# Patient Record
Sex: Female | Born: 1945 | Race: White | Hispanic: No | Marital: Married
Health system: Southern US, Community
[De-identification: ages and names within clinical notes are randomized; demographics above are authoritative.]

## PROBLEM LIST (undated history)

## (undated) DIAGNOSIS — M2540 Effusion, unspecified joint: Secondary | ICD-10-CM

## (undated) DIAGNOSIS — I2510 Atherosclerotic heart disease of native coronary artery without angina pectoris: Secondary | ICD-10-CM

## (undated) DIAGNOSIS — R351 Nocturia: Secondary | ICD-10-CM

## (undated) DIAGNOSIS — C189 Malignant neoplasm of colon, unspecified: Secondary | ICD-10-CM

## (undated) DIAGNOSIS — T4145XA Adverse effect of unspecified anesthetic, initial encounter: Secondary | ICD-10-CM

## (undated) DIAGNOSIS — G4730 Sleep apnea, unspecified: Secondary | ICD-10-CM

## (undated) DIAGNOSIS — J449 Chronic obstructive pulmonary disease, unspecified: Secondary | ICD-10-CM

## (undated) DIAGNOSIS — I2699 Other pulmonary embolism without acute cor pulmonale: Secondary | ICD-10-CM

## (undated) DIAGNOSIS — I10 Essential (primary) hypertension: Secondary | ICD-10-CM

## (undated) DIAGNOSIS — L259 Unspecified contact dermatitis, unspecified cause: Secondary | ICD-10-CM

## (undated) DIAGNOSIS — N3289 Other specified disorders of bladder: Secondary | ICD-10-CM

## (undated) DIAGNOSIS — G4700 Insomnia, unspecified: Secondary | ICD-10-CM

## (undated) DIAGNOSIS — F419 Anxiety disorder, unspecified: Secondary | ICD-10-CM

## (undated) DIAGNOSIS — Z8709 Personal history of other diseases of the respiratory system: Secondary | ICD-10-CM

## (undated) DIAGNOSIS — R51 Headache: Secondary | ICD-10-CM

## (undated) DIAGNOSIS — G8929 Other chronic pain: Secondary | ICD-10-CM

## (undated) DIAGNOSIS — Z9289 Personal history of other medical treatment: Secondary | ICD-10-CM

## (undated) DIAGNOSIS — F410 Panic disorder [episodic paroxysmal anxiety] without agoraphobia: Secondary | ICD-10-CM

## (undated) DIAGNOSIS — M1990 Unspecified osteoarthritis, unspecified site: Secondary | ICD-10-CM

## (undated) DIAGNOSIS — R609 Edema, unspecified: Secondary | ICD-10-CM

## (undated) DIAGNOSIS — R011 Cardiac murmur, unspecified: Secondary | ICD-10-CM

## (undated) DIAGNOSIS — R600 Localized edema: Secondary | ICD-10-CM

## (undated) DIAGNOSIS — T8859XA Other complications of anesthesia, initial encounter: Secondary | ICD-10-CM

## (undated) DIAGNOSIS — Z8614 Personal history of Methicillin resistant Staphylococcus aureus infection: Secondary | ICD-10-CM

## (undated) DIAGNOSIS — G2581 Restless legs syndrome: Secondary | ICD-10-CM

## (undated) DIAGNOSIS — R519 Headache, unspecified: Secondary | ICD-10-CM

## (undated) DIAGNOSIS — M549 Dorsalgia, unspecified: Secondary | ICD-10-CM

## (undated) DIAGNOSIS — M2550 Pain in unspecified joint: Secondary | ICD-10-CM

## (undated) DIAGNOSIS — M21372 Foot drop, left foot: Secondary | ICD-10-CM

## (undated) DIAGNOSIS — D649 Anemia, unspecified: Secondary | ICD-10-CM

## (undated) HISTORY — DX: Essential (primary) hypertension: I10

## (undated) HISTORY — DX: Chronic obstructive pulmonary disease, unspecified: J44.9

## (undated) HISTORY — PX: JOINT REPLACEMENT: SHX530

## (undated) HISTORY — PX: COLONOSCOPY: SHX174

## (undated) HISTORY — DX: Sleep apnea, unspecified: G47.30

## (undated) HISTORY — PX: CHOLECYSTECTOMY: SHX55

## (undated) HISTORY — PX: ABDOMINAL HYSTERECTOMY: SHX81

## (undated) HISTORY — PX: OTHER SURGICAL HISTORY: SHX169

## (undated) HISTORY — DX: Anemia, unspecified: D64.9

## (undated) HISTORY — PX: BREAST EXCISIONAL BIOPSY: SUR124

## (undated) HISTORY — PX: BACK SURGERY: SHX140

## (undated) HISTORY — DX: Malignant neoplasm of colon, unspecified: C18.9

## (undated) HISTORY — PX: COLON SURGERY: SHX602

## (undated) HISTORY — PX: TOTAL HIP ARTHROPLASTY: SHX124

---

## 2003-12-16 ENCOUNTER — Other Ambulatory Visit: Payer: Self-pay

## 2004-02-12 ENCOUNTER — Other Ambulatory Visit: Payer: Self-pay

## 2004-10-03 ENCOUNTER — Emergency Department: Payer: Self-pay | Admitting: Emergency Medicine

## 2004-11-05 ENCOUNTER — Ambulatory Visit: Payer: Self-pay | Admitting: Unknown Physician Specialty

## 2004-11-08 ENCOUNTER — Ambulatory Visit: Payer: Self-pay | Admitting: Unknown Physician Specialty

## 2005-09-22 ENCOUNTER — Other Ambulatory Visit: Payer: Self-pay

## 2005-09-22 ENCOUNTER — Emergency Department: Payer: Self-pay | Admitting: Internal Medicine

## 2005-09-24 ENCOUNTER — Ambulatory Visit: Payer: Self-pay | Admitting: Internal Medicine

## 2005-11-19 ENCOUNTER — Inpatient Hospital Stay (HOSPITAL_COMMUNITY): Admission: RE | Admit: 2005-11-19 | Discharge: 2005-11-23 | Payer: Self-pay | Admitting: Orthopaedic Surgery

## 2005-12-17 ENCOUNTER — Ambulatory Visit (HOSPITAL_COMMUNITY): Admission: RE | Admit: 2005-12-17 | Discharge: 2005-12-17 | Payer: Self-pay | Admitting: Orthopaedic Surgery

## 2005-12-17 ENCOUNTER — Encounter: Payer: Self-pay | Admitting: Vascular Surgery

## 2008-01-01 ENCOUNTER — Ambulatory Visit: Payer: Self-pay | Admitting: Unknown Physician Specialty

## 2008-03-13 ENCOUNTER — Emergency Department: Payer: Self-pay | Admitting: Emergency Medicine

## 2008-03-13 ENCOUNTER — Other Ambulatory Visit: Payer: Self-pay

## 2008-07-19 ENCOUNTER — Ambulatory Visit: Payer: Self-pay | Admitting: Family Medicine

## 2008-08-30 ENCOUNTER — Ambulatory Visit: Payer: Self-pay | Admitting: Family Medicine

## 2008-12-06 ENCOUNTER — Emergency Department: Payer: Self-pay | Admitting: Emergency Medicine

## 2009-01-19 ENCOUNTER — Emergency Department: Payer: Self-pay | Admitting: Emergency Medicine

## 2009-01-22 ENCOUNTER — Emergency Department: Payer: Self-pay | Admitting: Emergency Medicine

## 2009-04-04 ENCOUNTER — Ambulatory Visit: Payer: Self-pay | Admitting: Orthopaedic Surgery

## 2009-04-06 ENCOUNTER — Ambulatory Visit: Payer: Self-pay | Admitting: Orthopaedic Surgery

## 2009-08-25 ENCOUNTER — Encounter (INDEPENDENT_AMBULATORY_CARE_PROVIDER_SITE_OTHER): Payer: Self-pay | Admitting: Cardiology

## 2009-08-25 ENCOUNTER — Ambulatory Visit (HOSPITAL_COMMUNITY): Admission: RE | Admit: 2009-08-25 | Discharge: 2009-08-25 | Payer: Self-pay | Admitting: Cardiology

## 2009-08-25 ENCOUNTER — Ambulatory Visit: Payer: Self-pay | Admitting: Vascular Surgery

## 2009-12-19 ENCOUNTER — Emergency Department: Payer: Self-pay | Admitting: Emergency Medicine

## 2009-12-25 ENCOUNTER — Emergency Department: Payer: Self-pay | Admitting: Emergency Medicine

## 2010-01-01 ENCOUNTER — Institutional Professional Consult (permissible substitution): Admission: AD | Admit: 2010-01-01 | Discharge: 2010-01-22 | Payer: Self-pay | Admitting: Internal Medicine

## 2010-01-04 ENCOUNTER — Ambulatory Visit: Payer: Self-pay | Admitting: Internal Medicine

## 2010-01-22 ENCOUNTER — Encounter: Payer: Self-pay | Admitting: Internal Medicine

## 2010-02-05 ENCOUNTER — Encounter: Payer: Self-pay | Admitting: Internal Medicine

## 2010-04-09 ENCOUNTER — Inpatient Hospital Stay: Payer: Self-pay | Admitting: Orthopedic Surgery

## 2010-05-01 ENCOUNTER — Observation Stay: Payer: Self-pay | Admitting: General Practice

## 2010-09-23 LAB — DIFFERENTIAL
Basophils Relative: 0 % (ref 0–1)
Eosinophils Absolute: 0.5 10*3/uL (ref 0.0–0.7)
Lymphocytes Relative: 25 % (ref 12–46)
Lymphs Abs: 1.9 10*3/uL (ref 0.7–4.0)
Monocytes Absolute: 0.6 10*3/uL (ref 0.1–1.0)
Monocytes Relative: 8 % (ref 3–12)
Neutrophils Relative %: 60 % (ref 43–77)

## 2010-09-23 LAB — COMPREHENSIVE METABOLIC PANEL
AST: 18 U/L (ref 0–37)
Albumin: 2.7 g/dL — ABNORMAL LOW (ref 3.5–5.2)
Albumin: 2.6 g/dL — ABNORMAL LOW (ref 3.5–5.2)
Alkaline Phosphatase: 79 U/L (ref 39–117)
BUN: 12 mg/dL (ref 6–23)
BUN: 7 mg/dL (ref 6–23)
CO2: 30 mEq/L (ref 19–32)
Chloride: 101 mEq/L (ref 96–112)
GFR calc Af Amer: >60 mL/min (ref >60)
GFR calc non Af Amer: >60 mL/min (ref >60)
Glucose, Bld: 107 mg/dL — ABNORMAL HIGH (ref 70–99)
Glucose, Bld: 97 mg/dL (ref 70–99)
Potassium: 4.3 mEq/L (ref 3.5–5.1)
Potassium: 3.9 mEq/L (ref 3.5–5.1)
Sodium: 137 mEq/L (ref 135–145)
Total Bilirubin: 0.5 mg/dL (ref 0.3–1.2)
Total Protein: 6.5 g/dL (ref 6.0–8.3)

## 2010-09-23 LAB — PROTIME-INR
INR: 1.12 (ref 0.00–1.49)
INR: 1.16 (ref 0.00–1.49)
Prothrombin Time: 14.3 seconds (ref 11.6–15.2)
Prothrombin Time: 14.7 seconds (ref 11.6–15.2)
Prothrombin Time: 16 seconds — ABNORMAL HIGH (ref 11.6–15.2)

## 2010-09-23 LAB — CBC
HCT: 27.6 % — ABNORMAL LOW (ref 36.0–46.0)
MCHC: 33.4 g/dL (ref 30.0–36.0)
MCV: 89.7 fL (ref 78.0–100.0)
RDW: 14.5 % (ref 11.5–15.5)

## 2010-09-23 LAB — BASIC METABOLIC PANEL
Chloride: 101 mEq/L (ref 96–112)
GFR calc Af Amer: >60 mL/min (ref >60)
Potassium: 5 mEq/L (ref 3.5–5.1)

## 2010-09-23 LAB — IRON AND TIBC: Iron: 26 ug/dL — ABNORMAL LOW (ref 42–135)

## 2010-09-23 LAB — HEMOGLOBIN A1C
Hgb A1c MFr Bld: 5.5 % (ref <5.7)
Mean Plasma Glucose: 111 mg/dL (ref <117)

## 2010-09-23 LAB — FOLATE: Folate: 5.9 ng/mL

## 2010-09-23 LAB — MAGNESIUM: Magnesium: 1.8 mg/dL (ref 1.5–2.5)

## 2010-09-23 LAB — RETICULOCYTES
RBC.: 3.34 MIL/uL — ABNORMAL LOW (ref 3.87–5.11)
Retic Ct Pct: 2.7 % (ref 0.4–3.1)

## 2010-09-23 LAB — PREALBUMIN: Prealbumin: 14.1 mg/dL — ABNORMAL LOW (ref 18.0–45.0)

## 2010-11-23 NOTE — Op Note (Signed)
NAMEJOZEY, JANCO NO.:  [id number]   MEDICAL RECORD NO.:  [id number]          PATIENT TYPE:  INP   LOCATION:  2899                         FACILITY:  [facility]   PHYSICIAN:  Lubertha Basque. Dalldorf, M.D.DATE OF BIRTH:  31-Mar-1946   DATE OF PROCEDURE:  11/19/2005  DATE OF DISCHARGE:                                 OPERATIVE REPORT   PREOPERATIVE DIAGNOSIS:  Left hip degenerative arthritis.   POSTOPERATIVE DIAGNOSIS:  Left hip degenerative arthritis.   PROCEDURE:  Left total hip replacement.   ANESTHESIA:  General.   SURGEON:  Lubertha Basque. Jerl Santos, M.D.   ASSISTANT:  Lindwood Qua, P.A.-C.   INDICATIONS FOR PROCEDURE:  The patient is a 65 year old woman with a long  history of progressively worsening left hip pain.  She is status post a  successful back procedure which has alleviated some significant back  discomfort, but she has been left with left hip and groin pain.  By x-ray,  she has end stage degenerative change.  She has pain which limits her  ability to rest and walk and she has failed oral anti-inflammatories and  rest.  She is offered a hip replacement operation.  Informed operative  consent was obtained after a discussion of the possible complications of  reaction to anesthesia, infection, DVT, PE, dislocation, and death.   SUMMARY:  Through a posterior approach, a left total hip replacement was  performed.  She had excellent bone quality and we performed a porous  ingrowth replacement using the DePuy ASR System.  I placed a 4 high offset  Summit stem in appropriate anteversion with a size 46 ASR DePuy cup in  appropriate anteversion and tilt.  We then used a +2 taper with a 41 mm ASR  cobalt chrome head.  Bryna Colander assisted throughout with retraction and  closure and helped to significantly minimize surgical time.   DESCRIPTION OF PROCEDURE:  The patient went to the operating suite where  general anesthetic was applied without difficulty.  She  was positioned in  the lateral decubitus position with left the hip up.  Hip positioners were  used and an axillary roll was placed.  All bony prominences were  appropriately padded.  She was then prepped and draped in a normal sterile  fashion.  After the administration of preop IV Kefzol, a posterior approach  was taken to the left hip.  All appropriate anti-infective measures were  used including the preoperative IV antibiotic, Betadine impregnated drape,  and closed hooded exhaust systems for each member of the surgical team.  Dissection was carried through an abundance of adipose tissue to the IT band  which was incised longitudinally along with the fascia of the gluteus  maximus.  A deep Charnley was placed followed by reflection of the short  external rotators which were tagged.  A posterior capsulectomy was performed  and the hip was dislocated.  She had a fairly short femoral neck.  A neck  cut was made with an oscillating saw just above the lesser trochanter.  The  acetabulum was then fully exposed.  She had a small  hip joint and we took  medial reaming with a 43 reamer to the inside wall of the pelvis.  I then  sequentially reamed up to 45 at which point she had good bleeding bone and  good coverage of the reamer.  We elected to use the ASR System.  I placed a  size 46 DePuy ASR cup in appropriate anteversion and tilt.  We then turned  our attention towards the femur.  This was exposed completely.  I used a  lateralizing reamer and reamed up to a size 4 followed by broaching to a  size 4.  We then trialed up this stem with various components and the +2  high offset assembly seemed to be provide [place] the best stability and equality  of leg length.  The trial component was removed followed by placement of a  size 4 high offset Summit DePuy stem in appropriate anteversion.  We then  placed a +2 taper and a 41 mm ASR cobalt chrome head by DePuy.  The hip was  again reduced and was  stable in extension with external rotation and flexion  with internal rotation.  Her leg lengths were judged to be roughly equal.  The wound was irrigated followed by reapproximation of the short external  rotators to the greater trochanteric region with nonabsorbable suture.  The  IT band and gluteus maximus fascia were then reapproximated with #1 Vicryl  in an interrupted fashion.  Adipose was reapproximated in several layers to  eliminate dead space and subcutaneous tissues were brought together with 2-0  undyed Vicryl.  The skin was closed with staples followed by Adaptic with a  dry gauze dressing and tape.  Estimated blood loss was 150cc and  interoperative fluids can be obtained from anesthesia records.   DISPOSITION:  The patient was extubated in the operating room and taken to  the recovery room in stable addition.  The plans were for her to be admitted  to the orthopedic surgery service for appropriate postop care to include  perioperative antibiotics and Coumadin plus Lovenox for DVT prophylaxis.      Lubertha Basque Jerl Santos, M.D.  Electronically Signed     PGD/MEDQ  D:  11/19/2005  T:  11/19/2005  Job:  [id number]

## 2010-11-23 NOTE — Discharge Summary (Signed)
Ann Silva, Ann Silva NO.:  [id number]   MEDICAL RECORD NO.:  [id number]          PATIENT TYPE:  INP   LOCATION:  5020                         FACILITY:  [facility]   PHYSICIAN:  Ann Basque. Dalldorf, M.D.DATE OF BIRTH:  1946/06/29   DATE OF ADMISSION:  11/19/2005  DATE OF DISCHARGE:  11/23/2005                                 DISCHARGE SUMMARY   ADMISSION DIAGNOSES:  1.  Left hip end-stage degenerative joint disease.  2.  Hypertension.  3.  History of depression.   DISCHARGE DIAGNOSES:  1.  Left hip end-stage degenerative joint disease.  2.  Hypertension.  3.  History of depression.   OPERATION:  Left total hip replacement.   HISTORY:  Briefly, this patient is a 65 year old white female patient, well-  known to our practice, who is having worsening left hip pain.  She is having  almost no rotation, painful ambulation and trouble sleeping at nighttime.  X-  rays revealed end-stage DJD of her left hip.  We have discussed treatment  option, that being a left total hip replacement, the risks of anesthesia,  infection, DVT and possible death.   LABORATORY DATA:  Pertinent laboratory and x-ray findings:  EKG showed  normal sinus rhythm.  White blood cell count is 8.5, hemoglobin 13.8 with a  drop to 9.3 postoperatively, hematocrit 27.1, platelet count at 220,000.  INRs done serially showed on low-dose Coumadin protocol last testing 2.1.  Sodium 135, potassium fluctuated, both 3.5 down to 3.2, glucose 107, BUN 5  and creatinine 0.6, calcium 8.0.   HOSPITAL COURSE:  She was admitted postoperatively, was on Ancef 1 gram  times three doses, lactated Ringer's IV, PCA morphine pump, and on  appropriate laxatives, and then Coumadin protocol and Lovenox protocol for  DVT prophylaxis.  Her medicine reconciliation sheet was also signed and she  was kept on her home medications, which will be outlined later, Percocet by  mouth for pain, Reglan for nausea.  She was able to be up  and out of bed,  touchdown weightbearing with the help of physical therapy.  Knee immobilizer  on at nighttime, knee-high stockings and incentive spirometry was also  ordered.  The first day post-op, her blood pressure was 97/64, temperature  was 102, hemoglobin 11.1, potassium 3.3, INR 1.0.  She had positive breath  sounds.  Abdomen was soft.  Hip wound was benign.  We were going to continue  her IV fluids, get her up and out of bed with physical therapy to be  touchdown weightbearing.  On the second day post-op, her blood pressure was  better at 129/76.  Abdomen continued soft.  Lungs continued clear.  Her  dressing was changed.  Her wound was benign.  No signs of infection or  irritation and she was progressing well, getting up and out of bed.  Having  a little hip pain.  She was discharged home on Saturday, Nov 23, 2005.   CONDITION ON DISCHARGE:  Improved.   FOLLOW UP:  She will remain on her home medications which are:  1.  Premarin 0.9 one a  day.  2.  Potassium chloride 30 mEq daily.  3.  Zoloft 100 mg daily.  4.  Tri-HCT 525 one daily.   Additional medications would be:  1.  Percocet one or two every four to six hours p.r.n. pain.  2.  Coumadin 2 mg by mouth daily until [facility] draws a pro-time      and possibly adjusts that.  This Coumadin protocol will last for 30      days.   Dressing can be changed every two to three days at home.  If any signs of  infection, she is to call our office at [phone].  Diet is unrestricted  other than coumadin considerations.  She is to be touchdown weightbearing  with crutches or walker.  Once again, [facility] will provide home  physical therapy and blood draws.      Ann Silva, P.A.      Ann Silva, M.D.  Electronically Signed    MC/MEDQ  D:  01/07/2006  T:  01/07/2006  Job:  [id number]

## 2011-03-26 ENCOUNTER — Emergency Department: Payer: Self-pay | Admitting: Emergency Medicine

## 2011-04-02 ENCOUNTER — Inpatient Hospital Stay: Payer: Self-pay | Admitting: Internal Medicine

## 2011-04-16 ENCOUNTER — Encounter: Payer: Self-pay | Admitting: Internal Medicine

## 2011-04-26 ENCOUNTER — Emergency Department: Payer: Self-pay | Admitting: Emergency Medicine

## 2011-05-09 ENCOUNTER — Encounter: Payer: Self-pay | Admitting: Internal Medicine

## 2011-06-08 ENCOUNTER — Encounter: Payer: Self-pay | Admitting: Internal Medicine

## 2011-07-09 ENCOUNTER — Encounter: Payer: Self-pay | Admitting: Internal Medicine

## 2011-07-24 DIAGNOSIS — Z9889 Other specified postprocedural states: Secondary | ICD-10-CM | POA: Diagnosis not present

## 2011-07-24 DIAGNOSIS — Z90710 Acquired absence of both cervix and uterus: Secondary | ICD-10-CM | POA: Diagnosis not present

## 2011-07-24 DIAGNOSIS — Z882 Allergy status to sulfonamides status: Secondary | ICD-10-CM | POA: Diagnosis not present

## 2011-07-24 DIAGNOSIS — M25559 Pain in unspecified hip: Secondary | ICD-10-CM | POA: Diagnosis not present

## 2011-07-24 DIAGNOSIS — D649 Anemia, unspecified: Secondary | ICD-10-CM | POA: Diagnosis not present

## 2011-07-24 DIAGNOSIS — R52 Pain, unspecified: Secondary | ICD-10-CM | POA: Diagnosis not present

## 2011-07-24 DIAGNOSIS — M7989 Other specified soft tissue disorders: Secondary | ICD-10-CM | POA: Diagnosis not present

## 2011-07-24 DIAGNOSIS — S73006A Unspecified dislocation of unspecified hip, initial encounter: Secondary | ICD-10-CM | POA: Diagnosis not present

## 2011-07-24 DIAGNOSIS — I10 Essential (primary) hypertension: Secondary | ICD-10-CM | POA: Diagnosis not present

## 2011-07-24 DIAGNOSIS — G2581 Restless legs syndrome: Secondary | ICD-10-CM | POA: Diagnosis not present

## 2011-07-24 DIAGNOSIS — Z8619 Personal history of other infectious and parasitic diseases: Secondary | ICD-10-CM | POA: Diagnosis not present

## 2011-07-24 DIAGNOSIS — M79609 Pain in unspecified limb: Secondary | ICD-10-CM | POA: Diagnosis not present

## 2011-07-24 DIAGNOSIS — Z981 Arthrodesis status: Secondary | ICD-10-CM | POA: Diagnosis not present

## 2011-07-24 DIAGNOSIS — G8929 Other chronic pain: Secondary | ICD-10-CM | POA: Diagnosis not present

## 2011-07-24 DIAGNOSIS — Z79899 Other long term (current) drug therapy: Secondary | ICD-10-CM | POA: Diagnosis not present

## 2011-07-24 DIAGNOSIS — Z8249 Family history of ischemic heart disease and other diseases of the circulatory system: Secondary | ICD-10-CM | POA: Diagnosis not present

## 2011-07-24 DIAGNOSIS — N390 Urinary tract infection, site not specified: Secondary | ICD-10-CM | POA: Diagnosis not present

## 2011-07-24 DIAGNOSIS — Z885 Allergy status to narcotic agent status: Secondary | ICD-10-CM | POA: Diagnosis not present

## 2011-07-24 DIAGNOSIS — Z888 Allergy status to other drugs, medicaments and biological substances status: Secondary | ICD-10-CM | POA: Diagnosis not present

## 2011-07-24 DIAGNOSIS — R079 Chest pain, unspecified: Secondary | ICD-10-CM | POA: Diagnosis not present

## 2011-07-25 ENCOUNTER — Observation Stay: Payer: Self-pay | Admitting: Internal Medicine

## 2011-07-25 DIAGNOSIS — M25559 Pain in unspecified hip: Secondary | ICD-10-CM | POA: Diagnosis not present

## 2011-07-25 DIAGNOSIS — M79609 Pain in unspecified limb: Secondary | ICD-10-CM | POA: Diagnosis not present

## 2011-07-25 DIAGNOSIS — I10 Essential (primary) hypertension: Secondary | ICD-10-CM | POA: Diagnosis not present

## 2011-07-25 DIAGNOSIS — N390 Urinary tract infection, site not specified: Secondary | ICD-10-CM | POA: Diagnosis not present

## 2011-07-25 DIAGNOSIS — R079 Chest pain, unspecified: Secondary | ICD-10-CM | POA: Diagnosis not present

## 2011-07-25 LAB — URINALYSIS, COMPLETE
Bilirubin,UR: NEGATIVE
Ketone: NEGATIVE
Nitrite: POSITIVE
Protein: NEGATIVE
RBC,UR: 15 /HPF (ref 0–5)
Specific Gravity: 1.02 (ref 1.003–1.030)
Squamous Epithelial: 2
WBC UR: 22 /HPF (ref 0–5)

## 2011-07-25 LAB — CBC WITH DIFFERENTIAL/PLATELET
Basophil #: 0 10*3/uL (ref 0.0–0.1)
Eosinophil #: 0.4 10*3/uL (ref 0.0–0.7)
Eosinophil %: 6.5 %
Lymphocyte #: 1.8 10*3/uL (ref 1.0–3.6)
MCH: 28.6 pg (ref 26.0–34.0)
MCV: 87 fL (ref 80–100)
Monocyte #: 0.4 10*3/uL (ref 0.0–0.7)
Monocyte %: 6.4 %
Neutrophil #: 4 10*3/uL (ref 1.4–6.5)
Neutrophil %: 59.4 %
Platelet: 195 10*3/uL (ref 150–440)
RDW: 17.1 % — ABNORMAL HIGH (ref 11.5–14.5)
WBC: 6.8 10*3/uL (ref 3.6–11.0)

## 2011-07-25 LAB — COMPREHENSIVE METABOLIC PANEL
Albumin: 3.5 g/dL (ref 3.4–5.0)
Anion Gap: 18 — ABNORMAL HIGH (ref 7–16)
BUN: 23 mg/dL — ABNORMAL HIGH (ref 7–18)
Bilirubin,Total: 0.6 mg/dL (ref 0.2–1.0)
Chloride: 100 mmol/L (ref 98–107)
Glucose: 135 mg/dL — ABNORMAL HIGH (ref 65–99)
Potassium: 3.9 mmol/L (ref 3.5–5.1)
SGOT(AST): 24 U/L (ref 15–37)
SGPT (ALT): 21 U/L
Sodium: 143 mmol/L (ref 136–145)
Total Protein: 7.2 g/dL (ref 6.4–8.2)

## 2011-07-25 LAB — TROPONIN I: Troponin-I: <0.02 ng/mL

## 2011-07-26 DIAGNOSIS — D649 Anemia, unspecified: Secondary | ICD-10-CM | POA: Diagnosis not present

## 2011-07-26 DIAGNOSIS — M79609 Pain in unspecified limb: Secondary | ICD-10-CM | POA: Diagnosis not present

## 2011-07-26 DIAGNOSIS — N390 Urinary tract infection, site not specified: Secondary | ICD-10-CM | POA: Diagnosis not present

## 2011-07-26 DIAGNOSIS — M25559 Pain in unspecified hip: Secondary | ICD-10-CM | POA: Diagnosis not present

## 2011-07-26 LAB — BASIC METABOLIC PANEL
Anion Gap: 8 (ref 7–16)
Calcium, Total: 9.6 mg/dL (ref 8.5–10.1)
Chloride: 105 mmol/L (ref 98–107)
Co2: 28 mmol/L (ref 21–32)
Glucose: 106 mg/dL — ABNORMAL HIGH (ref 65–99)
Osmolality: 285 (ref 275–301)
Potassium: 3.7 mmol/L (ref 3.5–5.1)
Sodium: 141 mmol/L (ref 136–145)

## 2011-07-26 LAB — CBC WITH DIFFERENTIAL/PLATELET
Basophil #: 0 10*3/uL (ref 0.0–0.1)
Basophil %: 0.5 %
Eosinophil #: 0.5 10*3/uL (ref 0.0–0.7)
HCT: 35.2 % (ref 35.0–47.0)
MCH: 28.4 pg (ref 26.0–34.0)
MCV: 86 fL (ref 80–100)
Monocyte #: 0.2 10*3/uL (ref 0.0–0.7)
Neutrophil #: 3.1 10*3/uL (ref 1.4–6.5)
Platelet: 171 10*3/uL (ref 150–440)
RDW: 17 % — ABNORMAL HIGH (ref 11.5–14.5)
WBC: 5 10*3/uL (ref 3.6–11.0)

## 2011-07-29 DIAGNOSIS — M25459 Effusion, unspecified hip: Secondary | ICD-10-CM | POA: Diagnosis not present

## 2011-07-29 DIAGNOSIS — T8489XA Other specified complication of internal orthopedic prosthetic devices, implants and grafts, initial encounter: Secondary | ICD-10-CM | POA: Diagnosis not present

## 2011-07-29 DIAGNOSIS — M25559 Pain in unspecified hip: Secondary | ICD-10-CM | POA: Diagnosis not present

## 2011-07-30 DIAGNOSIS — M25459 Effusion, unspecified hip: Secondary | ICD-10-CM | POA: Diagnosis not present

## 2011-07-30 DIAGNOSIS — T8489XA Other specified complication of internal orthopedic prosthetic devices, implants and grafts, initial encounter: Secondary | ICD-10-CM | POA: Diagnosis not present

## 2011-07-31 DIAGNOSIS — M7989 Other specified soft tissue disorders: Secondary | ICD-10-CM | POA: Diagnosis not present

## 2011-08-01 DIAGNOSIS — T8450XA Infection and inflammatory reaction due to unspecified internal joint prosthesis, initial encounter: Secondary | ICD-10-CM | POA: Diagnosis not present

## 2011-08-09 ENCOUNTER — Encounter: Payer: Self-pay | Admitting: Internal Medicine

## 2011-08-14 DIAGNOSIS — Z01818 Encounter for other preprocedural examination: Secondary | ICD-10-CM | POA: Diagnosis not present

## 2011-08-14 DIAGNOSIS — T84498A Other mechanical complication of other internal orthopedic devices, implants and grafts, initial encounter: Secondary | ICD-10-CM | POA: Diagnosis not present

## 2011-08-22 DIAGNOSIS — Z885 Allergy status to narcotic agent status: Secondary | ICD-10-CM | POA: Diagnosis not present

## 2011-08-22 DIAGNOSIS — Z882 Allergy status to sulfonamides status: Secondary | ICD-10-CM | POA: Diagnosis not present

## 2011-08-22 DIAGNOSIS — I2789 Other specified pulmonary heart diseases: Secondary | ICD-10-CM | POA: Diagnosis present

## 2011-08-22 DIAGNOSIS — R209 Unspecified disturbances of skin sensation: Secondary | ICD-10-CM | POA: Diagnosis present

## 2011-08-22 DIAGNOSIS — D62 Acute posthemorrhagic anemia: Secondary | ICD-10-CM | POA: Diagnosis not present

## 2011-08-22 DIAGNOSIS — G4733 Obstructive sleep apnea (adult) (pediatric): Secondary | ICD-10-CM | POA: Diagnosis present

## 2011-08-22 DIAGNOSIS — G2581 Restless legs syndrome: Secondary | ICD-10-CM | POA: Diagnosis present

## 2011-08-22 DIAGNOSIS — E669 Obesity, unspecified: Secondary | ICD-10-CM | POA: Diagnosis not present

## 2011-08-22 DIAGNOSIS — D649 Anemia, unspecified: Secondary | ICD-10-CM | POA: Diagnosis not present

## 2011-08-22 DIAGNOSIS — Z5189 Encounter for other specified aftercare: Secondary | ICD-10-CM | POA: Diagnosis not present

## 2011-08-22 DIAGNOSIS — M25559 Pain in unspecified hip: Secondary | ICD-10-CM | POA: Diagnosis not present

## 2011-08-22 DIAGNOSIS — Z96649 Presence of unspecified artificial hip joint: Secondary | ICD-10-CM | POA: Diagnosis not present

## 2011-08-22 DIAGNOSIS — Z471 Aftercare following joint replacement surgery: Secondary | ICD-10-CM | POA: Diagnosis not present

## 2011-08-22 DIAGNOSIS — G8918 Other acute postprocedural pain: Secondary | ICD-10-CM | POA: Diagnosis not present

## 2011-08-22 DIAGNOSIS — K219 Gastro-esophageal reflux disease without esophagitis: Secondary | ICD-10-CM | POA: Diagnosis present

## 2011-08-22 DIAGNOSIS — M11859 Other specified crystal arthropathies, unspecified hip: Secondary | ICD-10-CM | POA: Diagnosis not present

## 2011-08-22 DIAGNOSIS — R269 Unspecified abnormalities of gait and mobility: Secondary | ICD-10-CM | POA: Diagnosis not present

## 2011-08-22 DIAGNOSIS — M21859 Other specified acquired deformities of unspecified thigh: Secondary | ICD-10-CM | POA: Diagnosis present

## 2011-08-22 DIAGNOSIS — Z87891 Personal history of nicotine dependence: Secondary | ICD-10-CM | POA: Diagnosis not present

## 2011-08-22 DIAGNOSIS — R5381 Other malaise: Secondary | ICD-10-CM | POA: Diagnosis not present

## 2011-08-22 DIAGNOSIS — Z6834 Body mass index (BMI) 34.0-34.9, adult: Secondary | ICD-10-CM | POA: Diagnosis not present

## 2011-08-22 DIAGNOSIS — Z9889 Other specified postprocedural states: Secondary | ICD-10-CM | POA: Diagnosis not present

## 2011-08-22 DIAGNOSIS — S72143A Displaced intertrochanteric fracture of unspecified femur, initial encounter for closed fracture: Secondary | ICD-10-CM | POA: Diagnosis not present

## 2011-08-22 DIAGNOSIS — Z981 Arthrodesis status: Secondary | ICD-10-CM | POA: Diagnosis not present

## 2011-08-22 DIAGNOSIS — I10 Essential (primary) hypertension: Secondary | ICD-10-CM | POA: Diagnosis not present

## 2011-08-22 DIAGNOSIS — T8450XA Infection and inflammatory reaction due to unspecified internal joint prosthesis, initial encounter: Secondary | ICD-10-CM | POA: Diagnosis not present

## 2011-08-22 DIAGNOSIS — N318 Other neuromuscular dysfunction of bladder: Secondary | ICD-10-CM | POA: Diagnosis not present

## 2011-08-22 DIAGNOSIS — I9589 Other hypotension: Secondary | ICD-10-CM | POA: Diagnosis not present

## 2011-08-22 DIAGNOSIS — M6281 Muscle weakness (generalized): Secondary | ICD-10-CM | POA: Diagnosis not present

## 2011-08-22 DIAGNOSIS — A4901 Methicillin susceptible Staphylococcus aureus infection, unspecified site: Secondary | ICD-10-CM | POA: Diagnosis not present

## 2011-08-22 DIAGNOSIS — Z85038 Personal history of other malignant neoplasm of large intestine: Secondary | ICD-10-CM | POA: Diagnosis not present

## 2011-08-22 DIAGNOSIS — F410 Panic disorder [episodic paroxysmal anxiety] without agoraphobia: Secondary | ICD-10-CM | POA: Diagnosis present

## 2011-08-27 DIAGNOSIS — N318 Other neuromuscular dysfunction of bladder: Secondary | ICD-10-CM | POA: Diagnosis not present

## 2011-08-27 DIAGNOSIS — G2581 Restless legs syndrome: Secondary | ICD-10-CM | POA: Diagnosis not present

## 2011-08-27 DIAGNOSIS — I10 Essential (primary) hypertension: Secondary | ICD-10-CM | POA: Diagnosis not present

## 2011-08-27 DIAGNOSIS — Z96649 Presence of unspecified artificial hip joint: Secondary | ICD-10-CM | POA: Diagnosis not present

## 2011-08-27 DIAGNOSIS — D649 Anemia, unspecified: Secondary | ICD-10-CM | POA: Diagnosis not present

## 2011-08-27 DIAGNOSIS — Z471 Aftercare following joint replacement surgery: Secondary | ICD-10-CM | POA: Diagnosis not present

## 2011-08-27 DIAGNOSIS — Z5189 Encounter for other specified aftercare: Secondary | ICD-10-CM | POA: Diagnosis not present

## 2011-08-27 DIAGNOSIS — R5381 Other malaise: Secondary | ICD-10-CM | POA: Diagnosis not present

## 2011-08-27 DIAGNOSIS — M1909 Primary osteoarthritis, other specified site: Secondary | ICD-10-CM | POA: Diagnosis not present

## 2011-08-27 DIAGNOSIS — R269 Unspecified abnormalities of gait and mobility: Secondary | ICD-10-CM | POA: Diagnosis not present

## 2011-08-27 DIAGNOSIS — M6281 Muscle weakness (generalized): Secondary | ICD-10-CM | POA: Diagnosis not present

## 2011-08-27 DIAGNOSIS — M25559 Pain in unspecified hip: Secondary | ICD-10-CM | POA: Diagnosis not present

## 2011-08-29 DIAGNOSIS — M1909 Primary osteoarthritis, other specified site: Secondary | ICD-10-CM | POA: Diagnosis not present

## 2011-08-29 DIAGNOSIS — I10 Essential (primary) hypertension: Secondary | ICD-10-CM | POA: Diagnosis not present

## 2011-08-29 LAB — PROTIME-INR
INR: 1.7
Prothrombin Time: 20.2 secs — ABNORMAL HIGH (ref 11.5–14.7)

## 2011-09-06 ENCOUNTER — Encounter: Payer: Self-pay | Admitting: Internal Medicine

## 2011-09-08 DIAGNOSIS — D649 Anemia, unspecified: Secondary | ICD-10-CM | POA: Diagnosis not present

## 2011-09-08 DIAGNOSIS — F3289 Other specified depressive episodes: Secondary | ICD-10-CM | POA: Diagnosis not present

## 2011-09-08 DIAGNOSIS — Z4682 Encounter for fitting and adjustment of non-vascular catheter: Secondary | ICD-10-CM | POA: Diagnosis not present

## 2011-09-08 DIAGNOSIS — Z96649 Presence of unspecified artificial hip joint: Secondary | ICD-10-CM | POA: Diagnosis not present

## 2011-09-08 DIAGNOSIS — F329 Major depressive disorder, single episode, unspecified: Secondary | ICD-10-CM | POA: Diagnosis not present

## 2011-09-08 DIAGNOSIS — Z7901 Long term (current) use of anticoagulants: Secondary | ICD-10-CM | POA: Diagnosis not present

## 2011-09-11 DIAGNOSIS — Z7901 Long term (current) use of anticoagulants: Secondary | ICD-10-CM | POA: Diagnosis not present

## 2011-09-11 DIAGNOSIS — Z4682 Encounter for fitting and adjustment of non-vascular catheter: Secondary | ICD-10-CM | POA: Diagnosis not present

## 2011-09-11 DIAGNOSIS — D649 Anemia, unspecified: Secondary | ICD-10-CM | POA: Diagnosis not present

## 2011-09-11 DIAGNOSIS — F329 Major depressive disorder, single episode, unspecified: Secondary | ICD-10-CM | POA: Diagnosis not present

## 2011-09-11 DIAGNOSIS — Z96649 Presence of unspecified artificial hip joint: Secondary | ICD-10-CM | POA: Diagnosis not present

## 2011-09-11 DIAGNOSIS — F3289 Other specified depressive episodes: Secondary | ICD-10-CM | POA: Diagnosis not present

## 2011-09-16 DIAGNOSIS — T8450XA Infection and inflammatory reaction due to unspecified internal joint prosthesis, initial encounter: Secondary | ICD-10-CM | POA: Diagnosis not present

## 2011-09-17 DIAGNOSIS — Z7901 Long term (current) use of anticoagulants: Secondary | ICD-10-CM | POA: Diagnosis not present

## 2011-09-17 DIAGNOSIS — Z96649 Presence of unspecified artificial hip joint: Secondary | ICD-10-CM | POA: Diagnosis not present

## 2011-09-17 DIAGNOSIS — Z4682 Encounter for fitting and adjustment of non-vascular catheter: Secondary | ICD-10-CM | POA: Diagnosis not present

## 2011-09-17 DIAGNOSIS — F3289 Other specified depressive episodes: Secondary | ICD-10-CM | POA: Diagnosis not present

## 2011-09-17 DIAGNOSIS — F329 Major depressive disorder, single episode, unspecified: Secondary | ICD-10-CM | POA: Diagnosis not present

## 2011-09-17 DIAGNOSIS — D649 Anemia, unspecified: Secondary | ICD-10-CM | POA: Diagnosis not present

## 2011-09-18 DIAGNOSIS — Z7901 Long term (current) use of anticoagulants: Secondary | ICD-10-CM | POA: Diagnosis not present

## 2011-09-18 DIAGNOSIS — F329 Major depressive disorder, single episode, unspecified: Secondary | ICD-10-CM | POA: Diagnosis not present

## 2011-09-18 DIAGNOSIS — D649 Anemia, unspecified: Secondary | ICD-10-CM | POA: Diagnosis not present

## 2011-09-18 DIAGNOSIS — Z4682 Encounter for fitting and adjustment of non-vascular catheter: Secondary | ICD-10-CM | POA: Diagnosis not present

## 2011-09-18 DIAGNOSIS — F3289 Other specified depressive episodes: Secondary | ICD-10-CM | POA: Diagnosis not present

## 2011-09-18 DIAGNOSIS — Z96649 Presence of unspecified artificial hip joint: Secondary | ICD-10-CM | POA: Diagnosis not present

## 2011-09-23 ENCOUNTER — Emergency Department: Payer: Self-pay | Admitting: Emergency Medicine

## 2011-09-23 DIAGNOSIS — R109 Unspecified abdominal pain: Secondary | ICD-10-CM | POA: Diagnosis not present

## 2011-09-23 DIAGNOSIS — R609 Edema, unspecified: Secondary | ICD-10-CM | POA: Diagnosis not present

## 2011-09-23 DIAGNOSIS — M79609 Pain in unspecified limb: Secondary | ICD-10-CM | POA: Diagnosis not present

## 2011-09-23 DIAGNOSIS — M7989 Other specified soft tissue disorders: Secondary | ICD-10-CM | POA: Diagnosis not present

## 2011-09-23 LAB — URINALYSIS, COMPLETE
Bilirubin,UR: NEGATIVE
Glucose,UR: NEGATIVE mg/dL (ref 0–75)
Ketone: NEGATIVE
Nitrite: NEGATIVE
Ph: 5 (ref 4.5–8.0)
Protein: NEGATIVE
Specific Gravity: 1.021 (ref 1.003–1.030)
WBC UR: 1 /HPF (ref 0–5)

## 2011-09-23 LAB — COMPREHENSIVE METABOLIC PANEL
Albumin: 4.2 g/dL (ref 3.4–5.0)
Alkaline Phosphatase: 102 U/L (ref 50–136)
BUN: 23 mg/dL — ABNORMAL HIGH (ref 7–18)
Bilirubin,Total: 0.5 mg/dL (ref 0.2–1.0)
Calcium, Total: 10.3 mg/dL — ABNORMAL HIGH (ref 8.5–10.1)
Chloride: 104 mmol/L (ref 98–107)
Co2: 26 mmol/L (ref 21–32)
Creatinine: 0.55 mg/dL — ABNORMAL LOW (ref 0.60–1.30)
EGFR (African American): >60
EGFR (Non-African Amer.): >60
Potassium: 4 mmol/L (ref 3.5–5.1)
SGOT(AST): 32 U/L (ref 15–37)
SGPT (ALT): 18 U/L
Total Protein: 7.9 g/dL (ref 6.4–8.2)

## 2011-09-23 LAB — CBC
HCT: 32.3 % — ABNORMAL LOW (ref 35.0–47.0)
MCHC: 33.3 g/dL (ref 32.0–36.0)
Platelet: 201 10*3/uL (ref 150–440)
RDW: 16.1 % — ABNORMAL HIGH (ref 11.5–14.5)

## 2011-09-24 DIAGNOSIS — Z96649 Presence of unspecified artificial hip joint: Secondary | ICD-10-CM | POA: Diagnosis not present

## 2011-09-24 DIAGNOSIS — Z7901 Long term (current) use of anticoagulants: Secondary | ICD-10-CM | POA: Diagnosis not present

## 2011-09-24 DIAGNOSIS — Z4682 Encounter for fitting and adjustment of non-vascular catheter: Secondary | ICD-10-CM | POA: Diagnosis not present

## 2011-09-24 DIAGNOSIS — F329 Major depressive disorder, single episode, unspecified: Secondary | ICD-10-CM | POA: Diagnosis not present

## 2011-09-24 DIAGNOSIS — D649 Anemia, unspecified: Secondary | ICD-10-CM | POA: Diagnosis not present

## 2011-09-24 DIAGNOSIS — F3289 Other specified depressive episodes: Secondary | ICD-10-CM | POA: Diagnosis not present

## 2011-09-29 LAB — CULTURE, BLOOD (SINGLE)

## 2011-10-03 DIAGNOSIS — Z4682 Encounter for fitting and adjustment of non-vascular catheter: Secondary | ICD-10-CM | POA: Diagnosis not present

## 2011-10-03 DIAGNOSIS — Z7901 Long term (current) use of anticoagulants: Secondary | ICD-10-CM | POA: Diagnosis not present

## 2011-10-03 DIAGNOSIS — F3289 Other specified depressive episodes: Secondary | ICD-10-CM | POA: Diagnosis not present

## 2011-10-03 DIAGNOSIS — F329 Major depressive disorder, single episode, unspecified: Secondary | ICD-10-CM | POA: Diagnosis not present

## 2011-10-03 DIAGNOSIS — D649 Anemia, unspecified: Secondary | ICD-10-CM | POA: Diagnosis not present

## 2011-10-03 DIAGNOSIS — Z96649 Presence of unspecified artificial hip joint: Secondary | ICD-10-CM | POA: Diagnosis not present

## 2011-10-07 ENCOUNTER — Encounter: Payer: Self-pay | Admitting: Internal Medicine

## 2011-10-16 ENCOUNTER — Emergency Department: Payer: Self-pay | Admitting: Emergency Medicine

## 2011-10-16 DIAGNOSIS — Z888 Allergy status to other drugs, medicaments and biological substances status: Secondary | ICD-10-CM | POA: Diagnosis not present

## 2011-10-16 DIAGNOSIS — S79919A Unspecified injury of unspecified hip, initial encounter: Secondary | ICD-10-CM | POA: Diagnosis not present

## 2011-10-16 DIAGNOSIS — M7989 Other specified soft tissue disorders: Secondary | ICD-10-CM | POA: Diagnosis not present

## 2011-10-16 DIAGNOSIS — I10 Essential (primary) hypertension: Secondary | ICD-10-CM | POA: Diagnosis not present

## 2011-10-16 DIAGNOSIS — L03119 Cellulitis of unspecified part of limb: Secondary | ICD-10-CM | POA: Diagnosis not present

## 2011-10-16 DIAGNOSIS — Z882 Allergy status to sulfonamides status: Secondary | ICD-10-CM | POA: Diagnosis not present

## 2011-10-16 DIAGNOSIS — L02419 Cutaneous abscess of limb, unspecified: Secondary | ICD-10-CM | POA: Diagnosis not present

## 2011-10-16 DIAGNOSIS — M25559 Pain in unspecified hip: Secondary | ICD-10-CM | POA: Diagnosis not present

## 2011-10-16 DIAGNOSIS — R6889 Other general symptoms and signs: Secondary | ICD-10-CM | POA: Diagnosis not present

## 2011-10-16 DIAGNOSIS — Z96649 Presence of unspecified artificial hip joint: Secondary | ICD-10-CM | POA: Diagnosis not present

## 2011-10-16 DIAGNOSIS — Z79899 Other long term (current) drug therapy: Secondary | ICD-10-CM | POA: Diagnosis not present

## 2011-10-16 DIAGNOSIS — S72009A Fracture of unspecified part of neck of unspecified femur, initial encounter for closed fracture: Secondary | ICD-10-CM | POA: Diagnosis not present

## 2011-10-16 LAB — COMPREHENSIVE METABOLIC PANEL
Albumin: 3.8 g/dL (ref 3.4–5.0)
Alkaline Phosphatase: 113 U/L (ref 50–136)
Anion Gap: 10 (ref 7–16)
BUN: 28 mg/dL — ABNORMAL HIGH (ref 7–18)
Chloride: 103 mmol/L (ref 98–107)
Osmolality: 287 (ref 275–301)
Potassium: 3.6 mmol/L (ref 3.5–5.1)
SGOT(AST): 31 U/L (ref 15–37)
SGPT (ALT): 17 U/L

## 2011-10-16 LAB — CBC WITH DIFFERENTIAL/PLATELET
Eosinophil #: 0.6 10*3/uL (ref 0.0–0.7)
Eosinophil %: 7.2 %
HGB: 10.3 g/dL — ABNORMAL LOW (ref 12.0–16.0)
Lymphocyte #: 1.4 10*3/uL (ref 1.0–3.6)
Lymphocyte %: 18 %
MCHC: 33 g/dL (ref 32.0–36.0)
Monocyte #: 0.5 x10 3/mm (ref 0.2–0.9)
Monocyte %: 7.1 %
Platelet: 233 10*3/uL (ref 150–440)
RDW: 16.1 % — ABNORMAL HIGH (ref 11.5–14.5)

## 2011-10-16 LAB — PROTIME-INR: INR: 0.8

## 2011-10-19 DIAGNOSIS — R58 Hemorrhage, not elsewhere classified: Secondary | ICD-10-CM | POA: Diagnosis not present

## 2011-10-19 DIAGNOSIS — Z79899 Other long term (current) drug therapy: Secondary | ICD-10-CM | POA: Diagnosis not present

## 2011-10-19 DIAGNOSIS — Z96649 Presence of unspecified artificial hip joint: Secondary | ICD-10-CM | POA: Diagnosis not present

## 2011-10-19 DIAGNOSIS — T8489XA Other specified complication of internal orthopedic prosthetic devices, implants and grafts, initial encounter: Secondary | ICD-10-CM | POA: Diagnosis not present

## 2011-10-19 DIAGNOSIS — I10 Essential (primary) hypertension: Secondary | ICD-10-CM | POA: Diagnosis not present

## 2011-10-20 DIAGNOSIS — Z96649 Presence of unspecified artificial hip joint: Secondary | ICD-10-CM | POA: Diagnosis not present

## 2011-10-20 DIAGNOSIS — R059 Cough, unspecified: Secondary | ICD-10-CM | POA: Diagnosis not present

## 2011-10-20 DIAGNOSIS — Z01818 Encounter for other preprocedural examination: Secondary | ICD-10-CM | POA: Diagnosis not present

## 2011-10-20 DIAGNOSIS — R58 Hemorrhage, not elsewhere classified: Secondary | ICD-10-CM | POA: Diagnosis not present

## 2011-10-20 DIAGNOSIS — T8489XA Other specified complication of internal orthopedic prosthetic devices, implants and grafts, initial encounter: Secondary | ICD-10-CM | POA: Diagnosis not present

## 2011-10-20 DIAGNOSIS — Z85038 Personal history of other malignant neoplasm of large intestine: Secondary | ICD-10-CM | POA: Diagnosis not present

## 2011-10-20 DIAGNOSIS — M009 Pyogenic arthritis, unspecified: Secondary | ICD-10-CM | POA: Diagnosis not present

## 2011-10-20 DIAGNOSIS — F410 Panic disorder [episodic paroxysmal anxiety] without agoraphobia: Secondary | ICD-10-CM | POA: Diagnosis not present

## 2011-10-20 DIAGNOSIS — R05 Cough: Secondary | ICD-10-CM | POA: Diagnosis not present

## 2011-10-20 DIAGNOSIS — Z79899 Other long term (current) drug therapy: Secondary | ICD-10-CM | POA: Diagnosis not present

## 2011-10-20 DIAGNOSIS — I10 Essential (primary) hypertension: Secondary | ICD-10-CM | POA: Diagnosis not present

## 2011-10-21 DIAGNOSIS — D649 Anemia, unspecified: Secondary | ICD-10-CM | POA: Diagnosis not present

## 2011-10-21 DIAGNOSIS — Z79899 Other long term (current) drug therapy: Secondary | ICD-10-CM | POA: Diagnosis not present

## 2011-10-21 DIAGNOSIS — I10 Essential (primary) hypertension: Secondary | ICD-10-CM | POA: Diagnosis not present

## 2011-10-21 DIAGNOSIS — F410 Panic disorder [episodic paroxysmal anxiety] without agoraphobia: Secondary | ICD-10-CM | POA: Diagnosis not present

## 2011-10-21 DIAGNOSIS — Z96649 Presence of unspecified artificial hip joint: Secondary | ICD-10-CM | POA: Diagnosis not present

## 2011-10-21 DIAGNOSIS — T8489XA Other specified complication of internal orthopedic prosthetic devices, implants and grafts, initial encounter: Secondary | ICD-10-CM | POA: Diagnosis not present

## 2011-10-21 DIAGNOSIS — R58 Hemorrhage, not elsewhere classified: Secondary | ICD-10-CM | POA: Diagnosis not present

## 2011-10-21 LAB — CULTURE, BLOOD (SINGLE)

## 2011-10-22 DIAGNOSIS — R58 Hemorrhage, not elsewhere classified: Secondary | ICD-10-CM | POA: Diagnosis not present

## 2011-10-22 DIAGNOSIS — I10 Essential (primary) hypertension: Secondary | ICD-10-CM | POA: Diagnosis not present

## 2011-10-22 DIAGNOSIS — F410 Panic disorder [episodic paroxysmal anxiety] without agoraphobia: Secondary | ICD-10-CM | POA: Diagnosis not present

## 2011-10-22 DIAGNOSIS — D649 Anemia, unspecified: Secondary | ICD-10-CM | POA: Diagnosis not present

## 2011-10-22 DIAGNOSIS — Z79899 Other long term (current) drug therapy: Secondary | ICD-10-CM | POA: Diagnosis not present

## 2011-10-22 DIAGNOSIS — T8489XA Other specified complication of internal orthopedic prosthetic devices, implants and grafts, initial encounter: Secondary | ICD-10-CM | POA: Diagnosis not present

## 2011-10-22 DIAGNOSIS — Z96649 Presence of unspecified artificial hip joint: Secondary | ICD-10-CM | POA: Diagnosis not present

## 2011-10-30 DIAGNOSIS — L981 Factitial dermatitis: Secondary | ICD-10-CM | POA: Insufficient documentation

## 2011-10-30 DIAGNOSIS — T888XXA Other specified complications of surgical and medical care, not elsewhere classified, initial encounter: Secondary | ICD-10-CM | POA: Diagnosis not present

## 2011-10-30 DIAGNOSIS — T8579XA Infection and inflammatory reaction due to other internal prosthetic devices, implants and grafts, initial encounter: Secondary | ICD-10-CM | POA: Insufficient documentation

## 2011-11-06 ENCOUNTER — Encounter: Payer: Self-pay | Admitting: Internal Medicine

## 2011-11-25 DIAGNOSIS — T84498A Other mechanical complication of other internal orthopedic devices, implants and grafts, initial encounter: Secondary | ICD-10-CM | POA: Diagnosis not present

## 2011-12-20 DIAGNOSIS — R5381 Other malaise: Secondary | ICD-10-CM | POA: Diagnosis not present

## 2011-12-20 DIAGNOSIS — R5383 Other fatigue: Secondary | ICD-10-CM | POA: Diagnosis not present

## 2012-01-21 DIAGNOSIS — I10 Essential (primary) hypertension: Secondary | ICD-10-CM | POA: Diagnosis not present

## 2012-01-21 DIAGNOSIS — G2589 Other specified extrapyramidal and movement disorders: Secondary | ICD-10-CM | POA: Diagnosis not present

## 2012-01-21 DIAGNOSIS — L0390 Cellulitis, unspecified: Secondary | ICD-10-CM | POA: Diagnosis not present

## 2012-01-21 DIAGNOSIS — L0291 Cutaneous abscess, unspecified: Secondary | ICD-10-CM | POA: Diagnosis not present

## 2012-02-02 DIAGNOSIS — M25559 Pain in unspecified hip: Secondary | ICD-10-CM | POA: Diagnosis not present

## 2012-02-02 DIAGNOSIS — E669 Obesity, unspecified: Secondary | ICD-10-CM | POA: Diagnosis not present

## 2012-02-02 DIAGNOSIS — I10 Essential (primary) hypertension: Secondary | ICD-10-CM | POA: Diagnosis not present

## 2012-02-02 DIAGNOSIS — Z9089 Acquired absence of other organs: Secondary | ICD-10-CM | POA: Diagnosis not present

## 2012-02-02 DIAGNOSIS — T84029A Dislocation of unspecified internal joint prosthesis, initial encounter: Secondary | ICD-10-CM | POA: Diagnosis not present

## 2012-02-02 DIAGNOSIS — X500XXA Overexertion from strenuous movement or load, initial encounter: Secondary | ICD-10-CM | POA: Diagnosis not present

## 2012-02-02 DIAGNOSIS — Z01818 Encounter for other preprocedural examination: Secondary | ICD-10-CM | POA: Diagnosis not present

## 2012-02-02 DIAGNOSIS — F17200 Nicotine dependence, unspecified, uncomplicated: Secondary | ICD-10-CM | POA: Diagnosis not present

## 2012-02-02 DIAGNOSIS — Z79899 Other long term (current) drug therapy: Secondary | ICD-10-CM | POA: Diagnosis not present

## 2012-02-02 DIAGNOSIS — Z882 Allergy status to sulfonamides status: Secondary | ICD-10-CM | POA: Diagnosis not present

## 2012-02-02 DIAGNOSIS — R52 Pain, unspecified: Secondary | ICD-10-CM | POA: Diagnosis not present

## 2012-02-02 DIAGNOSIS — Z96649 Presence of unspecified artificial hip joint: Secondary | ICD-10-CM | POA: Diagnosis not present

## 2012-02-02 DIAGNOSIS — M24459 Recurrent dislocation, unspecified hip: Secondary | ICD-10-CM | POA: Diagnosis not present

## 2012-02-02 DIAGNOSIS — IMO0002 Reserved for concepts with insufficient information to code with codable children: Secondary | ICD-10-CM | POA: Diagnosis not present

## 2012-02-02 DIAGNOSIS — Y9389 Activity, other specified: Secondary | ICD-10-CM | POA: Diagnosis not present

## 2012-02-02 DIAGNOSIS — Z9889 Other specified postprocedural states: Secondary | ICD-10-CM | POA: Diagnosis not present

## 2012-02-02 DIAGNOSIS — S73016A Posterior dislocation of unspecified hip, initial encounter: Secondary | ICD-10-CM | POA: Diagnosis not present

## 2012-02-11 DIAGNOSIS — F4323 Adjustment disorder with mixed anxiety and depressed mood: Secondary | ICD-10-CM | POA: Diagnosis not present

## 2012-02-18 DIAGNOSIS — F4323 Adjustment disorder with mixed anxiety and depressed mood: Secondary | ICD-10-CM | POA: Diagnosis not present

## 2012-02-25 DIAGNOSIS — Z23 Encounter for immunization: Secondary | ICD-10-CM | POA: Diagnosis not present

## 2012-02-25 DIAGNOSIS — I10 Essential (primary) hypertension: Secondary | ICD-10-CM | POA: Diagnosis not present

## 2012-02-25 DIAGNOSIS — F4323 Adjustment disorder with mixed anxiety and depressed mood: Secondary | ICD-10-CM | POA: Diagnosis not present

## 2012-03-02 DIAGNOSIS — T84498A Other mechanical complication of other internal orthopedic devices, implants and grafts, initial encounter: Secondary | ICD-10-CM | POA: Diagnosis not present

## 2012-03-15 ENCOUNTER — Emergency Department: Payer: Self-pay | Admitting: Emergency Medicine

## 2012-03-15 DIAGNOSIS — R109 Unspecified abdominal pain: Secondary | ICD-10-CM | POA: Diagnosis not present

## 2012-03-15 LAB — COMPREHENSIVE METABOLIC PANEL
Albumin: 3.5 g/dL (ref 3.4–5.0)
Anion Gap: 6 — ABNORMAL LOW (ref 7–16)
Calcium, Total: 9.6 mg/dL (ref 8.5–10.1)
Chloride: 105 mmol/L (ref 98–107)
Co2: 31 mmol/L (ref 21–32)
Glucose: 117 mg/dL — ABNORMAL HIGH (ref 65–99)
Osmolality: 289 (ref 275–301)
Potassium: 3.4 mmol/L — ABNORMAL LOW (ref 3.5–5.1)
SGOT(AST): 21 U/L (ref 15–37)
SGPT (ALT): 16 U/L (ref 12–78)
Sodium: 142 mmol/L (ref 136–145)
Total Protein: 7.2 g/dL (ref 6.4–8.2)

## 2012-03-15 LAB — CBC
HCT: 35.8 % (ref 35.0–47.0)
HGB: 12.2 g/dL (ref 12.0–16.0)
MCHC: 34.2 g/dL (ref 32.0–36.0)
Platelet: 218 10*3/uL (ref 150–440)
RBC: 4.19 10*6/uL (ref 3.80–5.20)
WBC: 8.7 10*3/uL (ref 3.6–11.0)

## 2012-03-16 LAB — URINALYSIS, COMPLETE
Bilirubin,UR: NEGATIVE
Glucose,UR: NEGATIVE mg/dL (ref 0–75)
Nitrite: NEGATIVE
Protein: NEGATIVE
RBC,UR: 1 /HPF (ref 0–5)
WBC UR: 5 /HPF (ref 0–5)

## 2012-03-31 DIAGNOSIS — Z23 Encounter for immunization: Secondary | ICD-10-CM | POA: Diagnosis not present

## 2012-03-31 DIAGNOSIS — I10 Essential (primary) hypertension: Secondary | ICD-10-CM | POA: Diagnosis not present

## 2012-03-31 DIAGNOSIS — R109 Unspecified abdominal pain: Secondary | ICD-10-CM | POA: Diagnosis not present

## 2012-04-27 DIAGNOSIS — T84498A Other mechanical complication of other internal orthopedic devices, implants and grafts, initial encounter: Secondary | ICD-10-CM | POA: Diagnosis not present

## 2012-04-28 DIAGNOSIS — R234 Changes in skin texture: Secondary | ICD-10-CM | POA: Diagnosis not present

## 2012-04-28 DIAGNOSIS — G2589 Other specified extrapyramidal and movement disorders: Secondary | ICD-10-CM | POA: Diagnosis not present

## 2012-04-28 DIAGNOSIS — I10 Essential (primary) hypertension: Secondary | ICD-10-CM | POA: Diagnosis not present

## 2012-06-09 DIAGNOSIS — L02419 Cutaneous abscess of limb, unspecified: Secondary | ICD-10-CM | POA: Diagnosis not present

## 2012-06-09 DIAGNOSIS — L0291 Cutaneous abscess, unspecified: Secondary | ICD-10-CM | POA: Diagnosis not present

## 2012-06-09 DIAGNOSIS — L03119 Cellulitis of unspecified part of limb: Secondary | ICD-10-CM | POA: Diagnosis not present

## 2012-06-10 DIAGNOSIS — T84498A Other mechanical complication of other internal orthopedic devices, implants and grafts, initial encounter: Secondary | ICD-10-CM | POA: Diagnosis not present

## 2012-07-22 DIAGNOSIS — F329 Major depressive disorder, single episode, unspecified: Secondary | ICD-10-CM | POA: Diagnosis not present

## 2012-07-22 DIAGNOSIS — I10 Essential (primary) hypertension: Secondary | ICD-10-CM | POA: Diagnosis not present

## 2012-07-22 DIAGNOSIS — Z0000 Encounter for general adult medical examination without abnormal findings: Secondary | ICD-10-CM | POA: Diagnosis not present

## 2012-07-22 DIAGNOSIS — F3289 Other specified depressive episodes: Secondary | ICD-10-CM | POA: Diagnosis not present

## 2012-07-29 DIAGNOSIS — Z1231 Encounter for screening mammogram for malignant neoplasm of breast: Secondary | ICD-10-CM | POA: Diagnosis not present

## 2012-10-02 DIAGNOSIS — M25559 Pain in unspecified hip: Secondary | ICD-10-CM | POA: Diagnosis not present

## 2012-10-02 DIAGNOSIS — M47817 Spondylosis without myelopathy or radiculopathy, lumbosacral region: Secondary | ICD-10-CM | POA: Diagnosis not present

## 2012-10-02 DIAGNOSIS — M169 Osteoarthritis of hip, unspecified: Secondary | ICD-10-CM | POA: Diagnosis not present

## 2012-10-02 DIAGNOSIS — M1610 Unilateral primary osteoarthritis, unspecified hip: Secondary | ICD-10-CM | POA: Diagnosis not present

## 2012-10-02 DIAGNOSIS — IMO0002 Reserved for concepts with insufficient information to code with codable children: Secondary | ICD-10-CM | POA: Diagnosis not present

## 2012-10-05 DIAGNOSIS — T84498A Other mechanical complication of other internal orthopedic devices, implants and grafts, initial encounter: Secondary | ICD-10-CM | POA: Diagnosis not present

## 2012-10-05 DIAGNOSIS — M25559 Pain in unspecified hip: Secondary | ICD-10-CM | POA: Diagnosis not present

## 2012-10-07 DIAGNOSIS — M25559 Pain in unspecified hip: Secondary | ICD-10-CM | POA: Diagnosis not present

## 2012-10-07 DIAGNOSIS — M169 Osteoarthritis of hip, unspecified: Secondary | ICD-10-CM | POA: Diagnosis not present

## 2012-10-07 DIAGNOSIS — M1610 Unilateral primary osteoarthritis, unspecified hip: Secondary | ICD-10-CM | POA: Diagnosis not present

## 2012-10-26 DIAGNOSIS — T8489XA Other specified complication of internal orthopedic prosthetic devices, implants and grafts, initial encounter: Secondary | ICD-10-CM | POA: Diagnosis not present

## 2012-10-30 DIAGNOSIS — M25559 Pain in unspecified hip: Secondary | ICD-10-CM | POA: Diagnosis not present

## 2012-10-30 DIAGNOSIS — M169 Osteoarthritis of hip, unspecified: Secondary | ICD-10-CM | POA: Diagnosis not present

## 2012-10-30 DIAGNOSIS — M1610 Unilateral primary osteoarthritis, unspecified hip: Secondary | ICD-10-CM | POA: Diagnosis not present

## 2012-10-30 DIAGNOSIS — Z01812 Encounter for preprocedural laboratory examination: Secondary | ICD-10-CM | POA: Diagnosis not present

## 2012-11-16 ENCOUNTER — Ambulatory Visit: Payer: Self-pay

## 2012-11-16 DIAGNOSIS — M47817 Spondylosis without myelopathy or radiculopathy, lumbosacral region: Secondary | ICD-10-CM | POA: Diagnosis not present

## 2012-11-16 DIAGNOSIS — M5126 Other intervertebral disc displacement, lumbar region: Secondary | ICD-10-CM | POA: Diagnosis not present

## 2012-11-16 DIAGNOSIS — Z9889 Other specified postprocedural states: Secondary | ICD-10-CM | POA: Diagnosis not present

## 2012-11-16 DIAGNOSIS — Z981 Arthrodesis status: Secondary | ICD-10-CM | POA: Diagnosis not present

## 2012-11-16 DIAGNOSIS — M79609 Pain in unspecified limb: Secondary | ICD-10-CM | POA: Diagnosis not present

## 2012-12-15 DIAGNOSIS — L981 Factitial dermatitis: Secondary | ICD-10-CM | POA: Diagnosis not present

## 2012-12-15 DIAGNOSIS — L280 Lichen simplex chronicus: Secondary | ICD-10-CM | POA: Diagnosis not present

## 2012-12-15 DIAGNOSIS — R21 Rash and other nonspecific skin eruption: Secondary | ICD-10-CM | POA: Diagnosis not present

## 2012-12-15 DIAGNOSIS — L821 Other seborrheic keratosis: Secondary | ICD-10-CM | POA: Diagnosis not present

## 2012-12-15 DIAGNOSIS — L820 Inflamed seborrheic keratosis: Secondary | ICD-10-CM | POA: Diagnosis not present

## 2013-01-25 DIAGNOSIS — G2589 Other specified extrapyramidal and movement disorders: Secondary | ICD-10-CM | POA: Diagnosis not present

## 2013-01-25 DIAGNOSIS — F329 Major depressive disorder, single episode, unspecified: Secondary | ICD-10-CM | POA: Diagnosis not present

## 2013-01-25 DIAGNOSIS — I10 Essential (primary) hypertension: Secondary | ICD-10-CM | POA: Diagnosis not present

## 2013-01-25 DIAGNOSIS — F3289 Other specified depressive episodes: Secondary | ICD-10-CM | POA: Diagnosis not present

## 2013-02-22 DIAGNOSIS — M25569 Pain in unspecified knee: Secondary | ICD-10-CM | POA: Diagnosis not present

## 2013-02-22 DIAGNOSIS — M1610 Unilateral primary osteoarthritis, unspecified hip: Secondary | ICD-10-CM | POA: Diagnosis not present

## 2013-02-22 DIAGNOSIS — M25559 Pain in unspecified hip: Secondary | ICD-10-CM | POA: Diagnosis not present

## 2013-02-22 DIAGNOSIS — M169 Osteoarthritis of hip, unspecified: Secondary | ICD-10-CM | POA: Diagnosis not present

## 2013-03-05 DIAGNOSIS — M25559 Pain in unspecified hip: Secondary | ICD-10-CM | POA: Diagnosis not present

## 2013-03-05 DIAGNOSIS — M169 Osteoarthritis of hip, unspecified: Secondary | ICD-10-CM | POA: Diagnosis not present

## 2013-03-05 DIAGNOSIS — M1610 Unilateral primary osteoarthritis, unspecified hip: Secondary | ICD-10-CM | POA: Diagnosis not present

## 2013-03-30 DIAGNOSIS — M899 Disorder of bone, unspecified: Secondary | ICD-10-CM | POA: Diagnosis not present

## 2013-03-30 DIAGNOSIS — G4730 Sleep apnea, unspecified: Secondary | ICD-10-CM | POA: Diagnosis not present

## 2013-03-30 DIAGNOSIS — G2589 Other specified extrapyramidal and movement disorders: Secondary | ICD-10-CM | POA: Diagnosis not present

## 2013-03-30 DIAGNOSIS — R7989 Other specified abnormal findings of blood chemistry: Secondary | ICD-10-CM | POA: Diagnosis not present

## 2013-03-30 DIAGNOSIS — L259 Unspecified contact dermatitis, unspecified cause: Secondary | ICD-10-CM | POA: Diagnosis not present

## 2013-03-30 DIAGNOSIS — F329 Major depressive disorder, single episode, unspecified: Secondary | ICD-10-CM | POA: Diagnosis not present

## 2013-03-30 DIAGNOSIS — F3289 Other specified depressive episodes: Secondary | ICD-10-CM | POA: Diagnosis not present

## 2013-03-30 DIAGNOSIS — G2581 Restless legs syndrome: Secondary | ICD-10-CM | POA: Diagnosis not present

## 2013-03-30 DIAGNOSIS — I10 Essential (primary) hypertension: Secondary | ICD-10-CM | POA: Diagnosis not present

## 2013-03-30 DIAGNOSIS — J019 Acute sinusitis, unspecified: Secondary | ICD-10-CM | POA: Diagnosis not present

## 2013-04-12 IMAGING — CR DG HIP COMPLETE 2+V*L*
1 series · 2 of 2 positions shown · non-contrast
Comparison: none

REASON FOR EXAM: L HIP PAIN; H/O MRSA
COMMENTS:   May transport without cardiac monitor

PROCEDURE:     DXR - DXR HIP LEFT COMPLETE  - July 25, 2011  [DATE]
RESULT:     Comparison:  None

[Series 1: t hip ap left · 0.14mm/px · 2 of 2 slices shown]
[im 1/2]
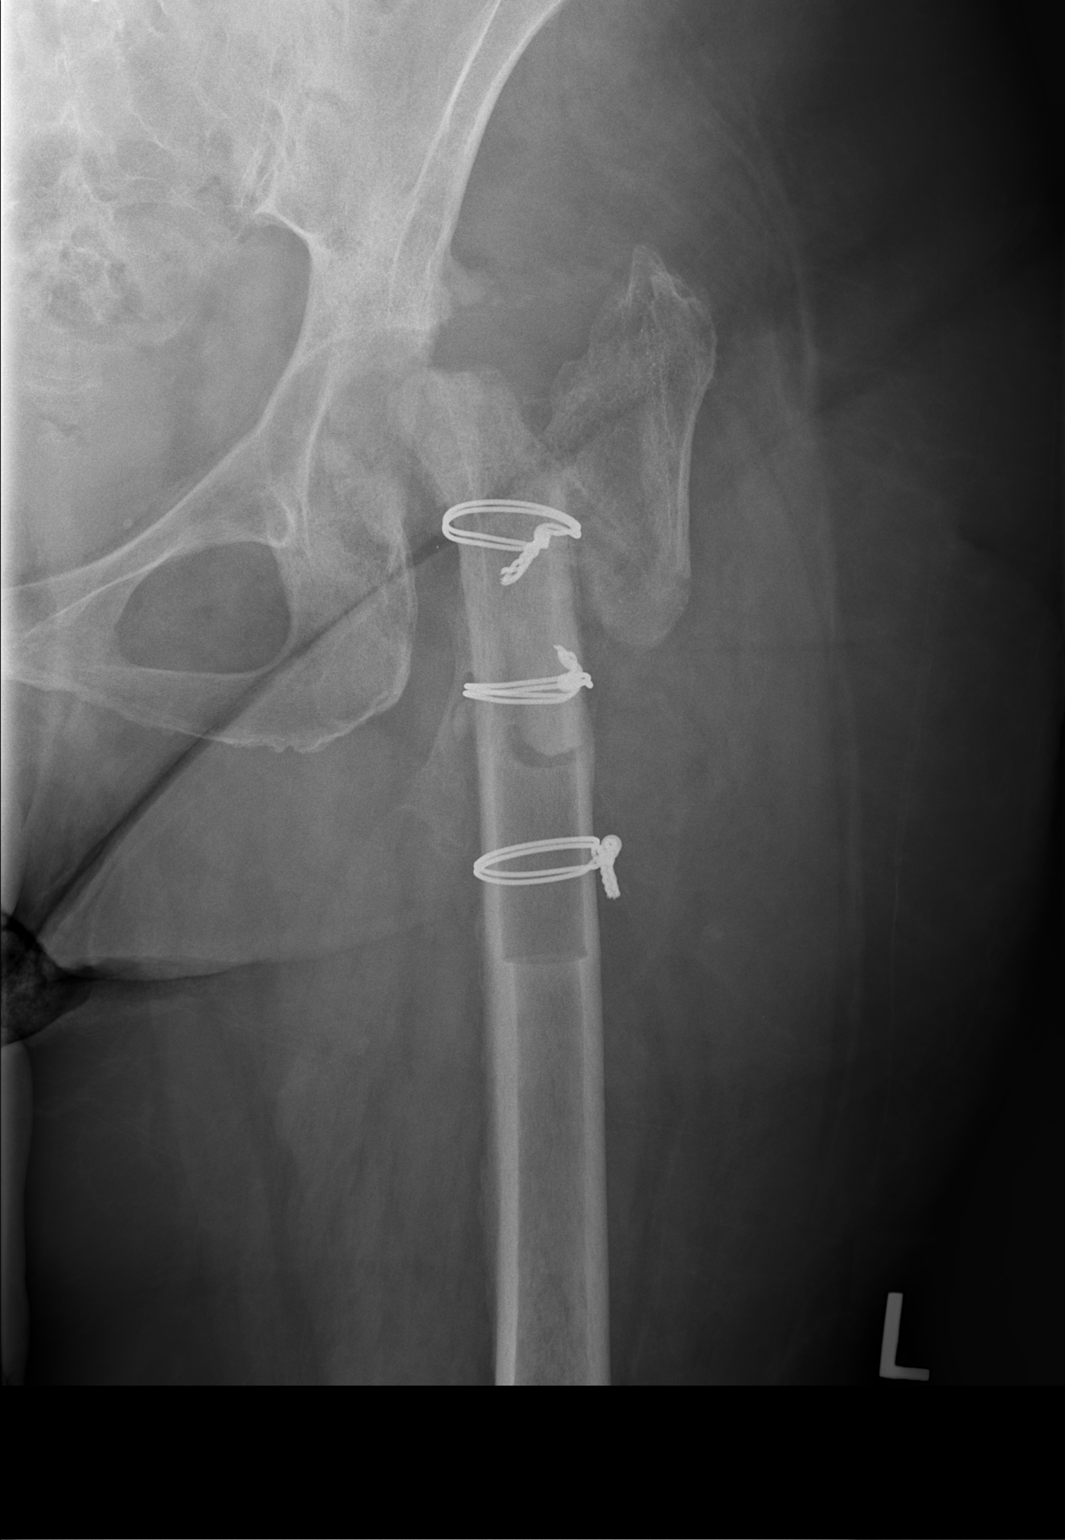
[im 2/2]
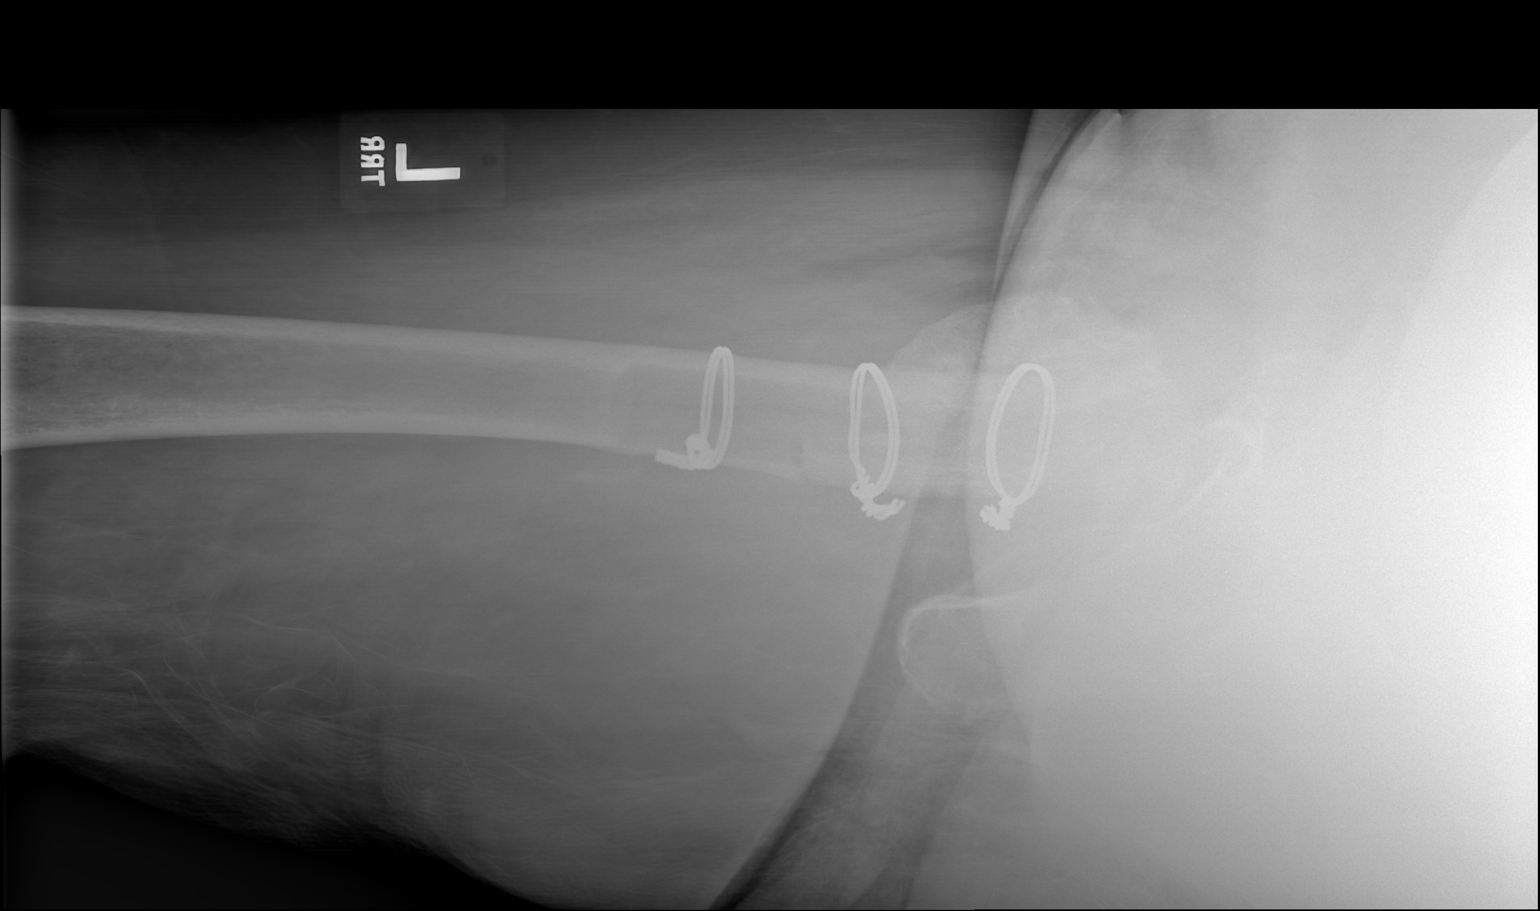

[2 of 2 positions shown; findings below may reference images not displayed]

FINDINGS: AP and lateral view of the left hip demonstrates interval removal of the
left arthroplasty. There is superior subluxation of the left femoral neck
relative to the acetabulum.
IMPRESSION: Please see above.

## 2013-04-12 IMAGING — CR DG CHEST 1V
1 series · 1 of 1 positions shown · non-contrast
Comparison: none

REASON FOR EXAM: CP
COMMENTS:   May transport without cardiac monitor

PROCEDURE:     DXR - DXR CHEST 1 VIEWAP OR PA  - July 25, 2011  [DATE]
RESULT:     Comparison: None

[t chest supine]
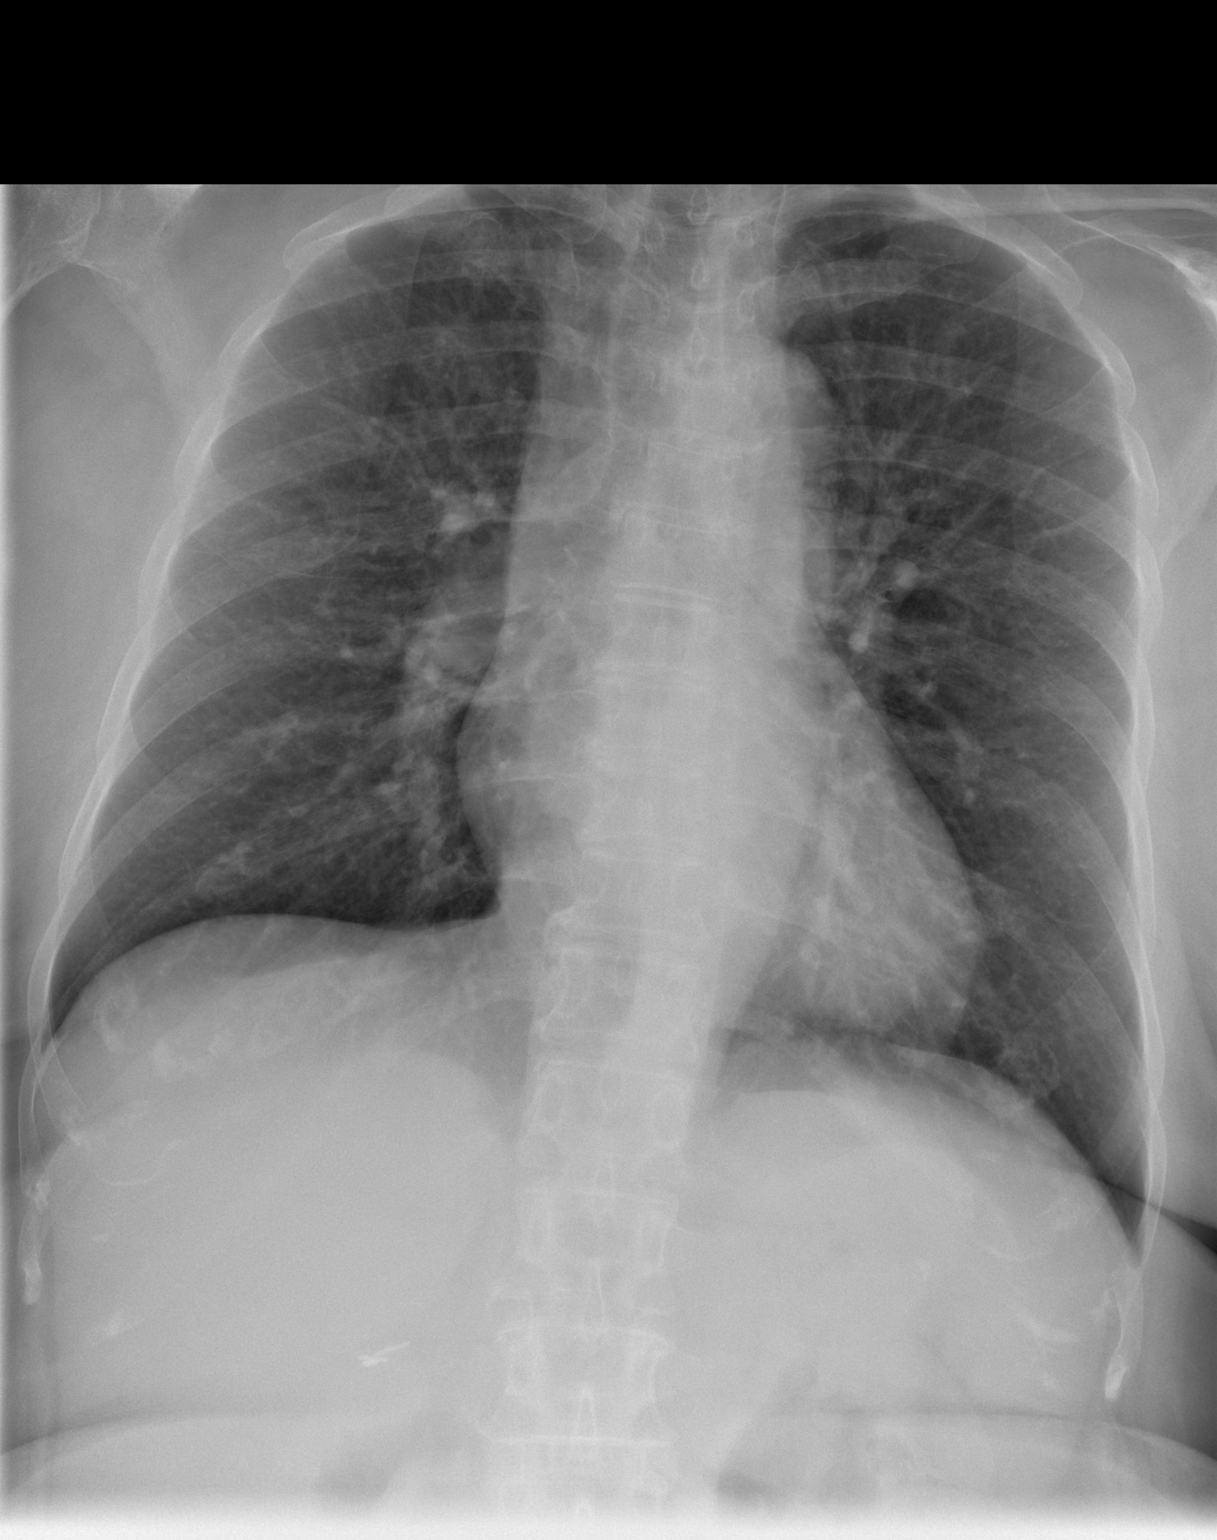

[1 of 1 positions shown; findings below may reference images not displayed]

FINDINGS: Single portable AP chest radiograph is provided.  There is no focal
parenchymal opacity, pleural effusion, or pneumothorax. Normal
cardiomediastinal silhouette. The osseous structures are unremarkable.
IMPRESSION: No acute disease of the chest.

## 2013-04-22 DIAGNOSIS — H04129 Dry eye syndrome of unspecified lacrimal gland: Secondary | ICD-10-CM | POA: Diagnosis not present

## 2013-04-22 DIAGNOSIS — H43399 Other vitreous opacities, unspecified eye: Secondary | ICD-10-CM | POA: Diagnosis not present

## 2013-04-22 DIAGNOSIS — H2510 Age-related nuclear cataract, unspecified eye: Secondary | ICD-10-CM | POA: Diagnosis not present

## 2013-04-26 DIAGNOSIS — N393 Stress incontinence (female) (male): Secondary | ICD-10-CM | POA: Diagnosis not present

## 2013-04-27 DIAGNOSIS — M1610 Unilateral primary osteoarthritis, unspecified hip: Secondary | ICD-10-CM | POA: Diagnosis not present

## 2013-04-27 DIAGNOSIS — M169 Osteoarthritis of hip, unspecified: Secondary | ICD-10-CM | POA: Diagnosis not present

## 2013-05-18 DIAGNOSIS — Z23 Encounter for immunization: Secondary | ICD-10-CM | POA: Diagnosis not present

## 2013-05-18 DIAGNOSIS — N390 Urinary tract infection, site not specified: Secondary | ICD-10-CM | POA: Diagnosis not present

## 2013-06-09 DIAGNOSIS — T84498A Other mechanical complication of other internal orthopedic devices, implants and grafts, initial encounter: Secondary | ICD-10-CM | POA: Diagnosis not present

## 2013-06-09 DIAGNOSIS — M1610 Unilateral primary osteoarthritis, unspecified hip: Secondary | ICD-10-CM | POA: Diagnosis not present

## 2013-06-09 DIAGNOSIS — M169 Osteoarthritis of hip, unspecified: Secondary | ICD-10-CM | POA: Diagnosis not present

## 2013-06-17 ENCOUNTER — Ambulatory Visit: Payer: Self-pay

## 2013-06-17 DIAGNOSIS — T8489XA Other specified complication of internal orthopedic prosthetic devices, implants and grafts, initial encounter: Secondary | ICD-10-CM | POA: Diagnosis not present

## 2013-06-17 DIAGNOSIS — M25559 Pain in unspecified hip: Secondary | ICD-10-CM | POA: Diagnosis not present

## 2013-06-17 DIAGNOSIS — M1610 Unilateral primary osteoarthritis, unspecified hip: Secondary | ICD-10-CM | POA: Diagnosis not present

## 2013-06-17 DIAGNOSIS — M169 Osteoarthritis of hip, unspecified: Secondary | ICD-10-CM | POA: Diagnosis not present

## 2013-06-29 DIAGNOSIS — M1610 Unilateral primary osteoarthritis, unspecified hip: Secondary | ICD-10-CM | POA: Diagnosis not present

## 2013-06-29 DIAGNOSIS — M169 Osteoarthritis of hip, unspecified: Secondary | ICD-10-CM | POA: Diagnosis not present

## 2013-07-04 ENCOUNTER — Emergency Department: Payer: Self-pay | Admitting: Emergency Medicine

## 2013-07-04 DIAGNOSIS — I10 Essential (primary) hypertension: Secondary | ICD-10-CM | POA: Diagnosis not present

## 2013-07-04 DIAGNOSIS — M25559 Pain in unspecified hip: Secondary | ICD-10-CM | POA: Diagnosis not present

## 2013-08-05 DIAGNOSIS — N390 Urinary tract infection, site not specified: Secondary | ICD-10-CM | POA: Diagnosis not present

## 2013-08-05 DIAGNOSIS — F410 Panic disorder [episodic paroxysmal anxiety] without agoraphobia: Secondary | ICD-10-CM | POA: Diagnosis not present

## 2013-08-05 DIAGNOSIS — C189 Malignant neoplasm of colon, unspecified: Secondary | ICD-10-CM | POA: Diagnosis not present

## 2013-08-05 DIAGNOSIS — R011 Cardiac murmur, unspecified: Secondary | ICD-10-CM | POA: Diagnosis not present

## 2013-08-09 DIAGNOSIS — R079 Chest pain, unspecified: Secondary | ICD-10-CM | POA: Diagnosis not present

## 2013-08-09 DIAGNOSIS — I2510 Atherosclerotic heart disease of native coronary artery without angina pectoris: Secondary | ICD-10-CM | POA: Diagnosis not present

## 2013-08-09 DIAGNOSIS — I10 Essential (primary) hypertension: Secondary | ICD-10-CM | POA: Diagnosis not present

## 2013-08-13 ENCOUNTER — Institutional Professional Consult (permissible substitution): Payer: No Typology Code available for payment source | Admitting: Neurology

## 2013-08-18 DIAGNOSIS — R011 Cardiac murmur, unspecified: Secondary | ICD-10-CM | POA: Diagnosis not present

## 2013-08-18 DIAGNOSIS — N390 Urinary tract infection, site not specified: Secondary | ICD-10-CM | POA: Diagnosis not present

## 2013-08-18 DIAGNOSIS — I10 Essential (primary) hypertension: Secondary | ICD-10-CM | POA: Diagnosis not present

## 2013-08-30 DIAGNOSIS — N390 Urinary tract infection, site not specified: Secondary | ICD-10-CM | POA: Diagnosis not present

## 2013-09-13 DIAGNOSIS — M1610 Unilateral primary osteoarthritis, unspecified hip: Secondary | ICD-10-CM | POA: Diagnosis not present

## 2013-09-14 DIAGNOSIS — L259 Unspecified contact dermatitis, unspecified cause: Secondary | ICD-10-CM | POA: Diagnosis not present

## 2013-09-27 DIAGNOSIS — R0602 Shortness of breath: Secondary | ICD-10-CM | POA: Diagnosis not present

## 2013-10-01 DIAGNOSIS — R079 Chest pain, unspecified: Secondary | ICD-10-CM | POA: Diagnosis not present

## 2013-10-01 DIAGNOSIS — Z01818 Encounter for other preprocedural examination: Secondary | ICD-10-CM | POA: Diagnosis not present

## 2013-10-01 DIAGNOSIS — M1710 Unilateral primary osteoarthritis, unspecified knee: Secondary | ICD-10-CM | POA: Diagnosis not present

## 2013-10-01 DIAGNOSIS — D649 Anemia, unspecified: Secondary | ICD-10-CM | POA: Diagnosis not present

## 2013-10-13 DIAGNOSIS — Z01818 Encounter for other preprocedural examination: Secondary | ICD-10-CM | POA: Diagnosis not present

## 2013-10-19 DIAGNOSIS — L259 Unspecified contact dermatitis, unspecified cause: Secondary | ICD-10-CM | POA: Diagnosis not present

## 2013-10-25 DIAGNOSIS — Z96649 Presence of unspecified artificial hip joint: Secondary | ICD-10-CM | POA: Insufficient documentation

## 2013-10-25 DIAGNOSIS — Z96642 Presence of left artificial hip joint: Secondary | ICD-10-CM | POA: Insufficient documentation

## 2013-10-26 DIAGNOSIS — M810 Age-related osteoporosis without current pathological fracture: Secondary | ICD-10-CM | POA: Diagnosis present

## 2013-10-26 DIAGNOSIS — Z981 Arthrodesis status: Secondary | ICD-10-CM | POA: Diagnosis not present

## 2013-10-26 DIAGNOSIS — F17200 Nicotine dependence, unspecified, uncomplicated: Secondary | ICD-10-CM | POA: Diagnosis present

## 2013-10-26 DIAGNOSIS — Z85038 Personal history of other malignant neoplasm of large intestine: Secondary | ICD-10-CM | POA: Diagnosis not present

## 2013-10-26 DIAGNOSIS — Z882 Allergy status to sulfonamides status: Secondary | ICD-10-CM | POA: Diagnosis not present

## 2013-10-26 DIAGNOSIS — Z885 Allergy status to narcotic agent status: Secondary | ICD-10-CM | POA: Diagnosis not present

## 2013-10-26 DIAGNOSIS — F411 Generalized anxiety disorder: Secondary | ICD-10-CM | POA: Diagnosis present

## 2013-10-26 DIAGNOSIS — M1610 Unilateral primary osteoarthritis, unspecified hip: Secondary | ICD-10-CM | POA: Diagnosis present

## 2013-10-26 DIAGNOSIS — G4733 Obstructive sleep apnea (adult) (pediatric): Secondary | ICD-10-CM | POA: Diagnosis present

## 2013-10-26 DIAGNOSIS — M169 Osteoarthritis of hip, unspecified: Secondary | ICD-10-CM | POA: Diagnosis present

## 2013-10-26 DIAGNOSIS — Z888 Allergy status to other drugs, medicaments and biological substances status: Secondary | ICD-10-CM | POA: Diagnosis not present

## 2013-10-26 DIAGNOSIS — Z79899 Other long term (current) drug therapy: Secondary | ICD-10-CM | POA: Diagnosis not present

## 2013-10-26 DIAGNOSIS — I10 Essential (primary) hypertension: Secondary | ICD-10-CM | POA: Diagnosis present

## 2013-10-26 DIAGNOSIS — G2581 Restless legs syndrome: Secondary | ICD-10-CM | POA: Diagnosis present

## 2013-10-26 DIAGNOSIS — Z538 Procedure and treatment not carried out for other reasons: Secondary | ICD-10-CM | POA: Diagnosis not present

## 2013-10-29 DIAGNOSIS — L299 Pruritus, unspecified: Secondary | ICD-10-CM | POA: Diagnosis not present

## 2013-10-29 DIAGNOSIS — L738 Other specified follicular disorders: Secondary | ICD-10-CM | POA: Diagnosis not present

## 2013-10-29 DIAGNOSIS — L678 Other hair color and hair shaft abnormalities: Secondary | ICD-10-CM | POA: Diagnosis not present

## 2013-11-17 DIAGNOSIS — R350 Frequency of micturition: Secondary | ICD-10-CM | POA: Diagnosis not present

## 2013-11-17 DIAGNOSIS — R351 Nocturia: Secondary | ICD-10-CM | POA: Diagnosis not present

## 2013-11-17 DIAGNOSIS — N3946 Mixed incontinence: Secondary | ICD-10-CM | POA: Diagnosis not present

## 2013-12-06 DIAGNOSIS — R350 Frequency of micturition: Secondary | ICD-10-CM | POA: Diagnosis not present

## 2013-12-06 DIAGNOSIS — N3946 Mixed incontinence: Secondary | ICD-10-CM | POA: Diagnosis not present

## 2014-01-11 DIAGNOSIS — L280 Lichen simplex chronicus: Secondary | ICD-10-CM | POA: Diagnosis not present

## 2014-01-11 DIAGNOSIS — L282 Other prurigo: Secondary | ICD-10-CM | POA: Diagnosis not present

## 2014-01-13 DIAGNOSIS — R350 Frequency of micturition: Secondary | ICD-10-CM | POA: Diagnosis not present

## 2014-01-13 DIAGNOSIS — N3946 Mixed incontinence: Secondary | ICD-10-CM | POA: Diagnosis not present

## 2014-01-19 DIAGNOSIS — M1610 Unilateral primary osteoarthritis, unspecified hip: Secondary | ICD-10-CM | POA: Diagnosis not present

## 2014-02-03 ENCOUNTER — Encounter: Payer: Self-pay | Admitting: Infectious Disease

## 2014-02-03 ENCOUNTER — Ambulatory Visit (INDEPENDENT_AMBULATORY_CARE_PROVIDER_SITE_OTHER): Payer: Medicare Other | Admitting: Infectious Disease

## 2014-02-03 VITALS — BP 135/78 | HR 87 | Temp 98.3°F | Ht 60.0 in | Wt 174.0 lb

## 2014-02-03 DIAGNOSIS — M25559 Pain in unspecified hip: Secondary | ICD-10-CM | POA: Diagnosis not present

## 2014-02-03 DIAGNOSIS — M25551 Pain in right hip: Secondary | ICD-10-CM

## 2014-02-03 DIAGNOSIS — F329 Major depressive disorder, single episode, unspecified: Secondary | ICD-10-CM | POA: Insufficient documentation

## 2014-02-03 DIAGNOSIS — T889XXS Complication of surgical and medical care, unspecified, sequela: Secondary | ICD-10-CM | POA: Diagnosis not present

## 2014-02-03 DIAGNOSIS — R82998 Other abnormal findings in urine: Secondary | ICD-10-CM

## 2014-02-03 DIAGNOSIS — R21 Rash and other nonspecific skin eruption: Secondary | ICD-10-CM | POA: Diagnosis not present

## 2014-02-03 DIAGNOSIS — T8459XA Infection and inflammatory reaction due to other internal joint prosthesis, initial encounter: Secondary | ICD-10-CM | POA: Insufficient documentation

## 2014-02-03 DIAGNOSIS — M25552 Pain in left hip: Secondary | ICD-10-CM

## 2014-02-03 DIAGNOSIS — M869 Osteomyelitis, unspecified: Secondary | ICD-10-CM | POA: Diagnosis not present

## 2014-02-03 DIAGNOSIS — M87051 Idiopathic aseptic necrosis of right femur: Secondary | ICD-10-CM | POA: Insufficient documentation

## 2014-02-03 DIAGNOSIS — C189 Malignant neoplasm of colon, unspecified: Secondary | ICD-10-CM | POA: Insufficient documentation

## 2014-02-03 DIAGNOSIS — M8700 Idiopathic aseptic necrosis of unspecified bone: Secondary | ICD-10-CM | POA: Diagnosis not present

## 2014-02-03 DIAGNOSIS — A4901 Methicillin susceptible Staphylococcus aureus infection, unspecified site: Secondary | ICD-10-CM | POA: Insufficient documentation

## 2014-02-03 DIAGNOSIS — R509 Fever, unspecified: Secondary | ICD-10-CM | POA: Diagnosis not present

## 2014-02-03 DIAGNOSIS — R8271 Bacteriuria: Secondary | ICD-10-CM

## 2014-02-03 DIAGNOSIS — T8459XS Infection and inflammatory reaction due to other internal joint prosthesis, sequela: Secondary | ICD-10-CM

## 2014-02-03 DIAGNOSIS — F32A Depression, unspecified: Secondary | ICD-10-CM | POA: Insufficient documentation

## 2014-02-03 DIAGNOSIS — Z96649 Presence of unspecified artificial hip joint: Principal | ICD-10-CM

## 2014-02-03 MED ORDER — CEPHALEXIN 500 MG PO CAPS: 500.0000 mg | ORAL_CAPSULE | Freq: Two times a day (BID) | ORAL | Status: DC

## 2014-02-03 NOTE — Patient Instructions (Signed)
WE NEED TO GET MRI OF BOTH OF YOUR HIPS  KEEP TAKING THE CEPHALEXIN

## 2014-02-03 NOTE — Progress Notes (Signed)
Subjective:    Patient ID: Ann Silva, female    DOB: 1945/08/02, 68 y.o.   MRN: [id number]  HPI  Ann Silva is a 68 year old Caucasian lady with history of colon cancer, total hip arthroplasty in 4481 in May complicated by infection with Staphylococcus aureus status post resection of total hip arthroplasty on 12/27/2009 with placement of antibiotic spacer status post IV antibiotics at [facility] in [place] at [facility] in [place] at a skilled nursing facility. She underwent removal of the antibiotic spacer on 04/04/2010. She's been treated for infection on 2 other occasions and now has had her final revision of her total hip arthroplasty performed 04/10/2011. Note during one of these episodes she was cared for by [name] at [facility].  He was apparently also diagnosed with avascular necrosis of the right hip and consideration was being given to a total right hip arthroplasty. However she developed a rash with circular erythematous lesions somewhat to what she had developed prior to the diagnosis of her septic total hip infection that she developed in 2011. Her arthritic surgeon [name] called ID in [place], [name] recommended therapy with cephalexin and she's been taking this without resolution of her rash she's been seen by dermatology here locally in [facility] and [name] group and rash continues to not resolve. The patient herself is concerned that she has deep infection in the hip joint on the right and in the left. She has high anxiety about recurrence of infection in her prosthetic hip and infection in the right native hip. She's been without fevers chills or nausea malaise but has significant hip pain the right more than the left.  She has been on ciprofloxacin prescribed by her urologist here locally. 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  Review of  Systems  Constitutional: Negative for fever, chills, diaphoresis, activity change, appetite change and fatigue.  HENT: Negative for congestion, sore throat and trouble swallowing.   Respiratory: Negative for stridor.   Cardiovascular: Negative for chest pain, palpitations and leg swelling.  Gastrointestinal: Negative for nausea, vomiting, abdominal pain, diarrhea, constipation, blood in stool, abdominal distention and anal bleeding.  Genitourinary: Negative for dysuria, urgency, frequency, hematuria, flank pain, difficulty urinating and pelvic pain.  Musculoskeletal: Positive for arthralgias and gait problem. Negative for back pain, joint swelling and myalgias.  Skin: Negative for color change, pallor, rash and wound.  Neurological: Negative for dizziness, tremors, weakness and light-headedness.  Hematological: Negative for adenopathy. Does not bruise/bleed easily.  Psychiatric/Behavioral: Negative for behavioral problems, confusion, sleep disturbance, dysphoric mood, decreased concentration and agitation.       Objective:   Physical Exam  Constitutional: She is oriented to person, place, and time. She appears well-developed and well-nourished. No distress.  HENT:  Head: Normocephalic and atraumatic.  Mouth/Throat: Oropharynx is clear and moist.  Eyes: Conjunctivae and EOM are normal. No scleral icterus.  Neck: Normal range of motion. Neck supple. No JVD present.  Cardiovascular: Normal rate, regular rhythm and normal heart sounds.   Pulmonary/Chest: Effort normal and breath sounds normal. No respiratory distress. She has no wheezes.  Abdominal: She exhibits no distension. There is no tenderness.  Musculoskeletal: She exhibits no edema and no tenderness.  Neurological: She is alert and oriented to person, place, and time. She exhibits normal muscle tone. Coordination normal.  Skin: Skin is warm and dry. Rash noted. She is not diaphoretic. No erythema. No pallor.  Psychiatric: She has  a  normal mood and affect. Her behavior is normal. Judgment and thought content normal.    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  --End interim she can continue on cephalexin although for the diagnosed infection based on MRI would like her to stop both cephalexin and ciprofloxacin for at least 2 weeks so that a surgical intervention can be planned off of antibiotics to get deep pus bone and other tissue for culture so that we can then target dose with IV antibiotics if in fact she has a deep infection. I spent greater than 60 minutes with the patient including greater than 50% of time in face to face counsel of the patient and in coordination of their care.    #2 rash: He does not appear typical to me for a bacterial dermatitis though she claims is similar rash developed prior to the onset of her original total hip arthroplasty infection. I have recommended that she continue the cephalexin for now but that she also try over-the-counter corticosteroids on some of the lesions to see if these help. Also would recommend her seeing a dermatologist again and consideration of a biopsy of one of these lesions.  #3 Recurrent bacteriuria: Not clear etiology has evidence of recurrent urinary tract infections but more likely she has simply recurrent and persistent colonization with bacteria in the bladder for now okay to have her on ciprofloxacin but I will take her off this if we're finding a deep infection in her hips , which reveals an area over the water off antibiotics I also am concerned about long-term risks of  fluoroquinolones in this patient including risk for C. difficile colitis, enteritis.

## 2014-02-11 ENCOUNTER — Ambulatory Visit
Admission: RE | Admit: 2014-02-11 | Discharge: 2014-02-11 | Disposition: A | Discharge status: Home / Self Care | Payer: Medicare Other | Source: Ambulatory Visit | Attending: Infectious Disease | Admitting: Infectious Disease

## 2014-02-11 DIAGNOSIS — M25552 Pain in left hip: Secondary | ICD-10-CM

## 2014-02-11 DIAGNOSIS — T8459XS Infection and inflammatory reaction due to other internal joint prosthesis, sequela: Secondary | ICD-10-CM

## 2014-02-11 DIAGNOSIS — M869 Osteomyelitis, unspecified: Secondary | ICD-10-CM

## 2014-02-11 DIAGNOSIS — Z96649 Presence of unspecified artificial hip joint: Principal | ICD-10-CM

## 2014-02-11 DIAGNOSIS — R509 Fever, unspecified: Secondary | ICD-10-CM

## 2014-02-11 DIAGNOSIS — M25551 Pain in right hip: Secondary | ICD-10-CM

## 2014-02-11 DIAGNOSIS — M25559 Pain in unspecified hip: Secondary | ICD-10-CM | POA: Diagnosis not present

## 2014-02-11 MED ORDER — GADOBENATE DIMEGLUMINE 529 MG/ML IV SOLN
16.0000 mL | Freq: Once | INTRAVENOUS | Status: AC | PRN
  Administered 2014-02-11: 16 mL via INTRAVENOUS

## 2014-02-18 ENCOUNTER — Telehealth: Payer: Self-pay | Admitting: *Deleted

## 2014-02-18 NOTE — Telephone Encounter (Signed)
Continuing severe pain in both hips ,10 out of 10, asking about MRI results.  Requesting call from MD.

## 2014-02-21 NOTE — Telephone Encounter (Signed)
Notified patient per Dr. Tommy Medal MRI results can not exclusively rule out infection and recommend joint aspiration. MRI results faxed to patient's surgeon [name] at [phone]. Dr. Julieanne Cotton pager # [phone] in case Dr. Tommy Medal needs to speak directly with him.

## 2014-02-24 ENCOUNTER — Telehealth: Payer: Self-pay | Admitting: *Deleted

## 2014-02-24 NOTE — Telephone Encounter (Signed)
Pt concerned about follow-up from recent MRI.  Orthopedic surgeon, Dr. Julieanne Cotton office, has been contact ed by the pt.  Per the pt, Dr. Julieanne Cotton office staff was not helpful in follow-up from Dr. Derek Mound last visit.  RN spoke with Dr. Tommy Medal who approved the MRI results to be sent to Dr. Julieanne Cotton office as part of his office follow-up orders.  Dr. Tommy Medal advised that his personal cell phone number should be included with the MRI results so that [name] may call him to discuss the case.    Pt informed that the MRI results were sent to [name] for review.

## 2014-03-01 ENCOUNTER — Ambulatory Visit: Payer: Self-pay | Admitting: Internal Medicine

## 2014-03-02 DIAGNOSIS — F17200 Nicotine dependence, unspecified, uncomplicated: Secondary | ICD-10-CM | POA: Diagnosis not present

## 2014-03-02 DIAGNOSIS — I10 Essential (primary) hypertension: Secondary | ICD-10-CM | POA: Diagnosis not present

## 2014-03-02 DIAGNOSIS — F410 Panic disorder [episodic paroxysmal anxiety] without agoraphobia: Secondary | ICD-10-CM | POA: Diagnosis not present

## 2014-03-03 ENCOUNTER — Telehealth: Payer: Self-pay | Admitting: Infectious Disease

## 2014-03-03 DIAGNOSIS — M009 Pyogenic arthritis, unspecified: Secondary | ICD-10-CM

## 2014-03-03 NOTE — Telephone Encounter (Signed)
Got in touch with her Surgeon Dr. Dionisio Paschal  He was OK with [place] going ahead and setting up aspirate of her hips for cell count and differential and culture from each hip   SHE WILL NEED TO BE OFF ANTIBIOTICS FOR 2-3 WEEKS PRIOR TO SUCH ASPIRATION  CAN WE CALL HER AND SEE IF SHE IS ON OR OFF ANTIBIOTICS AND THEN PLAN WITH IR FOR ASPIRATION AFTER SHE HAS BEEN OFF FOR 2-3 WEEKS?

## 2014-03-03 NOTE — Telephone Encounter (Signed)
Requesting that Dr. Baxter Flattery please call her.  Wondering if a conversation has occurred between Dr. Baxter Flattery and [name].

## 2014-03-03 NOTE — Telephone Encounter (Signed)
Patient states she has been off antibiotics since her last appointment (7/30). She wanted to know, "without being mean", if this was something that should be done by her surgeon instead of ID.  RN read the phone note to her, she stated she was ok with going ahead at [facility] IR.

## 2014-03-04 NOTE — Telephone Encounter (Signed)
Surgeon DOES NOT WANT TO go back in surgically unless he has to to treat infection, OR electively ONCE INFECTION IS CLEARED UP OR EXCLUDED

## 2014-03-07 ENCOUNTER — Other Ambulatory Visit: Payer: Self-pay | Admitting: Infectious Disease

## 2014-03-07 ENCOUNTER — Telehealth: Payer: Self-pay | Admitting: *Deleted

## 2014-03-07 ENCOUNTER — Ambulatory Visit (HOSPITAL_COMMUNITY)
Admission: RE | Admit: 2014-03-07 | Discharge: 2014-03-07 | Disposition: A | Discharge status: Home / Self Care | Payer: Medicare Other | Source: Ambulatory Visit | Attending: Infectious Disease | Admitting: Infectious Disease

## 2014-03-07 ENCOUNTER — Ambulatory Visit: Payer: Medicare Other | Admitting: Infectious Disease

## 2014-03-07 DIAGNOSIS — M25552 Pain in left hip: Secondary | ICD-10-CM

## 2014-03-07 DIAGNOSIS — M009 Pyogenic arthritis, unspecified: Secondary | ICD-10-CM | POA: Diagnosis not present

## 2014-03-07 DIAGNOSIS — M25459 Effusion, unspecified hip: Secondary | ICD-10-CM | POA: Diagnosis not present

## 2014-03-07 LAB — SYNOVIAL CELL COUNT + DIFF, W/ CRYSTALS
CRYSTALS FLUID: NONE SEEN
Crystals, Fluid: NONE SEEN
EOSINOPHILS-SYNOVIAL: 4 % — AB (ref 0–1)
Eosinophils-Synovial: 4 % — ABNORMAL HIGH (ref 0–1)
Lymphocytes-Synovial Fld: 20 % (ref 0–20)
Lymphocytes-Synovial Fld: 30 % — ABNORMAL HIGH (ref 0–20)
MONOCYTE-MACROPHAGE-SYNOVIAL FLUID: 9 % — AB (ref 50–90)
Monocyte-Macrophage-Synovial Fluid: 16 % — ABNORMAL LOW (ref 50–90)
NEUTROPHIL, SYNOVIAL: 67 % — AB (ref 0–25)
Neutrophil, Synovial: 50 % — ABNORMAL HIGH (ref 0–25)
Other Cells-SYN: 0
Other Cells-SYN: 0
WBC, Synovial: 31 /mm3 (ref 0–200)

## 2014-03-07 MED ORDER — IOHEXOL 180 MG/ML  SOLN
20.0000 mL | Freq: Once | INTRAMUSCULAR | Status: AC | PRN
  Administered 2014-03-07: 30 mL via INTRA_ARTICULAR

## 2014-03-07 NOTE — Telephone Encounter (Signed)
Romie Minus with Dr. Daymon Larsen office requested that copies of the biopsy results and if a sed rate and crp is obtained on patient there office would like results as well. Fax # [phone]. Myrtis Hopping

## 2014-03-10 ENCOUNTER — Telehealth: Payer: Self-pay | Admitting: Licensed Clinical Social Worker

## 2014-03-10 NOTE — Telephone Encounter (Signed)
Patient called today wanting results of body fluid culture from hip. Please advise

## 2014-03-10 NOTE — Telephone Encounter (Signed)
Her labs DO NOT look consistent with infection AT ALL. I am waiting for the finalized culture before sending these to her surgeon

## 2014-03-10 NOTE — Telephone Encounter (Signed)
I cant force her surgeon to do anything. I can declare her joints not infected. He thinks her rash is what led to her prior infection--I dont think so but I cant force him to operate. I will talke to him but I am not going to advise surgery or against it

## 2014-03-10 NOTE — Telephone Encounter (Signed)
I called her to let her know, she wanted to know if this is negative can you recommend to her surgeon that she can have the surgery.

## 2014-03-11 ENCOUNTER — Telehealth: Payer: Self-pay | Admitting: Licensed Clinical Social Worker

## 2014-03-11 LAB — BODY FLUID CULTURE
CULTURE: NO GROWTH
Culture: NO GROWTH
Gram Stain: NONE SEEN

## 2014-03-11 NOTE — Telephone Encounter (Signed)
I call patient to give her results of her fluid culture of her hip and to let her know that I was faxing it to her surgeon [name] at [phone]. She has a small rash on her hip that she wanted to have checked out in case her Surgeon wants to schedule her hip surgery soon. I scheduled an appointment with Dr. Linus Salmons on 03/15/14

## 2014-03-15 ENCOUNTER — Ambulatory Visit (INDEPENDENT_AMBULATORY_CARE_PROVIDER_SITE_OTHER): Payer: Medicare Other | Admitting: Internal Medicine

## 2014-03-15 ENCOUNTER — Encounter: Payer: Self-pay | Admitting: Internal Medicine

## 2014-03-15 VITALS — BP 150/80 | HR 80 | Temp 98.2°F | Wt 174.0 lb

## 2014-03-15 DIAGNOSIS — L981 Factitial dermatitis: Secondary | ICD-10-CM

## 2014-03-15 DIAGNOSIS — Z23 Encounter for immunization: Secondary | ICD-10-CM

## 2014-03-15 NOTE — Progress Notes (Signed)
   Subjective:    Patient ID: Ann Silva, female    DOB: 1946/02/12, 68 y.o.   MRN: [id number]  HPI 68 yo female with THA in 2011 followed by resection arthroplasty in 12/2009 from Staph aureus, 2 stage, and treated twice after that and then diagnosed with avascular necrosis of right hip and was being considered for replacement.  There was a rash on the hip and apparently thought to be infecitous and started on Keflex.  She was seen in consultation by Dr. Tommy Medal and labs and MRI and hip aspiration not consistent with infection.  She has been seen by dermatology and no apparent cause of rash known.  Blamed on infection but only a swab of skin done.   Waiting for hip replacement.    Review of Systems  Constitutional: Negative for fever and chills.  Gastrointestinal: Negative for nausea.  Musculoskeletal: Positive for myalgias.  Skin: Positive for rash.  Psychiatric/Behavioral: The patient is nervous/anxious.        Objective:   Physical Exam  Constitutional: She appears well-developed and well-nourished. No distress.  Eyes: No scleral icterus.  Skin: Rash noted.  unchanged          Assessment & Plan:

## 2014-03-15 NOTE — Assessment & Plan Note (Signed)
Unknown cause of rash.  Had skin swab with Staph but is of no value in determining etiology of rash.  It may be a sign of colonization so normal preop wash indicated.  Thorough evaluation does not indicate any active infection.  No indication from ID standpoint to postpone surgery or other intervention prior to surgery other than preop wash and perioperative antibiotics.   Please call with any questions. thanks

## 2014-03-22 ENCOUNTER — Telehealth: Payer: Self-pay | Admitting: Infectious Disease

## 2014-03-22 NOTE — Telephone Encounter (Addendum)
Lab reports routed to [name] 03/22/14.

## 2014-03-22 NOTE — Telephone Encounter (Signed)
Thanks Denise! 

## 2014-03-22 NOTE — Telephone Encounter (Signed)
Ann Silva can you fax Ann Silva's results to her surgeon  Ann Silva at   [phone]   Her labs cell count and differential and cultures ARE NOT consistent with infection at all.  It looks like Dr> Linus Salmons has since seen the patient as well and agrees

## 2014-04-06 DIAGNOSIS — L299 Pruritus, unspecified: Secondary | ICD-10-CM | POA: Diagnosis not present

## 2014-05-04 DIAGNOSIS — R739 Hyperglycemia, unspecified: Secondary | ICD-10-CM | POA: Diagnosis not present

## 2014-05-04 DIAGNOSIS — I10 Essential (primary) hypertension: Secondary | ICD-10-CM | POA: Diagnosis not present

## 2014-06-06 DIAGNOSIS — Z01818 Encounter for other preprocedural examination: Secondary | ICD-10-CM | POA: Diagnosis not present

## 2014-06-06 DIAGNOSIS — Z01812 Encounter for preprocedural laboratory examination: Secondary | ICD-10-CM | POA: Diagnosis not present

## 2014-06-06 DIAGNOSIS — M1610 Unilateral primary osteoarthritis, unspecified hip: Secondary | ICD-10-CM | POA: Diagnosis not present

## 2014-06-06 DIAGNOSIS — M1611 Unilateral primary osteoarthritis, right hip: Secondary | ICD-10-CM | POA: Diagnosis not present

## 2014-06-06 DIAGNOSIS — I10 Essential (primary) hypertension: Secondary | ICD-10-CM | POA: Diagnosis not present

## 2014-06-16 DIAGNOSIS — Z882 Allergy status to sulfonamides status: Secondary | ICD-10-CM | POA: Diagnosis not present

## 2014-06-16 DIAGNOSIS — T50901A Poisoning by unspecified drugs, medicaments and biological substances, accidental (unintentional), initial encounter: Secondary | ICD-10-CM | POA: Diagnosis not present

## 2014-06-16 DIAGNOSIS — G4733 Obstructive sleep apnea (adult) (pediatric): Secondary | ICD-10-CM | POA: Diagnosis not present

## 2014-06-16 DIAGNOSIS — Z471 Aftercare following joint replacement surgery: Secondary | ICD-10-CM | POA: Diagnosis not present

## 2014-06-16 DIAGNOSIS — Z7901 Long term (current) use of anticoagulants: Secondary | ICD-10-CM | POA: Diagnosis not present

## 2014-06-16 DIAGNOSIS — D62 Acute posthemorrhagic anemia: Secondary | ICD-10-CM | POA: Diagnosis not present

## 2014-06-16 DIAGNOSIS — Z888 Allergy status to other drugs, medicaments and biological substances status: Secondary | ICD-10-CM | POA: Diagnosis not present

## 2014-06-16 DIAGNOSIS — I9581 Postprocedural hypotension: Secondary | ICD-10-CM | POA: Diagnosis not present

## 2014-06-16 DIAGNOSIS — G2581 Restless legs syndrome: Secondary | ICD-10-CM | POA: Diagnosis not present

## 2014-06-16 DIAGNOSIS — F419 Anxiety disorder, unspecified: Secondary | ICD-10-CM | POA: Diagnosis not present

## 2014-06-16 DIAGNOSIS — M25559 Pain in unspecified hip: Secondary | ICD-10-CM | POA: Diagnosis not present

## 2014-06-16 DIAGNOSIS — Z96649 Presence of unspecified artificial hip joint: Secondary | ICD-10-CM | POA: Diagnosis not present

## 2014-06-16 DIAGNOSIS — Z96641 Presence of right artificial hip joint: Secondary | ICD-10-CM | POA: Diagnosis not present

## 2014-06-16 DIAGNOSIS — I10 Essential (primary) hypertension: Secondary | ICD-10-CM | POA: Diagnosis not present

## 2014-06-16 DIAGNOSIS — M1611 Unilateral primary osteoarthritis, right hip: Secondary | ICD-10-CM | POA: Diagnosis not present

## 2014-06-16 DIAGNOSIS — Z79899 Other long term (current) drug therapy: Secondary | ICD-10-CM | POA: Diagnosis not present

## 2014-06-16 DIAGNOSIS — Z885 Allergy status to narcotic agent status: Secondary | ICD-10-CM | POA: Diagnosis not present

## 2014-06-16 DIAGNOSIS — F329 Major depressive disorder, single episode, unspecified: Secondary | ICD-10-CM | POA: Diagnosis present

## 2014-06-16 DIAGNOSIS — I959 Hypotension, unspecified: Secondary | ICD-10-CM | POA: Diagnosis not present

## 2014-06-16 DIAGNOSIS — M25551 Pain in right hip: Secondary | ICD-10-CM | POA: Diagnosis not present

## 2014-06-17 DIAGNOSIS — D649 Anemia, unspecified: Secondary | ICD-10-CM | POA: Insufficient documentation

## 2014-06-17 DIAGNOSIS — T50905A Adverse effect of unspecified drugs, medicaments and biological substances, initial encounter: Secondary | ICD-10-CM

## 2014-06-17 DIAGNOSIS — R404 Transient alteration of awareness: Secondary | ICD-10-CM | POA: Insufficient documentation

## 2014-06-20 DIAGNOSIS — M169 Osteoarthritis of hip, unspecified: Secondary | ICD-10-CM | POA: Diagnosis not present

## 2014-06-21 ENCOUNTER — Emergency Department: Payer: Self-pay | Admitting: Emergency Medicine

## 2014-06-21 DIAGNOSIS — R609 Edema, unspecified: Secondary | ICD-10-CM | POA: Diagnosis not present

## 2014-06-21 DIAGNOSIS — M7989 Other specified soft tissue disorders: Secondary | ICD-10-CM | POA: Diagnosis not present

## 2014-06-21 DIAGNOSIS — M21372 Foot drop, left foot: Secondary | ICD-10-CM | POA: Diagnosis not present

## 2014-06-21 DIAGNOSIS — Z7901 Long term (current) use of anticoagulants: Secondary | ICD-10-CM | POA: Diagnosis not present

## 2014-06-21 DIAGNOSIS — Z79899 Other long term (current) drug therapy: Secondary | ICD-10-CM | POA: Diagnosis not present

## 2014-06-21 DIAGNOSIS — Z720 Tobacco use: Secondary | ICD-10-CM | POA: Diagnosis not present

## 2014-06-21 DIAGNOSIS — G2581 Restless legs syndrome: Secondary | ICD-10-CM | POA: Diagnosis not present

## 2014-06-21 DIAGNOSIS — I10 Essential (primary) hypertension: Secondary | ICD-10-CM | POA: Diagnosis not present

## 2014-06-21 DIAGNOSIS — F419 Anxiety disorder, unspecified: Secondary | ICD-10-CM | POA: Diagnosis not present

## 2014-06-21 DIAGNOSIS — D649 Anemia, unspecified: Secondary | ICD-10-CM | POA: Diagnosis not present

## 2014-06-21 DIAGNOSIS — M1990 Unspecified osteoarthritis, unspecified site: Secondary | ICD-10-CM | POA: Diagnosis not present

## 2014-06-21 DIAGNOSIS — Z7951 Long term (current) use of inhaled steroids: Secondary | ICD-10-CM | POA: Diagnosis not present

## 2014-06-21 DIAGNOSIS — Z471 Aftercare following joint replacement surgery: Secondary | ICD-10-CM | POA: Diagnosis not present

## 2014-06-21 LAB — CBC WITH DIFFERENTIAL/PLATELET
Basophil #: 0 10*3/uL (ref 0.0–0.1)
Basophil %: 0.5 %
EOS PCT: 7.2 %
Eosinophil #: 0.4 10*3/uL (ref 0.0–0.7)
HCT: 25.2 % — AB (ref 35.0–47.0)
HGB: 8.2 g/dL — ABNORMAL LOW (ref 12.0–16.0)
Lymphocyte #: 1.4 10*3/uL (ref 1.0–3.6)
Lymphocyte %: 22 %
MCH: 29.9 pg (ref 26.0–34.0)
MCHC: 32.7 g/dL (ref 32.0–36.0)
MCV: 92 fL (ref 80–100)
Monocyte #: 0.5 x10 3/mm (ref 0.2–0.9)
Monocyte %: 8.1 %
NEUTROS ABS: 3.9 10*3/uL (ref 1.4–6.5)
Neutrophil %: 62.2 %
PLATELETS: 288 10*3/uL (ref 150–440)
RBC: 2.75 10*6/uL — ABNORMAL LOW (ref 3.80–5.20)
RDW: 14.4 % (ref 11.5–14.5)
WBC: 6.2 10*3/uL (ref 3.6–11.0)

## 2014-06-21 LAB — PROTIME-INR
INR: 2.2
Prothrombin Time: 23.5 secs — ABNORMAL HIGH (ref 11.5–14.7)

## 2014-06-21 LAB — BASIC METABOLIC PANEL
Anion Gap: 7 (ref 7–16)
BUN: 15 mg/dL (ref 7–18)
Calcium, Total: 9.5 mg/dL (ref 8.5–10.1)
Chloride: 104 mmol/L (ref 98–107)
Co2: 28 mmol/L (ref 21–32)
Creatinine: 0.91 mg/dL (ref 0.60–1.30)
EGFR (African American): >60
EGFR (Non-African Amer.): >60
Glucose: 106 mg/dL — ABNORMAL HIGH (ref 65–99)
Osmolality: 279 (ref 275–301)
Potassium: 3.1 mmol/L — ABNORMAL LOW (ref 3.5–5.1)
SODIUM: 139 mmol/L (ref 136–145)

## 2014-06-21 LAB — APTT: ACTIVATED PTT: 58.2 s — AB (ref 23.6–35.9)

## 2014-06-23 DIAGNOSIS — G2581 Restless legs syndrome: Secondary | ICD-10-CM | POA: Diagnosis not present

## 2014-06-23 DIAGNOSIS — I10 Essential (primary) hypertension: Secondary | ICD-10-CM | POA: Diagnosis not present

## 2014-06-23 DIAGNOSIS — Z471 Aftercare following joint replacement surgery: Secondary | ICD-10-CM | POA: Diagnosis not present

## 2014-06-23 DIAGNOSIS — F419 Anxiety disorder, unspecified: Secondary | ICD-10-CM | POA: Diagnosis not present

## 2014-06-23 DIAGNOSIS — M1990 Unspecified osteoarthritis, unspecified site: Secondary | ICD-10-CM | POA: Diagnosis not present

## 2014-06-23 DIAGNOSIS — M21372 Foot drop, left foot: Secondary | ICD-10-CM | POA: Diagnosis not present

## 2014-06-24 DIAGNOSIS — M1990 Unspecified osteoarthritis, unspecified site: Secondary | ICD-10-CM | POA: Diagnosis not present

## 2014-06-24 DIAGNOSIS — F419 Anxiety disorder, unspecified: Secondary | ICD-10-CM | POA: Diagnosis not present

## 2014-06-24 DIAGNOSIS — Z471 Aftercare following joint replacement surgery: Secondary | ICD-10-CM | POA: Diagnosis not present

## 2014-06-24 DIAGNOSIS — I10 Essential (primary) hypertension: Secondary | ICD-10-CM | POA: Diagnosis not present

## 2014-06-24 DIAGNOSIS — M21372 Foot drop, left foot: Secondary | ICD-10-CM | POA: Diagnosis not present

## 2014-06-24 DIAGNOSIS — G2581 Restless legs syndrome: Secondary | ICD-10-CM | POA: Diagnosis not present

## 2014-06-27 DIAGNOSIS — M21372 Foot drop, left foot: Secondary | ICD-10-CM | POA: Diagnosis not present

## 2014-06-27 DIAGNOSIS — G2581 Restless legs syndrome: Secondary | ICD-10-CM | POA: Diagnosis not present

## 2014-06-27 DIAGNOSIS — Z471 Aftercare following joint replacement surgery: Secondary | ICD-10-CM | POA: Diagnosis not present

## 2014-06-27 DIAGNOSIS — M1990 Unspecified osteoarthritis, unspecified site: Secondary | ICD-10-CM | POA: Diagnosis not present

## 2014-06-27 DIAGNOSIS — I10 Essential (primary) hypertension: Secondary | ICD-10-CM | POA: Diagnosis not present

## 2014-06-27 DIAGNOSIS — F419 Anxiety disorder, unspecified: Secondary | ICD-10-CM | POA: Diagnosis not present

## 2014-06-28 DIAGNOSIS — I10 Essential (primary) hypertension: Secondary | ICD-10-CM | POA: Diagnosis not present

## 2014-06-28 DIAGNOSIS — Z471 Aftercare following joint replacement surgery: Secondary | ICD-10-CM | POA: Diagnosis not present

## 2014-06-28 DIAGNOSIS — G2581 Restless legs syndrome: Secondary | ICD-10-CM | POA: Diagnosis not present

## 2014-06-28 DIAGNOSIS — M1990 Unspecified osteoarthritis, unspecified site: Secondary | ICD-10-CM | POA: Diagnosis not present

## 2014-06-28 DIAGNOSIS — F419 Anxiety disorder, unspecified: Secondary | ICD-10-CM | POA: Diagnosis not present

## 2014-06-28 DIAGNOSIS — M21372 Foot drop, left foot: Secondary | ICD-10-CM | POA: Diagnosis not present

## 2014-06-29 DIAGNOSIS — N390 Urinary tract infection, site not specified: Secondary | ICD-10-CM | POA: Diagnosis not present

## 2014-06-30 DIAGNOSIS — Z471 Aftercare following joint replacement surgery: Secondary | ICD-10-CM | POA: Diagnosis not present

## 2014-06-30 DIAGNOSIS — F419 Anxiety disorder, unspecified: Secondary | ICD-10-CM | POA: Diagnosis not present

## 2014-06-30 DIAGNOSIS — G2581 Restless legs syndrome: Secondary | ICD-10-CM | POA: Diagnosis not present

## 2014-06-30 DIAGNOSIS — M1990 Unspecified osteoarthritis, unspecified site: Secondary | ICD-10-CM | POA: Diagnosis not present

## 2014-06-30 DIAGNOSIS — M21372 Foot drop, left foot: Secondary | ICD-10-CM | POA: Diagnosis not present

## 2014-06-30 DIAGNOSIS — I10 Essential (primary) hypertension: Secondary | ICD-10-CM | POA: Diagnosis not present

## 2014-07-04 DIAGNOSIS — F419 Anxiety disorder, unspecified: Secondary | ICD-10-CM | POA: Diagnosis not present

## 2014-07-04 DIAGNOSIS — M21372 Foot drop, left foot: Secondary | ICD-10-CM | POA: Diagnosis not present

## 2014-07-04 DIAGNOSIS — G2581 Restless legs syndrome: Secondary | ICD-10-CM | POA: Diagnosis not present

## 2014-07-04 DIAGNOSIS — M1990 Unspecified osteoarthritis, unspecified site: Secondary | ICD-10-CM | POA: Diagnosis not present

## 2014-07-04 DIAGNOSIS — I10 Essential (primary) hypertension: Secondary | ICD-10-CM | POA: Diagnosis not present

## 2014-07-04 DIAGNOSIS — Z471 Aftercare following joint replacement surgery: Secondary | ICD-10-CM | POA: Diagnosis not present

## 2014-07-06 DIAGNOSIS — M21372 Foot drop, left foot: Secondary | ICD-10-CM | POA: Diagnosis not present

## 2014-07-06 DIAGNOSIS — I10 Essential (primary) hypertension: Secondary | ICD-10-CM | POA: Diagnosis not present

## 2014-07-06 DIAGNOSIS — Z471 Aftercare following joint replacement surgery: Secondary | ICD-10-CM | POA: Diagnosis not present

## 2014-07-06 DIAGNOSIS — F419 Anxiety disorder, unspecified: Secondary | ICD-10-CM | POA: Diagnosis not present

## 2014-07-06 DIAGNOSIS — G2581 Restless legs syndrome: Secondary | ICD-10-CM | POA: Diagnosis not present

## 2014-07-06 DIAGNOSIS — M1990 Unspecified osteoarthritis, unspecified site: Secondary | ICD-10-CM | POA: Diagnosis not present

## 2014-07-07 DIAGNOSIS — M21372 Foot drop, left foot: Secondary | ICD-10-CM | POA: Diagnosis not present

## 2014-07-07 DIAGNOSIS — G2581 Restless legs syndrome: Secondary | ICD-10-CM | POA: Diagnosis not present

## 2014-07-07 DIAGNOSIS — M1990 Unspecified osteoarthritis, unspecified site: Secondary | ICD-10-CM | POA: Diagnosis not present

## 2014-07-07 DIAGNOSIS — Z471 Aftercare following joint replacement surgery: Secondary | ICD-10-CM | POA: Diagnosis not present

## 2014-07-07 DIAGNOSIS — I10 Essential (primary) hypertension: Secondary | ICD-10-CM | POA: Diagnosis not present

## 2014-07-07 DIAGNOSIS — F419 Anxiety disorder, unspecified: Secondary | ICD-10-CM | POA: Diagnosis not present

## 2014-07-09 ENCOUNTER — Emergency Department: Payer: Self-pay | Admitting: Internal Medicine

## 2014-07-09 DIAGNOSIS — M79601 Pain in right arm: Secondary | ICD-10-CM | POA: Diagnosis not present

## 2014-07-09 DIAGNOSIS — R079 Chest pain, unspecified: Secondary | ICD-10-CM | POA: Diagnosis not present

## 2014-07-09 DIAGNOSIS — M549 Dorsalgia, unspecified: Secondary | ICD-10-CM | POA: Diagnosis not present

## 2014-07-09 DIAGNOSIS — R52 Pain, unspecified: Secondary | ICD-10-CM | POA: Diagnosis not present

## 2014-07-09 DIAGNOSIS — M25519 Pain in unspecified shoulder: Secondary | ICD-10-CM | POA: Diagnosis not present

## 2014-07-09 DIAGNOSIS — M25511 Pain in right shoulder: Secondary | ICD-10-CM | POA: Diagnosis not present

## 2014-07-09 LAB — BASIC METABOLIC PANEL
Anion Gap: 8 (ref 7–16)
BUN: 23 mg/dL — ABNORMAL HIGH (ref 7–18)
CHLORIDE: 104 mmol/L (ref 98–107)
CREATININE: 0.75 mg/dL (ref 0.60–1.30)
Calcium, Total: 9.8 mg/dL (ref 8.5–10.1)
Co2: 27 mmol/L (ref 21–32)
Glucose: 97 mg/dL (ref 65–99)
OSMOLALITY: 281 (ref 275–301)
Potassium: 3.4 mmol/L — ABNORMAL LOW (ref 3.5–5.1)
Sodium: 139 mmol/L (ref 136–145)

## 2014-07-09 LAB — CBC
HCT: 31 % — ABNORMAL LOW (ref 35.0–47.0)
HGB: 10 g/dL — AB (ref 12.0–16.0)
MCH: 28.9 pg (ref 26.0–34.0)
MCHC: 32.4 g/dL (ref 32.0–36.0)
MCV: 89 fL (ref 80–100)
PLATELETS: 398 10*3/uL (ref 150–440)
RBC: 3.46 10*6/uL — AB (ref 3.80–5.20)
RDW: 15.1 % — AB (ref 11.5–14.5)
WBC: 7.9 10*3/uL (ref 3.6–11.0)

## 2014-07-09 LAB — TROPONIN I: Troponin-I: <0.02 ng/mL

## 2014-07-11 DIAGNOSIS — Z471 Aftercare following joint replacement surgery: Secondary | ICD-10-CM | POA: Diagnosis not present

## 2014-07-11 DIAGNOSIS — G2581 Restless legs syndrome: Secondary | ICD-10-CM | POA: Diagnosis not present

## 2014-07-11 DIAGNOSIS — M21372 Foot drop, left foot: Secondary | ICD-10-CM | POA: Diagnosis not present

## 2014-07-11 DIAGNOSIS — F419 Anxiety disorder, unspecified: Secondary | ICD-10-CM | POA: Diagnosis not present

## 2014-07-11 DIAGNOSIS — I10 Essential (primary) hypertension: Secondary | ICD-10-CM | POA: Diagnosis not present

## 2014-07-11 DIAGNOSIS — M1990 Unspecified osteoarthritis, unspecified site: Secondary | ICD-10-CM | POA: Diagnosis not present

## 2014-07-12 DIAGNOSIS — M1990 Unspecified osteoarthritis, unspecified site: Secondary | ICD-10-CM | POA: Diagnosis not present

## 2014-07-12 DIAGNOSIS — G2581 Restless legs syndrome: Secondary | ICD-10-CM | POA: Diagnosis not present

## 2014-07-12 DIAGNOSIS — I10 Essential (primary) hypertension: Secondary | ICD-10-CM | POA: Diagnosis not present

## 2014-07-12 DIAGNOSIS — Z471 Aftercare following joint replacement surgery: Secondary | ICD-10-CM | POA: Diagnosis not present

## 2014-07-12 DIAGNOSIS — F419 Anxiety disorder, unspecified: Secondary | ICD-10-CM | POA: Diagnosis not present

## 2014-07-12 DIAGNOSIS — M21372 Foot drop, left foot: Secondary | ICD-10-CM | POA: Diagnosis not present

## 2014-07-14 DIAGNOSIS — M21372 Foot drop, left foot: Secondary | ICD-10-CM | POA: Diagnosis not present

## 2014-07-14 DIAGNOSIS — F419 Anxiety disorder, unspecified: Secondary | ICD-10-CM | POA: Diagnosis not present

## 2014-07-14 DIAGNOSIS — Z471 Aftercare following joint replacement surgery: Secondary | ICD-10-CM | POA: Diagnosis not present

## 2014-07-14 DIAGNOSIS — M1990 Unspecified osteoarthritis, unspecified site: Secondary | ICD-10-CM | POA: Diagnosis not present

## 2014-07-14 DIAGNOSIS — I10 Essential (primary) hypertension: Secondary | ICD-10-CM | POA: Diagnosis not present

## 2014-07-14 DIAGNOSIS — G2581 Restless legs syndrome: Secondary | ICD-10-CM | POA: Diagnosis not present

## 2014-07-15 DIAGNOSIS — I10 Essential (primary) hypertension: Secondary | ICD-10-CM | POA: Diagnosis not present

## 2014-07-15 DIAGNOSIS — R3915 Urgency of urination: Secondary | ICD-10-CM | POA: Diagnosis not present

## 2014-07-15 DIAGNOSIS — M1990 Unspecified osteoarthritis, unspecified site: Secondary | ICD-10-CM | POA: Diagnosis not present

## 2014-07-15 DIAGNOSIS — J329 Chronic sinusitis, unspecified: Secondary | ICD-10-CM | POA: Diagnosis not present

## 2014-07-15 DIAGNOSIS — G2581 Restless legs syndrome: Secondary | ICD-10-CM | POA: Diagnosis not present

## 2014-07-15 DIAGNOSIS — F419 Anxiety disorder, unspecified: Secondary | ICD-10-CM | POA: Diagnosis not present

## 2014-07-15 DIAGNOSIS — F17200 Nicotine dependence, unspecified, uncomplicated: Secondary | ICD-10-CM | POA: Diagnosis not present

## 2014-07-15 DIAGNOSIS — M21372 Foot drop, left foot: Secondary | ICD-10-CM | POA: Diagnosis not present

## 2014-07-15 DIAGNOSIS — Z471 Aftercare following joint replacement surgery: Secondary | ICD-10-CM | POA: Diagnosis not present

## 2014-07-18 DIAGNOSIS — Z471 Aftercare following joint replacement surgery: Secondary | ICD-10-CM | POA: Diagnosis not present

## 2014-07-18 DIAGNOSIS — Z96641 Presence of right artificial hip joint: Secondary | ICD-10-CM | POA: Diagnosis not present

## 2014-08-04 DIAGNOSIS — R202 Paresthesia of skin: Secondary | ICD-10-CM | POA: Diagnosis not present

## 2014-08-04 DIAGNOSIS — F410 Panic disorder [episodic paroxysmal anxiety] without agoraphobia: Secondary | ICD-10-CM | POA: Diagnosis not present

## 2014-08-04 DIAGNOSIS — I10 Essential (primary) hypertension: Secondary | ICD-10-CM | POA: Diagnosis not present

## 2014-08-04 DIAGNOSIS — R739 Hyperglycemia, unspecified: Secondary | ICD-10-CM | POA: Diagnosis not present

## 2014-08-15 DIAGNOSIS — D649 Anemia, unspecified: Secondary | ICD-10-CM | POA: Diagnosis not present

## 2014-08-15 DIAGNOSIS — G2581 Restless legs syndrome: Secondary | ICD-10-CM | POA: Diagnosis not present

## 2014-08-15 DIAGNOSIS — Z23 Encounter for immunization: Secondary | ICD-10-CM | POA: Diagnosis not present

## 2014-08-15 DIAGNOSIS — J019 Acute sinusitis, unspecified: Secondary | ICD-10-CM | POA: Diagnosis not present

## 2014-09-12 DIAGNOSIS — F17200 Nicotine dependence, unspecified, uncomplicated: Secondary | ICD-10-CM | POA: Insufficient documentation

## 2014-09-12 DIAGNOSIS — C182 Malignant neoplasm of ascending colon: Secondary | ICD-10-CM | POA: Diagnosis not present

## 2014-09-12 DIAGNOSIS — K529 Noninfective gastroenteritis and colitis, unspecified: Secondary | ICD-10-CM | POA: Diagnosis not present

## 2014-09-12 DIAGNOSIS — D649 Anemia, unspecified: Secondary | ICD-10-CM | POA: Diagnosis not present

## 2014-09-13 DIAGNOSIS — N3946 Mixed incontinence: Secondary | ICD-10-CM | POA: Diagnosis not present

## 2014-09-19 ENCOUNTER — Other Ambulatory Visit: Payer: Self-pay | Admitting: Unknown Physician Specialty

## 2014-09-19 DIAGNOSIS — R197 Diarrhea, unspecified: Secondary | ICD-10-CM | POA: Diagnosis not present

## 2014-09-19 DIAGNOSIS — R899 Unspecified abnormal finding in specimens from other organs, systems and tissues: Secondary | ICD-10-CM | POA: Diagnosis not present

## 2014-09-21 DIAGNOSIS — D649 Anemia, unspecified: Secondary | ICD-10-CM | POA: Diagnosis not present

## 2014-09-21 DIAGNOSIS — K529 Noninfective gastroenteritis and colitis, unspecified: Secondary | ICD-10-CM | POA: Diagnosis not present

## 2014-09-29 ENCOUNTER — Ambulatory Visit
Admit: 2014-09-29 | Disposition: A | Discharge status: Home / Self Care | Payer: Self-pay | Attending: Internal Medicine | Admitting: Internal Medicine

## 2014-10-14 ENCOUNTER — Ambulatory Visit
Admit: 2014-10-14 | Disposition: A | Discharge status: Home / Self Care | Payer: Self-pay | Attending: Internal Medicine | Admitting: Internal Medicine

## 2014-10-14 LAB — HEPATIC FUNCTION PANEL A (ARMC)
ALBUMIN: 3.9 g/dL
ALK PHOS: 101 U/L
Bilirubin,Total: 0.5 mg/dL
SGOT(AST): 23 U/L
SGPT (ALT): 18 U/L
Total Protein: 7.3 g/dL

## 2014-10-14 LAB — CBC CANCER CENTER
BASOS ABS: 0 x10 3/mm (ref 0.0–0.1)
Basophil %: 0.5 %
EOS ABS: 0.7 x10 3/mm (ref 0.0–0.7)
Eosinophil %: 8.8 %
HCT: 40.9 % (ref 35.0–47.0)
HGB: 13.6 g/dL (ref 12.0–16.0)
LYMPHS ABS: 1.7 x10 3/mm (ref 1.0–3.6)
Lymphocyte %: 22.6 %
MCH: 27.6 pg (ref 26.0–34.0)
MCHC: 33.2 g/dL (ref 32.0–36.0)
MCV: 83 fL (ref 80–100)
Monocyte #: 0.4 x10 3/mm (ref 0.2–0.9)
Monocyte %: 5.8 %
Neutrophil #: 4.7 x10 3/mm (ref 1.4–6.5)
Neutrophil %: 62.3 %
Platelet: 200 x10 3/mm (ref 150–440)
RBC: 4.93 10*6/uL (ref 3.80–5.20)
RDW: 21.1 % — ABNORMAL HIGH (ref 11.5–14.5)
WBC: 7.6 x10 3/mm (ref 3.6–11.0)

## 2014-10-14 LAB — IRON AND TIBC
IRON: 70 ug/dL
Iron Bind.Cap.(Total): 478 — ABNORMAL HIGH (ref 250–450)
Iron Saturation: 14.6
UNBOUND IRON-BIND. CAP.: 408.1

## 2014-10-14 LAB — FERRITIN: Ferritin (ARMC): 16 ng/mL

## 2014-10-14 LAB — CALCIUM: CALCIUM: 10.5 mg/dL — AB

## 2014-10-14 LAB — CREATININE, SERUM
Creatinine: 0.52 mg/dL
EGFR (Non-African Amer.): >60

## 2014-10-17 ENCOUNTER — Ambulatory Visit
Admit: 2014-10-17 | Disposition: A | Discharge status: Home / Self Care | Payer: Self-pay | Attending: Unknown Physician Specialty | Admitting: Unknown Physician Specialty

## 2014-10-17 DIAGNOSIS — Z85038 Personal history of other malignant neoplasm of large intestine: Secondary | ICD-10-CM | POA: Diagnosis not present

## 2014-10-17 DIAGNOSIS — Z8673 Personal history of transient ischemic attack (TIA), and cerebral infarction without residual deficits: Secondary | ICD-10-CM | POA: Diagnosis not present

## 2014-10-17 DIAGNOSIS — K529 Noninfective gastroenteritis and colitis, unspecified: Secondary | ICD-10-CM | POA: Diagnosis not present

## 2014-10-17 DIAGNOSIS — K640 First degree hemorrhoids: Secondary | ICD-10-CM | POA: Diagnosis not present

## 2014-10-17 DIAGNOSIS — Z885 Allergy status to narcotic agent status: Secondary | ICD-10-CM | POA: Diagnosis not present

## 2014-10-17 DIAGNOSIS — J329 Chronic sinusitis, unspecified: Secondary | ICD-10-CM | POA: Diagnosis not present

## 2014-10-17 DIAGNOSIS — Z882 Allergy status to sulfonamides status: Secondary | ICD-10-CM | POA: Diagnosis not present

## 2014-10-17 DIAGNOSIS — F17200 Nicotine dependence, unspecified, uncomplicated: Secondary | ICD-10-CM | POA: Diagnosis not present

## 2014-10-17 DIAGNOSIS — I10 Essential (primary) hypertension: Secondary | ICD-10-CM | POA: Diagnosis not present

## 2014-10-17 DIAGNOSIS — Z9889 Other specified postprocedural states: Secondary | ICD-10-CM | POA: Diagnosis not present

## 2014-10-17 DIAGNOSIS — Z8614 Personal history of Methicillin resistant Staphylococcus aureus infection: Secondary | ICD-10-CM | POA: Diagnosis not present

## 2014-10-17 DIAGNOSIS — D649 Anemia, unspecified: Secondary | ICD-10-CM | POA: Diagnosis not present

## 2014-10-17 DIAGNOSIS — M1990 Unspecified osteoarthritis, unspecified site: Secondary | ICD-10-CM | POA: Diagnosis not present

## 2014-10-17 DIAGNOSIS — Z888 Allergy status to other drugs, medicaments and biological substances status: Secondary | ICD-10-CM | POA: Diagnosis not present

## 2014-10-17 DIAGNOSIS — R011 Cardiac murmur, unspecified: Secondary | ICD-10-CM | POA: Diagnosis not present

## 2014-10-17 DIAGNOSIS — Z9049 Acquired absence of other specified parts of digestive tract: Secondary | ICD-10-CM | POA: Diagnosis not present

## 2014-10-17 DIAGNOSIS — G2581 Restless legs syndrome: Secondary | ICD-10-CM | POA: Diagnosis not present

## 2014-10-17 DIAGNOSIS — Z79899 Other long term (current) drug therapy: Secondary | ICD-10-CM | POA: Diagnosis not present

## 2014-10-17 DIAGNOSIS — F410 Panic disorder [episodic paroxysmal anxiety] without agoraphobia: Secondary | ICD-10-CM | POA: Diagnosis not present

## 2014-10-17 DIAGNOSIS — Z980 Intestinal bypass and anastomosis status: Secondary | ICD-10-CM | POA: Diagnosis not present

## 2014-10-17 LAB — PROT IMMUNOELECTROPHORES(ARMC)

## 2014-10-17 LAB — URINE IEP, RANDOM

## 2014-10-30 NOTE — H&P (Signed)
PATIENT NAME:  Ann Silva, SPEGAL MR#:  [id number] DATE OF BIRTH:  [birth year]  DATE OF ADMISSION:  07/24/2011  PRIMARY CARE PHYSICIAN: Golden Pop, MD  ORTHOPEDIST: Located in [place] and Dr. Skip Estimable at [facility].  CHIEF COMPLAINT: Acute on chronic left hip pain for two days.   HISTORY OF PRESENT ILLNESS: Ann Silva is a 69 year old Caucasian female who has history of chronic left hip pain and history of hypertension who comes to the Emergency Room after she started noticing some acute on chronic left hip pain. She mainly complains of left thigh stretching and some swelling around the area. She denies any fever, nausea, or vomiting. She denies any color change over the skin or any skin breakdown on the left hip.   The patient has history of chronic left hip pain. She has had multiple surgeries of her left hip after she underwent final left hip prosthesis/hardware removal in September 2012 in [place], [place]. 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 She has been since using a walker and scooter for ambulating. She is minimal to partial weight-bearing on the left hip. The patient is being admitted for further evaluation and management. She received a dose of vancomycin in the Emergency Room.   PAST MEDICAL HISTORY:  1. Chronic left hip pain, status post prosthesis/hardware removal secondary to MSSA bacteremia and infection of the prosthesis. The prosthesis was removed in [place], [place] in September 2012.  2. History of hypertension.  3. Urinary urgency.  4. History of spinal fusion.  5. Colon cancer status post partial colectomy.  6. Status post left hip surgery with multiple revisions and removal of left hip prosthesis in September 2012 after she had infection.  7. Status post cholecystectomy.  8. Status post hysterectomy.   ALLERGIES: Sulfa,  prednisone, Benadryl, and codeine.   SOCIAL HISTORY: She used to smoke but not recently. No history of alcohol use. She lives with her husband at home.   FAMILY HISTORY: Positive for hypertension and coronary artery disease.  MEDICATIONS:  1. Ropinirole 2 mg four times daily. 2. Metoprolol 50 mg daily.  3. Naproxen 500 mg twice a day. 4. Hydrochlorothiazide 25 mg daily.  5. Benazepril 40 mg daily.  6. Sertraline 100 mg daily.  7. Oxybutynin 5 mg four times daily. 8. Calcium with vitamin D 600 mg twice a day.  REVIEW OF SYSTEMS: CONSTITUTIONAL: No fever. Positive for weakness and left hip pain. EYES: No blurred or double vision. No glaucoma. ENT: No tinnitus, ear pain, or hearing loss. RESPIRATORY: No cough, wheeze, or hemoptysis. CARDIOVASCULAR: No chest pain, orthopnea, or edema. GI: No nausea, vomiting, diarrhea, or abdominal pain. GU: No dysuria or hematuria. ENDOCRINE: No polyuria or nocturia. HEMATOLOGY: No anemia or easy bruising. SKIN: No acne or rash. MUSCULOSKELETAL: Positive for acute on chronic left hip pain. The patient has restricted movement of the left hip, which is chronic. NEUROLOGIC: No cerebrovascular accident or transient ischemic attack. PSYCH: No anxiety or depression. All of the other systems are reviewed and negative.   PHYSICAL EXAMINATION:   GENERAL: The patient is awake, alert, and oriented x3.   VITALS: She is afebrile. Pulse is 64, blood pressure 117/61, and saturation 99% on room air.   HEENT: Atraumatic, normocephalic. Pupils are equal, round, and reactive to light and accommodation. Extraocular movements intact. Oral mucosa is moist.   NECK: Supple. No JVD. No  carotid bruit.   LUNGS: Clear to auscultation bilaterally. No rales, rhonchi, respiratory distress, or labored breathing.   HEART: Both heart sounds are normal. Rate and rhythm is regular. PMI is not lateralized. Chest is nontender.   EXTREMITIES: Good pedal pulses. Good femoral pulses. The  patient has restricted range of motion of the left hip and left knee which is chronic. Skin over the left hip looks normal. The patient does have some mild tenderness over the left thigh. I could not feel any palpable mass around in the hip area. Trace lower extremity edema.   ABDOMEN: Soft, benign, and nontender. Positive bowel sounds.   NEUROLOGIC: Cranial nerves II through XII grossly intact.  No motor or sensory deficits.   PSYCH: The patient is awake, alert, and oriented x3.  LABS/STUDIES: Urinalysis: Positive for urinary tract infection.   Chest x-ray: No acute cardiopulmonary disease.   Left hip, AP and lateral:  Interval removal of the left arthroplasty. There is superior subluxation of the left femoral neck relative to the acetabulum.   CBC is within normal limits. Comprehensive metabolic panel is within normal limits, except glucose of 135, BUN 23, creatinine 0.44, and anion gap 18. Troponin is 0.02.   ASSESSMENT: 69 year old Ann Silva with:  1. Acute on chronic left hip pain: The patient does have history of MSSA bacteremia and left hip prosthetic joint removal in September 2012 in [place], [place]. The patient got worried thinking this could be an infection. At present there is no evidence of acute infection noted; however, the patient was started on IV vancomycin. We will continue that for now. Follow blood cultures. Check ESR and CRP. We will obtain orthopedic consultation for evaluation. If needed further imaging in the form of CT. The patient's white count is stable. Follow-up while in-house. She uses a walker and a scooter to get around. We will have physical therapy involved. 2. Hypertension: Continue home medications.  3. Urinary tract infection: We will start the patient on Cipro. Check urine culture.  4. Chronic left hip pain with multiple surgeries of the left hip in the past with hardware removal: We have physical therapy see the patient in consultation as well.    PLAN:  1. Admit the patient to the orthopedic floor.  2. 2 gram sodium diet.  3. IV vancomycin, pharmacy to help with dosing.  4. Follow-up CRP, basic metabolic panel, and ESR.  5. Continue IV fluids for now.  6. Continue all home medications.  7. P.r.n. Ultram for pain along with naproxen.  8. Dr. Mauri Pole was informed of the patient's admission. I will discuss at length later once Dr. Mauri Pole is done with surgery. 9. Deep vein thrombosis prophylaxis with Lovenox.   Further work-up according to the patient's clinical course. The hospital admission plan was discussed with the patient and her husband who are agreeable to it.   TIME SPENT: 50 minutes.  ____________________________ Hart Rochester Posey Pronto, MD sap:slb D: 07/25/2011 08:52:31 ET T: 07/25/2011 09:28:25 ET JOB#: [id number]  cc: Ozzie Remmers A. Posey Pronto, MD, <Dictator> [name], MD Ilda Basset MD ELECTRONICALLY SIGNED 08/02/2011 7:28

## 2014-10-30 NOTE — Discharge Summary (Signed)
PATIENT NAME:  Ann Silva, Ann Silva MR#:  [id number] DATE OF BIRTH:  February 27, 1946  DATE OF ADMISSION:  07/25/2011 DATE OF DISCHARGE:  07/26/2011  ADMITTING DIAGNOSIS: Hip pain.  DISCHARGE DIAGNOSES: 1. Acute on chronic left hip pain.  2. Subluxation of left femoral neck relative to acetabulum.  3. Right heel pain, swelling of Achilles tendon and heel bone, likely overuse related.  4. Urinary tract infection. 5. Anemia. 6. History of hypertension. 7. Restless leg syndrome.   DISCHARGE CONDITION: Stable.   DISCHARGE MEDICATIONS: Patient is to resume her outpatient medications which are:  1. Rocephin 1 gram once daily. 2. Benazepril 40 mg p.o. daily.  3. Hydrochlorothiazide 25 mg p.o. daily.  4. Zoloft 100 mg p.o. daily.  5. Flonase 0.05 mg inhalation nasal spray 2 sprays daily.  6. Metoprolol 50 mg p.o. daily.  7. Requip 3 mg p.o. every six hours.  8. Naproxen 500 mg p.o. twice daily.  9. Iron sulfate 325 mg p.o. twice daily.  10. Calcium 600 plus vitamin D 200 units 2 tablets once daily.  11. Oxybutynin 5 mg p.o. 4 times daily.   ADDITIONAL MEDICATION: Percocet 5/325 mg 1 tablet every six hours as needed.   HOME OXYGEN: None.   DIET: 2 gram salt, low fat. Patient was advised to lose weight if possible to help her joints.   ACTIVITY LIMITATIONS: Ambulate only when necessary. Patient was also advised to have ice to right heel as needed and modest resting to right lower extremity should be given.   FOLLOW UP: Patient is to follow up with primary care physician, Dr. Jeananne Rama, in two days after discharge as well as patient is to keep her appointment with [facility] orthopedic surgeon on Monday which is 07/29/2011.   CONSULTANTS: None.  LABORATORY, DIAGNOSTIC AND RADIOLOGICAL DATA: Left hip complete x-ray 07/25/2011 showed AP and lateral view of left hip demonstrates interval removal of left arthroplasty. Superior subluxation of left femoral neck relative to the acetabulum. Chest one view  x-ray 07/25/2011 revealed no acute disease of chest.   Patient's lab data showed elevated glucose to 135, BUN and creatinine 23 and 0.44, otherwise unremarkable BMP. Patient's liver enzymes were normal. Cardiac enzymes, troponin less than 0.02. CBC was within normal limits with white blood cell count 6.8, hemoglobin 12.5, platelets 195. Absolute neutrophil count was also normal at 4.0. Patient's erythrocyte sedimentation rate was normal at 21 and CRP was also normal. Blood cultures x2 taken 07/25/2011 showed no growth. Urinalysis was remarkable for yellow cloudy urine, negative for glucose, bilirubin or ketones, specific gravity 1.020, pH 5.0, 1+ blood, negative for protein, positive for nitrites, 2+ leukocyte esterase, 15 red blood cells, 32 white blood cells, 3+ bacteria were noted. C-reactive protein level 2.5. EKG showed normal sinus rhythm at 65 beats per minute, normal axis, no acute ST-T changes were noted. Chest x-ray was unremarkable. Left hip x-ray revealed hardware removal as well as superior subluxation of left femoral neck relative to acetabulum.   HISTORY OF PRESENT ILLNESS: Patient is a 69 year old Caucasian female with past medical history significant for history of chronic left hip pain, history of prosthesis hardware removal secondary to methicillin sensitive Staphylococcus aureus bacteremia and infection of prosthesis in 03/2011, history of hypertension status post left hip surgery in the past with multiple revisions and removal as mentioned above of prosthesis presented to the hospital with complaints of acute on chronic left hip pain for the past two days. Please refer to Dr. Serita Grit admission note on 07/25/2011. Apparently patient  has been noticing acute on chronic left hip pain. Pain was mostly whenever she would stretch her left thigh and she would notice some swelling around the area. She denied any fevers, nausea or vomiting. On arrival to Emergency Room she was afebrile, pulse 64,  blood pressure 117/61, saturation 99% on room air. Physical examination: Patient had restricted range of motion in left hip as well as left knee which according to admitting physician was chronic. Skin over left hip looked normal. There was some tenderness over left thigh. No palpable masses   HOSPITAL COURSE: Patient was admitted to the hospital for further evaluation. On 07/26/2011 she does not complain of any hip pains or thigh pains anymore. She doesn't  have any swelling in the legs. She does have some swelling and thickness of right Achilles tendon at the insertion of heel bone. No tenderness on palpation of that area, however, noted. Her vitals are stable with temperature 98, pulse 68, respiration rate 20, blood pressure 121/70, saturation 93% on room air at rest. As patient's blood cultures were unremarkable and there was no swelling or any more pain it was felt that acute on chronic left hip pain should be treated with pain medications alone. There was no sign of infection. Her CRP as well erythrocyte sedimentation rate were within normal limits. Patient was advised to continue current therapy as she was prescribed. She is to follow up with orthopedic surgery in [place] in the next few days after discharge. Meanwhile she is to allow her hip to rest as much as possible. This was discussed with Dr. Marry Guan and he was in agreement that no other studies are necessary at this time. In regards to right heel discomfort and swelling at Achilles tendon, it was felt to be overuse related and patient was advised to give to rest to her right lower extremity as much as possible. She was also advised to continue Percocet as needed and ice to right heel as needed. Patient was noted to have urinary tract infection on arrival to the hospital. She was given antibiotics while she was in the hospital. Unfortunately, I did not prescribed for her antibiotics upon discharge, however, she failed to complain of any urinary tract  problems. It is recommended to restart her on antibiotic therapy if she does have recurrent symptoms. Cultures of urine were taken, however, they were still pending at the time of dictation. In regards to her chronic medical problems such as restless leg syndrome, hypertension, anemia patient is to follow up with her primary care physician for further recommendations. No further work-up was done. Patient was noted to be somewhat hyperglycemic in the hospital with glucose levels ranging from 106 to 135, however, it was unclear if in fact those glucose levels were fasting or not. No other symptomatology was found. Patient is being discharged in stable condition with above-mentioned medications and follow up. Her vitals were dictated above.   TIME SPENT: 40 minutes.  ____________________________ Theodoro Grist, MD rv:cms D: 07/26/2011 20:10:55 ET T: 07/27/2011 12:08:26 ET JOB#: [id number]  cc: Theodoro Grist, MD, <Dictator> [name], MD Why MD ELECTRONICALLY SIGNED 08/05/2011 7:49

## 2014-11-10 DIAGNOSIS — L309 Dermatitis, unspecified: Secondary | ICD-10-CM | POA: Diagnosis not present

## 2014-11-10 DIAGNOSIS — L905 Scar conditions and fibrosis of skin: Secondary | ICD-10-CM | POA: Diagnosis not present

## 2015-01-23 ENCOUNTER — Emergency Department: Payer: Medicare Other

## 2015-01-23 ENCOUNTER — Encounter: Payer: Self-pay | Admitting: Emergency Medicine

## 2015-01-23 ENCOUNTER — Emergency Department
Admission: EM | Admit: 2015-01-23 | Discharge: 2015-01-24 | Disposition: A | Discharge status: Home / Self Care | Payer: Medicare Other | Attending: Emergency Medicine | Admitting: Emergency Medicine

## 2015-01-23 DIAGNOSIS — Z79899 Other long term (current) drug therapy: Secondary | ICD-10-CM | POA: Insufficient documentation

## 2015-01-23 DIAGNOSIS — M545 Low back pain, unspecified: Secondary | ICD-10-CM

## 2015-01-23 DIAGNOSIS — Z720 Tobacco use: Secondary | ICD-10-CM | POA: Insufficient documentation

## 2015-01-23 DIAGNOSIS — Z7951 Long term (current) use of inhaled steroids: Secondary | ICD-10-CM | POA: Insufficient documentation

## 2015-01-23 LAB — CBC WITH DIFFERENTIAL/PLATELET
BASOS ABS: 0 10*3/uL (ref 0–0.1)
BASOS PCT: 1 %
Eosinophils Absolute: 0.4 10*3/uL (ref 0–0.7)
Eosinophils Relative: 6 %
HCT: 39.5 % (ref 35.0–47.0)
Hemoglobin: 13.3 g/dL (ref 12.0–16.0)
Lymphocytes Relative: 28 %
Lymphs Abs: 1.8 10*3/uL (ref 1.0–3.6)
MCH: 30.4 pg (ref 26.0–34.0)
MCHC: 33.6 g/dL (ref 32.0–36.0)
MCV: 90.5 fL (ref 80.0–100.0)
MONOS PCT: 8 %
Monocytes Absolute: 0.5 10*3/uL (ref 0.2–0.9)
NEUTROS ABS: 3.7 10*3/uL (ref 1.4–6.5)
Neutrophils Relative %: 57 %
Platelets: 176 10*3/uL (ref 150–440)
RBC: 4.37 MIL/uL (ref 3.80–5.20)
RDW: 14.3 % (ref 11.5–14.5)
WBC: 6.4 10*3/uL (ref 3.6–11.0)

## 2015-01-23 MED ORDER — DIAZEPAM 5 MG PO TABS
10.0000 mg | ORAL_TABLET | Freq: Once | ORAL | Status: AC
  Administered 2015-01-23: 10 mg via ORAL
  Filled 2015-01-23: qty 2

## 2015-01-23 MED ORDER — KETOROLAC TROMETHAMINE 30 MG/ML IJ SOLN
30.0000 mg | Freq: Once | INTRAMUSCULAR | Status: AC
  Administered 2015-01-23: 30 mg via INTRAMUSCULAR

## 2015-01-23 MED ORDER — BACLOFEN 10 MG PO TABS: 10.0000 mg | ORAL_TABLET | Freq: Three times a day (TID) | ORAL | Status: DC

## 2015-01-23 MED ORDER — KETOROLAC TROMETHAMINE 30 MG/ML IJ SOLN
30.0000 mg | Freq: Once | INTRAMUSCULAR | Status: DC
  Filled 2015-01-23: qty 1

## 2015-01-23 NOTE — ED Provider Notes (Signed)
[facility] Emergency Department Provider Note  ____________________________________________  Time seen: Approximately 9:57 PM  I have reviewed the triage vital signs and the nursing notes.   HISTORY  Chief Complaint Back Pain    HPI ZALEIGH BERMINGHAM is a 69 y.o. female who presents for evaluation of her back pain times weeks. Patient states that the pain radiates down into her legs but denies any injury. History of back pain and surgery many years ago. Pain increases with weightbearing and ambulation. Patient also states that she's got a history of low blood on iron pills now, feeling weak.   Past Medical History  Diagnosis Date  . Infection of prosthetic total hip joint   . MSSA (methicillin susceptible Staphylococcus aureus) infection   . 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 Colon cancer   . Anemia   . Depression     Patient Active Problem List   Diagnosis Date Noted  . Infection of prosthetic total hip joint   . MSSA (methicillin susceptible Staphylococcus aureus) infection   . 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 Colon cancer   . Depression   . History of repair of hip joint 10/25/2013  . 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 Total hip arthroplasty    . Cholecystectomy    . Colon surgery    . Abdominal hysterectomy    . Revision of total hip arthroplasty    . Resection total hip      Current Outpatient Rx  Name  Route  Sig  Dispense  Refill  . baclofen (LIORESAL) 10 MG tablet   Oral   Take 1 tablet (10 mg total) by mouth 3 (three) times daily.   30 each   0   . benazepril (LOTENSIN) 40 MG tablet   Oral   Take 40 mg by mouth daily.         . fluocinonide-emollient (LIDEX-E) 0.05 % cream   Topical   Apply 1 application topically 2 (two) times daily.          . fluticasone (CUTIVATE) 0.05 % cream   Topical   Apply 1 application topically 2 (two) times daily.         . fluticasone (FLONASE) 50 MCG/ACT nasal spray   Each Nare   Place 1 spray into both nostrils daily.         . hydrochlorothiazide (HYDRODIURIL) 25 MG tablet   Oral   Take 25 mg by mouth daily.         Marland Kitchen METOPROLOL SUCCINATE ER PO   Oral   Take 50 mg by mouth daily.         . naproxen (NAPROSYN) 500 MG tablet   Oral   Take 500 mg by mouth 2 (two) times daily with a meal.         . sertraline (ZOLOFT) 100 MG tablet   Oral   Take 100 mg by mouth daily.           Allergies Benadryl; Codeine; Methadone; Prednisone; Sulfa antibiotics; and Tussionex pennkinetic er  Family History  Problem Relation Age of Onset  . Diabetes Sister     Social History History  Substance Use Topics  . Smoking status: Current Every Day Smoker -- 0.50 packs/day    Types: Cigarettes  Start date: 07/08/1964  . Smokeless tobacco: Never Used  . Alcohol Use: No    Review of Systems Constitutional: No fever/chills, feels weak. Eyes: No visual changes. ENT: No sore throat. Cardiovascular: Denies chest pain. Respiratory: Denies shortness of breath. Gastrointestinal: No abdominal pain.  No nausea, no vomiting.  No diarrhea.  No constipation. Genitourinary: Negative for dysuria. Musculoskeletal: Positive for low back pain. Skin: Negative for rash. Neurological: Negative for headaches, focal weakness or numbness.  10-point ROS otherwise negative.  ____________________________________________   PHYSICAL EXAM:  VITAL SIGNS: ED Triage Vitals  Enc Vitals Group     BP 01/23/15 1949 130/78 mmHg     Pulse Rate 01/23/15 1949 94     Resp 01/23/15 1949 18     Temp 01/23/15 1949 99 F (37.2 C)     Temp Source 01/23/15 1949 Oral     SpO2 01/23/15 1949 95 %     Weight 01/23/15 1949 174 lb (78.926 kg)     Height 01/23/15 1949 5\' 1"  (1.549 m)     Head Cir --      Peak Flow  --      Pain Score 01/23/15 1948 8     Pain Loc --      Pain Edu? --      Excl. in [place]? --     Constitutional: Alert and oriented. Well appearing and in no acute distress. Eyes: Conjunctivae are normal. PERRL. EOMI. Head: Atraumatic. Nose: No congestion/rhinnorhea. Mouth/Throat: Mucous membranes are moist.  Oropharynx non-erythematous. Neck: No stridor.   Cardiovascular: Normal rate, regular rhythm. Grossly normal heart sounds.  Good peripheral circulation. Respiratory: Normal respiratory effort.  No retractions. Lungs CTAB. Musculoskeletal: No lower extremity tenderness nor edema.  No joint effusions. Point tenderness to lumbar spine area. Neurologic:  Normal speech and language. No gross focal neurologic deficits are appreciated. No gait instability. Skin:  Skin is warm, dry and intact. No rash noted. Psychiatric: Mood and affect are normal. Speech and behavior are normal.  ____________________________________________   LABS (all labs ordered are listed, but only abnormal results are displayed)  Labs Reviewed  CBC WITH DIFFERENTIAL/PLATELET   ____________________________________________  RADIOLOGY  Hardware noticed in  lumbar spine from previous surgery. Degenerative changes noted throughout. Interpreted by myself. ____________________________________________   PROCEDURES  Procedure(s) performed: None  Critical Care performed: No  ____________________________________________   INITIAL IMPRESSION / ASSESSMENT AND PLAN / ED COURSE  Pertinent labs & imaging results that were available during my care of the patient were reviewed by me and considered in my medical decision making (see chart for details).  Anemia by history, none now. Low back pain which is chronic in nature. Patient reports improvement with Toradol 30 mg and Valium 10 mg. She will follow-up with her oncologist as scheduled and her PCP as directed. She voices no other emergency medical complaints at this  time. Rx given for baclofen 10 mg 3 times a day. ____________________________________________   FINAL CLINICAL IMPRESSION(S) / ED DIAGNOSES  Final diagnoses:  Back pain at L4-L5 level      Arlyss Repress, PA-C 01/24/15 0031  Lisa Roca, MD 01/29/15 [phone]

## 2015-01-23 NOTE — Discharge Instructions (Signed)

## 2015-01-23 NOTE — ED Notes (Addendum)
Patient ambulatory to triage with steady gait, without difficulty or distress noted; pt reports lower back pain x week radiating into legs; denies any injury; st hx back surgery many years ago with multiple hip surgeries; pt pain increases with weight bearing

## 2015-02-16 DIAGNOSIS — M5416 Radiculopathy, lumbar region: Secondary | ICD-10-CM | POA: Diagnosis not present

## 2015-02-16 DIAGNOSIS — G8929 Other chronic pain: Secondary | ICD-10-CM | POA: Diagnosis not present

## 2015-02-16 DIAGNOSIS — M25552 Pain in left hip: Secondary | ICD-10-CM | POA: Diagnosis not present

## 2015-02-22 ENCOUNTER — Telehealth: Payer: Self-pay | Admitting: Family Medicine

## 2015-02-22 DIAGNOSIS — I10 Essential (primary) hypertension: Secondary | ICD-10-CM | POA: Insufficient documentation

## 2015-02-22 NOTE — Telephone Encounter (Signed)
Patient needs appointment, but Metoprolol was filled in May for 90 days one refill, she should call the pharmacy

## 2015-02-22 NOTE — Telephone Encounter (Signed)
Pt called needs refill on Metoprolol. Pharm is [facility]. Fax # [phone]. Thanks.

## 2015-02-23 ENCOUNTER — Encounter: Payer: Self-pay | Admitting: Family Medicine

## 2015-02-23 NOTE — Telephone Encounter (Signed)
Pt scheduled for 02/27/15. Thanks.

## 2015-02-27 ENCOUNTER — Encounter: Payer: Self-pay | Admitting: Family Medicine

## 2015-02-27 ENCOUNTER — Ambulatory Visit (INDEPENDENT_AMBULATORY_CARE_PROVIDER_SITE_OTHER): Payer: Medicare Other | Admitting: Family Medicine

## 2015-02-27 VITALS — BP 122/76 | HR 73 | Temp 98.1°F | Ht 60.6 in | Wt 173.0 lb

## 2015-02-27 DIAGNOSIS — F32A Depression, unspecified: Secondary | ICD-10-CM

## 2015-02-27 DIAGNOSIS — F329 Major depressive disorder, single episode, unspecified: Secondary | ICD-10-CM

## 2015-02-27 DIAGNOSIS — I10 Essential (primary) hypertension: Secondary | ICD-10-CM | POA: Diagnosis not present

## 2015-02-27 DIAGNOSIS — G2581 Restless legs syndrome: Secondary | ICD-10-CM | POA: Diagnosis not present

## 2015-02-27 NOTE — Assessment & Plan Note (Signed)
The current medical regimen is effective;  continue present plan and medications.  

## 2015-02-27 NOTE — Progress Notes (Signed)
BP 122/76 mmHg  Pulse 73  Temp(Src) 98.1 F (36.7 C)  Ht 5' 0.6" (1.539 m)  Wt 173 lb (78.472 kg)  BMI 33.13 kg/m2  SpO2 96%   Subjective:    Patient ID: Ann Silva, female    DOB: 1946-05-01, 69 y.o.   MRN: [id number]  HPI: Ann Silva is a 69 y.o. female  Chief Complaint  Patient presents with  . Anemia   treating patient's anemia helped her restless legs a great deal. Patient still taking iron and doing well. Requip helps her legs a great deal also. Patient's hypertension well controlled no problems with medications takes faithfully without problems Depression stable on Zoloft no side effects. No further panic attacks  Relevant past medical, surgical, family and social history reviewed and updated as indicated. Interim medical history since our last visit reviewed. Allergies and medications reviewed and updated.  Review of Systems  Constitutional: Negative.   Respiratory: Negative.   Cardiovascular: Negative.     Per HPI unless specifically indicated above     Objective:    BP 122/76 mmHg  Pulse 73  Temp(Src) 98.1 F (36.7 C)  Ht 5' 0.6" (1.539 m)  Wt 173 lb (78.472 kg)  BMI 33.13 kg/m2  SpO2 96%  Wt Readings from Last 3 Encounters:  02/27/15 173 lb (78.472 kg)  08/25/14 178 lb (80.74 kg)  01/23/15 174 lb (78.926 kg)    Physical Exam  Constitutional: She is oriented to person, place, and time. She appears well-developed and well-nourished. No distress.  HENT:  Head: Normocephalic and atraumatic.  Right Ear: Hearing normal.  Left Ear: Hearing normal.  Nose: Nose normal.  Eyes: Conjunctivae and lids are normal. Right eye exhibits no discharge. Left eye exhibits no discharge. No scleral icterus.  Cardiovascular: Normal rate, regular rhythm and normal heart sounds.   Pulmonary/Chest: Effort normal and breath sounds normal. No respiratory distress.  Musculoskeletal: Normal range of motion.  Neurological: She is alert and oriented to person,  place, and time.  Skin: Skin is intact. No rash noted.  Psychiatric: She has a normal mood and affect. Her speech is normal and behavior is normal. Judgment and thought content normal. Cognition and memory are normal.    Results for orders placed or performed during the hospital encounter of 01/23/15  CBC with Differential  Result Value Ref Range   WBC 6.4 3.6 - 11.0 K/uL   RBC 4.37 3.80 - 5.20 MIL/uL   Hemoglobin 13.3 12.0 - 16.0 g/dL   HCT 39.5 35.0 - 47.0 %   MCV 90.5 80.0 - 100.0 fL   MCH 30.4 26.0 - 34.0 pg   MCHC 33.6 32.0 - 36.0 g/dL   RDW 14.3 11.5 - 14.5 %   Platelets 176 150 - 440 K/uL   Neutrophils Relative % 57 %   Neutro Abs 3.7 1.4 - 6.5 K/uL   Lymphocytes Relative 28 %   Lymphs Abs 1.8 1.0 - 3.6 K/uL   Monocytes Relative 8 %   Monocytes Absolute 0.5 0.2 - 0.9 K/uL   Eosinophils Relative 6 %   Eosinophils Absolute 0.4 0 - 0.7 K/uL   Basophils Relative 1 %   Basophils Absolute 0.0 0 - 0.1 K/uL      Assessment & Plan:   Problem List Items Addressed This Visit      Cardiovascular and Mediastinum   Hypertension - Primary    The current medical regimen is effective;  continue present plan and medications.  Other   Depression    The current medical regimen is effective;  continue present plan and medications.       Restless legs    The current medical regimen is effective;  continue present plan and medications.           Follow up plan: Return in about 3 months (around 05/30/2015), or if symptoms worsen or fail to improve, for Physical Exam.

## 2015-02-28 ENCOUNTER — Other Ambulatory Visit: Payer: Self-pay | Admitting: Orthopedic Surgery

## 2015-02-28 DIAGNOSIS — M5417 Radiculopathy, lumbosacral region: Secondary | ICD-10-CM

## 2015-03-01 ENCOUNTER — Telehealth: Payer: Self-pay | Admitting: Family Medicine

## 2015-03-01 NOTE — Telephone Encounter (Signed)
Pt called stated she needs a refill on Zoloft. Pharmacy is [facility]. Thanks.

## 2015-03-02 ENCOUNTER — Other Ambulatory Visit: Payer: Self-pay | Admitting: Family Medicine

## 2015-03-02 MED ORDER — SERTRALINE HCL 100 MG PO TABS
100.0000 mg | ORAL_TABLET | Freq: Every day | ORAL | Status: DC
Start: 2015-03-02 — End: 2015-04-20

## 2015-03-07 ENCOUNTER — Ambulatory Visit: Admission: RE | Admit: 2015-03-07 | Payer: Medicare Other | Source: Ambulatory Visit

## 2015-03-14 ENCOUNTER — Emergency Department: Payer: Medicare Other

## 2015-03-14 ENCOUNTER — Emergency Department
Admission: EM | Admit: 2015-03-14 | Discharge: 2015-03-14 | Disposition: A | Discharge status: Home / Self Care | Payer: Medicare Other | Attending: Emergency Medicine | Admitting: Emergency Medicine

## 2015-03-14 ENCOUNTER — Encounter: Payer: Self-pay | Admitting: Emergency Medicine

## 2015-03-14 DIAGNOSIS — Z7951 Long term (current) use of inhaled steroids: Secondary | ICD-10-CM | POA: Insufficient documentation

## 2015-03-14 DIAGNOSIS — Z720 Tobacco use: Secondary | ICD-10-CM | POA: Diagnosis not present

## 2015-03-14 DIAGNOSIS — S7001XA Contusion of right hip, initial encounter: Secondary | ICD-10-CM | POA: Diagnosis not present

## 2015-03-14 DIAGNOSIS — Z79899 Other long term (current) drug therapy: Secondary | ICD-10-CM | POA: Insufficient documentation

## 2015-03-14 DIAGNOSIS — F329 Major depressive disorder, single episode, unspecified: Secondary | ICD-10-CM | POA: Diagnosis not present

## 2015-03-14 DIAGNOSIS — I10 Essential (primary) hypertension: Secondary | ICD-10-CM | POA: Diagnosis not present

## 2015-03-14 DIAGNOSIS — Z791 Long term (current) use of non-steroidal anti-inflammatories (NSAID): Secondary | ICD-10-CM | POA: Diagnosis not present

## 2015-03-14 DIAGNOSIS — S79911A Unspecified injury of right hip, initial encounter: Secondary | ICD-10-CM | POA: Diagnosis present

## 2015-03-14 DIAGNOSIS — Z96651 Presence of right artificial knee joint: Secondary | ICD-10-CM | POA: Insufficient documentation

## 2015-03-14 DIAGNOSIS — E876 Hypokalemia: Secondary | ICD-10-CM | POA: Insufficient documentation

## 2015-03-14 DIAGNOSIS — W1839XA Other fall on same level, initial encounter: Secondary | ICD-10-CM | POA: Insufficient documentation

## 2015-03-14 DIAGNOSIS — Y9289 Other specified places as the place of occurrence of the external cause: Secondary | ICD-10-CM | POA: Diagnosis not present

## 2015-03-14 DIAGNOSIS — Y998 Other external cause status: Secondary | ICD-10-CM | POA: Insufficient documentation

## 2015-03-14 DIAGNOSIS — Y9389 Activity, other specified: Secondary | ICD-10-CM | POA: Diagnosis not present

## 2015-03-14 DIAGNOSIS — M25551 Pain in right hip: Secondary | ICD-10-CM | POA: Diagnosis not present

## 2015-03-14 LAB — BASIC METABOLIC PANEL
Anion gap: 10 (ref 5–15)
BUN: 20 mg/dL (ref 6–20)
CHLORIDE: 103 mmol/L (ref 101–111)
CO2: 26 mmol/L (ref 22–32)
CREATININE: 0.71 mg/dL (ref 0.44–1.00)
Calcium: 9.9 mg/dL (ref 8.9–10.3)
Glucose, Bld: 131 mg/dL — ABNORMAL HIGH (ref 65–99)
POTASSIUM: 3.2 mmol/L — AB (ref 3.5–5.1)
SODIUM: 139 mmol/L (ref 135–145)

## 2015-03-14 LAB — CBC WITH DIFFERENTIAL/PLATELET
Basophils Absolute: 0 10*3/uL (ref 0–0.1)
Basophils Relative: 0 %
Eosinophils Absolute: 0.2 10*3/uL (ref 0–0.7)
Eosinophils Relative: 4 %
HCT: 39.3 % (ref 35.0–47.0)
HEMOGLOBIN: 13.3 g/dL (ref 12.0–16.0)
LYMPHS ABS: 1 10*3/uL (ref 1.0–3.6)
Lymphocytes Relative: 17 %
MCH: 30.6 pg (ref 26.0–34.0)
MCHC: 33.7 g/dL (ref 32.0–36.0)
MCV: 90.6 fL (ref 80.0–100.0)
Monocytes Absolute: 0.3 10*3/uL (ref 0.2–0.9)
Monocytes Relative: 6 %
NEUTROS PCT: 73 %
Neutro Abs: 4.3 10*3/uL (ref 1.4–6.5)
Platelets: 148 10*3/uL — ABNORMAL LOW (ref 150–440)
RBC: 4.34 MIL/uL (ref 3.80–5.20)
RDW: 13.7 % (ref 11.5–14.5)
WBC: 5.9 10*3/uL (ref 3.6–11.0)

## 2015-03-14 MED ORDER — CLINDAMYCIN PHOSPHATE 600 MG/50ML IV SOLN
600.0000 mg | Freq: Once | INTRAVENOUS | Status: AC
  Administered 2015-03-14: 600 mg via INTRAVENOUS
  Filled 2015-03-14: qty 50

## 2015-03-14 MED ORDER — TRAMADOL HCL 50 MG PO TABS: 50.0000 mg | ORAL_TABLET | Freq: Four times a day (QID) | ORAL | Status: DC | PRN

## 2015-03-14 MED ORDER — CLINDAMYCIN HCL 150 MG PO CAPS: 150.0000 mg | ORAL_CAPSULE | Freq: Four times a day (QID) | ORAL | Status: DC

## 2015-03-14 NOTE — ED Provider Notes (Signed)
[facility] Emergency Department Provider Note  ____________________________________________  Time seen: Approximately 7:59 AM  I have reviewed the triage vital signs and the nursing notes.   HISTORY  Chief Complaint Fall    HPI Ann Silva is a 69 y.o. female patient complained of swelling and a burning pain to her right hip secondary to a fall yesterday. Patient had a hip replacement last year on the affected side. Patient states she is able to ambulate since the incident but she's noticed redness and swelling to the lateral aspect of the hip. Patient rating the pain as a 5/10.Patient had infections in the past of the prosthesis which cultured out MRSA. Patient denies any fever since the onset of injury.  Past Medical History  Diagnosis Date  . Infection of prosthetic total hip joint   . MSSA (methicillin susceptible Staphylococcus aureus) infection   . 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 Colon cancer   . Anemia   . Depression   . Hypertension   . Sleep apnea   . Atopic neurodermatitis     Patient Active Problem List   Diagnosis Date Noted  . Restless legs 02/27/2015  . Hypertension   . Infection of prosthetic total hip joint   . MSSA (methicillin susceptible Staphylococcus aureus) infection   . 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 Colon cancer   . Depression   . History of repair of hip joint 10/25/2013  . 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 Total hip arthroplasty    . Cholecystectomy    . Colon surgery    . Abdominal hysterectomy    . Revision of total hip arthroplasty    . Resection total hip    . Joint replacement      Current Outpatient Rx  Name  Route  Sig  Dispense  Refill  . baclofen (LIORESAL) 10 MG tablet   Oral   Take 1 tablet (10 mg total) by mouth  3 (three) times daily. Patient not taking: Reported on 02/27/2015   30 each   0   . benazepril (LOTENSIN) 40 MG tablet   Oral   Take 40 mg by mouth daily.         . clindamycin (CLEOCIN) 150 MG capsule   Oral   Take 1 capsule (150 mg total) by mouth 4 (four) times daily.   40 capsule   0   . fluocinonide-emollient (LIDEX-E) 0.05 % cream   Topical   Apply 1 application topically 2 (two) times daily.         . fluticasone (CUTIVATE) 0.05 % cream   Topical   Apply 1 application topically 2 (two) times daily.         . fluticasone (FLONASE) 50 MCG/ACT nasal spray   Each Nare   Place 1 spray into both nostrils daily.         . hydrochlorothiazide (HYDRODIURIL) 25 MG tablet   Oral   Take 25 mg by mouth daily.         . metoprolol succinate (TOPROL-XL) 50 MG 24 hr tablet               . naproxen (NAPROSYN) 500 MG tablet   Oral   Take 500 mg by mouth 2 (two) times daily with a meal.         .  rOPINIRole (REQUIP) 2 MG tablet   Oral   Take 3 mg by mouth 4 (four) times daily.          Marland Kitchen rOPINIRole (REQUIP) 3 MG tablet   Oral   Take 3 mg by mouth 4 (four) times daily.         . sertraline (ZOLOFT) 100 MG tablet   Oral   Take 1 tablet (100 mg total) by mouth daily.   90 tablet   4   . tolterodine (DETROL LA) 4 MG 24 hr capsule                 Allergies Chlorhexidine; Benadryl; Codeine; Methadone; Prednisone; Sulfa antibiotics; and Tussionex pennkinetic er  Family History  Problem Relation Age of Onset  . Diabetes Sister   . Mental illness Sister   . Cancer Mother   . Stroke Father     Social History Social History  Substance Use Topics  . Smoking status: Current Every Day Smoker -- 0.50 packs/day    Types: Cigarettes    Start date: 07/08/1964  . Smokeless tobacco: Never Used  . Alcohol Use: No    Review of Systems Constitutional: No fever/chills Eyes: No visual changes. ENT: No sore throat. Cardiovascular: Denies chest  pain. Respiratory: Denies shortness of breath. Gastrointestinal: No abdominal pain.  No nausea, no vomiting.  No diarrhea.  No constipation. Genitourinary: Negative for dysuria. Musculoskeletal: Negative for back pain. Skin: Negative for rash. Neurological: Negative for headaches, focal weakness or numbness. Psychiatric:Depression Endocrine:Hypertension Hematological/Lymphatic: Allergic/Immunilogical: See medication list  10-point ROS otherwise negative.  ____________________________________________   PHYSICAL EXAM:  VITAL SIGNS: ED Triage Vitals  Enc Vitals Group     BP 03/14/15 0712 122/79 mmHg     Pulse Rate 03/14/15 0712 85     Resp 03/14/15 0712 17     Temp 03/14/15 0712 98.5 F (36.9 C)     Temp Source 03/14/15 0712 Oral     SpO2 --      Weight 03/14/15 0712 173 lb (78.472 kg)     Height 03/14/15 0712 5' (1.524 m)     Head Cir --      Peak Flow --      Pain Score --      Pain Loc --      Pain Edu? --      Excl. in [place]? --    Constitutional: Alert and oriented. Well appearing and in no acute distress. Eyes: Conjunctivae are normal. PERRL. EOMI. Head: Atraumatic. Nose: No congestion/rhinnorhea. Mouth/Throat: Mucous membranes are moist.  Oropharynx non-erythematous. Neck: No stridor. No cervical spine tenderness to palpation. Hematological/Lymphatic/Immunilogical: No cervical lymphadenopathy. Cardiovascular: Normal rate, regular rhythm. Grossly normal heart sounds.  Good peripheral circulation. Respiratory: Normal respiratory effort.  No retractions. Lungs CTAB. Gastrointestinal: Soft and nontender. No distention. No abdominal bruits. No CVA tenderness. Musculoskeletal: Moderate guarding with palpation of the greater trochanter area right hip Neurologic:  Normal speech and language. No gross focal neurologic deficits are appreciated. No gait instability. Skin:  Erythematous nodule lesion lateral right hip  Psychiatric: Mood and affect are normal. Speech and  behavior are normal.  ____________________________________________   LABS (all labs ordered are listed, but only abnormal results are displayed)  Labs Reviewed  BASIC METABOLIC PANEL - Abnormal; Notable for the following:    Potassium 3.2 (*)    Glucose, Bld 131 (*)    All other components within normal limits  CBC WITH DIFFERENTIAL/PLATELET - Abnormal; Notable for the following:  Platelets 148 (*)    All other components within normal limits   ____________________________________________  EKG   ____________________________________________  RADIOLOGY  X-rays revealed no acute findings. I, Sable Feil, personally viewed and evaluated these images (plain radiographs) as part of my medical decision making.  Ultrasound showed a narrow fluid collection area adjacent to subcutaneous fat over the right hip.   ____________________________________________   PROCEDURES  Procedure(s) performed: None  Critical Care performed: No  ____________________________________________   INITIAL IMPRESSION / ASSESSMENT AND PLAN / ED COURSE  Pertinent labs & imaging results that were available during my care of the patient were reviewed by me and considered in my medical decision making (see chart for details).  Erythematous hematoma right hip. Due to the patient's history and medical problems. Clindamycin 600 mg IV in the ER followed by oral medication for 10 days. Patient labs also show hyperkalemia which is mild and advise her to follow-up family doctor for further evaluation of this finding. Patient advised return by ER for condition worsens. ____________________________________________   FINAL CLINICAL IMPRESSION(S) / ED DIAGNOSES  Final diagnoses:  Traumatic hematoma of hip, right, initial encounter  Hematoma of right hip, initial encounter  Hypokalemia due to inadequate potassium intake      Sable Feil, PA-C 03/14/15 0957  Orbie Pyo, MD 03/14/15  [phone]

## 2015-03-14 NOTE — ED Notes (Signed)
Discharge paperwork reviewed with pt. Pt verbalizes understanding at this time. E-sign not working.

## 2015-03-14 NOTE — ED Notes (Signed)
Reports falling yesterday, right hip pain.  Hx of surgery on that hip.  Ambulates to triage well.

## 2015-03-14 NOTE — ED Notes (Signed)
Describes as pain as burning type pain

## 2015-03-28 ENCOUNTER — Ambulatory Visit
Admission: RE | Admit: 2015-03-28 | Discharge: 2015-03-28 | Disposition: A | Discharge status: Home / Self Care | Payer: Medicare Other | Source: Ambulatory Visit | Attending: Orthopedic Surgery | Admitting: Orthopedic Surgery

## 2015-03-28 DIAGNOSIS — M4806 Spinal stenosis, lumbar region: Secondary | ICD-10-CM | POA: Diagnosis not present

## 2015-03-28 DIAGNOSIS — M5416 Radiculopathy, lumbar region: Secondary | ICD-10-CM | POA: Insufficient documentation

## 2015-03-28 DIAGNOSIS — M5417 Radiculopathy, lumbosacral region: Secondary | ICD-10-CM

## 2015-03-28 MED ORDER — GADOBENATE DIMEGLUMINE 529 MG/ML IV SOLN
15.0000 mL | Freq: Once | INTRAVENOUS | Status: AC | PRN
  Administered 2015-03-28: 15 mL via INTRAVENOUS

## 2015-03-30 DIAGNOSIS — M5416 Radiculopathy, lumbar region: Secondary | ICD-10-CM | POA: Diagnosis not present

## 2015-03-30 DIAGNOSIS — M4806 Spinal stenosis, lumbar region: Secondary | ICD-10-CM | POA: Diagnosis not present

## 2015-04-02 DIAGNOSIS — M48061 Spinal stenosis, lumbar region without neurogenic claudication: Secondary | ICD-10-CM | POA: Insufficient documentation

## 2015-04-07 DIAGNOSIS — M4806 Spinal stenosis, lumbar region: Secondary | ICD-10-CM | POA: Diagnosis not present

## 2015-04-07 DIAGNOSIS — M5416 Radiculopathy, lumbar region: Secondary | ICD-10-CM | POA: Diagnosis not present

## 2015-04-20 ENCOUNTER — Ambulatory Visit (INDEPENDENT_AMBULATORY_CARE_PROVIDER_SITE_OTHER): Payer: Medicare Other | Admitting: Family Medicine

## 2015-04-20 ENCOUNTER — Encounter: Payer: Self-pay | Admitting: Family Medicine

## 2015-04-20 VITALS — BP 137/82 | HR 63 | Temp 99.0°F | Ht 60.2 in | Wt 176.0 lb

## 2015-04-20 DIAGNOSIS — Z23 Encounter for immunization: Secondary | ICD-10-CM

## 2015-04-20 DIAGNOSIS — Z0000 Encounter for general adult medical examination without abnormal findings: Secondary | ICD-10-CM

## 2015-04-20 DIAGNOSIS — I10 Essential (primary) hypertension: Secondary | ICD-10-CM

## 2015-04-20 DIAGNOSIS — G2581 Restless legs syndrome: Secondary | ICD-10-CM

## 2015-04-20 DIAGNOSIS — F329 Major depressive disorder, single episode, unspecified: Secondary | ICD-10-CM | POA: Diagnosis not present

## 2015-04-20 DIAGNOSIS — F32A Depression, unspecified: Secondary | ICD-10-CM

## 2015-04-20 LAB — URINALYSIS, ROUTINE W REFLEX MICROSCOPIC
BILIRUBIN UA: NEGATIVE
GLUCOSE, UA: NEGATIVE
KETONES UA: NEGATIVE
Leukocytes, UA: NEGATIVE
Nitrite, UA: NEGATIVE
SPEC GRAV UA: 1.025 (ref 1.005–1.030)
UUROB: 0.2 mg/dL (ref 0.2–1.0)
pH, UA: 5 (ref 5.0–7.5)

## 2015-04-20 LAB — MICROSCOPIC EXAMINATION: Renal Epithel, UA: NONE SEEN /hpf

## 2015-04-20 MED ORDER — TOLTERODINE TARTRATE ER 4 MG PO CP24: 4.0000 mg | ORAL_CAPSULE | Freq: Every day | ORAL | Status: DC

## 2015-04-20 MED ORDER — METOPROLOL SUCCINATE ER 50 MG PO TB24: 50.0000 mg | ORAL_TABLET | Freq: Every day | ORAL | Status: DC

## 2015-04-20 MED ORDER — ROPINIROLE HCL 3 MG PO TABS: 3.0000 mg | ORAL_TABLET | Freq: Four times a day (QID) | ORAL | Status: DC

## 2015-04-20 MED ORDER — NAPROXEN 500 MG PO TABS: 500.0000 mg | ORAL_TABLET | Freq: Two times a day (BID) | ORAL | Status: DC

## 2015-04-20 MED ORDER — BENAZEPRIL HCL 40 MG PO TABS: 40.0000 mg | ORAL_TABLET | Freq: Every day | ORAL | Status: DC

## 2015-04-20 MED ORDER — HYDROCHLOROTHIAZIDE 25 MG PO TABS: 25.0000 mg | ORAL_TABLET | Freq: Every day | ORAL | Status: DC

## 2015-04-20 MED ORDER — SERTRALINE HCL 100 MG PO TABS: 150.0000 mg | ORAL_TABLET | Freq: Every day | ORAL | Status: DC

## 2015-04-20 NOTE — Assessment & Plan Note (Signed)
Discuss worsening symptoms will increase Zoloft to 150 mg a day

## 2015-04-20 NOTE — Assessment & Plan Note (Signed)
Stable for now

## 2015-04-20 NOTE — Progress Notes (Signed)
BP 137/82 mmHg  Pulse 63  Temp(Src) 99 F (37.2 C)  Ht 5' 0.2" (1.529 m)  Wt 176 lb (79.833 kg)  BMI 34.15 kg/m2  SpO2 96%   Subjective:    Patient ID: Ann Silva, female    DOB: 1946-02-05, 69 y.o.   MRN: [id number]  HPI: Ann Silva is a 69 y.o. female  Chief Complaint  Patient presents with  . Annual Exam   Patient with a lot going on. Depression is worse, interested in maybe increasing her medications. Takes her Zoloft every day with no side effects. Blood pressures been doing okay at home takes everyday with no side effects. Restless legs doing okay on Requip no side effects Urinary symptoms reasonably controlled with Detrol  Relevant past medical, surgical, family and social history reviewed and updated as indicated. Interim medical history since our last visit reviewed. Allergies and medications reviewed and updated.  Review of Systems  Constitutional: Negative.   HENT: Negative.   Eyes: Negative.   Respiratory: Negative.   Cardiovascular: Negative.   Gastrointestinal: Negative.   Endocrine: Negative.   Genitourinary: Negative.   Musculoskeletal: Negative.   Skin: Negative.   Allergic/Immunologic: Negative.   Neurological: Negative.   Hematological: Negative.   Psychiatric/Behavioral: Negative.     Per HPI unless specifically indicated above     Objective:    BP 137/82 mmHg  Pulse 63  Temp(Src) 99 F (37.2 C)  Ht 5' 0.2" (1.529 m)  Wt 176 lb (79.833 kg)  BMI 34.15 kg/m2  SpO2 96%  Wt Readings from Last 3 Encounters:  04/20/15 176 lb (79.833 kg)  03/14/15 173 lb (78.472 kg)  02/27/15 173 lb (78.472 kg)    Physical Exam  Constitutional: She is oriented to person, place, and time. She appears well-developed and well-nourished.  HENT:  Head: Normocephalic and atraumatic.  Right Ear: External ear normal.  Left Ear: External ear normal.  Nose: Nose normal.  Mouth/Throat: Oropharynx is clear and moist.  Eyes: Conjunctivae and EOM are  normal. Pupils are equal, round, and reactive to light.  Neck: Normal range of motion. Neck supple. Carotid bruit is not present.  Cardiovascular: Normal rate, regular rhythm and normal heart sounds.   No murmur heard. Pulmonary/Chest: Effort normal and breath sounds normal. She exhibits no mass. Right breast exhibits no mass, no skin change and no tenderness. Left breast exhibits no mass, no skin change and no tenderness. Breasts are symmetrical.  Abdominal: Soft. Bowel sounds are normal. There is no hepatosplenomegaly.  Musculoskeletal: Normal range of motion.  Neurological: She is alert and oriented to person, place, and time.  Skin: No rash noted.  Psychiatric: She has a normal mood and affect. Her behavior is normal. Judgment and thought content normal.    Results for orders placed or performed during the hospital encounter of 39/03/00  Basic metabolic panel  Result Value Ref Range   Sodium 139 135 - 145 mmol/L   Potassium 3.2 (L) 3.5 - 5.1 mmol/L   Chloride 103 101 - 111 mmol/L   CO2 26 22 - 32 mmol/L   Glucose, Bld 131 (H) 65 - 99 mg/dL   BUN 20 6 - 20 mg/dL   Creatinine, Ser 0.71 0.44 - 1.00 mg/dL   Calcium 9.9 8.9 - 10.3 mg/dL   GFR calc non Af Amer >60 >60 mL/min   GFR calc Af Amer >60 >60 mL/min   Anion gap 10 5 - 15  CBC with Differential  Result Value Ref  Range   WBC 5.9 3.6 - 11.0 K/uL   RBC 4.34 3.80 - 5.20 MIL/uL   Hemoglobin 13.3 12.0 - 16.0 g/dL   HCT 39.3 35.0 - 47.0 %   MCV 90.6 80.0 - 100.0 fL   MCH 30.6 26.0 - 34.0 pg   MCHC 33.7 32.0 - 36.0 g/dL   RDW 13.7 11.5 - 14.5 %   Platelets 148 (L) 150 - 440 K/uL   Neutrophils Relative % 73 %   Neutro Abs 4.3 1.4 - 6.5 K/uL   Lymphocytes Relative 17 %   Lymphs Abs 1.0 1.0 - 3.6 K/uL   Monocytes Relative 6 %   Monocytes Absolute 0.3 0.2 - 0.9 K/uL   Eosinophils Relative 4 %   Eosinophils Absolute 0.2 0 - 0.7 K/uL   Basophils Relative 0 %   Basophils Absolute 0.0 0 - 0.1 K/uL      Assessment & Plan:    Problem List Items Addressed This Visit      Cardiovascular and Mediastinum   Hypertension    The current medical regimen is effective;  continue present plan and medications.       Relevant Medications   benazepril (LOTENSIN) 40 MG tablet   hydrochlorothiazide (HYDRODIURIL) 25 MG tablet   metoprolol succinate (TOPROL-XL) 50 MG 24 hr tablet   Other Relevant Orders   Comprehensive metabolic panel   Lipid panel   TSH   Urinalysis, Routine w reflex microscopic (not at [facility])     Other   Depression    Discuss worsening symptoms will increase Zoloft to 150 mg a day      Relevant Medications   sertraline (ZOLOFT) 100 MG tablet   Other Relevant Orders   Comprehensive metabolic panel   Lipid panel   TSH   Urinalysis, Routine w reflex microscopic (not at [facility])   Restless legs    Stable for now      Relevant Medications   naproxen (NAPROSYN) 500 MG tablet   rOPINIRole (REQUIP) 3 MG tablet   sertraline (ZOLOFT) 100 MG tablet   Other Relevant Orders   Comprehensive metabolic panel   Lipid panel   TSH   Urinalysis, Routine w reflex microscopic (not at [facility])    Other Visit Diagnoses    Immunization due    -  Primary    Relevant Orders    Flu Vaccine QUAD 36+ mos PF IM (Fluarix & Fluzone Quad PF) (Completed)    PE (physical exam), annual            Follow up plan: Return in about 6 months (around 10/19/2015), or if symptoms worsen or fail to improve, for BMP .

## 2015-04-20 NOTE — Assessment & Plan Note (Signed)
The current medical regimen is effective;  continue present plan and medications.  

## 2015-04-25 DIAGNOSIS — M5416 Radiculopathy, lumbar region: Secondary | ICD-10-CM | POA: Diagnosis not present

## 2015-04-25 DIAGNOSIS — M4806 Spinal stenosis, lumbar region: Secondary | ICD-10-CM | POA: Diagnosis not present

## 2015-04-26 ENCOUNTER — Inpatient Hospital Stay: Payer: Medicare Other | Attending: Family Medicine

## 2015-05-04 DIAGNOSIS — Z1231 Encounter for screening mammogram for malignant neoplasm of breast: Secondary | ICD-10-CM | POA: Diagnosis not present

## 2015-05-24 ENCOUNTER — Ambulatory Visit: Payer: Medicare Other | Admitting: Family Medicine

## 2015-07-14 DIAGNOSIS — M545 Low back pain: Secondary | ICD-10-CM | POA: Diagnosis not present

## 2015-07-14 DIAGNOSIS — Z6833 Body mass index (BMI) 33.0-33.9, adult: Secondary | ICD-10-CM | POA: Diagnosis not present

## 2015-07-14 DIAGNOSIS — M4806 Spinal stenosis, lumbar region: Secondary | ICD-10-CM | POA: Diagnosis not present

## 2015-07-14 DIAGNOSIS — M5416 Radiculopathy, lumbar region: Secondary | ICD-10-CM | POA: Diagnosis not present

## 2015-07-20 ENCOUNTER — Encounter: Payer: Self-pay | Admitting: Family Medicine

## 2015-07-20 ENCOUNTER — Ambulatory Visit (INDEPENDENT_AMBULATORY_CARE_PROVIDER_SITE_OTHER): Payer: Medicare Other | Admitting: Family Medicine

## 2015-07-20 VITALS — BP 105/71 | HR 63 | Temp 98.5°F | Wt 177.0 lb

## 2015-07-20 DIAGNOSIS — N3001 Acute cystitis with hematuria: Secondary | ICD-10-CM | POA: Diagnosis not present

## 2015-07-20 DIAGNOSIS — R300 Dysuria: Secondary | ICD-10-CM | POA: Diagnosis not present

## 2015-07-20 MED ORDER — CIPROFLOXACIN HCL 500 MG PO TABS: 500.0000 mg | ORAL_TABLET | Freq: Two times a day (BID) | ORAL | Status: DC

## 2015-07-20 NOTE — Patient Instructions (Signed)
Insomnia Insomnia is a sleep disorder that makes it difficult to fall asleep or to stay asleep. Insomnia can cause tiredness (fatigue), low energy, difficulty concentrating, mood swings, and poor performance at work or school.  There are three different ways to classify insomnia:  Difficulty falling asleep.  Difficulty staying asleep.  Waking up too early in the morning. Any type of insomnia can be long-term (chronic) or short-term (acute). Both are common. Short-term insomnia usually lasts for three months or less. Chronic insomnia occurs at least three times a week for longer than three months. CAUSES  Insomnia may be caused by another condition, situation, or substance, such as:  Anxiety.  Certain medicines.  Gastroesophageal reflux disease (GERD) or other gastrointestinal conditions.  Asthma or other breathing conditions.  Restless legs syndrome, sleep apnea, or other sleep disorders.  Chronic pain.  Menopause. This may include hot flashes.  Stroke.  Abuse of alcohol, tobacco, or illegal drugs.  Depression.  Caffeine.   Neurological disorders, such as Alzheimer disease.  An overactive thyroid (hyperthyroidism). The cause of insomnia may not be known. RISK FACTORS Risk factors for insomnia include:  Gender. Women are more commonly affected than men.  Age. Insomnia is more common as you get older.  Stress. This may involve your professional or personal life.  Income. Insomnia is more common in people with lower income.  Lack of exercise.   Irregular work schedule or night shifts.  Traveling between different time zones. SIGNS AND SYMPTOMS If you have insomnia, trouble falling asleep or trouble staying asleep is the main symptom. This may lead to other symptoms, such as:  Feeling fatigued.  Feeling nervous about going to sleep.  Not feeling rested in the morning.  Having trouble concentrating.  Feeling irritable, anxious, or depressed. TREATMENT   Treatment for insomnia depends on the cause. If your insomnia is caused by an underlying condition, treatment will focus on addressing the condition. Treatment may also include:   Medicines to help you sleep.  Counseling or therapy.  Lifestyle adjustments. HOME CARE INSTRUCTIONS   Take medicines only as directed by your health care provider.  Keep regular sleeping and waking hours. Avoid naps.  Keep a sleep diary to help you and your health care provider figure out what could be causing your insomnia. Include:   When you sleep.  When you wake up during the night.  How well you sleep.   How rested you feel the next day.  Any side effects of medicines you are taking.  What you eat and drink.   Make your bedroom a comfortable place where it is easy to fall asleep:  Put up shades or special blackout curtains to block light from outside.  Use a white noise machine to block noise.  Keep the temperature cool.   Exercise regularly as directed by your health care provider. Avoid exercising right before bedtime.  Use relaxation techniques to manage stress. Ask your health care provider to suggest some techniques that may work well for you. These may include:  Breathing exercises.  Routines to release muscle tension.  Visualizing peaceful scenes.  Cut back on alcohol, caffeinated beverages, and cigarettes, especially close to bedtime. These can disrupt your sleep.  Do not overeat or eat spicy foods right before bedtime. This can lead to digestive discomfort that can make it hard for you to sleep.  Limit screen use before bedtime. This includes:  Watching TV.  Using your smartphone, tablet, and computer.  Stick to a routine. This   can help you fall asleep faster. Try to do a quiet activity, brush your teeth, and go to bed at the same time each night.  Get out of bed if you are still awake after 15 minutes of trying to sleep. Keep the lights down, but try reading or  doing a quiet activity. When you feel sleepy, go back to bed.  Make sure that you drive carefully. Avoid driving if you feel very sleepy.  Keep all follow-up appointments as directed by your health care provider. This is important. SEEK MEDICAL CARE IF:   You are tired throughout the day or have trouble in your daily routine due to sleepiness.  You continue to have sleep problems or your sleep problems get worse. SEEK IMMEDIATE MEDICAL CARE IF:   You have serious thoughts about hurting yourself or someone else.   This information is not intended to replace advice given to you by your health care provider. Make sure you discuss any questions you have with your health care provider.   Document Released: 06/21/2000 Document Revised: 03/15/2015 Document Reviewed: 03/25/2014 Elsevier Interactive Patient Education 2016 [employer]  

## 2015-07-20 NOTE — Addendum Note (Signed)
Addended by: Valerie Roys on: 07/20/2015 01:59 PM   Modules accepted: Miquel Dunn

## 2015-07-20 NOTE — Progress Notes (Signed)
BP 105/71 mmHg  Pulse 63  Temp(Src) 98.5 F (36.9 C)  Wt 177 lb (80.287 kg)  SpO2 95%   Subjective:    Patient ID: Ann Silva, female    DOB: 05/24/1946, 70 y.o.   MRN: [id number]  HPI: Ann Silva is a 70 y.o. female  Chief Complaint  Patient presents with  . Urinary Tract Infection   URINARY SYMPTOMS Duration: about a week Dysuria: yes Urinary frequency: yes Urgency: yes Small volume voids: yes Symptom severity: moderate Urinary incontinence: yes Foul odor: yes Hematuria: yes Abdominal pain: no Back pain: no Suprapubic pain/pressure: yes Flank pain: no Fever:  no Vomiting: no Relief with cranberry juice: no Relief with pyridium: no Status: worse Previous urinary tract infection: yes Recurrent urinary tract infection: no Sexual activity: No sexually active/monogomous/practicing safe sex History of sexually transmitted disease: no Vaginal discharge: no Treatments attempted: none    Relevant past medical, surgical, family and social history reviewed and updated as indicated. Interim medical history since our last visit reviewed. Allergies and medications reviewed and updated.  Review of Systems  Constitutional: Negative.   Respiratory: Negative.   Cardiovascular: Negative.   Gastrointestinal: Negative.   Genitourinary: Negative.   Psychiatric/Behavioral: Negative.     Per HPI unless specifically indicated above     Objective:    BP 105/71 mmHg  Pulse 63  Temp(Src) 98.5 F (36.9 C)  Wt 177 lb (80.287 kg)  SpO2 95%  Wt Readings from Last 3 Encounters:  07/20/15 177 lb (80.287 kg)  04/20/15 176 lb (79.833 kg)  03/14/15 173 lb (78.472 kg)    Physical Exam  Constitutional: She is oriented to person, place, and time. She appears well-developed and well-nourished. No distress.  HENT:  Head: Normocephalic and atraumatic.  Right Ear: Hearing normal.  Left Ear: Hearing normal.  Nose: Nose normal.  Eyes: Conjunctivae and lids are normal.  Right eye exhibits no discharge. Left eye exhibits no discharge. No scleral icterus.  Cardiovascular: Normal rate, regular rhythm, normal heart sounds and intact distal pulses.  Exam reveals no gallop and no friction rub.   No murmur heard. Pulmonary/Chest: Effort normal. No respiratory distress. She has no wheezes. She has no rales. She exhibits no tenderness.  Coarse breath sounds bilaterally  Abdominal: Soft. Bowel sounds are normal. She exhibits no distension and no mass. There is no tenderness. There is no rebound, no guarding and no CVA tenderness.  Musculoskeletal: Normal range of motion.  Neurological: She is alert and oriented to person, place, and time.  Skin: Skin is warm, dry and intact. No rash noted. No erythema. No pallor.  Psychiatric: She has a normal mood and affect. Her speech is normal and behavior is normal. Judgment and thought content normal. Cognition and memory are normal.  Nursing note and vitals reviewed.   Results for orders placed or performed in visit on 04/20/15  Microscopic Examination  Result Value Ref Range   WBC, UA 0-5 0 -  5 /hpf   RBC, UA 0-2 0 -  2 /hpf   Epithelial Cells (non renal) 0-10 0 - 10 /hpf   Renal Epithel, UA None seen None seen /hpf   Bacteria, UA Few None seen/Few  Urinalysis, Routine w reflex microscopic (not at [facility])  Result Value Ref Range   Specific Gravity, UA 1.025 1.005 - 1.030   pH, UA 5.0 5.0 - 7.5   Color, UA Yellow Yellow   Appearance Ur Clear Clear   Leukocytes, UA Negative Negative  Protein, UA Trace Negative/Trace   Glucose, UA Negative Negative   Ketones, UA Negative Negative   RBC, UA Trace (A) Negative   Bilirubin, UA Negative Negative   Urobilinogen, Ur 0.2 0.2 - 1.0 mg/dL   Nitrite, UA Negative Negative   Microscopic Examination See below:       Assessment & Plan:   Problem List Items Addressed This Visit    None    Visit Diagnoses    Acute cystitis with hematuria    -  Primary    +UA, will treat with  cipro x 7 days. Call if not getting better or getting worse. Await culture.     Dysuria        Will check UA to look for possible UTI.     Relevant Orders    UA/M w/rflx Culture, Routine        Follow up plan: Return if symptoms worsen or fail to improve.

## 2015-07-21 ENCOUNTER — Telehealth: Payer: Self-pay | Admitting: Family Medicine

## 2015-07-21 MED ORDER — CIPROFLOXACIN HCL 500 MG PO TABS: 500.0000 mg | ORAL_TABLET | Freq: Two times a day (BID) | ORAL | Status: DC

## 2015-07-21 NOTE — Telephone Encounter (Signed)
Rx sent to her pharmacy 

## 2015-07-21 NOTE — Telephone Encounter (Signed)
Pt would like cipro to go to [address] instead of [place]

## 2015-07-24 LAB — UA/M W/RFLX CULTURE, ROUTINE
Bilirubin, UA: NEGATIVE
GLUCOSE, UA: NEGATIVE
Ketones, UA: NEGATIVE
Nitrite, UA: NEGATIVE
PH UA: 5 (ref 5.0–7.5)
SPEC GRAV UA: 1.02 (ref 1.005–1.030)
Urobilinogen, Ur: 0.2 mg/dL (ref 0.2–1.0)

## 2015-07-24 LAB — URINE CULTURE, REFLEX

## 2015-07-24 LAB — MICROSCOPIC EXAMINATION: WBC, UA: >30 /hpf — AB (ref 0–?)

## 2015-08-14 ENCOUNTER — Ambulatory Visit (INDEPENDENT_AMBULATORY_CARE_PROVIDER_SITE_OTHER): Payer: Medicare Other | Admitting: Family Medicine

## 2015-08-14 ENCOUNTER — Encounter: Payer: Self-pay | Admitting: Family Medicine

## 2015-08-14 VITALS — BP 119/60 | HR 64 | Temp 98.3°F | Ht 60.2 in | Wt 174.0 lb

## 2015-08-14 DIAGNOSIS — J111 Influenza due to unidentified influenza virus with other respiratory manifestations: Secondary | ICD-10-CM

## 2015-08-14 DIAGNOSIS — J019 Acute sinusitis, unspecified: Secondary | ICD-10-CM | POA: Diagnosis not present

## 2015-08-14 LAB — PLEASE NOTE:

## 2015-08-14 LAB — INFLUENZA A AND B
INFLUENZA A AG, EIA: NEGATIVE
Influenza B Ag, EIA: NEGATIVE

## 2015-08-14 MED ORDER — AZITHROMYCIN 250 MG PO TABS: ORAL_TABLET | ORAL | Status: DC

## 2015-08-14 NOTE — Assessment & Plan Note (Signed)
Discussed sinusitis care and treatment use a Z-Pak and Tessalon Perles for cough Use of over-the-counter medications Tylenol Mucinex Mucinex D Nasal rinses

## 2015-08-14 NOTE — Progress Notes (Signed)
BP 119/60 mmHg  Pulse 64  Temp(Src) 98.3 F (36.8 C)  Ht 5' 0.2" (1.529 m)  Wt 174 lb (78.926 kg)  BMI 33.76 kg/m2  SpO2 89%   Subjective:    Patient ID: Ann Silva, female    DOB: 11/18/1945, 70 y.o.   MRN: [id number]  HPI: Ann Silva is a 70 y.o. female  Chief Complaint  Patient presents with  . URI    Patient with classic flu symptoms started 1-1/2 day ago with marked allover baking dry cough some congestion cold and chills. Seemed to start all at once and was sick. With marked systemic symptoms some nausea but no vomiting. Some nasal congestion has tried some Tylenol and over-the-counter medications with minimal relief Also with marked sinusitis symptoms Relevant past medical, surgical, family and social history reviewed and updated as indicated. Interim medical history since our last visit reviewed. Allergies and medications reviewed and updated.  Other than noted above Review of Systems  Constitutional: Positive for fever, chills, diaphoresis, activity change, appetite change and fatigue.  HENT: Positive for congestion, rhinorrhea, sinus pressure, sneezing and sore throat.   Eyes: Negative.   Respiratory: Positive for cough.   Cardiovascular: Negative.   Gastrointestinal: Negative.     Per HPI unless specifically indicated above     Objective:    BP 119/60 mmHg  Pulse 64  Temp(Src) 98.3 F (36.8 C)  Ht 5' 0.2" (1.529 m)  Wt 174 lb (78.926 kg)  BMI 33.76 kg/m2  SpO2 89%  Wt Readings from Last 3 Encounters:  08/14/15 174 lb (78.926 kg)  07/20/15 177 lb (80.287 kg)  04/20/15 176 lb (79.833 kg)    Physical Exam  Constitutional: She is oriented to person, place, and time. She appears well-developed and well-nourished. No distress.  HENT:  Head: Normocephalic and atraumatic.  Right Ear: Hearing normal.  Left Ear: Hearing normal.  Nose: Nose normal.  Eyes: Conjunctivae and lids are normal. Right eye exhibits no discharge. Left eye exhibits no  discharge. No scleral icterus.  Cardiovascular: Normal rate, regular rhythm and normal heart sounds.   Pulmonary/Chest: No respiratory distress. She has rales.  Musculoskeletal: Normal range of motion.  Neurological: She is alert and oriented to person, place, and time.  Skin: Skin is intact. No rash noted.  Psychiatric: She has a normal mood and affect. Her speech is normal and behavior is normal. Judgment and thought content normal. Cognition and memory are normal.    Results for orders placed or performed in visit on 07/20/15  Microscopic Examination  Result Value Ref Range   WBC, UA >30 (A) 0 -  5 /hpf   RBC, UA 11-30 (A) 0 -  2 /hpf   Epithelial Cells (non renal) 0-10 0 - 10 /hpf   Bacteria, UA Few None seen/Few  UA/M w/rflx Culture, Routine  Result Value Ref Range   Specific Gravity, UA 1.020 1.005 - 1.030   pH, UA 5.0 5.0 - 7.5   Color, UA Yellow Yellow   Appearance Ur Cloudy (A) Clear   Leukocytes, UA 3+ (A) Negative   Protein, UA 1+ (A) Negative/Trace   Glucose, UA Negative Negative   Ketones, UA Negative Negative   RBC, UA 3+ (A) Negative   Bilirubin, UA Negative Negative   Urobilinogen, Ur 0.2 0.2 - 1.0 mg/dL   Nitrite, UA Negative Negative   Microscopic Examination See below:    Urinalysis Reflex Comment   Urine Culture, Routine  Result Value Ref Range  Urine Culture, Routine Final report (A)    Urine Culture result 1 Escherichia coli (A)    ANTIMICROBIAL SUSCEPTIBILITY Comment       Assessment & Plan:   Problem List Items Addressed This Visit      Respiratory   Sinusitis, acute    Discussed sinusitis care and treatment use a Z-Pak and Tessalon Perles for cough Use of over-the-counter medications Tylenol Mucinex Mucinex D Nasal rinses      Relevant Medications   azithromycin (ZITHROMAX) 250 MG tablet    Other Visit Diagnoses    Flu    -  Primary    Relevant Medications    azithromycin (ZITHROMAX) 250 MG tablet    Other Relevant Orders     Influenza a and b        Follow up plan: No Follow-up on file.

## 2015-08-17 ENCOUNTER — Ambulatory Visit (INDEPENDENT_AMBULATORY_CARE_PROVIDER_SITE_OTHER): Payer: Medicare Other | Admitting: Family Medicine

## 2015-08-17 ENCOUNTER — Encounter: Payer: Self-pay | Admitting: Family Medicine

## 2015-08-17 VITALS — BP 129/86 | HR 71 | Temp 97.9°F | Wt 177.0 lb

## 2015-08-17 DIAGNOSIS — R0902 Hypoxemia: Secondary | ICD-10-CM | POA: Diagnosis not present

## 2015-08-17 DIAGNOSIS — J441 Chronic obstructive pulmonary disease with (acute) exacerbation: Secondary | ICD-10-CM | POA: Diagnosis not present

## 2015-08-17 MED ORDER — DOXYCYCLINE MONOHYDRATE 100 MG PO TABS: 100.0000 mg | ORAL_TABLET | Freq: Two times a day (BID) | ORAL | Status: DC

## 2015-08-17 MED ORDER — ALBUTEROL SULFATE (2.5 MG/3ML) 0.083% IN NEBU: 2.5000 mg | INHALATION_SOLUTION | Freq: Once | RESPIRATORY_TRACT | Status: DC

## 2015-08-17 MED ORDER — ONDANSETRON 4 MG PO TBDP: 4.0000 mg | ORAL_TABLET | Freq: Three times a day (TID) | ORAL | Status: DC | PRN

## 2015-08-17 MED ORDER — ALBUTEROL SULFATE HFA 108 (90 BASE) MCG/ACT IN AERS: 2.0000 | INHALATION_SPRAY | RESPIRATORY_TRACT | Status: DC | PRN

## 2015-08-17 NOTE — Progress Notes (Signed)
BP 129/86 mmHg  Pulse 71  Temp(Src) 97.9 F (36.6 C)  Wt 177 lb (80.287 kg)  SpO2 99%   Subjective:    Patient ID: Ann Silva, female    DOB: 1945/11/18, 70 y.o.   MRN: [id number]  HPI: Ann Silva is a 70 y.o. female  Chief Complaint  Patient presents with  . Cough   UPPER RESPIRATORY TRACT INFECTION Duration: almost a week Worst symptom: cough Fever: no Cough: yes Shortness of breath: yes Wheezing: yes Chest pain: yes Chest tightness: yes Chest congestion: yes Nasal congestion: yes Runny nose: yes Post nasal drip: yes Sneezing: yes Sore throat: yes Swollen glands: yes Sinus pressure: yes Headache: yes Face pain: yes Toothache: yes Ear pain: yes  Ear pressure: yes  Eyes red/itching:no Eye drainage/crusting: no  Vomiting: yes Rash: no Fatigue: yes Sick contacts: yes Strep contacts: no  Context: better Recurrent sinusitis: no Relief with OTC cold/cough medications: no  Treatments attempted: cough syrup and antibiotics   Relevant past medical, surgical, family and social history reviewed and updated as indicated. Interim medical history since our last visit reviewed. Allergies and medications reviewed and updated.  Review of Systems  Constitutional: Negative.   HENT: Negative.   Respiratory: Negative.   Cardiovascular: Negative.   Psychiatric/Behavioral: Negative.     Per HPI unless specifically indicated above     Objective:    BP 129/86 mmHg  Pulse 71  Temp(Src) 97.9 F (36.6 C)  Wt 177 lb (80.287 kg)  SpO2 99%  Wt Readings from Last 3 Encounters:  08/17/15 177 lb (80.287 kg)  08/14/15 174 lb (78.926 kg)  07/20/15 177 lb (80.287 kg)    Physical Exam  Constitutional: She is oriented to person, place, and time. She appears well-developed and well-nourished. No distress.  HENT:  Head: Normocephalic and atraumatic.  Right Ear: Hearing and external ear normal.  Left Ear: Hearing and external ear normal.  Nose: Nose normal.   Mouth/Throat: Oropharynx is clear and moist. No oropharyngeal exudate.  Eyes: Conjunctivae, EOM and lids are normal. Pupils are equal, round, and reactive to light. Right eye exhibits no discharge. Left eye exhibits no discharge. No scleral icterus.  Neck: Normal range of motion. Neck supple. No JVD present. No tracheal deviation present. No thyromegaly present.  Cardiovascular: Normal rate, regular rhythm, normal heart sounds and intact distal pulses.  Exam reveals no gallop and no friction rub.   No murmur heard. Pulmonary/Chest: Effort normal. No stridor. No respiratory distress. She has no decreased breath sounds. She has wheezes in the right upper field, the right middle field, the right lower field, the left upper field, the left middle field and the left lower field. She has rhonchi in the right upper field, the right middle field, the right lower field, the left upper field, the left middle field and the left lower field. She has no rales. She exhibits no tenderness.  Musculoskeletal: Normal range of motion.  Lymphadenopathy:    She has no cervical adenopathy.  Neurological: She is alert and oriented to person, place, and time.  Skin: Skin is intact. No rash noted. She is not diaphoretic.  Psychiatric: She has a normal mood and affect. Her speech is normal and behavior is normal. Judgment and thought content normal. Cognition and memory are normal.  Nursing note and vitals reviewed.   Results for orders placed or performed in visit on 08/14/15  Influenza a and b  Result Value Ref Range   Influenza A Ag, EIA  Negative Negative   Influenza B Ag, EIA Negative Negative   Influenza Comment See note   Please note:  Result Value Ref Range   Please note: Comment       Assessment & Plan:   Problem List Items Addressed This Visit    None    Visit Diagnoses    COPD exacerbation ([place])    -  Primary    Will treat with doxy and albuterol inhaler. Will recheck lungs in 1 week. Needs spiro  next visit. Continue to monitor closely.    Relevant Medications    albuterol (PROVENTIL) (2.5 MG/3ML) 0.083% nebulizer solution 2.5 mg    albuterol (PROVENTIL HFA;VENTOLIN HFA) 108 (90 Base) MCG/ACT inhaler    Hypoxia        Improved after neb.     Relevant Medications    albuterol (PROVENTIL) (2.5 MG/3ML) 0.083% nebulizer solution 2.5 mg        Follow up plan: Return in about 1 week (around 08/24/2015) for Lung check.

## 2015-08-21 ENCOUNTER — Inpatient Hospital Stay
Admission: EM | Admit: 2015-08-21 | Discharge: 2015-08-23 | DRG: 192 | Disposition: A | Discharge status: Home / Self Care | Payer: Medicare Other | Attending: Specialist | Admitting: Specialist

## 2015-08-21 ENCOUNTER — Emergency Department: Payer: Medicare Other

## 2015-08-21 ENCOUNTER — Encounter: Payer: Self-pay | Admitting: Emergency Medicine

## 2015-08-21 ENCOUNTER — Ambulatory Visit: Payer: Medicare Other | Admitting: Family Medicine

## 2015-08-21 DIAGNOSIS — F419 Anxiety disorder, unspecified: Secondary | ICD-10-CM | POA: Diagnosis present

## 2015-08-21 DIAGNOSIS — I10 Essential (primary) hypertension: Secondary | ICD-10-CM | POA: Diagnosis not present

## 2015-08-21 DIAGNOSIS — Z96649 Presence of unspecified artificial hip joint: Secondary | ICD-10-CM | POA: Diagnosis present

## 2015-08-21 DIAGNOSIS — Z85038 Personal history of other malignant neoplasm of large intestine: Secondary | ICD-10-CM | POA: Diagnosis not present

## 2015-08-21 DIAGNOSIS — D649 Anemia, unspecified: Secondary | ICD-10-CM | POA: Diagnosis present

## 2015-08-21 DIAGNOSIS — G2581 Restless legs syndrome: Secondary | ICD-10-CM | POA: Diagnosis present

## 2015-08-21 DIAGNOSIS — Z8673 Personal history of transient ischemic attack (TIA), and cerebral infarction without residual deficits: Secondary | ICD-10-CM | POA: Diagnosis not present

## 2015-08-21 DIAGNOSIS — Z809 Family history of malignant neoplasm, unspecified: Secondary | ICD-10-CM

## 2015-08-21 DIAGNOSIS — Z818 Family history of other mental and behavioral disorders: Secondary | ICD-10-CM | POA: Diagnosis not present

## 2015-08-21 DIAGNOSIS — F17210 Nicotine dependence, cigarettes, uncomplicated: Secondary | ICD-10-CM | POA: Diagnosis present

## 2015-08-21 DIAGNOSIS — Z79899 Other long term (current) drug therapy: Secondary | ICD-10-CM

## 2015-08-21 DIAGNOSIS — R05 Cough: Secondary | ICD-10-CM | POA: Diagnosis not present

## 2015-08-21 DIAGNOSIS — R0902 Hypoxemia: Secondary | ICD-10-CM | POA: Diagnosis present

## 2015-08-21 DIAGNOSIS — J208 Acute bronchitis due to other specified organisms: Secondary | ICD-10-CM | POA: Diagnosis present

## 2015-08-21 DIAGNOSIS — F32A Depression, unspecified: Secondary | ICD-10-CM | POA: Diagnosis present

## 2015-08-21 DIAGNOSIS — G4730 Sleep apnea, unspecified: Secondary | ICD-10-CM | POA: Diagnosis present

## 2015-08-21 DIAGNOSIS — J441 Chronic obstructive pulmonary disease with (acute) exacerbation: Secondary | ICD-10-CM | POA: Diagnosis present

## 2015-08-21 DIAGNOSIS — F3289 Other specified depressive episodes: Secondary | ICD-10-CM | POA: Diagnosis not present

## 2015-08-21 DIAGNOSIS — F329 Major depressive disorder, single episode, unspecified: Secondary | ICD-10-CM | POA: Diagnosis present

## 2015-08-21 DIAGNOSIS — Z833 Family history of diabetes mellitus: Secondary | ICD-10-CM

## 2015-08-21 DIAGNOSIS — R0602 Shortness of breath: Secondary | ICD-10-CM | POA: Diagnosis not present

## 2015-08-21 DIAGNOSIS — Z823 Family history of stroke: Secondary | ICD-10-CM

## 2015-08-21 DIAGNOSIS — J440 Chronic obstructive pulmonary disease with acute lower respiratory infection: Principal | ICD-10-CM | POA: Diagnosis present

## 2015-08-21 HISTORY — DX: Chronic obstructive pulmonary disease, unspecified: J44.9

## 2015-08-21 HISTORY — DX: Restless legs syndrome: G25.81

## 2015-08-21 HISTORY — DX: Anxiety disorder, unspecified: F41.9

## 2015-08-21 LAB — TROPONIN I

## 2015-08-21 LAB — COMPREHENSIVE METABOLIC PANEL
ALBUMIN: 3.8 g/dL (ref 3.5–5.0)
ALK PHOS: 71 U/L (ref 38–126)
ALT: 16 U/L (ref 14–54)
AST: 24 U/L (ref 15–41)
Anion gap: 4 — ABNORMAL LOW (ref 5–15)
BILIRUBIN TOTAL: 1 mg/dL (ref 0.3–1.2)
BUN: 18 mg/dL (ref 6–20)
CO2: 34 mmol/L — ABNORMAL HIGH (ref 22–32)
CREATININE: 0.72 mg/dL (ref 0.44–1.00)
Calcium: 10.3 mg/dL (ref 8.9–10.3)
Chloride: 103 mmol/L (ref 101–111)
GFR calc Af Amer: >60 mL/min (ref >60)
GFR calc non Af Amer: >60 mL/min (ref >60)
GLUCOSE: 116 mg/dL — AB (ref 65–99)
POTASSIUM: 3.4 mmol/L — AB (ref 3.5–5.1)
Sodium: 141 mmol/L (ref 135–145)
TOTAL PROTEIN: 7.2 g/dL (ref 6.5–8.1)

## 2015-08-21 LAB — CBC
HEMATOCRIT: 42 % (ref 35.0–47.0)
HEMOGLOBIN: 14.4 g/dL (ref 12.0–16.0)
MCH: 30.9 pg (ref 26.0–34.0)
MCHC: 34.2 g/dL (ref 32.0–36.0)
MCV: 90.3 fL (ref 80.0–100.0)
Platelets: 226 10*3/uL (ref 150–440)
RBC: 4.65 MIL/uL (ref 3.80–5.20)
RDW: 14.1 % (ref 11.5–14.5)
WBC: 6.7 10*3/uL (ref 3.6–11.0)

## 2015-08-21 MED ORDER — IPRATROPIUM-ALBUTEROL 0.5-2.5 (3) MG/3ML IN SOLN
3.0000 mL | Freq: Once | RESPIRATORY_TRACT | Status: AC
  Administered 2015-08-21: 3 mL via RESPIRATORY_TRACT
  Filled 2015-08-21: qty 3

## 2015-08-21 MED ORDER — METHYLPREDNISOLONE SODIUM SUCC 125 MG IJ SOLR
125.0000 mg | INTRAMUSCULAR | Status: AC
  Administered 2015-08-21: 125 mg via INTRAVENOUS
  Filled 2015-08-21: qty 2

## 2015-08-21 NOTE — H&P (Signed)
[facility] NAME: Ann Silva    MR#:  [id number]  DATE OF BIRTH:  26-Jun-1946  DATE OF ADMISSION:  08/21/2015  PRIMARY CARE PHYSICIAN: Golden Pop, MD   REQUESTING/REFERRING PHYSICIAN: Jacqualine Code, MD  CHIEF COMPLAINT:   Chief Complaint  Patient presents with  . Cough    HISTORY OF PRESENT ILLNESS:  Ann Silva  is a 70 y.o. female who presents with acute worsening of progressive shortness of breath. Patient states that she had a similar episode several months ago, but stated home and was able to "get through it with over-the-counter meds." She states that a week and a half ago she began having URI symptoms and increased cough. Since that time her shortness of breath became progressively worse, and over the last 2-3 days has gotten to the point that she felt like she needed to come in for evaluation. She had been to her outpatient doctor couple of times over this time course, and was initially put on azithromycin, and then had changed to doxycycline with no improvement. Here in the ED her labs and imaging workup is largely benign, though she does have some persistent wheezing on exam even after IV Solu-Medrol and nebulizer treatments. She also has a mildly persistent hypoxia which is corrected well with O2 via nasal cannula. She has a long-standing smoking history despite no formal diagnosis of COPD. Hospitals were called for what seems to be COPD exacerbation.  PAST MEDICAL HISTORY:   Past Medical History  Diagnosis Date  . 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 MSSA (methicillin susceptible Staphylococcus aureus) infection   . 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 Colon cancer ([place])   . Anemia   . Depression   . Hypertension   . Sleep apnea   . Atopic neurodermatitis   . Anxiety   . TIA (transient ischemic attack)   . COPD (chronic obstructive pulmonary disease) ([place])   . RLS (restless legs  syndrome)     PAST SURGICAL HISTORY:   Past Surgical History  Procedure Laterality Date  . Total hip arthroplasty    . Cholecystectomy    . Colon surgery    . Abdominal hysterectomy    . Revision of total hip arthroplasty    . Resection total hip    . Joint replacement      SOCIAL HISTORY:   Social History  Substance Use Topics  . Smoking status: Current Every Day Smoker -- 0.50 packs/day    Types: Cigarettes    Start date: 07/08/1964  . Smokeless tobacco: Never Used  . Alcohol Use: No    FAMILY HISTORY:   Family History  Problem Relation Age of Onset  . Diabetes Sister   . Mental illness Sister   . Cancer Mother   . Stroke Father     DRUG ALLERGIES:   Allergies  Allergen Reactions  . Hydrocodone-Chlorpheniramine Shortness Of Breath  . Prednisone Shortness Of Breath  . Chlorhexidine Rash  . Methadone Other (See Comments)    Reaction: Altered Mental Status  . Sulfa Antibiotics Other (See Comments)    Reaction: unknown  . Benadryl [Diphenhydramine] Anxiety  . Codeine Anxiety  . Diphenhydramine-Zinc Acetate Anxiety    MEDICATIONS AT HOME:   Prior to Admission medications   Medication Sig Start Date End Date Taking? Authorizing Provider  benazepril (LOTENSIN) 40 MG tablet Take 1 tablet (40 mg total) by mouth daily. 04/20/15  Yes [name], MD  calcium carbonate (OS-CAL - DOSED IN MG OF ELEMENTAL CALCIUM) 1250 (500 Ca) MG tablet Take 1 tablet by mouth daily with breakfast.   Yes Historical Provider, MD  doxycycline (ADOXA) 100 MG tablet Take 1 tablet (100 mg total) by mouth 2 (two) times daily. 08/17/15  Yes Megan P Johnson, DO  ferrous sulfate 325 (65 FE) MG tablet Take 325 mg by mouth 3 (three) times daily with meals.   Yes Historical Provider, MD  fluticasone (FLONASE) 50 MCG/ACT nasal spray Place 1 spray into both nostrils daily as needed for rhinitis.    Yes Historical Provider, MD  hydrochlorothiazide (HYDRODIURIL) 25 MG tablet Take 1 tablet (25 mg  total) by mouth daily. Patient taking differently: Take 25 mg by mouth at bedtime.  04/20/15  Yes [name], MD  metoprolol succinate (TOPROL-XL) 50 MG 24 hr tablet Take 1 tablet (50 mg total) by mouth daily. 04/20/15  Yes [name], MD  naproxen (NAPROSYN) 500 MG tablet Take 1 tablet (500 mg total) by mouth 2 (two) times daily with a meal. 04/20/15  Yes [name], MD  ondansetron (ZOFRAN ODT) 4 MG disintegrating tablet Take 1 tablet (4 mg total) by mouth every 8 (eight) hours as needed for nausea or vomiting. 08/17/15  Yes Megan P Johnson, DO  rOPINIRole (REQUIP) 3 MG tablet Take 1 tablet (3 mg total) by mouth 4 (four) times daily. 04/20/15  Yes [name], MD  sertraline (ZOLOFT) 100 MG tablet Take 1.5 tablets (150 mg total) by mouth daily. Patient taking differently: Take 100 mg by mouth at bedtime.  04/20/15  Yes [name], MD    REVIEW OF SYSTEMS:  Review of Systems  Constitutional: Positive for malaise/fatigue. Negative for fever, chills and weight loss.  HENT: Negative for ear pain, hearing loss and tinnitus.   Eyes: Negative for blurred vision, double vision, pain and redness.  Respiratory: Positive for cough, sputum production, shortness of breath and wheezing. Negative for hemoptysis.   Cardiovascular: Negative for chest pain, palpitations, orthopnea and leg swelling.  Gastrointestinal: Negative for nausea, vomiting, abdominal pain, diarrhea and constipation.  Genitourinary: Negative for dysuria, frequency and hematuria.  Musculoskeletal: Negative for back pain, joint pain and neck pain.  Skin:       No acne, rash, or lesions  Neurological: Negative for dizziness, tremors, focal weakness and weakness.  Endo/Heme/Allergies: Negative for polydipsia. Does not bruise/bleed easily.  Psychiatric/Behavioral: Negative for depression. The patient is not nervous/anxious and does not have insomnia.      VITAL SIGNS:   Filed Vitals:   08/21/15 2030 08/21/15 2100  08/21/15 2200 08/21/15 2230  BP: 127/78 112/64 116/68 105/60  Pulse: 86 78 81 80  Temp:      TempSrc:      Resp: 12 20 15 20   Height:      Weight:      SpO2: 95% 94% 95% 91%   Wt Readings from Last 3 Encounters:  08/21/15 80.287 kg (177 lb)  08/17/15 80.287 kg (177 lb)  08/14/15 78.926 kg (174 lb)    PHYSICAL EXAMINATION:  Physical Exam  Vitals reviewed. Constitutional: She is oriented to person, place, and time. She appears well-developed and well-nourished. No distress.  HENT:  Head: Normocephalic and atraumatic.  Mouth/Throat: Oropharynx is clear and moist.  Eyes: Conjunctivae and EOM are normal. Pupils are equal, round, and reactive to light. No scleral icterus.  Neck: Normal range of motion. Neck supple. No  JVD present. No thyromegaly present.  Cardiovascular: Normal rate, regular rhythm and intact distal pulses.  Exam reveals no gallop and no friction rub.   No murmur heard. Respiratory: She is in respiratory distress (mild). She has wheezes. She has no rales.  GI: Soft. Bowel sounds are normal. She exhibits no distension. There is no tenderness.  Musculoskeletal: Normal range of motion. She exhibits no edema.  No arthritis, no gout  Lymphadenopathy:    She has no cervical adenopathy.  Neurological: She is alert and oriented to person, place, and time. No cranial nerve deficit.  No dysarthria, no aphasia  Skin: Skin is warm and dry. No rash noted. No erythema.  Psychiatric: She has a normal mood and affect. Her behavior is normal. Judgment and thought content normal.    LABORATORY PANEL:   CBC  Recent Labs Lab 08/21/15 1924  WBC 6.7  HGB 14.4  HCT 42.0  PLT 226   ------------------------------------------------------------------------------------------------------------------  Chemistries   Recent Labs Lab 08/21/15 1924  NA 141  K 3.4*  CL 103  CO2 34*  GLUCOSE 116*  BUN 18  CREATININE 0.72  CALCIUM 10.3  AST 24  ALT 16  ALKPHOS 71  BILITOT  1.0   ------------------------------------------------------------------------------------------------------------------  Cardiac Enzymes  Recent Labs Lab 08/21/15 1924  TROPONINI <0.03   ------------------------------------------------------------------------------------------------------------------  RADIOLOGY:  Dg Chest 2 View  08/21/2015  CLINICAL DATA:  70 year old female with cough and congestion and shortness of breath EXAM: CHEST  2 VIEW COMPARISON:  Radiograph dated 07/09/2014 FINDINGS: Two views of the chest do not demonstrate focal consolidation. There is no pleural effusion or pneumothorax. Mild increased interstitial prominence appears chronic. The cardiac silhouette is within normal limits. No acute osseous pathology. IMPRESSION: No active cardiopulmonary disease. Electronically Signed   By: Anner Crete M.D.   On: 08/21/2015 19:33    EKG:   Orders placed or performed during the hospital encounter of 08/21/15  . ED EKG  . ED EKG    IMPRESSION AND PLAN:  Principal Problem:   COPD exacerbation ([place]) - despite a listed allergy to prednisone, patient does not know what her reaction was to it and states that whatever happened happened "a long time ago." She was given IV Solu-Medrol in the ED today, seemingly without any complication to this point. She was also given nebs, with some improvement in her respiratory status. We will continue IV steroids and nebulizers on admission, and will add Levaquin. She failed treatment with outpatient azithromycin and doxycycline, albeit these were not used in conjunction with steroids. Active Problems:   Hypertension - Stable, continue home meds    Depression - Continue home meds    Restless legs - continue home meds   All the records are reviewed and case discussed with ED provider. Management plans discussed with the patient and/or family.  DVT PROPHYLAXIS: SubQ lovenox  GI PROPHYLAXIS: None  ADMISSION STATUS:  Inpatient  CODE STATUS: Full Code Status History    This patient does not have a recorded code status. Please follow your organizational policy for patients in this situation.      TOTAL TIME TAKING CARE OF THIS PATIENT: 45 minutes.    Jaydin Jalomo Ballou 08/21/2015, 11:43 PM  [facility]  Office  [phone]  CC: Primary care physician; Golden Pop, MD

## 2015-08-21 NOTE — ED Notes (Signed)
Cough and congestion x 1 week 

## 2015-08-21 NOTE — ED Provider Notes (Signed)
[facility] Provider Note  ____________________________________________  Time seen: Approximately 9:45 PM  I have reviewed the triage vital signs and the nursing notes.   HISTORY  Chief Complaint Cough    HPI HEELA FRYDMAN is a 70 y.o. female history of anemia and depression hypertension. Patient reports over the last week she has been her doctor twice for cough and wheezing. Treat with an inhaler and azithromycin and Levaquin with no relief and she reports worsening with ongoing wheezing and shortness of breath that is worsening throughout the last 2 days.  She does continue to smoke. She denies a history of "COPD". No chest pain. Reports wheezing despite using inhalers.   Past Medical History  Diagnosis Date  . 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 MSSA (methicillin susceptible Staphylococcus aureus) infection   . 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 Colon cancer ([place])   . Anemia   . Depression   . Hypertension   . Sleep apnea   . Atopic neurodermatitis     Patient Active Problem List   Diagnosis Date Noted  . Sinusitis, acute 08/14/2015  . Restless legs 02/27/2015  . Hypertension   . Colon cancer ([place])   . Depression   . History of repair of hip joint 10/25/2013    Past Surgical History  Procedure Laterality Date  . Total hip arthroplasty    . Cholecystectomy    . Colon surgery    . Abdominal hysterectomy    . Revision of total hip arthroplasty    . Resection total hip    . Joint replacement      Current Outpatient Rx  Name  Route  Sig  Dispense  Refill  . albuterol (PROVENTIL HFA;VENTOLIN HFA) 108 (90 Base) MCG/ACT inhaler   Inhalation   Inhale 2 puffs into the lungs every 4 (four) hours as needed for wheezing or shortness of breath.   1 Inhaler   1   . azithromycin (ZITHROMAX) 250 MG tablet      2 now then 1 a day   6 tablet   0   . benazepril (LOTENSIN) 40 MG tablet    Oral   Take 1 tablet (40 mg total) by mouth daily.   90 tablet   4   . doxycycline (ADOXA) 100 MG tablet   Oral   Take 1 tablet (100 mg total) by mouth 2 (two) times daily.   20 tablet   0   . fluticasone (FLONASE) 50 MCG/ACT nasal spray   Each Nare   Place 1 spray into both nostrils daily.         . hydrochlorothiazide (HYDRODIURIL) 25 MG tablet   Oral   Take 1 tablet (25 mg total) by mouth daily.   90 tablet   4   . metoprolol succinate (TOPROL-XL) 50 MG 24 hr tablet   Oral   Take 1 tablet (50 mg total) by mouth daily.   90 tablet   4   . naproxen (NAPROSYN) 500 MG tablet   Oral   Take 1 tablet (500 mg total) by mouth 2 (two) times daily with a meal.   180 tablet   1   . ondansetron (ZOFRAN ODT) 4 MG disintegrating tablet   Oral   Take 1 tablet (4 mg total) by mouth every 8 (eight) hours as needed for nausea or vomiting.   20 tablet   0   . rOPINIRole (REQUIP)  3 MG tablet   Oral   Take 1 tablet (3 mg total) by mouth 4 (four) times daily.   360 tablet   4   . sertraline (ZOLOFT) 100 MG tablet   Oral   Take 1.5 tablets (150 mg total) by mouth daily.   135 tablet   4     Allergies Hydrocodone-chlorpheniramine; Chlorhexidine; Benadryl; Codeine; Methadone; Prednisone; Sulfa antibiotics; Tussionex pennkinetic er; and Diphenhydramine-zinc acetate  Family History  Problem Relation Age of Onset  . Diabetes Sister   . Mental illness Sister   . Cancer Mother   . Stroke Father     Social History Social History  Substance Use Topics  . Smoking status: Current Every Day Smoker -- 0.50 packs/day    Types: Cigarettes    Start date: 07/08/1964  . Smokeless tobacco: Never Used  . Alcohol Use: No    Review of Systems Constitutional: No fever/chills Eyes: No visual changes. ENT: No sore throat. Cardiovascular: Denies chest pain. Respiratory: See history of present illness Gastrointestinal: No abdominal pain.  No nausea, no vomiting.  No diarrhea.  No  constipation. Genitourinary: Negative for dysuria. Musculoskeletal: Negative for back pain. Skin: Negative for rash. Neurological: Negative for headaches, focal weakness or numbness. No myalgia  10-point ROS otherwise negative.  ____________________________________________   PHYSICAL EXAM:  VITAL SIGNS: ED Triage Vitals  Enc Vitals Group     BP 08/21/15 1837 107/47 mmHg     Pulse Rate 08/21/15 1837 66     Resp 08/21/15 1837 20     Temp 08/21/15 1837 98 F (36.7 C)     Temp Source 08/21/15 1837 Oral     SpO2 08/21/15 1837 9 %     Weight 08/21/15 1837 177 lb (80.287 kg)     Height 08/21/15 1837 5\' 1"  (1.549 m)     Head Cir --      Peak Flow --      Pain Score --      Pain Loc --      Pain Edu? --      Excl. in [place]? --    Constitutional: Alert and oriented. Moderately dyspneic with expiratory wheezing. Very pleasant. Eyes: Conjunctivae are normal. PERRL. EOMI. Head: Atraumatic. Nose: No congestion/rhinnorhea. Mouth/Throat: Mucous membranes are moist.  Oropharynx non-erythematous. Neck: No stridor.   Cardiovascular: Normal rate, regular rhythm. Grossly normal heart sounds.  Good peripheral circulation. Respiratory: Moderate increased work of breathing, some accessory use and end expiratory wheezing throughout. She does speak in phrases. Gastrointestinal: Soft and nontender. No distention. No abdominal bruits. Musculoskeletal: No lower extremity tenderness nor edema.  No joint effusions. Neurologic:  Normal speech and language. No gross focal neurologic deficits are appreciated. Skin:  Skin is warm, dry and intact. No rash noted. Psychiatric: Mood and affect are normal. Speech and behavior are normal.  ____________________________________________   LABS (all labs ordered are listed, but only abnormal results are displayed)  Labs Reviewed  COMPREHENSIVE METABOLIC PANEL - Abnormal; Notable for the following:    Potassium 3.4 (*)    CO2 34 (*)    Glucose, Bld 116 (*)     Anion gap 4 (*)    All other components within normal limits  CBC  TROPONIN I   ____________________________________________  EKG  ED ECG REPORT I, QUALE, MARK, the attending physician, personally viewed and interpreted this ECG.  Date: 08/21/2015 EKG Time: 1930 Rate: 70 Rhythm: normal sinus rhythm QRS Axis: normal Intervals: normal ST/T Wave abnormalities: normal Conduction Disturbances:  none Narrative Interpretation: unremarkable  ____________________________________________  RADIOLOGY     DG Chest 2 View (Final result) Result time: 08/21/15 19:33:05   Final result by Rad Results In Interface (08/21/15 19:33:05)   Narrative:   CLINICAL DATA: 70 year old female with cough and congestion and shortness of breath  EXAM: CHEST 2 VIEW  COMPARISON: Radiograph dated 07/09/2014  FINDINGS: Two views of the chest do not demonstrate focal consolidation. There is no pleural effusion or pneumothorax. Mild increased interstitial prominence appears chronic. The cardiac silhouette is within normal limits. No acute osseous pathology.  IMPRESSION: No active cardiopulmonary disease.   Electronically Signed By: Anner Crete M.D. On: 08/21/2015 19:33    ____________________________________________   PROCEDURES  Procedure(s) performed: None  Critical Care performed: No  ____________________________________________   INITIAL IMPRESSION / ASSESSMENT AND PLAN / ED COURSE  Pertinent labs & imaging results that were available during my care of the patient were reviewed by me and considered in my medical decision making (see chart for details).  Patient presents for persistent wheezing cough and worsening despite antibiotics and inhalers. She has moderate increased work of breathing with an expiratory wheezing. I'm highly suspicious for possible COPD exacerbation, no sinus symptoms of acute coronary syndrome. No pleuritic chest pain or major risk factor for  pulmonary embolism.  Patient does report that she do prednisone once for wheezing and thought it made her breathing worse, but she is not sure that this was a reaction. She denies any symptoms of anaphylaxis, she did not develop any hives or itching. As the patient has persistent wheezing over 3 duo nebs continues to feel moderate shortness of breath, and has a 2 L oxygen requirement I did discuss the risks and benefits of steroid treatment including use of Solu-Medrol. After discussing with the patient and her family and again is very reasonable since he benefited steroid use given COPD suspected exacerbation and/or reactive airway disease exacerbation. He is not showing much improvement after 3 nebulizers, we will treat her with Solu-Medrol. She does report feeling better but continues to wheeze throughout all lobes.  Discussed with the patient and she would like to be admitted, I think is also very reasonable given 3 duo nebs, persistent wheezing. We will monitor her closely as giving Solu-Medrol, though I do not believe the patient had a true drug allergy.  It seems to always a risk at this point to a monitored closely.  Admitted to [facility], impression COPD exacerbation. ____________________________________________   FINAL CLINICAL IMPRESSION(S) / ED DIAGNOSES  Final diagnoses:  COPD exacerbation ([place])      Delman Kitten, MD 08/21/15 2150

## 2015-08-22 LAB — CBC
HCT: 41.3 % (ref 35.0–47.0)
HEMOGLOBIN: 13.9 g/dL (ref 12.0–16.0)
MCH: 30.9 pg (ref 26.0–34.0)
MCHC: 33.7 g/dL (ref 32.0–36.0)
MCV: 91.7 fL (ref 80.0–100.0)
PLATELETS: 199 10*3/uL (ref 150–440)
RBC: 4.51 MIL/uL (ref 3.80–5.20)
RDW: 14.2 % (ref 11.5–14.5)
WBC: 7.4 10*3/uL (ref 3.6–11.0)

## 2015-08-22 LAB — BASIC METABOLIC PANEL
ANION GAP: 11 (ref 5–15)
BUN: 22 mg/dL — ABNORMAL HIGH (ref 6–20)
CHLORIDE: 99 mmol/L — AB (ref 101–111)
CO2: 24 mmol/L (ref 22–32)
CREATININE: 0.78 mg/dL (ref 0.44–1.00)
Calcium: 9.4 mg/dL (ref 8.9–10.3)
GFR calc non Af Amer: >60 mL/min (ref >60)
Glucose, Bld: 207 mg/dL — ABNORMAL HIGH (ref 65–99)
Potassium: 3.7 mmol/L (ref 3.5–5.1)
SODIUM: 134 mmol/L — AB (ref 135–145)

## 2015-08-22 MED ORDER — BENAZEPRIL HCL 20 MG PO TABS
40.0000 mg | ORAL_TABLET | Freq: Every day | ORAL | Status: DC
  Administered 2015-08-22 – 2015-08-23 (×2): 40 mg via ORAL
  Filled 2015-08-22 (×2): qty 2

## 2015-08-22 MED ORDER — ONDANSETRON HCL 4 MG/2ML IJ SOLN
4.0000 mg | Freq: Four times a day (QID) | INTRAMUSCULAR | Status: DC | PRN
  Administered 2015-08-22: 4 mg via INTRAVENOUS
  Filled 2015-08-22: qty 2

## 2015-08-22 MED ORDER — BUDESONIDE 0.25 MG/2ML IN SUSP
0.2500 mg | Freq: Two times a day (BID) | RESPIRATORY_TRACT | Status: DC
  Administered 2015-08-22 – 2015-08-23 (×2): 0.25 mg via RESPIRATORY_TRACT
  Filled 2015-08-22 (×2): qty 2

## 2015-08-22 MED ORDER — METOPROLOL SUCCINATE ER 50 MG PO TB24
50.0000 mg | ORAL_TABLET | Freq: Every day | ORAL | Status: DC
  Administered 2015-08-22 – 2015-08-23 (×2): 50 mg via ORAL
  Filled 2015-08-22 (×2): qty 1

## 2015-08-22 MED ORDER — ONDANSETRON HCL 4 MG PO TABS: 4.0000 mg | ORAL_TABLET | Freq: Four times a day (QID) | ORAL | Status: DC | PRN

## 2015-08-22 MED ORDER — ACETAMINOPHEN 325 MG PO TABS: 650.0000 mg | ORAL_TABLET | Freq: Four times a day (QID) | ORAL | Status: DC | PRN

## 2015-08-22 MED ORDER — BUDESONIDE 0.25 MG/2ML IN SUSP: 0.2500 mg | Freq: Two times a day (BID) | RESPIRATORY_TRACT | Status: DC

## 2015-08-22 MED ORDER — SODIUM CHLORIDE 0.9% FLUSH
3.0000 mL | Freq: Two times a day (BID) | INTRAVENOUS | Status: DC
  Administered 2015-08-22 – 2015-08-23 (×4): 3 mL via INTRAVENOUS

## 2015-08-22 MED ORDER — LEVOFLOXACIN 750 MG PO TABS
750.0000 mg | ORAL_TABLET | Freq: Every day | ORAL | Status: DC
  Administered 2015-08-22 (×2): 750 mg via ORAL
  Filled 2015-08-22 (×2): qty 1

## 2015-08-22 MED ORDER — SERTRALINE HCL 100 MG PO TABS
100.0000 mg | ORAL_TABLET | Freq: Every day | ORAL | Status: DC
  Administered 2015-08-22 (×2): 100 mg via ORAL
  Filled 2015-08-22 (×2): qty 1

## 2015-08-22 MED ORDER — ENOXAPARIN SODIUM 40 MG/0.4ML ~~LOC~~ SOLN
40.0000 mg | Freq: Every day | SUBCUTANEOUS | Status: DC
  Administered 2015-08-22 (×2): 40 mg via SUBCUTANEOUS
  Filled 2015-08-22 (×2): qty 0.4

## 2015-08-22 MED ORDER — ALPRAZOLAM 0.25 MG PO TABS
0.2500 mg | ORAL_TABLET | Freq: Three times a day (TID) | ORAL | Status: DC | PRN
  Administered 2015-08-22 (×2): 0.25 mg via ORAL
  Filled 2015-08-22 (×2): qty 1

## 2015-08-22 MED ORDER — IPRATROPIUM-ALBUTEROL 0.5-2.5 (3) MG/3ML IN SOLN
3.0000 mL | RESPIRATORY_TRACT | Status: DC
  Administered 2015-08-22 – 2015-08-23 (×7): 3 mL via RESPIRATORY_TRACT
  Filled 2015-08-22 (×8): qty 3

## 2015-08-22 MED ORDER — METHYLPREDNISOLONE SODIUM SUCC 125 MG IJ SOLR
60.0000 mg | Freq: Four times a day (QID) | INTRAMUSCULAR | Status: DC
  Administered 2015-08-22 – 2015-08-23 (×5): 60 mg via INTRAVENOUS
  Filled 2015-08-22 (×5): qty 2

## 2015-08-22 MED ORDER — ACETAMINOPHEN 650 MG RE SUPP: 650.0000 mg | Freq: Four times a day (QID) | RECTAL | Status: DC | PRN

## 2015-08-22 MED ORDER — IPRATROPIUM-ALBUTEROL 0.5-2.5 (3) MG/3ML IN SOLN: 3.0000 mL | RESPIRATORY_TRACT | Status: DC | PRN

## 2015-08-22 MED ORDER — ROPINIROLE HCL 1 MG PO TABS
3.0000 mg | ORAL_TABLET | Freq: Four times a day (QID) | ORAL | Status: DC
  Administered 2015-08-22 – 2015-08-23 (×4): 3 mg via ORAL
  Filled 2015-08-22 (×4): qty 3

## 2015-08-22 NOTE — Care Management (Signed)
Home O2 requested from [facility].

## 2015-08-22 NOTE — Care Management Note (Addendum)
Case Management Note  Patient Details  Name: [facility] MRN: [id number] Date of Birth: 04/20/1946  Subjective/Objective:                  Met with patient to discuss discharge planning. She is from home alone where she states she is independent with daily activities. She is new to O2. She has used [facility] in the past and agrees to it again if needed. Her PCP is Dr. Brandon Melnick and has an appointment (per patient) on Thursday this week. She does not have a nebulizer. She has a daughter for support if needed.  Action/Plan: I have requested nebulizer from [place] [facility] and it has been delivered. I requested Rx from Dr. Verdell Carmine. RNCM will follow for home O2.   Expected Discharge Date:  08/24/15               Expected Discharge Plan:     In-House Referral:     Discharge planning Services  CM Consult  Post Acute Care Choice:    Choice offered to:  Patient  DME Arranged:    DME Agency:     HH Arranged:    HH Agency:  [facility]  Status of Service:  In process, will continue to follow  Medicare Important Message Given:    Date Medicare IM Given:    Medicare IM give by:    Date Additional Medicare IM Given:    Additional Medicare Important Message give by:     If discussed at [place] of Stay Meetings, dates discussed:    Additional Comments:  Marshell Garfinkel, RN 08/22/2015, 9:56 AM

## 2015-08-22 NOTE — Progress Notes (Signed)
New admission this shift. Continues to complain of some shortness of breath at times. Tolerated breathing treatment, but occasionally gets shaky following treatment. Medicated for nausea once, otherwise patient has been pain free, and monitoring respiratory status.

## 2015-08-22 NOTE — Progress Notes (Signed)
Pharmacy Antibiotic Note  [facility] is a 70 y.o. female admitted on 08/21/2015 with AECOPD.  Pharmacy has been consulted for levofloxacin dosing.  Plan: Failed outpatient azithromycin and doxycycline. Pharmacy consulted to dose levofloxacin. Start levofloxacin 750 mg PO daily at bedtime x 5 doses.   Height: 5\' 1"  (154.9 cm) Weight: 177 lb (80.287 kg) IBW/kg (Calculated) : 47.8  Temp (24hrs), Avg:98.4 F (36.9 C), Min:98 F (36.7 C), Max:98.7 F (37.1 C)   Recent Labs Lab 08/21/15 1924  WBC 6.7  CREATININE 0.72    Estimated Creatinine Clearance: 62.8 mL/min (by C-G formula based on Cr of 0.72).    Allergies  Allergen Reactions  . Hydrocodone-Chlorpheniramine Shortness Of Breath  . Prednisone Shortness Of Breath  . Chlorhexidine Rash  . Methadone Other (See Comments)    Reaction: Altered Mental Status  . Sulfa Antibiotics Other (See Comments)    Reaction: unknown  . Benadryl [Diphenhydramine] Anxiety  . Codeine Anxiety  . Diphenhydramine-Zinc Acetate Anxiety    Thank you for allowing pharmacy to be a part of this patient's care.  Laural Benes, Pharm.D., BCPS Clinical Pharmacist 08/22/2015 12:59 AM

## 2015-08-22 NOTE — Progress Notes (Signed)
[facility] [place] NAME: [facility]    MR#:  [id number]  DATE OF BIRTH:  14-Jul-1945  SUBJECTIVE:   Patient is here due to shortness of breath, cough, wheezing and noted to be in COPD exacerbation.  Still has some cough and wheezing but improved since yesterday.  REVIEW OF SYSTEMS:    Review of Systems  Constitutional: Negative for fever and chills.  HENT: Negative for congestion and tinnitus.   Eyes: Negative for blurred vision and double vision.  Respiratory: Positive for cough, shortness of breath and wheezing.   Cardiovascular: Negative for chest pain, orthopnea and PND.  Gastrointestinal: Negative for nausea, vomiting, abdominal pain and diarrhea.  Genitourinary: Negative for dysuria and hematuria.  Neurological: Negative for dizziness, sensory change and focal weakness.  All other systems reviewed and are negative.   Nutrition: Heart healthy Tolerating Diet: Yes Tolerating PT: Await evaluation   DRUG ALLERGIES:   Allergies  Allergen Reactions  . Hydrocodone-Chlorpheniramine Shortness Of Breath  . Prednisone Shortness Of Breath  . Chlorhexidine Rash  . Methadone Other (See Comments)    Reaction: Altered Mental Status  . Sulfa Antibiotics Other (See Comments)    Reaction: unknown  . Benadryl [Diphenhydramine] Anxiety  . Codeine Anxiety  . Diphenhydramine-Zinc Acetate Anxiety    VITALS:  Blood pressure 140/68, pulse 99, temperature 97.5 F (36.4 C), temperature source Oral, resp. rate 18, height 5\' 1"  (1.549 m), weight 78.291 kg (172 lb 9.6 oz), SpO2 95 %.  PHYSICAL EXAMINATION:   Physical Exam  GENERAL:  70 y.o.-year-old patient lying in the bed in no acute distress.  EYES: Pupils equal, round, reactive to light and accommodation. No scleral icterus. Extraocular muscles intact.  HEENT: Head atraumatic, normocephalic. Oropharynx and nasopharynx clear.  NECK:  Supple, no jugular venous distention. No  thyroid enlargement, no tenderness.  LUNGS: Prolonged insp & exp. Phase, diffuse wheezing/rhonchi b/l,  No use of accessory muscles of respiration.  CARDIOVASCULAR: S1, S2 normal. No murmurs, rubs, or gallops.  ABDOMEN: Soft, nontender, nondistended. Bowel sounds present. No organomegaly or mass.  EXTREMITIES: No cyanosis, clubbing or edema b/l.    NEUROLOGIC: Cranial nerves II through XII are intact. No focal Motor or sensory deficits b/l.   PSYCHIATRIC: The patient is alert and oriented x 3.  SKIN: No obvious rash, lesion, or ulcer.    LABORATORY PANEL:   CBC  Recent Labs Lab 08/22/15 0639  WBC 7.4  HGB 13.9  HCT 41.3  PLT 199   ------------------------------------------------------------------------------------------------------------------  Chemistries   Recent Labs Lab 08/21/15 1924 08/22/15 0639  NA 141 134*  K 3.4* 3.7  CL 103 99*  CO2 34* 24  GLUCOSE 116* 207*  BUN 18 22*  CREATININE 0.72 0.78  CALCIUM 10.3 9.4  AST 24  --   ALT 16  --   ALKPHOS 71  --   BILITOT 1.0  --    ------------------------------------------------------------------------------------------------------------------  Cardiac Enzymes  Recent Labs Lab 08/21/15 1924  TROPONINI <0.03   ------------------------------------------------------------------------------------------------------------------  RADIOLOGY:  Dg Chest 2 View  08/21/2015  CLINICAL DATA:  70 year old female with cough and congestion and shortness of breath EXAM: CHEST  2 VIEW COMPARISON:  Radiograph dated 07/09/2014 FINDINGS: Two views of the chest do not demonstrate focal consolidation. There is no pleural effusion or pneumothorax. Mild increased interstitial prominence appears chronic. The cardiac silhouette is within normal limits. No acute osseous pathology. IMPRESSION: No active cardiopulmonary disease. Electronically Signed   By: Milas Hock  Radparvar M.D.   On: 08/21/2015 19:33     ASSESSMENT AND PLAN:    70 year old female with past medical history of hypertension, history of colon cancer, anxiety/depression, sleep apnea, history of previous TIA, COPD with ongoing tobacco abuse, restless leg syndrome who presents to the hospital due to shortness of breath, wheezing and noted to be in COPD exacerbation.  #1 COPD exacerbation-due to underlying bronchitis, ongoing tobacco abuse. -Continue IV steroids, DuoNeb nebs around-the-clock, I will add Pulmicort nebs. -Continue Levaquin, follow blood and sputum cultures. -Assess patient for home oxygen prior to discharge.  #2 essential hypertension-continue Toprol, benazepril.  #3 anxiety-continue as needed Xanax.  #4 restless leg syndrome-continue Requip.  #5 depression-continue Zoloft.   All the records are reviewed and case discussed with Care Management/Social Workerr. Management plans discussed with the patient, family and they are in agreement.  CODE STATUS: Full  DVT Prophylaxis: Lovenox  TOTAL TIME TAKING CARE OF THIS PATIENT: 30 minutes.   POSSIBLE D/C IN 1-2 DAYS, DEPENDING ON CLINICAL CONDITION.   Henreitta Leber M.D on 08/22/2015 at 1:57 PM  Between 7am to 6pm - Pager - [phone]  After 6pm go to [email or web address] - password [email or web address] [facility]  Office  [phone]  CC: Primary care physician; Golden Pop, MD

## 2015-08-23 MED ORDER — BUDESONIDE-FORMOTEROL FUMARATE 160-4.5 MCG/ACT IN AERO: 2.0000 | INHALATION_SPRAY | Freq: Two times a day (BID) | RESPIRATORY_TRACT | Status: DC

## 2015-08-23 MED ORDER — IPRATROPIUM-ALBUTEROL 0.5-2.5 (3) MG/3ML IN SOLN: 3.0000 mL | Freq: Four times a day (QID) | RESPIRATORY_TRACT | Status: DC | PRN

## 2015-08-23 MED ORDER — LEVOFLOXACIN 750 MG PO TABS: 750.0000 mg | ORAL_TABLET | Freq: Every day | ORAL | Status: DC

## 2015-08-23 MED ORDER — PREDNISONE 10 MG PO TABS: ORAL_TABLET | ORAL | Status: DC

## 2015-08-23 MED ORDER — ALBUTEROL SULFATE HFA 108 (90 BASE) MCG/ACT IN AERS: 2.0000 | INHALATION_SPRAY | RESPIRATORY_TRACT | Status: DC | PRN

## 2015-08-23 NOTE — Discharge Summary (Signed)
[facility] [place] NAME: Ann Silva    MR#:  [id number]  DATE OF BIRTH:  Jan 29, 1946  DATE OF ADMISSION:  08/21/2015 ADMITTING PHYSICIAN: Lance Coon, MD  DATE OF DISCHARGE: 08/23/2015 12:53 PM  PRIMARY CARE PHYSICIAN: Golden Pop, MD    ADMISSION DIAGNOSIS:  COPD exacerbation ([place]) [J44.1]  DISCHARGE DIAGNOSIS:  Principal Problem:   COPD exacerbation ([place]) Active Problems:   Depression   Hypertension   Restless legs   SECONDARY DIAGNOSIS:   Past Medical History  Diagnosis Date  . 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 MSSA (methicillin susceptible Staphylococcus aureus) infection   . 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 Colon cancer ([place])   . Anemia   . Depression   . Hypertension   . Sleep apnea   . Atopic neurodermatitis   . Anxiety   . TIA (transient ischemic attack)   . COPD (chronic obstructive pulmonary disease) ([place])   . RLS (restless legs syndrome)     HOSPITAL COURSE:   70 year old female with past medical history of hypertension, history of colon cancer, anxiety/depression, sleep apnea, history of previous TIA, COPD with ongoing tobacco abuse, restless leg syndrome who presents to the hospital due to shortness of breath, wheezing and noted to be in COPD exacerbation.  #1 COPD exacerbation-this was due to underlying bronchitis, ongoing tobacco abuse. -patient was treated with IV steroids, DuoNeb nebs around-lock, Pulmicort nebs and she is significantly improved with aggressive therapy. -She was ambulated on room air and did not qualify for home oxygen. She is now being discharged on oral prednisone taper, Levaquin, DuoNeb nebs as needed, albuterol inhaler as needed, Symbicort. She was also strongly advised to quit smoking. -Her blood and sputum cultures remained negative on the hospital.  #2 essential hypertension-she will continue Toprol, benazepril.  #3 restless leg  syndrome-she will continue Requip.  #4 depression-she will continue Zoloft.   DISCHARGE CONDITIONS:   Stable  CONSULTS OBTAINED:     DRUG ALLERGIES:   Allergies  Allergen Reactions  . Hydrocodone-Chlorpheniramine Shortness Of Breath  . Prednisone Shortness Of Breath  . Chlorhexidine Rash  . Methadone Other (See Comments)    Reaction: Altered Mental Status  . Sulfa Antibiotics Other (See Comments)    Reaction: unknown  . Benadryl [Diphenhydramine] Anxiety  . Codeine Anxiety  . Diphenhydramine-Zinc Acetate Anxiety    DISCHARGE MEDICATIONS:   Discharge Medication List as of 08/23/2015 12:15 PM    START taking these medications   Details  albuterol (PROVENTIL HFA;VENTOLIN HFA) 108 (90 Base) MCG/ACT inhaler Inhale 2 puffs into the lungs every 4 (four) hours as needed for wheezing or shortness of breath., Starting 08/23/2015, Until Discontinued, Print    budesonide-formoterol (SYMBICORT) 160-4.5 MCG/ACT inhaler Inhale 2 puffs into the lungs 2 (two) times daily., Starting 08/23/2015, Until Discontinued, Print    ipratropium-albuterol (DUONEB) 0.5-2.5 (3) MG/3ML SOLN Take 3 mLs by nebulization every 6 (six) hours as needed., Starting 08/23/2015, Until Discontinued, Print    levofloxacin (LEVAQUIN) 750 MG tablet Take 1 tablet (750 mg total) by mouth daily., Starting 08/23/2015, Until Discontinued, Print    predniSONE (DELTASONE) 10 MG tablet Label  & dispense according to the schedule below. 5 Pills PO for 1 day then, 4 Pills PO for 1 day, 3 Pills PO for 1 day, 2 Pills PO for 1 day, 1 Pill PO for 1 days then STOP., Print      CONTINUE  these medications which have NOT CHANGED   Details  benazepril (LOTENSIN) 40 MG tablet Take 1 tablet (40 mg total) by mouth daily., Starting 04/20/2015, Until Discontinued, Normal    calcium carbonate (OS-CAL - DOSED IN MG OF ELEMENTAL CALCIUM) 1250 (500 Ca) MG tablet Take 1 tablet by mouth daily with breakfast., Until Discontinued, Historical Med     ferrous sulfate 325 (65 FE) MG tablet Take 325 mg by mouth 3 (three) times daily with meals., Until Discontinued, Historical Med    fluticasone (FLONASE) 50 MCG/ACT nasal spray Place 1 spray into both nostrils daily as needed for rhinitis. , Until Discontinued, Historical Med    hydrochlorothiazide (HYDRODIURIL) 25 MG tablet Take 1 tablet (25 mg total) by mouth daily., Starting 04/20/2015, Until Discontinued, Normal    metoprolol succinate (TOPROL-XL) 50 MG 24 hr tablet Take 1 tablet (50 mg total) by mouth daily., Starting 04/20/2015, Until Discontinued, Normal    naproxen (NAPROSYN) 500 MG tablet Take 1 tablet (500 mg total) by mouth 2 (two) times daily with a meal., Starting 04/20/2015, Until Discontinued, Normal    ondansetron (ZOFRAN ODT) 4 MG disintegrating tablet Take 1 tablet (4 mg total) by mouth every 8 (eight) hours as needed for nausea or vomiting., Starting 08/17/2015, Until Discontinued, Normal    rOPINIRole (REQUIP) 3 MG tablet Take 1 tablet (3 mg total) by mouth 4 (four) times daily., Starting 04/20/2015, Until Discontinued, Normal    sertraline (ZOLOFT) 100 MG tablet Take 1.5 tablets (150 mg total) by mouth daily., Starting 04/20/2015, Until Discontinued, Normal      STOP taking these medications     doxycycline (ADOXA) 100 MG tablet          DISCHARGE INSTRUCTIONS:   DIET:  Cardiac diet  DISCHARGE CONDITION:  Stable  ACTIVITY:  Activity as tolerated  OXYGEN:  Home Oxygen: No.   Oxygen Delivery: room air  DISCHARGE LOCATION:  home   If you experience worsening of your admission symptoms, develop shortness of breath, life threatening emergency, suicidal or homicidal thoughts you must seek medical attention immediately by calling 911 or calling your MD immediately  if symptoms less severe.  You Must read complete instructions/literature along with all the possible adverse reactions/side effects for all the Medicines you take and that have been prescribed to  you. Take any new Medicines after you have completely understood and accpet all the possible adverse reactions/side effects.   Please note  You were cared for by a hospitalist during your hospital stay. If you have any questions about your discharge medications or the care you received while you were in the hospital after you are discharged, you can call the unit and asked to speak with the hospitalist on call if the hospitalist that took care of you is not available. Once you are discharged, your primary care physician will handle any further medical issues. Please note that NO REFILLS for any discharge medications will be authorized once you are discharged, as it is imperative that you return to your primary care physician (or establish a relationship with a primary care physician if you do not have one) for your aftercare needs so that they can reassess your need for medications and monitor your lab values.     Today   Shortness of breath, bronchospasm and wheezing much improved. Feels better and wants to go home.  VITAL SIGNS:  Blood pressure 122/65, pulse 73, temperature 98.8 F (37.1 C), temperature source Oral, resp. rate 19, height 5\' 1"  (1.549 m),  weight 78.291 kg (172 lb 9.6 oz), SpO2 91 %.  I/O:   Intake/Output Summary (Last 24 hours) at 08/23/15 1715 Last data filed at 08/23/15 0947  Gross per 24 hour  Intake    243 ml  Output      0 ml  Net    243 ml    PHYSICAL EXAMINATION:  GENERAL:  70 y.o.-year-old patient lying in the bed with no acute distress.  EYES: Pupils equal, round, reactive to light and accommodation. No scleral icterus. Extraocular muscles intact.  HEENT: Head atraumatic, normocephalic. Oropharynx and nasopharynx clear.  NECK:  Supple, no jugular venous distention. No thyroid enlargement, no tenderness.  LUNGS: Normal breath sounds bilaterally, minimal end-exp. Wheezing b/l, No rales,rhonchi. No use of accessory muscles of respiration.  CARDIOVASCULAR: S1,  S2 normal. No murmurs, rubs, or gallops.  ABDOMEN: Soft, non-tender, non-distended. Bowel sounds present. No organomegaly or mass.  EXTREMITIES: No pedal edema, cyanosis, or clubbing.  NEUROLOGIC: Cranial nerves II through XII are intact. No focal motor or sensory defecits b/l.  PSYCHIATRIC: The patient is alert and oriented x 3. Good affect.  SKIN: No obvious rash, lesion, or ulcer.   DATA REVIEW:   CBC  Recent Labs Lab 08/22/15 0639  WBC 7.4  HGB 13.9  HCT 41.3  PLT 199    Chemistries   Recent Labs Lab 08/21/15 1924 08/22/15 0639  NA 141 134*  K 3.4* 3.7  CL 103 99*  CO2 34* 24  GLUCOSE 116* 207*  BUN 18 22*  CREATININE 0.72 0.78  CALCIUM 10.3 9.4  AST 24  --   ALT 16  --   ALKPHOS 71  --   BILITOT 1.0  --     Cardiac Enzymes  Recent Labs Lab 08/21/15 1924  TROPONINI <0.03    Microbiology Results  Results for orders placed or performed in visit on 08/14/15  Influenza a and b     Status: None   Collection Time: 08/14/15  3:14 PM  Result Value Ref Range Status   Influenza A Ag, EIA Negative Negative Final   Influenza B Ag, EIA Negative Negative Final   Influenza Comment See note  Final    RADIOLOGY:  Dg Chest 2 View  08/21/2015  CLINICAL DATA:  69 year old female with cough and congestion and shortness of breath EXAM: CHEST  2 VIEW COMPARISON:  Radiograph dated 07/09/2014 FINDINGS: Two views of the chest do not demonstrate focal consolidation. There is no pleural effusion or pneumothorax. Mild increased interstitial prominence appears chronic. The cardiac silhouette is within normal limits. No acute osseous pathology. IMPRESSION: No active cardiopulmonary disease. Electronically Signed   By: Anner Crete M.D.   On: 08/21/2015 19:33      Management plans discussed with the patient, family and they are in agreement.  CODE STATUS:  Code Status History    Date Active Date Inactive Code Status Order ID Comments User Context   08/22/2015 12:44 AM  08/23/2015  3:58 PM Full Code [id number]  Lance Coon, MD Inpatient      TOTAL TIME TAKING CARE OF THIS PATIENT: 40 minutes.    Henreitta Leber M.D on 08/23/2015 at 5:15 PM  Between 7am to 6pm - Pager - [phone]  After 6pm go to [email or web address] - password [email or web address] [facility]  Office  [phone]  CC: Primary care physician; Golden Pop, MD

## 2015-08-23 NOTE — Progress Notes (Signed)
Pt discharged home. DC instructions provided and explained. Medications reviewed. Rx given. All questions answered. Pt stable at discharge.

## 2015-08-23 NOTE — Care Management Important Message (Signed)
Important Message  Patient Details  Name: DONDI CLARIDA MRN: [id number] Date of Birth: 17-Aug-1945   Medicare Important Message Given:  Yes    Juliann Pulse A Zayne Draheim 08/23/2015, 10:34 AM

## 2015-08-24 ENCOUNTER — Ambulatory Visit: Payer: Medicare Other | Admitting: Family Medicine

## 2015-08-28 ENCOUNTER — Encounter: Payer: Self-pay | Admitting: Family Medicine

## 2015-08-28 ENCOUNTER — Ambulatory Visit (INDEPENDENT_AMBULATORY_CARE_PROVIDER_SITE_OTHER): Payer: Medicare Other | Admitting: Family Medicine

## 2015-08-28 VITALS — BP 123/82 | HR 78 | Temp 98.4°F | Ht 61.0 in | Wt 180.0 lb

## 2015-08-28 DIAGNOSIS — B370 Candidal stomatitis: Secondary | ICD-10-CM

## 2015-08-28 DIAGNOSIS — J441 Chronic obstructive pulmonary disease with (acute) exacerbation: Secondary | ICD-10-CM | POA: Diagnosis not present

## 2015-08-28 MED ORDER — FLUCONAZOLE 100 MG PO TABS: 100.0000 mg | ORAL_TABLET | Freq: Every day | ORAL | Status: DC

## 2015-08-28 NOTE — Assessment & Plan Note (Addendum)
Resolved. Finish antibiotics and steroids. Call with any concerns. Will check in with her pharmacist to see which COPD medicine is the cheapest for her and will let us know

## 2015-08-28 NOTE — Progress Notes (Signed)
BP 123/82 mmHg  Pulse 78  Temp(Src) 98.4 F (36.9 C)  Ht 5\' 1"  (1.549 m)  Wt 180 lb (81.647 kg)  BMI 34.03 kg/m2  SpO2 95%   Subjective:    Patient ID: Ann Silva, female    DOB: 1946/04/06, 70 y.o.   MRN: [id number]  HPI: Ann Silva is a 70 y.o. female  Chief Complaint  Patient presents with  . [place]- had to go to the ER as her bronchitis got worse. Had to have IV steroids and then steroid taper on the way home. Feeling much better now, but would like to get off the steroids. Has thrush in her mouth Time since discharge: 4 days Hospital/facility: [facility] Diagnosis: COPD exacerbation/Bronchitis Procedures/tests: CXR Consultants: None New medications: prednisone taper Discharge instructions: Follow up here  Status: better  Relevant past medical, surgical, family and social history reviewed and updated as indicated. Interim medical history since our last visit reviewed. Allergies and medications reviewed and updated.  Review of Systems  Constitutional: Negative.   HENT: Negative.   Respiratory: Positive for cough and wheezing. Negative for apnea, choking, chest tightness, shortness of breath and stridor.   Cardiovascular: Negative.   Psychiatric/Behavioral: Negative.     Per HPI unless specifically indicated above     Objective:    BP 123/82 mmHg  Pulse 78  Temp(Src) 98.4 F (36.9 C)  Ht 5\' 1"  (1.549 m)  Wt 180 lb (81.647 kg)  BMI 34.03 kg/m2  SpO2 95%  Wt Readings from Last 3 Encounters:  08/28/15 180 lb (81.647 kg)  08/22/15 172 lb 9.6 oz (78.291 kg)  08/17/15 177 lb (80.287 kg)    Physical Exam  Constitutional: She is oriented to person, place, and time. She appears well-developed and well-nourished. No distress.  HENT:  Head: Normocephalic and atraumatic.  Right Ear: Hearing normal.  Left Ear: Hearing normal.  Nose: Nose normal.  Thrush on tongue  Eyes: Conjunctivae and lids are normal. Right eye exhibits no  discharge. Left eye exhibits no discharge. No scleral icterus.  Pulmonary/Chest: Effort normal. No respiratory distress.  Musculoskeletal: Normal range of motion.  Neurological: She is alert and oriented to person, place, and time.  Skin: Skin is intact. No rash noted.  Psychiatric: She has a normal mood and affect. Her speech is normal and behavior is normal. Judgment and thought content normal. Cognition and memory are normal.    Results for orders placed or performed during the hospital encounter of 08/21/15  CBC  Result Value Ref Range   WBC 6.7 3.6 - 11.0 K/uL   RBC 4.65 3.80 - 5.20 MIL/uL   Hemoglobin 14.4 12.0 - 16.0 g/dL   HCT 42.0 35.0 - 47.0 %   MCV 90.3 80.0 - 100.0 fL   MCH 30.9 26.0 - 34.0 pg   MCHC 34.2 32.0 - 36.0 g/dL   RDW 14.1 11.5 - 14.5 %   Platelets 226 150 - 440 K/uL  Comprehensive metabolic panel  Result Value Ref Range   Sodium 141 135 - 145 mmol/L   Potassium 3.4 (L) 3.5 - 5.1 mmol/L   Chloride 103 101 - 111 mmol/L   CO2 34 (H) 22 - 32 mmol/L   Glucose, Bld 116 (H) 65 - 99 mg/dL   BUN 18 6 - 20 mg/dL   Creatinine, Ser 0.72 0.44 - 1.00 mg/dL   Calcium 10.3 8.9 - 10.3 mg/dL   Total Protein 7.2 6.5 - 8.1 g/dL  Albumin 3.8 3.5 - 5.0 g/dL   AST 24 15 - 41 U/L   ALT 16 14 - 54 U/L   Alkaline Phosphatase 71 38 - 126 U/L   Total Bilirubin 1.0 0.3 - 1.2 mg/dL   GFR calc non Af Amer >60 >60 mL/min   GFR calc Af Amer >60 >60 mL/min   Anion gap 4 (L) 5 - 15  Troponin I  Result Value Ref Range   Troponin I <0.03 <0.031 ng/mL  Basic metabolic panel  Result Value Ref Range   Sodium 134 (L) 135 - 145 mmol/L   Potassium 3.7 3.5 - 5.1 mmol/L   Chloride 99 (L) 101 - 111 mmol/L   CO2 24 22 - 32 mmol/L   Glucose, Bld 207 (H) 65 - 99 mg/dL   BUN 22 (H) 6 - 20 mg/dL   Creatinine, Ser 0.78 0.44 - 1.00 mg/dL   Calcium 9.4 8.9 - 10.3 mg/dL   GFR calc non Af Amer >60 >60 mL/min   GFR calc Af Amer >60 >60 mL/min   Anion gap 11 5 - 15  CBC  Result Value Ref Range    WBC 7.4 3.6 - 11.0 K/uL   RBC 4.51 3.80 - 5.20 MIL/uL   Hemoglobin 13.9 12.0 - 16.0 g/dL   HCT 41.3 35.0 - 47.0 %   MCV 91.7 80.0 - 100.0 fL   MCH 30.9 26.0 - 34.0 pg   MCHC 33.7 32.0 - 36.0 g/dL   RDW 14.2 11.5 - 14.5 %   Platelets 199 150 - 440 K/uL      Assessment & Plan:   Problem List Items Addressed This Visit      Respiratory   COPD exacerbation ([place]) - Primary    Resolved. Finish antibiotics and steroids. Call with any concerns. Will check in with her pharmacist to see which COPD medicine is the cheapest for her and will let us know       Other Visit Diagnoses    Oral thrush        Diflucan sent to her pharmacy. Call with any concerns.     Relevant Medications    fluconazole (DIFLUCAN) 100 MG tablet        Follow up plan: No Follow-up on file.

## 2015-09-11 DIAGNOSIS — Z0000 Encounter for general adult medical examination without abnormal findings: Secondary | ICD-10-CM | POA: Diagnosis not present

## 2015-09-11 DIAGNOSIS — N3946 Mixed incontinence: Secondary | ICD-10-CM | POA: Diagnosis not present

## 2015-09-11 DIAGNOSIS — N3020 Other chronic cystitis without hematuria: Secondary | ICD-10-CM | POA: Diagnosis not present

## 2015-09-14 ENCOUNTER — Encounter: Payer: Self-pay | Admitting: *Deleted

## 2015-10-24 ENCOUNTER — Encounter: Payer: Self-pay | Admitting: Family Medicine

## 2015-10-24 ENCOUNTER — Ambulatory Visit (INDEPENDENT_AMBULATORY_CARE_PROVIDER_SITE_OTHER): Payer: Medicare Other | Admitting: Family Medicine

## 2015-10-24 VITALS — BP 114/69 | HR 69 | Temp 98.1°F | Ht 60.7 in | Wt 179.0 lb

## 2015-10-24 DIAGNOSIS — F329 Major depressive disorder, single episode, unspecified: Secondary | ICD-10-CM

## 2015-10-24 DIAGNOSIS — F32A Depression, unspecified: Secondary | ICD-10-CM

## 2015-10-24 DIAGNOSIS — G2581 Restless legs syndrome: Secondary | ICD-10-CM | POA: Diagnosis not present

## 2015-10-24 DIAGNOSIS — I10 Essential (primary) hypertension: Secondary | ICD-10-CM

## 2015-10-24 NOTE — Assessment & Plan Note (Signed)
The current medical regimen is effective;  continue present plan and medications.  

## 2015-10-24 NOTE — Progress Notes (Signed)
BP 114/69 mmHg  Pulse 69  Temp(Src) 98.1 F (36.7 C)  Ht 5' 0.7" (1.542 m)  Wt 179 lb (81.194 kg)  BMI 34.15 kg/m2  SpO2 98%   Subjective:    Patient ID: Ann Silva, female    DOB: 11-08-1945, 70 y.o.   MRN: [id number]  HPI: Ann Silva is a 70 y.o. female  Chief Complaint  Patient presents with  . Depression  . restless legs  . Hypertension  Patient with a lot of heavy stuff going on with sister nursing home and dying other family issues Restless legs all in all are doing fair Depression doing about as best as it can Blood pressure actually doing well Breathing is also doing okay.   Relevant past medical, surgical, family and social history reviewed and updated as indicated. Interim medical history since our last visit reviewed. Allergies and medications reviewed and updated.  Review of Systems  Constitutional: Negative.   Respiratory: Negative.   Cardiovascular: Negative.    Patient also has some edema both legs gets better at night doesn't want to wear support hose has  modest low salt diet also discussed weight loss Per HPI unless specifically indicated above     Objective:    BP 114/69 mmHg  Pulse 69  Temp(Src) 98.1 F (36.7 C)  Ht 5' 0.7" (1.542 m)  Wt 179 lb (81.194 kg)  BMI 34.15 kg/m2  SpO2 98%  Wt Readings from Last 3 Encounters:  10/24/15 179 lb (81.194 kg)  08/28/15 180 lb (81.647 kg)  08/22/15 172 lb 9.6 oz (78.291 kg)    Physical Exam  Constitutional: She is oriented to person, place, and time. She appears well-developed and well-nourished. No distress.  HENT:  Head: Normocephalic and atraumatic.  Right Ear: Hearing normal.  Left Ear: Hearing normal.  Nose: Nose normal.  Eyes: Conjunctivae and lids are normal. Right eye exhibits no discharge. Left eye exhibits no discharge. No scleral icterus.  Cardiovascular: Normal rate, regular rhythm and normal heart sounds.   Pulmonary/Chest: Effort normal and breath sounds normal. No  respiratory distress.  Musculoskeletal: Normal range of motion. She exhibits edema.  Neurological: She is alert and oriented to person, place, and time.  Skin: Skin is intact. No rash noted.  Psychiatric: She has a normal mood and affect. Her speech is normal and behavior is normal. Judgment and thought content normal. Cognition and memory are normal.    Results for orders placed or performed during the hospital encounter of 08/21/15  CBC  Result Value Ref Range   WBC 6.7 3.6 - 11.0 K/uL   RBC 4.65 3.80 - 5.20 MIL/uL   Hemoglobin 14.4 12.0 - 16.0 g/dL   HCT 42.0 35.0 - 47.0 %   MCV 90.3 80.0 - 100.0 fL   MCH 30.9 26.0 - 34.0 pg   MCHC 34.2 32.0 - 36.0 g/dL   RDW 14.1 11.5 - 14.5 %   Platelets 226 150 - 440 K/uL  Comprehensive metabolic panel  Result Value Ref Range   Sodium 141 135 - 145 mmol/L   Potassium 3.4 (L) 3.5 - 5.1 mmol/L   Chloride 103 101 - 111 mmol/L   CO2 34 (H) 22 - 32 mmol/L   Glucose, Bld 116 (H) 65 - 99 mg/dL   BUN 18 6 - 20 mg/dL   Creatinine, Ser 0.72 0.44 - 1.00 mg/dL   Calcium 10.3 8.9 - 10.3 mg/dL   Total Protein 7.2 6.5 - 8.1 g/dL   Albumin 3.8  3.5 - 5.0 g/dL   AST 24 15 - 41 U/L   ALT 16 14 - 54 U/L   Alkaline Phosphatase 71 38 - 126 U/L   Total Bilirubin 1.0 0.3 - 1.2 mg/dL   GFR calc non Af Amer >60 >60 mL/min   GFR calc Af Amer >60 >60 mL/min   Anion gap 4 (L) 5 - 15  Troponin I  Result Value Ref Range   Troponin I <0.03 <0.031 ng/mL  Basic metabolic panel  Result Value Ref Range   Sodium 134 (L) 135 - 145 mmol/L   Potassium 3.7 3.5 - 5.1 mmol/L   Chloride 99 (L) 101 - 111 mmol/L   CO2 24 22 - 32 mmol/L   Glucose, Bld 207 (H) 65 - 99 mg/dL   BUN 22 (H) 6 - 20 mg/dL   Creatinine, Ser 0.78 0.44 - 1.00 mg/dL   Calcium 9.4 8.9 - 10.3 mg/dL   GFR calc non Af Amer >60 >60 mL/min   GFR calc Af Amer >60 >60 mL/min   Anion gap 11 5 - 15  CBC  Result Value Ref Range   WBC 7.4 3.6 - 11.0 K/uL   RBC 4.51 3.80 - 5.20 MIL/uL   Hemoglobin 13.9  12.0 - 16.0 g/dL   HCT 41.3 35.0 - 47.0 %   MCV 91.7 80.0 - 100.0 fL   MCH 30.9 26.0 - 34.0 pg   MCHC 33.7 32.0 - 36.0 g/dL   RDW 14.2 11.5 - 14.5 %   Platelets 199 150 - 440 K/uL      Assessment & Plan:   Problem List Items Addressed This Visit      Cardiovascular and Mediastinum   Hypertension - Primary    The current medical regimen is effective;  continue present plan and medications.         Other   Depression    The current medical regimen is effective;  continue present plan and medications.       Restless legs    The current medical regimen is effective;  continue present plan and medications.           Follow up plan: Return in about 6 months (around 04/24/2016) for Physical Exam.

## 2015-10-25 ENCOUNTER — Inpatient Hospital Stay: Payer: Medicare Other | Attending: Family Medicine

## 2015-10-25 ENCOUNTER — Other Ambulatory Visit: Payer: Self-pay

## 2015-10-25 DIAGNOSIS — R799 Abnormal finding of blood chemistry, unspecified: Secondary | ICD-10-CM | POA: Insufficient documentation

## 2015-10-25 LAB — CBC WITH DIFFERENTIAL/PLATELET
Basophils Absolute: 0 10*3/uL (ref 0–0.1)
Basophils Relative: 1 %
EOS PCT: 5 %
Eosinophils Absolute: 0.4 10*3/uL (ref 0–0.7)
HCT: 40.9 % (ref 35.0–47.0)
HEMOGLOBIN: 14.1 g/dL (ref 12.0–16.0)
LYMPHS ABS: 1.6 10*3/uL (ref 1.0–3.6)
LYMPHS PCT: 20 %
MCH: 30.9 pg (ref 26.0–34.0)
MCHC: 34.4 g/dL (ref 32.0–36.0)
MCV: 90 fL (ref 80.0–100.0)
Monocytes Absolute: 0.5 10*3/uL (ref 0.2–0.9)
Monocytes Relative: 6 %
Neutro Abs: 5.4 10*3/uL (ref 1.4–6.5)
Neutrophils Relative %: 68 %
PLATELETS: 191 10*3/uL (ref 150–440)
RBC: 4.55 MIL/uL (ref 3.80–5.20)
RDW: 14.5 % (ref 11.5–14.5)
WBC: 8 10*3/uL (ref 3.6–11.0)

## 2015-10-25 LAB — IRON AND TIBC
Iron: 63 ug/dL (ref 28–170)
SATURATION RATIOS: 15 % (ref 10.4–31.8)
TIBC: 420 ug/dL (ref 250–450)
UIBC: 357 ug/dL

## 2015-10-25 LAB — FERRITIN: Ferritin: 56 ng/mL (ref 11–307)

## 2015-10-26 LAB — PROTEIN ELECTROPHORESIS, SERUM
A/G RATIO SPE: 1.1 (ref 0.7–1.7)
Albumin ELP: 3.4 g/dL (ref 2.9–4.4)
Alpha-1-Globulin: 0.2 g/dL (ref 0.0–0.4)
Alpha-2-Globulin: 0.7 g/dL (ref 0.4–1.0)
Beta Globulin: 1.2 g/dL (ref 0.7–1.3)
GLOBULIN, TOTAL: 3 g/dL (ref 2.2–3.9)
Gamma Globulin: 0.9 g/dL (ref 0.4–1.8)
Total Protein ELP: 6.4 g/dL (ref 6.0–8.5)

## 2015-10-31 ENCOUNTER — Other Ambulatory Visit: Payer: Self-pay | Admitting: Family Medicine

## 2015-10-31 ENCOUNTER — Inpatient Hospital Stay: Payer: Medicare Other | Admitting: Family Medicine

## 2015-11-01 ENCOUNTER — Inpatient Hospital Stay: Payer: Medicare Other | Admitting: Family Medicine

## 2015-11-15 ENCOUNTER — Inpatient Hospital Stay: Payer: Medicare Other | Attending: Family Medicine | Admitting: Family Medicine

## 2015-11-15 VITALS — BP 135/80 | HR 70 | Temp 97.1°F | Resp 20 | Wt 181.0 lb

## 2015-11-15 DIAGNOSIS — R748 Abnormal levels of other serum enzymes: Secondary | ICD-10-CM | POA: Diagnosis not present

## 2015-11-15 DIAGNOSIS — Z8673 Personal history of transient ischemic attack (TIA), and cerebral infarction without residual deficits: Secondary | ICD-10-CM | POA: Diagnosis not present

## 2015-11-15 DIAGNOSIS — I10 Essential (primary) hypertension: Secondary | ICD-10-CM | POA: Diagnosis not present

## 2015-11-15 DIAGNOSIS — F329 Major depressive disorder, single episode, unspecified: Secondary | ICD-10-CM | POA: Insufficient documentation

## 2015-11-15 DIAGNOSIS — F419 Anxiety disorder, unspecified: Secondary | ICD-10-CM | POA: Diagnosis not present

## 2015-11-15 DIAGNOSIS — Z96649 Presence of unspecified artificial hip joint: Secondary | ICD-10-CM | POA: Diagnosis not present

## 2015-11-15 DIAGNOSIS — Z85038 Personal history of other malignant neoplasm of large intestine: Secondary | ICD-10-CM | POA: Insufficient documentation

## 2015-11-15 DIAGNOSIS — G2581 Restless legs syndrome: Secondary | ICD-10-CM | POA: Diagnosis not present

## 2015-11-15 DIAGNOSIS — G4730 Sleep apnea, unspecified: Secondary | ICD-10-CM | POA: Insufficient documentation

## 2015-11-15 DIAGNOSIS — Z79899 Other long term (current) drug therapy: Secondary | ICD-10-CM | POA: Diagnosis not present

## 2015-11-15 DIAGNOSIS — J449 Chronic obstructive pulmonary disease, unspecified: Secondary | ICD-10-CM | POA: Diagnosis not present

## 2015-11-15 DIAGNOSIS — F17210 Nicotine dependence, cigarettes, uncomplicated: Secondary | ICD-10-CM | POA: Diagnosis not present

## 2015-11-15 DIAGNOSIS — R768 Other specified abnormal immunological findings in serum: Secondary | ICD-10-CM

## 2015-11-15 NOTE — Progress Notes (Signed)
[name]  [phone]  [phone]     Ann Silva DOB: 1945-09-22  MR#: [id number]  [id number]  Patient Care Team: [name], MD as PCP - General (Family Medicine) [name]. [name], MD as Attending Physician (Orthopedic Surgery) [name], MD as Consulting Physician (Dermatology) [name], MD (Family Medicine) [name], MD as Attending Physician (Cardiology) [name], MD (Gastroenterology)  CHIEF COMPLAINT:  Chief Complaint  Patient presents with  . 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  (09/19/14 - serum IgA 454 (ref range 87-352), serum IgE 285 (ref range 0-100). Hemoccult negative x 2. Serum iron 44, TIBC 506.4, iron saturation 9%, ferritin 21.  Feb 2016 - serum IgA 456).  INTERVAL HISTORY:  Patient is here for further follow-up regarding elevated IgA levels. She has not been seen in our clinic in over 1 year, was last seen by Dr. [name]. Patient reports having no acute symptoms in the last year and has been followed by her primary care provider. She has been hospitalized with COPD exacerbation. She overall reports feeling very well, has some mild lower extremity edema but this is chronic and unchanged. Denies any fevers, night sweats.  REVIEW OF SYSTEMS:   Review of Systems  Constitutional: Negative for fever, chills, weight loss, malaise/fatigue and diaphoresis.  HENT: Negative.   Eyes: Negative.   Respiratory: Negative for cough, hemoptysis, sputum production, shortness of breath and wheezing.   Cardiovascular: Negative for chest pain, palpitations, orthopnea, claudication, leg swelling and PND.  Gastrointestinal: Negative for heartburn, nausea, vomiting, abdominal pain, diarrhea, constipation, blood in stool and melena.  Genitourinary: Negative.   Musculoskeletal: Negative.   Skin:  Negative.   Neurological: Negative for dizziness, tingling, focal weakness, seizures and weakness.  Endo/Heme/Allergies: Does not bruise/bleed easily.  Psychiatric/Behavioral: Negative for depression. The patient is not nervous/anxious and does not have insomnia.     As per HPI. Otherwise, a complete review of systems is negatve.   PAST MEDICAL HISTORY: Past Medical History  Diagnosis Date  . 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 MSSA (methicillin susceptible Staphylococcus aureus) infection   . 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 Colon cancer ([place])   . Anemia   . Depression   . Hypertension   . Sleep apnea   . Atopic neurodermatitis   . Anxiety   . TIA (transient ischemic attack)   . COPD (chronic obstructive pulmonary disease) ([place])   . RLS (restless legs syndrome)     PAST SURGICAL HISTORY: Past Surgical History  Procedure Laterality Date  . Total hip arthroplasty    . Cholecystectomy    . Colon surgery    . Abdominal hysterectomy    . Revision of total hip arthroplasty    . Resection total hip    . Joint replacement      FAMILY HISTORY Family History  Problem Relation Age of Onset  . Diabetes Sister   . Mental illness Sister   . Cancer Mother   . Stroke Father     GYNECOLOGIC HISTORY:  No LMP recorded. Patient has had a hysterectomy.     ADVANCED DIRECTIVES:    HEALTH MAINTENANCE: Social History  Substance Use Topics  . Smoking status: Current Every Day Smoker -- 0.50 packs/day    Types: Cigarettes    Start date:  07/08/1964  . Smokeless tobacco: Never Used  . Alcohol Use: No      Allergies  Allergen Reactions  . Hydrocodone-Chlorpheniramine Shortness Of Breath  . Prednisone Shortness Of Breath  . Chlorhexidine Rash  . Methadone Other (See Comments)    Reaction: Altered Mental Status  . Sulfa Antibiotics Other (See Comments)    Reaction: unknown  . Benadryl [Diphenhydramine] Anxiety  . Codeine Anxiety  .  Diphenhydramine-Zinc Acetate Anxiety    Current Outpatient Prescriptions  Medication Sig Dispense Refill  . albuterol (PROVENTIL HFA;VENTOLIN HFA) 108 (90 Base) MCG/ACT inhaler Inhale 2 puffs into the lungs every 4 (four) hours as needed for wheezing or shortness of breath. 1 Inhaler 2  . benazepril (LOTENSIN) 40 MG tablet Take 1 tablet (40 mg total) by mouth daily. 90 tablet 4  . calcium carbonate (OS-CAL - DOSED IN MG OF ELEMENTAL CALCIUM) 1250 (500 Ca) MG tablet Take 1 tablet by mouth daily with breakfast.    . ferrous sulfate 325 (65 FE) MG tablet Take 325 mg by mouth 3 (three) times daily with meals.    . fluticasone (FLONASE) 50 MCG/ACT nasal spray Place 1 spray into both nostrils daily as needed for rhinitis.     . hydrochlorothiazide (HYDRODIURIL) 25 MG tablet Take 1 tablet (25 mg total) by mouth daily. (Patient taking differently: Take 25 mg by mouth at bedtime. ) 90 tablet 4  . ipratropium-albuterol (DUONEB) 0.5-2.5 (3) MG/3ML SOLN Take 3 mLs by nebulization every 6 (six) hours as needed. 360 mL 0  . metoprolol succinate (TOPROL-XL) 50 MG 24 hr tablet Take 1 tablet (50 mg total) by mouth daily. 90 tablet 4  . naproxen (NAPROSYN) 500 MG tablet Take 1 tablet (500 mg total) by mouth 2 (two) times daily with a meal. 180 tablet 1  . ondansetron (ZOFRAN ODT) 4 MG disintegrating tablet Take 1 tablet (4 mg total) by mouth every 8 (eight) hours as needed for nausea or vomiting. 20 tablet 0  . rOPINIRole (REQUIP) 3 MG tablet Take 1 tablet (3 mg total) by mouth 4 (four) times daily. 360 tablet 4  . sertraline (ZOLOFT) 100 MG tablet Take 1.5 tablets (150 mg total) by mouth daily. (Patient taking differently: Take 100 mg by mouth at bedtime. ) 135 tablet 4   No current facility-administered medications for this visit.    OBJECTIVE: BP 135/80 mmHg  Pulse 70  Temp(Src) 97.1 F (36.2 C) (Tympanic)  Resp 20  Wt 181 lb (82.1 kg)   Body mass index is 34.53 kg/(m^2).    ECOG FS:0 -  Asymptomatic  General: Well-developed, well-nourished, no acute distress. Eyes: Pink conjunctiva, anicteric sclera. HEENT: Normocephalic, moist mucous membranes, clear oropharnyx. Lungs: Clear to auscultation bilaterally. Heart: Regular rate and rhythm. No rubs, murmurs, or gallops. Abdomen: Soft, nontender, nondistended. No organomegaly noted, normoactive bowel sounds. Musculoskeletal: BLE 1+ edema, cyanosis, or clubbing. Neuro: Alert, answering all questions appropriately. Cranial nerves grossly intact. Skin: No rashes or petechiae noted. Psych: Normal affect. Lymphatics: No cervical, clavicular LAD.   LAB RESULTS:  No visits with results within 3 Day(s) from this visit. Latest known visit with results is:  Appointment on 10/25/2015  Component Date Value Ref Range Status  . WBC 10/25/2015 8.0  3.6 - 11.0 K/uL Final  . RBC 10/25/2015 4.55  3.80 - 5.20 MIL/uL Final  . Hemoglobin 10/25/2015 14.1  12.0 - 16.0 g/dL Final  . HCT 10/25/2015 40.9  35.0 - 47.0 % Final  . MCV 10/25/2015 90.0  80.0 -  100.0 fL Final  . MCH 10/25/2015 30.9  26.0 - 34.0 pg Final  . MCHC 10/25/2015 34.4  32.0 - 36.0 g/dL Final  . RDW 10/25/2015 14.5  11.5 - 14.5 % Final  . Platelets 10/25/2015 191  150 - 440 K/uL Final  . Neutrophils Relative % 10/25/2015 68   Final  . Neutro Abs 10/25/2015 5.4  1.4 - 6.5 K/uL Final  . Lymphocytes Relative 10/25/2015 20   Final  . Lymphs Abs 10/25/2015 1.6  1.0 - 3.6 K/uL Final  . Monocytes Relative 10/25/2015 6   Final  . Monocytes Absolute 10/25/2015 0.5  0.2 - 0.9 K/uL Final  . Eosinophils Relative 10/25/2015 5   Final  . Eosinophils Absolute 10/25/2015 0.4  0 - 0.7 K/uL Final  . Basophils Relative 10/25/2015 1   Final  . Basophils Absolute 10/25/2015 0.0  0 - 0.1 K/uL Final  . Ferritin 10/25/2015 56  11 - 307 ng/mL Final  . Iron 10/25/2015 63  28 - 170 ug/dL Final  . TIBC 10/25/2015 420  250 - 450 ug/dL Final  . Saturation Ratios 10/25/2015 15  10.4 - 31.8 % Final   . UIBC 10/25/2015 357   Final  . Total Protein ELP 10/25/2015 6.4  6.0 - 8.5 g/dL Final  . Albumin ELP 10/25/2015 3.4  2.9 - 4.4 g/dL Final  . Alpha-1-Globulin 10/25/2015 0.2  0.0 - 0.4 g/dL Final  . Alpha-2-Globulin 10/25/2015 0.7  0.4 - 1.0 g/dL Final  . Beta Globulin 10/25/2015 1.2  0.7 - 1.3 g/dL Final  . Gamma Globulin 10/25/2015 0.9  0.4 - 1.8 g/dL Final  . M-Spike, % 10/25/2015 Not Observed  Not Observed g/dL Final  . SPE Interp. 10/25/2015 Comment   Final   Comment: (NOTE) The SPE pattern appears essentially unremarkable. Evidence of monoclonal protein is not apparent. Performed At: [facility] [place], [place] [id number] Lindon Romp MD [id number]   . Comment 10/25/2015 Comment   Final   Comment: (NOTE) Protein electrophoresis scan will follow via computer, mail, or courier delivery.   Marland Kitchen GLOBULIN, TOTAL 10/25/2015 3.0  2.2 - 3.9 g/dL Corrected  . A/G Ratio 10/25/2015 1.1  0.7 - 1.7 Corrected    STUDIES: No results found.  ASSESSMENT: Elevated serum IgA levels.   PLAN:   1. Patient is seen for reevaluation of elevated serum IgA levels. However today there is no observed abnormalities in her SPEP, iron panel, CBC, alpha globulins. Patient's lab results from April 2016 revealed as well as a negative U IEP as well as flow cytometry. Patient is requesting to discontinue visits at the cancer center as she would prefer to follow with her PCP.  Discussed with on call provider and will release from clinic. She may call our office at anytime if she needs to return or if labs are persistently abnormal.  Patient expressed understanding and was in agreement with this plan. She also understands that She can call clinic at any time with any questions, concerns, or complaints.   Dr. Oliva Bustard was available for consultation and review of plan of care for this patient.   Evlyn Kanner, NP   11/15/2015 4:00 PM

## 2015-11-15 NOTE — Progress Notes (Signed)
Patient here today for routine follow up regarding elevated IGA levels, results from labs drawn last week. Patient denies any concerns today.

## 2015-11-27 ENCOUNTER — Encounter: Payer: Self-pay | Admitting: *Deleted

## 2015-11-27 ENCOUNTER — Other Ambulatory Visit: Payer: Self-pay

## 2015-11-27 ENCOUNTER — Emergency Department: Payer: Medicare Other

## 2015-11-27 ENCOUNTER — Inpatient Hospital Stay
Admission: EM | Admit: 2015-11-27 | Discharge: 2015-11-29 | DRG: 192 | Disposition: A | Discharge status: Home / Self Care | Payer: Medicare Other | Attending: Internal Medicine | Admitting: Internal Medicine

## 2015-11-27 DIAGNOSIS — Z79899 Other long term (current) drug therapy: Secondary | ICD-10-CM

## 2015-11-27 DIAGNOSIS — G4730 Sleep apnea, unspecified: Secondary | ICD-10-CM | POA: Diagnosis present

## 2015-11-27 DIAGNOSIS — Z8673 Personal history of transient ischemic attack (TIA), and cerebral infarction without residual deficits: Secondary | ICD-10-CM | POA: Diagnosis not present

## 2015-11-27 DIAGNOSIS — F32A Depression, unspecified: Secondary | ICD-10-CM

## 2015-11-27 DIAGNOSIS — Z9889 Other specified postprocedural states: Secondary | ICD-10-CM | POA: Diagnosis not present

## 2015-11-27 DIAGNOSIS — Z818 Family history of other mental and behavioral disorders: Secondary | ICD-10-CM

## 2015-11-27 DIAGNOSIS — Z90710 Acquired absence of both cervix and uterus: Secondary | ICD-10-CM | POA: Diagnosis not present

## 2015-11-27 DIAGNOSIS — R0602 Shortness of breath: Secondary | ICD-10-CM | POA: Diagnosis not present

## 2015-11-27 DIAGNOSIS — Z823 Family history of stroke: Secondary | ICD-10-CM

## 2015-11-27 DIAGNOSIS — Z9049 Acquired absence of other specified parts of digestive tract: Secondary | ICD-10-CM

## 2015-11-27 DIAGNOSIS — G2581 Restless legs syndrome: Secondary | ICD-10-CM

## 2015-11-27 DIAGNOSIS — F329 Major depressive disorder, single episode, unspecified: Secondary | ICD-10-CM

## 2015-11-27 DIAGNOSIS — Z96649 Presence of unspecified artificial hip joint: Secondary | ICD-10-CM | POA: Diagnosis present

## 2015-11-27 DIAGNOSIS — F17210 Nicotine dependence, cigarettes, uncomplicated: Secondary | ICD-10-CM | POA: Diagnosis not present

## 2015-11-27 DIAGNOSIS — Z882 Allergy status to sulfonamides status: Secondary | ICD-10-CM | POA: Diagnosis not present

## 2015-11-27 DIAGNOSIS — D72819 Decreased white blood cell count, unspecified: Secondary | ICD-10-CM | POA: Diagnosis present

## 2015-11-27 DIAGNOSIS — Z809 Family history of malignant neoplasm, unspecified: Secondary | ICD-10-CM

## 2015-11-27 DIAGNOSIS — Z886 Allergy status to analgesic agent status: Secondary | ICD-10-CM

## 2015-11-27 DIAGNOSIS — Z7951 Long term (current) use of inhaled steroids: Secondary | ICD-10-CM | POA: Diagnosis not present

## 2015-11-27 DIAGNOSIS — Z85038 Personal history of other malignant neoplasm of large intestine: Secondary | ICD-10-CM | POA: Diagnosis not present

## 2015-11-27 DIAGNOSIS — Z885 Allergy status to narcotic agent status: Secondary | ICD-10-CM | POA: Diagnosis not present

## 2015-11-27 DIAGNOSIS — Z833 Family history of diabetes mellitus: Secondary | ICD-10-CM

## 2015-11-27 DIAGNOSIS — Z720 Tobacco use: Secondary | ICD-10-CM | POA: Diagnosis not present

## 2015-11-27 DIAGNOSIS — E876 Hypokalemia: Secondary | ICD-10-CM | POA: Diagnosis present

## 2015-11-27 DIAGNOSIS — Z888 Allergy status to other drugs, medicaments and biological substances status: Secondary | ICD-10-CM

## 2015-11-27 DIAGNOSIS — I10 Essential (primary) hypertension: Secondary | ICD-10-CM

## 2015-11-27 DIAGNOSIS — J441 Chronic obstructive pulmonary disease with (acute) exacerbation: Secondary | ICD-10-CM | POA: Diagnosis present

## 2015-11-27 LAB — CBC WITH DIFFERENTIAL/PLATELET
BASOS PCT: 0 %
Basophils Absolute: 0 10*3/uL (ref 0–0.1)
Eosinophils Absolute: 0.1 10*3/uL (ref 0–0.7)
Eosinophils Relative: 2 %
HEMATOCRIT: 39.4 % (ref 35.0–47.0)
Hemoglobin: 13.7 g/dL (ref 12.0–16.0)
LYMPHS ABS: 0.7 10*3/uL — AB (ref 1.0–3.6)
Lymphocytes Relative: 16 %
MCH: 30.8 pg (ref 26.0–34.0)
MCHC: 34.7 g/dL (ref 32.0–36.0)
MCV: 88.7 fL (ref 80.0–100.0)
MONO ABS: 0.4 10*3/uL (ref 0.2–0.9)
MONOS PCT: 10 %
NEUTROS ABS: 3.1 10*3/uL (ref 1.4–6.5)
Neutrophils Relative %: 72 %
Platelets: 151 10*3/uL (ref 150–440)
RBC: 4.44 MIL/uL (ref 3.80–5.20)
RDW: 14.4 % (ref 11.5–14.5)
WBC: 4.3 10*3/uL (ref 3.6–11.0)

## 2015-11-27 LAB — BASIC METABOLIC PANEL
Anion gap: 9 (ref 5–15)
BUN: 18 mg/dL (ref 6–20)
CO2: 29 mmol/L (ref 22–32)
Calcium: 9.9 mg/dL (ref 8.9–10.3)
Chloride: 96 mmol/L — ABNORMAL LOW (ref 101–111)
Creatinine, Ser: 0.79 mg/dL (ref 0.44–1.00)
GFR calc Af Amer: >60 mL/min (ref >60)
GLUCOSE: 102 mg/dL — AB (ref 65–99)
POTASSIUM: 3.4 mmol/L — AB (ref 3.5–5.1)
Sodium: 134 mmol/L — ABNORMAL LOW (ref 135–145)

## 2015-11-27 LAB — TROPONIN I: Troponin I: <0.03 ng/mL (ref <0.031)

## 2015-11-27 MED ORDER — BENAZEPRIL HCL 20 MG PO TABS
40.0000 mg | ORAL_TABLET | Freq: Every day | ORAL | Status: DC
  Administered 2015-11-28 – 2015-11-29 (×2): 40 mg via ORAL
  Filled 2015-11-27 (×2): qty 2

## 2015-11-27 MED ORDER — PREDNISONE 10 MG PO TABS: 50.0000 mg | ORAL_TABLET | Freq: Every day | ORAL | Status: DC

## 2015-11-27 MED ORDER — HYDROCHLOROTHIAZIDE 25 MG PO TABS
25.0000 mg | ORAL_TABLET | Freq: Every day | ORAL | Status: DC
  Administered 2015-11-27 – 2015-11-29 (×3): 25 mg via ORAL
  Filled 2015-11-27 (×3): qty 1

## 2015-11-27 MED ORDER — ACETAMINOPHEN 650 MG RE SUPP: 650.0000 mg | Freq: Four times a day (QID) | RECTAL | Status: DC | PRN

## 2015-11-27 MED ORDER — ENOXAPARIN SODIUM 40 MG/0.4ML ~~LOC~~ SOLN
40.0000 mg | SUBCUTANEOUS | Status: DC
  Administered 2015-11-27 – 2015-11-28 (×2): 40 mg via SUBCUTANEOUS
  Filled 2015-11-27 (×2): qty 0.4

## 2015-11-27 MED ORDER — ONDANSETRON 4 MG PO TBDP
4.0000 mg | ORAL_TABLET | Freq: Three times a day (TID) | ORAL | Status: DC | PRN
  Filled 2015-11-27: qty 1

## 2015-11-27 MED ORDER — FERROUS SULFATE 325 (65 FE) MG PO TABS
325.0000 mg | ORAL_TABLET | Freq: Three times a day (TID) | ORAL | Status: DC
  Administered 2015-11-28 – 2015-11-29 (×4): 325 mg via ORAL
  Filled 2015-11-27 (×4): qty 1

## 2015-11-27 MED ORDER — AZITHROMYCIN 250 MG PO TABS
250.0000 mg | ORAL_TABLET | Freq: Every day | ORAL | Status: DC
  Administered 2015-11-28: 17:00:00 250 mg via ORAL
  Filled 2015-11-27: qty 1

## 2015-11-27 MED ORDER — ALBUTEROL SULFATE HFA 108 (90 BASE) MCG/ACT IN AERS: 2.0000 | INHALATION_SPRAY | RESPIRATORY_TRACT | Status: DC | PRN

## 2015-11-27 MED ORDER — AZITHROMYCIN 250 MG PO TABS
500.0000 mg | ORAL_TABLET | Freq: Every day | ORAL | Status: AC
  Administered 2015-11-27: 22:00:00 500 mg via ORAL
  Filled 2015-11-27: qty 2

## 2015-11-27 MED ORDER — SERTRALINE HCL 50 MG PO TABS
150.0000 mg | ORAL_TABLET | Freq: Every day | ORAL | Status: DC
  Administered 2015-11-27 – 2015-11-29 (×3): 150 mg via ORAL
  Filled 2015-11-27 (×4): qty 1

## 2015-11-27 MED ORDER — CALCIUM CARBONATE ANTACID 500 MG PO CHEW
1000.0000 mg | CHEWABLE_TABLET | Freq: Every day | ORAL | Status: DC
  Administered 2015-11-28 – 2015-11-29 (×2): 1000 mg via ORAL
  Filled 2015-11-27 (×2): qty 2

## 2015-11-27 MED ORDER — METHYLPREDNISOLONE SODIUM SUCC 125 MG IJ SOLR
60.0000 mg | Freq: Two times a day (BID) | INTRAMUSCULAR | Status: DC
  Administered 2015-11-27 – 2015-11-29 (×4): 60 mg via INTRAVENOUS
  Filled 2015-11-27 (×4): qty 2

## 2015-11-27 MED ORDER — IPRATROPIUM-ALBUTEROL 0.5-2.5 (3) MG/3ML IN SOLN
RESPIRATORY_TRACT | Status: AC
  Filled 2015-11-27: qty 3

## 2015-11-27 MED ORDER — NICOTINE 14 MG/24HR TD PT24
14.0000 mg | MEDICATED_PATCH | Freq: Every day | TRANSDERMAL | Status: DC
  Administered 2015-11-27 – 2015-11-29 (×3): 14 mg via TRANSDERMAL
  Filled 2015-11-27 (×3): qty 1

## 2015-11-27 MED ORDER — ROPINIROLE HCL 1 MG PO TABS
3.0000 mg | ORAL_TABLET | Freq: Four times a day (QID) | ORAL | Status: DC
  Administered 2015-11-27 – 2015-11-29 (×6): 3 mg via ORAL
  Filled 2015-11-27 (×10): qty 3

## 2015-11-27 MED ORDER — IPRATROPIUM-ALBUTEROL 0.5-2.5 (3) MG/3ML IN SOLN: 3.0000 mL | Freq: Four times a day (QID) | RESPIRATORY_TRACT | Status: DC

## 2015-11-27 MED ORDER — IPRATROPIUM-ALBUTEROL 0.5-2.5 (3) MG/3ML IN SOLN
3.0000 mL | Freq: Four times a day (QID) | RESPIRATORY_TRACT | Status: DC
Start: 2015-11-27 — End: 2015-11-29
  Administered 2015-11-27 – 2015-11-29 (×6): 3 mL via RESPIRATORY_TRACT
  Filled 2015-11-27 (×8): qty 3

## 2015-11-27 MED ORDER — ALBUTEROL SULFATE (2.5 MG/3ML) 0.083% IN NEBU: 2.5000 mg | INHALATION_SOLUTION | RESPIRATORY_TRACT | Status: DC | PRN

## 2015-11-27 MED ORDER — ACETAMINOPHEN 325 MG PO TABS: 650.0000 mg | ORAL_TABLET | Freq: Four times a day (QID) | ORAL | Status: DC | PRN

## 2015-11-27 MED ORDER — ONDANSETRON HCL 4 MG/2ML IJ SOLN
4.0000 mg | Freq: Four times a day (QID) | INTRAMUSCULAR | Status: DC | PRN
  Administered 2015-11-28: 18:00:00 4 mg via INTRAVENOUS
  Filled 2015-11-27: qty 2

## 2015-11-27 MED ORDER — IPRATROPIUM-ALBUTEROL 0.5-2.5 (3) MG/3ML IN SOLN
3.0000 mL | Freq: Once | RESPIRATORY_TRACT | Status: AC
  Administered 2015-11-27: 3 mL via RESPIRATORY_TRACT
  Filled 2015-11-27: qty 3

## 2015-11-27 MED ORDER — FLUTICASONE PROPIONATE 50 MCG/ACT NA SUSP: 1.0000 | Freq: Every day | NASAL | Status: DC | PRN

## 2015-11-27 MED ORDER — NAPROXEN 500 MG PO TABS
500.0000 mg | ORAL_TABLET | Freq: Two times a day (BID) | ORAL | Status: DC
  Administered 2015-11-28 – 2015-11-29 (×3): 500 mg via ORAL
  Filled 2015-11-27 (×5): qty 1

## 2015-11-27 MED ORDER — IPRATROPIUM-ALBUTEROL 0.5-2.5 (3) MG/3ML IN SOLN
3.0000 mL | Freq: Once | RESPIRATORY_TRACT | Status: AC
  Administered 2015-11-27: 3 mL via RESPIRATORY_TRACT

## 2015-11-27 MED ORDER — METOPROLOL SUCCINATE ER 50 MG PO TB24
50.0000 mg | ORAL_TABLET | Freq: Every day | ORAL | Status: DC
  Administered 2015-11-27 – 2015-11-29 (×3): 50 mg via ORAL
  Filled 2015-11-27 (×3): qty 1

## 2015-11-27 MED ORDER — METHYLPREDNISOLONE SODIUM SUCC 125 MG IJ SOLR
125.0000 mg | Freq: Once | INTRAMUSCULAR | Status: AC
  Administered 2015-11-27: 125 mg via INTRAVENOUS
  Filled 2015-11-27: qty 2

## 2015-11-27 MED ORDER — ONDANSETRON HCL 4 MG PO TABS: 4.0000 mg | ORAL_TABLET | Freq: Four times a day (QID) | ORAL | Status: DC | PRN

## 2015-11-27 MED ORDER — DOCUSATE SODIUM 100 MG PO CAPS
100.0000 mg | ORAL_CAPSULE | Freq: Two times a day (BID) | ORAL | Status: DC
Start: 2015-11-27 — End: 2015-11-29
  Administered 2015-11-28 – 2015-11-29 (×3): 100 mg via ORAL
  Filled 2015-11-27 (×3): qty 1

## 2015-11-27 NOTE — ED Notes (Signed)
Triage and assessment charting done by Jaci Carrel during computer down time. Done under another nurse log in. Patient complains of difficulty breathing and headache x 4 days.

## 2015-11-27 NOTE — ED Notes (Signed)
Patient resting comfortably. Awaiting bed assignment. No concerns or complaints at this time.

## 2015-11-27 NOTE — H&P (Signed)
[facility] NAME: Ann Silva    MR#:  [id number]  DATE OF BIRTH:  1945-10-06  DATE OF ADMISSION:  11/27/2015  PRIMARY CARE PHYSICIAN: Golden Pop, MD   REQUESTING/REFERRING PHYSICIAN: Dr. Reita Cliche  CHIEF COMPLAINT:   wheezing  HISTORY OF PRESENT ILLNESS:  Ann Silva  is a 70 y.o. female with a known history of COPD, restless leg syndrome and essential hypertension is presenting to the ED with a chief complaint of shortness of breath for the past 5 days. Shortness of breath has been getting worse and today  she has been wheezing diffusely which made her come to the hospital. Reporting cough but denies any sputum production. Denies any nausea vomiting chest pain or abdominal pain. Resting comfortably during my examination but still wheezing. Reports she is not allergic to Solu-Medrol  PAST MEDICAL HISTORY:   Past Medical History  Diagnosis Date  . 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 MSSA (methicillin susceptible Staphylococcus aureus) infection   . 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 Colon cancer ([place])   . Anemia   . Depression   . Hypertension   . Sleep apnea   . Atopic neurodermatitis   . Anxiety   . TIA (transient ischemic attack)   . COPD (chronic obstructive pulmonary disease) ([place])   . RLS (restless legs syndrome)     PAST SURGICAL HISTOIRY:   Past Surgical History  Procedure Laterality Date  . Total hip arthroplasty    . Cholecystectomy    . Colon surgery    . Abdominal hysterectomy    . Revision of total hip arthroplasty    . Resection total hip    . Joint replacement      SOCIAL HISTORY:   Social History  Substance Use Topics  . Smoking status: Current Every Day Smoker -- 0.50 packs/day    Types: Cigarettes    Start date: 07/08/1964  . Smokeless tobacco: Never Used  . Alcohol Use: No    FAMILY HISTORY:   Family History  Problem Relation Age of Onset  .  Diabetes Sister   . Mental illness Sister   . Cancer Mother   . Stroke Father     DRUG ALLERGIES:   Allergies  Allergen Reactions  . Hydrocodone-Chlorpheniramine Shortness Of Breath  . Prednisone Shortness Of Breath  . Chlorhexidine Rash  . Methadone Other (See Comments)    Reaction: Altered Mental Status  . Sulfa Antibiotics Other (See Comments)    Reaction: unknown  . Benadryl [Diphenhydramine] Anxiety  . Codeine Anxiety  . Diphenhydramine-Zinc Acetate Anxiety    REVIEW OF SYSTEMS:  CONSTITUTIONAL: No fever, fatigue or weakness.  EYES: No blurred or double vision.  EARS, NOSE, AND THROAT: No tinnitus or ear pain.  RESPIRATORY: Dry cough, shortness of breath, wheezing, denies hemoptysis.  CARDIOVASCULAR: No chest pain, orthopnea, edema.  GASTROINTESTINAL: No nausea, vomiting, diarrhea or abdominal pain.  GENITOURINARY: No dysuria, hematuria.  ENDOCRINE: No polyuria, nocturia,  HEMATOLOGY: No anemia, easy bruising or bleeding SKIN: No rash or lesion. MUSCULOSKELETAL: No joint pain or arthritis.   NEUROLOGIC: No tingling, numbness, weakness.  PSYCHIATRY: No anxiety or depression.   MEDICATIONS AT HOME:   Prior to Admission medications   Medication Sig Start Date End Date Taking? Authorizing Provider  albuterol (PROVENTIL HFA;VENTOLIN HFA) 108 (90 Base) MCG/ACT inhaler Inhale 2 puffs into the lungs every 4 (four) hours as needed for wheezing  or shortness of breath. 08/23/15  Yes Henreitta Leber, MD  benazepril (LOTENSIN) 40 MG tablet Take 1 tablet (40 mg total) by mouth daily. 04/20/15  Yes [name], MD  calcium carbonate (OS-CAL - DOSED IN MG OF ELEMENTAL CALCIUM) 1250 (500 Ca) MG tablet Take 1 tablet by mouth daily with breakfast.   Yes Historical Provider, MD  ferrous sulfate 325 (65 FE) MG tablet Take 325 mg by mouth 3 (three) times daily with meals.   Yes Historical Provider, MD  fluticasone (FLONASE) 50 MCG/ACT nasal spray Place 1 spray into both nostrils daily  as needed for rhinitis.    Yes Historical Provider, MD  hydrochlorothiazide (HYDRODIURIL) 25 MG tablet Take 1 tablet (25 mg total) by mouth daily. Patient taking differently: Take 25 mg by mouth at bedtime.  04/20/15  Yes [name], MD  ipratropium-albuterol (DUONEB) 0.5-2.5 (3) MG/3ML SOLN Take 3 mLs by nebulization every 6 (six) hours as needed. 08/23/15  Yes Henreitta Leber, MD  metoprolol succinate (TOPROL-XL) 50 MG 24 hr tablet Take 1 tablet (50 mg total) by mouth daily. 04/20/15  Yes [name], MD  naproxen (NAPROSYN) 500 MG tablet Take 1 tablet (500 mg total) by mouth 2 (two) times daily with a meal. 04/20/15  Yes [name], MD  ondansetron (ZOFRAN ODT) 4 MG disintegrating tablet Take 1 tablet (4 mg total) by mouth every 8 (eight) hours as needed for nausea or vomiting. 08/17/15  Yes Megan P Johnson, DO  rOPINIRole (REQUIP) 3 MG tablet Take 1 tablet (3 mg total) by mouth 4 (four) times daily. 04/20/15  Yes [name], MD  sertraline (ZOLOFT) 100 MG tablet Take 1.5 tablets (150 mg total) by mouth daily. Patient taking differently: Take 100 mg by mouth at bedtime.  04/20/15  Yes [name], MD  predniSONE (DELTASONE) 10 MG tablet Take 5 tablets (50 mg total) by mouth daily. 11/27/15   Lisa Roca, MD      VITAL SIGNS:  Blood pressure 95/65, pulse 60, temperature 98.9 F (37.2 C), temperature source Oral, resp. rate 26, height 5\' 2"  (1.575 m), weight 77.111 kg (170 lb), SpO2 96 %.  PHYSICAL EXAMINATION:  GENERAL:  70 y.o.-year-old patient lying in the bed with no acute distress.  EYES: Pupils equal, round, reactive to light and accommodation. No scleral icterus. Extraocular muscles intact.  HEENT: Head atraumatic, normocephalic. Oropharynx and nasopharynx clear.  NECK:  Supple, no jugular venous distention. No thyroid enlargement, no tenderness.  LUNGS: Moderate breath sounds bilaterally,   minimal diffuse wheezing,  No rales,rhonchi or crepitation. No use of  accessory muscles of respiration.  CARDIOVASCULAR: S1, S2 normal. No murmurs, rubs, or gallops.  ABDOMEN: Soft, nontender, nondistended. Bowel sounds present. No organomegaly or mass.  EXTREMITIES: No pedal edema, cyanosis, or clubbing.  NEUROLOGIC: Cranial nerves II through XII are intact. Muscle strength 5/5 in all extremities. Sensation intact. Gait not checked.  PSYCHIATRIC: The patient is alert and oriented x 3.  SKIN: No obvious rash, lesion, or ulcer.   LABORATORY PANEL:   CBC  Recent Labs Lab 11/27/15 1000  WBC 4.3  HGB 13.7  HCT 39.4  PLT 151   ------------------------------------------------------------------------------------------------------------------  Chemistries   Recent Labs Lab 11/27/15 1000  NA 134*  K 3.4*  CL 96*  CO2 29  GLUCOSE 102*  BUN 18  CREATININE 0.79  CALCIUM 9.9   ------------------------------------------------------------------------------------------------------------------  Cardiac Enzymes  Recent Labs Lab 11/27/15 1000  TROPONINI <0.03   ------------------------------------------------------------------------------------------------------------------  RADIOLOGY:  Dg Chest Port 1 View  11/27/2015  CLINICAL DATA:  Shortness of Breath EXAM: PORTABLE CHEST 1 VIEW COMPARISON:  08/21/2015 FINDINGS: Cardiomediastinal silhouette is stable. No acute infiltrate or pleural effusion. No pulmonary edema. Bony thorax is unremarkable. IMPRESSION: No active disease. Electronically Signed   By: Lahoma Crocker M.D.   On: 11/27/2015 10:11    EKG:   Orders placed or performed in visit on 11/27/15  . EKG 12-Lead    IMPRESSION AND PLAN:   Ann Silva  is a 70 y.o. female with a known history of COPD, restless leg syndrome and essential hypertension is presenting to the ED with a chief complaint of shortness of breath for the past 5 days. Shortness of breath has been getting worse and today  she has been wheezing diffusely which made her come to  the hospital. Reporting cough but denies any sputum production  #Acute COPD exacerbation Admit to MedSurg unit Provide IV Solu-Medrol, breathing treatments and azithromycin Antitussives as needed  #Essential hypertension Continue her home medications hydrochlorothiazide and Toprol-XL and titrate as needed  #Restless leg syndrome continue home medication Requip  #Tobacco abuse disorder Counseled patient to quit smoking for 4-5 minutes. Patient is willing to use nicotine patch. We will provide nicotine patch   Provide GI and DVT prophylaxis  All the records are reviewed and case discussed with ED provider. Management plans discussed with the patient, family and they are in agreement.  CODE STATUS: Full code, husband is the healthcare power of attorney  TOTAL TIME TAKING CARE OF THIS PATIENT: 40minutes.    Nicholes Mango M.D on 11/27/2015 at 4:17 PM  Between 7am to 6pm - Pager - [phone]  After 6pm go to [email or web address] - password [email or web address] [facility]  Office  [phone]  CC: Primary care physician; Golden Pop, MD

## 2015-11-27 NOTE — ED Provider Notes (Addendum)
[facility] Emergency Department Provider Note   ____________________________________________  Time seen:  I have reviewed the triage vital signs and the triage nursing note.  HISTORY  Chief Complaint Shortness of Breath   Historian Patient  HPI Ann Silva is a 70 y.o. female with a history of COPD, does not wear home oxygen, is here for wheezing and shortness of breath worsening over a few days. No chest pain or palpitations. No productive sputum. No fever. Mild headache for a few days. Decent by mouth intake. No abdominal pain. No confusion altered mental status. Symptoms are mild to moderate. She has been taking her albuterol at home.  States its been a few months since she took prednisone, but     Past Medical History  Diagnosis Date  . 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 MSSA (methicillin susceptible Staphylococcus aureus) infection   . 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 Colon cancer ([place])   . Anemia   . Depression   . Hypertension   . Sleep apnea   . Atopic neurodermatitis   . Anxiety   . TIA (transient ischemic attack)   . COPD (chronic obstructive pulmonary disease) ([place])   . RLS (restless legs syndrome)     Patient Active Problem List   Diagnosis Date Noted  . COPD exacerbation ([place]) 08/21/2015  . Restless legs 02/27/2015  . Hypertension   . Colon cancer ([place])   . Depression   . History of repair of hip joint 10/25/2013    Past Surgical History  Procedure Laterality Date  . Total hip arthroplasty    . Cholecystectomy    . Colon surgery    . Abdominal hysterectomy    . Revision of total hip arthroplasty    . Resection total hip    . Joint replacement      Current Outpatient Rx  Name  Route  Sig  Dispense  Refill  . albuterol (PROVENTIL HFA;VENTOLIN HFA) 108 (90 Base) MCG/ACT inhaler   Inhalation   Inhale 2 puffs into the lungs every 4 (four) hours as needed for wheezing or shortness of  breath.   1 Inhaler   2   . benazepril (LOTENSIN) 40 MG tablet   Oral   Take 1 tablet (40 mg total) by mouth daily.   90 tablet   4   . calcium carbonate (OS-CAL - DOSED IN MG OF ELEMENTAL CALCIUM) 1250 (500 Ca) MG tablet   Oral   Take 1 tablet by mouth daily with breakfast.         . ferrous sulfate 325 (65 FE) MG tablet   Oral   Take 325 mg by mouth 3 (three) times daily with meals.         . fluticasone (FLONASE) 50 MCG/ACT nasal spray   Each Nare   Place 1 spray into both nostrils daily as needed for rhinitis.          . hydrochlorothiazide (HYDRODIURIL) 25 MG tablet   Oral   Take 1 tablet (25 mg total) by mouth daily. Patient taking differently: Take 25 mg by mouth at bedtime.    90 tablet   4   . ipratropium-albuterol (DUONEB) 0.5-2.5 (3) MG/3ML SOLN   Nebulization   Take 3 mLs by nebulization every 6 (six) hours as needed.   360 mL   0   . metoprolol succinate (TOPROL-XL) 50 MG 24 hr tablet   Oral   Take  1 tablet (50 mg total) by mouth daily.   90 tablet   4   . naproxen (NAPROSYN) 500 MG tablet   Oral   Take 1 tablet (500 mg total) by mouth 2 (two) times daily with a meal.   180 tablet   1   . ondansetron (ZOFRAN ODT) 4 MG disintegrating tablet   Oral   Take 1 tablet (4 mg total) by mouth every 8 (eight) hours as needed for nausea or vomiting.   20 tablet   0   . rOPINIRole (REQUIP) 3 MG tablet   Oral   Take 1 tablet (3 mg total) by mouth 4 (four) times daily.   360 tablet   4   . sertraline (ZOLOFT) 100 MG tablet   Oral   Take 1.5 tablets (150 mg total) by mouth daily. Patient taking differently: Take 100 mg by mouth at bedtime.    135 tablet   4   . predniSONE (DELTASONE) 10 MG tablet   Oral   Take 5 tablets (50 mg total) by mouth daily.   20 tablet   0     Allergies Hydrocodone-chlorpheniramine; Prednisone; Chlorhexidine; Methadone; Sulfa antibiotics; Benadryl; Codeine; and Diphenhydramine-zinc acetate  Family History   Problem Relation Age of Onset  . Diabetes Sister   . Mental illness Sister   . Cancer Mother   . Stroke Father     Social History Social History  Substance Use Topics  . Smoking status: Current Every Day Smoker -- 0.50 packs/day    Types: Cigarettes    Start date: 07/08/1964  . Smokeless tobacco: Never Used  . Alcohol Use: No    Review of Systems  Constitutional: Negative for fever. Eyes: Negative for visual changes. ENT: Negative for sore throat. Cardiovascular: Negative for chest pain. Respiratory: Positive for shortness of breath. Gastrointestinal: Negative for abdominal pain, vomiting and diarrhea. Genitourinary: Negative for dysuria. Musculoskeletal: Negative for back pain. Skin: Negative for rash. Neurological: Positive for mild intermittent headaches. 10 point Review of Systems otherwise negative ____________________________________________   PHYSICAL EXAM:  VITAL SIGNS: ED Triage Vitals  Enc Vitals Group     BP 11/27/15 0943 105/58 mmHg     Pulse Rate 11/27/15 0943 76     Resp 11/27/15 0943 20     Temp 11/27/15 0943 98.9 F (37.2 C)     Temp Source 11/27/15 0943 Oral     SpO2 11/27/15 0943 98 %     Weight 11/27/15 0943 170 lb (77.111 kg)     Height 11/27/15 0943 5\' 2"  (1.575 m)     Head Cir --      Peak Flow --      Pain Score 11/27/15 1316 0     Pain Loc --      Pain Edu? --      Excl. in [place]? --      Constitutional: Alert and oriented. Well appearing and in no distress. HEENT   Head: Normocephalic and atraumatic.      Eyes: Conjunctivae are normal. PERRL. Normal extraocular movements.      Ears:         Nose: No congestion/rhinnorhea.   Mouth/Throat: Mucous membranes are moist.   Neck: No stridor. Cardiovascular/Chest: Normal rate, regular rhythm.  No murmurs, rubs, or gallops. Respiratory:Moderate wheezing and trouble breathing. Gastrointestinal: Soft. No distention, no guarding, no rebound. Nontender.     Genitourinary/rectal:Deferred Musculoskeletal: Nontender with normal range of motion in all extremities. No joint effusions.  No lower extremity  tenderness.  No edema. Neurologic:  Normal speech and language. No gross or focal neurologic deficits are appreciated. Skin:  Skin is warm, dry and intact. No rash noted. Psychiatric: Mood and affect are normal. Speech and behavior are normal. Patient exhibits appropriate insight and judgment.  ____________________________________________   EKG I, Lisa Roca, MD, the attending physician have personally viewed and interpreted all ECGs.  66 bpm. Normal sinus rhythm. Narrow QRS. Normal axis. Nonspecific T-wave ____________________________________________  LABS (pertinent positives/negatives)  Labs Reviewed  BASIC METABOLIC PANEL - Abnormal; Notable for the following:    Sodium 134 (*)    Potassium 3.4 (*)    Chloride 96 (*)    Glucose, Bld 102 (*)    All other components within normal limits  CBC WITH DIFFERENTIAL/PLATELET - Abnormal; Notable for the following:    Lymphs Abs 0.7 (*)    All other components within normal limits  TROPONIN I     ____________________________________________  RADIOLOGY All Xrays were viewed by me. Imaging interpreted by Radiologist.  Chest: No active disease __________________________________________  PROCEDURES  Procedure(s) performed: None  Critical Care performed: None  ____________________________________________   ED COURSE / ASSESSMENT AND PLAN  Pertinent labs & imaging results that were available during my care of the patient were reviewed by me and considered in my medical decision making (see chart for details).   Patient endorsed symptoms of likely COPD exacerbation clinically.  She is wheezing and improved after DuoNeb treatment. I will discharge her with prednisone. Her chart noted allergy to prednisone, the patient states she does not have an allergy to prednisone and has  taken in the past.  I will try to see if anything at the nurse to take this out of her allergy list.  No suspicious of acute coronary syndrome clinically. I'm not suspicious for PE.  She was complaining of mild intermittent headaches, but no high risk/red flag features for intracrainial emergency.  If patient is able to walk without hypoxia/clinically comfortable, I will discharge her home today.  CONSULTATIONS:  Hospitalist for admission.   Patient / Family / Caregiver informed of clinical course, medical decision-making process, and agree with plan.  Addended to include walking O2 sat on room air, 83 to 87%. Patient will be admitted to the hospitalist.   ___________________________________________   FINAL CLINICAL IMPRESSION(S) / ED DIAGNOSES   Final diagnoses:  COPD exacerbation ([place])              Note: This dictation was prepared with Dragon dictation. Any transcriptional errors that result from this process are unintentional   Lisa Roca, MD 11/27/15 [place], MD 11/27/15 [phone]

## 2015-11-27 NOTE — ED Notes (Signed)
Patient placed on 2L 02 per MD.

## 2015-11-27 NOTE — ED Notes (Signed)
Patient ambulated around nurses station. 02 sats between 83 and 87 percent while ambulating on room air. Patient put back in bed and on monitor. Will make MD aware.

## 2015-11-27 NOTE — ED Notes (Signed)
Pt assisted to bathroom

## 2015-11-27 NOTE — ED Notes (Signed)
Pt given snack that consisted of graham crackers,peanut butter and diet coke. Pt waiting for room to be ready for admission.

## 2015-11-28 LAB — CBC
HCT: 39.3 % (ref 35.0–47.0)
Hemoglobin: 13.5 g/dL (ref 12.0–16.0)
MCH: 31.1 pg (ref 26.0–34.0)
MCHC: 34.4 g/dL (ref 32.0–36.0)
MCV: 90.3 fL (ref 80.0–100.0)
PLATELETS: 128 10*3/uL — AB (ref 150–440)
RBC: 4.35 MIL/uL (ref 3.80–5.20)
RDW: 14.3 % (ref 11.5–14.5)
WBC: 2 10*3/uL — AB (ref 3.6–11.0)

## 2015-11-28 LAB — GLUCOSE, CAPILLARY
GLUCOSE-CAPILLARY: 154 mg/dL — AB (ref 65–99)
Glucose-Capillary: 133 mg/dL — ABNORMAL HIGH (ref 65–99)
Glucose-Capillary: 166 mg/dL — ABNORMAL HIGH (ref 65–99)

## 2015-11-28 LAB — BASIC METABOLIC PANEL
Anion gap: 9 (ref 5–15)
BUN: 18 mg/dL (ref 6–20)
CALCIUM: 9.9 mg/dL (ref 8.9–10.3)
CO2: 30 mmol/L (ref 22–32)
CREATININE: 0.7 mg/dL (ref 0.44–1.00)
Chloride: 98 mmol/L — ABNORMAL LOW (ref 101–111)
Glucose, Bld: 279 mg/dL — ABNORMAL HIGH (ref 65–99)
Potassium: 2.7 mmol/L — CL (ref 3.5–5.1)
SODIUM: 137 mmol/L (ref 135–145)

## 2015-11-28 LAB — HEMOGLOBIN A1C: Hgb A1c MFr Bld: 5.5 % (ref 4.0–6.0)

## 2015-11-28 LAB — MAGNESIUM: MAGNESIUM: 1.6 mg/dL — AB (ref 1.7–2.4)

## 2015-11-28 LAB — POTASSIUM: POTASSIUM: 3.5 mmol/L (ref 3.5–5.1)

## 2015-11-28 MED ORDER — INSULIN ASPART 100 UNIT/ML ~~LOC~~ SOLN: 0.0000 [IU] | Freq: Every day | SUBCUTANEOUS | Status: DC

## 2015-11-28 MED ORDER — MAGNESIUM SULFATE 2 GM/50ML IV SOLN
2.0000 g | Freq: Once | INTRAVENOUS | Status: AC
  Administered 2015-11-28: 2 g via INTRAVENOUS
  Filled 2015-11-28: qty 50

## 2015-11-28 MED ORDER — POTASSIUM CHLORIDE CRYS ER 20 MEQ PO TBCR
40.0000 meq | EXTENDED_RELEASE_TABLET | ORAL | Status: AC
  Administered 2015-11-28 (×2): 40 meq via ORAL
  Filled 2015-11-28 (×2): qty 2

## 2015-11-28 MED ORDER — ALPRAZOLAM 0.25 MG PO TABS
0.2500 mg | ORAL_TABLET | Freq: Four times a day (QID) | ORAL | Status: DC | PRN
  Administered 2015-11-28: 0.25 mg via ORAL
  Filled 2015-11-28: qty 1

## 2015-11-28 MED ORDER — POTASSIUM CHLORIDE 20 MEQ PO PACK
20.0000 meq | PACK | Freq: Once | ORAL | Status: AC
  Administered 2015-11-28: 20 meq via ORAL
  Filled 2015-11-28: qty 1

## 2015-11-28 MED ORDER — INSULIN ASPART 100 UNIT/ML ~~LOC~~ SOLN
0.0000 [IU] | Freq: Three times a day (TID) | SUBCUTANEOUS | Status: DC
  Administered 2015-11-28: 2 [IU] via SUBCUTANEOUS
  Administered 2015-11-28: 3 [IU] via SUBCUTANEOUS
  Administered 2015-11-29: 2 [IU] via SUBCUTANEOUS
  Filled 2015-11-28: qty 2
  Filled 2015-11-28: qty 3
  Filled 2015-11-28: qty 2

## 2015-11-28 NOTE — Progress Notes (Signed)
[place] [facility] [place] NAME: Ann Silva    MR#:  [id number]  DATE OF BIRTH:  May 04, 1946  SUBJECTIVE:  CHIEF COMPLAINT:   Chief Complaint  Patient presents with  . Shortness of Breath   Cough, SOB and wheezing, on O2 [place] 2 L. REVIEW OF SYSTEMS:  CONSTITUTIONAL: No fever, fatigue or weakness.  EYES: No blurred or double vision.  EARS, NOSE, AND THROAT: No tinnitus or ear pain.  RESPIRATORY: has cough, shortness of breath, wheezing but no hemoptysis.  CARDIOVASCULAR: No chest pain, orthopnea, edema.  GASTROINTESTINAL: No nausea, vomiting, diarrhea or abdominal pain.  GENITOURINARY: No dysuria, hematuria.  ENDOCRINE: No polyuria, nocturia,  HEMATOLOGY: No anemia, easy bruising or bleeding SKIN: No rash or lesion. MUSCULOSKELETAL: No joint pain or arthritis.   NEUROLOGIC: No tingling, numbness, weakness.  PSYCHIATRY: No anxiety or depression.   DRUG ALLERGIES:   Allergies  Allergen Reactions  . Hydrocodone-Chlorpheniramine Shortness Of Breath  . Prednisone Shortness Of Breath  . Chlorhexidine Rash  . Methadone Other (See Comments)    Reaction: Altered Mental Status  . Sulfa Antibiotics Other (See Comments)    Reaction:  Unknown   . Benadryl [Diphenhydramine] Anxiety  . Codeine Anxiety  . Diphenhydramine-Zinc Acetate Anxiety    VITALS:  Blood pressure 127/68, pulse 71, temperature 98.1 F (36.7 C), temperature source Oral, resp. rate 20, height 5\' 2"  (1.575 m), weight 78.699 kg (173 lb 8 oz), SpO2 97 %.  PHYSICAL EXAMINATION:  GENERAL:  70 y.o.-year-old patient lying in the bed with no acute distress.  EYES: Pupils equal, round, reactive to light and accommodation. No scleral icterus. Extraocular muscles intact.  HEENT: Head atraumatic, normocephalic. Oropharynx and nasopharynx clear.  NECK:  Supple, no jugular venous distention. No thyroid enlargement, no tenderness.  LUNGS: Normal breath sounds bilaterally, bilateral  expiratory wheezing, no rales,rhonchi or crepitation. No use of accessory muscles of respiration.  CARDIOVASCULAR: S1, S2 normal. No murmurs, rubs, or gallops.  ABDOMEN: Soft, nontender, nondistended. Bowel sounds present. No organomegaly or mass.  EXTREMITIES: No pedal edema, cyanosis, or clubbing.  NEUROLOGIC: Cranial nerves II through XII are intact. Muscle strength 5/5 in all extremities. Sensation intact. Gait not checked.  PSYCHIATRIC: The patient is alert and oriented x 3.  SKIN: No obvious rash, lesion, or ulcer.    LABORATORY PANEL:   CBC  Recent Labs Lab 11/28/15 0352  WBC 2.0*  HGB 13.5  HCT 39.3  PLT 128*   ------------------------------------------------------------------------------------------------------------------  Chemistries   Recent Labs Lab 11/28/15 0352  NA 137  K 2.7*  CL 98*  CO2 30  GLUCOSE 279*  BUN 18  CREATININE 0.70  CALCIUM 9.9  MG 1.6*   ------------------------------------------------------------------------------------------------------------------  Cardiac Enzymes  Recent Labs Lab 11/27/15 1000  TROPONINI <0.03   ------------------------------------------------------------------------------------------------------------------  RADIOLOGY:  Dg Chest Port 1 View  11/27/2015  CLINICAL DATA:  Shortness of Breath EXAM: PORTABLE CHEST 1 VIEW COMPARISON:  08/21/2015 FINDINGS: Cardiomediastinal silhouette is stable. No acute infiltrate or pleural effusion. No pulmonary edema. Bony thorax is unremarkable. IMPRESSION: No active disease. Electronically Signed   By: Lahoma Crocker M.D.   On: 11/27/2015 10:11    EKG:   Orders placed or performed in visit on 11/27/15  . EKG 12-Lead  . EKG 12-Lead  . EKG 12-Lead    ASSESSMENT AND PLAN:   Ann Silva is a 70 y.o. female with a known history of COPD, restless leg syndrome and essential hypertension is presenting to the  ED with a chief complaint of shortness of breath for the past 5 days.  Shortness of breath has been getting worse and today she has been wheezing diffusely which made her come to the hospital. Reporting cough but denies any sputum production  #Acute COPD exacerbation Taper  IV Solu-Medrol,  Continue NEB and azithromycin Antitussives as needed  #Essential hypertension Continue her home medications hydrochlorothiazide and Toprol-XL and titrate as needed  #Restless leg syndrome continue home medication Requip  #Tobacco abuse disorder Counseled patient to quit smoking, nicotine patch  Leukopenia. F/u CBC. Hypokalemia. KCl supplement, f/u BMP. Hypomagnesemia. IV mag, f/u level.  All the records are reviewed and case discussed with Care Management/Social Workerr. Management plans discussed with the patient, family and they are in agreement.  CODE STATUS: full code.  TOTAL TIME TAKING CARE OF THIS PATIENT: 38 minutes.  Greater than 50% time was spent on coordination of care and face-to-face counseling.  POSSIBLE D/C IN 2 DAYS, DEPENDING ON CLINICAL CONDITION.   Demetrios Loll M.D on 11/28/2015 at 3:19 PM  Between 7am to 6pm - Pager - [phone]  After 6pm go to [email or web address] - password [email or web address] [facility]  Office  [phone]  CC: Primary care physician; Golden Pop, MD

## 2015-11-28 NOTE — Progress Notes (Signed)
PARENTERAL CONSULT NOTE - FOLLOW UP  Pharmacy Consult for Electrolytes Indication: hypokalemia/hypomagnesia  Allergies  Allergen Reactions  . Hydrocodone-Chlorpheniramine Shortness Of Breath  . Prednisone Shortness Of Breath  . Chlorhexidine Rash  . Methadone Other (See Comments)    Reaction: Altered Mental Status  . Sulfa Antibiotics Other (See Comments)    Reaction:  Unknown   . Benadryl [Diphenhydramine] Anxiety  . Codeine Anxiety  . Diphenhydramine-Zinc Acetate Anxiety    Patient Measurements: Height: 5\' 2"  (157.5 cm) Weight: 173 lb 8 oz (78.699 kg) IBW/kg (Calculated) : 50.1 Adjusted Body Weight:  Usual Weight:  Vital Signs: Temp: 98.1 F (36.7 C) (05/23 1457) Temp Source: Oral (05/23 1457) BP: 127/68 mmHg (05/23 1457) Pulse Rate: 71 (05/23 1457) Intake/Output from previous day:   Intake/Output from this shift: Total I/O In: 360 [P.O.:360] Out: -   Labs:  Recent Labs  11/27/15 1000 11/28/15 0352  WBC 4.3 2.0*  HGB 13.7 13.5  HCT 39.4 39.3  PLT 151 128*     Recent Labs  11/27/15 1000 11/28/15 0352 11/28/15 1552  NA 134* 137  --   K 3.4* 2.7* 3.5  CL 96* 98*  --   CO2 29 30  --   GLUCOSE 102* 279*  --   BUN 18 18  --   CREATININE 0.79 0.70  --   CALCIUM 9.9 9.9  --   MG  --  1.6*  --    Estimated Creatinine Clearance: 62.6 mL/min (by C-G formula based on Cr of 0.7).    Recent Labs  11/28/15 1204 11/28/15 1629  GLUCAP 154* 133*    Medical History: Past Medical History  Diagnosis Date  . 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 MSSA (methicillin susceptible Staphylococcus aureus) infection   . 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 Colon cancer ([place])   . Anemia   . Depression   . Hypertension   . Sleep apnea   . Atopic neurodermatitis   . Anxiety   . TIA (transient ischemic attack)   . COPD (chronic obstructive pulmonary disease) ([place])   . RLS (restless legs syndrome)     Medications:  Scheduled:  .  azithromycin  250 mg Oral Daily  . benazepril  40 mg Oral Daily  . calcium carbonate  1,000 mg Oral Q breakfast  . docusate sodium  100 mg Oral BID  . enoxaparin (LOVENOX) injection  40 mg Subcutaneous Q24H  . ferrous sulfate  325 mg Oral TID WC  . hydrochlorothiazide  25 mg Oral Daily  . insulin aspart  0-15 Units Subcutaneous TID WC  . insulin aspart  0-5 Units Subcutaneous QHS  . ipratropium-albuterol  3 mL Nebulization Q6H  . methylPREDNISolone (SOLU-MEDROL) injection  60 mg Intravenous Q12H  . metoprolol succinate  50 mg Oral Daily  . naproxen  500 mg Oral BID WC  . nicotine  14 mg Transdermal Daily  . potassium chloride  20 mEq Oral Once  . rOPINIRole  3 mg Oral QID  . sertraline  150 mg Oral Daily   Infusions:     Assessment: 70 yo F with hypokalemia/hypomagnesia  Plan:  Follow up potassium this evening after repletion was 3.5. Will order an additional KCl 20 mEq PO x 1 dose.  Electrolyte labs ordered for tomorrow AM.  Darylene Price [facility] 11/28/2015,5:24 PM

## 2015-11-28 NOTE — Progress Notes (Signed)
PARENTERAL CONSULT NOTE - INITIAL  Pharmacy Consult for Electrolytes Indication: hypokalemia/hypomagnesia  Allergies  Allergen Reactions  . Hydrocodone-Chlorpheniramine Shortness Of Breath  . Prednisone Shortness Of Breath  . Chlorhexidine Rash  . Methadone Other (See Comments)    Reaction: Altered Mental Status  . Sulfa Antibiotics Other (See Comments)    Reaction:  Unknown   . Benadryl [Diphenhydramine] Anxiety  . Codeine Anxiety  . Diphenhydramine-Zinc Acetate Anxiety    Patient Measurements: Height: 5\' 2"  (157.5 cm) Weight: 173 lb 8 oz (78.699 kg) IBW/kg (Calculated) : 50.1 Adjusted Body Weight:  Usual Weight:  Vital Signs: Temp: 98.1 F (36.7 C) (05/23 0526) Temp Source: Oral (05/23 0526) BP: 134/71 mmHg (05/23 0526) Pulse Rate: 73 (05/23 0526) Intake/Output from previous day:   Intake/Output from this shift: Total I/O In: 240 [P.O.:240] Out: -   Labs:  Recent Labs  11/27/15 1000 11/28/15 0352  WBC 4.3 2.0*  HGB 13.7 13.5  HCT 39.4 39.3  PLT 151 128*     Recent Labs  11/27/15 1000 11/28/15 0352  NA 134* 137  K 3.4* 2.7*  CL 96* 98*  CO2 29 30  GLUCOSE 102* 279*  BUN 18 18  CREATININE 0.79 0.70  CALCIUM 9.9 9.9  MG  --  1.6*   Estimated Creatinine Clearance: 62.6 mL/min (by C-G formula based on Cr of 0.7).    Recent Labs  11/28/15 1204  GLUCAP 154*    Medical History: Past Medical History  Diagnosis Date  . 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 MSSA (methicillin susceptible Staphylococcus aureus) infection   . 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 Colon cancer ([place])   . Anemia   . Depression   . Hypertension   . Sleep apnea   . Atopic neurodermatitis   . Anxiety   . TIA (transient ischemic attack)   . COPD (chronic obstructive pulmonary disease) ([place])   . RLS (restless legs syndrome)     Medications:  Scheduled:  . azithromycin  250 mg Oral Daily  . benazepril  40 mg Oral Daily  . calcium  carbonate  1,000 mg Oral Q breakfast  . docusate sodium  100 mg Oral BID  . enoxaparin (LOVENOX) injection  40 mg Subcutaneous Q24H  . ferrous sulfate  325 mg Oral TID WC  . hydrochlorothiazide  25 mg Oral Daily  . insulin aspart  0-15 Units Subcutaneous TID WC  . insulin aspart  0-5 Units Subcutaneous QHS  . ipratropium-albuterol  3 mL Nebulization Q6H  . magnesium sulfate 1 - 4 g bolus IVPB  2 g Intravenous Once  . methylPREDNISolone (SOLU-MEDROL) injection  60 mg Intravenous Q12H  . metoprolol succinate  50 mg Oral Daily  . naproxen  500 mg Oral BID WC  . nicotine  14 mg Transdermal Daily  . rOPINIRole  3 mg Oral QID  . sertraline  150 mg Oral Daily   Infusions:     Assessment: 70 yo F with hypokalemia/hypomagnesia  Plan:  K= 2.7.  MD ordered KCL po 23meq q4h x 2 doses. Will recheck at 1600. Mag= 1.6  MD ordered Magnesium sulfate 2 gram IV x 1. Will recheck in am.  Annjanette Wertenberger A 11/28/2015,12:10 PM

## 2015-11-28 NOTE — Progress Notes (Signed)
PT Cancellation Note  Patient Details Name: Ann Silva MRN: [id number] DOB: 01-19-1946   Cancelled Treatment:    Reason Eval/Treat Not Completed: Medical issues which prohibited therapy. Pt's chart reviewed. Currently pt's potassium is 2.7 which is contraindicated to participate in therapy. Per pharmacy note, pt is planned to get electrolyte replacement and recheck at 1600. PT will f/u and complete evaluation when appropriate.   Neoma Laming, PT, DPT  11/28/2015, 3:11 PM [phone]

## 2015-11-28 NOTE — Care Management (Signed)
Admitted to this facility with the diagnosis of COPD. Lives with husband, Juanda Crumble [phone]). Last seen Dr. Jeananne Rama a month ago. Cane and rolling walker in the home, if needed. [facility] x 3 in the past for rehabilitation. [facility] in the past for home health, physical therapy, and occupational therapy about 2 years ago.  Would like [facility] again, if possible.  No home oxygen. Good appetite. No seizures. Takes care of all basic and instrumental activities of daily living herself, drives. Husband will transport.  Shelbie Ammons RN MSN CCM Care Management [phone]

## 2015-11-28 NOTE — Progress Notes (Signed)
Inpatient Diabetes Program Recommendations  AACE/ADA: New Consensus Statement on Inpatient Glycemic Control (2015)  Target Ranges:  Prepandial:   less than 140 mg/dL      Peak postprandial:   less than 180 mg/dL (1-2 hours)      Critically ill patients:  140 - 180 mg/dL   Review of Glycemic Control:  Results for COTRINA, DANZEY (MRN [id number]) as of 11/28/2015 09:21  Ref. Range 11/27/2015 10:00 11/28/2015 03:52  Glucose Latest Ref Range: 65-99 mg/dL 102 (H) 279 (H)   Diabetes history:   No history of diabetes Outpatient Diabetes medications: None Current orders for Inpatient glycemic control:  Solumedrol 60 mg IV q 12 hours  Inpatient Diabetes Program Recommendations:    Note that patient is on IV steroids.  Please consider checking CBG's tid with meals and HS and add Novolog correction moderate tid with meals.  Thanks, Adah Perl, RN, BC-ADM Inpatient Diabetes Coordinator Pager [phone] (8a-5p)

## 2015-11-28 NOTE — Progress Notes (Signed)
Initial Nutrition Assessment  DOCUMENTATION CODES:   Not applicable  INTERVENTION:   Cater to pt preferences on 2g Na diet order to optimize nutritional intake Will recommend on follow if intake inadequate   NUTRITION DIAGNOSIS:   Inadequate oral intake related to acute illness as evidenced by per patient/family report.  GOAL:   Patient will meet greater than or equal to 90% of their needs  MONITOR:   PO intake, Skin, Weight trends, Labs, I & O's  REASON FOR ASSESSMENT:   Malnutrition Screening Tool    ASSESSMENT:   Pt admitted with COPD exacerbation.   Past Medical History  Diagnosis Date  . 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 MSSA (methicillin susceptible Staphylococcus aureus) infection   . 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 Colon cancer ([place])   . Anemia   . Depression   . Hypertension   . Sleep apnea   . Atopic neurodermatitis   . Anxiety   . TIA (transient ischemic attack)   . COPD (chronic obstructive pulmonary disease) ([place])   . RLS (restless legs syndrome)     Diet Order:  Diet 2 gram sodium Room service appropriate?: Yes; Fluid consistency:: Thin   Pt reports eating some breakfast this am as well as about half of biscuit brought in by family from [place] at bedside. Pt reports family brought her lasagna last night in the ED that she ate.  Pt reports she started having less appetite this past Thursday. Pt reports not having much of an appetite at all and that when she did eat something, there were times that she threw it all back up.   Medications: Calcium carbonate, SS novolog, Colace, MgS, Solumedrol, Ferrous Sulfate  Labs: K 2.7 (supplemented), Glucose 279, Mg 1.6 (supplemented)   Gastrointestinal Profile: Last BM:  11/27/2015    Nutrition-Focused Physical Exam Findings: Nutrition-Focused physical exam completed. Findings are WDL for fat depletion, muscle depletion, and edema.    Weight Change: Pt reports  weight loss of 5lbs since this past Thursday when she was admitted and weighed 173lbs, 2% weight loss in <1 week   Skin:  Reviewed, no issues   Height:   Ht Readings from Last 1 Encounters:  11/27/15 5\' 2"  (1.575 m)    Weight:   Wt Readings from Last 1 Encounters:  11/27/15 173 lb 8 oz (78.699 kg)   Wt Readings from Last 10 Encounters:  11/27/15 173 lb 8 oz (78.699 kg)  11/15/15 181 lb (82.1 kg)  10/24/15 179 lb (81.194 kg)  08/28/15 180 lb (81.647 kg)  08/22/15 172 lb 9.6 oz (78.291 kg)  08/17/15 177 lb (80.287 kg)  08/14/15 174 lb (78.926 kg)  07/20/15 177 lb (80.287 kg)  04/20/15 176 lb (79.833 kg)  03/14/15 173 lb (78.472 kg)    Ideal Body Weight:   50kg  BMI:  Body mass index is 31.73 kg/(m^2).  Estimated Nutritional Needs:   Kcal:  1975-2300kcals  Protein:  79-95g protein  Fluid:  >2L fluid  EDUCATION NEEDS:   No education needs identified at this time  Dwyane Luo, RD, LDN Pager [phone] Weekend/On-Call Pager [phone]

## 2015-11-29 LAB — CBC
HEMATOCRIT: 41.5 % (ref 35.0–47.0)
HEMOGLOBIN: 13.9 g/dL (ref 12.0–16.0)
MCH: 30.5 pg (ref 26.0–34.0)
MCHC: 33.5 g/dL (ref 32.0–36.0)
MCV: 91.2 fL (ref 80.0–100.0)
Platelets: 163 10*3/uL (ref 150–440)
RBC: 4.55 MIL/uL (ref 3.80–5.20)
RDW: 14.3 % (ref 11.5–14.5)
WBC: 9.8 10*3/uL (ref 3.6–11.0)

## 2015-11-29 LAB — BASIC METABOLIC PANEL
ANION GAP: 11 (ref 5–15)
BUN: 37 mg/dL — ABNORMAL HIGH (ref 6–20)
CALCIUM: 10.2 mg/dL (ref 8.9–10.3)
CO2: 30 mmol/L (ref 22–32)
Chloride: 98 mmol/L — ABNORMAL LOW (ref 101–111)
Creatinine, Ser: 0.73 mg/dL (ref 0.44–1.00)
Glucose, Bld: 135 mg/dL — ABNORMAL HIGH (ref 65–99)
POTASSIUM: 3.8 mmol/L (ref 3.5–5.1)
SODIUM: 139 mmol/L (ref 135–145)

## 2015-11-29 LAB — GLUCOSE, CAPILLARY: GLUCOSE-CAPILLARY: 121 mg/dL — AB (ref 65–99)

## 2015-11-29 LAB — MAGNESIUM: MAGNESIUM: 2 mg/dL (ref 1.7–2.4)

## 2015-11-29 MED ORDER — CALCIUM CARBONATE 1250 (500 CA) MG PO TABS: 1.0000 | ORAL_TABLET | Freq: Every day | ORAL | Status: DC

## 2015-11-29 MED ORDER — METOPROLOL SUCCINATE ER 50 MG PO TB24: 50.0000 mg | ORAL_TABLET | Freq: Every day | ORAL | Status: DC

## 2015-11-29 MED ORDER — PREDNISONE 10 MG PO TABS: ORAL_TABLET | ORAL | Status: DC

## 2015-11-29 MED ORDER — IPRATROPIUM-ALBUTEROL 0.5-2.5 (3) MG/3ML IN SOLN: 3.0000 mL | Freq: Four times a day (QID) | RESPIRATORY_TRACT | Status: DC | PRN

## 2015-11-29 MED ORDER — HYDROCHLOROTHIAZIDE 25 MG PO TABS: 25.0000 mg | ORAL_TABLET | Freq: Every day | ORAL | Status: DC

## 2015-11-29 MED ORDER — SERTRALINE HCL 100 MG PO TABS: 150.0000 mg | ORAL_TABLET | Freq: Every day | ORAL | Status: DC

## 2015-11-29 NOTE — Evaluation (Signed)
Physical Therapy Evaluation Patient Details Name: Ann Silva MRN: [id number] DOB: [birth year] Today's Date: 11/29/2015   History of Present Illness  70 yo F presented to ED on 5/22 for SOB and headache found to have COPD exacerbation. PMH includes MSSA, aseptic necrosis of R hip, colon cancer, TIA and COPD.  Clinical Impression  Pt demonstrated generalized weakness and difficulty walking due to decreased endurance with COPD exacerbation. She is I with bed mobility and transfers. Pt mod I for ambulation up to 250 ft at the time, requiring increased time to complete due to need for therapeutic rest breaks for energy conservation. On RA, she ranged 90 to 95% during activity. Frequent cues for breathing technique. Pt educated on a walking program at home for a total up to 30 minutes per day to improve endurance. HHPT recommended after hospital discharge to address deficits of strength, endurance and ambulation to progress towards PLOF. Pt will benefit from skilled PT services to increase functional I and mobility for safe discharge.     Follow Up Recommendations Home health PT    Equipment Recommendations  None recommended by PT    Recommendations for Other Services       Precautions / Restrictions Precautions Precautions: None Restrictions Weight Bearing Restrictions: No      Mobility  Bed Mobility Overal bed mobility: Independent                Transfers Overall transfer level: Independent Equipment used: None             General transfer comment: good safety  Ambulation/Gait Ambulation/Gait assistance: Modified independent (Device/Increase time) Ambulation Distance (Feet): 250 Feet Assistive device: None Gait Pattern/deviations: Decreased stride length;Wide base of support Gait velocity: reduced Gait velocity interpretation: Below normal speed for age/gender General Gait Details: Demonstrated steady but slow gait with no LOB or buckling. Becomes SOB/fatigued  quickly requiring standing rest breaks for energy conservation.  Stairs Stairs: Yes Stairs assistance: Supervision Stair Management: One rail Right Number of Stairs: 6 General stair comments: steady, one step at the time for energy conservation  Wheelchair Mobility    Modified Rankin (Stroke Patients Only)       Balance Overall balance assessment: No apparent balance deficits (not formally assessed)                                           Pertinent Vitals/Pain Pain Assessment: No/denies pain    Home Living Family/patient expects to be discharged to:: Private residence Living Arrangements: Spouse/significant other Available Help at Discharge: Family Type of Home: House Home Access: Stairs to enter Entrance Stairs-Rails: Right Entrance Stairs-Number of Steps: 2 Home Layout: Two level;Able to live on main level with bedroom/bathroom Home Equipment: Gilford Rile - 2 wheels;Cane - single point Additional Comments: "I have everything"    Prior Function Level of Independence: Independent         Comments: Pt I with limited community ambulation without AD. Limited by fatigue.     Hand Dominance        Extremity/Trunk Assessment   Upper Extremity Assessment: Overall WFL for tasks assessed           Lower Extremity Assessment: Generalized weakness (grossly 4/5)         Communication   Communication: No difficulties  Cognition Arousal/Alertness: Awake/alert Behavior During Therapy: WFL for tasks assessed/performed Overall Cognitive Status: Within Functional Limits for  tasks assessed                      General Comments      Exercises Other Exercises Other Exercises: Pt ambulated ~ 250 ft x 4 with standing rest breaks frequently for energy conservation. SpO2 desat to 89 to 90% on RA and increased quickly with pursed lip breathing to 95%.      Assessment/Plan    PT Assessment Patient needs continued PT services  PT Diagnosis  Difficulty walking;Generalized weakness   PT Problem List Decreased strength;Decreased activity tolerance;Cardiopulmonary status limiting activity  PT Treatment Interventions Gait training;Stair training;Therapeutic activities;Therapeutic exercise;Balance training;Neuromuscular re-education;Patient/family education   PT Goals (Current goals can be found in the Care Plan section) Acute Rehab PT Goals Patient Stated Goal: to get stronger PT Goal Formulation: With patient Time For Goal Achievement: 12/13/15 Potential to Achieve Goals: Good    Frequency Min 2X/week   Barriers to discharge   none    Co-evaluation               End of Session Equipment Utilized During Treatment: Gait belt Activity Tolerance: Patient tolerated treatment well Patient left: in bed;with call bell/phone within reach Nurse Communication: Mobility status         Time: [id number] PT Time Calculation (min) (ACUTE ONLY): 23 min   Charges:   PT Evaluation $PT Eval Low Complexity: 1 Procedure PT Treatments $Gait Training: 8-22 mins   PT G Codes:        Neoma Laming, PT, DPT  11/29/2015, 10:23 AM [phone]

## 2015-11-29 NOTE — Progress Notes (Signed)
PARENTERAL CONSULT NOTE - FOLLOW UP  Pharmacy Consult for Electrolytes Indication: hypokalemia/hypomagnesia  Allergies  Allergen Reactions  . Hydrocodone-Chlorpheniramine Shortness Of Breath  . Prednisone Shortness Of Breath  . Chlorhexidine Rash  . Methadone Other (See Comments)    Reaction: Altered Mental Status  . Sulfa Antibiotics Other (See Comments)    Reaction:  Unknown   . Benadryl [Diphenhydramine] Anxiety  . Codeine Anxiety  . Diphenhydramine-Zinc Acetate Anxiety    Patient Measurements: Height: 5\' 2"  (157.5 cm) Weight: 173 lb 8 oz (78.699 kg) IBW/kg (Calculated) : 50.1 Adjusted Body Weight:  Usual Weight:  Vital Signs: Temp: 97.3 F (36.3 C) (05/24 0941) Temp Source: Oral (05/24 0941) BP: 124/62 mmHg (05/24 0941) Pulse Rate: 77 (05/24 1011) Intake/Output from previous day: 05/23 0701 - 05/24 0700 In: 360 [P.O.:360] Out: -  Intake/Output from this shift:    Labs:  Recent Labs  11/27/15 1000 11/28/15 0352 11/29/15 0532  WBC 4.3 2.0* 9.8  HGB 13.7 13.5 13.9  HCT 39.4 39.3 41.5  PLT 151 128* 163     Recent Labs  11/27/15 1000 11/28/15 0352 11/28/15 1552 11/29/15 0532  NA 134* 137  --  139  K 3.4* 2.7* 3.5 3.8  CL 96* 98*  --  98*  CO2 29 30  --  30  GLUCOSE 102* 279*  --  135*  BUN 18 18  --  37*  CREATININE 0.79 0.70  --  0.73  CALCIUM 9.9 9.9  --  10.2  MG  --  1.6*  --  2.0   Estimated Creatinine Clearance: 62.6 mL/min (by C-G formula based on Cr of 0.73).    Recent Labs  11/28/15 1629 11/28/15 2117 11/29/15 0740  GLUCAP 133* 166* 121*    Medical History: Past Medical History  Diagnosis Date  . 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 MSSA (methicillin susceptible Staphylococcus aureus) infection   . 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 Colon cancer ([place])   . Anemia   . Depression   . Hypertension   . Sleep apnea   . Atopic neurodermatitis   . Anxiety   . TIA (transient ischemic attack)   .  COPD (chronic obstructive pulmonary disease) ([place])   . RLS (restless legs syndrome)     Medications:  Scheduled:  . azithromycin  250 mg Oral Daily  . benazepril  40 mg Oral Daily  . calcium carbonate  1,000 mg Oral Q breakfast  . docusate sodium  100 mg Oral BID  . enoxaparin (LOVENOX) injection  40 mg Subcutaneous Q24H  . ferrous sulfate  325 mg Oral TID WC  . hydrochlorothiazide  25 mg Oral Daily  . insulin aspart  0-15 Units Subcutaneous TID WC  . insulin aspart  0-5 Units Subcutaneous QHS  . ipratropium-albuterol  3 mL Nebulization Q6H  . methylPREDNISolone (SOLU-MEDROL) injection  60 mg Intravenous Q12H  . metoprolol succinate  50 mg Oral Daily  . naproxen  500 mg Oral BID WC  . nicotine  14 mg Transdermal Daily  . rOPINIRole  3 mg Oral QID  . sertraline  150 mg Oral Daily   Infusions:     Assessment: 70 yo F with hypokalemia/hypomagnesia Electrolytes within normal limits   Plan:  Electrolyte panel on 5/26.   Philisha Weinel D 11/29/2015,11:03 AM

## 2015-11-29 NOTE — Progress Notes (Signed)
Discharge instructions given and went over with patient and husband at bedside. Prescriptions given. Smoking cessation provided. Patient discharged home with husband. Madlyn Frankel, RN

## 2015-11-29 NOTE — Discharge Summary (Signed)
[facility] NAME: Ann Silva    MR#:  [id number]  DATE OF BIRTH:  1946-06-16  DATE OF ADMISSION:  11/27/2015 ADMITTING PHYSICIAN: Nicholes Mango, MD  DATE OF DISCHARGE: 11/29/2015 12:50 PM  PRIMARY CARE PHYSICIAN: Golden Pop, MD    ADMISSION DIAGNOSIS:  COPD exacerbation ([place]) [J44.1]   DISCHARGE DIAGNOSIS:   COPD exacerbation  SECONDARY DIAGNOSIS:   Past Medical History  Diagnosis Date  . 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 MSSA (methicillin susceptible Staphylococcus aureus) infection   . 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 Colon cancer ([place])   . Anemia   . Depression   . Hypertension   . Sleep apnea   . Atopic neurodermatitis   . Anxiety   . TIA (transient ischemic attack)   . COPD (chronic obstructive pulmonary disease) ([place])   . RLS (restless legs syndrome)     HOSPITAL COURSE:   Ann Silva is a 70 y.o. female with a known history of COPD, restless leg syndrome and essential hypertension is presenting to the ED with a chief complaint of shortness of breath for the past 5 days. Shortness of breath has been getting worse and today she has been wheezing diffusely which made her come to the hospital. Reporting cough but denies any sputum production  #Acute COPD exacerbation Treated with IV Solu-Medrol,change to po prednisone after discharge. Treated with NEB and azithromycin and Antitussives as needed  #Essential hypertension Continue her home medications hydrochlorothiazide and Toprol-XL and titrate as needed  #Restless leg syndrome continue home medication Requip  #Tobacco abuse disorder Counseled patient to quit smoking, nicotine patch  Leukopenia.Improved. Hypokalemia. ImprovedKCl supplement. Hypomagnesemia. Improved with IV mag.  DISCHARGE CONDITIONS:   Stable, discharged to home with home health and PT today.  CONSULTS OBTAINED:     DRUG ALLERGIES:    Allergies  Allergen Reactions  . Hydrocodone-Chlorpheniramine Shortness Of Breath  . Prednisone Shortness Of Breath  . Chlorhexidine Rash  . Methadone Other (See Comments)    Reaction: Altered Mental Status  . Sulfa Antibiotics Other (See Comments)    Reaction:  Unknown   . Benadryl [Diphenhydramine] Anxiety  . Codeine Anxiety  . Diphenhydramine-Zinc Acetate Anxiety    DISCHARGE MEDICATIONS:   Discharge Medication List as of 11/29/2015 12:03 PM    START taking these medications   Details  predniSONE (DELTASONE) 10 MG tablet Take 5 tablets (50 mg total) by mouth daily., Starting 11/27/2015, Until Discontinued, Print      CONTINUE these medications which have NOT CHANGED   Details  albuterol (PROVENTIL HFA;VENTOLIN HFA) 108 (90 Base) MCG/ACT inhaler Inhale 2 puffs into the lungs every 4 (four) hours as needed for wheezing or shortness of breath., Starting 08/23/2015, Until Discontinued, Print    benazepril (LOTENSIN) 40 MG tablet Take 1 tablet (40 mg total) by mouth daily., Starting 04/20/2015, Until Discontinued, Normal    Calcium Carbonate-Vit D-Min (CALCIUM 1200 PO) Take 1 tablet by mouth every evening., Until Discontinued, Historical Med    ferrous sulfate 325 (65 FE) MG tablet Take 650 mg by mouth every evening. , Until Discontinued, Historical Med    fluticasone (FLONASE) 50 MCG/ACT nasal spray Place 1 spray into both nostrils daily as needed for rhinitis. , Until Discontinued, Historical Med    hydrochlorothiazide (HYDRODIURIL) 25 MG tablet Take 25 mg by mouth every evening., Until Discontinued, Historical Med    ipratropium-albuterol (DUONEB) 0.5-2.5 (3) MG/3ML  SOLN Take 3 mLs by nebulization every 6 (six) hours as needed (for wheezing/shortness of breath)., Until Discontinued, Historical Med    metoprolol succinate (TOPROL-XL) 50 MG 24 hr tablet Take 50 mg by mouth every evening. Take with or immediately following a meal., Until Discontinued, Historical Med     naproxen (NAPROSYN) 500 MG tablet Take 500 mg by mouth 2 (two) times daily as needed for mild pain., Until Discontinued, Historical Med    ondansetron (ZOFRAN ODT) 4 MG disintegrating tablet Take 1 tablet (4 mg total) by mouth every 8 (eight) hours as needed for nausea or vomiting., Starting 08/17/2015, Until Discontinued, Normal    rOPINIRole (REQUIP) 3 MG tablet Take 1 tablet (3 mg total) by mouth 4 (four) times daily., Starting 04/20/2015, Until Discontinued, Normal    sertraline (ZOLOFT) 100 MG tablet Take 100 mg by mouth every evening., Until Discontinued, Historical Med      STOP taking these medications     calcium carbonate (OS-CAL - DOSED IN MG OF ELEMENTAL CALCIUM) 1250 (500 Ca) MG tablet          DISCHARGE INSTRUCTIONS:    If you experience worsening of your admission symptoms, develop shortness of breath, life threatening emergency, suicidal or homicidal thoughts you must seek medical attention immediately by calling 911 or calling your MD immediately  if symptoms less severe.  You Must read complete instructions/literature along with all the possible adverse reactions/side effects for all the Medicines you take and that have been prescribed to you. Take any new Medicines after you have completely understood and accept all the possible adverse reactions/side effects.   Please note  You were cared for by a hospitalist during your hospital stay. If you have any questions about your discharge medications or the care you received while you were in the hospital after you are discharged, you can call the unit and asked to speak with the hospitalist on call if the hospitalist that took care of you is not available. Once you are discharged, your primary care physician will handle any further medical issues. Please note that NO REFILLS for any discharge medications will be authorized once you are discharged, as it is imperative that you return to your primary care physician (or establish  a relationship with a primary care physician if you do not have one) for your aftercare needs so that they can reassess your need for medications and monitor your lab values.    Today   SUBJECTIVE    No complaint.  VITAL SIGNS:  Blood pressure 124/62, pulse 77, temperature 97.3 F (36.3 C), temperature source Oral, resp. rate 20, height 5\' 2"  (1.575 m), weight 173 lb 8 oz (78.699 kg), SpO2 95 %.  I/O:   Intake/Output Summary (Last 24 hours) at 11/29/15 1622 Last data filed at 11/29/15 1020  Gross per 24 hour  Intake      0 ml  Output      0 ml  Net      0 ml    PHYSICAL EXAMINATION:  GENERAL:  70 y.o.-year-old patient lying in the bed with no acute distress.  EYES: Pupils equal, round, reactive to light and accommodation. No scleral icterus. Extraocular muscles intact.  HEENT: Head atraumatic, normocephalic. Oropharynx and nasopharynx clear.  NECK:  Supple, no jugular venous distention. No thyroid enlargement, no tenderness.  LUNGS: Normal breath sounds bilaterally, no wheezing, rales,rhonchi or crepitation. No use of accessory muscles of respiration.  CARDIOVASCULAR: S1, S2 normal. No murmurs, rubs, or gallops.  ABDOMEN: Soft, non-tender, non-distended. Bowel sounds present. No organomegaly or mass.  EXTREMITIES: No pedal edema, cyanosis, or clubbing.  NEUROLOGIC: Cranial nerves II through XII are intact. Muscle strength 5/5 in all extremities. Sensation intact. Gait not checked.  PSYCHIATRIC: The patient is alert and oriented x 3.  SKIN: No obvious rash, lesion, or ulcer.   DATA REVIEW:   CBC  Recent Labs Lab 11/29/15 0532  WBC 9.8  HGB 13.9  HCT 41.5  PLT 163    Chemistries   Recent Labs Lab 11/29/15 0532  NA 139  K 3.8  CL 98*  CO2 30  GLUCOSE 135*  BUN 37*  CREATININE 0.73  CALCIUM 10.2  MG 2.0    Cardiac Enzymes  Recent Labs Lab 11/27/15 1000  TROPONINI <0.03    Microbiology Results  Results for orders placed or performed in visit on  08/14/15  Influenza a and b     Status: None   Collection Time: 08/14/15  3:14 PM  Result Value Ref Range Status   Influenza A Ag, EIA Negative Negative Final   Influenza B Ag, EIA Negative Negative Final   Influenza Comment See note  Final    RADIOLOGY:  No results found.      Management plans discussed with the patient, family and they are in agreement.  CODE STATUS:  Code Status History    Date Active Date Inactive Code Status Order ID Comments User Context   11/27/2015  8:47 PM 11/29/2015  4:08 PM Full Code [id number]  Nicholes Mango, MD Inpatient   08/22/2015 12:44 AM 08/23/2015  3:58 PM Full Code [id number]  Lance Coon, MD Inpatient      TOTAL TIME TAKING CARE OF THIS PATIENT: 36 minutes.    Demetrios Loll M.D on 11/29/2015 at 4:22 PM  Between 7am to 6pm - Pager - [phone]  After 6pm go to [email or web address] - password [email or web address] [facility]  Office  [phone]  CC: Primary care physician; Golden Pop, MD

## 2015-11-29 NOTE — Progress Notes (Signed)
Swelling to face.  Pt scratching around chin and has several new open sores to chin and cheek.  Pt reports that she gets these on her arms, neck and legs.  Noted multiple sites with sore in various stages of healing. Dorna Bloom RN

## 2015-11-29 NOTE — Care Management Important Message (Signed)
Important Message  Patient Details  Name: Ann Silva MRN: [id number] Date of Birth: 1945/08/19   Medicare Important Message Given:  Yes    [name] 11/29/2015, 10:39 AM

## 2015-11-29 NOTE — Discharge Instructions (Signed)
You were evaluated for shortness of breath and wheezing and are being treated for COPD exacerbation, with prescription for prednisone for the next few days. He should still take her albuterol at home every 4 hours as needed for wheezing and shortness of breath.  Return to the department immediately for any chest pain, trouble breathing, confusion or altered mental status, dizziness or passing out, or any other symptoms concerning to you.   Chronic Obstructive Pulmonary Disease Exacerbation Chronic obstructive pulmonary disease (COPD) is a common lung problem. In COPD, the flow of air from the lungs is limited. COPD exacerbations are times that breathing gets worse and you need extra treatment. Without treatment they can be life threatening. If they happen often, your lungs can become more damaged. If your COPD gets worse, your doctor may treat you with:  Medicines.  Oxygen.  Different ways to clear your airway, such as using a mask. HOME CARE  Do not smoke.  Avoid tobacco smoke and other things that bother your lungs.  If given, take your antibiotic medicine as told. Finish the medicine even if you start to feel better.  Only take medicines as told by your doctor.  Drink enough fluids to keep your pee (urine) clear or pale yellow (unless your doctor has told you not to).  Use a cool mist machine (vaporizer).  If you use oxygen or a machine that turns liquid medicine into a mist (nebulizer), continue to use them as told.  Keep up with shots (vaccinations) as told by your doctor.  Exercise regularly.  Eat healthy foods.  Keep all doctor visits as told. GET HELP RIGHT AWAY IF:  You are very short of breath and it gets worse.  You have trouble talking.  You have bad chest pain.  You have blood in your spit (sputum).  You have a fever.  You keep throwing up (vomiting).  You feel weak, or you pass out (faint).  You feel confused.  You keep getting worse. MAKE SURE  YOU:  Understand these instructions.  Will watch your condition.  Will get help right away if you are not doing well or get worse.   This information is not intended to replace advice given to you by your health care provider. Make sure you discuss any questions you have with your health care provider.   Document Released: 06/13/2011 Document Revised: 07/15/2014 Document Reviewed: 02/26/2013 Elsevier Interactive Patient Education 2016 [place].  Heart healthy and ADA diet. Activity as tolerated. Smoking cessation.  HHPT

## 2015-11-29 NOTE — Care Management (Signed)
Discharge to home today per Dr. Bridgett Larsson. Physical therapy evaluation completed. Recommends home with home health and therapy. Ms. Beegle requested [place]. Telephone call to [facility]. Will be able to accept Ms. Robak and see within 48hours. Husband will transport. Shelbie Ammons RN MSN CCM Care Management [phone]

## 2015-12-01 ENCOUNTER — Telehealth: Payer: Self-pay | Admitting: Family Medicine

## 2015-12-01 DIAGNOSIS — F329 Major depressive disorder, single episode, unspecified: Secondary | ICD-10-CM | POA: Diagnosis not present

## 2015-12-01 DIAGNOSIS — I10 Essential (primary) hypertension: Secondary | ICD-10-CM | POA: Diagnosis not present

## 2015-12-01 DIAGNOSIS — G2581 Restless legs syndrome: Secondary | ICD-10-CM | POA: Diagnosis not present

## 2015-12-01 DIAGNOSIS — J441 Chronic obstructive pulmonary disease with (acute) exacerbation: Secondary | ICD-10-CM | POA: Diagnosis not present

## 2015-12-01 DIAGNOSIS — G4730 Sleep apnea, unspecified: Secondary | ICD-10-CM | POA: Diagnosis not present

## 2015-12-01 DIAGNOSIS — F419 Anxiety disorder, unspecified: Secondary | ICD-10-CM | POA: Diagnosis not present

## 2015-12-01 NOTE — Telephone Encounter (Signed)
Routing to provider  

## 2015-12-01 NOTE — Telephone Encounter (Signed)
Ann Silva from [facility] would like to get orders for home health to monitor the pts COPD. They would like to get orders to go 2 times a week for 8 weeks.

## 2015-12-02 ENCOUNTER — Inpatient Hospital Stay
Admission: EM | Admit: 2015-12-02 | Discharge: 2015-12-05 | DRG: 192 | Disposition: A | Discharge status: Home / Self Care | Payer: Medicare Other | Attending: Internal Medicine | Admitting: Internal Medicine

## 2015-12-02 ENCOUNTER — Encounter: Payer: Self-pay | Admitting: Emergency Medicine

## 2015-12-02 ENCOUNTER — Emergency Department: Payer: Medicare Other

## 2015-12-02 DIAGNOSIS — Z85038 Personal history of other malignant neoplasm of large intestine: Secondary | ICD-10-CM

## 2015-12-02 DIAGNOSIS — Z882 Allergy status to sulfonamides status: Secondary | ICD-10-CM

## 2015-12-02 DIAGNOSIS — F329 Major depressive disorder, single episode, unspecified: Secondary | ICD-10-CM | POA: Diagnosis not present

## 2015-12-02 DIAGNOSIS — I10 Essential (primary) hypertension: Secondary | ICD-10-CM | POA: Diagnosis present

## 2015-12-02 DIAGNOSIS — C189 Malignant neoplasm of colon, unspecified: Secondary | ICD-10-CM | POA: Diagnosis present

## 2015-12-02 DIAGNOSIS — Z9049 Acquired absence of other specified parts of digestive tract: Secondary | ICD-10-CM

## 2015-12-02 DIAGNOSIS — Z7951 Long term (current) use of inhaled steroids: Secondary | ICD-10-CM

## 2015-12-02 DIAGNOSIS — Z90710 Acquired absence of both cervix and uterus: Secondary | ICD-10-CM

## 2015-12-02 DIAGNOSIS — Z8673 Personal history of transient ischemic attack (TIA), and cerebral infarction without residual deficits: Secondary | ICD-10-CM

## 2015-12-02 DIAGNOSIS — G4730 Sleep apnea, unspecified: Secondary | ICD-10-CM | POA: Diagnosis present

## 2015-12-02 DIAGNOSIS — G2581 Restless legs syndrome: Secondary | ICD-10-CM | POA: Diagnosis not present

## 2015-12-02 DIAGNOSIS — J441 Chronic obstructive pulmonary disease with (acute) exacerbation: Secondary | ICD-10-CM

## 2015-12-02 DIAGNOSIS — Z79899 Other long term (current) drug therapy: Secondary | ICD-10-CM

## 2015-12-02 DIAGNOSIS — F32A Depression, unspecified: Secondary | ICD-10-CM | POA: Diagnosis present

## 2015-12-02 DIAGNOSIS — R0602 Shortness of breath: Secondary | ICD-10-CM | POA: Diagnosis not present

## 2015-12-02 DIAGNOSIS — Z96649 Presence of unspecified artificial hip joint: Secondary | ICD-10-CM | POA: Diagnosis present

## 2015-12-02 DIAGNOSIS — Z818 Family history of other mental and behavioral disorders: Secondary | ICD-10-CM

## 2015-12-02 DIAGNOSIS — F17210 Nicotine dependence, cigarettes, uncomplicated: Secondary | ICD-10-CM | POA: Diagnosis not present

## 2015-12-02 DIAGNOSIS — Z886 Allergy status to analgesic agent status: Secondary | ICD-10-CM

## 2015-12-02 DIAGNOSIS — Z888 Allergy status to other drugs, medicaments and biological substances status: Secondary | ICD-10-CM

## 2015-12-02 DIAGNOSIS — Z809 Family history of malignant neoplasm, unspecified: Secondary | ICD-10-CM

## 2015-12-02 DIAGNOSIS — Z823 Family history of stroke: Secondary | ICD-10-CM

## 2015-12-02 DIAGNOSIS — G4700 Insomnia, unspecified: Secondary | ICD-10-CM | POA: Diagnosis present

## 2015-12-02 DIAGNOSIS — Z833 Family history of diabetes mellitus: Secondary | ICD-10-CM

## 2015-12-02 LAB — CBC WITH DIFFERENTIAL/PLATELET
Basophils Absolute: 0 10*3/uL (ref 0–0.1)
Eosinophils Absolute: 0.1 10*3/uL (ref 0–0.7)
Eosinophils Relative: 1 %
HEMATOCRIT: 41.5 % (ref 35.0–47.0)
HEMOGLOBIN: 14.2 g/dL (ref 12.0–16.0)
LYMPHS ABS: 1.6 10*3/uL (ref 1.0–3.6)
Lymphocytes Relative: 16 %
MCH: 30.2 pg (ref 26.0–34.0)
MCHC: 34.1 g/dL (ref 32.0–36.0)
MCV: 88.5 fL (ref 80.0–100.0)
Monocytes Absolute: 0.3 10*3/uL (ref 0.2–0.9)
NEUTROS ABS: 8.1 10*3/uL — AB (ref 1.4–6.5)
PLATELETS: 258 10*3/uL (ref 150–440)
RBC: 4.68 MIL/uL (ref 3.80–5.20)
RDW: 14.5 % (ref 11.5–14.5)
WBC: 10.2 10*3/uL (ref 3.6–11.0)

## 2015-12-02 LAB — BASIC METABOLIC PANEL
ANION GAP: 11 (ref 5–15)
BUN: 31 mg/dL — ABNORMAL HIGH (ref 6–20)
CALCIUM: 10.7 mg/dL — AB (ref 8.9–10.3)
CO2: 28 mmol/L (ref 22–32)
CREATININE: 0.73 mg/dL (ref 0.44–1.00)
Chloride: 100 mmol/L — ABNORMAL LOW (ref 101–111)
Glucose, Bld: 146 mg/dL — ABNORMAL HIGH (ref 65–99)
Potassium: 3.5 mmol/L (ref 3.5–5.1)
SODIUM: 139 mmol/L (ref 135–145)

## 2015-12-02 LAB — TROPONIN I

## 2015-12-02 MED ORDER — ALBUTEROL SULFATE (2.5 MG/3ML) 0.083% IN NEBU
7.5000 mg | INHALATION_SOLUTION | Freq: Once | RESPIRATORY_TRACT | Status: AC
  Administered 2015-12-03: 7.5 mg via RESPIRATORY_TRACT
  Filled 2015-12-02: qty 9

## 2015-12-02 MED ORDER — LEVOFLOXACIN IN D5W 750 MG/150ML IV SOLN: 750.0000 mg | Freq: Once | INTRAVENOUS | Status: DC

## 2015-12-02 MED ORDER — IPRATROPIUM-ALBUTEROL 0.5-2.5 (3) MG/3ML IN SOLN
9.0000 mL | Freq: Once | RESPIRATORY_TRACT | Status: AC
  Administered 2015-12-02: 9 mL via RESPIRATORY_TRACT
  Filled 2015-12-02: qty 9

## 2015-12-02 MED ORDER — METHYLPREDNISOLONE SODIUM SUCC 125 MG IJ SOLR
125.0000 mg | Freq: Once | INTRAMUSCULAR | Status: AC
  Administered 2015-12-02: 125 mg via INTRAVENOUS
  Filled 2015-12-02: qty 2

## 2015-12-02 MED ORDER — DEXTROSE 5 % IV SOLN
500.0000 mg | Freq: Once | INTRAVENOUS | Status: AC
  Administered 2015-12-03: 500 mg via INTRAVENOUS
  Filled 2015-12-02: qty 500

## 2015-12-02 NOTE — ED Provider Notes (Signed)
[facility] Emergency Department Provider Note   ____________________________________________  Time seen: Approximately [id number] PM  I have reviewed the triage vital signs and the nursing notes.   HISTORY  Chief Complaint Shortness of Breath   HPI Ann Silva is a 70 y.o. female with a history of colon cancer as well as COPD who is presenting to the emergency department with shortness of breath. Ann Silva that she was just discharged from this hospital several days ago and has been consistently worsening. She says that she has worsening short of breath despite taking steroids. Was not discharged on antibiotics. Says that she is also been smoking several cigarettes a day since discharge. Says also has intermittent cramping pain to her left side of her back and lateral thorax. Denies any pain at this time. No worsening with deep breathing. No worsening with cough.   Past Medical History  Diagnosis Date  . 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 MSSA (methicillin susceptible Staphylococcus aureus) infection   . 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 Colon cancer ([place])   . Anemia   . Depression   . Hypertension   . Sleep apnea   . Atopic neurodermatitis   . Anxiety   . TIA (transient ischemic attack)   . COPD (chronic obstructive pulmonary disease) ([place])   . RLS (restless legs syndrome)     Patient Active Problem List   Diagnosis Date Noted  . COPD with acute exacerbation ([place]) 11/27/2015  . COPD exacerbation ([place]) 08/21/2015  . Restless legs 02/27/2015  . Hypertension   . Colon cancer ([place])   . Depression   . History of repair of hip joint 10/25/2013    Past Surgical History  Procedure Laterality Date  . Total hip arthroplasty    . Cholecystectomy    . Colon surgery    . Abdominal hysterectomy    . Revision of total hip arthroplasty    . Resection total hip    . Joint replacement      Current Outpatient Rx  Name  Route   Sig  Dispense  Refill  . albuterol (PROVENTIL HFA;VENTOLIN HFA) 108 (90 Base) MCG/ACT inhaler   Inhalation   Inhale 2 puffs into the lungs every 4 (four) hours as needed for wheezing or shortness of breath.   1 Inhaler   2   . benazepril (LOTENSIN) 40 MG tablet   Oral   Take 1 tablet (40 mg total) by mouth daily.   90 tablet   4   . Calcium Carbonate-Vit D-Min (CALCIUM 1200 PO)   Oral   Take 1 tablet by mouth every evening.         . ferrous sulfate 325 (65 FE) MG tablet   Oral   Take 650 mg by mouth every evening.          . hydrochlorothiazide (HYDRODIURIL) 25 MG tablet   Oral   Take 25 mg by mouth every evening.         . metoprolol succinate (TOPROL-XL) 50 MG 24 hr tablet   Oral   Take 50 mg by mouth every evening. Take with or immediately following a meal.         . naproxen (NAPROSYN) 500 MG tablet   Oral   Take 500 mg by mouth 2 (two) times daily as needed for mild pain.         . predniSONE (DELTASONE) 10 MG tablet  40 mg po daily for 2 days, 20 mg po daily for 2 days,10 mg po daily for 2 days   14 tablet   0   . rOPINIRole (REQUIP) 3 MG tablet   Oral   Take 1 tablet (3 mg total) by mouth 4 (four) times daily.   360 tablet   4   . sertraline (ZOLOFT) 100 MG tablet   Oral   Take 100 mg by mouth every evening.         . fluticasone (FLONASE) 50 MCG/ACT nasal spray   Each Nare   Place 1 spray into both nostrils daily as needed for rhinitis.          Marland Kitchen ipratropium-albuterol (DUONEB) 0.5-2.5 (3) MG/3ML SOLN   Nebulization   Take 3 mLs by nebulization every 6 (six) hours as needed (for wheezing/shortness of breath).         . ondansetron (ZOFRAN ODT) 4 MG disintegrating tablet   Oral   Take 1 tablet (4 mg total) by mouth every 8 (eight) hours as needed for nausea or vomiting.   20 tablet   0     Allergies Hydrocodone-chlorpheniramine; Prednisone; Chlorhexidine; Methadone; Sulfa antibiotics; Benadryl; Codeine; and  Diphenhydramine-zinc acetate  Family History  Problem Relation Age of Onset  . Diabetes Sister   . Mental illness Sister   . Cancer Mother   . Stroke Father     Social History Social History  Substance Use Topics  . Smoking status: Current Every Day Smoker -- 0.10 packs/day    Types: Cigarettes    Start date: 07/08/1964  . Smokeless tobacco: Never Used  . Alcohol Use: No    Review of Systems Constitutional: No fever/chills Eyes: No visual changes. ENT: No sore throat. Cardiovascular: Denies chest pain. Respiratory: As above. Gastrointestinal: No abdominal pain.  No nausea, no vomiting.  No diarrhea.  No constipation. Genitourinary: Negative for dysuria. Musculoskeletal: As above  Skin: Negative for rash. Neurological: Negative for headaches, focal weakness or numbness.  10-point ROS otherwise negative.  ____________________________________________   PHYSICAL EXAM:  VITAL SIGNS: ED Triage Vitals  Enc Vitals Group     BP 12/02/15 1800 105/60 mmHg     Pulse Rate 12/02/15 1800 80     Resp 12/02/15 1800 20     Temp 12/02/15 1800 98.7 F (37.1 C)     Temp Source 12/02/15 1800 Oral     SpO2 12/02/15 1800 92 %     Weight 12/02/15 1800 172 lb (78.019 kg)     Height 12/02/15 1800 5\' 2"  (1.575 m)     Head Cir --      Peak Flow --      Pain Score 12/02/15 1801 4     Pain Loc --      Pain Edu? --      Excl. in [place]? --     Constitutional: Alert and oriented. Well appearing and in no acute distress. Eyes: Conjunctivae are normal. PERRL. EOMI. Head: Atraumatic. Nose: No congestion/rhinnorhea. Mouth/Throat: Mucous membranes are moist.   Neck: No stridor.   Cardiovascular: Normal rate, regular rhythm. Grossly normal heart sounds.   Respiratory: Normal respiratory effort.  No retractions. Wheezing throughout with decreased air movement and prolonged expiratory phase with expiratory cough. Gastrointestinal: Soft and nontender. No distention. No CVA tenderness  palpation. Musculoskeletal: Bilateral lower extremity edema which the patient says is chronic.  No joint effusions. No tenderness to the back. Neurologic:  Normal speech and language. No gross focal neurologic  deficits are appreciated.  Skin:  Skin is warm, dry and intact. No rash noted. Psychiatric: Mood and affect are normal. Speech and behavior are normal.  ____________________________________________   LABS (all labs ordered are listed, but only abnormal results are displayed)  Labs Reviewed  CBC WITH DIFFERENTIAL/PLATELET - Abnormal; Notable for the following:    Neutro Abs 8.1 (*)    All other components within normal limits  BASIC METABOLIC PANEL - Abnormal; Notable for the following:    Chloride 100 (*)    Glucose, Bld 146 (*)    BUN 31 (*)    Calcium 10.7 (*)    All other components within normal limits  TROPONIN I   ____________________________________________  EKG  ED ECG REPORT I, Doran Stabler, the attending physician, personally viewed and interpreted this ECG.   Date: 12/02/2015  EKG Time: 2037  Rate: 70  Rhythm: normal sinus rhythm  Axis: Normal axis  Intervals:none  ST&T Change: No ST elevation or depression. PVC times one.  ____________________________________________  RADIOLOGY  DG Chest 2 View (Final result) Result time: 12/02/15 18:35:34   Final result by Rad Results In Interface (12/02/15 18:35:34)   Narrative:   CLINICAL DATA: Shortness of breath  EXAM: CHEST 2 VIEW  COMPARISON: 11/27/2015 chest radiograph.  FINDINGS: Stable cardiomediastinal silhouette with normal heart size. No pneumothorax. No pleural effusion. Mildly hyperinflated lungs. No pulmonary edema. No acute consolidative airspace disease.  IMPRESSION: Mildly hyperinflated lungs, suggesting obstructive lung disease. Otherwise no active disease in the chest.   Electronically Signed By: Ilona Sorrel M.D. On: 12/02/2015 18:35        ____________________________________________   PROCEDURES   ____________________________________________   INITIAL IMPRESSION / ASSESSMENT AND PLAN / ED COURSE  Pertinent labs & imaging results that were available during my care of the patient were reviewed by me and considered in my medical decision making (see chart for details).  ----------------------------------------- 11:40 PM on 12/02/2015 -----------------------------------------  Patient reassessed and feeling that she has not improved. Re-auscultated her lungs and she is still wheezing throughout with decreased air movement and extra cough. Will be admitted to the hospital. Signed out to Dr. Claria Dice.  Patient and her stenting and plan and willing to comply. We'll also write for azithromycin. ____________________________________________   FINAL CLINICAL IMPRESSION(S) / ED DIAGNOSES  COPD exacerbation. Failure outpatient treatment.    NEW MEDICATIONS STARTED DURING THIS VISIT:  New Prescriptions   No medications on file     Note:  This document was prepared using Dragon voice recognition software and may include unintentional dictation errors.    Orbie Pyo, MD 12/02/15 [phone]

## 2015-12-02 NOTE — ED Notes (Signed)
States was admitted Monday for 2 days for respiratory problems. States feels like shortness of breath has increased since discharge. Speaking full sentences in triage.

## 2015-12-03 DIAGNOSIS — Z720 Tobacco use: Secondary | ICD-10-CM | POA: Diagnosis not present

## 2015-12-03 DIAGNOSIS — Z882 Allergy status to sulfonamides status: Secondary | ICD-10-CM | POA: Diagnosis not present

## 2015-12-03 DIAGNOSIS — Z7951 Long term (current) use of inhaled steroids: Secondary | ICD-10-CM | POA: Diagnosis not present

## 2015-12-03 DIAGNOSIS — Z833 Family history of diabetes mellitus: Secondary | ICD-10-CM | POA: Diagnosis not present

## 2015-12-03 DIAGNOSIS — G2581 Restless legs syndrome: Secondary | ICD-10-CM | POA: Diagnosis not present

## 2015-12-03 DIAGNOSIS — G4700 Insomnia, unspecified: Secondary | ICD-10-CM | POA: Diagnosis present

## 2015-12-03 DIAGNOSIS — Z8673 Personal history of transient ischemic attack (TIA), and cerebral infarction without residual deficits: Secondary | ICD-10-CM | POA: Diagnosis not present

## 2015-12-03 DIAGNOSIS — I10 Essential (primary) hypertension: Secondary | ICD-10-CM | POA: Diagnosis not present

## 2015-12-03 DIAGNOSIS — Z809 Family history of malignant neoplasm, unspecified: Secondary | ICD-10-CM | POA: Diagnosis not present

## 2015-12-03 DIAGNOSIS — Z90710 Acquired absence of both cervix and uterus: Secondary | ICD-10-CM | POA: Diagnosis not present

## 2015-12-03 DIAGNOSIS — J441 Chronic obstructive pulmonary disease with (acute) exacerbation: Secondary | ICD-10-CM | POA: Diagnosis not present

## 2015-12-03 DIAGNOSIS — Z823 Family history of stroke: Secondary | ICD-10-CM | POA: Diagnosis not present

## 2015-12-03 DIAGNOSIS — F17210 Nicotine dependence, cigarettes, uncomplicated: Secondary | ICD-10-CM | POA: Diagnosis present

## 2015-12-03 DIAGNOSIS — Z9049 Acquired absence of other specified parts of digestive tract: Secondary | ICD-10-CM | POA: Diagnosis not present

## 2015-12-03 DIAGNOSIS — Z886 Allergy status to analgesic agent status: Secondary | ICD-10-CM | POA: Diagnosis not present

## 2015-12-03 DIAGNOSIS — Z79899 Other long term (current) drug therapy: Secondary | ICD-10-CM | POA: Diagnosis not present

## 2015-12-03 DIAGNOSIS — Z888 Allergy status to other drugs, medicaments and biological substances status: Secondary | ICD-10-CM | POA: Diagnosis not present

## 2015-12-03 DIAGNOSIS — Z85038 Personal history of other malignant neoplasm of large intestine: Secondary | ICD-10-CM | POA: Diagnosis not present

## 2015-12-03 DIAGNOSIS — G4730 Sleep apnea, unspecified: Secondary | ICD-10-CM | POA: Diagnosis present

## 2015-12-03 DIAGNOSIS — Z818 Family history of other mental and behavioral disorders: Secondary | ICD-10-CM | POA: Diagnosis not present

## 2015-12-03 DIAGNOSIS — Z96649 Presence of unspecified artificial hip joint: Secondary | ICD-10-CM | POA: Diagnosis present

## 2015-12-03 DIAGNOSIS — F329 Major depressive disorder, single episode, unspecified: Secondary | ICD-10-CM | POA: Diagnosis present

## 2015-12-03 MED ORDER — ACETAMINOPHEN 650 MG RE SUPP: 650.0000 mg | Freq: Four times a day (QID) | RECTAL | Status: DC | PRN

## 2015-12-03 MED ORDER — METHYLPREDNISOLONE SODIUM SUCC 125 MG IJ SOLR
60.0000 mg | Freq: Four times a day (QID) | INTRAMUSCULAR | Status: DC
Start: 2015-12-03 — End: 2015-12-05
  Administered 2015-12-03 – 2015-12-05 (×7): 60 mg via INTRAVENOUS
  Filled 2015-12-03 (×7): qty 2

## 2015-12-03 MED ORDER — BENAZEPRIL HCL 20 MG PO TABS
40.0000 mg | ORAL_TABLET | Freq: Every day | ORAL | Status: DC
  Administered 2015-12-03 – 2015-12-05 (×3): 40 mg via ORAL
  Filled 2015-12-03 (×3): qty 2

## 2015-12-03 MED ORDER — NYSTATIN 100000 UNIT/ML MT SUSP
5.0000 mL | Freq: Four times a day (QID) | OROMUCOSAL | Status: DC
  Administered 2015-12-03 – 2015-12-05 (×6): 500000 [IU] via ORAL
  Filled 2015-12-03 (×6): qty 5

## 2015-12-03 MED ORDER — NICOTINE 14 MG/24HR TD PT24
14.0000 mg | MEDICATED_PATCH | Freq: Every day | TRANSDERMAL | Status: DC
  Administered 2015-12-03 – 2015-12-05 (×3): 14 mg via TRANSDERMAL
  Filled 2015-12-03 (×3): qty 1

## 2015-12-03 MED ORDER — HYDROCHLOROTHIAZIDE 25 MG PO TABS
25.0000 mg | ORAL_TABLET | Freq: Every evening | ORAL | Status: DC
  Administered 2015-12-03 – 2015-12-04 (×2): 25 mg via ORAL
  Filled 2015-12-03 (×2): qty 1

## 2015-12-03 MED ORDER — SERTRALINE HCL 100 MG PO TABS
100.0000 mg | ORAL_TABLET | Freq: Every evening | ORAL | Status: DC
  Administered 2015-12-03 – 2015-12-04 (×2): 100 mg via ORAL
  Filled 2015-12-03 (×2): qty 1

## 2015-12-03 MED ORDER — METOPROLOL SUCCINATE ER 50 MG PO TB24
50.0000 mg | ORAL_TABLET | Freq: Every evening | ORAL | Status: DC
  Administered 2015-12-03 – 2015-12-04 (×2): 50 mg via ORAL
  Filled 2015-12-03 (×2): qty 1

## 2015-12-03 MED ORDER — ENOXAPARIN SODIUM 40 MG/0.4ML ~~LOC~~ SOLN
40.0000 mg | Freq: Every day | SUBCUTANEOUS | Status: DC
  Administered 2015-12-03 – 2015-12-05 (×3): 40 mg via SUBCUTANEOUS
  Filled 2015-12-03 (×3): qty 0.4

## 2015-12-03 MED ORDER — AZITHROMYCIN 250 MG PO TABS
250.0000 mg | ORAL_TABLET | Freq: Every day | ORAL | Status: DC
  Administered 2015-12-04 – 2015-12-05 (×2): 250 mg via ORAL
  Filled 2015-12-03 (×2): qty 1

## 2015-12-03 MED ORDER — ACETAMINOPHEN 325 MG PO TABS
650.0000 mg | ORAL_TABLET | Freq: Four times a day (QID) | ORAL | Status: DC | PRN
Start: 2015-12-03 — End: 2015-12-05

## 2015-12-03 MED ORDER — ROPINIROLE HCL 1 MG PO TABS
3.0000 mg | ORAL_TABLET | Freq: Once | ORAL | Status: AC
  Administered 2015-12-03: 05:00:00 3 mg via ORAL

## 2015-12-03 MED ORDER — IPRATROPIUM-ALBUTEROL 0.5-2.5 (3) MG/3ML IN SOLN
3.0000 mL | Freq: Four times a day (QID) | RESPIRATORY_TRACT | Status: DC
  Administered 2015-12-03 – 2015-12-05 (×9): 3 mL via RESPIRATORY_TRACT
  Filled 2015-12-03 (×9): qty 3

## 2015-12-03 MED ORDER — POTASSIUM CHLORIDE CRYS ER 20 MEQ PO TBCR
40.0000 meq | EXTENDED_RELEASE_TABLET | Freq: Once | ORAL | Status: AC
  Administered 2015-12-03: 08:00:00 40 meq via ORAL
  Filled 2015-12-03: qty 2

## 2015-12-03 MED ORDER — ROPINIROLE HCL 1 MG PO TABS
3.0000 mg | ORAL_TABLET | Freq: Four times a day (QID) | ORAL | Status: DC
Start: 2015-12-03 — End: 2015-12-05
  Administered 2015-12-03 – 2015-12-05 (×8): 3 mg via ORAL
  Filled 2015-12-03 (×9): qty 3

## 2015-12-03 MED ORDER — METHYLPREDNISOLONE SODIUM SUCC 125 MG IJ SOLR
80.0000 mg | Freq: Four times a day (QID) | INTRAMUSCULAR | Status: DC
  Administered 2015-12-03 (×2): 80 mg via INTRAVENOUS
  Filled 2015-12-03 (×2): qty 2

## 2015-12-03 NOTE — ED Notes (Signed)
Assisted patient up to bathroom 

## 2015-12-03 NOTE — Progress Notes (Signed)
Patient ID: Ann Silva, female   DOB: [birth year], 70 y.o.   MRN: [id number] [facility] PROGRESS NOTE  Ann Silva [id number] DOB: [birth year] DOA: 12/02/2015 PCP: Golden Pop, MD  HPI/Subjective: Patient went home and smoked a few cigarettes. She got worse when she went home with regards to her breathing, wheezing and weakness. She felt like she was almost in a pass out.  Objective: Filed Vitals:   12/03/15 0800 12/03/15 1252  BP: 145/75 153/83  Pulse: 85 80  Temp:  97.8 F (36.6 C)  Resp: 16 22    Filed Weights   12/02/15 1800 12/03/15 0357  Weight: 78.019 kg (172 lb) 79.107 kg (174 lb 6.4 oz)    ROS: Review of Systems  Constitutional: Negative for fever and chills.  Eyes: Negative for blurred vision.  Respiratory: Positive for cough, shortness of breath and wheezing.   Cardiovascular: Negative for chest pain.  Gastrointestinal: Negative for nausea, vomiting, abdominal pain, diarrhea and constipation.  Genitourinary: Negative for dysuria.  Musculoskeletal: Negative for joint pain.  Neurological: Negative for dizziness and headaches.   Exam: Physical Exam  Constitutional: She is oriented to person, place, and time.  HENT:  Nose: No mucosal edema.  Mouth/Throat: No oropharyngeal exudate or posterior oropharyngeal edema.  Eyes: Conjunctivae, EOM and lids are normal. Pupils are equal, round, and reactive to light.  Neck: No JVD present. Carotid bruit is not present. No edema present. No thyroid mass and no thyromegaly present.  Cardiovascular: S1 normal and S2 normal.  Exam reveals no gallop.   No murmur heard. Pulses:      Dorsalis pedis pulses are 2+ on the right side, and 2+ on the left side.  Respiratory: No respiratory distress. She has decreased breath sounds in the right lower field and the left lower field. She has wheezes in the right middle field, the right lower field, the left middle field and the left lower field. She has no rhonchi. She has no  rales.  GI: Soft. Bowel sounds are normal. There is no tenderness.  Musculoskeletal:       Right ankle: She exhibits swelling.       Left ankle: She exhibits swelling.  Lymphadenopathy:    She has no cervical adenopathy.  Neurological: She is alert and oriented to person, place, and time. No cranial nerve deficit.  Skin: Skin is warm. No rash noted. Nails show no clubbing.  Psychiatric: She has a normal mood and affect.      Data Reviewed: Basic Metabolic Panel:  Recent Labs Lab 11/27/15 1000 11/28/15 0352 11/28/15 1552 11/29/15 0532 12/02/15 2252  NA 134* 137  --  139 139  K 3.4* 2.7* 3.5 3.8 3.5  CL 96* 98*  --  98* 100*  CO2 29 30  --  30 28  GLUCOSE 102* 279*  --  135* 146*  BUN 18 18  --  37* 31*  CREATININE 0.79 0.70  --  0.73 0.73  CALCIUM 9.9 9.9  --  10.2 10.7*  MG  --  1.6*  --  2.0  --    CBC:  Recent Labs Lab 11/27/15 1000 11/28/15 0352 11/29/15 0532 12/02/15 2252  WBC 4.3 2.0* 9.8 10.2  NEUTROABS 3.1  --   --  8.1*  HGB 13.7 13.5 13.9 14.2  HCT 39.4 39.3 41.5 41.5  MCV 88.7 90.3 91.2 88.5  PLT 151 128* 163 258   Cardiac Enzymes:  Recent Labs Lab 11/27/15 1000 12/02/15 2252  TROPONINI <0.03 <  0.03    CBG:  Recent Labs Lab 11/28/15 1204 11/28/15 1629 11/28/15 2117 11/29/15 0740  GLUCAP 154* 133* 166* 121*     Studies: Dg Chest 2 View  12/02/2015  CLINICAL DATA:  Shortness of breath EXAM: CHEST  2 VIEW COMPARISON:  11/27/2015 chest radiograph. FINDINGS: Stable cardiomediastinal silhouette with normal heart size. No pneumothorax. No pleural effusion. Mildly hyperinflated lungs. No pulmonary edema. No acute consolidative airspace disease. IMPRESSION: Mildly hyperinflated lungs, suggesting obstructive lung disease. Otherwise no active disease in the chest. Electronically Signed   By: Ilona Sorrel M.D.   On: 12/02/2015 18:35    Scheduled Meds: . [START ON 12/04/2015] azithromycin  250 mg Oral Daily  . benazepril  40 mg Oral Daily  .  enoxaparin (LOVENOX) injection  40 mg Subcutaneous Q0600  . hydrochlorothiazide  25 mg Oral QPM  . ipratropium-albuterol  3 mL Nebulization Q6H  . methylPREDNISolone (SOLU-MEDROL) injection  60 mg Intravenous Q6H  . metoprolol succinate  50 mg Oral QPM  . nicotine  14 mg Transdermal Daily  . rOPINIRole  3 mg Oral QID  . sertraline  100 mg Oral QPM    Assessment/Plan:  1. COPD exacerbation. Decrease Solu-Medrol to 60 mg IV every 8 hours. Patient must stop smoking. Patient coughing when she takes a deep breath. Continue DuoNeb nebulizer solution and add budesonide nebulizers. Patient states she takes Symbicort at home. Zithromax 2. Tobacco abuse smoking cessation counseling done 3 minutes by me. Nicotine patch ordered 3. Essential hypertension continue hydrochlorothiazide and metoprolol and benazepril 4. Restless leg syndrome on Requip 5. History of colon cancer  Code Status:     Code Status Orders        Start     Ordered   12/03/15 0400  Full code   Continuous     12/03/15 0359    Code Status History    Date Active Date Inactive Code Status Order ID Comments User Context   11/27/2015  8:47 PM 11/29/2015  4:08 PM Full Code [id number]  Nicholes Mango, MD Inpatient   08/22/2015 12:44 AM 08/23/2015  3:58 PM Full Code [id number]  Lance Coon, MD Inpatient     Family Communication: Husband at bedside Disposition Plan: Home once breathing better  Antibiotics:  Zithromax  Time spent: 35 minutes  [place], [place]

## 2015-12-03 NOTE — Progress Notes (Signed)
Allergy Information   Patient has documented allergy to Hydorocodone-chlorphniramine epic states there is cross reactivity between Nystatin and Hydrocodone combo.  RN spoke with MD who is ok with continuing medication.  Spoke with RN and she will watch patient closely for any s/s of an allergic rxn.   ,Larene Beach, PharmD

## 2015-12-03 NOTE — H&P (Signed)
PCP:   Golden Pop, MD   Chief Complaint:  Shortness of breath  HPI: This is a 70 year old female was recently admitted acute exacerbation of COPD, discharged Wednesday. Patient states when she was discharged she was still wheezing slightly. She states her wheezing has continued to get worse. She has a cough that is nonproductive. She denies any fever, chills, nausea or vomiting. She has resumed smoking since she's been discharged. Today her shortness of breath became quite severe she came to the ER. The patient states she still is on a prednisone taper which is complete her course of by mouth antibiotics. In the ER the patient continues to wheeze despite treatment and the hospitalist have been asked to admit   Review of Systems:  The patient denies anorexia, fever, weight loss,, vision loss, decreased hearing, hoarseness, chest pain, syncope, dyspnea on exertion, wheezing, peripheral edema, balance deficits, hemoptysis, abdominal pain, melena, hematochezia, severe indigestion/heartburn, hematuria, incontinence, genital sores, muscle weakness, suspicious skin lesions, transient blindness, difficulty walking, depression, unusual weight change, abnormal bleeding, enlarged lymph nodes, angioedema, and breast masses.  Past Medical History: Past Medical History  Diagnosis Date  . 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 MSSA (methicillin susceptible Staphylococcus aureus) infection   . 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 Colon cancer ([place])   . Anemia   . Depression   . Hypertension   . Sleep apnea   . Atopic neurodermatitis   . Anxiety   . TIA (transient ischemic attack)   . COPD (chronic obstructive pulmonary disease) ([place])   . RLS (restless legs syndrome)    Past Surgical History  Procedure Laterality Date  . Total hip arthroplasty    . Cholecystectomy    . Colon surgery    . Abdominal hysterectomy    . Revision of total hip arthroplasty    . Resection total  hip    . Joint replacement      Medications: Prior to Admission medications   Medication Sig Start Date End Date Taking? Authorizing Provider  albuterol (PROVENTIL HFA;VENTOLIN HFA) 108 (90 Base) MCG/ACT inhaler Inhale 2 puffs into the lungs every 4 (four) hours as needed for wheezing or shortness of breath. 08/23/15  Yes Henreitta Leber, MD  benazepril (LOTENSIN) 40 MG tablet Take 1 tablet (40 mg total) by mouth daily. 04/20/15  Yes [name], MD  Calcium Carbonate-Vit D-Min (CALCIUM 1200 PO) Take 1 tablet by mouth every evening.   Yes Historical Provider, MD  ferrous sulfate 325 (65 FE) MG tablet Take 650 mg by mouth every evening.    Yes Historical Provider, MD  hydrochlorothiazide (HYDRODIURIL) 25 MG tablet Take 25 mg by mouth every evening.   Yes Historical Provider, MD  metoprolol succinate (TOPROL-XL) 50 MG 24 hr tablet Take 50 mg by mouth every evening. Take with or immediately following a meal.   Yes Historical Provider, MD  naproxen (NAPROSYN) 500 MG tablet Take 500 mg by mouth 2 (two) times daily as needed for mild pain.   Yes Historical Provider, MD  predniSONE (DELTASONE) 10 MG tablet 40 mg po daily for 2 days, 20 mg po daily for 2 days,10 mg po daily for 2 days 11/29/15  Yes Demetrios Loll, MD  rOPINIRole (REQUIP) 3 MG tablet Take 1 tablet (3 mg total) by mouth 4 (four) times daily. 04/20/15  Yes [name], MD  sertraline (ZOLOFT) 100 MG tablet Take 100 mg by mouth every evening.  Yes Historical Provider, MD  fluticasone (FLONASE) 50 MCG/ACT nasal spray Place 1 spray into both nostrils daily as needed for rhinitis.     Historical Provider, MD  ipratropium-albuterol (DUONEB) 0.5-2.5 (3) MG/3ML SOLN Take 3 mLs by nebulization every 6 (six) hours as needed (for wheezing/shortness of breath).    Historical Provider, MD  ondansetron (ZOFRAN ODT) 4 MG disintegrating tablet Take 1 tablet (4 mg total) by mouth every 8 (eight) hours as needed for nausea or vomiting. 08/17/15   Megan Annia Friendly, DO    Allergies:   Allergies  Allergen Reactions  . Hydrocodone-Chlorpheniramine Shortness Of Breath  . Prednisone Shortness Of Breath  . Chlorhexidine Rash  . Methadone Other (See Comments)    Reaction: Altered Mental Status  . Sulfa Antibiotics Other (See Comments)    Reaction:  Unknown   . Benadryl [Diphenhydramine] Anxiety  . Codeine Anxiety  . Diphenhydramine-Zinc Acetate Anxiety    Social History:  reports that she has been smoking Cigarettes.  She started smoking about 51 years ago. She has been smoking about 0.10 packs per day. She has never used smokeless tobacco. She reports that she does not drink alcohol or use illicit drugs.  Family History: Family History  Problem Relation Age of Onset  . Diabetes Sister   . Mental illness Sister   . Cancer Mother   . Stroke Father     Physical Exam: Filed Vitals:   12/02/15 1800  BP: 105/60  Pulse: 80  Temp: 98.7 F (37.1 C)  TempSrc: Oral  Resp: 20  Height: 5\' 2"  (1.575 m)  Weight: 78.019 kg (172 lb)  SpO2: 92%    General:  Alert and oriented times three, well developed and nourished, no acute distress Eyes: PERRLA, pink conjunctiva, no scleral icterus ENT: Moist oral mucosa, neck supple, no thyromegaly Lungs: clear to ascultation, Wheezing throughout, no crackles, no use of accessory muscles Cardiovascular: regular rate and rhythm, no regurgitation, no gallops, no murmurs. No carotid bruits, no JVD Abdomen: soft, positive BS, non-tender, non-distended, no organomegaly, not an acute abdomen GU: not examined Neuro: CN II - XII grossly intact, sensation intact Musculoskeletal: strength 5/5 all extremities, no clubbing, cyanosis or edema Skin: no rash, no subcutaneous crepitation, no decubitus Psych: appropriate patient   Labs on Admission:   Recent Labs  12/02/15 2252  NA 139  K 3.5  CL 100*  CO2 28  GLUCOSE 146*  BUN 31*  CREATININE 0.73  CALCIUM 10.7*   No results for input(s): AST, ALT,  ALKPHOS, BILITOT, PROT, ALBUMIN in the last 72 hours. No results for input(s): LIPASE, AMYLASE in the last 72 hours.  Recent Labs  12/02/15 2252  WBC 10.2  NEUTROABS 8.1*  HGB 14.2  HCT 41.5  MCV 88.5  PLT 258    Recent Labs  12/02/15 2252  TROPONINI <0.03   Invalid input(s): POCBNP No results for input(s): DDIMER in the last 72 hours. No results for input(s): HGBA1C in the last 72 hours. No results for input(s): CHOL, HDL, LDLCALC, TRIG, CHOLHDL, LDLDIRECT in the last 72 hours. No results for input(s): TSH, T4TOTAL, T3FREE, THYROIDAB in the last 72 hours.  Invalid input(s): FREET3 No results for input(s): VITAMINB12, FOLATE, FERRITIN, TIBC, IRON, RETICCTPCT in the last 72 hours.  Micro Results: No results found for this or any previous visit (from the past 240 hour(s)).   Radiological Exams on Admission: Dg Chest 2 View  12/02/2015  CLINICAL DATA:  Shortness of breath EXAM: CHEST  2  VIEW COMPARISON:  11/27/2015 chest radiograph. FINDINGS: Stable cardiomediastinal silhouette with normal heart size. No pneumothorax. No pleural effusion. Mildly hyperinflated lungs. No pulmonary edema. No acute consolidative airspace disease. IMPRESSION: Mildly hyperinflated lungs, suggesting obstructive lung disease. Otherwise no active disease in the chest. Electronically Signed   By: Ilona Sorrel M.D.   On: 12/02/2015 18:35    Assessment/Plan Present on Admission:  . COPD with acute exacerbation ([place]) -admit to MedSurg -Solu-Medrol 80 mg IV every 8 hours, duonebs, oxygen as needed -Tessalon Perles when necessary cough -Patient just completed a course of antibiotics from prior admission  Tobacco abuse -Nicotine patch, patient comes blood tobacco cessation  . History of Colon cancer ([place]) -Stable, in remission  Restless leg syndrome -Stable, resume home medications  . Depression -Stable, resume home medications  . Hypertension -Stable, resume home medications   Jorah Hua,  Martine Bleecker 12/03/2015, 1:06 AM this

## 2015-12-04 DIAGNOSIS — J441 Chronic obstructive pulmonary disease with (acute) exacerbation: Secondary | ICD-10-CM | POA: Diagnosis not present

## 2015-12-04 DIAGNOSIS — I10 Essential (primary) hypertension: Secondary | ICD-10-CM | POA: Diagnosis not present

## 2015-12-04 DIAGNOSIS — G2581 Restless legs syndrome: Secondary | ICD-10-CM | POA: Diagnosis not present

## 2015-12-04 DIAGNOSIS — Z720 Tobacco use: Secondary | ICD-10-CM | POA: Diagnosis not present

## 2015-12-04 MED ORDER — TRAZODONE HCL 100 MG PO TABS
100.0000 mg | ORAL_TABLET | Freq: Every day | ORAL | Status: DC
  Administered 2015-12-04: 100 mg via ORAL
  Filled 2015-12-04: qty 1

## 2015-12-04 MED ORDER — CEPASTAT 14.5 MG MT LOZG
1.0000 | LOZENGE | OROMUCOSAL | Status: DC | PRN
  Administered 2015-12-04: 01:00:00 1 via BUCCAL
  Filled 2015-12-04: qty 9

## 2015-12-04 NOTE — Progress Notes (Signed)
Patient ID: Ann Silva, female   DOB: 02-Jun-1946, 70 y.o.   MRN: [id number] [facility] PROGRESS NOTE  Ann Silva [id number] DOB: [birth year] DOA: 12/02/2015 PCP: Ann Pop, MD  HPI/Subjective: Patient knows she must stop smoking. Still wheezing. She had a rough night last night with a lot of coughing. She is feeling better than when she came in. States she has not slept since she's been on steroids.  Objective: Filed Vitals:   12/04/15 0455 12/04/15 1253  BP: 135/66 134/68  Pulse: 74 81  Temp: 97.7 F (36.5 C) 97.5 F (36.4 C)  Resp:  18    Filed Weights   12/02/15 1800 12/03/15 0357  Weight: 78.019 kg (172 lb) 79.107 kg (174 lb 6.4 oz)    ROS: Review of Systems  Constitutional: Negative for fever and chills.  Eyes: Negative for blurred vision.  Respiratory: Positive for cough, shortness of breath and wheezing.   Cardiovascular: Negative for chest pain.  Gastrointestinal: Negative for nausea, vomiting, abdominal pain, diarrhea and constipation.  Genitourinary: Negative for dysuria.  Musculoskeletal: Negative for joint pain.  Neurological: Negative for dizziness and headaches.   Exam: Physical Exam  Constitutional: She is oriented to person, place, and time.  HENT:  Nose: No mucosal edema.  Mouth/Throat: No oropharyngeal exudate or posterior oropharyngeal edema.  Eyes: Conjunctivae, EOM and lids are normal. Pupils are equal, round, and reactive to light.  Neck: No JVD present. Carotid bruit is not present. No edema present. No thyroid mass and no thyromegaly present.  Cardiovascular: S1 normal and S2 normal.  Exam reveals no gallop.   No murmur heard. Pulses:      Dorsalis pedis pulses are 2+ on the right side, and 2+ on the left side.  Respiratory: No respiratory distress. She has decreased breath sounds in the right lower field and the left lower field. She has wheezes in the right lower field and the left lower field. She has no rhonchi. She has no  rales.  GI: Soft. Bowel sounds are normal. There is no tenderness.  Musculoskeletal:       Right ankle: She exhibits swelling.       Left ankle: She exhibits swelling.  Lymphadenopathy:    She has no cervical adenopathy.  Neurological: She is alert and oriented to person, place, and time. No cranial nerve deficit.  Skin: Skin is warm. No rash noted. Nails show no clubbing.  Psychiatric: She has a normal mood and affect.      Data Reviewed: Basic Metabolic Panel:  Recent Labs Lab 11/28/15 0352 11/28/15 1552 11/29/15 0532 12/02/15 2252  NA 137  --  139 139  K 2.7* 3.5 3.8 3.5  CL 98*  --  98* 100*  CO2 30  --  30 28  GLUCOSE 279*  --  135* 146*  BUN 18  --  37* 31*  CREATININE 0.70  --  0.73 0.73  CALCIUM 9.9  --  10.2 10.7*  MG 1.6*  --  2.0  --    CBC:  Recent Labs Lab 11/28/15 0352 11/29/15 0532 12/02/15 2252  WBC 2.0* 9.8 10.2  NEUTROABS  --   --  8.1*  HGB 13.5 13.9 14.2  HCT 39.3 41.5 41.5  MCV 90.3 91.2 88.5  PLT 128* 163 258   Cardiac Enzymes:  Recent Labs Lab 12/02/15 2252  TROPONINI <0.03    CBG:  Recent Labs Lab 11/28/15 1204 11/28/15 1629 11/28/15 2117 11/29/15 0740  GLUCAP 154* 133* 166* 121*  Studies: Dg Chest 2 View  12/02/2015  CLINICAL DATA:  Shortness of breath EXAM: CHEST  2 VIEW COMPARISON:  11/27/2015 chest radiograph. FINDINGS: Stable cardiomediastinal silhouette with normal heart size. No pneumothorax. No pleural effusion. Mildly hyperinflated lungs. No pulmonary edema. No acute consolidative airspace disease. IMPRESSION: Mildly hyperinflated lungs, suggesting obstructive lung disease. Otherwise no active disease in the chest. Electronically Signed   By: Ilona Sorrel M.D.   On: 12/02/2015 18:35    Scheduled Meds: . azithromycin  250 mg Oral Daily  . benazepril  40 mg Oral Daily  . enoxaparin (LOVENOX) injection  40 mg Subcutaneous Q0600  . hydrochlorothiazide  25 mg Oral QPM  . ipratropium-albuterol  3 mL  Nebulization Q6H  . methylPREDNISolone (SOLU-MEDROL) injection  60 mg Intravenous Q6H  . metoprolol succinate  50 mg Oral QPM  . nicotine  14 mg Transdermal Daily  . nystatin  5 mL Oral QID  . rOPINIRole  3 mg Oral QID  . sertraline  100 mg Oral QPM  . traZODone  100 mg Oral QHS    Assessment/Plan:  1. COPD exacerbation. Decrease Solu-Medrol to 40 mg IV every 8 hours. Patient must stop smoking. Patient coughing when she takes a deep breath. Continue DuoNeb nebulizer solution and added budesonide nebulizers. Patient states she takes Symbicort at home. Zithromax. 2. Tobacco abuse. Nicotine patch ordered 3. Essential hypertension continue hydrochlorothiazide and metoprolol and benazepril 4. Restless leg syndrome on Requip 5. History of colon cancer 6. Insomnia. Trazodone at night.  Code Status:     Code Status Orders        Start     Ordered   12/03/15 0400  Full code   Continuous     12/03/15 0359    Code Status History    Date Active Date Inactive Code Status Order ID Comments User Context   11/27/2015  8:47 PM 11/29/2015  4:08 PM Full Code [id number]  Nicholes Mango, MD Inpatient   08/22/2015 12:44 AM 08/23/2015  3:58 PM Full Code [id number]  Lance Coon, MD Inpatient     Family Communication: Husband Yesterday Disposition Plan: Home once breathing better  Antibiotics:  Zithromax  Time spent: 24 minutes  Ann Silva  [employer]

## 2015-12-04 NOTE — Care Management (Signed)
Admitted to this facility with the diagnosis of COPD. Discharged from this facility last week. Lives with husband, Juanda Crumble [phone]). Last seen Dr, Jeananne Rama 3 weeks ago. Daughter is Wendy [place] [phone]). Suppose to have started [place] with [place] today. [place] [facility] 5-6 years ago. No home oxygen. No falls. Good appetite. Takes care of all basic and instrumental activities of daily living herself, drives. Family will transport. Shelbie Ammons RN MSN CCM Care Management [phone]

## 2015-12-05 DIAGNOSIS — Z720 Tobacco use: Secondary | ICD-10-CM | POA: Diagnosis not present

## 2015-12-05 DIAGNOSIS — G2581 Restless legs syndrome: Secondary | ICD-10-CM | POA: Diagnosis not present

## 2015-12-05 DIAGNOSIS — J441 Chronic obstructive pulmonary disease with (acute) exacerbation: Secondary | ICD-10-CM | POA: Diagnosis not present

## 2015-12-05 DIAGNOSIS — I10 Essential (primary) hypertension: Secondary | ICD-10-CM | POA: Diagnosis not present

## 2015-12-05 LAB — CREATININE, SERUM: Creatinine, Ser: 0.75 mg/dL (ref 0.44–1.00)

## 2015-12-05 LAB — PARATHYROID HORMONE, INTACT (NO CA): PTH: 29 pg/mL (ref 15–65)

## 2015-12-05 MED ORDER — PREDNISONE 5 MG PO TABS: ORAL_TABLET | ORAL | Status: DC

## 2015-12-05 MED ORDER — ALBUTEROL SULFATE HFA 108 (90 BASE) MCG/ACT IN AERS: 2.0000 | INHALATION_SPRAY | RESPIRATORY_TRACT | Status: AC | PRN

## 2015-12-05 MED ORDER — NICOTINE 14 MG/24HR TD PT24: 14.0000 mg | MEDICATED_PATCH | Freq: Every day | TRANSDERMAL | Status: DC

## 2015-12-05 MED ORDER — NYSTATIN 100000 UNIT/ML MT SUSP: 5.0000 mL | Freq: Four times a day (QID) | OROMUCOSAL | Status: DC

## 2015-12-05 MED ORDER — BUDESONIDE-FORMOTEROL FUMARATE 160-4.5 MCG/ACT IN AERO: 2.0000 | INHALATION_SPRAY | Freq: Two times a day (BID) | RESPIRATORY_TRACT | Status: DC

## 2015-12-05 MED ORDER — TRAZODONE HCL 100 MG PO TABS: 100.0000 mg | ORAL_TABLET | Freq: Every day | ORAL | Status: DC

## 2015-12-05 MED ORDER — PREDNISONE 20 MG PO TABS
30.0000 mg | ORAL_TABLET | Freq: Once | ORAL | Status: AC
  Administered 2015-12-05: 09:00:00 30 mg via ORAL
  Filled 2015-12-05: qty 1

## 2015-12-05 MED ORDER — AZITHROMYCIN 250 MG PO TABS: ORAL_TABLET | ORAL | Status: DC

## 2015-12-05 NOTE — Discharge Instructions (Signed)

## 2015-12-05 NOTE — Discharge Summary (Signed)
[facility] NAME: Ann Silva    MR#:  [id number]  DATE OF BIRTH:  1946-01-18  DATE OF ADMISSION:  12/02/2015 ADMITTING PHYSICIAN: Quintella Baton, MD  DATE OF DISCHARGE: 12/05/2015 12:03 PM  PRIMARY CARE PHYSICIAN: Golden Pop, MD    ADMISSION DIAGNOSIS:  COPD exacerbation ([place]) [J44.1]  DISCHARGE DIAGNOSIS:  Active Problems:   Colon cancer (Ann Silva)   Depression   Hypertension   COPD with acute exacerbation ([place])   SECONDARY DIAGNOSIS:   Past Medical History  Diagnosis Date  . 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 MSSA (methicillin susceptible Staphylococcus aureus) infection   . 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 Colon cancer (Ann Silva)   . Anemia   . Depression   . Hypertension   . Sleep apnea   . Atopic neurodermatitis   . Anxiety   . TIA (transient ischemic attack)   . COPD (chronic obstructive pulmonary disease) (Ann Silva)   . RLS (restless legs syndrome)     HOSPITAL COURSE:   1. COPD exacerbation. Patient was placed on Solu-Medrol around-the-clock and switched over to prednisone taper upon discharge home. Finish up Zithromax. Continue Symbicort at home. Albuterol inhaler prescribed also. Patient must stop smoking. 2. Tobacco abuse. Nicotine patch ordered. Patient must stop smoking. 3. Essential hypertension continue hydrochlorothiazide, metoprolol and benazepril. 4. Restless leg syndrome on Requip 5. History of colon cancer 6. Insomnia on trazodone  DISCHARGE CONDITIONS:   Satisfactory  CONSULTS OBTAINED:     DRUG ALLERGIES:   Allergies  Allergen Reactions  . Hydrocodone-Chlorpheniramine Shortness Of Breath  . Prednisone Shortness Of Breath  . Chlorhexidine Rash  . Methadone Other (See Comments)    Reaction: Altered Mental Status  . Sulfa Antibiotics Other (See Comments)    Reaction:  Unknown   . Benadryl [Diphenhydramine] Anxiety  . Codeine Anxiety  . Diphenhydramine-Zinc  Acetate Anxiety    DISCHARGE MEDICATIONS:   Discharge Medication List as of 12/05/2015 10:41 AM    START taking these medications   Details  azithromycin (ZITHROMAX) 250 MG tablet One tab daily until completion, Print    budesonide-formoterol (SYMBICORT) 160-4.5 MCG/ACT inhaler Inhale 2 puffs into the lungs 2 (two) times daily., Starting 12/05/2015, Until Discontinued, Print    nicotine (NICODERM CQ - DOSED IN MG/24 HOURS) 14 mg/24hr patch Place 1 patch (14 mg total) onto the skin daily., Starting 12/05/2015, Until Discontinued, Print    nystatin (MYCOSTATIN) 100000 UNIT/ML suspension Take 5 mLs (500,000 Units total) by mouth 4 (four) times daily., Starting 12/05/2015, Until Discontinued, Print    traZODone (DESYREL) 100 MG tablet Take 1 tablet (100 mg total) by mouth at bedtime., Starting 12/05/2015, Until Discontinued, Print      CONTINUE these medications which have CHANGED   Details  albuterol (PROVENTIL HFA;VENTOLIN HFA) 108 (90 Base) MCG/ACT inhaler Inhale 2 puffs into the lungs every 4 (four) hours as needed for wheezing or shortness of breath., Starting 12/05/2015, Until Discontinued, Print    predniSONE (DELTASONE) 5 MG tablet 4 tabs po day1; 3 tabs po day2; 2 tabs po day3, 1 tab po day4,5,6, Print      CONTINUE these medications which have NOT CHANGED   Details  benazepril (LOTENSIN) 40 MG tablet Take 1 tablet (40 mg total) by mouth daily., Starting 04/20/2015, Until Discontinued, Normal    Calcium Carbonate-Vit D-Min (CALCIUM 1200 PO) Take 1 tablet by mouth every evening., Until Discontinued, Historical Med  ferrous sulfate 325 (65 FE) MG tablet Take 650 mg by mouth every evening. , Until Discontinued, Historical Med    hydrochlorothiazide (HYDRODIURIL) 25 MG tablet Take 25 mg by mouth every evening., Until Discontinued, Historical Med    metoprolol succinate (TOPROL-XL) 50 MG 24 hr tablet Take 50 mg by mouth every evening. Take with or immediately following a meal., Until  Discontinued, Historical Med    naproxen (NAPROSYN) 500 MG tablet Take 500 mg by mouth 2 (two) times daily as needed for mild pain., Until Discontinued, Historical Med    rOPINIRole (REQUIP) 3 MG tablet Take 1 tablet (3 mg total) by mouth 4 (four) times daily., Starting 04/20/2015, Until Discontinued, Normal    sertraline (ZOLOFT) 100 MG tablet Take 100 mg by mouth every evening., Until Discontinued, Historical Med    fluticasone (FLONASE) 50 MCG/ACT nasal spray Place 1 spray into both nostrils daily as needed for rhinitis. , Until Discontinued, Historical Med    ipratropium-albuterol (DUONEB) 0.5-2.5 (3) MG/3ML SOLN Take 3 mLs by nebulization every 6 (six) hours as needed (for wheezing/shortness of breath)., Until Discontinued, Historical Med      STOP taking these medications     ondansetron (ZOFRAN ODT) 4 MG disintegrating tablet          DISCHARGE INSTRUCTIONS:   Follow-up PMD one week  If you experience worsening of your admission symptoms, develop shortness of breath, life threatening emergency, suicidal or homicidal thoughts you must seek medical attention immediately by calling 911 or calling your MD immediately  if symptoms less severe.  You Must read complete instructions/literature along with all the possible adverse reactions/side effects for all the Medicines you take and that have been prescribed to you. Take any new Medicines after you have completely understood and accept all the possible adverse reactions/side effects.   Please note  You were cared for by a hospitalist during your hospital stay. If you have any questions about your discharge medications or the care you received while you were in the hospital after you are discharged, you can call the unit and asked to speak with the hospitalist on call if the hospitalist that took care of you is not available. Once you are discharged, your primary care physician will handle any further medical issues. Please note that  NO REFILLS for any discharge medications will be authorized once you are discharged, as it is imperative that you return to your primary care physician (or establish a relationship with a primary care physician if you do not have one) for your aftercare needs so that they can reassess your need for medications and monitor your lab values.    Today   CHIEF COMPLAINT:   Chief Complaint  Patient presents with  . Shortness of Breath    HISTORY OF PRESENT ILLNESS:  Anaise Belch  is a 70 y.o. female presented back to the hospital with shortness of breath and found to be in COPD exacerbation.   VITAL SIGNS:  Blood pressure 145/87, pulse 74, temperature 97.5 F (36.4 C), temperature source Oral, resp. rate 19, height 5\' 7"  (1.702 m), weight 79.107 kg (174 lb 6.4 oz), SpO2 94 %.    PHYSICAL EXAMINATION:  GENERAL:  70 y.o.-year-old patient lying in the bed with no acute distress.  EYES: Pupils equal, round, reactive to light and accommodation. No scleral icterus. Extraocular muscles intact.  HEENT: Head atraumatic, normocephalic. Oropharynx and nasopharynx clear.  NECK:  Supple, no jugular venous distention. No thyroid enlargement, no tenderness.  LUNGS:  Decreased breath sounds bilaterally bases, no wheezing, rales,rhonchi or crepitation. No use of accessory muscles of respiration.  CARDIOVASCULAR: S1, S2 normal. No murmurs, rubs, or gallops.  ABDOMEN: Soft, non-tender, non-distended. Bowel sounds present. No organomegaly or mass.  EXTREMITIES: No pedal edema, cyanosis, or clubbing.  NEUROLOGIC: Cranial nerves II through XII are intact. Muscle strength 5/5 in all extremities. Sensation intact. Gait not checked.  PSYCHIATRIC: The patient is alert and oriented x 3.  SKIN: No obvious rash, lesion, or ulcer.   DATA REVIEW:   CBC  Recent Labs Lab 12/02/15 2252  WBC 10.2  HGB 14.2  HCT 41.5  PLT 258    Chemistries   Recent Labs Lab 11/29/15 0532 12/02/15 2252 12/05/15 0319   NA 139 139  --   K 3.8 3.5  --   CL 98* 100*  --   CO2 30 28  --   GLUCOSE 135* 146*  --   BUN 37* 31*  --   CREATININE 0.73 0.73 0.75  CALCIUM 10.2 10.7*  --   MG 2.0  --   --     Cardiac Enzymes  Recent Labs Lab 12/02/15 2252  TROPONINI <0.03    Microbiology Results  Results for orders placed or performed in visit on 08/14/15  Influenza a and b     Status: None   Collection Time: 08/14/15  3:14 PM  Result Value Ref Range Status   Influenza A Ag, EIA Negative Negative Final   Influenza B Ag, EIA Negative Negative Final   Influenza Comment See note  Final    Management plans discussed with the patient, And she is in agreement.  CODE STATUS:  Code Status History    Date Active Date Inactive Code Status Order ID Comments User Context   12/03/2015  4:00 AM 12/05/2015  3:24 PM Full Code [id number]  Quintella Baton, MD Inpatient   11/27/2015  8:47 PM 11/29/2015  4:08 PM Full Code [id number]  Nicholes Mango, MD Inpatient   08/22/2015 12:44 AM 08/23/2015  3:58 PM Full Code [id number]  Lance Coon, MD Inpatient      TOTAL TIME TAKING CARE OF THIS PATIENT: 35 minutes.    Loletha Grayer M.D on 12/05/2015 at 5:16 PM  Between 7am to 6pm - Pager - [phone]  After 6pm go to [email or web address] - password [email or web address]  [facility] Office  [phone]  CC: Primary care physician; Golden Pop, MD

## 2015-12-05 NOTE — Telephone Encounter (Signed)
Ann Silva at 12/01/2015 1:29 PM     Status: Signed       Expand All Collapse All   Elmyra Ricks from [facility] would like to get orders for home health to monitor the pts COPD. They would like to get orders to go 2 times a week for 8 weeks.        ok

## 2015-12-05 NOTE — Progress Notes (Signed)
Discharge information given to patient.  Patient verbalized understanding. Prescriptions given to patient.  Patient to be transported home by husband.

## 2015-12-05 NOTE — Care Management (Signed)
Discharge to home today per Dr. Leslye Peer. Will fax resumption of orders to [place]. Husband will transport. Shelbie Ammons RN MSN CCM Care Management [phone]

## 2015-12-05 NOTE — Care Management Important Message (Signed)
Important Message  Patient Details  Name: Ann Silva MRN: [id number] Date of Birth: 1945-11-04   Medicare Important Message Given:  Yes    Juliann Pulse A Emika Tiano 12/05/2015, 10:09 AM

## 2015-12-05 NOTE — Telephone Encounter (Signed)
Verbal order given  

## 2015-12-07 ENCOUNTER — Encounter: Payer: Self-pay | Admitting: Family Medicine

## 2015-12-07 ENCOUNTER — Ambulatory Visit (INDEPENDENT_AMBULATORY_CARE_PROVIDER_SITE_OTHER): Payer: Medicare Other | Admitting: Family Medicine

## 2015-12-07 VITALS — BP 100/65 | HR 65 | Temp 98.0°F | Ht 60.0 in | Wt 177.0 lb

## 2015-12-07 DIAGNOSIS — I10 Essential (primary) hypertension: Secondary | ICD-10-CM | POA: Diagnosis not present

## 2015-12-07 DIAGNOSIS — J441 Chronic obstructive pulmonary disease with (acute) exacerbation: Secondary | ICD-10-CM | POA: Diagnosis not present

## 2015-12-07 NOTE — Progress Notes (Signed)
BP 100/65 mmHg  Pulse 65  Temp(Src) 98 F (36.7 C)  Ht 5' (1.524 m)  Wt 177 lb (80.287 kg)  BMI 34.57 kg/m2  SpO2 99%   Subjective:    Patient ID: Ann Silva, female    DOB: Apr 27, 1946, 70 y.o.   MRN: [id number]  HPI: Ann Silva is a 70 y.o. female  Chief Complaint  Patient presents with  . Hospitalization Follow-up    COPD  . Needs to talk about K level   follow-up hospitalization for COPD doing a little better still weak from being in the hospital and taking prednisone. Breathing is doing okay concerned about inhalers being so expensive. Discussed exploring inhalers that may be less expensive for COPD and breathing.  Discuss low potassium probably caused by hydrochlorothiazide and being sick with blood pressure now low will hold hydrochlorothiazide continue potassium rich foods. May need hydrochlorothiazide back if having problems with edema and/or blood pressure.   Relevant past medical, surgical, family and social history reviewed and updated as indicated. Interim medical history since our last visit reviewed. Allergies and medications reviewed and updated.  Review of Systems  Constitutional: Positive for fatigue. Negative for fever and diaphoresis.  Respiratory: Positive for cough and shortness of breath. Negative for apnea, choking and chest tightness.   Cardiovascular: Negative.     Per HPI unless specifically indicated above     Objective:    BP 100/65 mmHg  Pulse 65  Temp(Src) 98 F (36.7 C)  Ht 5' (1.524 m)  Wt 177 lb (80.287 kg)  BMI 34.57 kg/m2  SpO2 99%  Wt Readings from Last 3 Encounters:  12/07/15 177 lb (80.287 kg)  12/03/15 174 lb 6.4 oz (79.107 kg)  11/27/15 173 lb 8 oz (78.699 kg)    Physical Exam  Constitutional: She is oriented to person, place, and time. She appears well-developed and well-nourished. No distress.  HENT:  Head: Normocephalic and atraumatic.  Right Ear: Hearing normal.  Left Ear: Hearing normal.  Nose: Nose  normal.  Eyes: Conjunctivae and lids are normal. Right eye exhibits no discharge. Left eye exhibits no discharge. No scleral icterus.  Cardiovascular: Normal rate, regular rhythm and normal heart sounds.   Pulmonary/Chest: Effort normal. No respiratory distress.  Poor air movement  Musculoskeletal: Normal range of motion.  Neurological: She is alert and oriented to person, place, and time.  Skin: Skin is intact. No rash noted.  Psychiatric: She has a normal mood and affect. Her speech is normal and behavior is normal. Judgment and thought content normal. Cognition and memory are normal.    Results for orders placed or performed during the hospital encounter of 12/02/15  CBC with Differential  Result Value Ref Range   WBC 10.2 3.6 - 11.0 K/uL   RBC 4.68 3.80 - 5.20 MIL/uL   Hemoglobin 14.2 12.0 - 16.0 g/dL   HCT 41.5 35.0 - 47.0 %   MCV 88.5 80.0 - 100.0 fL   MCH 30.2 26.0 - 34.0 pg   MCHC 34.1 32.0 - 36.0 g/dL   RDW 14.5 11.5 - 14.5 %   Platelets 258 150 - 440 K/uL   Neutrophils Relative % 80% %   Neutro Abs 8.1 (H) 1.4 - 6.5 K/uL   Lymphocytes Relative 16% %   Lymphs Abs 1.6 1.0 - 3.6 K/uL   Monocytes Relative 3% %   Monocytes Absolute 0.3 0.2 - 0.9 K/uL   Eosinophils Relative 1% %   Eosinophils Absolute 0.1 0 - 0.7  K/uL   Basophils Relative 0% %   Basophils Absolute 0.0 0 - 0.1 K/uL  Basic metabolic panel  Result Value Ref Range   Sodium 139 135 - 145 mmol/L   Potassium 3.5 3.5 - 5.1 mmol/L   Chloride 100 (L) 101 - 111 mmol/L   CO2 28 22 - 32 mmol/L   Glucose, Bld 146 (H) 65 - 99 mg/dL   BUN 31 (H) 6 - 20 mg/dL   Creatinine, Ser 0.73 0.44 - 1.00 mg/dL   Calcium 10.7 (H) 8.9 - 10.3 mg/dL   GFR calc non Af Amer >60 >60 mL/min   GFR calc Af Amer >60 >60 mL/min   Anion gap 11 5 - 15  Troponin I  Result Value Ref Range   Troponin I <0.03 <0.031 ng/mL  Parathyroid hormone, intact (no Ca)  Result Value Ref Range   PTH 29 15 - 65 pg/mL  Creatinine, serum  Result Value  Ref Range   Creatinine, Ser 0.75 0.44 - 1.00 mg/dL   GFR calc non Af Amer >60 >60 mL/min   GFR calc Af Amer >60 >60 mL/min      Assessment & Plan:   Problem List Items Addressed This Visit      Cardiovascular and Mediastinum   Hypertension - Primary    Will discontinue hydrochlorothiazide watching blood pressure and edema may need to restart in the meantime potassium rich foods        Respiratory   COPD with acute exacerbation (HCC)    We will finish prednisone for COPD. We'll build to stop the nebulizers when lungs better patient can tell when she needs her treatment now is gone too long without it.           Follow up plan: Return for As scheduled.

## 2015-12-07 NOTE — Assessment & Plan Note (Signed)
We will finish prednisone for COPD. We'll build to stop the nebulizers when lungs better patient can tell when she needs her treatment now is gone too long without it.

## 2015-12-07 NOTE — Assessment & Plan Note (Signed)
Will discontinue hydrochlorothiazide watching blood pressure and edema may need to restart in the meantime potassium rich foods

## 2015-12-11 DIAGNOSIS — G4730 Sleep apnea, unspecified: Secondary | ICD-10-CM | POA: Diagnosis not present

## 2015-12-11 DIAGNOSIS — J441 Chronic obstructive pulmonary disease with (acute) exacerbation: Secondary | ICD-10-CM | POA: Diagnosis not present

## 2015-12-11 DIAGNOSIS — F419 Anxiety disorder, unspecified: Secondary | ICD-10-CM | POA: Diagnosis not present

## 2015-12-11 DIAGNOSIS — F329 Major depressive disorder, single episode, unspecified: Secondary | ICD-10-CM | POA: Diagnosis not present

## 2015-12-11 DIAGNOSIS — G2581 Restless legs syndrome: Secondary | ICD-10-CM | POA: Diagnosis not present

## 2015-12-11 DIAGNOSIS — I10 Essential (primary) hypertension: Secondary | ICD-10-CM | POA: Diagnosis not present

## 2015-12-16 DIAGNOSIS — F329 Major depressive disorder, single episode, unspecified: Secondary | ICD-10-CM | POA: Diagnosis not present

## 2015-12-16 DIAGNOSIS — F419 Anxiety disorder, unspecified: Secondary | ICD-10-CM | POA: Diagnosis not present

## 2015-12-16 DIAGNOSIS — J441 Chronic obstructive pulmonary disease with (acute) exacerbation: Secondary | ICD-10-CM | POA: Diagnosis not present

## 2015-12-16 DIAGNOSIS — G2581 Restless legs syndrome: Secondary | ICD-10-CM | POA: Diagnosis not present

## 2015-12-16 DIAGNOSIS — I10 Essential (primary) hypertension: Secondary | ICD-10-CM | POA: Diagnosis not present

## 2015-12-16 DIAGNOSIS — G4730 Sleep apnea, unspecified: Secondary | ICD-10-CM | POA: Diagnosis not present

## 2015-12-22 DIAGNOSIS — G4730 Sleep apnea, unspecified: Secondary | ICD-10-CM | POA: Diagnosis not present

## 2015-12-22 DIAGNOSIS — F419 Anxiety disorder, unspecified: Secondary | ICD-10-CM | POA: Diagnosis not present

## 2015-12-22 DIAGNOSIS — F329 Major depressive disorder, single episode, unspecified: Secondary | ICD-10-CM | POA: Diagnosis not present

## 2015-12-22 DIAGNOSIS — I10 Essential (primary) hypertension: Secondary | ICD-10-CM | POA: Diagnosis not present

## 2015-12-22 DIAGNOSIS — G2581 Restless legs syndrome: Secondary | ICD-10-CM | POA: Diagnosis not present

## 2015-12-22 DIAGNOSIS — J441 Chronic obstructive pulmonary disease with (acute) exacerbation: Secondary | ICD-10-CM | POA: Diagnosis not present

## 2015-12-26 ENCOUNTER — Telehealth: Payer: Self-pay

## 2015-12-26 DIAGNOSIS — F419 Anxiety disorder, unspecified: Secondary | ICD-10-CM | POA: Diagnosis not present

## 2015-12-26 DIAGNOSIS — G2581 Restless legs syndrome: Secondary | ICD-10-CM | POA: Diagnosis not present

## 2015-12-26 DIAGNOSIS — J441 Chronic obstructive pulmonary disease with (acute) exacerbation: Secondary | ICD-10-CM | POA: Diagnosis not present

## 2015-12-26 DIAGNOSIS — G4730 Sleep apnea, unspecified: Secondary | ICD-10-CM | POA: Diagnosis not present

## 2015-12-26 DIAGNOSIS — F329 Major depressive disorder, single episode, unspecified: Secondary | ICD-10-CM | POA: Diagnosis not present

## 2015-12-26 DIAGNOSIS — I10 Essential (primary) hypertension: Secondary | ICD-10-CM | POA: Diagnosis not present

## 2015-12-26 MED ORDER — METOPROLOL SUCCINATE ER 50 MG PO TB24: 50.0000 mg | ORAL_TABLET | Freq: Every evening | ORAL | Status: DC

## 2015-12-26 NOTE — Telephone Encounter (Signed)
Patient is waiting on meds thru mail order,  Her BP is up and needs short term Rx   Of Metoprolol Succ  1m sent to [facility]

## 2015-12-28 DIAGNOSIS — G2581 Restless legs syndrome: Secondary | ICD-10-CM | POA: Diagnosis not present

## 2015-12-28 DIAGNOSIS — J441 Chronic obstructive pulmonary disease with (acute) exacerbation: Secondary | ICD-10-CM | POA: Diagnosis not present

## 2015-12-28 DIAGNOSIS — G4730 Sleep apnea, unspecified: Secondary | ICD-10-CM | POA: Diagnosis not present

## 2015-12-28 DIAGNOSIS — F419 Anxiety disorder, unspecified: Secondary | ICD-10-CM | POA: Diagnosis not present

## 2015-12-28 DIAGNOSIS — F329 Major depressive disorder, single episode, unspecified: Secondary | ICD-10-CM | POA: Diagnosis not present

## 2015-12-28 DIAGNOSIS — I10 Essential (primary) hypertension: Secondary | ICD-10-CM | POA: Diagnosis not present

## 2016-01-03 DIAGNOSIS — J441 Chronic obstructive pulmonary disease with (acute) exacerbation: Secondary | ICD-10-CM | POA: Diagnosis not present

## 2016-01-03 DIAGNOSIS — F419 Anxiety disorder, unspecified: Secondary | ICD-10-CM | POA: Diagnosis not present

## 2016-01-03 DIAGNOSIS — G4730 Sleep apnea, unspecified: Secondary | ICD-10-CM | POA: Diagnosis not present

## 2016-01-03 DIAGNOSIS — I10 Essential (primary) hypertension: Secondary | ICD-10-CM | POA: Diagnosis not present

## 2016-01-03 DIAGNOSIS — G2581 Restless legs syndrome: Secondary | ICD-10-CM | POA: Diagnosis not present

## 2016-01-03 DIAGNOSIS — F329 Major depressive disorder, single episode, unspecified: Secondary | ICD-10-CM | POA: Diagnosis not present

## 2016-01-04 ENCOUNTER — Ambulatory Visit: Payer: Medicare Other | Admitting: Family Medicine

## 2016-01-04 ENCOUNTER — Telehealth: Payer: Self-pay

## 2016-01-04 DIAGNOSIS — G4730 Sleep apnea, unspecified: Secondary | ICD-10-CM | POA: Diagnosis not present

## 2016-01-04 DIAGNOSIS — G2581 Restless legs syndrome: Secondary | ICD-10-CM | POA: Diagnosis not present

## 2016-01-04 DIAGNOSIS — F419 Anxiety disorder, unspecified: Secondary | ICD-10-CM | POA: Diagnosis not present

## 2016-01-04 DIAGNOSIS — J441 Chronic obstructive pulmonary disease with (acute) exacerbation: Secondary | ICD-10-CM | POA: Diagnosis not present

## 2016-01-04 DIAGNOSIS — F329 Major depressive disorder, single episode, unspecified: Secondary | ICD-10-CM | POA: Diagnosis not present

## 2016-01-04 DIAGNOSIS — I10 Essential (primary) hypertension: Secondary | ICD-10-CM | POA: Diagnosis not present

## 2016-01-04 NOTE — Telephone Encounter (Signed)
Patient to come in for BP check

## 2016-01-08 DIAGNOSIS — G4730 Sleep apnea, unspecified: Secondary | ICD-10-CM | POA: Diagnosis not present

## 2016-01-08 DIAGNOSIS — J441 Chronic obstructive pulmonary disease with (acute) exacerbation: Secondary | ICD-10-CM | POA: Diagnosis not present

## 2016-01-08 DIAGNOSIS — G2581 Restless legs syndrome: Secondary | ICD-10-CM | POA: Diagnosis not present

## 2016-01-08 DIAGNOSIS — F329 Major depressive disorder, single episode, unspecified: Secondary | ICD-10-CM | POA: Diagnosis not present

## 2016-01-08 DIAGNOSIS — F419 Anxiety disorder, unspecified: Secondary | ICD-10-CM | POA: Diagnosis not present

## 2016-01-08 DIAGNOSIS — I10 Essential (primary) hypertension: Secondary | ICD-10-CM | POA: Diagnosis not present

## 2016-01-10 ENCOUNTER — Ambulatory Visit (INDEPENDENT_AMBULATORY_CARE_PROVIDER_SITE_OTHER): Payer: Medicare Other | Admitting: Family Medicine

## 2016-01-10 ENCOUNTER — Encounter: Payer: Self-pay | Admitting: Family Medicine

## 2016-01-10 VITALS — BP 122/83 | HR 84 | Temp 99.1°F | Wt 180.0 lb

## 2016-01-10 DIAGNOSIS — R197 Diarrhea, unspecified: Secondary | ICD-10-CM | POA: Diagnosis not present

## 2016-01-10 DIAGNOSIS — I10 Essential (primary) hypertension: Secondary | ICD-10-CM | POA: Diagnosis not present

## 2016-01-10 LAB — CBC WITH DIFFERENTIAL/PLATELET
HEMATOCRIT: 40.8 % (ref 34.0–46.6)
HEMOGLOBIN: 13.9 g/dL (ref 11.1–15.9)
LYMPHS ABS: 0.5 10*3/uL — AB (ref 0.7–3.1)
Lymphs: 8 %
MCH: 30.8 pg (ref 26.6–33.0)
MCHC: 34.1 g/dL (ref 31.5–35.7)
MCV: 90 fL (ref 79–97)
MID (ABSOLUTE): 0.2 10*3/uL (ref 0.1–1.6)
MID: 4 %
Neutrophils Absolute: 5.4 10*3/uL (ref 1.4–7.0)
Neutrophils: 88 %
Platelets: 195 10*3/uL (ref 150–379)
RBC: 4.52 x10E6/uL (ref 3.77–5.28)
RDW: 14.4 % (ref 12.3–15.4)
WBC: 6.1 10*3/uL (ref 3.4–10.8)

## 2016-01-10 MED ORDER — MAGIC MOUTHWASH: 5.0000 mL | Freq: Three times a day (TID) | ORAL | Status: DC | PRN

## 2016-01-10 MED ORDER — ONDANSETRON 4 MG PO TBDP: 4.0000 mg | ORAL_TABLET | Freq: Three times a day (TID) | ORAL | Status: DC | PRN

## 2016-01-10 NOTE — Patient Instructions (Signed)
Follow up if no improvement in the next few days.

## 2016-01-10 NOTE — Assessment & Plan Note (Signed)
Continue with current BP regimen, including HCTZ, will await electrolyte results. Follow up as scheduled with Dr. Jeananne Rama next month. Continue dietary modification with potassium rich foods

## 2016-01-10 NOTE — Progress Notes (Signed)
BP 122/83 mmHg  Pulse 84  Temp(Src) 99.1 F (37.3 C)  Wt 180 lb (81.647 kg)  SpO2 93%   Subjective:    Patient ID: Ann Silva, female    DOB: 1945-11-13, 70 y.o.   MRN: [id number]  HPI: Ann Silva is a 70 y.o. female  Chief Complaint  Patient presents with  . Hypertension    states it's been high for "quite a while". She has getting PT at home and they check it and she says it's been high but couldn't say how high it was. Dr.Crissman stopped her HCTZ, but she restarted it. She has been under alot of family stress.  . Diarrhea    Woke up this am with diarrhea, achey, headache. Felt like she may have had a fever, did have some cold chills earlier today.    Has been back on HCTZ for about 2 weeks now, PT noticed her BP creeping back up while she was off the medication. BPs doing much better since adding it back on. Still having a fair amount of LE edema b/l with aching in lower legs, but states it has improved some since she got back on the medication. Continuing dietary modifications of eating lots of potassium rich foods.   Started this morning with fever, chills, stomach cramps, diarrhea, headache, chills. Denies blood in stool, localized abdominal pain, or vomiting. Ate at a bbq for fourth of July last night with lots of slaws and cold salads. Has been taking alka seltzer chewables. Also started today with a dry cough. Using albuterol inhaler with good relief. Has not eaten since last night due to nausea.   Relevant past medical, surgical, family and social history reviewed and updated as indicated. Interim medical history since our last visit reviewed. Allergies and medications reviewed and updated.  Review of Systems  Constitutional: Positive for fever, chills and fatigue.  HENT: Negative.   Eyes: Negative.   Respiratory: Positive for cough (dry). Negative for shortness of breath and wheezing.   Cardiovascular: Negative.   Gastrointestinal: Positive for nausea and  diarrhea.  Musculoskeletal: Negative.   Skin: Negative.   Neurological: Negative.   Psychiatric/Behavioral: Negative.     Per HPI unless specifically indicated above     Objective:    BP 122/83 mmHg  Pulse 84  Temp(Src) 99.1 F (37.3 C)  Wt 180 lb (81.647 kg)  SpO2 93%  Wt Readings from Last 3 Encounters:  01/10/16 180 lb (81.647 kg)  12/07/15 177 lb (80.287 kg)  12/03/15 174 lb 6.4 oz (79.107 kg)    Physical Exam  Constitutional: She is oriented to person, place, and time. She appears well-developed and well-nourished.  HENT:  Head: Atraumatic.  Eyes: Conjunctivae are normal. No scleral icterus.  Neck: Normal range of motion. Neck supple.  Cardiovascular: Normal rate, normal heart sounds and intact distal pulses.   Pulmonary/Chest: Effort normal and breath sounds normal. No respiratory distress. She has no wheezes. She has no rales.  Abdominal: Soft. Bowel sounds are normal. She exhibits no distension. There is no tenderness. There is no guarding.  Musculoskeletal: Normal range of motion. Edema: moderate b/l LE pitting edema   Lymphadenopathy:    She has no cervical adenopathy.  Neurological: She is alert and oriented to person, place, and time.  Skin: Skin is warm and dry. No rash noted.  Psychiatric: She has a normal mood and affect. Her behavior is normal.  Nursing note and vitals reviewed.   Results for orders placed  or performed during the hospital encounter of 12/02/15  CBC with Differential  Result Value Ref Range   WBC 10.2 3.6 - 11.0 K/uL   RBC 4.68 3.80 - 5.20 MIL/uL   Hemoglobin 14.2 12.0 - 16.0 g/dL   HCT 41.5 35.0 - 47.0 %   MCV 88.5 80.0 - 100.0 fL   MCH 30.2 26.0 - 34.0 pg   MCHC 34.1 32.0 - 36.0 g/dL   RDW 14.5 11.5 - 14.5 %   Platelets 258 150 - 440 K/uL   Neutrophils Relative % 80% %   Neutro Abs 8.1 (H) 1.4 - 6.5 K/uL   Lymphocytes Relative 16% %   Lymphs Abs 1.6 1.0 - 3.6 K/uL   Monocytes Relative 3% %   Monocytes Absolute 0.3 0.2 - 0.9  K/uL   Eosinophils Relative 1% %   Eosinophils Absolute 0.1 0 - 0.7 K/uL   Basophils Relative 0% %   Basophils Absolute 0.0 0 - 0.1 K/uL  Basic metabolic panel  Result Value Ref Range   Sodium 139 135 - 145 mmol/L   Potassium 3.5 3.5 - 5.1 mmol/L   Chloride 100 (L) 101 - 111 mmol/L   CO2 28 22 - 32 mmol/L   Glucose, Bld 146 (H) 65 - 99 mg/dL   BUN 31 (H) 6 - 20 mg/dL   Creatinine, Ser 0.73 0.44 - 1.00 mg/dL   Calcium 10.7 (H) 8.9 - 10.3 mg/dL   GFR calc non Af Amer >60 >60 mL/min   GFR calc Af Amer >60 >60 mL/min   Anion gap 11 5 - 15  Troponin I  Result Value Ref Range   Troponin I <0.03 <0.031 ng/mL  Parathyroid hormone, intact (no Ca)  Result Value Ref Range   PTH 29 15 - 65 pg/mL  Creatinine, serum  Result Value Ref Range   Creatinine, Ser 0.75 0.44 - 1.00 mg/dL   GFR calc non Af Amer >60 >60 mL/min   GFR calc Af Amer >60 >60 mL/min      Assessment & Plan:   Problem List Items Addressed This Visit      Cardiovascular and Mediastinum   Hypertension - Primary    Continue with current BP regimen, including HCTZ, will await electrolyte results. Follow up as scheduled with Dr. Jeananne Rama next month. Continue dietary modification with potassium rich foods      Relevant Orders   Basic Metabolic Panel (BMET)    Other Visit Diagnoses    Diarrhea, unspecified type        Likely food-related, zofran given to help her tolerate PO intake. Patient aware to call if symptoms worsen or do not improve in the next few days.     Relevant Orders    CBC With Differential/Platelet          Follow up plan: Return if symptoms worsen or fail to improve.

## 2016-01-11 ENCOUNTER — Encounter: Payer: Self-pay | Admitting: Family Medicine

## 2016-01-11 LAB — BASIC METABOLIC PANEL
BUN / CREAT RATIO: 27 (ref 12–28)
BUN: 16 mg/dL (ref 8–27)
CHLORIDE: 98 mmol/L (ref 96–106)
CO2: 24 mmol/L (ref 18–29)
Calcium: 10.5 mg/dL — ABNORMAL HIGH (ref 8.7–10.3)
Creatinine, Ser: 0.6 mg/dL (ref 0.57–1.00)
GFR, EST AFRICAN AMERICAN: 106 mL/min/{1.73_m2} (ref >59)
GFR, EST NON AFRICAN AMERICAN: 92 mL/min/{1.73_m2} (ref >59)
Glucose: 103 mg/dL — ABNORMAL HIGH (ref 65–99)
POTASSIUM: 4.2 mmol/L (ref 3.5–5.2)
SODIUM: 138 mmol/L (ref 134–144)

## 2016-01-18 DIAGNOSIS — L309 Dermatitis, unspecified: Secondary | ICD-10-CM | POA: Diagnosis not present

## 2016-02-28 ENCOUNTER — Telehealth: Payer: Self-pay | Admitting: Family Medicine

## 2016-02-28 NOTE — Telephone Encounter (Signed)
Needs apt meg

## 2016-02-28 NOTE — Telephone Encounter (Signed)
Pt called stated she would like to know if Dr. Jeananne Rama can give her a referral to see Dr. Einar Crow at [facility] to get shots in her back. Please call pt if further information is needed. Thanks.

## 2016-03-06 DIAGNOSIS — Z79899 Other long term (current) drug therapy: Secondary | ICD-10-CM | POA: Diagnosis not present

## 2016-03-06 DIAGNOSIS — L308 Other specified dermatitis: Secondary | ICD-10-CM | POA: Diagnosis not present

## 2016-03-22 DIAGNOSIS — M4806 Spinal stenosis, lumbar region: Secondary | ICD-10-CM | POA: Diagnosis not present

## 2016-03-22 DIAGNOSIS — M5416 Radiculopathy, lumbar region: Secondary | ICD-10-CM | POA: Diagnosis not present

## 2016-03-27 DIAGNOSIS — R350 Frequency of micturition: Secondary | ICD-10-CM | POA: Diagnosis not present

## 2016-03-27 DIAGNOSIS — N3946 Mixed incontinence: Secondary | ICD-10-CM | POA: Diagnosis not present

## 2016-04-03 DIAGNOSIS — L308 Other specified dermatitis: Secondary | ICD-10-CM | POA: Diagnosis not present

## 2016-04-17 DIAGNOSIS — Z6834 Body mass index (BMI) 34.0-34.9, adult: Secondary | ICD-10-CM | POA: Diagnosis not present

## 2016-04-17 DIAGNOSIS — M48062 Spinal stenosis, lumbar region with neurogenic claudication: Secondary | ICD-10-CM | POA: Diagnosis not present

## 2016-04-17 DIAGNOSIS — M545 Low back pain: Secondary | ICD-10-CM | POA: Diagnosis not present

## 2016-04-18 ENCOUNTER — Other Ambulatory Visit: Payer: Self-pay | Admitting: Neurosurgery

## 2016-04-18 DIAGNOSIS — M48062 Spinal stenosis, lumbar region with neurogenic claudication: Secondary | ICD-10-CM

## 2016-04-22 ENCOUNTER — Emergency Department: Payer: Medicare Other

## 2016-04-22 ENCOUNTER — Encounter: Payer: Self-pay | Admitting: Emergency Medicine

## 2016-04-22 ENCOUNTER — Encounter: Payer: Medicare Other | Admitting: Family Medicine

## 2016-04-22 ENCOUNTER — Emergency Department
Admission: EM | Admit: 2016-04-22 | Discharge: 2016-04-22 | Disposition: A | Discharge status: Home / Self Care | Payer: Medicare Other | Attending: Emergency Medicine | Admitting: Emergency Medicine

## 2016-04-22 DIAGNOSIS — Z85038 Personal history of other malignant neoplasm of large intestine: Secondary | ICD-10-CM | POA: Insufficient documentation

## 2016-04-22 DIAGNOSIS — S20212A Contusion of left front wall of thorax, initial encounter: Secondary | ICD-10-CM | POA: Diagnosis not present

## 2016-04-22 DIAGNOSIS — Z87891 Personal history of nicotine dependence: Secondary | ICD-10-CM | POA: Diagnosis not present

## 2016-04-22 DIAGNOSIS — S8002XA Contusion of left knee, initial encounter: Secondary | ICD-10-CM | POA: Insufficient documentation

## 2016-04-22 DIAGNOSIS — W19XXXA Unspecified fall, initial encounter: Secondary | ICD-10-CM

## 2016-04-22 DIAGNOSIS — Z791 Long term (current) use of non-steroidal anti-inflammatories (NSAID): Secondary | ICD-10-CM | POA: Diagnosis not present

## 2016-04-22 DIAGNOSIS — W010XXA Fall on same level from slipping, tripping and stumbling without subsequent striking against object, initial encounter: Secondary | ICD-10-CM | POA: Insufficient documentation

## 2016-04-22 DIAGNOSIS — S299XXA Unspecified injury of thorax, initial encounter: Secondary | ICD-10-CM | POA: Diagnosis not present

## 2016-04-22 DIAGNOSIS — M25552 Pain in left hip: Secondary | ICD-10-CM | POA: Diagnosis not present

## 2016-04-22 DIAGNOSIS — I10 Essential (primary) hypertension: Secondary | ICD-10-CM | POA: Diagnosis not present

## 2016-04-22 DIAGNOSIS — J441 Chronic obstructive pulmonary disease with (acute) exacerbation: Secondary | ICD-10-CM | POA: Diagnosis not present

## 2016-04-22 DIAGNOSIS — Y999 Unspecified external cause status: Secondary | ICD-10-CM | POA: Diagnosis not present

## 2016-04-22 DIAGNOSIS — Z79899 Other long term (current) drug therapy: Secondary | ICD-10-CM | POA: Insufficient documentation

## 2016-04-22 DIAGNOSIS — R079 Chest pain, unspecified: Secondary | ICD-10-CM | POA: Diagnosis not present

## 2016-04-22 DIAGNOSIS — Y9222 Religious institution as the place of occurrence of the external cause: Secondary | ICD-10-CM | POA: Diagnosis not present

## 2016-04-22 DIAGNOSIS — Y939 Activity, unspecified: Secondary | ICD-10-CM | POA: Insufficient documentation

## 2016-04-22 DIAGNOSIS — S7002XA Contusion of left hip, initial encounter: Secondary | ICD-10-CM | POA: Diagnosis not present

## 2016-04-22 DIAGNOSIS — M25562 Pain in left knee: Secondary | ICD-10-CM | POA: Diagnosis not present

## 2016-04-22 MED ORDER — OXYCODONE-ACETAMINOPHEN 5-325 MG PO TABS
1.0000 | ORAL_TABLET | Freq: Once | ORAL | Status: AC
  Administered 2016-04-22: 1 via ORAL
  Filled 2016-04-22: qty 1

## 2016-04-22 MED ORDER — OXYCODONE-ACETAMINOPHEN 5-325 MG PO TABS: 1.0000 | ORAL_TABLET | Freq: Four times a day (QID) | ORAL | 0 refills | Status: DC | PRN

## 2016-04-22 MED ORDER — FENTANYL CITRATE (PF) 100 MCG/2ML IJ SOLN
50.0000 ug | Freq: Once | INTRAMUSCULAR | Status: AC
  Administered 2016-04-22: 50 ug via INTRAMUSCULAR
  Filled 2016-04-22: qty 2

## 2016-04-22 MED ORDER — MELOXICAM 7.5 MG PO TABS: 7.5000 mg | ORAL_TABLET | Freq: Every day | ORAL | 0 refills | Status: DC

## 2016-04-22 NOTE — ED Notes (Signed)
Notified XR to come get the patient in 10 minutes after the Fentanyl has had time to start working.

## 2016-04-22 NOTE — ED Triage Notes (Signed)
Pt presents to ED with c/o of left rib pain after falling down at church this evening. Pt reports "I have a really bad drop foot and it got stuck on the carpet and I fell." Pt denies hitting head or LOC. Pt alert an oriented x 4, no increased work in breathing noted.

## 2016-04-22 NOTE — ED Provider Notes (Signed)
[facility] Emergency Department Provider Note  ____________________________________________  Time seen: Approximately 9:02 PM  I have reviewed the triage vital signs and the nursing notes.   HISTORY  Chief Complaint Fall    HPI Ann Silva is a 70 y.o. female who presents to emergency department complaining of left knee, hip, and rib pain status post mechanical fall. Patient states that she caught her foot, tripping, landing on her knee and hip and ribs. Patient states that she has had multiple surgeries toher left hip. Patient denies any pain to her back. She denies any shortness of breath or difficulty breathing. Patient did not hit her head or lose consciousness. Patient denies any numbness or tingling in any extremity. Patient states that she was able to stand after the incident but has not tried walking. Pain is sharp, severe.   Past Medical History:  Diagnosis Date  . Anemia   . Anxiety   . 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 Atopic neurodermatitis   . Colon cancer ([place])   . COPD (chronic obstructive pulmonary disease) ([place])   . Depression   . Hypertension   . 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 MSSA (methicillin susceptible Staphylococcus aureus) infection   . RLS (restless legs syndrome)   . Sleep apnea   . TIA (transient ischemic attack)     Patient Active Problem List   Diagnosis Date Noted  . COPD with acute exacerbation ([place]) 11/27/2015  . COPD exacerbation ([place]) 08/21/2015  . Restless legs 02/27/2015  . Hypertension   . Colon cancer ([place])   . Depression   . History of repair of hip joint 10/25/2013    Past Surgical History:  Procedure Laterality Date  . ABDOMINAL HYSTERECTOMY    . CHOLECYSTECTOMY    . COLON SURGERY    . JOINT REPLACEMENT    . Resection total hip    . Revision of total hip arthroplasty    . TOTAL HIP ARTHROPLASTY      Prior to Admission medications   Medication Sig Start Date  End Date Taking? Authorizing Provider  albuterol (PROVENTIL HFA;VENTOLIN HFA) 108 (90 Base) MCG/ACT inhaler Inhale 2 puffs into the lungs every 4 (four) hours as needed for wheezing or shortness of breath. 12/05/15   Loletha Grayer, MD  benazepril (LOTENSIN) 40 MG tablet Take 1 tablet (40 mg total) by mouth daily. 04/20/15   [name], MD  budesonide-formoterol (SYMBICORT) 160-4.5 MCG/ACT inhaler Inhale 2 puffs into the lungs 2 (two) times daily. Patient taking differently: Inhale 2 puffs into the lungs 2 (two) times daily. Patient takes only as needed. 12/05/15   Loletha Grayer, MD  Calcium Carbonate-Vit D-Min (CALCIUM 1200 PO) Take 1 tablet by mouth every evening.    Historical Provider, MD  ferrous sulfate 325 (65 FE) MG tablet Take 650 mg by mouth every evening.     Historical Provider, MD  fluticasone (FLONASE) 50 MCG/ACT nasal spray Place 1 spray into both nostrils daily as needed for rhinitis.     Historical Provider, MD  hydrochlorothiazide (HYDRODIURIL) 25 MG tablet Take 25 mg by mouth every evening.    Historical Provider, MD  ipratropium-albuterol (DUONEB) 0.5-2.5 (3) MG/3ML SOLN Take 3 mLs by nebulization every 6 (six) hours as needed (for wheezing/shortness of breath).    Historical Provider, MD  magic mouthwash SOLN Take 5 mLs by mouth 3 (three) times daily as needed for mouth pain. 01/10/16   Lilia Argue  Lane, PA-C  meloxicam (MOBIC) 7.5 MG tablet Take 1 tablet (7.5 mg total) by mouth daily. 04/22/16 04/22/17  Roderic Palau D Cuthriell, PA-C  metoprolol succinate (TOPROL-XL) 50 MG 24 hr tablet Take 1 tablet (50 mg total) by mouth every evening. Take with or immediately following a meal. 12/26/15   [name], MD  naproxen (NAPROSYN) 500 MG tablet Take 500 mg by mouth 2 (two) times daily as needed for mild pain.    Historical Provider, MD  nicotine (NICODERM CQ - DOSED IN MG/24 HOURS) 14 mg/24hr patch Place 1 patch (14 mg total) onto the skin daily. 12/05/15   Loletha Grayer, MD   nystatin (MYCOSTATIN) 100000 UNIT/ML suspension Take 5 mLs (500,000 Units total) by mouth 4 (four) times daily. 12/05/15   Loletha Grayer, MD  ondansetron (ZOFRAN ODT) 4 MG disintegrating tablet Take 1 tablet (4 mg total) by mouth every 8 (eight) hours as needed for nausea or vomiting. 01/10/16   Volney American, PA-C  Ondansetron HCl (ZOFRAN PO) Take by mouth as needed.    Historical Provider, MD  oxyCODONE-acetaminophen (ROXICET) 5-325 MG tablet Take 1 tablet by mouth every 6 (six) hours as needed for severe pain. 04/22/16   Charline Bills Cuthriell, PA-C  rOPINIRole (REQUIP) 3 MG tablet Take 1 tablet (3 mg total) by mouth 4 (four) times daily. 04/20/15   [name], MD  sertraline (ZOLOFT) 100 MG tablet Take 100 mg by mouth every evening.    Historical Provider, MD  traZODone (DESYREL) 100 MG tablet Take 1 tablet (100 mg total) by mouth at bedtime. Patient taking differently: Take 100 mg by mouth at bedtime as needed.  12/05/15   Loletha Grayer, MD    Allergies Hydrocodone-chlorpheniramine; Prednisone; Chlorhexidine; Methadone; Sulfa antibiotics; Benadryl [diphenhydramine]; Codeine; and Diphenhydramine-zinc acetate  Family History  Problem Relation Age of Onset  . Cancer Mother   . Stroke Father   . Diabetes Sister   . Mental illness Sister     Social History Social History  Substance Use Topics  . Smoking status: Former Smoker    Packs/day: 0.10    Types: Cigarettes    Start date: 07/08/1964    Quit date: 12/02/2015  . Smokeless tobacco: Never Used  . Alcohol use No     Review of Systems  Constitutional: No fever/chills Cardiovascular: no chest pain. Respiratory: no cough. No SOB. Musculoskeletal: Positive for left rib pain, left hip pain, left knee pain Skin: Negative for rash, abrasions, lacerations, ecchymosis. Neurological: Negative for headaches, focal weakness or numbness. 10-point ROS otherwise  negative.  ____________________________________________   PHYSICAL EXAM:  VITAL SIGNS: ED Triage Vitals  Enc Vitals Group     BP 04/22/16 1911 (!) 122/57     Pulse Rate 04/22/16 1911 77     Resp 04/22/16 1911 16     Temp 04/22/16 1911 98.5 F (36.9 C)     Temp Source 04/22/16 1911 Oral     SpO2 04/22/16 1911 96 %     Weight 04/22/16 1912 180 lb (81.6 kg)     Height 04/22/16 1912 5\' 1"  (1.549 m)     Head Circumference --      Peak Flow --      Pain Score 04/22/16 1912 8     Pain Loc --      Pain Edu? --      Excl. in [place]? --      Constitutional: Alert and oriented. Well appearing and in no acute distress. Eyes: Conjunctivae are  normal. PERRL. EOMI. Head: Atraumatic. Neck: No stridor.  No cervical spine tenderness to palpation.  Cardiovascular: Normal rate, regular rhythm. Normal S1 and S2.  Good peripheral circulation. Respiratory: Normal respiratory effort without tachypnea or retractions. Lungs CTAB. Good air entry to the bases with no decreased or absent breath sounds. Musculoskeletal: Full range of motion to all extremities. No gross deformities appreciated. No chest wall deformity or paradoxical chest wall movement. No flail segments. Good breath sounds underlying. Patient is diffusely tender to palpation lateral left ribs. No specific point tenderness. No palpable abnormality. No crepitus. Patient does have good range of motion to left hip. Patient is tender to palpate shin over the greater trochanteric region. No palpable abnormality. Full range of motion to left knee. Patient is tender to palpation lateral joint line. No palpable abnormality. Varus, valgus, Lachman's, McMurray's is negative. Sensation intact and equal to the unaffected extremity. Pulses intact bilateral lower extremity's. Neurologic:  Normal speech and language. No gross focal neurologic deficits are appreciated.  Skin:  Skin is warm, dry and intact. No rash noted. Psychiatric: Mood and affect are normal.  Speech and behavior are normal. Patient exhibits appropriate insight and judgement.   ____________________________________________   LABS (all labs ordered are listed, but only abnormal results are displayed)  Labs Reviewed - No data to display ____________________________________________  EKG   ____________________________________________  RADIOLOGY Diamantina Providence Cuthriell, personally viewed and evaluated these images (plain radiographs) as part of my medical decision making, as well as reviewing the written report by the radiologist.  Dg Ribs Unilateral W/chest Left  Result Date: 04/22/2016 CLINICAL DATA:  Recent fall with left chest pain, initial encounter EXAM: LEFT RIBS AND CHEST - 3+ VIEW COMPARISON:  12/02/2015 FINDINGS: Cardiac shadow is within normal limits. The lungs are well aerated bilaterally. No pneumothorax is seen. No acute rib fractures are noted. IMPRESSION: No acute abnormality seen. Electronically Signed   By: Inez Catalina M.D.   On: 04/22/2016 20:51   Dg Knee Complete 4 Views Left  Result Date: 04/22/2016 CLINICAL DATA:  Status post fall, with left knee pain. Initial encounter. EXAM: LEFT KNEE - COMPLETE 4+ VIEW COMPARISON:  Left femur radiographs performed 09/23/2011 FINDINGS: There is no evidence of fracture or dislocation. The joint spaces are preserved. No significant degenerative change is seen; the patellofemoral joint is grossly unremarkable in appearance. No significant joint effusion is seen. Mild chondrocalcinosis is noted. The visualized soft tissues are normal in appearance. IMPRESSION: 1. No evidence of fracture or dislocation. 2. Mild chondrocalcinosis noted. Electronically Signed   By: Garald Balding M.D.   On: 04/22/2016 22:29   Dg Hip Unilat W Or Wo Pelvis 2-3 Views Left  Result Date: 04/22/2016 CLINICAL DATA:  Status post fall, with left hip pain and limited range of motion. Initial encounter. EXAM: DG HIP (WITH OR WITHOUT PELVIS) 2-3V LEFT  COMPARISON:  Pelvis radiographs performed 03/14/2015 FINDINGS: There is no evidence of fracture or dislocation at the left hip. The patient's bilateral hip total arthroplasties are grossly stable in appearance. One of the screws of the left hip arthroplasty extends into the left pelvic sidewall soft tissues. Left-sided cerclage wires are again noted, with suggestion of chronic underlying bony resorption at the proximal left femur. No periprosthetic fracture is identified. Lumbosacral spinal fusion hardware is noted. The sacroiliac joints are grossly unremarkable. The visualized bowel gas pattern is within normal limits. IMPRESSION: No evidence of fracture or dislocation. Bilateral hip total arthroplasties appear grossly stable. Suggestion of underlying chronic  bony resorption at the proximal left femur. Electronically Signed   By: Garald Balding M.D.   On: 04/22/2016 22:27    ____________________________________________    PROCEDURES  Procedure(s) performed:    Procedures    Medications  oxyCODONE-acetaminophen (PERCOCET/ROXICET) 5-325 MG per tablet 1 tablet (not administered)  fentaNYL (SUBLIMAZE) injection 50 mcg (50 mcg Intramuscular Given 04/22/16 2125)     ____________________________________________   INITIAL IMPRESSION / ASSESSMENT AND PLAN / ED COURSE  Pertinent labs & imaging results that were available during my care of the patient were reviewed by me and considered in my medical decision making (see chart for details).  Review of the [place] CSRS was performed in accordance of the [place] prior to dispensing any controlled drugs.  Clinical Course    Patient's diagnosis is consistent with Fall resulting in left rib, hip, knee contusions. X-rays were obtained to evaluate these areas with no indication for osseous normality. Exam is reassuring. Patient is to be given pain medication for at home use. Patient advises that she is able to take NSAIDs that indication. As such, patient  will be prescribed with low-dose Mobic for additional symptom control.. Patient will follow up primary care as needed. Patient is given ED precautions to return to the ED for any worsening or new symptoms.     ____________________________________________  FINAL CLINICAL IMPRESSION(S) / ED DIAGNOSES  Final diagnoses:  Fall, initial encounter  Rib contusion, left, initial encounter  Contusion of left hip, initial encounter  Contusion of left knee, initial encounter      NEW MEDICATIONS STARTED DURING THIS VISIT:  New Prescriptions   MELOXICAM (MOBIC) 7.5 MG TABLET    Take 1 tablet (7.5 mg total) by mouth daily.   OXYCODONE-ACETAMINOPHEN (ROXICET) 5-325 MG TABLET    Take 1 tablet by mouth every 6 (six) hours as needed for severe pain.        This chart was dictated using voice recognition software/Dragon. Despite best efforts to proofread, errors can occur which can change the meaning. Any change was purely unintentional.    Darletta Moll, PA-C 04/22/16 [place], MD 04/23/16 [phone]

## 2016-04-29 ENCOUNTER — Ambulatory Visit
Admission: RE | Admit: 2016-04-29 | Discharge: 2016-04-29 | Disposition: A | Discharge status: Home / Self Care | Payer: Medicare Other | Source: Ambulatory Visit | Attending: Neurosurgery | Admitting: Neurosurgery

## 2016-04-29 ENCOUNTER — Emergency Department: Payer: Medicare Other

## 2016-04-29 ENCOUNTER — Encounter: Payer: Self-pay | Admitting: Emergency Medicine

## 2016-04-29 ENCOUNTER — Emergency Department
Admission: EM | Admit: 2016-04-29 | Discharge: 2016-04-29 | Disposition: A | Discharge status: Home / Self Care | Payer: Medicare Other | Attending: Emergency Medicine | Admitting: Emergency Medicine

## 2016-04-29 DIAGNOSIS — Z85038 Personal history of other malignant neoplasm of large intestine: Secondary | ICD-10-CM | POA: Insufficient documentation

## 2016-04-29 DIAGNOSIS — Z8673 Personal history of transient ischemic attack (TIA), and cerebral infarction without residual deficits: Secondary | ICD-10-CM | POA: Insufficient documentation

## 2016-04-29 DIAGNOSIS — M48062 Spinal stenosis, lumbar region with neurogenic claudication: Secondary | ICD-10-CM

## 2016-04-29 DIAGNOSIS — Z79899 Other long term (current) drug therapy: Secondary | ICD-10-CM | POA: Diagnosis not present

## 2016-04-29 DIAGNOSIS — I10 Essential (primary) hypertension: Secondary | ICD-10-CM | POA: Diagnosis not present

## 2016-04-29 DIAGNOSIS — Z87891 Personal history of nicotine dependence: Secondary | ICD-10-CM | POA: Insufficient documentation

## 2016-04-29 DIAGNOSIS — L03116 Cellulitis of left lower limb: Secondary | ICD-10-CM | POA: Diagnosis not present

## 2016-04-29 DIAGNOSIS — J449 Chronic obstructive pulmonary disease, unspecified: Secondary | ICD-10-CM | POA: Insufficient documentation

## 2016-04-29 DIAGNOSIS — M7989 Other specified soft tissue disorders: Secondary | ICD-10-CM | POA: Diagnosis not present

## 2016-04-29 DIAGNOSIS — R609 Edema, unspecified: Secondary | ICD-10-CM

## 2016-04-29 DIAGNOSIS — R600 Localized edema: Secondary | ICD-10-CM | POA: Diagnosis not present

## 2016-04-29 LAB — CBC
HEMATOCRIT: 39.9 % (ref 35.0–47.0)
HEMOGLOBIN: 13.8 g/dL (ref 12.0–16.0)
MCH: 30.6 pg (ref 26.0–34.0)
MCHC: 34.7 g/dL (ref 32.0–36.0)
MCV: 88.1 fL (ref 80.0–100.0)
Platelets: 200 10*3/uL (ref 150–440)
RBC: 4.52 MIL/uL (ref 3.80–5.20)
RDW: 14.8 % — AB (ref 11.5–14.5)
WBC: 9 10*3/uL (ref 3.6–11.0)

## 2016-04-29 LAB — BASIC METABOLIC PANEL
ANION GAP: 8 (ref 5–15)
BUN: 18 mg/dL (ref 6–20)
CALCIUM: 10.3 mg/dL (ref 8.9–10.3)
CO2: 29 mmol/L (ref 22–32)
Chloride: 102 mmol/L (ref 101–111)
Creatinine, Ser: 0.68 mg/dL (ref 0.44–1.00)
GFR calc non Af Amer: >60 mL/min (ref >60)
GLUCOSE: 123 mg/dL — AB (ref 65–99)
POTASSIUM: 3.9 mmol/L (ref 3.5–5.1)
Sodium: 139 mmol/L (ref 135–145)

## 2016-04-29 LAB — BRAIN NATRIURETIC PEPTIDE: B NATRIURETIC PEPTIDE 5: 56 pg/mL (ref 0.0–100.0)

## 2016-04-29 LAB — TROPONIN I: Troponin I: <0.03 ng/mL (ref <0.03)

## 2016-04-29 MED ORDER — FUROSEMIDE 20 MG PO TABS: 20.0000 mg | ORAL_TABLET | Freq: Every day | ORAL | 0 refills | Status: DC

## 2016-04-29 MED ORDER — CEPHALEXIN 500 MG PO CAPS
500.0000 mg | ORAL_CAPSULE | Freq: Once | ORAL | Status: AC
  Administered 2016-04-29: 500 mg via ORAL
  Filled 2016-04-29: qty 1

## 2016-04-29 MED ORDER — CEPHALEXIN 500 MG PO CAPS: 500.0000 mg | ORAL_CAPSULE | Freq: Two times a day (BID) | ORAL | 0 refills | Status: DC

## 2016-04-29 MED ORDER — FUROSEMIDE 40 MG PO TABS
20.0000 mg | ORAL_TABLET | Freq: Once | ORAL | Status: AC
  Administered 2016-04-29: 20 mg via ORAL
  Filled 2016-04-29: qty 1

## 2016-04-29 NOTE — ED Notes (Signed)
Signature pad not working. 

## 2016-04-29 NOTE — ED Triage Notes (Signed)
Patient ambulatory to triage with steady gait, without difficulty or distress noted; pt reports swelling to LE(s), redness & warmth to left lower leg

## 2016-04-29 NOTE — ED Provider Notes (Signed)
[facility] Emergency Department Provider Note   ____________________________________________    I have reviewed the triage vital signs and the nursing notes.   HISTORY  Chief Complaint Leg Swelling     HPI Ann Silva is a 70 y.o. female who presents with complaints of bilateral lower extremity swelling. She also notes in particular her left leg feels more swollen and it is red near her ankle. She reports it is tender to touch as well. She notes she has been asking her PCP for fluid pills for some time but he has been unwilling to prescribe them for her. She denies shortness of breath. She is able to sleep on her back. No chest pain, no nausea or vomiting. No cough. No fevers.   Past Medical History:  Diagnosis Date  . Anemia   . Anxiety   . 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 Atopic neurodermatitis   . Colon cancer ([place])   . COPD (chronic obstructive pulmonary disease) ([place])   . Depression   . Hypertension   . 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 MSSA (methicillin susceptible Staphylococcus aureus) infection   . RLS (restless legs syndrome)   . Sleep apnea   . TIA (transient ischemic attack)     Patient Active Problem List   Diagnosis Date Noted  . COPD with acute exacerbation ([place]) 11/27/2015  . COPD exacerbation ([place]) 08/21/2015  . Restless legs 02/27/2015  . Hypertension   . Colon cancer ([place])   . Depression   . History of repair of hip joint 10/25/2013    Past Surgical History:  Procedure Laterality Date  . ABDOMINAL HYSTERECTOMY    . CHOLECYSTECTOMY    . COLON SURGERY    . JOINT REPLACEMENT    . Resection total hip    . Revision of total hip arthroplasty    . TOTAL HIP ARTHROPLASTY      Prior to Admission medications   Medication Sig Start Date End Date Taking? Authorizing Provider  albuterol (PROVENTIL HFA;VENTOLIN HFA) 108 (90 Base) MCG/ACT inhaler Inhale 2 puffs into the lungs every 4  (four) hours as needed for wheezing or shortness of breath. 12/05/15   Loletha Grayer, MD  benazepril (LOTENSIN) 40 MG tablet Take 1 tablet (40 mg total) by mouth daily. 04/20/15   [name], MD  budesonide-formoterol (SYMBICORT) 160-4.5 MCG/ACT inhaler Inhale 2 puffs into the lungs 2 (two) times daily. Patient taking differently: Inhale 2 puffs into the lungs 2 (two) times daily. Patient takes only as needed. 12/05/15   Loletha Grayer, MD  Calcium Carbonate-Vit D-Min (CALCIUM 1200 PO) Take 1 tablet by mouth every evening.    Historical Provider, MD  cephALEXin (KEFLEX) 500 MG capsule Take 1 capsule (500 mg total) by mouth 2 (two) times daily. 04/29/16   Lavonia Drafts, MD  ferrous sulfate 325 (65 FE) MG tablet Take 650 mg by mouth every evening.     Historical Provider, MD  fluticasone (FLONASE) 50 MCG/ACT nasal spray Place 1 spray into both nostrils daily as needed for rhinitis.     Historical Provider, MD  furosemide (LASIX) 20 MG tablet Take 1 tablet (20 mg total) by mouth daily. 04/29/16 04/29/17  Lavonia Drafts, MD  hydrochlorothiazide (HYDRODIURIL) 25 MG tablet Take 25 mg by mouth every evening.    Historical Provider, MD  ipratropium-albuterol (DUONEB) 0.5-2.5 (3) MG/3ML SOLN Take 3 mLs by nebulization every 6 (six) hours as needed (for  wheezing/shortness of breath).    Historical Provider, MD  magic mouthwash SOLN Take 5 mLs by mouth 3 (three) times daily as needed for mouth pain. 01/10/16   Volney American, PA-C  meloxicam (MOBIC) 7.5 MG tablet Take 1 tablet (7.5 mg total) by mouth daily. 04/22/16 04/22/17  Roderic Palau D Cuthriell, PA-C  metoprolol succinate (TOPROL-XL) 50 MG 24 hr tablet Take 1 tablet (50 mg total) by mouth every evening. Take with or immediately following a meal. 12/26/15   [name], MD  naproxen (NAPROSYN) 500 MG tablet Take 500 mg by mouth 2 (two) times daily as needed for mild pain.    Historical Provider, MD  nicotine (NICODERM CQ - DOSED IN MG/24 HOURS) 14  mg/24hr patch Place 1 patch (14 mg total) onto the skin daily. 12/05/15   Loletha Grayer, MD  nystatin (MYCOSTATIN) 100000 UNIT/ML suspension Take 5 mLs (500,000 Units total) by mouth 4 (four) times daily. 12/05/15   Loletha Grayer, MD  ondansetron (ZOFRAN ODT) 4 MG disintegrating tablet Take 1 tablet (4 mg total) by mouth every 8 (eight) hours as needed for nausea or vomiting. 01/10/16   Volney American, PA-C  Ondansetron HCl (ZOFRAN PO) Take by mouth as needed.    Historical Provider, MD  oxyCODONE-acetaminophen (ROXICET) 5-325 MG tablet Take 1 tablet by mouth every 6 (six) hours as needed for severe pain. 04/22/16   Charline Bills Cuthriell, PA-C  rOPINIRole (REQUIP) 3 MG tablet Take 1 tablet (3 mg total) by mouth 4 (four) times daily. 04/20/15   [name], MD  sertraline (ZOLOFT) 100 MG tablet Take 100 mg by mouth every evening.    Historical Provider, MD  traZODone (DESYREL) 100 MG tablet Take 1 tablet (100 mg total) by mouth at bedtime. Patient taking differently: Take 100 mg by mouth at bedtime as needed.  12/05/15   Loletha Grayer, MD     Allergies Hydrocodone-chlorpheniramine; Prednisone; Chlorhexidine; Methadone; Sulfa antibiotics; Benadryl [diphenhydramine]; Codeine; and Diphenhydramine-zinc acetate  Family History  Problem Relation Age of Onset  . Cancer Mother   . Stroke Father   . Diabetes Sister   . Mental illness Sister     Social History Social History  Substance Use Topics  . Smoking status: Former Smoker    Packs/day: 0.10    Types: Cigarettes    Start date: 07/08/1964    Quit date: 12/02/2015  . Smokeless tobacco: Never Used  . Alcohol use No    Review of Systems  Constitutional: No fever/chills Eyes: No visual changes.   Cardiovascular: Denies chest pain. Respiratory: Denies shortness of breath.  Genitourinary: Negative for dysuria. Musculoskeletal: Left lower leg pain as above Skin: As above Neurological: Negative for headaches or  weakness  10-point ROS otherwise negative.  ____________________________________________   PHYSICAL EXAM:  VITAL SIGNS: ED Triage Vitals [04/29/16 1927]  Enc Vitals Group     BP 129/79     Pulse Rate 87     Resp 20     Temp 98 F (36.7 C)     Temp Source Oral     SpO2 100 %     Weight 180 lb (81.6 kg)     Height 5\' 1"  (1.549 m)     Head Circumference      Peak Flow      Pain Score 6     Pain Loc      Pain Edu?      Excl. in [place]?     Constitutional: Alert and oriented.  No acute distress. Pleasant and interactive Eyes: Conjunctivae are normal.   Nose: No congestion/rhinnorhea. Mouth/Throat: Mucous membranes are moist.    Cardiovascular: Normal rate, regular rhythm. Grossly normal heart sounds.  Good peripheral circulation. Respiratory: Normal respiratory effort.  No retractions. Lungs CTAB. Gastrointestinal: Soft and nontender. No distention.  No CVA tenderness. Genitourinary: deferred Musculoskeletal: 1+ edema bilaterally..  Warm and well perfused Neurologic:  Normal speech and language. No gross focal neurologic deficits are appreciated.  Skin:  Skin is warm, dry and intact. Mild erythema inferior left anterior lower leg most consistent with cellulitis. Psychiatric: Mood and affect are normal. Speech and behavior are normal.  ____________________________________________   LABS (all labs ordered are listed, but only abnormal results are displayed)  Labs Reviewed  CBC - Abnormal; Notable for the following:       Result Value   RDW 14.8 (*)    All other components within normal limits  BASIC METABOLIC PANEL - Abnormal; Notable for the following:    Glucose, Bld 123 (*)    All other components within normal limits  TROPONIN I  BRAIN NATRIURETIC PEPTIDE   ____________________________________________  EKG  ED ECG REPORT I, Lavonia Drafts, the attending physician, personally viewed and interpreted this ECG.  Date: 04/29/2016 EKG Time: 7:33 PM Rate:  83 Rhythm: normal sinus rhythm QRS Axis: normal Intervals: normal ST/T Wave abnormalities: normal Conduction Disturbances: incomplete right bundle   ____________________________________________  RADIOLOGY  Chest x-ray were all neurovascular congestion, ultrasound negative for DVT ____________________________________________   PROCEDURES  Procedure(s) performed: No    Critical Care performed: No ____________________________________________   INITIAL IMPRESSION / ASSESSMENT AND PLAN / ED COURSE  Pertinent labs & imaging results that were available during my care of the patient were reviewed by me and considered in my medical decision making (see chart for details).  Clinically patient has bilateral chronic edema with likely new cellulitis on the left. We will treat with Keflex by mouth and start her on a brief course of Lasix as well. She will follow-up with her PCP ASAP. She knows to return if worsening symptoms, fevers, chills etc.  Clinical Course   ____________________________________________   FINAL CLINICAL IMPRESSION(S) / ED DIAGNOSES  Final diagnoses:  Peripheral edema  Cellulitis of left lower extremity      NEW MEDICATIONS STARTED DURING THIS VISIT:  New Prescriptions   CEPHALEXIN (KEFLEX) 500 MG CAPSULE    Take 1 capsule (500 mg total) by mouth 2 (two) times daily.   FUROSEMIDE (LASIX) 20 MG TABLET    Take 1 tablet (20 mg total) by mouth daily.     Note:  This document was prepared using Dragon voice recognition software and may include unintentional dictation errors.    Lavonia Drafts, MD 04/29/16 2230

## 2016-05-01 DIAGNOSIS — G2581 Restless legs syndrome: Secondary | ICD-10-CM | POA: Diagnosis not present

## 2016-05-01 DIAGNOSIS — L03116 Cellulitis of left lower limb: Secondary | ICD-10-CM | POA: Diagnosis not present

## 2016-05-01 DIAGNOSIS — J449 Chronic obstructive pulmonary disease, unspecified: Secondary | ICD-10-CM | POA: Insufficient documentation

## 2016-05-01 DIAGNOSIS — R609 Edema, unspecified: Secondary | ICD-10-CM | POA: Diagnosis not present

## 2016-05-01 DIAGNOSIS — Z85038 Personal history of other malignant neoplasm of large intestine: Secondary | ICD-10-CM | POA: Insufficient documentation

## 2016-05-01 DIAGNOSIS — L259 Unspecified contact dermatitis, unspecified cause: Secondary | ICD-10-CM | POA: Insufficient documentation

## 2016-05-08 DIAGNOSIS — L03116 Cellulitis of left lower limb: Secondary | ICD-10-CM | POA: Diagnosis not present

## 2016-05-08 DIAGNOSIS — R600 Localized edema: Secondary | ICD-10-CM | POA: Diagnosis not present

## 2016-05-08 DIAGNOSIS — R0609 Other forms of dyspnea: Secondary | ICD-10-CM | POA: Diagnosis not present

## 2016-05-08 DIAGNOSIS — J449 Chronic obstructive pulmonary disease, unspecified: Secondary | ICD-10-CM | POA: Diagnosis not present

## 2016-05-08 DIAGNOSIS — Z79899 Other long term (current) drug therapy: Secondary | ICD-10-CM | POA: Diagnosis not present

## 2016-05-08 DIAGNOSIS — Z23 Encounter for immunization: Secondary | ICD-10-CM | POA: Diagnosis not present

## 2016-05-09 ENCOUNTER — Ambulatory Visit: Payer: Medicare Other

## 2016-05-10 ENCOUNTER — Emergency Department
Admission: EM | Admit: 2016-05-10 | Discharge: 2016-05-10 | Disposition: A | Discharge status: Home / Self Care | Payer: Medicare Other | Attending: Emergency Medicine | Admitting: Emergency Medicine

## 2016-05-10 ENCOUNTER — Encounter: Payer: Self-pay | Admitting: Emergency Medicine

## 2016-05-10 DIAGNOSIS — R42 Dizziness and giddiness: Secondary | ICD-10-CM | POA: Diagnosis not present

## 2016-05-10 DIAGNOSIS — J441 Chronic obstructive pulmonary disease with (acute) exacerbation: Secondary | ICD-10-CM | POA: Insufficient documentation

## 2016-05-10 DIAGNOSIS — Z79899 Other long term (current) drug therapy: Secondary | ICD-10-CM | POA: Insufficient documentation

## 2016-05-10 DIAGNOSIS — R404 Transient alteration of awareness: Secondary | ICD-10-CM | POA: Diagnosis not present

## 2016-05-10 DIAGNOSIS — R5383 Other fatigue: Secondary | ICD-10-CM | POA: Insufficient documentation

## 2016-05-10 DIAGNOSIS — Z87891 Personal history of nicotine dependence: Secondary | ICD-10-CM | POA: Insufficient documentation

## 2016-05-10 DIAGNOSIS — Z85038 Personal history of other malignant neoplasm of large intestine: Secondary | ICD-10-CM | POA: Insufficient documentation

## 2016-05-10 DIAGNOSIS — R531 Weakness: Secondary | ICD-10-CM | POA: Diagnosis not present

## 2016-05-10 DIAGNOSIS — I10 Essential (primary) hypertension: Secondary | ICD-10-CM | POA: Insufficient documentation

## 2016-05-10 LAB — URINALYSIS COMPLETE WITH MICROSCOPIC (ARMC ONLY)
Bilirubin Urine: NEGATIVE
GLUCOSE, UA: NEGATIVE mg/dL
Hgb urine dipstick: NEGATIVE
Ketones, ur: NEGATIVE mg/dL
Nitrite: NEGATIVE
PROTEIN: NEGATIVE mg/dL
SPECIFIC GRAVITY, URINE: 1.012 (ref 1.005–1.030)
pH: 5 (ref 5.0–8.0)

## 2016-05-10 LAB — CBC
HCT: 41.9 % (ref 35.0–47.0)
Hemoglobin: 14.5 g/dL (ref 12.0–16.0)
MCH: 30.1 pg (ref 26.0–34.0)
MCHC: 34.7 g/dL (ref 32.0–36.0)
MCV: 87 fL (ref 80.0–100.0)
PLATELETS: 251 10*3/uL (ref 150–440)
RBC: 4.81 MIL/uL (ref 3.80–5.20)
RDW: 15.3 % — AB (ref 11.5–14.5)
WBC: 8.2 10*3/uL (ref 3.6–11.0)

## 2016-05-10 LAB — BASIC METABOLIC PANEL
Anion gap: 9 (ref 5–15)
BUN: 24 mg/dL — AB (ref 6–20)
CALCIUM: 11.6 mg/dL — AB (ref 8.9–10.3)
CO2: 31 mmol/L (ref 22–32)
CREATININE: 0.96 mg/dL (ref 0.44–1.00)
Chloride: 96 mmol/L — ABNORMAL LOW (ref 101–111)
GFR calc non Af Amer: 58 mL/min — ABNORMAL LOW (ref >60)
Glucose, Bld: 129 mg/dL — ABNORMAL HIGH (ref 65–99)
Potassium: 3.6 mmol/L (ref 3.5–5.1)
SODIUM: 136 mmol/L (ref 135–145)

## 2016-05-10 LAB — TROPONIN I

## 2016-05-10 NOTE — ED Notes (Signed)
Pt reports she was getting ready to go out to the movies and dinner when she suddenly became weak and sleepy feeling. Pt reports she thinks this may be related to the recent change of fluid medications on Wednesday. Pt was taking HCTZ but now is on lasix 20 mg and aldactone 25 mg.

## 2016-05-10 NOTE — ED Provider Notes (Signed)
[facility] Emergency Department Provider Note ____________________________________________   I have reviewed the triage vital signs and the triage nursing note.  HISTORY  Chief Complaint Weakness and Nausea   Historian Patient  HPI Ann Silva is a 70 y.o. female with a history ofleg edema for which she was recently switched to Lasix and spironolactone by her PCP, today states that she felt like she had no energy and felt lightheaded like she might pass out without palpitations or chest pain or trouble breathing. She's not had a fever, diarrhea or vomiting. She's had a little bit of decreased by mouth intake. Denies urinary symptoms.She thought may be fluid is being pulled off too fast. She was also concerned about possible low potassium level.  No focal weakness, numbness, confusion or altered mental status.    Past Medical History:  Diagnosis Date  . Anemia   . Anxiety   . 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 Atopic neurodermatitis   . Colon cancer ([place])   . COPD (chronic obstructive pulmonary disease) ([place])   . Depression   . Hypertension   . 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 MSSA (methicillin susceptible Staphylococcus aureus) infection   . RLS (restless legs syndrome)   . Sleep apnea   . TIA (transient ischemic attack)     Patient Active Problem List   Diagnosis Date Noted  . COPD with acute exacerbation ([place]) 11/27/2015  . COPD exacerbation ([place]) 08/21/2015  . Restless legs 02/27/2015  . Hypertension   . Colon cancer ([place])   . Depression   . History of repair of hip joint 10/25/2013    Past Surgical History:  Procedure Laterality Date  . ABDOMINAL HYSTERECTOMY    . CHOLECYSTECTOMY    . COLON SURGERY    . JOINT REPLACEMENT    . Resection total hip    . Revision of total hip arthroplasty    . TOTAL HIP ARTHROPLASTY      Prior to Admission medications   Medication Sig Start Date End Date Taking?  Authorizing Provider  albuterol (PROVENTIL HFA;VENTOLIN HFA) 108 (90 Base) MCG/ACT inhaler Inhale 2 puffs into the lungs every 4 (four) hours as needed for wheezing or shortness of breath. 12/05/15   Loletha Grayer, MD  benazepril (LOTENSIN) 40 MG tablet Take 1 tablet (40 mg total) by mouth daily. 04/20/15   [name], MD  budesonide-formoterol (SYMBICORT) 160-4.5 MCG/ACT inhaler Inhale 2 puffs into the lungs 2 (two) times daily. Patient taking differently: Inhale 2 puffs into the lungs 2 (two) times daily. Patient takes only as needed. 12/05/15   Loletha Grayer, MD  Calcium Carbonate-Vit D-Min (CALCIUM 1200 PO) Take 1 tablet by mouth every evening.    Historical Provider, MD  cephALEXin (KEFLEX) 500 MG capsule Take 1 capsule (500 mg total) by mouth 2 (two) times daily. 04/29/16   Lavonia Drafts, MD  ferrous sulfate 325 (65 FE) MG tablet Take 650 mg by mouth every evening.     Historical Provider, MD  fluticasone (FLONASE) 50 MCG/ACT nasal spray Place 1 spray into both nostrils daily as needed for rhinitis.     Historical Provider, MD  furosemide (LASIX) 20 MG tablet Take 1 tablet (20 mg total) by mouth daily. 04/29/16 04/29/17  Lavonia Drafts, MD  hydrochlorothiazide (HYDRODIURIL) 25 MG tablet Take 25 mg by mouth every evening.    Historical Provider, MD  ipratropium-albuterol (DUONEB) 0.5-2.5 (3) MG/3ML SOLN Take 3 mLs by  nebulization every 6 (six) hours as needed (for wheezing/shortness of breath).    Historical Provider, MD  magic mouthwash SOLN Take 5 mLs by mouth 3 (three) times daily as needed for mouth pain. 01/10/16   Volney American, PA-C  meloxicam (MOBIC) 7.5 MG tablet Take 1 tablet (7.5 mg total) by mouth daily. 04/22/16 04/22/17  Roderic Palau D Cuthriell, PA-C  metoprolol succinate (TOPROL-XL) 50 MG 24 hr tablet Take 1 tablet (50 mg total) by mouth every evening. Take with or immediately following a meal. 12/26/15   [name], MD  naproxen (NAPROSYN) 500 MG tablet Take 500 mg by  mouth 2 (two) times daily as needed for mild pain.    Historical Provider, MD  nicotine (NICODERM CQ - DOSED IN MG/24 HOURS) 14 mg/24hr patch Place 1 patch (14 mg total) onto the skin daily. 12/05/15   Loletha Grayer, MD  nystatin (MYCOSTATIN) 100000 UNIT/ML suspension Take 5 mLs (500,000 Units total) by mouth 4 (four) times daily. 12/05/15   Loletha Grayer, MD  ondansetron (ZOFRAN ODT) 4 MG disintegrating tablet Take 1 tablet (4 mg total) by mouth every 8 (eight) hours as needed for nausea or vomiting. 01/10/16   Volney American, PA-C  Ondansetron HCl (ZOFRAN PO) Take by mouth as needed.    Historical Provider, MD  oxyCODONE-acetaminophen (ROXICET) 5-325 MG tablet Take 1 tablet by mouth every 6 (six) hours as needed for severe pain. 04/22/16   Charline Bills Cuthriell, PA-C  rOPINIRole (REQUIP) 3 MG tablet Take 1 tablet (3 mg total) by mouth 4 (four) times daily. 04/20/15   [name], MD  sertraline (ZOLOFT) 100 MG tablet Take 100 mg by mouth every evening.    Historical Provider, MD  traZODone (DESYREL) 100 MG tablet Take 1 tablet (100 mg total) by mouth at bedtime. Patient taking differently: Take 100 mg by mouth at bedtime as needed.  12/05/15   Loletha Grayer, MD    Allergies  Allergen Reactions  . Hydrocodone-Chlorpheniramine Shortness Of Breath  . Prednisone Shortness Of Breath  . Chlorhexidine Rash  . Methadone Other (See Comments)    Reaction: Altered Mental Status  . Sulfa Antibiotics Other (See Comments)    Reaction:  Unknown   . Benadryl [Diphenhydramine] Anxiety  . Codeine Anxiety  . Diphenhydramine-Zinc Acetate Anxiety    Family History  Problem Relation Age of Onset  . Cancer Mother   . Stroke Father   . Diabetes Sister   . Mental illness Sister     Social History Social History  Substance Use Topics  . Smoking status: Former Smoker    Packs/day: 0.10    Types: Cigarettes    Start date: 07/08/1964    Quit date: 12/02/2015  . Smokeless tobacco: Never Used   . Alcohol use No    Review of Systems  Constitutional: Negative for fever. Eyes: Negative for visual changes. ENT: Negative for sore throat. Cardiovascular: Negative for chest pain. Respiratory: Negative for shortness of breath. Gastrointestinal: Negative for abdominal pain, vomiting and diarrhea. Genitourinary: Negative for dysuria. Musculoskeletal: Negative for back pain. Skin: Negative for rash. Neurological: Negative for headache. 10 point Review of Systems otherwise negative ____________________________________________   PHYSICAL EXAM:  VITAL SIGNS: ED Triage Vitals  Enc Vitals Group     BP 05/10/16 1726 116/72     Pulse Rate 05/10/16 1726 70     Resp 05/10/16 1726 16     Temp 05/10/16 1726 98.3 F (36.8 C)     Temp Source 05/10/16  1726 Oral     SpO2 05/10/16 1726 92 %     Weight 05/10/16 1729 178 lb (80.7 kg)     Height 05/10/16 1729 5\' 1"  (1.549 m)     Head Circumference --      Peak Flow --      Pain Score 05/10/16 1754 0     Pain Loc --      Pain Edu? --      Excl. in [place]? --      Constitutional: Alert and oriented. Well appearing and in no distress. HEENT   Head: Normocephalic and atraumatic.      Eyes: Conjunctivae are normal. PERRL. Normal extraocular movements.      Ears:         Nose: No congestion/rhinnorhea.   Mouth/Throat: Mucous membranes are moist.   Neck: No stridor. Cardiovascular/Chest: Normal rate, regular rhythm.  No murmurs, rubs, or gallops. Respiratory: Normal respiratory effort without tachypnea nor retractions. Breath sounds are clear and equal bilaterally. No wheezes/rales/rhonchi. Gastrointestinal: Soft. No distention, no guarding, no rebound. Nontender.    Genitourinary/rectal:Deferred Musculoskeletal: Nontender with normal range of motion in all extremities. No joint effusions.  No lower extremity tenderness.  No edema. Neurologic:  No facial droop.  Normal speech and language. No gross or focal neurologic deficits are  appreciated. Skin:  Skin is warm, dry and intact. No rash noted. Psychiatric: Mood and affect are normal. Speech and behavior are normal. Patient exhibits appropriate insight and judgment.   ____________________________________________  LABS (pertinent positives/negatives)  Labs Reviewed  BASIC METABOLIC PANEL - Abnormal; Notable for the following:       Result Value   Chloride 96 (*)    Glucose, Bld 129 (*)    BUN 24 (*)    Calcium 11.6 (*)    GFR calc non Af Amer 58 (*)    All other components within normal limits  CBC - Abnormal; Notable for the following:    RDW 15.3 (*)    All other components within normal limits  URINALYSIS COMPLETEWITH MICROSCOPIC ([facility] ONLY) - Abnormal; Notable for the following:    Color, Urine YELLOW (*)    APPearance CLEAR (*)    Leukocytes, UA TRACE (*)    Bacteria, UA RARE (*)    Squamous Epithelial / LPF 0-5 (*)    All other components within normal limits  TROPONIN I  CBG MONITORING, ED    ____________________________________________    EKG I, Lisa Roca, MD, the attending physician have personally viewed and interpreted all ECGs.  69 bpm. Normal sinus rhythm. Incomplete RBBB. Normal axis. Nmal ST and T-wave ____________________________________________  RADIOLOGY All Xrays were viewed by me. Imaging interpreted by Radiologist.  None __________________________________________  PROCEDURES  Procedure(s) performed: None  Critical Care performed: None  ____________________________________________   ED COURSE / ASSESSMENT AND PLAN  Pertinent labs & imaging results that were available during my care of the patient were reviewed by me and considered in my medical decision making (see chart for details).   Ms. Mince is here for fatigue after recent fluid pills changed.  Although no renal failure, elevated bun and somewhat increased cr (0.9 from prior as low as 0.6) may be reflective of mild dehydration from fluid management.   No acute electrolyte abnormalities.  No complaints of black or bloody stools, and hg14, and consistent with prior.  No neurologic deficits by history or on exam.  Review of note from Dr. Sabra Heck, on 20mg  lasix bid and spironolactone and recent  addition of zaroxyln.  I will recommend holding zaroxyln for 24 hours and follow up next week with PCP.    CONSULTATIONS:   None   Patient / Family / Caregiver informed of clinical course, medical decision-making process, and agree with plan.   I discussed return precautions, follow-up instructions, and discharge instructions with patient and/or family.   ___________________________________________   FINAL CLINICAL IMPRESSION(S) / ED DIAGNOSES   Final diagnoses:  Fatigue, unspecified type              Note: This dictation was prepared with Dragon dictation. Any transcriptional errors that result from this process are unintentional    Lisa Roca, MD 05/10/16 2209

## 2016-05-10 NOTE — ED Triage Notes (Signed)
Pt presents with reports of generalized weakness that began today. Pt reports nausea began last night. Pt denies vomiting or diarrhea. Pt denies fever at home.

## 2016-05-10 NOTE — Discharge Instructions (Signed)
You were evaluated for fatigue and I suspect may be from mild dehydration due to new fluid management plan.  Hold off on the new medication, zaroxyln for 1 or 2 days until you feel better.  Please make an appointment for next week with your primary doctor.  Return to the emergency department for any worsening symptoms including fever, confusion altered mental status, one-sided weakness or numbness, chest pain, coughing, abdominal pain, or any other symptoms concerning to you.

## 2016-05-13 LAB — URINE CULTURE

## 2016-05-14 DIAGNOSIS — R0609 Other forms of dyspnea: Secondary | ICD-10-CM | POA: Diagnosis not present

## 2016-05-14 NOTE — Progress Notes (Signed)
ED Antimicrobial Stewardship Positive Culture Follow Up   Ann Silva is an 70 y.o. female who presented to [facility] on 05/10/2016 with a chief complaint of  Chief Complaint  Patient presents with  . Weakness  . Nausea    Recent Results (from the past 720 hour(s))  Urine culture     Status: Abnormal   Collection Time: 05/10/16  9:28 PM  Result Value Ref Range Status   Specimen Description URINE, RANDOM  Final   Special Requests NONE  Final   Culture 40,000 COLONIES/mL ENTEROCOCCUS FAECALIS (A)  Final   Report Status 05/13/2016 FINAL  Final   Organism ID, Bacteria ENTEROCOCCUS FAECALIS (A)  Final      Susceptibility   Enterococcus faecalis - MIC*    AMPICILLIN <=2 SENSITIVE Sensitive     LEVOFLOXACIN 0.5 SENSITIVE Sensitive     NITROFURANTOIN <=16 SENSITIVE Sensitive     VANCOMYCIN 2 SENSITIVE Sensitive     * 40,000 COLONIES/mL ENTEROCOCCUS FAECALIS    Patient had chief complaint of fatigue/weakness after a change in diuretic medications. No urinary symptoms. Urine culture grew 40K colonies of enterococcus, pan-sensitive.   Plan per discussion with MD: Call patient to follow up and see how she is feeling. If symptomatic, will call in prescription for nitrofurantoin.  Called mobile number and left voicemail. Called home number with no answer - voicemail was full.  New antibiotic prescription: Macrobid 100 mg PO BID  ED Provider: MD Laurel Dimmer, PharmD, BCPS Clinical Pharmacist 05/14/2016, 2:44 PM

## 2016-05-15 DIAGNOSIS — R609 Edema, unspecified: Secondary | ICD-10-CM | POA: Diagnosis not present

## 2016-05-15 DIAGNOSIS — J449 Chronic obstructive pulmonary disease, unspecified: Secondary | ICD-10-CM | POA: Diagnosis not present

## 2016-05-15 DIAGNOSIS — E538 Deficiency of other specified B group vitamins: Secondary | ICD-10-CM | POA: Insufficient documentation

## 2016-05-21 ENCOUNTER — Encounter: Payer: Self-pay | Admitting: Radiology

## 2016-05-21 ENCOUNTER — Ambulatory Visit
Admission: RE | Admit: 2016-05-21 | Discharge: 2016-05-21 | Disposition: A | Discharge status: Home / Self Care | Payer: Medicare Other | Source: Ambulatory Visit | Attending: Neurosurgery | Admitting: Neurosurgery

## 2016-05-21 DIAGNOSIS — M48062 Spinal stenosis, lumbar region with neurogenic claudication: Secondary | ICD-10-CM | POA: Insufficient documentation

## 2016-05-21 DIAGNOSIS — M8938 Hypertrophy of bone, other site: Secondary | ICD-10-CM | POA: Insufficient documentation

## 2016-05-21 DIAGNOSIS — M47896 Other spondylosis, lumbar region: Secondary | ICD-10-CM | POA: Diagnosis not present

## 2016-05-21 DIAGNOSIS — M5126 Other intervertebral disc displacement, lumbar region: Secondary | ICD-10-CM | POA: Insufficient documentation

## 2016-05-21 DIAGNOSIS — M48061 Spinal stenosis, lumbar region without neurogenic claudication: Secondary | ICD-10-CM | POA: Insufficient documentation

## 2016-05-21 MED ORDER — GADOBENATE DIMEGLUMINE 529 MG/ML IV SOLN
15.0000 mL | Freq: Once | INTRAVENOUS | Status: AC | PRN
  Administered 2016-05-21: 15 mL via INTRAVENOUS

## 2016-06-05 DIAGNOSIS — J449 Chronic obstructive pulmonary disease, unspecified: Secondary | ICD-10-CM | POA: Diagnosis not present

## 2016-06-05 DIAGNOSIS — R609 Edema, unspecified: Secondary | ICD-10-CM | POA: Diagnosis not present

## 2016-06-06 DIAGNOSIS — M48062 Spinal stenosis, lumbar region with neurogenic claudication: Secondary | ICD-10-CM | POA: Diagnosis not present

## 2016-06-10 ENCOUNTER — Other Ambulatory Visit: Payer: Self-pay | Admitting: Neurosurgery

## 2016-07-08 DIAGNOSIS — I2699 Other pulmonary embolism without acute cor pulmonale: Secondary | ICD-10-CM

## 2016-07-08 HISTORY — DX: Other pulmonary embolism without acute cor pulmonale: I26.99

## 2016-07-10 DIAGNOSIS — R609 Edema, unspecified: Secondary | ICD-10-CM | POA: Diagnosis not present

## 2016-07-10 DIAGNOSIS — J449 Chronic obstructive pulmonary disease, unspecified: Secondary | ICD-10-CM | POA: Diagnosis not present

## 2016-07-10 DIAGNOSIS — M48062 Spinal stenosis, lumbar region with neurogenic claudication: Secondary | ICD-10-CM | POA: Diagnosis not present

## 2016-07-10 DIAGNOSIS — I10 Essential (primary) hypertension: Secondary | ICD-10-CM | POA: Diagnosis not present

## 2016-07-16 ENCOUNTER — Encounter (HOSPITAL_COMMUNITY)
Admission: RE | Admit: 2016-07-16 | Discharge: 2016-07-16 | Disposition: A | Discharge status: Home / Self Care | Payer: Medicare Other | Source: Ambulatory Visit | Attending: Neurosurgery | Admitting: Neurosurgery

## 2016-07-16 ENCOUNTER — Encounter (HOSPITAL_COMMUNITY): Payer: Self-pay

## 2016-07-16 DIAGNOSIS — Z01818 Encounter for other preprocedural examination: Secondary | ICD-10-CM | POA: Insufficient documentation

## 2016-07-16 HISTORY — DX: Adverse effect of unspecified anesthetic, initial encounter: T41.45XA

## 2016-07-16 HISTORY — DX: Cardiac murmur, unspecified: R01.1

## 2016-07-16 HISTORY — DX: Panic disorder (episodic paroxysmal anxiety): F41.0

## 2016-07-16 HISTORY — DX: Personal history of Methicillin resistant Staphylococcus aureus infection: Z86.14

## 2016-07-16 HISTORY — DX: Effusion, unspecified joint: M25.40

## 2016-07-16 HISTORY — DX: Other specified disorders of bladder: N32.89

## 2016-07-16 HISTORY — DX: Edema, unspecified: R60.9

## 2016-07-16 HISTORY — DX: Other chronic pain: G89.29

## 2016-07-16 HISTORY — DX: Other complications of anesthesia, initial encounter: T88.59XA

## 2016-07-16 HISTORY — DX: Dorsalgia, unspecified: M54.9

## 2016-07-16 HISTORY — DX: Localized edema: R60.0

## 2016-07-16 HISTORY — DX: Personal history of other diseases of the respiratory system: Z87.09

## 2016-07-16 HISTORY — DX: Restless legs syndrome: G25.81

## 2016-07-16 HISTORY — DX: Nocturia: R35.1

## 2016-07-16 HISTORY — DX: Pain in unspecified joint: M25.50

## 2016-07-16 HISTORY — DX: Unspecified osteoarthritis, unspecified site: M19.90

## 2016-07-16 HISTORY — DX: Personal history of other medical treatment: Z92.89

## 2016-07-16 HISTORY — DX: Insomnia, unspecified: G47.00

## 2016-07-16 HISTORY — DX: Atherosclerotic heart disease of native coronary artery without angina pectoris: I25.10

## 2016-07-16 HISTORY — DX: Unspecified contact dermatitis, unspecified cause: L25.9

## 2016-07-16 LAB — BASIC METABOLIC PANEL
Anion gap: 10 (ref 5–15)
BUN: 23 mg/dL — ABNORMAL HIGH (ref 6–20)
CALCIUM: 11.2 mg/dL — AB (ref 8.9–10.3)
CO2: 28 mmol/L (ref 22–32)
CREATININE: 0.85 mg/dL (ref 0.44–1.00)
Chloride: 101 mmol/L (ref 101–111)
GFR calc non Af Amer: >60 mL/min (ref >60)
Glucose, Bld: 102 mg/dL — ABNORMAL HIGH (ref 65–99)
Potassium: 3.9 mmol/L (ref 3.5–5.1)
SODIUM: 139 mmol/L (ref 135–145)

## 2016-07-16 LAB — SURGICAL PCR SCREEN
MRSA, PCR: NEGATIVE
Staphylococcus aureus: POSITIVE — AB

## 2016-07-16 LAB — CBC
HCT: 40.5 % (ref 36.0–46.0)
Hemoglobin: 13.6 g/dL (ref 12.0–15.0)
MCH: 30.8 pg (ref 26.0–34.0)
MCHC: 33.6 g/dL (ref 30.0–36.0)
MCV: 91.6 fL (ref 78.0–100.0)
PLATELETS: 231 10*3/uL (ref 150–400)
RBC: 4.42 MIL/uL (ref 3.87–5.11)
RDW: 15.7 % — ABNORMAL HIGH (ref 11.5–15.5)
WBC: 7.9 10*3/uL (ref 4.0–10.5)

## 2016-07-16 MED ORDER — CHLORHEXIDINE GLUCONATE CLOTH 2 % EX PADS: 6.0000 | MEDICATED_PAD | Freq: Once | CUTANEOUS | Status: DC

## 2016-07-16 NOTE — Progress Notes (Addendum)
Cardiologist denies  Medical Md is Dr.Mark Sabra Heck with last visit in epic from 07/10/16  Echo report in epic from 2012 and another under care everywhere from 05/14/16 Stress test done > 10 yrs ago  Heart cath done > 8 yrs ago  EKG in epic from 05-11-16  CXR in epic from 04-29-16

## 2016-07-16 NOTE — Pre-Procedure Instructions (Signed)
BREYLYNN HAID  07/16/2016      [facility] [id number], [place] - [address] [place] [address] Phone: [phone] Fax: [facility] - [place], [place] [address] Phone: [phone] Fax: [phone]    Your procedure is scheduled on Thurs, Jan 18 @ 10:15 AM  Report to [facility] Admitting at 8:15 AM  Call this number if you have problems the morning of surgery:  [phone]   Remember:  Do not eat food or drink liquids after midnight.  Take these medicines the morning of surgery with A SIP OF WATER Albuterol<Bring Your Inhaler With You>,Symbicort,Flonase(Fluticasone-if needed),Zofran(Ondansetron-if needed),Pain Pill(if needed),and Requip(Ropinirole)             Stop taking your Mobic a week prior to surgery. No Goody's,BC's,Aleve,Advil,Motrin,Ibuprofen,Fish Oil,or any Herbal Medications.    Do not wear jewelry, make-up or nail polish.  Do not wear lotions, powders,  perfumes, or deoderant.  Do not shave 48 hours prior to surgery.   Do not bring valuables to the hospital.  [facility] is not responsible for any belongings or valuables.  Contacts, dentures or bridgework may not be worn into surgery.  Leave your suitcase in the car.  After surgery it may be brought to your room.  For patients admitted to the hospital, discharge time will be determined by your treatment team.  Patients discharged the day of surgery will not be allowed to drive home.    [facility]  Before surgery, you can play an important role.  Because skin is not sterile, your skin needs to be as free of germs as possible.  You can reduce the number of germs on you skin by washing with CHG (chlorahexidine gluconate) soap before surgery.  CHG is an antiseptic cleaner which kills germs and bonds with the skin to continue killing germs even after  washing.  Please DO NOT use if you have an allergy to CHG or antibacterial soaps.  If your skin becomes reddened/irritated stop using the CHG and inform your nurse when you arrive at Short Stay.  Do not shave (including legs and underarms) for at least 48 hours prior to the first CHG shower.  You may shave your face.  Please follow these instructions carefully:   1.  Shower with CHG Soap the night before surgery and the                                morning of Surgery.  2.  If you choose to wash your hair, wash your hair first as usual with your       normal shampoo.  3.  After you shampoo, rinse your hair and body thoroughly to remove the                      Shampoo.  4.  Use CHG as you would any other liquid soap.  You can apply chg directly       to the skin and wash gently with scrungie or a clean washcloth.  5.  Apply the CHG Soap to your body ONLY FROM THE NECK DOWN.        Do not use on open wounds or open sores.  Avoid contact with your eyes,  ears, mouth and genitals (private parts).  Wash genitals (private parts)       with your normal soap.  6.  Wash thoroughly, paying special attention to the area where your surgery        will be performed.  7.  Thoroughly rinse your body with warm water from the neck down.  8.  DO NOT shower/wash with your normal soap after using and rinsing off       the CHG Soap.  9.  Pat yourself dry with a clean towel.            10.  Wear clean pajamas.            11.  Place clean sheets on your bed the night of your first shower and do not        sleep with pets.  Day of Surgery  Do not apply any lotions/deoderants the morning of surgery.  Please wear clean clothes to the hospital/surgery center.   Please read over the following fact sheets that you were given. Pain Booklet, Coughing and Deep Breathing, MRSA Information and Surgical Site Infection Prevention

## 2016-07-16 NOTE — Progress Notes (Signed)
I called a prescription for Mupirocin ointment to [facility], [address], [place], [place] 

## 2016-07-17 ENCOUNTER — Encounter (HOSPITAL_COMMUNITY): Payer: Self-pay | Admitting: Emergency Medicine

## 2016-07-17 ENCOUNTER — Encounter (HOSPITAL_COMMUNITY): Payer: Self-pay

## 2016-07-17 NOTE — Progress Notes (Signed)
Pt called saying [facility] never received prescription for mupirocin ointment. Spoke with [facility] and no record of prescription. Prescription given for mupirocin and patient to pick up later today.

## 2016-07-17 NOTE — Progress Notes (Signed)
Anesthesia Chart Review:  Pt is a 71 year old female scheduled for L4-5 PLIF, interbody prosthesis, posterior lateral arthrodesis, posterior segmental instrumentation on 07/25/2016 with Newman Pies, MD.   - PCP is Emily Filbert, MD who is aware of upcoming surgery (notes in care everywhere)  PMH includes:  CAD (mild by 2011 cath), HTN, heart murmur, COPD, OSA, peripheral edema, anemia, colon cancer. Current smoker. BMI 34  Anesthesia history includes BP "bottoms out" with anesthesia.   Medications include: albuterol, benazepril, symbicort, bumetanide, iron, lasix, duoneb, metoprolol, spironolactone  Preoperative labs reviewed.    CXR 04/29/16: Mild pulmonary vascular congestion with interstitial prominence without overt pulmonary edema.  EKG 05/10/16: NSR. Possible Left atrial enlargement. Incomplete RBBB.   Echo 05/14/16 (Care everywhere):  - Normal LV systolic function - Normal RV systolic function - Mild TR.  - Trivial AR, MR, PR - No valvular stenosis  Cardiac cath 08/25/09:  - mid LAD 10% stenosis - mid CX 20% stenosis  If no changes, I anticipate pt can proceed with surgery as scheduled.   Willeen Cass, FNP-BC [facility] Short Stay Surgical Center/Anesthesiology Phone: [phone] 07/17/2016 2:07 PM

## 2016-07-25 ENCOUNTER — Encounter (HOSPITAL_COMMUNITY): Admission: RE | Payer: Self-pay | Source: Ambulatory Visit

## 2016-07-25 ENCOUNTER — Inpatient Hospital Stay (HOSPITAL_COMMUNITY): Admission: RE | Admit: 2016-07-25 | Payer: Medicare Other | Source: Ambulatory Visit | Admitting: Neurosurgery

## 2016-07-25 SURGERY — POSTERIOR LUMBAR FUSION 1 LEVEL
Anesthesia: General

## 2016-07-30 LAB — TYPE AND SCREEN
Blood Product Expiration Date: 201801262359
Blood Product Expiration Date: 201801262359
Unit Type and Rh: 6200
Unit Type and Rh: 6200

## 2016-08-05 DIAGNOSIS — E538 Deficiency of other specified B group vitamins: Secondary | ICD-10-CM | POA: Diagnosis not present

## 2016-08-05 DIAGNOSIS — I890 Lymphedema, not elsewhere classified: Secondary | ICD-10-CM | POA: Insufficient documentation

## 2016-08-05 DIAGNOSIS — M48062 Spinal stenosis, lumbar region with neurogenic claudication: Secondary | ICD-10-CM | POA: Diagnosis not present

## 2016-08-05 DIAGNOSIS — F5104 Psychophysiologic insomnia: Secondary | ICD-10-CM | POA: Diagnosis not present

## 2016-08-13 ENCOUNTER — Emergency Department: Payer: Medicare Other

## 2016-08-13 ENCOUNTER — Encounter: Payer: Self-pay | Admitting: Emergency Medicine

## 2016-08-13 ENCOUNTER — Emergency Department
Admission: EM | Admit: 2016-08-13 | Discharge: 2016-08-13 | Disposition: A | Discharge status: Home / Self Care | Payer: Medicare Other | Attending: Emergency Medicine | Admitting: Emergency Medicine

## 2016-08-13 DIAGNOSIS — R0602 Shortness of breath: Secondary | ICD-10-CM | POA: Diagnosis not present

## 2016-08-13 DIAGNOSIS — J449 Chronic obstructive pulmonary disease, unspecified: Secondary | ICD-10-CM | POA: Insufficient documentation

## 2016-08-13 DIAGNOSIS — I2510 Atherosclerotic heart disease of native coronary artery without angina pectoris: Secondary | ICD-10-CM | POA: Insufficient documentation

## 2016-08-13 DIAGNOSIS — L03116 Cellulitis of left lower limb: Secondary | ICD-10-CM | POA: Insufficient documentation

## 2016-08-13 DIAGNOSIS — Z85828 Personal history of other malignant neoplasm of skin: Secondary | ICD-10-CM | POA: Insufficient documentation

## 2016-08-13 DIAGNOSIS — Y999 Unspecified external cause status: Secondary | ICD-10-CM | POA: Insufficient documentation

## 2016-08-13 DIAGNOSIS — Y939 Activity, unspecified: Secondary | ICD-10-CM | POA: Diagnosis not present

## 2016-08-13 DIAGNOSIS — Y929 Unspecified place or not applicable: Secondary | ICD-10-CM | POA: Insufficient documentation

## 2016-08-13 DIAGNOSIS — F17210 Nicotine dependence, cigarettes, uncomplicated: Secondary | ICD-10-CM | POA: Insufficient documentation

## 2016-08-13 DIAGNOSIS — I10 Essential (primary) hypertension: Secondary | ICD-10-CM | POA: Diagnosis not present

## 2016-08-13 DIAGNOSIS — S0990XA Unspecified injury of head, initial encounter: Secondary | ICD-10-CM | POA: Diagnosis not present

## 2016-08-13 DIAGNOSIS — W19XXXA Unspecified fall, initial encounter: Secondary | ICD-10-CM | POA: Insufficient documentation

## 2016-08-13 DIAGNOSIS — R600 Localized edema: Secondary | ICD-10-CM | POA: Diagnosis not present

## 2016-08-13 DIAGNOSIS — L03115 Cellulitis of right lower limb: Secondary | ICD-10-CM | POA: Insufficient documentation

## 2016-08-13 DIAGNOSIS — L03119 Cellulitis of unspecified part of limb: Secondary | ICD-10-CM

## 2016-08-13 DIAGNOSIS — R609 Edema, unspecified: Secondary | ICD-10-CM

## 2016-08-13 LAB — BASIC METABOLIC PANEL
Anion gap: 9 (ref 5–15)
BUN: 21 mg/dL — ABNORMAL HIGH (ref 6–20)
CALCIUM: 11 mg/dL — AB (ref 8.9–10.3)
CO2: 29 mmol/L (ref 22–32)
Chloride: 101 mmol/L (ref 101–111)
Creatinine, Ser: 0.77 mg/dL (ref 0.44–1.00)
GFR calc Af Amer: >60 mL/min (ref >60)
Glucose, Bld: 113 mg/dL — ABNORMAL HIGH (ref 65–99)
POTASSIUM: 4.2 mmol/L (ref 3.5–5.1)
SODIUM: 139 mmol/L (ref 135–145)

## 2016-08-13 LAB — CBC
HEMATOCRIT: 39 % (ref 35.0–47.0)
Hemoglobin: 13.1 g/dL (ref 12.0–16.0)
MCH: 31.1 pg (ref 26.0–34.0)
MCHC: 33.5 g/dL (ref 32.0–36.0)
MCV: 92.8 fL (ref 80.0–100.0)
PLATELETS: 187 10*3/uL (ref 150–440)
RBC: 4.21 MIL/uL (ref 3.80–5.20)
RDW: 15.9 % — AB (ref 11.5–14.5)
WBC: 11.9 10*3/uL — AB (ref 3.6–11.0)

## 2016-08-13 LAB — TROPONIN I

## 2016-08-13 MED ORDER — TRAMADOL HCL 50 MG PO TABS: 50.0000 mg | ORAL_TABLET | Freq: Four times a day (QID) | ORAL | 0 refills | Status: DC | PRN

## 2016-08-13 MED ORDER — CEPHALEXIN 500 MG PO CAPS: 500.0000 mg | ORAL_CAPSULE | Freq: Four times a day (QID) | ORAL | 0 refills | Status: AC

## 2016-08-13 MED ORDER — OXYCODONE-ACETAMINOPHEN 5-325 MG PO TABS
2.0000 | ORAL_TABLET | Freq: Once | ORAL | Status: AC
  Administered 2016-08-13: 2 via ORAL
  Filled 2016-08-13: qty 2

## 2016-08-13 MED ORDER — BUTALBITAL-APAP-CAFFEINE 50-325-40 MG PO TABS
2.0000 | ORAL_TABLET | Freq: Once | ORAL | Status: DC
  Filled 2016-08-13: qty 2

## 2016-08-13 NOTE — ED Provider Notes (Signed)
[facility] Emergency Department Provider Note        Time seen: ----------------------------------------- 9:51 AM on 08/13/2016 -----------------------------------------    I have reviewed the triage vital signs and the nursing notes.   HISTORY  Chief Complaint Leg Swelling and Cellulitis    HPI Ann Silva is a 71 y.o. female who presents with bilateral lower extremity edema since yesterday. Patient claims redness to the right lower extremity and subjective fevers. He says some shortness of breath as well. His right leg is tender to touch and red. He is able to lay flat without any difficulty according to the triage note. Pain is 5 out of 10 in his right leg.Patient also had a recent fall and head injury. Currently she is complaining of headache.   Past Medical History:  Diagnosis Date  . Anemia    takes ferrous sulfate daily  . Anxiety   . Arthritis   . Bladder spasm   . Chronic back pain    spinal stenosis  . Colon cancer ([place])   . Complication of anesthesia    B/P bottoms out  . Contact dermatitis   . COPD (chronic obstructive pulmonary disease) (H[facility])    Albuterol inhaler and DuoNeb as needed  . Coronary artery disease    mild by 08/25/09 cath  . Heart murmur    never had any problems  . History of blood transfusion    no abnormal reaction noted  . History of bronchitis   . History of MRSA infection 6+ yrs ago  . Hypertension    takes Metoprolol daily  . Insomnia    takes Trazodone nightly  . Joint pain   . Joint swelling   . Nocturia   . Panic attacks    takes Effexor daily  . Peripheral edema    takes Lasix daily and takes Bumex every 3 days.Takes Aldactone daily   . Restless leg syndrome    takes Requip daily  . RLS (restless legs syndrome)   . Sleep apnea     Patient Active Problem List   Diagnosis Date Noted  . COPD with acute exacerbation ([place]) 11/27/2015  . COPD exacerbation ([place]) 08/21/2015  . Restless legs  02/27/2015  . Hypertension   . Colon cancer ([place])   . Depression   . History of repair of hip joint 10/25/2013    Past Surgical History:  Procedure Laterality Date  . ABDOMINAL HYSTERECTOMY    . CHOLECYSTECTOMY    . COLON SURGERY     partial colectomy d/t cancer cells  . COLONOSCOPY    . I&D of left hip      x 4  . JOINT REPLACEMENT    . Resection total hip    . Revision of total hip arthroplasty    . right hip replacement    . TOTAL HIP ARTHROPLASTY      Allergies Hydrocodone-chlorpheniramine; Prednisone; Chlorhexidine; Methadone; Sulfa antibiotics; Benadryl [diphenhydramine]; Codeine; and Diphenhydramine-zinc acetate  Social History Social History  Substance Use Topics  . Smoking status: Current Every Day Smoker    Packs/day: 0.75    Years: 51.00    Types: Cigarettes    Start date: 07/08/1964  . Smokeless tobacco: Never Used  . Alcohol use No    Review of Systems Constitutional: Negative for fever. ENT: Positive for facial ecchymosis Cardiovascular: Negative for chest pain. Respiratory: Positive for shortness of breath Gastrointestinal: Negative for abdominal pain, vomiting and diarrhea. Genitourinary: Negative for dysuria. Musculoskeletal: Positive for leg pain  and swelling Skin: Positive for ecchymosis around the right eye Neurological: Negative for headaches, focal weakness or numbness.  10-point ROS otherwise negative.  ____________________________________________   PHYSICAL EXAM:  VITAL SIGNS: ED Triage Vitals [08/13/16 0803]  Enc Vitals Group     BP (!) 126/47     Pulse Rate 76     Resp 18     Temp 98.8 F (37.1 C)     Temp Source Oral     SpO2 96 %     Weight 178 lb (80.7 kg)     Height 5\' 1"  (1.549 m)     Head Circumference      Peak Flow      Pain Score 5     Pain Loc      Pain Edu?      Excl. in [place]?     Constitutional: Alert and oriented. Well appearing and in no distress. Eyes: Conjunctivae are normal. PERRL. Normal extraocular  movements. ENT   Head: Normocephalic, Right periorbital ecchymosis   Nose: No congestion/rhinnorhea.   Mouth/Throat: Mucous membranes are moist.   Neck: No stridor. Cardiovascular: Normal rate, regular rhythm. No murmurs, rubs, or gallops. Respiratory: Normal respiratory effort without tachypnea nor retractions. Breath sounds are clear and equal bilaterally. No wheezes/rales/rhonchi. Gastrointestinal: Soft and nontender. Normal bowel sounds Musculoskeletal: Nontender with normal range of motion in all extremities. Bilateral lower extremity edema with mild erythema Neurologic:  Normal speech and language. No gross focal neurologic deficits are appreciated.  Skin:  Skin is warm, dry and intact. No rash noted. Psychiatric: Mood and affect are normal. Speech and behavior are normal.  ____________________________________________  EKG: Interpreted by me. Sinus rhythm rate of 74 bpm, normal PR interval, normal QRS size, normal QT, incomplete right bundle branch block.  ____________________________________________  ED COURSE:  Pertinent labs & imaging results that were available during my care of the patient were reviewed by me and considered in my medical decision making (see chart for details). Patient is in no distress, we will assess with labs and imaging.   Procedures ____________________________________________   LABS (pertinent positives/negatives)  Labs Reviewed  BASIC METABOLIC PANEL - Abnormal; Notable for the following:       Result Value   Glucose, Bld 113 (*)    BUN 21 (*)    Calcium 11.0 (*)    All other components within normal limits  CBC - Abnormal; Notable for the following:    WBC 11.9 (*)    RDW 15.9 (*)    All other components within normal limits  TROPONIN I    RADIOLOGY Images were viewed by me  CT head, IMPRESSION: Mild frontal atrophy bilaterally. No intracranial mass, hemorrhage, extra-axial fluid collection. No acute infarct. Areas of  paranasal sinus disease. Mild carotid artery calcification.  IMPRESSION: Chronic lung changes without acute pulmonary abnormality noted. Please see above.  Aortic atherosclerosis.  IMPRESSION: No evidence of bilateral lower extremity deep venous thrombosis.  ____________________________________________  FINAL ASSESSMENT AND PLAN  Edema, headache, minor headache injury  Plan: Patient with labs and imaging as dictated above. Patient is in no acute distress, she has evidence of edema and perhaps mild cellulitis in her legs. The rest of her workup is unremarkable. She is stable for outpatient follow-up with her doctor.   Earleen Newport, MD   Note: This note was generated in part or whole with voice recognition software. Voice recognition is usually quite accurate but there are transcription errors that can and very often do  occur. I apologize for any typographical errors that were not detected and corrected.     Earleen Newport, MD 08/13/16 1200

## 2016-08-13 NOTE — ED Triage Notes (Signed)
Pt has had bilateral lower extremity edema since yesterday. C/o redness to RLE and subjective fevers. Afebrile in triage. Has had some shortness of breath as well.  Right posterior leg is warm to touch and red. Is able to lay flat without difficulty.

## 2016-08-14 DIAGNOSIS — F0781 Postconcussional syndrome: Secondary | ICD-10-CM | POA: Diagnosis not present

## 2016-08-14 DIAGNOSIS — G44311 Acute post-traumatic headache, intractable: Secondary | ICD-10-CM | POA: Diagnosis not present

## 2016-08-14 DIAGNOSIS — I700 Atherosclerosis of aorta: Secondary | ICD-10-CM | POA: Diagnosis not present

## 2016-09-09 DIAGNOSIS — L281 Prurigo nodularis: Secondary | ICD-10-CM | POA: Diagnosis not present

## 2016-09-12 DIAGNOSIS — M4316 Spondylolisthesis, lumbar region: Secondary | ICD-10-CM | POA: Diagnosis not present

## 2016-09-12 DIAGNOSIS — Z6834 Body mass index (BMI) 34.0-34.9, adult: Secondary | ICD-10-CM | POA: Diagnosis not present

## 2016-09-12 DIAGNOSIS — I10 Essential (primary) hypertension: Secondary | ICD-10-CM | POA: Diagnosis not present

## 2016-09-24 DIAGNOSIS — Z8614 Personal history of Methicillin resistant Staphylococcus aureus infection: Secondary | ICD-10-CM | POA: Diagnosis not present

## 2016-09-24 DIAGNOSIS — H6983 Other specified disorders of Eustachian tube, bilateral: Secondary | ICD-10-CM | POA: Diagnosis not present

## 2016-10-03 DIAGNOSIS — I890 Lymphedema, not elsewhere classified: Secondary | ICD-10-CM | POA: Diagnosis not present

## 2016-10-03 DIAGNOSIS — M48062 Spinal stenosis, lumbar region with neurogenic claudication: Secondary | ICD-10-CM | POA: Diagnosis not present

## 2016-10-03 DIAGNOSIS — H6983 Other specified disorders of Eustachian tube, bilateral: Secondary | ICD-10-CM | POA: Diagnosis not present

## 2016-10-03 DIAGNOSIS — Z8614 Personal history of Methicillin resistant Staphylococcus aureus infection: Secondary | ICD-10-CM | POA: Diagnosis not present

## 2016-10-22 DIAGNOSIS — L981 Factitial dermatitis: Secondary | ICD-10-CM | POA: Diagnosis not present

## 2016-11-04 DIAGNOSIS — J449 Chronic obstructive pulmonary disease, unspecified: Secondary | ICD-10-CM | POA: Diagnosis not present

## 2016-11-04 DIAGNOSIS — R0602 Shortness of breath: Secondary | ICD-10-CM | POA: Diagnosis not present

## 2016-11-13 ENCOUNTER — Other Ambulatory Visit: Payer: Self-pay | Admitting: Neurosurgery

## 2016-11-26 DIAGNOSIS — J301 Allergic rhinitis due to pollen: Secondary | ICD-10-CM | POA: Diagnosis not present

## 2016-11-26 DIAGNOSIS — H903 Sensorineural hearing loss, bilateral: Secondary | ICD-10-CM | POA: Diagnosis not present

## 2016-11-26 DIAGNOSIS — H6980 Other specified disorders of Eustachian tube, unspecified ear: Secondary | ICD-10-CM | POA: Diagnosis not present

## 2016-12-16 ENCOUNTER — Encounter (HOSPITAL_COMMUNITY): Payer: Self-pay | Admitting: Urology

## 2016-12-16 ENCOUNTER — Encounter (HOSPITAL_COMMUNITY)
Admission: RE | Admit: 2016-12-16 | Discharge: 2016-12-16 | Disposition: A | Discharge status: Home / Self Care | Payer: Medicare Other | Source: Ambulatory Visit | Attending: Neurosurgery | Admitting: Neurosurgery

## 2016-12-16 DIAGNOSIS — M48062 Spinal stenosis, lumbar region with neurogenic claudication: Secondary | ICD-10-CM | POA: Diagnosis not present

## 2016-12-16 DIAGNOSIS — J449 Chronic obstructive pulmonary disease, unspecified: Secondary | ICD-10-CM | POA: Insufficient documentation

## 2016-12-16 DIAGNOSIS — Z01812 Encounter for preprocedural laboratory examination: Secondary | ICD-10-CM | POA: Diagnosis not present

## 2016-12-16 DIAGNOSIS — M7989 Other specified soft tissue disorders: Secondary | ICD-10-CM | POA: Diagnosis not present

## 2016-12-16 DIAGNOSIS — I10 Essential (primary) hypertension: Secondary | ICD-10-CM | POA: Diagnosis not present

## 2016-12-16 DIAGNOSIS — D649 Anemia, unspecified: Secondary | ICD-10-CM | POA: Diagnosis not present

## 2016-12-16 HISTORY — DX: Headache, unspecified: R51.9

## 2016-12-16 HISTORY — DX: Headache: R51

## 2016-12-16 LAB — BASIC METABOLIC PANEL
ANION GAP: 9 (ref 5–15)
BUN: 18 mg/dL (ref 6–20)
CALCIUM: 10.6 mg/dL — AB (ref 8.9–10.3)
CO2: 24 mmol/L (ref 22–32)
CREATININE: 0.79 mg/dL (ref 0.44–1.00)
Chloride: 106 mmol/L (ref 101–111)
GFR calc Af Amer: >60 mL/min (ref >60)
GLUCOSE: 114 mg/dL — AB (ref 65–99)
Potassium: 3.9 mmol/L (ref 3.5–5.1)
Sodium: 139 mmol/L (ref 135–145)

## 2016-12-16 LAB — CBC
HCT: 41.1 % (ref 36.0–46.0)
HEMOGLOBIN: 13.6 g/dL (ref 12.0–15.0)
MCH: 30.8 pg (ref 26.0–34.0)
MCHC: 33.1 g/dL (ref 30.0–36.0)
MCV: 93 fL (ref 78.0–100.0)
Platelets: 225 10*3/uL (ref 150–400)
RBC: 4.42 MIL/uL (ref 3.87–5.11)
RDW: 14.5 % (ref 11.5–15.5)
WBC: 7.2 10*3/uL (ref 4.0–10.5)

## 2016-12-16 LAB — SURGICAL PCR SCREEN
MRSA, PCR: NEGATIVE
STAPHYLOCOCCUS AUREUS: POSITIVE — AB

## 2016-12-16 NOTE — Progress Notes (Signed)
PCP - Emily Filbert Cardiologist - denies Dermatologist- Dr. Sallye Lat in [place], pt with open areas to skin, pt reports that this is "staph" infection that she is being treated for. Levada Dy with anesthesia notified to come see patient. Pt to contact Dr. Phillip Heal regarding inflamed area to buttocks.   Pt reports spending time in the ICU after Hip surgery at [facility] approximately 6-7 years ago. Pt states she has had issues with BP dropping with anesthesia. Anesthesia records and discharge summaries requested from [facility].   Chest x-ray - 08/14/16 EKG - 08/13/16 Stress Test - "long time ago?" ECHO - 05/14/16 Cardiac Cath - 08/25/09  Pt reports OSA but does not wear CPAP   Patient denies shortness of breath, fever, cough and chest pain at PAT appointment   Patient verbalized understanding of instructions that were given to them at the PAT appointment. Patient was also instructed that they will need to review over the PAT instructions again at home before surgery.

## 2016-12-16 NOTE — Pre-Procedure Instructions (Signed)
    Ann Silva  12/16/2016      [facility] [address], [place] - [place] [address] Phone: [phone] Fax: [facility] - [place], [place] [address] Phone: [phone] Fax: [phone]    Your procedure is scheduled on Wednesday June 20.  Report to [facility] Admitting at 11:15 A.M.  Call this number if you have problems the morning of surgery:  [phone]   Remember:  Do not eat food or drink liquids after midnight.  Take these medicines the morning of surgery with A SIP OF WATER: metoprolol (toprol-XL), ropinirole (requip), zofran if needed, tramadol (ultram) if needed, albuterol if needed, symbicort, duoneb if needed (please bring inhalers to hospital with you)  7 days prior to surgery STOP taking any Aspirin, Aleve, Naproxen, Ibuprofen, Motrin, Advil, Goody's, BC's, all herbal medications, fish oil, and all vitamins    Do not wear jewelry, make-up or nail polish.  Do not wear lotions, powders, or perfumes, or deoderant.  Do not shave 48 hours prior to surgery.  Men may shave face and neck.  Do not bring valuables to the hospital.  [facility] is not responsible for any belongings or valuables.  Contacts, dentures or bridgework may not be worn into surgery.  Leave your suitcase in the car.  After surgery it may be brought to your room.  For patients admitted to the hospital, discharge time will be determined by your treatment team.  Patients discharged the day of surgery will not be allowed to drive home.   Special instructions:    [place]- Preparing For Surgery  Before surgery, you can play an important role. Because skin is not sterile, your skin needs to be as free of germs as possible.   Please follow these instructions carefully.     1. Shower the NIGHT BEFORE SURGERY and the MORNING OF SURGERY with ANTIBACTERIAL  SOAP or SOAP that you are not allergic to.  2. Pat yourself dry with a CLEAN TOWEL.   3. Wear CLEAN PAJAMAS   4. Place CLEAN SHEETS on your bed the night of your first shower and DO NOT SLEEP WITH PETS.    Day of Surgery: Do not apply any deodorants/lotions. Please wear clean clothes to the hospital/surgery center.      Please read over the following fact sheets that you were given. MRSA Information

## 2016-12-17 DIAGNOSIS — J449 Chronic obstructive pulmonary disease, unspecified: Secondary | ICD-10-CM | POA: Diagnosis not present

## 2016-12-17 DIAGNOSIS — I890 Lymphedema, not elsewhere classified: Secondary | ICD-10-CM | POA: Diagnosis not present

## 2016-12-18 NOTE — Progress Notes (Signed)
Anesthesia Chart Review:  Pt is a 71 year old female scheduled for L4-5 PLIF, interbody prosthesis, posterior lateral arthrodesis, posterior segmental instrumentation on 12/25/2016 with Newman Pies, MD  Surgery was originally scheduled for January 2018 but was cancelled by patient because she felt she had a staph infection.   - PCP is Emily Filbert, MD who cleared pt for surgery at last office visit 12/17/16  PMH includes:  CAD (mild by 2011 cath), HTN, heart murmur, COPD, OSA, peripheral edema, anemia, colon cancer, factitious skin disorder. Current smoker. BMI 35  Anesthesia history includes BP "bottoms out" with anesthesia.   Medications include: albuterol, benazepril, symbicort, bumetanide, iron, lasix, duoneb, metoprolol, spironolactone  Preoperative labs reviewed.    CXR 08/13/16: Chronic lung changes without acute pulmonary abnormality noted. Aortic atherosclerosis.  EKG 08/13/16: NSR. Incomplete right bundle branch block   Echo 05/14/16 (Care everywhere):  - Normal LV systolic function - Normal RV systolic function - Mild TR.  - Trivial AR, MR, PR - No valvular stenosis  Cardiac cath 08/25/09:  - mid LAD 10% stenosis - mid CX 20% stenosis  I was called to see pt in PAT for pt reports of recurrent "staph infections".  Pt tells me she cancelled her surgery back when it was originally scheduled 07/2016.  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  She sees a dermatologist for this.  On exam, pt has multiple small 3-46mm scabs on her skin.  None look like a staph infection, none appear infected except for one on her R buttocks.  This lesion is a 1cm nodule that is tender to palpation, erythematous.  I instructed pt to f/u with her dermatologist to get treatment and notified Nikki in Dr. Arnoldo Morale' office.    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  Notes indicate pt has been instructed that she is  causing her skin problems, and to abstain from scratching at skin to cause lesions.   If R buttock skin lesion cleared up, I anticipate pt can proceed as scheduled.   Willeen Cass, FNP-BC [facility] Short Stay Surgical Center/Anesthesiology Phone: [phone] 12/18/2016 11:00 AM   If no changes, I anticipate pt can proceed with surgery as scheduled.

## 2016-12-24 MED ORDER — CEFAZOLIN SODIUM-DEXTROSE 2-4 GM/100ML-% IV SOLN
2.0000 g | INTRAVENOUS | Status: AC
  Administered 2016-12-25 (×2): 2 g via INTRAVENOUS
  Filled 2016-12-24: qty 100

## 2016-12-25 ENCOUNTER — Inpatient Hospital Stay (HOSPITAL_COMMUNITY): Payer: Medicare Other | Admitting: Emergency Medicine

## 2016-12-25 ENCOUNTER — Encounter (HOSPITAL_COMMUNITY): Payer: Self-pay | Admitting: *Deleted

## 2016-12-25 ENCOUNTER — Inpatient Hospital Stay (HOSPITAL_COMMUNITY): Payer: Medicare Other | Admitting: Anesthesiology

## 2016-12-25 ENCOUNTER — Inpatient Hospital Stay (HOSPITAL_COMMUNITY)
Admission: RE | Admit: 2016-12-25 | Discharge: 2016-12-26 | DRG: 455 | Disposition: A | Discharge status: Home Health | Payer: Medicare Other | Source: Ambulatory Visit | Attending: Neurosurgery | Admitting: Neurosurgery

## 2016-12-25 ENCOUNTER — Inpatient Hospital Stay (HOSPITAL_COMMUNITY)
Admission: RE | Disposition: A | Discharge status: Home Health | Payer: Self-pay | Source: Ambulatory Visit | Attending: Neurosurgery

## 2016-12-25 ENCOUNTER — Inpatient Hospital Stay (HOSPITAL_COMMUNITY): Payer: Medicare Other

## 2016-12-25 DIAGNOSIS — G4700 Insomnia, unspecified: Secondary | ICD-10-CM | POA: Diagnosis not present

## 2016-12-25 DIAGNOSIS — Z823 Family history of stroke: Secondary | ICD-10-CM

## 2016-12-25 DIAGNOSIS — Z90710 Acquired absence of both cervix and uterus: Secondary | ICD-10-CM | POA: Diagnosis not present

## 2016-12-25 DIAGNOSIS — F17210 Nicotine dependence, cigarettes, uncomplicated: Secondary | ICD-10-CM | POA: Diagnosis present

## 2016-12-25 DIAGNOSIS — I10 Essential (primary) hypertension: Secondary | ICD-10-CM | POA: Diagnosis not present

## 2016-12-25 DIAGNOSIS — Z419 Encounter for procedure for purposes other than remedying health state, unspecified: Secondary | ICD-10-CM

## 2016-12-25 DIAGNOSIS — Z9049 Acquired absence of other specified parts of digestive tract: Secondary | ICD-10-CM | POA: Diagnosis not present

## 2016-12-25 DIAGNOSIS — G2581 Restless legs syndrome: Secondary | ICD-10-CM | POA: Diagnosis not present

## 2016-12-25 DIAGNOSIS — F418 Other specified anxiety disorders: Secondary | ICD-10-CM | POA: Diagnosis not present

## 2016-12-25 DIAGNOSIS — M4327 Fusion of spine, lumbosacral region: Secondary | ICD-10-CM | POA: Diagnosis not present

## 2016-12-25 DIAGNOSIS — Z882 Allergy status to sulfonamides status: Secondary | ICD-10-CM

## 2016-12-25 DIAGNOSIS — M1990 Unspecified osteoarthritis, unspecified site: Secondary | ICD-10-CM | POA: Diagnosis present

## 2016-12-25 DIAGNOSIS — Z85038 Personal history of other malignant neoplasm of large intestine: Secondary | ICD-10-CM | POA: Diagnosis not present

## 2016-12-25 DIAGNOSIS — Z6834 Body mass index (BMI) 34.0-34.9, adult: Secondary | ICD-10-CM | POA: Diagnosis not present

## 2016-12-25 DIAGNOSIS — Z96643 Presence of artificial hip joint, bilateral: Secondary | ICD-10-CM | POA: Diagnosis present

## 2016-12-25 DIAGNOSIS — F410 Panic disorder [episodic paroxysmal anxiety] without agoraphobia: Secondary | ICD-10-CM | POA: Diagnosis present

## 2016-12-25 DIAGNOSIS — D649 Anemia, unspecified: Secondary | ICD-10-CM | POA: Diagnosis present

## 2016-12-25 DIAGNOSIS — Z818 Family history of other mental and behavioral disorders: Secondary | ICD-10-CM

## 2016-12-25 DIAGNOSIS — M545 Low back pain: Secondary | ICD-10-CM | POA: Diagnosis not present

## 2016-12-25 DIAGNOSIS — M4696 Unspecified inflammatory spondylopathy, lumbar region: Secondary | ICD-10-CM | POA: Diagnosis not present

## 2016-12-25 DIAGNOSIS — Z885 Allergy status to narcotic agent status: Secondary | ICD-10-CM

## 2016-12-25 DIAGNOSIS — Z888 Allergy status to other drugs, medicaments and biological substances status: Secondary | ICD-10-CM

## 2016-12-25 DIAGNOSIS — Z981 Arthrodesis status: Secondary | ICD-10-CM

## 2016-12-25 DIAGNOSIS — G8929 Other chronic pain: Secondary | ICD-10-CM | POA: Diagnosis present

## 2016-12-25 DIAGNOSIS — Z8614 Personal history of Methicillin resistant Staphylococcus aureus infection: Secondary | ICD-10-CM | POA: Diagnosis not present

## 2016-12-25 DIAGNOSIS — M48062 Spinal stenosis, lumbar region with neurogenic claudication: Principal | ICD-10-CM | POA: Diagnosis present

## 2016-12-25 DIAGNOSIS — Z809 Family history of malignant neoplasm, unspecified: Secondary | ICD-10-CM

## 2016-12-25 DIAGNOSIS — G9520 Unspecified cord compression: Secondary | ICD-10-CM | POA: Diagnosis not present

## 2016-12-25 DIAGNOSIS — M4316 Spondylolisthesis, lumbar region: Secondary | ICD-10-CM | POA: Diagnosis not present

## 2016-12-25 DIAGNOSIS — J449 Chronic obstructive pulmonary disease, unspecified: Secondary | ICD-10-CM | POA: Diagnosis present

## 2016-12-25 DIAGNOSIS — J40 Bronchitis, not specified as acute or chronic: Secondary | ICD-10-CM | POA: Diagnosis present

## 2016-12-25 DIAGNOSIS — Z791 Long term (current) use of non-steroidal anti-inflammatories (NSAID): Secondary | ICD-10-CM

## 2016-12-25 DIAGNOSIS — M5136 Other intervertebral disc degeneration, lumbar region: Secondary | ICD-10-CM

## 2016-12-25 DIAGNOSIS — I2510 Atherosclerotic heart disease of native coronary artery without angina pectoris: Secondary | ICD-10-CM | POA: Diagnosis not present

## 2016-12-25 DIAGNOSIS — M5116 Intervertebral disc disorders with radiculopathy, lumbar region: Secondary | ICD-10-CM | POA: Diagnosis present

## 2016-12-25 DIAGNOSIS — Z79899 Other long term (current) drug therapy: Secondary | ICD-10-CM

## 2016-12-25 DIAGNOSIS — Z833 Family history of diabetes mellitus: Secondary | ICD-10-CM

## 2016-12-25 DIAGNOSIS — G4730 Sleep apnea, unspecified: Secondary | ICD-10-CM | POA: Diagnosis present

## 2016-12-25 LAB — TYPE AND SCREEN
ABO/RH(D): A POS
Antibody Screen: NEGATIVE

## 2016-12-25 SURGERY — POSTERIOR LUMBAR FUSION 1 LEVEL
Anesthesia: General

## 2016-12-25 MED ORDER — CYCLOBENZAPRINE HCL 10 MG PO TABS
10.0000 mg | ORAL_TABLET | Freq: Three times a day (TID) | ORAL | Status: DC | PRN
  Administered 2016-12-26: 10 mg via ORAL
  Filled 2016-12-25: qty 1

## 2016-12-25 MED ORDER — CEFAZOLIN SODIUM-DEXTROSE 2-4 GM/100ML-% IV SOLN
2.0000 g | Freq: Three times a day (TID) | INTRAVENOUS | Status: AC
  Administered 2016-12-26 (×2): 2 g via INTRAVENOUS
  Filled 2016-12-25 (×2): qty 100

## 2016-12-25 MED ORDER — MEPERIDINE HCL 25 MG/ML IJ SOLN: 6.2500 mg | INTRAMUSCULAR | Status: DC | PRN

## 2016-12-25 MED ORDER — SPIRONOLACTONE 25 MG PO TABS
25.0000 mg | ORAL_TABLET | Freq: Every day | ORAL | Status: DC
  Filled 2016-12-25: qty 1

## 2016-12-25 MED ORDER — BUPIVACAINE-EPINEPHRINE (PF) 0.5% -1:200000 IJ SOLN
INTRAMUSCULAR | Status: DC | PRN
  Administered 2016-12-25: 10 mL via PERINEURAL

## 2016-12-25 MED ORDER — IPRATROPIUM-ALBUTEROL 0.5-2.5 (3) MG/3ML IN SOLN: 3.0000 mL | Freq: Four times a day (QID) | RESPIRATORY_TRACT | Status: DC | PRN

## 2016-12-25 MED ORDER — FENTANYL CITRATE (PF) 250 MCG/5ML IJ SOLN
INTRAMUSCULAR | Status: DC | PRN
  Administered 2016-12-25: 100 ug via INTRAVENOUS
  Administered 2016-12-25: 50 ug via INTRAVENOUS

## 2016-12-25 MED ORDER — PROPOFOL 10 MG/ML IV BOLUS
INTRAVENOUS | Status: AC
  Filled 2016-12-25: qty 20

## 2016-12-25 MED ORDER — SODIUM CHLORIDE 0.9 % IR SOLN
Status: DC | PRN
  Administered 2016-12-25: 13:00:00

## 2016-12-25 MED ORDER — DEXAMETHASONE SODIUM PHOSPHATE 10 MG/ML IJ SOLN
INTRAMUSCULAR | Status: DC | PRN
Start: 2016-12-25 — End: 2016-12-25
  Administered 2016-12-25: 10 mg via INTRAVENOUS

## 2016-12-25 MED ORDER — SUGAMMADEX SODIUM 500 MG/5ML IV SOLN
INTRAVENOUS | Status: AC
  Filled 2016-12-25: qty 5

## 2016-12-25 MED ORDER — LIDOCAINE 2% (20 MG/ML) 5 ML SYRINGE
INTRAMUSCULAR | Status: AC
  Filled 2016-12-25: qty 5

## 2016-12-25 MED ORDER — ZOLPIDEM TARTRATE 5 MG PO TABS: 5.0000 mg | ORAL_TABLET | Freq: Every evening | ORAL | Status: DC | PRN

## 2016-12-25 MED ORDER — BACITRACIN ZINC 500 UNIT/GM EX OINT
TOPICAL_OINTMENT | CUTANEOUS | Status: AC
  Filled 2016-12-25: qty 28.35

## 2016-12-25 MED ORDER — HYDROMORPHONE HCL 1 MG/ML IJ SOLN
INTRAMUSCULAR | Status: AC
  Administered 2016-12-25: 0.5 mg via INTRAVENOUS
  Filled 2016-12-25: qty 0.5

## 2016-12-25 MED ORDER — BUPIVACAINE LIPOSOME 1.3 % IJ SUSP
20.0000 mL | Freq: Once | INTRAMUSCULAR | Status: AC
  Administered 2016-12-25: 20 mL
  Filled 2016-12-25: qty 20

## 2016-12-25 MED ORDER — SUGAMMADEX SODIUM 500 MG/5ML IV SOLN
INTRAVENOUS | Status: DC | PRN
  Administered 2016-12-25: 350 mg via INTRAVENOUS

## 2016-12-25 MED ORDER — KETAMINE HCL 10 MG/ML IJ SOLN
INTRAMUSCULAR | Status: DC | PRN
  Administered 2016-12-25 (×2): 25 mg via INTRAVENOUS

## 2016-12-25 MED ORDER — ALBUTEROL SULFATE (2.5 MG/3ML) 0.083% IN NEBU: 3.0000 mL | INHALATION_SOLUTION | RESPIRATORY_TRACT | Status: DC | PRN

## 2016-12-25 MED ORDER — PHENYLEPHRINE HCL 10 MG/ML IJ SOLN
INTRAVENOUS | Status: DC | PRN
  Administered 2016-12-25: 30 ug/min via INTRAVENOUS

## 2016-12-25 MED ORDER — ACETAMINOPHEN 325 MG PO TABS: 650.0000 mg | ORAL_TABLET | ORAL | Status: DC | PRN

## 2016-12-25 MED ORDER — MIDAZOLAM HCL 2 MG/2ML IJ SOLN
INTRAMUSCULAR | Status: AC
  Filled 2016-12-25: qty 2

## 2016-12-25 MED ORDER — PROPOFOL 10 MG/ML IV BOLUS
INTRAVENOUS | Status: DC | PRN
Start: 2016-12-25 — End: 2016-12-25
  Administered 2016-12-25: 120 mg via INTRAVENOUS

## 2016-12-25 MED ORDER — MENTHOL 3 MG MT LOZG: 1.0000 | LOZENGE | OROMUCOSAL | Status: DC | PRN

## 2016-12-25 MED ORDER — MAGIC MOUTHWASH
5.0000 mL | Freq: Three times a day (TID) | ORAL | Status: DC | PRN
  Filled 2016-12-25: qty 5

## 2016-12-25 MED ORDER — BUMETANIDE 2 MG PO TABS
2.0000 mg | ORAL_TABLET | ORAL | Status: DC
  Filled 2016-12-25: qty 1

## 2016-12-25 MED ORDER — VANCOMYCIN HCL 1000 MG IV SOLR
INTRAVENOUS | Status: AC
  Filled 2016-12-25: qty 1000

## 2016-12-25 MED ORDER — FENTANYL CITRATE (PF) 250 MCG/5ML IJ SOLN
INTRAMUSCULAR | Status: AC
Start: 2016-12-25 — End: 2016-12-25
  Filled 2016-12-25: qty 5

## 2016-12-25 MED ORDER — PROMETHAZINE HCL 25 MG/ML IJ SOLN: 6.2500 mg | INTRAMUSCULAR | Status: DC | PRN

## 2016-12-25 MED ORDER — FLUTICASONE PROPIONATE 50 MCG/ACT NA SUSP
1.0000 | Freq: Every day | NASAL | Status: DC | PRN
  Filled 2016-12-25: qty 16

## 2016-12-25 MED ORDER — 0.9 % SODIUM CHLORIDE (POUR BTL) OPTIME
TOPICAL | Status: DC | PRN
  Administered 2016-12-25: 1000 mL

## 2016-12-25 MED ORDER — ROCURONIUM BROMIDE 10 MG/ML (PF) SYRINGE
PREFILLED_SYRINGE | INTRAVENOUS | Status: AC
  Filled 2016-12-25: qty 10

## 2016-12-25 MED ORDER — BACITRACIN ZINC 500 UNIT/GM EX OINT
TOPICAL_OINTMENT | CUTANEOUS | Status: DC | PRN
  Administered 2016-12-25: 1 via TOPICAL

## 2016-12-25 MED ORDER — ONDANSETRON HCL 4 MG/2ML IJ SOLN
INTRAMUSCULAR | Status: AC
  Filled 2016-12-25: qty 2

## 2016-12-25 MED ORDER — LACTATED RINGERS IV SOLN: INTRAVENOUS | Status: DC

## 2016-12-25 MED ORDER — THROMBIN 5000 UNITS EX SOLR
OROMUCOSAL | Status: DC | PRN
  Administered 2016-12-25: 13:00:00 via TOPICAL

## 2016-12-25 MED ORDER — KETAMINE HCL 100 MG/ML IJ SOLN
INTRAMUSCULAR | Status: AC
  Filled 2016-12-25: qty 1

## 2016-12-25 MED ORDER — BUPIVACAINE-EPINEPHRINE (PF) 0.5% -1:200000 IJ SOLN
INTRAMUSCULAR | Status: AC
  Filled 2016-12-25: qty 30

## 2016-12-25 MED ORDER — ACETAMINOPHEN 10 MG/ML IV SOLN
INTRAVENOUS | Status: DC | PRN
  Administered 2016-12-25: 1000 mg via INTRAVENOUS

## 2016-12-25 MED ORDER — SODIUM CHLORIDE 0.9 % IV SOLN
0.2000 mg/kg/h | INTRAVENOUS | Status: DC
  Administered 2016-12-25: .2 mg/kg/h via INTRAVENOUS

## 2016-12-25 MED ORDER — ACETAMINOPHEN 10 MG/ML IV SOLN
INTRAVENOUS | Status: AC
Start: 2016-12-25 — End: 2016-12-25
  Filled 2016-12-25: qty 100

## 2016-12-25 MED ORDER — HYDROMORPHONE HCL 2 MG PO TABS
4.0000 mg | ORAL_TABLET | ORAL | Status: DC | PRN
  Administered 2016-12-26: 4 mg via ORAL
  Administered 2016-12-26 (×2): 2 mg via ORAL
  Filled 2016-12-25 (×3): qty 2

## 2016-12-25 MED ORDER — METOPROLOL SUCCINATE ER 25 MG PO TB24: 50.0000 mg | ORAL_TABLET | Freq: Every evening | ORAL | Status: DC

## 2016-12-25 MED ORDER — EPHEDRINE SULFATE-NACL 50-0.9 MG/10ML-% IV SOSY
PREFILLED_SYRINGE | INTRAVENOUS | Status: DC | PRN
  Administered 2016-12-25: 10 mg via INTRAVENOUS
  Administered 2016-12-25: 5 mg via INTRAVENOUS
  Administered 2016-12-25: 10 mg via INTRAVENOUS
  Administered 2016-12-25: 5 mg via INTRAVENOUS

## 2016-12-25 MED ORDER — SODIUM CHLORIDE 0.9% FLUSH
3.0000 mL | Freq: Two times a day (BID) | INTRAVENOUS | Status: DC
  Administered 2016-12-25 – 2016-12-26 (×2): 3 mL via INTRAVENOUS

## 2016-12-25 MED ORDER — EPHEDRINE 5 MG/ML INJ
INTRAVENOUS | Status: AC
  Filled 2016-12-25: qty 10

## 2016-12-25 MED ORDER — MIDAZOLAM HCL 2 MG/2ML IJ SOLN
INTRAMUSCULAR | Status: DC | PRN
  Administered 2016-12-25: 2 mg via INTRAVENOUS

## 2016-12-25 MED ORDER — DEXAMETHASONE SODIUM PHOSPHATE 10 MG/ML IJ SOLN
INTRAMUSCULAR | Status: AC
  Filled 2016-12-25: qty 1

## 2016-12-25 MED ORDER — LACTATED RINGERS IV SOLN
INTRAVENOUS | Status: DC
  Administered 2016-12-25 (×2): via INTRAVENOUS

## 2016-12-25 MED ORDER — DOCUSATE SODIUM 100 MG PO CAPS
100.0000 mg | ORAL_CAPSULE | Freq: Two times a day (BID) | ORAL | Status: DC
  Administered 2016-12-25 – 2016-12-26 (×2): 100 mg via ORAL
  Filled 2016-12-25 (×2): qty 1

## 2016-12-25 MED ORDER — SODIUM CHLORIDE 0.9 % IJ SOLN
INTRAMUSCULAR | Status: AC
Start: 2016-12-25 — End: 2016-12-25
  Filled 2016-12-25: qty 10

## 2016-12-25 MED ORDER — SODIUM CHLORIDE 0.9% FLUSH
3.0000 mL | INTRAVENOUS | Status: DC | PRN
  Administered 2016-12-26: 3 mL via INTRAVENOUS
  Filled 2016-12-25: qty 3

## 2016-12-25 MED ORDER — MOMETASONE FURO-FORMOTEROL FUM 200-5 MCG/ACT IN AERO
2.0000 | INHALATION_SPRAY | Freq: Two times a day (BID) | RESPIRATORY_TRACT | Status: DC
  Filled 2016-12-25 (×2): qty 8.8

## 2016-12-25 MED ORDER — VECURONIUM BROMIDE 10 MG IV SOLR
INTRAVENOUS | Status: AC
  Filled 2016-12-25: qty 10

## 2016-12-25 MED ORDER — HYDROMORPHONE HCL 1 MG/ML IJ SOLN
0.2500 mg | INTRAMUSCULAR | Status: DC | PRN
  Administered 2016-12-25: 0.5 mg via INTRAVENOUS

## 2016-12-25 MED ORDER — ACETAMINOPHEN 650 MG RE SUPP: 650.0000 mg | RECTAL | Status: DC | PRN

## 2016-12-25 MED ORDER — FENTANYL CITRATE (PF) 100 MCG/2ML IJ SOLN: 25.0000 ug | INTRAMUSCULAR | Status: DC | PRN

## 2016-12-25 MED ORDER — LIDOCAINE 2% (20 MG/ML) 5 ML SYRINGE
INTRAMUSCULAR | Status: DC | PRN
  Administered 2016-12-25: 60 mg via INTRAVENOUS

## 2016-12-25 MED ORDER — ONDANSETRON 4 MG PO TBDP: 4.0000 mg | ORAL_TABLET | Freq: Three times a day (TID) | ORAL | Status: DC | PRN

## 2016-12-25 MED ORDER — THROMBIN 20000 UNITS EX SOLR
CUTANEOUS | Status: AC
  Filled 2016-12-25: qty 20000

## 2016-12-25 MED ORDER — MORPHINE SULFATE (PF) 4 MG/ML IV SOLN: 4.0000 mg | INTRAVENOUS | Status: DC | PRN

## 2016-12-25 MED ORDER — ROCURONIUM BROMIDE 10 MG/ML (PF) SYRINGE
PREFILLED_SYRINGE | INTRAVENOUS | Status: DC | PRN
Start: 2016-12-25 — End: 2016-12-25
  Administered 2016-12-25 (×3): 50 mg via INTRAVENOUS

## 2016-12-25 MED ORDER — PHENOL 1.4 % MT LIQD: 1.0000 | OROMUCOSAL | Status: DC | PRN

## 2016-12-25 MED ORDER — ROPINIROLE HCL 1 MG PO TABS
3.0000 mg | ORAL_TABLET | Freq: Four times a day (QID) | ORAL | Status: DC
  Administered 2016-12-25 – 2016-12-26 (×2): 3 mg via ORAL
  Filled 2016-12-25 (×2): qty 3

## 2016-12-25 MED ORDER — ONDANSETRON HCL 4 MG/2ML IJ SOLN
4.0000 mg | Freq: Four times a day (QID) | INTRAMUSCULAR | Status: DC | PRN
Start: 2016-12-25 — End: 2016-12-26

## 2016-12-25 MED ORDER — ONDANSETRON HCL 4 MG PO TABS: 4.0000 mg | ORAL_TABLET | Freq: Four times a day (QID) | ORAL | Status: DC | PRN

## 2016-12-25 MED ORDER — ONDANSETRON HCL 4 MG/2ML IJ SOLN
INTRAMUSCULAR | Status: DC | PRN
  Administered 2016-12-25 (×2): 4 mg via INTRAVENOUS

## 2016-12-25 MED ORDER — THROMBIN 5000 UNITS EX SOLR
CUTANEOUS | Status: AC
  Filled 2016-12-25: qty 5000

## 2016-12-25 MED ORDER — MUPIROCIN 2 % EX OINT
1.0000 "application " | TOPICAL_OINTMENT | Freq: Two times a day (BID) | CUTANEOUS | Status: DC
  Administered 2016-12-26: 1 via TOPICAL

## 2016-12-25 MED ORDER — VANCOMYCIN HCL 1000 MG IV SOLR
INTRAVENOUS | Status: DC | PRN
  Administered 2016-12-25: 1000 mg via TOPICAL

## 2016-12-25 MED ORDER — BISACODYL 10 MG RE SUPP
10.0000 mg | Freq: Every day | RECTAL | Status: DC | PRN
Start: 2016-12-25 — End: 2016-12-26

## 2016-12-25 MED ORDER — ARTIFICIAL TEARS OPHTHALMIC OINT
TOPICAL_OINTMENT | OPHTHALMIC | Status: DC | PRN
  Administered 2016-12-25: 1 via OPHTHALMIC

## 2016-12-25 MED ORDER — SURGIFOAM 100 EX MISC
CUTANEOUS | Status: DC | PRN
  Administered 2016-12-25: 13:00:00 via TOPICAL

## 2016-12-25 MED ORDER — CEFAZOLIN SODIUM 1 G IJ SOLR
INTRAMUSCULAR | Status: AC
  Filled 2016-12-25: qty 20

## 2016-12-25 MED ORDER — SERTRALINE HCL 50 MG PO TABS
100.0000 mg | ORAL_TABLET | Freq: Every day | ORAL | Status: DC
  Administered 2016-12-25: 100 mg via ORAL
  Filled 2016-12-25: qty 2

## 2016-12-25 MED ORDER — VECURONIUM BROMIDE 10 MG IV SOLR
INTRAVENOUS | Status: DC | PRN
  Administered 2016-12-25: 5 mg via INTRAVENOUS

## 2016-12-25 MED ORDER — FUROSEMIDE 20 MG PO TABS
20.0000 mg | ORAL_TABLET | Freq: Two times a day (BID) | ORAL | Status: DC
  Administered 2016-12-26: 20 mg via ORAL
  Filled 2016-12-25: qty 1

## 2016-12-25 MED ORDER — SODIUM CHLORIDE 0.9 % IV SOLN: 250.0000 mL | INTRAVENOUS | Status: DC

## 2016-12-25 SURGICAL SUPPLY — 65 items
BAG DECANTER FOR FLEXI CONT (MISCELLANEOUS) ×2 IMPLANT
BENZOIN TINCTURE PRP APPL 2/3 (GAUZE/BANDAGES/DRESSINGS) ×2 IMPLANT
BLADE CLIPPER SURG (BLADE) IMPLANT
BUR MATCHSTICK NEURO 3.0 LAGG (BURR) ×4 IMPLANT
BUR PRECISION FLUTE 6.0 (BURR) ×2 IMPLANT
CANISTER SUCT 3000ML PPV (MISCELLANEOUS) ×2 IMPLANT
CARTRIDGE OIL MAESTRO DRILL (MISCELLANEOUS) ×1 IMPLANT
CONT SPEC 4OZ CLIKSEAL STRL BL (MISCELLANEOUS) ×6 IMPLANT
CORDS BIPOLAR (ELECTRODE) ×2 IMPLANT
COVER BACK TABLE 60X90IN (DRAPES) ×4 IMPLANT
DIFFUSER DRILL AIR PNEUMATIC (MISCELLANEOUS) ×2 IMPLANT
DRAPE C-ARM 42X72 X-RAY (DRAPES) ×4 IMPLANT
DRAPE HALF SHEET 40X57 (DRAPES) ×2 IMPLANT
DRAPE LAPAROTOMY 100X72X124 (DRAPES) ×2 IMPLANT
DRAPE POUCH INSTRU U-SHP 10X18 (DRAPES) ×2 IMPLANT
DRAPE SURG 17X23 STRL (DRAPES) ×8 IMPLANT
ELECT BLADE 4.0 EZ CLEAN MEGAD (MISCELLANEOUS) ×2
ELECT REM PT RETURN 9FT ADLT (ELECTROSURGICAL) ×2
ELECTRODE BLDE 4.0 EZ CLN MEGD (MISCELLANEOUS) ×1 IMPLANT
ELECTRODE REM PT RTRN 9FT ADLT (ELECTROSURGICAL) ×1 IMPLANT
EVACUATOR 1/8 PVC DRAIN (DRAIN) IMPLANT
GAUZE SPONGE 4X4 12PLY STRL (GAUZE/BANDAGES/DRESSINGS) ×2 IMPLANT
GAUZE SPONGE 4X4 16PLY XRAY LF (GAUZE/BANDAGES/DRESSINGS) IMPLANT
GLOVE BIO SURGEON STRL SZ8 (GLOVE) ×4 IMPLANT
GLOVE BIO SURGEON STRL SZ8.5 (GLOVE) ×4 IMPLANT
GLOVE EXAM NITRILE LRG STRL (GLOVE) IMPLANT
GLOVE EXAM NITRILE XL STR (GLOVE) IMPLANT
GLOVE EXAM NITRILE XS STR PU (GLOVE) IMPLANT
GOWN STRL REUS W/ TWL LRG LVL3 (GOWN DISPOSABLE) IMPLANT
GOWN STRL REUS W/ TWL XL LVL3 (GOWN DISPOSABLE) ×2 IMPLANT
GOWN STRL REUS W/TWL 2XL LVL3 (GOWN DISPOSABLE) IMPLANT
GOWN STRL REUS W/TWL LRG LVL3 (GOWN DISPOSABLE)
GOWN STRL REUS W/TWL XL LVL3 (GOWN DISPOSABLE) ×2
HEMOSTAT POWDER KIT SURGIFOAM (HEMOSTASIS) ×2 IMPLANT
KIT BASIN OR (CUSTOM PROCEDURE TRAY) ×2 IMPLANT
KIT ROOM TURNOVER OR (KITS) ×2 IMPLANT
NEEDLE HYPO 21X1.5 SAFETY (NEEDLE) IMPLANT
NEEDLE HYPO 22GX1.5 SAFETY (NEEDLE) ×2 IMPLANT
NS IRRIG 1000ML POUR BTL (IV SOLUTION) ×2 IMPLANT
OIL CARTRIDGE MAESTRO DRILL (MISCELLANEOUS) ×2
PACK LAMINECTOMY NEURO (CUSTOM PROCEDURE TRAY) ×2 IMPLANT
PAD ARMBOARD 7.5X6 YLW CONV (MISCELLANEOUS) ×6 IMPLANT
PATTIES SURGICAL .5 X.5 (GAUZE/BANDAGES/DRESSINGS) ×2 IMPLANT
PATTIES SURGICAL .5 X1 (DISPOSABLE) IMPLANT
PATTIES SURGICAL 1X1 (DISPOSABLE) ×2 IMPLANT
ROD TI ALLOY CURVED VIT 5.5X45 (Rod) ×4 IMPLANT
SCREW PA VITALITY 7.5X50MM (Screw) ×4 IMPLANT
SCREW VITALITY PA 6.5X50MM (Screw) ×2 IMPLANT
SCREW VITALITY PA 7.5X45MM (Screw) ×4 IMPLANT
SPACER ALTERA 10X31 8-12MM-8 (Spacer) ×2 IMPLANT
SPONGE LAP 4X18 X RAY DECT (DISPOSABLE) IMPLANT
SPONGE NEURO XRAY DETECT 1X3 (DISPOSABLE) IMPLANT
SPONGE SURGIFOAM ABS GEL 100 (HEMOSTASIS) ×2 IMPLANT
STRIP BIOACTIVE 20CC 25X100X8 (Miscellaneous) ×2 IMPLANT
STRIP CLOSURE SKIN 1/2X4 (GAUZE/BANDAGES/DRESSINGS) ×2 IMPLANT
SUT VIC AB 1 CT1 18XBRD ANBCTR (SUTURE) ×2 IMPLANT
SUT VIC AB 1 CT1 8-18 (SUTURE) ×2
SUT VIC AB 2-0 CP2 18 (SUTURE) ×4 IMPLANT
SYR 30ML LL (SYRINGE) ×2 IMPLANT
TAPE CLOTH SURG 4X10 WHT LF (GAUZE/BANDAGES/DRESSINGS) ×2 IMPLANT
TOP CLOSURE TORQ LIMIT (Neuro Prosthesis/Implant) ×8 IMPLANT
TOWEL GREEN STERILE (TOWEL DISPOSABLE) ×2 IMPLANT
TOWEL GREEN STERILE FF (TOWEL DISPOSABLE) ×2 IMPLANT
TRAY FOLEY W/METER SILVER 16FR (SET/KITS/TRAYS/PACK) ×2 IMPLANT
WATER STERILE IRR 1000ML POUR (IV SOLUTION) ×2 IMPLANT

## 2016-12-25 NOTE — H&P (Signed)
Subjective: The patient is a 71 year old white female who's had a prior L5-S1 instrumentation and fusion by another physician years ago. She has developed recurrent back, buttock and leg pain consistent with neurogenic claudication. She has failed medical management and was worked up with lumbar x-rays and a lumbar MRI. This demonstrated the patient had a spondylolisthesis and severe stenosis at L4-5. I discussed the various treatment options with the patient including surgery. She has weighed the risks, benefits, and alternatives to surgery and decided proceed with an exploration of her lumbar fusion with an L4-5 decompression, instrumentation, and fusion.   Past Medical History:  Diagnosis Date  . Anemia    takes ferrous sulfate daily  . Anxiety   . Arthritis   . Bladder spasm   . Chronic back pain    spinal stenosis  . Colon cancer ([place])   . Complication of anesthesia    B/P bottoms out, hard to wake up   . Contact dermatitis   . COPD (chronic obstructive pulmonary disease) (HCC)    Albuterol inhaler and DuoNeb as needed  . Coronary artery disease    mild by 08/25/09 cath  . Headache   . Heart murmur    never had any problems  . History of blood transfusion    no abnormal reaction noted  . History of bronchitis   . History of MRSA infection 6+ yrs ago  . Hypertension    takes Metoprolol daily  . Insomnia    takes Trazodone nightly  . Joint pain   . Joint swelling   . Nocturia   . Panic attacks    takes Effexor daily  . Peripheral edema    takes Lasix daily and takes Bumex every 3 days.Takes Aldactone daily   . Restless leg syndrome    takes Requip daily  . RLS (restless legs syndrome)   . Sleep apnea    doesnt use CPAP    Past Surgical History:  Procedure Laterality Date  . ABDOMINAL HYSTERECTOMY    . CHOLECYSTECTOMY    . COLON SURGERY     partial colectomy d/t cancer cells  . COLONOSCOPY    . I&D of left hip      x 4  . JOINT REPLACEMENT    . Resection total  hip    . Revision of total hip arthroplasty     x3-4  . right hip replacement    . TOTAL HIP ARTHROPLASTY Left     Allergies  Allergen Reactions  . Hydrocodone-Chlorpheniramine Shortness Of Breath  . Prednisone Shortness Of Breath    Patient states she can take this now  . Chlorhexidine Rash  . Methadone Other (See Comments)    Reaction: Altered Mental Status  . Sulfa Antibiotics Other (See Comments)    Reaction:  Unknown   . Benadryl [Diphenhydramine] Anxiety  . Codeine Anxiety  . Diphenhydramine-Zinc Acetate Anxiety    Social History  Substance Use Topics  . Smoking status: Current Every Day Smoker    Packs/day: 0.75    Years: 51.00    Types: Cigarettes    Start date: 07/08/1964  . Smokeless tobacco: Never Used  . Alcohol use No    Family History  Problem Relation Age of Onset  . Cancer Mother   . Stroke Father   . Diabetes Sister   . Mental illness Sister    Prior to Admission medications   Medication Sig Start Date End Date Taking? Authorizing Provider  bumetanide (BUMEX) 2 MG tablet Take  2 mg by mouth every 3 (three) days. 05/09/16  Yes [provider]  Calcium Carbonate-Vit D-Min (CALTRATE 600+D PLUS PO) Take 1 tablet by mouth 2 (two) times daily.   Yes [provider]  Cyanocobalamin (VITAMIN B-12) 2500 MCG SUBL Place 2,500 mcg under the tongue at bedtime.   Yes [provider]  Emollient (CERAVE) CREA Apply 1 application topically 2 (two) times daily. Applied neck down for dermatitis   Yes [provider]  fluticasone (FLONASE) 50 MCG/ACT nasal spray Place 1 spray into both nostrils daily as needed (for sinus issues).    Yes [provider]  furosemide (LASIX) 20 MG tablet Take 1 tablet (20 mg total) by mouth daily. Patient taking differently: Take 20 mg by mouth 2 (two) times daily.  04/29/16 04/29/17 Yes Lavonia Drafts, MD  Iron-Vitamin C (VITRON-C PO) Take 1 tablet by mouth at bedtime.   Yes [provider]   metoprolol succinate (TOPROL-XL) 50 MG 24 hr tablet Take 1 tablet (50 mg total) by mouth every evening. Take with or immediately following a meal. Patient taking differently: Take 50 mg by mouth at bedtime. Take with or immediately following a meal. 12/26/15  Yes Crissman, Jeannette How, MD  mometasone (ELOCON) 0.1 % cream Apply 1 application topically 2 (two) times daily. For dermatitis 04/24/16  Yes [provider]  Multiple Vitamins-Minerals (MULTIVITAMIN GUMMIES WOMENS PO) Take 2 tablets by mouth at bedtime.   Yes [provider]  mupirocin ointment (BACTROBAN) 2 % Apply 1 application topically 2 (two) times daily as needed. For skin infection. 10/03/16  Yes [provider]  naproxen (NAPROSYN) 500 MG tablet Take 500 mg by mouth at bedtime.    Yes [provider]  sertraline (ZOLOFT) 100 MG tablet Take 100 mg by mouth at bedtime.    Yes [provider]  spironolactone (ALDACTONE) 25 MG tablet Take 25 mg by mouth at bedtime.  05/24/16  Yes [provider]  acetaminophen (TYLENOL) 500 MG tablet Take 1,000 mg by mouth every 6 (six) hours as needed (for pain/back pain.).    [provider]  albuterol (PROVENTIL HFA;VENTOLIN HFA) 108 (90 Base) MCG/ACT inhaler Inhale 2 puffs into the lungs every 4 (four) hours as needed for wheezing or shortness of breath. 12/05/15   Loletha Grayer, MD  budesonide-formoterol [facility]) 160-4.5 MCG/ACT inhaler Inhale 2 puffs into the lungs 2 (two) times daily. Patient taking differently: Inhale 2 puffs into the lungs 2 (two) times daily as needed (for respiratory issues).  12/05/15   Loletha Grayer, MD  ipratropium-albuterol (DUONEB) 0.5-2.5 (3) MG/3ML SOLN Take 3 mLs by nebulization every 6 (six) hours as needed (for wheezing/shortness of breath).    [provider]  magic mouthwash SOLN Take 5 mLs by mouth 3 (three) times daily as needed for mouth pain. 01/10/16   Volney American, PA-C  ondansetron  (ZOFRAN ODT) 4 MG disintegrating tablet Take 1 tablet (4 mg total) by mouth every 8 (eight) hours as needed for nausea or vomiting. 01/10/16   Volney American, PA-C  rOPINIRole (REQUIP) 3 MG tablet Take 1 tablet (3 mg total) by mouth 4 (four) times daily. 04/20/15   [name], MD  traMADol (ULTRAM) 50 MG tablet Take 1 tablet (50 mg total) by mouth every 6 (six) hours as needed. Patient not taking: Reported on 12/11/2016 08/13/16 08/13/17  Earleen Newport, MD     Review of Systems  Positive ROS: As above  All other systems have  been reviewed and were otherwise negative with the exception of those mentioned in the HPI and as above.  Objective: Vital signs in last 24 hours: Temp:  [98.1 F (36.7 C)] 98.1 F (36.7 C) (06/20 1016) Pulse Rate:  [64] 64 (06/20 1016) Resp:  [18] 18 (06/20 1016) BP: (114)/(51) 114/51 (06/20 1016) SpO2:  [95 %] 95 % (06/20 1016) Weight:  [83.7 kg (184 lb 8 oz)] 83.7 kg (184 lb 8 oz) (06/20 1016)  General Appearance: Alert Head: Normocephalic, without obvious abnormality, atraumatic Eyes: PERRL, conjunctiva/corneas clear, EOM's intact,    Ears: Normal  Throat: Normal  Neck: Supple, Back: unremarkable, the patient's lumbar incision is well-healed. Lungs: Clear to auscultation bilaterally, respirations unlabored Heart: Regular rate and rhythm, no murmur, rub or gallop Abdomen: Soft, non-tender Extremities: Extremities normal, atraumatic, no cyanosis or edema Skin: unremarkable  NEUROLOGIC:   Mental status: alert and oriented,Motor Exam - grossly normal Sensory Exam - grossly normal Reflexes:  Coordination - grossly normal Gait - grossly normal Balance - grossly normal Cranial Nerves: I: smell Not tested  II: visual acuity  OS: Normal  OD: Normal   II: visual fields Full to confrontation  II: pupils Equal, round, reactive to light  III,VII: ptosis None  III,IV,VI: extraocular muscles  Full ROM  V: mastication Normal  V: facial light  touch sensation  Normal  V,VII: corneal reflex  Present  VII: facial muscle function - upper  Normal  VII: facial muscle function - lower Normal  VIII: hearing Not tested  IX: soft palate elevation  Normal  IX,X: gag reflex Present  XI: trapezius strength  5/5  XI: sternocleidomastoid strength 5/5  XI: neck flexion strength  5/5  XII: tongue strength  Normal    Data Review Lab Results  Component Value Date   WBC 7.2 12/16/2016   HGB 13.6 12/16/2016   HCT 41.1 12/16/2016   MCV 93.0 12/16/2016   PLT 225 12/16/2016   Lab Results  Component Value Date   NA 139 12/16/2016   K 3.9 12/16/2016   CL 106 12/16/2016   CO2 24 12/16/2016   BUN 18 12/16/2016   CREATININE 0.79 12/16/2016   GLUCOSE 114 (H) 12/16/2016   Lab Results  Component Value Date   INR 2.2 06/21/2014    Assessment/Plan: L4-5 spinal stenosis, spondylolisthesis degenerative disease, lumbago, lumbar radiculopathy, neurogenic claudication: I have discussed the situation with the patient. I have reviewed her imaging studies with her and pointed out the abnormalities. We have discussed the various treatment options including surgery. I have described the surgical treatment option of a aspiration of her lumbar fusion with a L4-5 decompression, instrumentation, and fusion. I have shown her surgical models. We have discussed the risks, benefits, alternatives, expected postoperative course, and likelihood of achieving her goals for surgery. I have answered all the patient's questions. She has decided to proceed with surgery.   Makalia Bare D 12/25/2016 11:57 AM

## 2016-12-25 NOTE — Anesthesia Procedure Notes (Signed)
Procedure Name: Intubation Date/Time: 12/25/2016 12:14 PM Performed by: Mervyn Gay Pre-anesthesia Checklist: Patient identified, Patient being monitored, Timeout performed, Emergency Drugs available and Suction available Patient Re-evaluated:Patient Re-evaluated prior to inductionOxygen Delivery Method: Circle System Utilized Preoxygenation: Pre-oxygenation with 100% oxygen Intubation Type: IV induction Ventilation: Mask ventilation without difficulty Laryngoscope Size: Miller and 3 Grade View: Grade I Tube type: Oral Tube size: 7.5 mm Number of attempts: 1 Airway Equipment and Method: Stylet Placement Confirmation: ETT inserted through vocal cords under direct vision,  positive ETCO2 and breath sounds checked- equal and bilateral Secured at: 21 cm Tube secured with: Tape Dental Injury: Teeth and Oropharynx as per pre-operative assessment

## 2016-12-25 NOTE — Transfer of Care (Signed)
Immediate Anesthesia Transfer of Care Note  Patient: Ann Silva  Procedure(s) Performed: Procedure(s) with comments: POSTERIOR LUMBAR INTERBODY FUSION, INTERBODY PROSTHESIS,POSTERIOR LATERAL ARTHRODESIS,POSTERIOR SEGMENTAL INSTRUMENTATION LUMBAR FOUR- LUMBAR FIVE (N/A) - POSTERIOR LUMBAR INTERBODY FUSION, INTERBODY PROSTHESIS,POSTERIOR LATERAL ARTHRODESIS,POSTERIOR SEGMENTAL INSTRUMENTATION LUMBAR 4- LUMBAR 5  Patient Location: PACU  Anesthesia Type:General  Level of Consciousness: drowsy and patient cooperative  Airway & Oxygen Therapy: Patient Spontanous Breathing and Patient connected to face mask oxygen  Post-op Assessment: Report given to RN and Post -op Vital signs reviewed and stable  Post vital signs: Reviewed and stable  Last Vitals:  Vitals:   12/25/16 1016  BP: (!) 114/51  Pulse: 64  Resp: 18  Temp: 36.7 C    Last Pain:  Vitals:   12/25/16 1028  TempSrc:   PainSc: 10-Worst pain ever      Patients Stated Pain Goal: 4 (07/01/82 4621)  Complications: No apparent anesthesia complications

## 2016-12-25 NOTE — Anesthesia Preprocedure Evaluation (Addendum)
Anesthesia Evaluation  Patient identified by MRN, date of birth, ID band Patient awake    Reviewed: Allergy & Precautions, NPO status , Patient's Chart, lab work & pertinent test results, reviewed documented beta blocker date and time   History of Anesthesia Complications (+) PROLONGED EMERGENCE  Airway Mallampati: III   Neck ROM: Full  Mouth opening: Limited Mouth Opening  Dental  (+) Edentulous Upper, Edentulous Lower, Lower Dentures, Upper Dentures, Dental Advisory Given   Pulmonary sleep apnea , COPD, Current Smoker,    breath sounds clear to auscultation + decreased breath sounds      Cardiovascular Exercise Tolerance: Poor hypertension, Pt. on medications and Pt. on home beta blockers + CAD  + Valvular Problems/Murmurs  Rhythm:Regular Rate:Normal     Neuro/Psych  Headaches, PSYCHIATRIC DISORDERS Anxiety Depression    GI/Hepatic negative GI ROS, Neg liver ROS,   Endo/Other  Morbid obesity  Renal/GU negative Renal ROS     Musculoskeletal  (+) Arthritis ,   Abdominal (+) + obese,   Bowel sounds: normal.  Peds  Hematology   Anesthesia Other Findings   Reproductive/Obstetrics                            BP Readings from Last 3 Encounters:  12/16/16 120/68  08/13/16 118/67  07/16/16 122/63   Lab Results  Component Value Date   WBC 7.2 12/16/2016   HGB 13.6 12/16/2016   HCT 41.1 12/16/2016   MCV 93.0 12/16/2016   PLT 225 12/16/2016     Chemistry      Component Value Date/Time   NA 139 12/16/2016 1449   NA 138 01/10/2016 1537   NA 139 07/09/2014 1218   K 3.9 12/16/2016 1449   K 3.4 (L) 07/09/2014 1218   CL 106 12/16/2016 1449   CL 104 07/09/2014 1218   CO2 24 12/16/2016 1449   CO2 27 07/09/2014 1218   BUN 18 12/16/2016 1449   BUN 16 01/10/2016 1537   BUN 23 (H) 07/09/2014 1218   CREATININE 0.79 12/16/2016 1449   CREATININE 0.52 10/14/2014 1114      Component Value  Date/Time   CALCIUM 10.6 (H) 12/16/2016 1449   CALCIUM 10.5 (H) 10/14/2014 1114   ALKPHOS 71 08/21/2015 1924   ALKPHOS 101 10/14/2014 1114   AST 24 08/21/2015 1924   AST 23 10/14/2014 1114   ALT 16 08/21/2015 1924   ALT 18 10/14/2014 1114   BILITOT 1.0 08/21/2015 1924   BILITOT 0.5 10/14/2014 1114       CXR 08/13/16: Chronic lung changes without acute pulmonary abnormality noted. Aortic atherosclerosis.  EKG 08/13/16: NSR. Incomplete right bundle branch block   Echo 05/14/16 (Care everywhere):  - Normal LV systolic function - Normal RV systolic function - Mild TR.  - Trivial AR, MR, PR - No valvular stenosis  Cardiac cath 08/25/09:  - mid LAD 10% stenosis - mid CX 20% stenosis  Anesthesia Physical Anesthesia Plan  ASA: III  Anesthesia Plan: General   Post-op Pain Management:    Induction: Intravenous  PONV Risk Score and Plan: 3 and Ondansetron, Dexamethasone, Propofol and Midazolam  Airway Management Planned: Oral ETT  Additional Equipment:   Intra-op Plan:   Post-operative Plan: Extubation in OR  Informed Consent: I have reviewed the patients History and Physical, chart, labs and discussed the procedure including the risks, benefits and alternatives for the proposed anesthesia with the patient or authorized representative who has indicated his/her  understanding and acceptance.   Dental advisory given  Plan Discussed with: CRNA  Anesthesia Plan Comments:         Anesthesia Quick Evaluation

## 2016-12-25 NOTE — Progress Notes (Signed)
Orthopedic Tech Progress Note Patient Details:  Ann Silva Nov 05, 1945 [id number] Brace completed by bio-tech. Patient ID: Ann Silva, female   DOB: 07-25-1945, 71 y.o.   MRN: [id number]   Braulio Bosch 12/25/2016, 5:22 PM

## 2016-12-25 NOTE — Op Note (Signed)
Brief history: The patient is a 71 year old white female who had a previous L5-S1 fusion by the another physician years ago. She has developed recurrent back, buttock and leg pain consistent with neurogenic claudication. She has failed medical management. She was worked up with a lumbar MRI and lumbar x-rays which demonstrated an L4-5 spinal stasis with spinal stenosis. I discussed the various treatment options with the patient and her family. She has decided to proceed with surgery after weighing the risks, benefits, and alternatives to surgery.  Preoperative diagnosis: L4-5 spondylolisthesis, Degenerative disc disease, spinal stenosis compressing both the L4 and the L5 nerve roots; lumbago; lumbar radiculopathy; neurogenic claudication  Postoperative diagnosis: The same  Procedure: Bilateral L4-5 Laminotomy/foraminotomies to decompress the bilateral L4 and L5 nerve roots(the work required to do this was in addition to the work required to do the posterior lumbar interbody fusion because of the patient's spinal stenosis, facet arthropathy. Etc. requiring a wide decompression of the nerve roots.); L4-5 transforaminal lumbar interbody fusion with local morselized autograft bone and Kinnex graft extender; insertion of interbody prosthesis at L4-5 (globus peek expandable interbody prosthesis); posterior nonsegmental instrumentation from L4 to L5 with globus titanium pedicle screws and rods; posterior lateral arthrodesis at L4-5 bilaterally with local morselized autograft bone and Kinnex bone graft extender; exploration of lumbar fusion, removal of hardware at L5-S1  Surgeon: Dr. Earle Gell  Asst.: Dr. Sherwood Gambler  Anesthesia: Gen. endotracheal  Estimated blood loss: 250 mL  Drains: None  Complications: None  Description of procedure: The patient was brought to the operating room by the anesthesia team. General endotracheal anesthesia was induced. The patient was turned to the prone position on the  Wilson frame. The patient's lumbosacral region was then prepared with Betadine scrub and Betadine solution. Sterile drapes were applied.  I then injected the area to be incised with Marcaine with epinephrine solution. I then used the scalpel to make a linear midline incision over the L4-5 and L5-S1 interspace. I then used electrocautery to perform a bilateral subperiosteal dissection exposing the spinous process and lamina of L4, L5 and exposing all the hardware at L5-S1.  We then inserted the Verstrac retractor to provide exposure.  I explored the fusion by removing the old hardware at L5-S1. There arthrodesis at L5-S1. Solid.  I began the decompression by using the high speed drill to perform laminotomies at L4-5 bilaterally. We then used the Kerrison punches to widen the laminotomy and removed the ligamentum flavum at L4-5 bilaterally. We used the Kerrison punches to remove the medial facets at L4-5 bilaterally. We performed wide foraminotomies about the bilateral L4 and L5 nerve roots completing the decompression.  We now turned our attention to the posterior lumbar interbody fusion. I used a scalpel to incise the intervertebral disc at L4-5 bilaterally. I then performed a partial intervertebral discectomy at L4-5 bilaterally using the pituitary forceps. We prepared the vertebral endplates at F3-8 bilaterally for the fusion by removing the soft tissues with the curettes. We then used the trial spacers to pick the appropriate sized interbody prosthesis. We prefilled his prosthesis with a combination of local morselized autograft bone that we obtained during the decompression as well as Kinnex bone graft extender. We inserted the prefilled prosthesis into the interspace at L4-5, I then expanded the prosthesis. There was a good snug fit of the prosthesis in the interspace. We then filled and the remainder of the intervertebral disc space with local morselized autograft bone and Kinnex. This completed the  posterior lumbar interbody  arthrodesis.  We now turned attention to the instrumentation. Under fluoroscopic guidance we cannulated the bilateral L4 and L5 pedicles with the bone probe. We then removed the bone probe. We then tapped the pedicle with a 6.5 millimeter tap. We then removed the tap. We probed inside the tapped pedicle with a ball probe to rule out cortical breaches. We then inserted a 7.5 x 45 and 50 millimeter pedicle screw into the L4 and L5 pedicles bilaterally under fluoroscopic guidance. We then palpated along the medial aspect of the pedicles to rule out cortical breaches. There were none. The nerve roots were not injured. We then connected the unilateral pedicle screws with a lordotic rod. We compressed the construct and secured the rod in place with the caps. We then tightened the caps appropriately. This completed the instrumentation from L4-5.  We now turned our attention to the posterior lateral arthrodesis at L4-5 bilaterally. We used the high-speed drill to decorticate the remainder of the facets, pars, transverse process at L4-5 bilaterally. We then applied a combination of local morselized autograft bone and Kinnex bone graft extender over these decorticated posterior lateral structures. This completed the posterior lateral arthrodesis.  We then obtained hemostasis using bipolar electrocautery. We irrigated the wound out with bacitracin solution. We inspected the thecal sac and nerve roots and noted they were well decompressed. We then removed the retractor. We placed vancomycin powder in the wound. We reapproximated patient's thoracolumbar fascia with interrupted #1 Vicryl suture. We reapproximated patient's subcutaneous tissue with interrupted 2-0 Vicryl suture. The reapproximated patient's skin with Steri-Strips and benzoin. The wound was then coated with bacitracin ointment. A sterile dressing was applied. The drapes were removed. The patient was subsequently returned to the  supine position where they were extubated by the anesthesia team. He was then transported to the post anesthesia care unit in stable condition. All sponge instrument and needle counts were reportedly correct at the end of this case.

## 2016-12-25 NOTE — Anesthesia Postprocedure Evaluation (Signed)
Anesthesia Post Note  Patient: LOUANNA VANLIEW  Procedure(s) Performed: Procedure(s) (LRB): POSTERIOR LUMBAR INTERBODY FUSION, INTERBODY PROSTHESIS,POSTERIOR LATERAL ARTHRODESIS,POSTERIOR SEGMENTAL INSTRUMENTATION LUMBAR FOUR- LUMBAR FIVE (N/A)     Patient location during evaluation: PACU Anesthesia Type: General Level of consciousness: awake Pain management: pain level controlled Vital Signs Assessment: post-procedure vital signs reviewed and stable Respiratory status: spontaneous breathing Cardiovascular status: stable Anesthetic complications: no    Last Vitals:  Vitals:   12/25/16 1650 12/25/16 1705  BP: 105/62 99/65  Pulse: 70 70  Resp: 18 17  Temp:      Last Pain:  Vitals:   12/25/16 1705  TempSrc:   PainSc: 0-No pain    LLE Motor Response: Purposeful movement;Responds to commands (12/25/16 1705) LLE Sensation: No numbness;No pain;No tingling (12/25/16 1705) RLE Motor Response: Purposeful movement;Responds to commands (12/25/16 1705) RLE Sensation: No numbness;No pain;No tingling (12/25/16 1705)      Taeko Schaffer

## 2016-12-25 NOTE — Anesthesia Procedure Notes (Signed)
Procedures

## 2016-12-26 LAB — CBC
HCT: 32.9 % — ABNORMAL LOW (ref 36.0–46.0)
HEMOGLOBIN: 10.6 g/dL — AB (ref 12.0–15.0)
MCH: 30.6 pg (ref 26.0–34.0)
MCHC: 32.2 g/dL (ref 30.0–36.0)
MCV: 95.1 fL (ref 78.0–100.0)
Platelets: 163 10*3/uL (ref 150–400)
RBC: 3.46 MIL/uL — AB (ref 3.87–5.11)
RDW: 14.6 % (ref 11.5–15.5)
WBC: 11.5 10*3/uL — AB (ref 4.0–10.5)

## 2016-12-26 LAB — BASIC METABOLIC PANEL
ANION GAP: 7 (ref 5–15)
BUN: 19 mg/dL (ref 6–20)
CALCIUM: 9.3 mg/dL (ref 8.9–10.3)
CO2: 25 mmol/L (ref 22–32)
Chloride: 106 mmol/L (ref 101–111)
Creatinine, Ser: 0.87 mg/dL (ref 0.44–1.00)
GFR calc Af Amer: >60 mL/min (ref >60)
Glucose, Bld: 134 mg/dL — ABNORMAL HIGH (ref 65–99)
POTASSIUM: 3.9 mmol/L (ref 3.5–5.1)
SODIUM: 138 mmol/L (ref 135–145)

## 2016-12-26 MED ORDER — HYDROMORPHONE HCL 4 MG PO TABS: 4.0000 mg | ORAL_TABLET | Freq: Four times a day (QID) | ORAL | 0 refills | Status: DC | PRN

## 2016-12-26 MED ORDER — DOCUSATE SODIUM 100 MG PO CAPS: 100.0000 mg | ORAL_CAPSULE | Freq: Two times a day (BID) | ORAL | 0 refills | Status: DC

## 2016-12-26 MED ORDER — CYCLOBENZAPRINE HCL 10 MG PO TABS: 10.0000 mg | ORAL_TABLET | Freq: Three times a day (TID) | ORAL | 1 refills | Status: DC | PRN

## 2016-12-26 NOTE — Discharge Summary (Signed)
Physician Discharge Summary  Patient ID: LIELA RYLEE MRN: [id number] DOB/AGE: [birth year] 71 y.o.  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 The surgery went well.  The patient's postoperative course was unremarkable. On postoperative day #1 the patient requested discharge to home. She was given oral and written discharge instructions. All her questions were answered.  Consults: Physical therapy Significant Diagnostic Studies: None Treatments: Exploration of lumbar fusion, removal of lumbar hardware, L4-5 decompression, instrumentation, and fusion. Discharge Exam: Blood pressure 103/67, pulse 75, temperature 99.1 F (37.3 C), temperature source Oral, resp. rate 18, height 5\' 1"  (1.549 m), weight 83.7 kg (184 lb 8 oz), SpO2 93 %. The patient is alert and pleasant. She looks well. Her strength is grossly normal in her lower extremities.  Disposition: Home  Discharge Instructions    Call MD for:  difficulty breathing, headache or visual disturbances    Complete by:  As directed    Call MD for:  extreme fatigue    Complete by:  As directed    Call MD for:  hives    Complete by:  As directed    Call MD for:  persistant dizziness or light-headedness    Complete by:  As directed    Call MD for:  persistant nausea and vomiting    Complete by:  As directed    Call MD for:  redness, tenderness, or signs of infection (pain, swelling, redness, odor or green/yellow discharge around incision site)    Complete by:  As directed    Call MD for:   severe uncontrolled pain    Complete by:  As directed    Call MD for:  temperature >100.4    Complete by:  As directed    Diet - low sodium heart healthy    Complete by:  As directed    Discharge instructions    Complete by:  As directed    Call [phone] for a followup appointment. Take a stool softener while you are using pain medications.   Driving Restrictions    Complete by:  As directed    Do not drive for 2 weeks.   Increase activity slowly    Complete by:  As directed    Lifting restrictions    Complete by:  As directed    Do not lift more than 5 pounds. No excessive bending or twisting.   May shower / Bathe    Complete by:  As directed    He may shower after the pain she is removed 3 days after surgery. Leave the incision alone.   Remove dressing in 48 hours    Complete by:  As directed    Your stitches are under the scan and will dissolve by themselves. The Steri-Strips will fall off after you take a few showers. Do not rub back or pick at the wound, Leave the wound alone.     Allergies as of 12/26/2016      Reactions   Hydrocodone-chlorpheniramine Shortness Of Breath   Prednisone Shortness Of Breath   Patient states she can take this now   Chlorhexidine Rash   Methadone Other (See Comments)   Reaction: Altered Mental Status  Sulfa Antibiotics Other (See Comments)   Reaction:  Unknown    Benadryl [diphenhydramine] Anxiety   Codeine Anxiety   Diphenhydramine-zinc Acetate Anxiety      Medication List    STOP taking these medications   naproxen 500 MG tablet Commonly known as:  NAPROSYN   traMADol 50 MG tablet Commonly known as:  ULTRAM     TAKE these medications   acetaminophen 500 MG tablet Commonly known as:  TYLENOL Take 1,000 mg by mouth every 6 (six) hours as needed (for pain/back pain.).   albuterol 108 (90 Base) MCG/ACT inhaler Commonly known as:  PROVENTIL HFA;VENTOLIN HFA Inhale 2 puffs into the lungs every 4 (four) hours as needed for  wheezing or shortness of breath.   budesonide-formoterol 160-4.5 MCG/ACT inhaler Commonly known as:  SYMBICORT Inhale 2 puffs into the lungs 2 (two) times daily. What changed:  when to take this  reasons to take this   bumetanide 2 MG tablet Commonly known as:  BUMEX Take 2 mg by mouth every 3 (three) days.   CALTRATE 600+D PLUS PO Take 1 tablet by mouth 2 (two) times daily.   CERAVE Crea Apply 1 application topically 2 (two) times daily. Applied neck down for dermatitis   cyclobenzaprine 10 MG tablet Commonly known as:  FLEXERIL Take 1 tablet (10 mg total) by mouth 3 (three) times daily as needed for muscle spasms.   docusate sodium 100 MG capsule Commonly known as:  COLACE Take 1 capsule (100 mg total) by mouth 2 (two) times daily.   fluticasone 50 MCG/ACT nasal spray Commonly known as:  FLONASE Place 1 spray into both nostrils daily as needed (for sinus issues).   furosemide 20 MG tablet Commonly known as:  LASIX Take 1 tablet (20 mg total) by mouth daily. What changed:  when to take this   HYDROmorphone 4 MG tablet Commonly known as:  DILAUDID Take 1 tablet (4 mg total) by mouth every 6 (six) hours as needed for severe pain.   ipratropium-albuterol 0.5-2.5 (3) MG/3ML Soln Commonly known as:  DUONEB Take 3 mLs by nebulization every 6 (six) hours as needed (for wheezing/shortness of breath).   magic mouthwash Soln Take 5 mLs by mouth 3 (three) times daily as needed for mouth pain.   metoprolol succinate 50 MG 24 hr tablet Commonly known as:  TOPROL-XL Take 1 tablet (50 mg total) by mouth every evening. Take with or immediately following a meal. What changed:  when to take this  additional instructions   mometasone 0.1 % cream Commonly known as:  ELOCON Apply 1 application topically 2 (two) times daily. For dermatitis   MULTIVITAMIN GUMMIES WOMENS PO Take 2 tablets by mouth at bedtime.   mupirocin ointment 2 % Commonly known as:  BACTROBAN Apply 1  application topically 2 (two) times daily as needed. For skin infection.   ondansetron 4 MG disintegrating tablet Commonly known as:  ZOFRAN ODT Take 1 tablet (4 mg total) by mouth every 8 (eight) hours as needed for nausea or vomiting.   rOPINIRole 3 MG tablet Commonly known as:  REQUIP Take 1 tablet (3 mg total) by mouth 4 (four) times daily.   sertraline 100 MG tablet Commonly known as:  ZOLOFT Take 100 mg by mouth at bedtime.   spironolactone 25 MG tablet Commonly known as:  ALDACTONE Take 25 mg by mouth at bedtime.   Vitamin B-12 2500 MCG Subl Place 2,500 mcg under the tongue at bedtime.   VITRON-C PO Take 1  tablet by mouth at bedtime.        SignedNewman Pies D 12/26/2016, 11:03 AM

## 2016-12-26 NOTE — Evaluation (Signed)
Physical Therapy Evaluation and Discharge Patient Details Name: KETURAH YERBY MRN: [id number] DOB: 1946-02-24 Today's Date: 12/26/2016   History of Present Illness  Pt is a 71 y/o female s/p L4-L5 bilateral laminectomies and L4-L5 TLIF. PMH including but not limited to CAD, COPD, HTN, RLS and hx of THA with resection and multiple revisions.  Clinical Impression  Pt presented supine in bed with HOB elevated, awake and willing to participate in therapy session. Prior to admission, pt reported that she was mod I with use of her 3-wheeled walker for community ambulation. Pt ambulated in hallway with supervision and use of her walker this session with mild instability, but appears very close to her baseline. Pt also successfully completed stair training this session. PT provided pt with back precautions handout and reviewed in full during session. No further acute PT needs identified at this time. PT signing off.    Follow Up Recommendations Home health PT;Supervision/Assistance - 24 hour    Equipment Recommendations  None recommended by PT;Other (comment) (pt has all necessary DME at home)    Recommendations for Other Services       Precautions / Restrictions Precautions Precautions: Fall;Back Precaution Booklet Issued: Yes (comment) Precaution Comments: PT reviewed 3/3 back precautions with pt throughout session Required Braces or Orthoses: Spinal Brace Spinal Brace: Applied in sitting position Restrictions Weight Bearing Restrictions: No      Mobility  Bed Mobility Overal bed mobility: Needs Assistance Bed Mobility: Rolling;Sidelying to Sit;Sit to Sidelying Rolling: Supervision Sidelying to sit: Supervision     Sit to sidelying: Min assist General bed mobility comments: increased time, cueing for log roll technique, use of bed rails, supervision to achieve sitting EOB, min A with bilateral LEs to return to sidelying  Transfers Overall transfer level: Needs  assistance Equipment used: None Transfers: Sit to/from Stand Sit to Stand: Supervision         General transfer comment: supervision for safety  Ambulation/Gait Ambulation/Gait assistance: Supervision Ambulation Distance (Feet): 400 Feet Assistive device: Rolling walker (2 wheeled) (3-wheeled RW) Gait Pattern/deviations: Step-through pattern;Decreased step length - right;Decreased step length - left;Decreased stride length;Wide base of support;Trunk flexed Gait velocity: decreased Gait velocity interpretation: Below normal speed for age/gender General Gait Details: mild instability but no LOB or need for physical assistance, supervision for safety  Stairs Stairs: Yes Stairs assistance: Min guard Stair Management: One rail Right;Step to pattern;Forwards Number of Stairs: 1 General stair comments: mild instability but no LOB or need for physical assistance, min guard for safety  Wheelchair Mobility    Modified Rankin (Stroke Patients Only)       Balance Overall balance assessment: Needs assistance Sitting-balance support: Feet supported Sitting balance-Leahy Scale: Good     Standing balance support: During functional activity;No upper extremity supported Standing balance-Leahy Scale: Fair                               Pertinent Vitals/Pain Pain Assessment: 0-10 Pain Score: 4  Pain Location: back Pain Descriptors / Indicators: Sore Pain Intervention(s): Monitored during session;Repositioned    Home Living Family/patient expects to be discharged to:: Private residence Living Arrangements: Spouse/significant other Available Help at Discharge: Family;Available 24 hours/day Type of Home: House Home Access: Stairs to enter   [employer] of Steps: 1 Home Layout: One level Home Equipment: Walker - 2 wheels;Shower seat;Grab bars - toilet;Grab bars - tub/shower;Hand held shower head;Electric scooter;Wheelchair - power      Prior  Function Level  of Independence: Independent with assistive device(s)         Comments: pt reported that she uses her 3-wheeled walker for community ambulation     Hand Dominance        Extremity/Trunk Assessment   Upper Extremity Assessment Upper Extremity Assessment: Overall WFL for tasks assessed    Lower Extremity Assessment Lower Extremity Assessment: Overall WFL for tasks assessed    Cervical / Trunk Assessment Cervical / Trunk Assessment: Other exceptions Cervical / Trunk Exceptions: s/p lumbar sx  Communication   Communication: No difficulties  Cognition Arousal/Alertness: Awake/alert Behavior During Therapy: WFL for tasks assessed/performed Overall Cognitive Status: Within Functional Limits for tasks assessed                                        General Comments      Exercises     Assessment/Plan    PT Assessment Patent does not need any further PT services;All further PT needs can be met in the next venue of care  PT Problem List         PT Treatment Interventions      PT Goals (Current goals can be found in the Care Plan section)  Acute Rehab PT Goals Patient Stated Goal: return home    Frequency     Barriers to discharge        Co-evaluation               AM-PAC PT "6 Clicks" Daily Activity  Outcome Measure Difficulty turning over in bed (including adjusting bedclothes, sheets and blankets)?: A Little Difficulty moving from lying on back to sitting on the side of the bed? : A Little Difficulty sitting down on and standing up from a chair with arms (e.g., wheelchair, bedside commode, etc,.)?: A Little Help needed moving to and from a bed to chair (including a wheelchair)?: A Little Help needed walking in hospital room?: A Little Help needed climbing 3-5 steps with a railing? : A Little 6 Click Score: 18    End of Session Equipment Utilized During Treatment: Gait belt;Back brace Activity Tolerance: Patient tolerated treatment  well Patient left: in bed;with call bell/phone within reach Nurse Communication: Mobility status PT Visit Diagnosis: Other abnormalities of gait and mobility (R26.89);Pain Pain - part of body:  (back)    Time: 0086-7619 PT Time Calculation (min) (ACUTE ONLY): 20 min   Charges:   PT Evaluation $PT Eval Moderate Complexity: 1 Procedure     PT G Codes:        Sherie Don, PT, DPT [place] 12/26/2016, 11:06 AM

## 2016-12-26 NOTE — Progress Notes (Signed)
Patient is discharged from room 3C11 at this time. Alert and in stable condition. IV site d/c'd and instructions read to patient and spouse with understanding verbalized. Left unit via wheelchair with all belongings at side. 

## 2016-12-26 NOTE — Care Management Note (Signed)
Case Management Note  Patient Details  Name: Ann Silva MRN: [id number] Date of Birth: 11/24/1945  Subjective/Objective:     CM following for progression and d/c planning.                Action/Plan: 12/26/2016 Met with pt and husband. Pt has used [facility] in the past and wishes to use that agency again. Both pt and husband agree that this pt has no DME needs, as she has walker, elevated commode, handicap shower and walk in tub in the home. No further needs identified. [facility] notified of order and pt request for PT, Doren Custard as she has worked with PT in the past and would like to have he again for PT services.   Expected Discharge Date:  12/26/16               Expected Discharge Plan:  [place]  In-House Referral:  NA  Discharge planning Services  CM Consult  Post Acute Care Choice:  Home Health Choice offered to:  Patient  DME Arranged:  N/A DME Agency:  NA  HH Arranged:  PT [place] Agency:  [facility] (now [facility])  Status of Service:  Completed, signed off  If discussed at [employer] of Stay Meetings, dates discussed:    Additional Comments:  Adron Bene, RN 12/26/2016, 11:32 AM

## 2016-12-27 LAB — BPAM RBC
BLOOD PRODUCT EXPIRATION DATE: 201806252359
Blood Product Expiration Date: 201806252359
ISSUE DATE / TIME: 201806080733
UNIT TYPE AND RH: 6200
Unit Type and Rh: 6200

## 2016-12-27 LAB — TYPE AND SCREEN
ABO/RH(D): A POS
ANTIBODY SCREEN: NEGATIVE
UNIT DIVISION: 0
Unit division: 0

## 2016-12-27 MED FILL — Heparin Sodium (Porcine) Inj 1000 Unit/ML: INTRAMUSCULAR | Qty: 30 | Status: AC

## 2016-12-27 MED FILL — Sodium Chloride IV Soln 0.9%: INTRAVENOUS | Qty: 1000 | Status: AC

## 2016-12-28 ENCOUNTER — Encounter (HOSPITAL_COMMUNITY): Payer: Self-pay | Admitting: *Deleted

## 2016-12-28 ENCOUNTER — Emergency Department (HOSPITAL_COMMUNITY): Payer: Medicare Other

## 2016-12-28 ENCOUNTER — Inpatient Hospital Stay (HOSPITAL_COMMUNITY)
Admission: EM | Admit: 2016-12-28 | Discharge: 2017-01-01 | DRG: 176 | Disposition: A | Discharge status: Skilled Nursing Facility | Payer: Medicare Other | Attending: Family Medicine | Admitting: Family Medicine

## 2016-12-28 DIAGNOSIS — F17210 Nicotine dependence, cigarettes, uncomplicated: Secondary | ICD-10-CM | POA: Diagnosis present

## 2016-12-28 DIAGNOSIS — M1990 Unspecified osteoarthritis, unspecified site: Secondary | ICD-10-CM | POA: Diagnosis present

## 2016-12-28 DIAGNOSIS — R251 Tremor, unspecified: Secondary | ICD-10-CM | POA: Diagnosis not present

## 2016-12-28 DIAGNOSIS — I10 Essential (primary) hypertension: Secondary | ICD-10-CM | POA: Diagnosis present

## 2016-12-28 DIAGNOSIS — Z79899 Other long term (current) drug therapy: Secondary | ICD-10-CM

## 2016-12-28 DIAGNOSIS — G8929 Other chronic pain: Secondary | ICD-10-CM | POA: Diagnosis not present

## 2016-12-28 DIAGNOSIS — M5116 Intervertebral disc disorders with radiculopathy, lumbar region: Secondary | ICD-10-CM | POA: Diagnosis not present

## 2016-12-28 DIAGNOSIS — I248 Other forms of acute ischemic heart disease: Secondary | ICD-10-CM | POA: Diagnosis not present

## 2016-12-28 DIAGNOSIS — I2510 Atherosclerotic heart disease of native coronary artery without angina pectoris: Secondary | ICD-10-CM | POA: Diagnosis present

## 2016-12-28 DIAGNOSIS — F329 Major depressive disorder, single episode, unspecified: Secondary | ICD-10-CM | POA: Diagnosis present

## 2016-12-28 DIAGNOSIS — I110 Hypertensive heart disease with heart failure: Secondary | ICD-10-CM | POA: Diagnosis not present

## 2016-12-28 DIAGNOSIS — Z85038 Personal history of other malignant neoplasm of large intestine: Secondary | ICD-10-CM

## 2016-12-28 DIAGNOSIS — N179 Acute kidney failure, unspecified: Secondary | ICD-10-CM | POA: Diagnosis not present

## 2016-12-28 DIAGNOSIS — F410 Panic disorder [episodic paroxysmal anxiety] without agoraphobia: Secondary | ICD-10-CM | POA: Diagnosis present

## 2016-12-28 DIAGNOSIS — G2581 Restless legs syndrome: Secondary | ICD-10-CM | POA: Diagnosis present

## 2016-12-28 DIAGNOSIS — G4730 Sleep apnea, unspecified: Secondary | ICD-10-CM | POA: Diagnosis present

## 2016-12-28 DIAGNOSIS — Z981 Arthrodesis status: Secondary | ICD-10-CM

## 2016-12-28 DIAGNOSIS — R29898 Other symptoms and signs involving the musculoskeletal system: Secondary | ICD-10-CM

## 2016-12-28 DIAGNOSIS — D638 Anemia in other chronic diseases classified elsewhere: Secondary | ICD-10-CM | POA: Diagnosis present

## 2016-12-28 DIAGNOSIS — I509 Heart failure, unspecified: Secondary | ICD-10-CM | POA: Diagnosis not present

## 2016-12-28 DIAGNOSIS — M48062 Spinal stenosis, lumbar region with neurogenic claudication: Secondary | ICD-10-CM | POA: Diagnosis not present

## 2016-12-28 DIAGNOSIS — I2699 Other pulmonary embolism without acute cor pulmonale: Principal | ICD-10-CM

## 2016-12-28 DIAGNOSIS — J440 Chronic obstructive pulmonary disease with acute lower respiratory infection: Secondary | ICD-10-CM | POA: Diagnosis not present

## 2016-12-28 DIAGNOSIS — Z96643 Presence of artificial hip joint, bilateral: Secondary | ICD-10-CM | POA: Diagnosis present

## 2016-12-28 DIAGNOSIS — R0602 Shortness of breath: Secondary | ICD-10-CM | POA: Diagnosis not present

## 2016-12-28 DIAGNOSIS — Z4789 Encounter for other orthopedic aftercare: Secondary | ICD-10-CM | POA: Diagnosis not present

## 2016-12-28 DIAGNOSIS — M4316 Spondylolisthesis, lumbar region: Secondary | ICD-10-CM | POA: Diagnosis not present

## 2016-12-28 DIAGNOSIS — J441 Chronic obstructive pulmonary disease with (acute) exacerbation: Secondary | ICD-10-CM | POA: Diagnosis not present

## 2016-12-28 DIAGNOSIS — E871 Hypo-osmolality and hyponatremia: Secondary | ICD-10-CM | POA: Diagnosis not present

## 2016-12-28 DIAGNOSIS — Y95 Nosocomial condition: Secondary | ICD-10-CM | POA: Diagnosis present

## 2016-12-28 DIAGNOSIS — J449 Chronic obstructive pulmonary disease, unspecified: Secondary | ICD-10-CM | POA: Diagnosis not present

## 2016-12-28 DIAGNOSIS — J189 Pneumonia, unspecified organism: Secondary | ICD-10-CM | POA: Diagnosis not present

## 2016-12-28 DIAGNOSIS — R0600 Dyspnea, unspecified: Secondary | ICD-10-CM | POA: Diagnosis present

## 2016-12-28 DIAGNOSIS — I890 Lymphedema, not elsewhere classified: Secondary | ICD-10-CM | POA: Diagnosis present

## 2016-12-28 DIAGNOSIS — F419 Anxiety disorder, unspecified: Secondary | ICD-10-CM | POA: Diagnosis present

## 2016-12-28 LAB — COMPREHENSIVE METABOLIC PANEL
ALBUMIN: 2.8 g/dL — AB (ref 3.5–5.0)
ALT: 14 U/L (ref 14–54)
AST: 29 U/L (ref 15–41)
Alkaline Phosphatase: 83 U/L (ref 38–126)
Anion gap: 8 (ref 5–15)
BUN: 21 mg/dL — AB (ref 6–20)
CHLORIDE: 101 mmol/L (ref 101–111)
CO2: 25 mmol/L (ref 22–32)
CREATININE: 1.06 mg/dL — AB (ref 0.44–1.00)
Calcium: 9.5 mg/dL (ref 8.9–10.3)
GFR calc Af Amer: 59 mL/min — ABNORMAL LOW (ref >60)
GFR, EST NON AFRICAN AMERICAN: 51 mL/min — AB (ref >60)
GLUCOSE: 160 mg/dL — AB (ref 65–99)
POTASSIUM: 3.4 mmol/L — AB (ref 3.5–5.1)
Sodium: 134 mmol/L — ABNORMAL LOW (ref 135–145)
Total Bilirubin: 0.9 mg/dL (ref 0.3–1.2)
Total Protein: 6.2 g/dL — ABNORMAL LOW (ref 6.5–8.1)

## 2016-12-28 LAB — CBC WITH DIFFERENTIAL/PLATELET
Basophils Absolute: 0 10*3/uL (ref 0.0–0.1)
Basophils Relative: 0 %
EOS ABS: 0.1 10*3/uL (ref 0.0–0.7)
EOS PCT: 1 %
HCT: 29.5 % — ABNORMAL LOW (ref 36.0–46.0)
Hemoglobin: 9.6 g/dL — ABNORMAL LOW (ref 12.0–15.0)
LYMPHS ABS: 1 10*3/uL (ref 0.7–4.0)
LYMPHS PCT: 12 %
MCH: 30.7 pg (ref 26.0–34.0)
MCHC: 32.5 g/dL (ref 30.0–36.0)
MCV: 94.2 fL (ref 78.0–100.0)
MONO ABS: 0.6 10*3/uL (ref 0.1–1.0)
MONOS PCT: 7 %
Neutro Abs: 7.3 10*3/uL (ref 1.7–7.7)
Neutrophils Relative %: 80 %
PLATELETS: 147 10*3/uL — AB (ref 150–400)
RBC: 3.13 MIL/uL — ABNORMAL LOW (ref 3.87–5.11)
RDW: 14.5 % (ref 11.5–15.5)
WBC: 9 10*3/uL (ref 4.0–10.5)

## 2016-12-28 LAB — PROTIME-INR
INR: 1.19
PROTHROMBIN TIME: 15.2 s (ref 11.4–15.2)

## 2016-12-28 LAB — I-STAT TROPONIN, ED: Troponin i, poc: 0.35 ng/mL (ref 0.00–0.08)

## 2016-12-28 LAB — I-STAT CG4 LACTIC ACID, ED: Lactic Acid, Venous: 1.45 mmol/L (ref 0.5–1.9)

## 2016-12-28 MED ORDER — HYDROMORPHONE HCL 1 MG/ML IJ SOLN
1.0000 mg | Freq: Once | INTRAMUSCULAR | Status: AC
  Administered 2016-12-29: 1 mg via INTRAVENOUS
  Filled 2016-12-28: qty 1

## 2016-12-28 MED ORDER — FUROSEMIDE 10 MG/ML IJ SOLN
20.0000 mg | Freq: Once | INTRAMUSCULAR | Status: AC
  Administered 2016-12-28: 20 mg via INTRAVENOUS
  Filled 2016-12-28: qty 2

## 2016-12-28 MED ORDER — VANCOMYCIN HCL IN DEXTROSE 750-5 MG/150ML-% IV SOLN
750.0000 mg | Freq: Two times a day (BID) | INTRAVENOUS | Status: DC
Start: 2016-12-28 — End: 2016-12-30
  Administered 2016-12-28 – 2016-12-29 (×3): 750 mg via INTRAVENOUS
  Filled 2016-12-28 (×5): qty 150

## 2016-12-28 MED ORDER — DEXTROSE 5 % IV SOLN
1.0000 g | Freq: Once | INTRAVENOUS | Status: AC
  Administered 2016-12-28: 1 g via INTRAVENOUS
  Filled 2016-12-28: qty 1

## 2016-12-28 MED ORDER — ACETAMINOPHEN 500 MG PO TABS
1000.0000 mg | ORAL_TABLET | Freq: Once | ORAL | Status: AC
  Administered 2016-12-28: 1000 mg via ORAL
  Filled 2016-12-28: qty 2

## 2016-12-28 MED ORDER — SODIUM CHLORIDE 0.9 % IV BOLUS (SEPSIS)
500.0000 mL | Freq: Once | INTRAVENOUS | Status: AC
  Administered 2016-12-29: 500 mL via INTRAVENOUS

## 2016-12-28 NOTE — ED Notes (Signed)
Dr.Mu aware of temperature

## 2016-12-28 NOTE — ED Provider Notes (Signed)
[facility] DEPT Provider Note   CSN: [id number] Arrival date & time: 12/28/16  1936     History   Chief Complaint Chief Complaint  Patient presents with  . Shortness of Breath    HPI Ann Silva is a 71 y.o. female.  The history is provided by the patient. No language interpreter was used.  Shortness of Breath  This is a new problem. The average episode lasts 7 hours. The problem occurs continuously.The current episode started 6 to 12 hours ago. The problem has not changed since onset.Associated symptoms include a fever and leg swelling. Pertinent negatives include no sore throat, no ear pain, no cough, no chest pain, no vomiting, no abdominal pain and no rash. Precipitated by: recent surgery, not taking lasix. Treatments tried: CPAP by EMS. The treatment provided mild relief. She has had prior hospitalizations. She has had prior ED visits. Associated medical issues include COPD and heart failure.    Past Medical History:  Diagnosis Date  . Anemia    takes ferrous sulfate daily  . Anxiety   . Arthritis   . Bladder spasm   . Chronic back pain    spinal stenosis  . Colon cancer ([place])   . Complication of anesthesia    B/P bottoms out, hard to wake up   . Contact dermatitis   . COPD (chronic obstructive pulmonary disease) (HCC)    Albuterol inhaler and DuoNeb as needed  . Coronary artery disease    mild by 08/25/09 cath  . Headache   . Heart murmur    never had any problems  . History of blood transfusion    no abnormal reaction noted  . History of bronchitis   . History of MRSA infection 6+ yrs ago  . Hypertension    takes Metoprolol daily  . Insomnia    takes Trazodone nightly  . Joint pain   . Joint swelling   . Nocturia   . Panic attacks    takes Effexor daily  . Peripheral edema    takes Lasix daily and takes Bumex every 3 days.Takes Aldactone daily   . Restless leg syndrome    takes Requip daily  . RLS (restless legs syndrome)   . Sleep apnea    doesnt use CPAP    Patient Active Problem List   Diagnosis Date Noted  . Shortness of breath 12/29/2016  . Lumbar adjacent segment disease with spondylolisthesis 12/25/2016  . COPD with acute exacerbation ([place]) 11/27/2015  . COPD exacerbation ([place]) 08/21/2015  . Restless legs 02/27/2015  . Hypertension   . Colon cancer ([place])   . Depression   . History of repair of hip joint 10/25/2013    Past Surgical History:  Procedure Laterality Date  . ABDOMINAL HYSTERECTOMY    . CHOLECYSTECTOMY    . COLON SURGERY     partial colectomy d/t cancer cells  . COLONOSCOPY    . I&D of left hip      x 4  . JOINT REPLACEMENT    . Resection total hip    . Revision of total hip arthroplasty     x3-4  . right hip replacement    . TOTAL HIP ARTHROPLASTY Left     OB History    No data available       Home Medications    Prior to Admission medications   Medication Sig Start Date End Date Taking? Authorizing Provider  acetaminophen (TYLENOL) 500 MG tablet Take 1,000 mg by mouth every 6 (  six) hours as needed (for pain/back pain.).   Yes [provider]  albuterol (PROVENTIL HFA;VENTOLIN HFA) 108 (90 Base) MCG/ACT inhaler Inhale 2 puffs into the lungs every 4 (four) hours as needed for wheezing or shortness of breath. 12/05/15  Yes Wieting, Richard, MD  budesonide-formoterol [facility]) 160-4.5 MCG/ACT inhaler Inhale 2 puffs into the lungs 2 (two) times daily. Patient taking differently: Inhale 2 puffs into the lungs 2 (two) times daily as needed (for respiratory issues).  12/05/15  Yes Wieting, Richard, MD  furosemide (LASIX) 20 MG tablet Take 1 tablet (20 mg total) by mouth daily. Patient taking differently: Take 20 mg by mouth 2 (two) times daily.  04/29/16 04/29/17 Yes Lavonia Drafts, MD  HYDROmorphone (DILAUDID) 4 MG tablet Take 1 tablet (4 mg total) by mouth every 6 (six) hours as needed for severe pain. 12/26/16  Yes Newman Pies, MD  ipratropium-albuterol (DUONEB) 0.5-2.5 (3)  MG/3ML SOLN Take 3 mLs by nebulization every 6 (six) hours as needed (for wheezing/shortness of breath).   Yes [provider]  bumetanide (BUMEX) 2 MG tablet Take 2 mg by mouth every 3 (three) days. 05/09/16   [provider]  Calcium Carbonate-Vit D-Min (CALTRATE 600+D PLUS PO) Take 1 tablet by mouth 2 (two) times daily.    [provider]  Cyanocobalamin (VITAMIN B-12) 2500 MCG SUBL Place 2,500 mcg under the tongue at bedtime.    [provider]  cyclobenzaprine (FLEXERIL) 10 MG tablet Take 1 tablet (10 mg total) by mouth 3 (three) times daily as needed for muscle spasms. 12/26/16   Newman Pies, MD  docusate sodium (COLACE) 100 MG capsule Take 1 capsule (100 mg total) by mouth 2 (two) times daily. 12/26/16   Newman Pies, MD  Emollient (CERAVE) CREA Apply 1 application topically 2 (two) times daily. Applied neck down for dermatitis    [provider]  fluticasone (FLONASE) 50 MCG/ACT nasal spray Place 1 spray into both nostrils daily as needed (for sinus issues).     [provider]  Iron-Vitamin C (VITRON-C PO) Take 1 tablet by mouth at bedtime.    [provider]  magic mouthwash SOLN Take 5 mLs by mouth 3 (three) times daily as needed for mouth pain. 01/10/16   Volney American, PA-C  metoprolol succinate (TOPROL-XL) 50 MG 24 hr tablet Take 1 tablet (50 mg total) by mouth every evening. Take with or immediately following a meal. Patient taking differently: Take 50 mg by mouth at bedtime. Take with or immediately following a meal. 12/26/15   Crissman, Jeannette How, MD  mometasone (ELOCON) 0.1 % cream Apply 1 application topically 2 (two) times daily. For dermatitis 04/24/16   [provider]  Multiple Vitamins-Minerals (MULTIVITAMIN GUMMIES WOMENS PO) Take 2 tablets by mouth at bedtime.    [provider]  mupirocin ointment (BACTROBAN) 2 % Apply 1 application topically 2 (two) times daily as needed. For skin  infection. 10/03/16   [provider]  ondansetron (ZOFRAN ODT) 4 MG disintegrating tablet Take 1 tablet (4 mg total) by mouth every 8 (eight) hours as needed for nausea or vomiting. 01/10/16   Volney American, PA-C  rOPINIRole (REQUIP) 3 MG tablet Take 1 tablet (3 mg total) by mouth 4 (four) times daily. 04/20/15   [name], MD  sertraline (ZOLOFT) 100 MG tablet Take 100 mg by mouth at bedtime.     [provider]  spironolactone (ALDACTONE) 25 MG tablet Take 25 mg by mouth at bedtime.  05/24/16   [provider]    Family History Family History  Problem Relation Age of Onset  . Cancer Mother   . Stroke Father   . Diabetes Sister   . Mental illness Sister     Social History Social History  Substance Use Topics  . Smoking status: Current Every Day Smoker    Packs/day: 0.75    Years: 51.00    Types: Cigarettes    Start date: 07/08/1964  . Smokeless tobacco: Never Used  . Alcohol use No     Allergies   Hydrocodone-chlorpheniramine; Prednisone; Sulfa antibiotics; Chlorhexidine; Methadone; Benadryl [diphenhydramine]; Codeine; Diphenhydramine-zinc acetate; Latex; and Tape   Review of Systems Review of Systems  Constitutional: Positive for fever. Negative for chills.  HENT: Negative for ear pain and sore throat.   Eyes: Negative for pain and visual disturbance.  Respiratory: Positive for shortness of breath. Negative for cough.   Cardiovascular: Positive for leg swelling. Negative for chest pain and palpitations.  Gastrointestinal: Negative for abdominal pain and vomiting.  Genitourinary: Negative for dysuria and hematuria.  Musculoskeletal: Negative for arthralgias.  Skin: Positive for wound. Negative for color change and rash.  Neurological: Negative for seizures and syncope.  All other systems reviewed and are negative.    Physical Exam Updated Vital Signs BP 119/82   Pulse 72   Temp 97.1 F (36.2 C) (Oral)   Resp 13   Ht 5\' 1"   (1.549 m)   Wt 84.4 kg (186 lb)   SpO2 96%   BMI 35.14 kg/m   Physical Exam  Constitutional: She appears well-developed. No distress.  HENT:  Head: Normocephalic and atraumatic.  Eyes: Conjunctivae are normal.  Neck: Neck supple.  Cardiovascular: Regular rhythm.  Tachycardia present.   No murmur heard. Pulmonary/Chest: Tachypnea noted. She has rales (Diffuse crackles).  Abdominal: Soft. There is no tenderness.  Musculoskeletal: She exhibits edema (2+ BLE edema).  Recent lumbar spine surgical scar well healing. No erythema or drainage  Neurological: She is alert. No cranial nerve deficit. Coordination normal.  5/5 motor strength and intact sensation in all extremities. Finger-to-nose intact bilaterally  Skin: Skin is warm and dry.  Nursing note and vitals reviewed.    ED Treatments / Results  Labs (all labs ordered are listed, but only abnormal results are displayed) Labs Reviewed  COMPREHENSIVE METABOLIC PANEL - Abnormal; Notable for the following:       Result Value   Sodium 134 (*)    Potassium 3.4 (*)    Glucose, Bld 160 (*)    BUN 21 (*)    Creatinine, Ser 1.06 (*)    Total Protein 6.2 (*)    Albumin 2.8 (*)    GFR calc non Af Amer 51 (*)    GFR calc Af Amer 59 (*)    All other components within normal limits  CBC WITH DIFFERENTIAL/PLATELET - Abnormal; Notable for the following:    RBC 3.13 (*)    Hemoglobin 9.6 (*)    HCT 29.5 (*)    Platelets 147 (*)    All other components within normal limits  TROPONIN I - Abnormal; Notable for the following:    Troponin I 0.39 (*)    All other components within normal limits  BRAIN NATRIURETIC PEPTIDE - Abnormal; Notable for the following:    B Natriuretic Peptide 396.1 (*)    All other components within normal limits  D-DIMER, QUANTITATIVE (NOT AT [facility]) - Abnormal; Notable for the following:    D-Dimer, Quant  2.46 (*)    All other components within normal limits  I-STAT TROPOININ, ED - Abnormal; Notable for the  following:    Troponin i, poc 0.35 (*)    All other components within normal limits  CULTURE, BLOOD (ROUTINE X 2)  CULTURE, BLOOD (ROUTINE X 2)  MRSA PCR SCREENING  PROTIME-INR  TROPONIN I  TROPONIN I  HEMOGLOBIN A1C  COMPREHENSIVE METABOLIC PANEL  CBC  I-STAT CG4 LACTIC ACID, ED    EKG  EKG Interpretation  Date/Time:  Saturday December 28 2016 19:45:08 EDT Ventricular Rate:  111 PR Interval:    QRS Duration: 114 QT Interval:  333 QTC Calculation: 453 R Axis:   61 Text Interpretation:  Sinus tachycardia Incomplete right bundle branch block Low voltage, precordial leads SINCE LAST TRACING HEART RATE HAS INCREASED Confirmed by Malvin Johns [phone]) on 12/28/2016 8:05:36 PM       Radiology Dg Chest Portable 1 View  Result Date: 12/28/2016 CLINICAL DATA:  Shortness of breath. EXAM: PORTABLE CHEST 1 VIEW COMPARISON:  Chest x-rays from April 29, 2016 and August 13, 2016 FINDINGS: Evaluation of cardiac size is limited due to portable technique but the heart appears larger in the interval. The hila are symmetric. The mediastinum is unremarkable. No pneumothorax. Increased interstitial markings in the interval. No focal infiltrate. IMPRESSION: 1. The heart appears larger in the interval but this could be at least partially due to the portable technique. However, there is increased interstitial opacity bilaterally which could represent edema or atypical infection. Recommend a PA and lateral chest x-ray for better evaluation. Electronically Signed   By: Dorise Bullion III M.D   On: 12/28/2016 20:00    Procedures Procedures (including critical care time)  Medications Ordered in ED Medications  vancomycin (VANCOCIN) IVPB 750 mg/150 ml premix (0 mg Intravenous Stopped 12/28/16 2235)  cyclobenzaprine (FLEXERIL) tablet 10 mg (not administered)  HYDROmorphone (DILAUDID) tablet 4 mg (not administered)  sertraline (ZOLOFT) tablet 100 mg (100 mg Oral Given 12/29/16 0210)  rOPINIRole (REQUIP)  tablet 3 mg (3 mg Oral Given 12/29/16 0210)  enoxaparin (LOVENOX) injection 40 mg (not administered)  acetaminophen (TYLENOL) tablet 650 mg (not administered)    Or  acetaminophen (TYLENOL) suppository 650 mg (not administered)  senna (SENOKOT) tablet 8.6 mg (8.6 mg Oral Given 12/29/16 0210)  polyethylene glycol (MIRALAX / GLYCOLAX) packet 17 g (not administered)  ondansetron (ZOFRAN) tablet 4 mg (not administered)    Or  ondansetron (ZOFRAN) injection 4 mg (not administered)  potassium chloride SA (K-DUR,KLOR-CON) CR tablet 40 mEq (40 mEq Oral Given 12/29/16 0210)  ceFEPIme (MAXIPIME) 1 g in dextrose 5 % 50 mL IVPB (not administered)  nicotine (NICODERM CQ - dosed in mg/24 hours) patch 14 mg (not administered)  ipratropium-albuterol (DUONEB) 0.5-2.5 (3) MG/3ML nebulizer solution 3 mL (3 mLs Nebulization Given 12/29/16 0328)  furosemide (LASIX) injection 20 mg (20 mg Intravenous Given 12/28/16 2039)  ceFEPIme (MAXIPIME) 1 g in dextrose 5 % 50 mL IVPB (0 g Intravenous Stopped 12/28/16 2131)  acetaminophen (TYLENOL) tablet 1,000 mg (1,000 mg Oral Given 12/28/16 2100)  HYDROmorphone (DILAUDID) injection 1 mg (1 mg Intravenous Given 12/29/16 0004)  sodium chloride 0.9 % bolus 500 mL (0 mLs Intravenous Stopped 12/29/16 0035)     Initial Impression / Assessment and Plan / ED Course  I have reviewed the triage vital signs and the nursing notes.  Pertinent labs & imaging results that were available during my care of the patient were reviewed by me and considered in  my medical decision making (see chart for details).      71 year old female history of COPD, CAD, HTN, colon cancer, DDD S/P recent lumbar fusion 3 days ago who presents via EMS on CPAP for respiratory distress.  Patient with onset of shortness of breath over the past 7 hours.  She worked earlier today with physical therapy but was cut short due to shortness of breath.  Patient has been off Lasix over the past week after recent lumbar  surgery.  She denies any drainage or increased lower back pain.  She has noted increased swelling of her lower legs.  She is found to be febrile on arrival and tachycardic. Sepsis labs sent  Febrile 101.7, tachycardic 112, otherwise VSS.  Lungs with diffuse crackles.  Abdomen soft benign throughout.  Recent lumbar surgical site well-healing without erythema or drainage.  CXR showing increased interstitial opacity possibly representing edema versus atypical infection.  Antibiotics given for HAP.  Lasix given for suspected CHF exacerbation.  Labs significant for elevated troponin 0.35, likely demand ischemia from ointment overload.  Patient without chest pain.  EKG showing sinus tachycardia and incomplete right bundle branch block.  Patient wean off BiPAP.  Blood pressures downtrending after receiving Lasix.  Patient given 500 cc of normal saline with response of BP.  Suspect intravascular depletion with overall hypervolemia.  Patient admitted to family medicine for further management and evaluation.  Patient stable at time of transfer.  Pt care d/w Dr. Tamera Punt  Final Clinical Impressions(s) / ED Diagnoses   Final diagnoses:  HCAP (healthcare-associated pneumonia)  SOB (shortness of breath)  Acute congestive heart failure, unspecified heart failure type [facility])    New Prescriptions Current Discharge Medication List       Payton Emerald, MD 12/29/16 0505    Malvin Johns, MD 12/29/16 [phone]

## 2016-12-28 NOTE — ED Notes (Signed)
Admitting MDs at bedside.

## 2016-12-28 NOTE — ED Notes (Signed)
Both sets of cultures drawn prior to antiobiotics

## 2016-12-28 NOTE — ED Triage Notes (Signed)
Pt to ED from home by EMS c/o sob x 7 hours. Recent lumbar and thoracic surgery on 6/20. Has been off of her lasix x 1 week. EMS reported decreased and wheezing throughout. Reports feeling better with CPAP that was placed by EMS. Pt also had a staph infection, but "had to stop taking that medication. Initial sats 88% on a nasal cannual that another medic had placed pt on (pt is not usually on oxygen). Duonebs x2 given en route

## 2016-12-28 NOTE — Progress Notes (Signed)
Pharmacy Antibiotic Note  Ann Silva is a 71 y.o. female admitted on 12/28/2016 with pneumonia.  Pharmacy has been consulted for vancomycin dosing. Febrile to 101.61F, WBC mildly elevated, lactate wnl. No other labs yet; however, creatinine was 0.87 (estimated CrCl 50-55 mL/min) on 12/26/16. No cultures ordered yet.   Plan: Vancomycin 750mg  IV every 12 hours.  Goal trough 15-20 mcg/mL.  F/u C/S, clinical status, VT prn, ability to de-escalate   Height: 5\' 1"  (154.9 cm) Weight: 185 lb (83.9 kg) IBW/kg (Calculated) : 47.8  Temp (24hrs), Avg:101.7 F (38.7 C), Min:101.7 F (38.7 C), Max:101.7 F (38.7 C)   Recent Labs Lab 12/26/16 0402 12/28/16 2005  WBC 11.5*  --   CREATININE 0.87  --   LATICACIDVEN  --  1.45    Estimated Creatinine Clearance: 57.4 mL/min (by C-G formula based on SCr of 0.87 mg/dL).    Allergies  Allergen Reactions  . Hydrocodone-Chlorpheniramine Shortness Of Breath  . Prednisone Shortness Of Breath    Patient states she can take this now  . Chlorhexidine Rash  . Methadone Other (See Comments)    Reaction: Altered Mental Status  . Sulfa Antibiotics Other (See Comments)    Reaction:  Unknown   . Benadryl [Diphenhydramine] Anxiety  . Codeine Anxiety  . Diphenhydramine-Zinc Acetate Anxiety    Antimicrobials this admission: 6/23 vanc >>  6/23 cefepime >>   Microbiology results: 6/11 MRSA PCR: Positive  Thank you for allowing pharmacy to be a part of this patient's care.  Dierdre Harness, [place], PharmD Clinical Pharmacy Resident [phone] (Pager) 12/28/2016 8:20 PM

## 2016-12-29 ENCOUNTER — Encounter (HOSPITAL_COMMUNITY): Payer: Self-pay | Admitting: *Deleted

## 2016-12-29 ENCOUNTER — Inpatient Hospital Stay (HOSPITAL_COMMUNITY): Payer: Medicare Other

## 2016-12-29 DIAGNOSIS — R0602 Shortness of breath: Secondary | ICD-10-CM | POA: Diagnosis not present

## 2016-12-29 DIAGNOSIS — I2699 Other pulmonary embolism without acute cor pulmonale: Secondary | ICD-10-CM | POA: Diagnosis not present

## 2016-12-29 DIAGNOSIS — G2581 Restless legs syndrome: Secondary | ICD-10-CM | POA: Diagnosis not present

## 2016-12-29 DIAGNOSIS — G4730 Sleep apnea, unspecified: Secondary | ICD-10-CM | POA: Diagnosis present

## 2016-12-29 DIAGNOSIS — J440 Chronic obstructive pulmonary disease with acute lower respiratory infection: Secondary | ICD-10-CM | POA: Diagnosis not present

## 2016-12-29 DIAGNOSIS — M4326 Fusion of spine, lumbar region: Secondary | ICD-10-CM | POA: Diagnosis not present

## 2016-12-29 DIAGNOSIS — R2681 Unsteadiness on feet: Secondary | ICD-10-CM | POA: Diagnosis not present

## 2016-12-29 DIAGNOSIS — R488 Other symbolic dysfunctions: Secondary | ICD-10-CM | POA: Diagnosis not present

## 2016-12-29 DIAGNOSIS — I509 Heart failure, unspecified: Secondary | ICD-10-CM | POA: Diagnosis not present

## 2016-12-29 DIAGNOSIS — M1990 Unspecified osteoarthritis, unspecified site: Secondary | ICD-10-CM | POA: Diagnosis present

## 2016-12-29 DIAGNOSIS — I890 Lymphedema, not elsewhere classified: Secondary | ICD-10-CM | POA: Diagnosis not present

## 2016-12-29 DIAGNOSIS — J441 Chronic obstructive pulmonary disease with (acute) exacerbation: Secondary | ICD-10-CM | POA: Diagnosis not present

## 2016-12-29 DIAGNOSIS — Z96643 Presence of artificial hip joint, bilateral: Secondary | ICD-10-CM | POA: Diagnosis present

## 2016-12-29 DIAGNOSIS — E871 Hypo-osmolality and hyponatremia: Secondary | ICD-10-CM | POA: Diagnosis not present

## 2016-12-29 DIAGNOSIS — F329 Major depressive disorder, single episode, unspecified: Secondary | ICD-10-CM | POA: Diagnosis present

## 2016-12-29 DIAGNOSIS — I2510 Atherosclerotic heart disease of native coronary artery without angina pectoris: Secondary | ICD-10-CM | POA: Diagnosis not present

## 2016-12-29 DIAGNOSIS — F419 Anxiety disorder, unspecified: Secondary | ICD-10-CM | POA: Diagnosis present

## 2016-12-29 DIAGNOSIS — Z79899 Other long term (current) drug therapy: Secondary | ICD-10-CM | POA: Diagnosis not present

## 2016-12-29 DIAGNOSIS — R262 Difficulty in walking, not elsewhere classified: Secondary | ICD-10-CM | POA: Diagnosis not present

## 2016-12-29 DIAGNOSIS — Z86711 Personal history of pulmonary embolism: Secondary | ICD-10-CM | POA: Diagnosis not present

## 2016-12-29 DIAGNOSIS — I10 Essential (primary) hypertension: Secondary | ICD-10-CM | POA: Diagnosis present

## 2016-12-29 DIAGNOSIS — D638 Anemia in other chronic diseases classified elsewhere: Secondary | ICD-10-CM | POA: Diagnosis present

## 2016-12-29 DIAGNOSIS — J189 Pneumonia, unspecified organism: Secondary | ICD-10-CM | POA: Diagnosis not present

## 2016-12-29 DIAGNOSIS — R251 Tremor, unspecified: Secondary | ICD-10-CM

## 2016-12-29 DIAGNOSIS — Y95 Nosocomial condition: Secondary | ICD-10-CM | POA: Diagnosis present

## 2016-12-29 DIAGNOSIS — R0600 Dyspnea, unspecified: Secondary | ICD-10-CM | POA: Diagnosis present

## 2016-12-29 DIAGNOSIS — N179 Acute kidney failure, unspecified: Secondary | ICD-10-CM | POA: Diagnosis not present

## 2016-12-29 DIAGNOSIS — Z4889 Encounter for other specified surgical aftercare: Secondary | ICD-10-CM | POA: Diagnosis not present

## 2016-12-29 DIAGNOSIS — Z85038 Personal history of other malignant neoplasm of large intestine: Secondary | ICD-10-CM | POA: Diagnosis not present

## 2016-12-29 DIAGNOSIS — I2609 Other pulmonary embolism with acute cor pulmonale: Secondary | ICD-10-CM | POA: Diagnosis not present

## 2016-12-29 DIAGNOSIS — M6281 Muscle weakness (generalized): Secondary | ICD-10-CM | POA: Diagnosis not present

## 2016-12-29 DIAGNOSIS — S29002A Unspecified injury of muscle and tendon of back wall of thorax, initial encounter: Secondary | ICD-10-CM | POA: Diagnosis not present

## 2016-12-29 DIAGNOSIS — M549 Dorsalgia, unspecified: Secondary | ICD-10-CM | POA: Diagnosis not present

## 2016-12-29 DIAGNOSIS — Z981 Arthrodesis status: Secondary | ICD-10-CM | POA: Diagnosis not present

## 2016-12-29 DIAGNOSIS — F17210 Nicotine dependence, cigarettes, uncomplicated: Secondary | ICD-10-CM | POA: Diagnosis present

## 2016-12-29 DIAGNOSIS — I248 Other forms of acute ischemic heart disease: Secondary | ICD-10-CM | POA: Diagnosis not present

## 2016-12-29 DIAGNOSIS — F410 Panic disorder [episodic paroxysmal anxiety] without agoraphobia: Secondary | ICD-10-CM | POA: Diagnosis present

## 2016-12-29 LAB — COMPREHENSIVE METABOLIC PANEL
ALK PHOS: 72 U/L (ref 38–126)
ALT: 14 U/L (ref 14–54)
AST: 22 U/L (ref 15–41)
Albumin: 2.4 g/dL — ABNORMAL LOW (ref 3.5–5.0)
Anion gap: 7 (ref 5–15)
BILIRUBIN TOTAL: 0.7 mg/dL (ref 0.3–1.2)
BUN: 23 mg/dL — AB (ref 6–20)
CALCIUM: 9.1 mg/dL (ref 8.9–10.3)
CO2: 24 mmol/L (ref 22–32)
CREATININE: 0.9 mg/dL (ref 0.44–1.00)
Chloride: 102 mmol/L (ref 101–111)
GFR calc Af Amer: >60 mL/min (ref >60)
GLUCOSE: 146 mg/dL — AB (ref 65–99)
POTASSIUM: 3.6 mmol/L (ref 3.5–5.1)
Sodium: 133 mmol/L — ABNORMAL LOW (ref 135–145)
TOTAL PROTEIN: 5.7 g/dL — AB (ref 6.5–8.1)

## 2016-12-29 LAB — CBC
HCT: 27.6 % — ABNORMAL LOW (ref 36.0–46.0)
Hemoglobin: 8.8 g/dL — ABNORMAL LOW (ref 12.0–15.0)
MCH: 30.3 pg (ref 26.0–34.0)
MCHC: 31.9 g/dL (ref 30.0–36.0)
MCV: 95.2 fL (ref 78.0–100.0)
PLATELETS: 141 10*3/uL — AB (ref 150–400)
RBC: 2.9 MIL/uL — ABNORMAL LOW (ref 3.87–5.11)
RDW: 14.5 % (ref 11.5–15.5)
WBC: 7.1 10*3/uL (ref 4.0–10.5)

## 2016-12-29 LAB — BRAIN NATRIURETIC PEPTIDE: B NATRIURETIC PEPTIDE 5: 396.1 pg/mL — AB (ref 0.0–100.0)

## 2016-12-29 LAB — GLUCOSE, CAPILLARY
GLUCOSE-CAPILLARY: 140 mg/dL — AB (ref 65–99)
Glucose-Capillary: 131 mg/dL — ABNORMAL HIGH (ref 65–99)

## 2016-12-29 LAB — TROPONIN I
TROPONIN I: 0.26 ng/mL — AB (ref <0.03)
Troponin I: 0.39 ng/mL (ref <0.03)
Troponin I: 0.2 ng/mL (ref <0.03)

## 2016-12-29 LAB — HEPARIN LEVEL (UNFRACTIONATED): HEPARIN UNFRACTIONATED: 0.23 [IU]/mL — AB (ref 0.30–0.70)

## 2016-12-29 LAB — MRSA PCR SCREENING: MRSA by PCR: NEGATIVE

## 2016-12-29 LAB — D-DIMER, QUANTITATIVE (NOT AT ARMC): D DIMER QUANT: 2.46 ug{FEU}/mL — AB (ref 0.00–0.50)

## 2016-12-29 MED ORDER — HEPARIN BOLUS VIA INFUSION
4000.0000 [IU] | Freq: Once | INTRAVENOUS | Status: AC
  Administered 2016-12-29: 4000 [IU] via INTRAVENOUS
  Filled 2016-12-29: qty 4000

## 2016-12-29 MED ORDER — ACETAMINOPHEN 650 MG RE SUPP: 650.0000 mg | Freq: Four times a day (QID) | RECTAL | Status: DC | PRN

## 2016-12-29 MED ORDER — IPRATROPIUM-ALBUTEROL 0.5-2.5 (3) MG/3ML IN SOLN: 3.0000 mL | RESPIRATORY_TRACT | Status: DC

## 2016-12-29 MED ORDER — IPRATROPIUM-ALBUTEROL 0.5-2.5 (3) MG/3ML IN SOLN
3.0000 mL | RESPIRATORY_TRACT | Status: AC
  Administered 2016-12-29 (×2): 3 mL via RESPIRATORY_TRACT
  Filled 2016-12-29 (×2): qty 3

## 2016-12-29 MED ORDER — NICOTINE 14 MG/24HR TD PT24
14.0000 mg | MEDICATED_PATCH | Freq: Every day | TRANSDERMAL | Status: DC
  Administered 2016-12-29 – 2017-01-01 (×4): 14 mg via TRANSDERMAL
  Filled 2016-12-29 (×4): qty 1

## 2016-12-29 MED ORDER — ONDANSETRON HCL 4 MG PO TABS: 4.0000 mg | ORAL_TABLET | Freq: Four times a day (QID) | ORAL | Status: DC | PRN

## 2016-12-29 MED ORDER — POTASSIUM CHLORIDE CRYS ER 20 MEQ PO TBCR
40.0000 meq | EXTENDED_RELEASE_TABLET | Freq: Two times a day (BID) | ORAL | Status: DC
  Administered 2016-12-29 – 2016-12-30 (×5): 40 meq via ORAL
  Filled 2016-12-29 (×5): qty 2

## 2016-12-29 MED ORDER — DEXTROSE 5 % IV SOLN
1.0000 g | Freq: Two times a day (BID) | INTRAVENOUS | Status: DC
  Administered 2016-12-29 – 2016-12-30 (×3): 1 g via INTRAVENOUS
  Filled 2016-12-29 (×3): qty 1

## 2016-12-29 MED ORDER — IPRATROPIUM-ALBUTEROL 0.5-2.5 (3) MG/3ML IN SOLN: 3.0000 mL | Freq: Four times a day (QID) | RESPIRATORY_TRACT | Status: DC | PRN

## 2016-12-29 MED ORDER — POLYETHYLENE GLYCOL 3350 17 G PO PACK
17.0000 g | PACK | Freq: Every day | ORAL | Status: DC | PRN
Start: 2016-12-29 — End: 2017-01-01

## 2016-12-29 MED ORDER — HEPARIN (PORCINE) IN NACL 100-0.45 UNIT/ML-% IJ SOLN: 1450.0000 [IU]/h | INTRAMUSCULAR | Status: DC

## 2016-12-29 MED ORDER — SENNA 8.6 MG PO TABS
1.0000 | ORAL_TABLET | Freq: Two times a day (BID) | ORAL | Status: DC
  Administered 2016-12-29 – 2017-01-01 (×6): 8.6 mg via ORAL
  Filled 2016-12-29 (×8): qty 1

## 2016-12-29 MED ORDER — PHENOL 1.4 % MT LIQD
1.0000 | OROMUCOSAL | Status: DC | PRN
  Administered 2016-12-29 – 2016-12-31 (×4): 1 via OROMUCOSAL
  Filled 2016-12-29: qty 177

## 2016-12-29 MED ORDER — ROPINIROLE HCL 1 MG PO TABS
3.0000 mg | ORAL_TABLET | Freq: Three times a day (TID) | ORAL | Status: DC
  Administered 2016-12-29 – 2017-01-01 (×14): 3 mg via ORAL
  Filled 2016-12-29 (×16): qty 3

## 2016-12-29 MED ORDER — ACETAMINOPHEN 325 MG PO TABS
650.0000 mg | ORAL_TABLET | Freq: Four times a day (QID) | ORAL | Status: DC | PRN
  Administered 2016-12-31: 650 mg via ORAL
  Filled 2016-12-29: qty 2

## 2016-12-29 MED ORDER — HEPARIN (PORCINE) IN NACL 100-0.45 UNIT/ML-% IJ SOLN
1600.0000 [IU]/h | INTRAMUSCULAR | Status: DC
  Administered 2016-12-29 – 2016-12-30 (×2): 1450 [IU]/h via INTRAVENOUS
  Filled 2016-12-29: qty 250

## 2016-12-29 MED ORDER — SERTRALINE HCL 100 MG PO TABS
100.0000 mg | ORAL_TABLET | Freq: Every day | ORAL | Status: DC
  Administered 2016-12-29 – 2016-12-31 (×4): 100 mg via ORAL
  Filled 2016-12-29 (×4): qty 1

## 2016-12-29 MED ORDER — HEPARIN (PORCINE) IN NACL 100-0.45 UNIT/ML-% IJ SOLN
1200.0000 [IU]/h | INTRAMUSCULAR | Status: DC
  Administered 2016-12-29: 1200 [IU]/h via INTRAVENOUS
  Filled 2016-12-29: qty 250

## 2016-12-29 MED ORDER — CYCLOBENZAPRINE HCL 10 MG PO TABS
10.0000 mg | ORAL_TABLET | Freq: Three times a day (TID) | ORAL | Status: DC | PRN
  Administered 2016-12-29 – 2017-01-01 (×6): 10 mg via ORAL
  Filled 2016-12-29 (×7): qty 1

## 2016-12-29 MED ORDER — IOPAMIDOL (ISOVUE-370) INJECTION 76%
INTRAVENOUS | Status: AC
  Administered 2016-12-29: 100 mL
  Filled 2016-12-29: qty 100

## 2016-12-29 MED ORDER — HYDROMORPHONE HCL 2 MG PO TABS
4.0000 mg | ORAL_TABLET | Freq: Four times a day (QID) | ORAL | Status: DC | PRN
  Administered 2016-12-29 – 2017-01-01 (×10): 4 mg via ORAL
  Filled 2016-12-29 (×10): qty 2

## 2016-12-29 MED ORDER — ONDANSETRON HCL 4 MG/2ML IJ SOLN: 4.0000 mg | Freq: Four times a day (QID) | INTRAMUSCULAR | Status: DC | PRN

## 2016-12-29 MED ORDER — ENOXAPARIN SODIUM 40 MG/0.4ML ~~LOC~~ SOLN: 40.0000 mg | Freq: Every day | SUBCUTANEOUS | Status: DC

## 2016-12-29 NOTE — Progress Notes (Signed)
ANTICOAGULATION CONSULT NOTE - Initial Consult  Pharmacy Consult for heparin Indication: pulmonary embolus  Allergies  Allergen Reactions  . Hydrocodone-Chlorpheniramine Shortness Of Breath  . Prednisone Shortness Of Breath  . Sulfa Antibiotics Shortness Of Breath  . Chlorhexidine Rash  . Methadone Other (See Comments)    Altered Mental Status  . Benadryl [Diphenhydramine] Anxiety  . Codeine Anxiety  . Diphenhydramine-Zinc Acetate Anxiety  . Latex Rash  . Tape Rash    Patient Measurements: Height: 5\' 1"  (154.9 cm) Weight: 186 lb (84.4 kg) IBW/kg (Calculated) : 47.8 Heparin Dosing Weight: 66.7kg  Vital Signs: Temp: 97.8 F (36.6 C) (06/24 0726) Temp Source: Oral (06/24 0726) BP: 101/67 (06/24 0726) Pulse Rate: 79 (06/24 0726)  Labs:  Recent Labs  12/28/16 1945 12/29/16 0223  HGB 9.6*  --   HCT 29.5*  --   PLT 147*  --   LABPROT 15.2  --   INR 1.19  --   CREATININE 1.06*  --   TROPONINI  --  0.39*    Estimated Creatinine Clearance: 47.3 mL/min (A) (by C-G formula based on SCr of 1.06 mg/dL (H)).   Medical History: Past Medical History:  Diagnosis Date  . Anemia    takes ferrous sulfate daily  . Anxiety   . Arthritis   . Bladder spasm   . Chronic back pain    spinal stenosis  . Colon cancer ([place])   . Complication of anesthesia    B/P bottoms out, hard to wake up   . Contact dermatitis   . COPD (chronic obstructive pulmonary disease) (HCC)    Albuterol inhaler and DuoNeb as needed  . Coronary artery disease    mild by 08/25/09 cath  . Headache   . Heart murmur    never had any problems  . History of blood transfusion    no abnormal reaction noted  . History of bronchitis   . History of MRSA infection 6+ yrs ago  . Hypertension    takes Metoprolol daily  . Insomnia    takes Trazodone nightly  . Joint pain   . Joint swelling   . Nocturia   . Panic attacks    takes Effexor daily  . Peripheral edema    takes Lasix daily and takes Bumex  every 3 days.Takes Aldactone daily   . Restless leg syndrome    takes Requip daily  . RLS (restless legs syndrome)   . Sleep apnea    doesnt use CPAP   Assessment: -D-dimer 2.46 on admission, several small peripheral bilateral PE confirmed on 6/24 CTA with RV/LV ratio 1.04 -Hgb 9.6, Plts 147 at baseline -No signs of bleeding  Goal of Therapy:  Heparin level 0.3-0.7 units/ml Monitor platelets by anticoagulation protocol: Yes   Plan:  -Heparin 4000 units x1, then 1200 units/hr -8-hr HL -Daily CBC/HL -Monitor S/Sx bleeding  Myer Peer Grayland Ormond), PharmD  PGY1 Pharmacy Resident Pager: [phone] 12/29/2016 7:50 AM

## 2016-12-29 NOTE — Progress Notes (Signed)
FPTS Interim Progress Note  S: Patient was noted to have a new bilateral hand tremor by Dr. Andy Gauss on exam this morning. Went to bedside to evaluate patient and see how she was doing. Patient notes that she developed a hand tremor this morning and she has never had one before. Also noted that she has a hard time raising her left arm which is new this morning per her report. She thinks that it may be due to arthritis. Otherwise has no complaints and feels that her shortness of breath has resolved.  O: BP 113/61 (BP Location: Left Arm)   Pulse 79   Temp 97.8 F (36.6 C) (Oral)   Resp 15   Ht 5\' 1"  (1.549 m)   Wt 186 lb (84.4 kg)   SpO2 96%   BMI 35.14 kg/m    Neurologic Exam  Mental Status: alert; oriented to person, place and year; knowledge is normal for age; language is normal Cranial Nerves: extraocular movements are full and conjugate; pupils are round reactive to light; symmetric facial strength; midline tongue and uvula; normal muscle strength of sternocleidomastoids Motor: 5 out of 5 strength in right upper extremity and bilateral lower extremities. 5 out of 5 grip strength in left hand. Unable to assess muscle strength of left shoulder as patient unable to abduct her arm without assistance of her right hand.  Tremor of bilateral hands worsened with intention Sensory: Grossly intact throughout Coordination: poor finger-to-nose with right arm, unable to perform with left arm and she cannot lift it up Gait and Station: Not examined Reflexes: Not examined  A/P:  New bilateral hand tremor and possible left arm weakness: Unclear etiology of the new hand tremor. Left arm weakness possibly due to shoulder pain vs new neurological deficit. Will order head CT and hold heparin for now. Asked neurology for input, appreciate their recommendations.   Carlyle Dolly, MD 12/29/2016, 3:53 PM PGY-2, [facility] pager [phone]

## 2016-12-29 NOTE — Progress Notes (Signed)
ANTICOAGULATION CONSULT NOTE - Follow Up Consult  Pharmacy Consult for Heparin Indication: pulmonary embolus  Allergies  Allergen Reactions  . Hydrocodone-Chlorpheniramine Shortness Of Breath  . Prednisone Shortness Of Breath  . Sulfa Antibiotics Shortness Of Breath  . Chlorhexidine Rash  . Methadone Other (See Comments)    Altered Mental Status  . Benadryl [Diphenhydramine] Anxiety  . Codeine Anxiety  . Diphenhydramine-Zinc Acetate Anxiety  . Latex Rash  . Tape Rash    Patient Measurements: Height: 5\' 1"  (154.9 cm) Weight: 186 lb (84.4 kg) IBW/kg (Calculated) : 47.8 Heparin Dosing Weight: 66.7kg  Vital Signs: Temp: 97.8 F (36.6 C) (06/24 0726) Temp Source: Oral (06/24 0726) BP: 113/61 (06/24 1238) Pulse Rate: 79 (06/24 0726)  Labs:  Recent Labs  12/28/16 1945 12/29/16 0223 12/29/16 0725 12/29/16 1422  HGB 9.6*  --  8.8*  --   HCT 29.5*  --  27.6*  --   PLT 147*  --  141*  --   LABPROT 15.2  --   --   --   INR 1.19  --   --   --   HEPARINUNFRC  --   --   --  0.23*  CREATININE 1.06*  --  0.90  --   TROPONINI  --  0.39* 0.26*  --     Estimated Creatinine Clearance: 55.7 mL/min (by C-G formula based on SCr of 0.9 mg/dL).   Medications:  Heparin @ 1200 units/hr  Assessment: 71yof started on heparin earlier today for new PE. Initial heparin level is below goal at 0.23. No issues with infusion. No bleeding.  Goal of Therapy:  Heparin level 0.3-0.7 units/ml Monitor platelets by anticoagulation protocol: Yes   Plan:  1) Increase heparin to 1450 units/hr 2) Check 8 hour heparin level  Deboraha Sprang 12/29/2016,3:25 PM

## 2016-12-29 NOTE — H&P (Signed)
[facility] Admission History and Physical Service Pager: [phone]  Patient name: Ann Silva Medical record number: [id number] Date of birth: 08-Jul-1946 Age: 71 y.o. Gender: female  Primary Care Provider: Rusty Aus, MD Consultants: None Code Status: Full   Chief Complaint: Shortness of Breath  Assessment and Plan: Ann Silva is a 71 y.o. female with a past medical history significant for COPD, HTN, Chronic Lumbar back pain s/p spinal fusion surgery (on 12/25/2016), depression and lymphedema presenting with shortness of breath.  #Shortness of breath, acute, improving Patient initially presented with shortness of breath, patient was febrile to 101.20F and tachycardic to 112, but respiratory rate was 15 and BP was 149/109. Patient's O2 sat was on admission 88% with increase work of breathing and mild altered mental status. Patient was initially started on CPAP by EMS and was transitioned to [place] 2L but continue to hypoxic with O2 around 86%. Oxygen was increased to 4L and with patient maintaining O2 saturation in the low 90's. On lung exam patient has crackles bilaterally at the lung bases and some wheezes were also noted. CXR show clear sign of volume overload and possible pneumonia. Given fever on admission, increase WOB, hypoxia and recent hospitalization and patient was started on Vancomycin/Cefepime. Patient was also started on IV lasix with clear signs of volume overload. Patient continue to be consistently hypotensive and was bolus with appropriate response. Leading diagnosis is volume overload in the setting of missed diuresis regimen for the past 7-10 days. Patient on lasix and Bumex at home for volume overload but has no formal diagnosis of CHF (Last Echo EF 50%-55% with Grade 1 diastolic dysfunction). In anticipation for her spinal fusion surgery, patient was asked to not take diuretic for a weeks. Exam show mild LE edema bilaterally and crackles on  exam. No BNP was ordered in the ED. Also on our differential, PE given recent surgery and tachycardia on admission, however patient has no calf pain, negative Homan's sign, Well score of 3, and swelling is bilateral with improving tachycardia with smalI IVF bolus. Another diagnosis on our differential would be COPD exacerbation, given wheezing noted on exam and current smoker status, however respiratory status seemed to improve with lasix. Lastly, pneumonia would also be included on our differential given CXR findings, recent hospital stay and fever on presentation. Given hypotension in the setting of volume overload, will hold lasix after dose given in ED to avoid further drop in BP. --Admit to FMTS, admitting physician Dr.Eniola --Admit to Stepdown --Continue vancomycin 750 mg daily --Continue Cefepime 1 g daily --Follow up on am CBC and CMP --Oxygen therapy as needed --Start Duoneb q4 for 2 doses --Follow up on BNP --Follow up on D-Dimers --Follow up on A1c --Acetaminophen 650 mg q6 prn --Zofran 4mg  q4 --Consult PT/OT  #Pneumonia, acute Patient initially presented with shortness of breath in the setting of recent surgery and volume overloaded. However patient is febrile to 101.7 on admission and become hypotensive requiring IV fluid. patient also have increase work of breathing and low O2 saturation. CXR showed bilateral interstitial opacities concerning for possible infection --pateint Started on Vancomycin and cefepime per pharm -- blood culture in process  --Will continue to monitor vitals. --O2 threapy as need --Continue breathing treatment    #Elevated troponin, acute Patient initally presented with clear signs of volume overload, with crackles on exam, LE edema, shortness of breath and received Lasix. Trop was 0.35 likely secondary to demand ischemia in the setting of  volume overload. Patient did not have a BNP ordered, however EKG only showed sinus tachycardia. And patient denies  any chest pain. Patient does not have a formal diagnosis of CHF with last Echo on 08/2015 showing an EF of 50-55% with G1DD. Given troponemia will  Further evaluate cardiac status. Heart score 4 (moderate risk) --Trend troponin x3 --Order echocardiogram  --Follow up on morning EKG  #AKI On admission, Creatinine is 1.06, baseline around 0.8. Likely prerenal in the setting of poor po intake s/p recent surgery and increase sleepiness and fatigue at home with worsening respiratory status. --Will follow up on am CMP  #Lumbar pain s/p spinal fusion surgery Patient with long history lumbar pain with initial operation 20 years ago. Patient had revision done for spinal fusion of her lumbar spine on 6/20 by Dr.Jenkins. Has started PT at home but felt she was worsening from a respiratory standpoint. Pain seem to be well controlled, and dressing looks dry with no sign of bleeding or drainage. --Continue hydromorphone 4 mg  q6 prn for severe pain --Continue Flexeril 10 mg tid PRN --Acetaminophen 650 mg q6 prn for moderate pain --Miralax and Sennokot to avoid contipation  #Hypertension  On initial presentation patient BP was elevated at 149/109. However patient quickly became hypotensive to 96/54 and continue to be hypotensive into the 80's for SBP and 91'Q for diastolic. Patient was alert but complaint of dizziness. Received 500 ml  NS bolus with appropriate response and increase in BP. --Will continue to monitor --Hold metoprolol 50 mg daily, will restart when appropriate  #Restless less syndrome, chronic, stable Well controlled on home regimen --Will continue ropinirole 3 mg tid  #Depression Well controlled on home regimen --Will continue Sertraline 100 mg qhs  #Tobacco abuse Patient currently smoking about 1/4 pack a day. --Start nicotine patch 14 mg   FEN/GI: Heart healthy  Prophylaxis: Lovenox  Disposition: home pending clinical improvement  History of Present Illness:  Ann Silva  is a 71 y.o. female with a past medical history significant for COPD, HTN, Chronic Lumbar back pain s/p spina fusion surgery (on 12/25/2016), depression and lymphedema presenting with shortness of breath. Patient reports that she had lumbar fusion surgery on 6/20 and was discharge home on 6/21. Since her discharge from the hospital, patient reports not "being herself" and has been more tired and sleepy than usual. She started developing shortness of breath on Friday which gradually got worse. This morning patient has as session at home with physical therapy which she said was very difficult and felt like her respiratory status was at its worse. Story was confirmed by husband who also report altered mental status in addition to increase sleepiness. Patient pain was well controlled post surgery. She reports subjective fever as home and weight gain for the past month attributing it to recent steroid.  Patient denies abdominal pain, nausea, vomiting, diarrhea or chest pain.   Prior to admission in the ED, patient was started on CPAP for O2 in the low 80's. Patient was quickly transitioned to 2L [place] that was quickly increase to 4L to maintain saturation in the low to mid 90. CXR was done and showed signs of volume overload with possible pneumonia. Patient was started on Vancomycin and Cefepime and received lasix for fluid management.   Review Of Systems: Per HPI with the following additions: Review of Systems  Constitutional: Positive for fever and malaise/fatigue.  HENT: Negative.   Eyes: Negative.   Respiratory: Negative.   Cardiovascular: Negative for chest  pain.  Gastrointestinal: Negative.   Genitourinary: Negative.   Musculoskeletal: Negative.   Skin: Negative.   Neurological: Negative.   Endo/Heme/Allergies: Negative.   Psychiatric/Behavioral: Negative.     Patient Active Problem List   Diagnosis Date Noted  . Shortness of breath 12/29/2016  . Lumbar adjacent segment disease with  spondylolisthesis 12/25/2016  . COPD with acute exacerbation ([place]) 11/27/2015  . COPD exacerbation ([place]) 08/21/2015  . Restless legs 02/27/2015  . Hypertension   . Colon cancer ([place])   . Depression   . History of repair of hip joint 10/25/2013    Past Medical History: Past Medical History:  Diagnosis Date  . Anemia    takes ferrous sulfate daily  . Anxiety   . Arthritis   . Bladder spasm   . Chronic back pain    spinal stenosis  . Colon cancer ([place])   . Complication of anesthesia    B/P bottoms out, hard to wake up   . Contact dermatitis   . COPD (chronic obstructive pulmonary disease) ([facility])    Albuterol inhaler and DuoNeb as needed  . Coronary artery disease    mild by 08/25/09 cath  . Headache   . Heart murmur    never had any problems  . History of blood transfusion    no abnormal reaction noted  . History of bronchitis   . History of MRSA infection 6+ yrs ago  . Hypertension    takes Metoprolol daily  . Insomnia    takes Trazodone nightly  . Joint pain   . Joint swelling   . Nocturia   . Panic attacks    takes Effexor daily  . Peripheral edema    takes Lasix daily and takes Bumex every 3 days.Takes Aldactone daily   . Restless leg syndrome    takes Requip daily  . RLS (restless legs syndrome)   . Sleep apnea    doesnt use CPAP    Past Surgical History: Past Surgical History:  Procedure Laterality Date  . ABDOMINAL HYSTERECTOMY    . CHOLECYSTECTOMY    . COLON SURGERY     partial colectomy d/t cancer cells  . COLONOSCOPY    . I&D of left hip      x 4  . JOINT REPLACEMENT    . Resection total hip    . Revision of total hip arthroplasty     x3-4  . right hip replacement    . TOTAL HIP ARTHROPLASTY Left     Social History: Social History  Substance Use Topics  . Smoking status: Current Every Day Smoker    Packs/day: 0.75    Years: 51.00    Types: Cigarettes    Start date: 07/08/1964  . Smokeless tobacco: Never Used  . Alcohol use No    Additional social history:   Please also refer to relevant sections of EMR.  Family History: Family History  Problem Relation Age of Onset  . Cancer Mother   . Stroke Father   . Diabetes Sister   . Mental illness Sister     Allergies and Medications: Allergies  Allergen Reactions  . Hydrocodone-Chlorpheniramine Shortness Of Breath  . Prednisone Shortness Of Breath  . Sulfa Antibiotics Shortness Of Breath  . Chlorhexidine Rash  . Methadone Other (See Comments)    Altered Mental Status  . Benadryl [Diphenhydramine] Anxiety  . Codeine Anxiety  . Diphenhydramine-Zinc Acetate Anxiety  . Latex Rash  . Tape Rash   No current facility-administered medications on file  prior to encounter.    Current Outpatient Prescriptions on File Prior to Encounter  Medication Sig Dispense Refill  . acetaminophen (TYLENOL) 500 MG tablet Take 1,000 mg by mouth every 6 (six) hours as needed (for pain/back pain.).    Marland Kitchen albuterol (PROVENTIL HFA;VENTOLIN HFA) 108 (90 Base) MCG/ACT inhaler Inhale 2 puffs into the lungs every 4 (four) hours as needed for wheezing or shortness of breath. 1 Inhaler 0  . budesonide-formoterol (SYMBICORT) 160-4.5 MCG/ACT inhaler Inhale 2 puffs into the lungs 2 (two) times daily. (Patient taking differently: Inhale 2 puffs into the lungs 2 (two) times daily as needed (for respiratory issues). ) 1 Inhaler 0  . furosemide (LASIX) 20 MG tablet Take 1 tablet (20 mg total) by mouth daily. (Patient taking differently: Take 20 mg by mouth 2 (two) times daily. ) 15 tablet 0  . HYDROmorphone (DILAUDID) 4 MG tablet Take 1 tablet (4 mg total) by mouth every 6 (six) hours as needed for severe pain. 20 tablet 0  . ipratropium-albuterol (DUONEB) 0.5-2.5 (3) MG/3ML SOLN Take 3 mLs by nebulization every 6 (six) hours as needed (for wheezing/shortness of breath).    . bumetanide (BUMEX) 2 MG tablet Take 2 mg by mouth every 3 (three) days.    . Calcium Carbonate-Vit D-Min (CALTRATE 600+D PLUS  PO) Take 1 tablet by mouth 2 (two) times daily.    . Cyanocobalamin (VITAMIN B-12) 2500 MCG SUBL Place 2,500 mcg under the tongue at bedtime.    . cyclobenzaprine (FLEXERIL) 10 MG tablet Take 1 tablet (10 mg total) by mouth 3 (three) times daily as needed for muscle spasms. 50 tablet 1  . docusate sodium (COLACE) 100 MG capsule Take 1 capsule (100 mg total) by mouth 2 (two) times daily. 60 capsule 0  . Emollient (CERAVE) CREA Apply 1 application topically 2 (two) times daily. Applied neck down for dermatitis    . fluticasone (FLONASE) 50 MCG/ACT nasal spray Place 1 spray into both nostrils daily as needed (for sinus issues).     . Iron-Vitamin C (VITRON-C PO) Take 1 tablet by mouth at bedtime.    . magic mouthwash SOLN Take 5 mLs by mouth 3 (three) times daily as needed for mouth pain. 15 mL 0  . metoprolol succinate (TOPROL-XL) 50 MG 24 hr tablet Take 1 tablet (50 mg total) by mouth every evening. Take with or immediately following a meal. (Patient taking differently: Take 50 mg by mouth at bedtime. Take with or immediately following a meal.) 30 tablet 1  . mometasone (ELOCON) 0.1 % cream Apply 1 application topically 2 (two) times daily. For dermatitis    . Multiple Vitamins-Minerals (MULTIVITAMIN GUMMIES WOMENS PO) Take 2 tablets by mouth at bedtime.    . mupirocin ointment (BACTROBAN) 2 % Apply 1 application topically 2 (two) times daily as needed. For skin infection.    . ondansetron (ZOFRAN ODT) 4 MG disintegrating tablet Take 1 tablet (4 mg total) by mouth every 8 (eight) hours as needed for nausea or vomiting. 30 tablet 0  . rOPINIRole (REQUIP) 3 MG tablet Take 1 tablet (3 mg total) by mouth 4 (four) times daily. 360 tablet 4  . sertraline (ZOLOFT) 100 MG tablet Take 100 mg by mouth at bedtime.     Marland Kitchen spironolactone (ALDACTONE) 25 MG tablet Take 25 mg by mouth at bedtime.       Objective: BP 96/63   Pulse 78   Temp 99.2 F (37.3 C) (Oral)   Resp 17   Ht  5\' 1"  (1.549 m)   Wt 185 lb  (83.9 kg)   SpO2 95%   BMI 34.96 kg/m   Physical Exam  General: Tired appearing woman, NAD, pleasant, able to participate in exam Cardiac: RRR, normal heart sounds, no murmurs. 2+ radial and PT pulses bilaterally, no JVD Respiratory: Normal effort, bilateral crackles upper and lower lung field  Abdomen: soft, nontender, nondistended, no hepatic or splenomegaly, +BS Extremities: Bilateral lower extremities edema, negative Homan sign's no cord palpated Skin: warm and dry, no rashes noted Neuro: alert and oriented x4, no focal deficits Psych: Normal affect and mood  Labs and Imaging: CBC BMET   Recent Labs Lab 12/28/16 1945  WBC 9.0  HGB 9.6*  HCT 29.5*  PLT 147*    Recent Labs Lab 12/28/16 1945  NA 134*  K 3.4*  CL 101  CO2 25  BUN 21*  CREATININE 1.06*  GLUCOSE 160*  CALCIUM 9.5    EKG: sinus tachycardia. Incomplete right bundle branch block (old) Lactic acid: 1.45  Troponin (Point of Care Test)  Recent Labs  12/28/16 2002  TROPIPOC 0.35*   Dg Chest Portable 1 View  IMPRESSION: 1. The heart appears larger in the interval but this could be at least partially due to the portable technique. However, there is increased interstitial opacity bilaterally which could represent edema or atypical infection.     Marjie Skiff, MD 12/29/2016, 12:17 AM PGY-1, [place] [facility] pager: [phone], text pages welcome  UPPER LEVEL ADDENDUM  I have read the above note and made revisions highlighted in blue.  Smiley Houseman, MD PGY-2 [facility] Pager [phone]

## 2016-12-29 NOTE — Progress Notes (Signed)
Family Medicine Teaching Service Daily Progress Note Intern Pager: [phone]  Patient name: Ann Silva Medical record number: [id number] Date of birth: 1945-09-11 Age: 71 y.o. Gender: female  Primary Care Provider: Rusty Aus, MD Consultants: None  Code Status: Full  Pt Overview and Major Events to Date:  Admitted on 6/23 to stepdown for SOB 6/24: CTA showed several small emboli and at least one submassive PE  Assessment and Plan: Ann Silva is a 71 y.o. female with a past medical history significant for COPD, HTN, Chronic Lumbar back pain s/p spinal fusion surgery (on 12/25/2016), depression and lymphedema presenting with shortness of breath.  #Shortness of breath, acute, improving BNP 396.1 which is elevated, confirming element of volume overload as one of the contributing factor in patient dyspnea. Patient also found to had one large PE with multiple small infarcts which would also cause exacerbation of her dyspnea. Lastly PNA is also involve in patient's shortness of breath. --Continue Vancomycin 750 mg daily --Continue Cefepime 1 g daily --Oxygen therapy as needed --Start Duoneb q4 for 2 doses --Follow up on A1c --Acetaminophen 650 mg q6 prn --Zofran 4mg  q4 --Consult PT/OT  #PE, acute  Patient initial presentation concerning for PE with recent surgery. D Dimers was elevated which lead to CTA which showed several small peripheral bilateral pulmonary emboli. Positive for acute PE with CT evidence of right heart strain consistent with at least submassive (intermediate risk) PE. --Started patient on Heparin drip  --Will continue to monitor   #Pneumonia, acute Patient initially presented with shortness of breath in the setting of recent surgery and volume overloaded. However patient is febrile to 101.7 on admission and become hypotensive requiring IV fluid. patient also have increase work of breathing and low O2 saturation. CXR showed bilateral interstitial opacities  concerning for possible infection. WBCs has trended down from 9.0 to 7.1 --Continue Vancomycin and cefepime  --Follow up on Blood cultures  --O2 threapy as need --Continue breathing treatment    #Elevated troponin, acute, improving Down trending 0.35>0390.26. Likely secondary to demand in the setting of volume overload. Last ECHO win normal EF, last EKG was NSR --Trend troponin x3 --Order echocardiogram  --Follow up on morning EKG  #AKI On admission, Creatinine is 1.06, baseline around 0.8. Likely prerenal in the setting of poor po intake s/p recent surgery and increase sleepiness and fatigue at home with worsening respiratory status. This morning AKI is 0.90. --Will follow up on am CMP  #Lumbar pain s/p spinal fusion surgery Patient with long history lumbar pain with initial operation 20 years ago. Patient had revision done for spinal fusion of her lumbar spine on 6/20 by Dr.Jenkins. Has started PT at home but felt she was worsening from a respiratory standpoint. Pain seem to be well controlled, and dressing looks dry with no sign of bleeding or drainage. --Continue hydromorphone 4 mg  q6 prn for severe pain --Continue Flexeril 10 mg tid PRN --Acetaminophen 650 mg q6 prn for moderate pain --Miralax and Sennokot to avoid contipation  #Hypertension  On initial presentation patient BP was elevated at 149/109. However patient quickly became hypotensive to 96/54 and continue to be hypotensive into the 80's for SBP and 95'J for diastolic. Patient was alert but complaint of dizziness. Received 500 ml  NS bolus with appropriate response and increase in BP. --Will continue to monitor --Hold metoprolol 50 mg daily, will restart when appropriate  #Restless less syndrome, chronic, stable Well controlled on home regimen --Will continue ropinirole 3 mg tid  #  Depression Well controlled on home regimen --Will continue Sertraline 100 mg qhs  #Tobacco abuse Patient currently smoking about  1/4 pack a day. --Start nicotine patch 14 mg   FEN/GI: Heart Healthy PPx: Lovenox  Disposition: Home pending clinical improvement  Subjective:  Patient is feeling better this morning, still a bit tired but breathing has improved. Discussed new PE diagnosis with family at bedside. Patient is complaining of bilateral hand tremor.  Objective: Temp:  [97.1 F (36.2 C)-101.7 F (38.7 C)] 97.8 F (36.6 C) (06/24 0726) Pulse Rate:  [69-112] 79 (06/24 0726) Resp:  [11-22] 15 (06/24 0726) BP: (82-149)/(36-109) 101/67 (06/24 0726) SpO2:  [86 %-97 %] 96 % (06/24 0822) Weight:  [185 lb (83.9 kg)-186 lb 9.6 oz (84.6 kg)] 186 lb (84.4 kg) (06/24 0350) Physical Exam: General: Tired appearing woman, NAD, pleasant, able to participate in exam Cardiac: RRR, normal heart sounds, no murmurs. 2+ radial and PT pulses bilaterally, no JVD Respiratory: Normal effort, bilateral crackles upper and lower lung field  Abdomen: soft, nontender, nondistended, no hepatic or splenomegaly, +BS Extremities: Bilateral lower extremities edema, negative Homan sign's no cord palpated, bilateral hand tremors. Skin: warm and dry, no rashes noted Neuro: alert and oriented x4, no focal deficits Psych: Normal affect and mood   Laboratory:  Recent Labs Lab 12/26/16 0402 12/28/16 1945 12/29/16 0725  WBC 11.5* 9.0 7.1  HGB 10.6* 9.6* 8.8*  HCT 32.9* 29.5* 27.6*  PLT 163 147* 141*    Recent Labs Lab 12/26/16 0402 12/28/16 1945 12/29/16 0725  NA 138 134* 133*  K 3.9 3.4* 3.6  CL 106 101 102  CO2 25 25 24   BUN 19 21* 23*  CREATININE 0.87 1.06* 0.90  CALCIUM 9.3 9.5 9.1  PROT  --  6.2* 5.7*  BILITOT  --  0.9 0.7  ALKPHOS  --  83 72  ALT  --  14 14  AST  --  29 22  GLUCOSE 134* 160* 146*   Trop 0.35>0.39>0.26 BNP 396.1  Imaging/Diagnostic Tests: Ct Angio Chest Pe W Or Wo Contrast  Addendum Date: 12/29/2016   ADDENDUM REPORT: 12/29/2016 07:57 ADDENDUM: Critical Value/emergent results were called by  telephone at the time of interpretation on 12/29/2016 at 7:33 am to Dr. Andrena Mews , who verbally acknowledged these results. Electronically Signed   By: Marin Olp M.D.   On: 12/29/2016 07:57   Result Date: 12/29/2016 CLINICAL DATA:  Spinal fusion 12/25/2016 with recent discharge. Now shortness-of-breath 2 days with fever, leg swelling and elevated D-dimer. EXAM: CT ANGIOGRAPHY CHEST WITH CONTRAST TECHNIQUE: Multidetector CT imaging of the chest was performed using the standard protocol during bolus administration of intravenous contrast. Multiplanar CT image reconstructions and MIPs were obtained to evaluate the vascular anatomy. CONTRAST:  100 mL Isovue 370 IV. COMPARISON:  Chest CT 12/25/2009 and chest x-ray 08/13/2016 and 12/28/2016. FINDINGS: Cardiovascular: Mild cardiomegaly. Subtle calcified plaque over the left anterior descending coronary artery. Mild calcified plaque over the thoracic aorta. Mild dilatation of the ascending thoracic aorta measuring 3.8 cm in AP diameter. Pulmonary arterial system is well opacified and demonstrates several small peripheral bilateral emboli. RV/LV ratio is 41.8/ 40.1= 1.04. Mediastinum/Nodes: No significant mediastinal or hilar adenopathy. Remaining mediastinal structures are unremarkable. Lungs/Pleura: Lungs are adequately inflated demonstrate tiny amount of bilateral pleural fluid and posterior bibasilar dependent atelectasis. Mild emphysematous disease over the upper lung/apices. Patchy airspace opacification over the mid to upper lungs which may be due to infection. Airways are normal. Upper Abdomen: Previous cholecystectomy. Calcified  plaque over the abdominal aorta. 2.2 cm left renal cyst. Exophytic subcentimeter hypodensity over the upper pole right kidney likely a cyst. 1 cm cyst over the mid to upper pole right kidney. Musculoskeletal: Mild degenerate change of the spine. Review of the MIP images confirms the above findings. IMPRESSION: Several small  peripheral bilateral pulmonary emboli. Positive for acute PE with CT evidence of right heart strain (RV/LV Ratio = 1.04) consistent with at least submassive (intermediate risk) PE. The presence of right heart strain has been associated with an increased risk of morbidity and mortality. Please activate Code PE by paging [phone]. Mild patchy airspace process over the mid to upper lungs. This may be due to infection. Tiny amount of bilateral pleural fluid and bibasilar dependent atelectasis. Mild emphysematous disease. Mild cardiomegaly. Mild dilatation of the ascending thoracic aorta measuring 3.8 cm. Recommend annual imaging followup by CTA or MRA. This recommendation follows 2010 ACCF/AHA/AATS/ACR/ASA/SCA/SCAI/SIR/STS/SVM Guidelines for the Diagnosis and Management of Patients with Thoracic Aortic Disease. Circulation.2010; 121: J884-Z660. Bilateral renal cysts. Aortic Atherosclerosis (ICD10-I70.0) and Emphysema (ICD10-J43.9). Electronically Signed: By: Marin Olp M.D. On: 12/29/2016 07:27   Dg Chest Portable 1 View  Result Date: 12/28/2016 CLINICAL DATA:  Shortness of breath. EXAM: PORTABLE CHEST 1 VIEW COMPARISON:  Chest x-rays from April 29, 2016 and August 13, 2016 FINDINGS: Evaluation of cardiac size is limited due to portable technique but the heart appears larger in the interval. The hila are symmetric. The mediastinum is unremarkable. No pneumothorax. Increased interstitial markings in the interval. No focal infiltrate. IMPRESSION: 1. The heart appears larger in the interval but this could be at least partially due to the portable technique. However, there is increased interstitial opacity bilaterally which could represent edema or atypical infection. Recommend a PA and lateral chest x-ray for better evaluation. Electronically Signed   By: Dorise Bullion III M.D   On: 12/28/2016 20:00    Marjie Skiff, MD 12/29/2016, 8:55 AM PGY-1, [place] Intern pager:  [phone], text pages welcome

## 2016-12-29 NOTE — Consult Note (Addendum)
Neurology Consultation Reason for Consult: Tremor Referring Physician: Juanito Doom, C  CC: Tremor  History is obtained from: Patient  HPI: Ann Silva is a 71 y.o. female who has been admitted with PE is being  Treated for this. She is also noticed that she has bilateral hand tremor which started today. She denies any history of tremor even with anxiety. She also has been having a difficult time lifting her left arm due to pain, stating that it does not feel weak just feels like she can't move because it is hurting. She denies any vertigo, nausea or vomiting.  She is on Requip, apparently for restless leg syndrome.  ROS: A 14 point ROS was performed and is negative except as noted in the HPI.   Past Medical History:  Diagnosis Date  . Anemia    takes ferrous sulfate daily  . Anxiety   . Arthritis   . Bladder spasm   . Chronic back pain    spinal stenosis  . Colon cancer ([place])   . Complication of anesthesia    B/P bottoms out, hard to wake up   . Contact dermatitis   . COPD (chronic obstructive pulmonary disease) (H[facility])    Albuterol inhaler and DuoNeb as needed  . Coronary artery disease    mild by 08/25/09 cath  . Headache   . Heart murmur    never had any problems  . History of blood transfusion    no abnormal reaction noted  . History of bronchitis   . History of MRSA infection 6+ yrs ago  . Hypertension    takes Metoprolol daily  . Insomnia    takes Trazodone nightly  . Joint pain   . Joint swelling   . Nocturia   . Panic attacks    takes Effexor daily  . Peripheral edema    takes Lasix daily and takes Bumex every 3 days.Takes Aldactone daily   . Restless leg syndrome    takes Requip daily  . RLS (restless legs syndrome)   . Sleep apnea    doesnt use CPAP     Family History  Problem Relation Age of Onset  . Cancer Mother   . Stroke Father   . Diabetes Sister   . Mental illness Sister      Social History:  reports that she has been smoking  Cigarettes.  She started smoking about 52 years ago. She has a 38.25 pack-year smoking history. She has never used smokeless tobacco. She reports that she does not drink alcohol or use drugs.   Exam: Current vital signs: BP 113/61 (BP Location: Left Arm)   Pulse 79   Temp 97.8 F (36.6 C) (Oral)   Resp 15   Ht 5\' 1"  (1.549 m)   Wt 84.4 kg (186 lb)   SpO2 96%   BMI 35.14 kg/m  Vital signs in last 24 hours: Temp:  [97.1 F (36.2 C)-101.7 F (38.7 C)] 97.8 F (36.6 C) (06/24 0726) Pulse Rate:  [69-112] 79 (06/24 0726) Resp:  [11-22] 15 (06/24 0726) BP: (82-149)/(36-109) 113/61 (06/24 1238) SpO2:  [86 %-97 %] 96 % (06/24 0822) Weight:  [83.9 kg (185 lb)-84.6 kg (186 lb 9.6 oz)] 84.4 kg (186 lb) (06/24 0350)   Physical Exam  Constitutional: Appears well-developed and well-nourished.  Psych: Affect appropriate to situation Eyes: No scleral injection HENT: No OP obstrucion Head: Normocephalic.  Cardiovascular: Normal rate and regular rhythm.  Respiratory: Effort normal and breath sounds normal to anterior ascultation GI:  Soft.  No distension. There is no tenderness.  Skin: WDI  Neuro: Mental Status: Patient is awake, alert, oriented to person, place, month, year, and situation. Patient is able to give a clear and coherent history. No signs of aphasia or neglect Cranial Nerves: II: Visual Fields are full. Pupils are equal, round, and reactive to light.   III,IV, VI: EOMI without ptosis or diploplia.  V: Facial sensation is symmetric to temperature VII: Facial movement is symmetric.  VIII: hearing is intact to voice X: Uvula elevates symmetrically XI: Shoulder shrug is symmetric. XII: tongue is midline without atrophy or fasciculations.  Motor: Tone is normal. Bulk is normal. 5/5 strength was present in bilateral lower extremities and right arm, proximally she is limited due to pain in her left arm but no clear focal weakness and she is able to hold it without  drift. Sensory: Sensation is symmetric to light touch and temperature in the arms and legs. Deep Tendon Reflexes: 2+ and symmetric in the biceps and patellae.  Cerebellar: She has mild postural and intentional tremor bilaterally   I have reviewed labs in epic and the results pertinent to this consultation are: Elevated BNP Mild hyponatremia Elevated d-dimer  Impression: 71 year old female admitted with PE with new onset tremor. This has the appearance of enhanced physiologic tremor, but is quite prominent. Without any other findings, and with bilateral symptoms I think that ischemic stroke is relatively unlikely, but given the anticoagulation and do think a CT head is reasonable. If it continues to be a problem then an MRI could be reasonable, though I think relatively low yield. I would expect this to improve as her medical issues improved.  Recommendations: 1) CT head 2) if continues to be a problem could consider MRI brain   Roland Rack, MD [facility] [phone]  If 7pm- 7am, please page neurology on call as listed in [place].

## 2016-12-30 ENCOUNTER — Inpatient Hospital Stay (HOSPITAL_COMMUNITY): Payer: Medicare Other

## 2016-12-30 DIAGNOSIS — R0600 Dyspnea, unspecified: Secondary | ICD-10-CM

## 2016-12-30 LAB — CBC
HEMATOCRIT: 26.7 % — AB (ref 36.0–46.0)
HEMOGLOBIN: 8.6 g/dL — AB (ref 12.0–15.0)
MCH: 30.8 pg (ref 26.0–34.0)
MCHC: 32.2 g/dL (ref 30.0–36.0)
MCV: 95.7 fL (ref 78.0–100.0)
Platelets: 158 10*3/uL (ref 150–400)
RBC: 2.79 MIL/uL — ABNORMAL LOW (ref 3.87–5.11)
RDW: 14.5 % (ref 11.5–15.5)
WBC: 5.9 10*3/uL (ref 4.0–10.5)

## 2016-12-30 LAB — ECHOCARDIOGRAM COMPLETE
Height: 61 in
WEIGHTICAEL: 3011.2 [oz_av]

## 2016-12-30 LAB — BASIC METABOLIC PANEL
Anion gap: 6 (ref 5–15)
BUN: 15 mg/dL (ref 6–20)
CHLORIDE: 103 mmol/L (ref 101–111)
CO2: 25 mmol/L (ref 22–32)
Calcium: 9.2 mg/dL (ref 8.9–10.3)
Creatinine, Ser: 0.68 mg/dL (ref 0.44–1.00)
GFR calc Af Amer: >60 mL/min (ref >60)
GFR calc non Af Amer: >60 mL/min (ref >60)
Glucose, Bld: 116 mg/dL — ABNORMAL HIGH (ref 65–99)
POTASSIUM: 4.2 mmol/L (ref 3.5–5.1)
SODIUM: 134 mmol/L — AB (ref 135–145)

## 2016-12-30 LAB — HEMOGLOBIN A1C
Hgb A1c MFr Bld: 5.2 % (ref 4.8–5.6)
Mean Plasma Glucose: 103 mg/dL

## 2016-12-30 LAB — HEPARIN LEVEL (UNFRACTIONATED): Heparin Unfractionated: 0.18 IU/mL — ABNORMAL LOW (ref 0.30–0.70)

## 2016-12-30 MED ORDER — APIXABAN 5 MG PO TABS: 5.0000 mg | ORAL_TABLET | Freq: Two times a day (BID) | ORAL | Status: DC

## 2016-12-30 MED ORDER — LEVOFLOXACIN 750 MG PO TABS: 750.0000 mg | ORAL_TABLET | Freq: Every day | ORAL | Status: DC

## 2016-12-30 MED ORDER — ORAL CARE MOUTH RINSE
15.0000 mL | Freq: Two times a day (BID) | OROMUCOSAL | Status: DC
  Administered 2016-12-30 – 2017-01-01 (×4): 15 mL via OROMUCOSAL

## 2016-12-30 MED ORDER — HEPARIN BOLUS VIA INFUSION
2000.0000 [IU] | Freq: Once | INTRAVENOUS | Status: AC
  Administered 2016-12-30: 2000 [IU] via INTRAVENOUS
  Filled 2016-12-30: qty 2000

## 2016-12-30 MED ORDER — APIXABAN 5 MG PO TABS
10.0000 mg | ORAL_TABLET | Freq: Two times a day (BID) | ORAL | Status: DC
  Administered 2016-12-30 – 2017-01-01 (×5): 10 mg via ORAL
  Filled 2016-12-30 (×5): qty 2

## 2016-12-30 MED ORDER — CEPHALEXIN 500 MG PO CAPS
500.0000 mg | ORAL_CAPSULE | Freq: Four times a day (QID) | ORAL | Status: DC
  Administered 2016-12-30 – 2017-01-01 (×9): 500 mg via ORAL
  Filled 2016-12-30 (×9): qty 1

## 2016-12-30 NOTE — Evaluation (Signed)
Physical Therapy Evaluation Patient Details Name: Ann Silva MRN: [id number] DOB: Nov 07, 1945 Today's Date: 12/30/2016   History of Present Illness  Pt adm with PE and DVT. Pt with L4-5 laminectomy and fusion on 12/25/16. PMH - CAD, COPD, HTN, THR  Clinical Impression  Pt admitted with above diagnosis and presents to PT with functional limitations due to deficits listed below (See PT problem list). Pt needs skilled PT to maximize independence and safety to allow discharge to ST-SNF prior to return home. Pt weaker and requiring more assist than she did immediately after her back surgery. Pt reports her husband unable to provide physical assist at home due to his own medical problems. Pt currently requiring supplemental O2 with SpO2 89% with amb on 2L of O2. At rest on RA pt with SpO2 dropping to 86%.     Follow Up Recommendations SNF    Equipment Recommendations  None recommended by PT    Recommendations for Other Services       Precautions / Restrictions Precautions Precautions: Fall;Back Required Braces or Orthoses: Spinal Brace Spinal Brace: Applied in sitting position Restrictions Weight Bearing Restrictions: No      Mobility  Bed Mobility Overal bed mobility: Needs Assistance Bed Mobility: Rolling;Sidelying to Sit Rolling: Min assist Sidelying to sit: Min assist       General bed mobility comments: Assist to bring shoulders over and to elevate trunk into sitting and to bring hips to EOB  Transfers Overall transfer level: Needs assistance Equipment used: Rolling walker (2 wheeled) Transfers: Sit to/from Stand Sit to Stand: Min assist         General transfer comment: Assist to bring hips up and for balance  Ambulation/Gait Ambulation/Gait assistance: Min assist Ambulation Distance (Feet): 125 Feet Assistive device: Rolling walker (2 wheeled) Gait Pattern/deviations: Step-through pattern;Decreased step length - right;Decreased step length - left;Decreased  stride length;Drifts right/left Gait velocity: decreased Gait velocity interpretation: Below normal speed for age/gender General Gait Details: Unsteady gait requiring assist for balance.  Stairs            Wheelchair Mobility    Modified Rankin (Stroke Patients Only)       Balance Overall balance assessment: Needs assistance Sitting-balance support: Bilateral upper extremity supported;Feet supported Sitting balance-Leahy Scale: Poor Sitting balance - Comments: UE support   Standing balance support: Bilateral upper extremity supported Standing balance-Leahy Scale: Poor Standing balance comment: walker and min guard for static standing                             Pertinent Vitals/Pain Pain Assessment: Faces Faces Pain Scale: Hurts little more Pain Location: back Pain Descriptors / Indicators: Grimacing;Sore Pain Intervention(s): Limited activity within patient's tolerance;Monitored during session    [facility] expects to be discharged to:: Skilled nursing facility Living Arrangements: Spouse/significant other Available Help at Discharge: Family;Available 24 hours/day (husband can't physically assist) Type of Home: House Home Access: Stairs to enter Entrance Stairs-Rails: Right Entrance Stairs-Number of Steps: 1 Home Layout: One level Home Equipment: Walker - 2 wheels;Shower seat;Grab bars - toilet;Grab bars - tub/shower;Hand held shower head;Electric scooter;Wheelchair - power      Prior Function Level of Independence: Independent with assistive device(s)         Comments: Since back surgery last week has been modified independent with 3 wheeled walker but declined with PE     Hand Dominance        Extremity/Trunk Assessment  Upper Extremity Assessment Upper Extremity Assessment: Defer to OT evaluation    Lower Extremity Assessment Lower Extremity Assessment: Generalized weakness    Cervical / Trunk  Assessment Cervical / Trunk Assessment: Other exceptions Cervical / Trunk Exceptions: L4-5 fusion last week  Communication   Communication: No difficulties  Cognition Arousal/Alertness: Awake/alert Behavior During Therapy: WFL for tasks assessed/performed Overall Cognitive Status: Within Functional Limits for tasks assessed                                        General Comments      Exercises     Assessment/Plan    PT Assessment Patient needs continued PT services  PT Problem List         PT Treatment Interventions DME instruction;Gait training;Functional mobility training;Therapeutic activities;Therapeutic exercise;Balance training;Patient/family education    PT Goals (Current goals can be found in the Care Plan section)  Acute Rehab PT Goals Patient Stated Goal: get stronger prior to return home PT Goal Formulation: With patient Time For Goal Achievement: 01/06/17 Potential to Achieve Goals: Good    Frequency Min 3X/week   Barriers to discharge Decreased caregiver support;Inaccessible home environment husband unable to physically assist. Stairs to enter home    Co-evaluation               AM-PAC PT "6 Clicks" Daily Activity  Outcome Measure Difficulty turning over in bed (including adjusting bedclothes, sheets and blankets)?: Total Difficulty moving from lying on back to sitting on the side of the bed? : Total Difficulty sitting down on and standing up from a chair with arms (e.g., wheelchair, bedside commode, etc,.)?: Total Help needed moving to and from a bed to chair (including a wheelchair)?: A Little Help needed walking in hospital room?: A Little Help needed climbing 3-5 steps with a railing? : A Lot 6 Click Score: 11    End of Session Equipment Utilized During Treatment: Gait belt;Back brace Activity Tolerance: Patient limited by fatigue Patient left: in chair;with call bell/phone within reach;with family/visitor present Nurse  Communication: Mobility status PT Visit Diagnosis: Unsteadiness on feet (R26.81);Muscle weakness (generalized) (M62.81);Difficulty in walking, not elsewhere classified (R26.2)    Time: 0177-9390 PT Time Calculation (min) (ACUTE ONLY): 25 min   Charges:   PT Evaluation $PT Eval Moderate Complexity: 1 Procedure PT Treatments $Gait Training: 8-22 mins   PT G Codes:        [facility] PT [place] 12/30/2016, 5:12 PM

## 2016-12-30 NOTE — Progress Notes (Signed)
Patient ID: Ann Silva, female   DOB: June 14, 1946, 71 y.o.   MRN: [id number] Subjective:  The patient is a 71 year old white female well known to me. I performed an extension of her lumbar fusion/ instrumentation last week. The surgery went well and the patient's postoperative course was unremarkable. She was discharged home on postoperative day #1. The patient did well for several days here in physical therapy was started. She began feeling short of breath and poorly. She came to the ER and was diagnosed with pulmonary embolisms and deep venous thrombosis. She was admitted and started on eliquis. The patient has had a small amount of drainage from her wound. Otherwise she tells me her back is doing well and she was pleased with her progress until she developed the pulmonary embolisms.  Objective: Vital signs in last 24 hours: Temp:  [98.1 F (36.7 C)-98.7 F (37.1 C)] 98.4 F (36.9 C) (06/25 1158) Pulse Rate:  [66-73] 67 (06/25 0400) Resp:  [16-22] 19 (06/25 0400) BP: (100-119)/(59-71) 106/64 (06/25 1158) SpO2:  [97 %-98 %] 97 % (06/25 0400) Weight:  [85.4 kg (188 lb 3.2 oz)] 85.4 kg (188 lb 3.2 oz) (06/25 0230)  Intake/Output from previous day: 06/24 0701 - 06/25 0700 In: 1106.3 [P.O.:380; I.V.:176.3; IV Piggyback:550] Out: 600 [Urine:600] Intake/Output this shift: No intake/output data recorded.  Physical exam the patient is alert and pleasant. She is moving her lower extremities well.  The patient's wound appears to be healing well. There is a small amount of old bloody drainage on her dressing. The wound is not erythrematous. I don't see any obvious purulent discharge.  Lab Results:  Recent Labs  12/29/16 0725 12/30/16 0137  WBC 7.1 5.9  HGB 8.8* 8.6*  HCT 27.6* 26.7*  PLT 141* 158   BMET  Recent Labs  12/29/16 0725 12/30/16 0137  NA 133* 134*  K 3.6 4.2  CL 102 103  CO2 24 25  GLUCOSE 146* 116*  BUN 23* 15  CREATININE 0.90 0.68  CALCIUM 9.1 9.2     Studies/Results: Ct Head Wo Contrast  Result Date: 12/29/2016 CLINICAL DATA:  Tremors EXAM: CT HEAD WITHOUT CONTRAST TECHNIQUE: Contiguous axial images were obtained from the base of the skull through the vertex without intravenous contrast. COMPARISON:  08/13/2016 FINDINGS: Brain: Mild atrophic changes are noted. No findings to suggest acute hemorrhage, acute infarction or space-occupying mass lesion are noted. Vascular: No hyperdense vessel or unexpected calcification. Skull: Normal. Negative for fracture or focal lesion. Sinuses/Orbits: No acute finding. Other: None. IMPRESSION: Atrophic changes without acute abnormality. Electronically Signed   By: Inez Catalina M.D.   On: 12/29/2016 16:56   Ct Angio Chest Pe W Or Wo Contrast  Addendum Date: 12/29/2016   ADDENDUM REPORT: 12/29/2016 07:57 ADDENDUM: Critical Value/emergent results were called by telephone at the time of interpretation on 12/29/2016 at 7:33 am to Dr. Andrena Mews , who verbally acknowledged these results. Electronically Signed   By: Marin Olp M.D.   On: 12/29/2016 07:57   Result Date: 12/29/2016 CLINICAL DATA:  Spinal fusion 12/25/2016 with recent discharge. Now shortness-of-breath 2 days with fever, leg swelling and elevated D-dimer. EXAM: CT ANGIOGRAPHY CHEST WITH CONTRAST TECHNIQUE: Multidetector CT imaging of the chest was performed using the standard protocol during bolus administration of intravenous contrast. Multiplanar CT image reconstructions and MIPs were obtained to evaluate the vascular anatomy. CONTRAST:  100 mL Isovue 370 IV. COMPARISON:  Chest CT 12/25/2009 and chest x-ray 08/13/2016 and 12/28/2016. FINDINGS: Cardiovascular: Mild cardiomegaly. Subtle  calcified plaque over the left anterior descending coronary artery. Mild calcified plaque over the thoracic aorta. Mild dilatation of the ascending thoracic aorta measuring 3.8 cm in AP diameter. Pulmonary arterial system is well opacified and demonstrates several  small peripheral bilateral emboli. RV/LV ratio is 41.8/ 40.1= 1.04. Mediastinum/Nodes: No significant mediastinal or hilar adenopathy. Remaining mediastinal structures are unremarkable. Lungs/Pleura: Lungs are adequately inflated demonstrate tiny amount of bilateral pleural fluid and posterior bibasilar dependent atelectasis. Mild emphysematous disease over the upper lung/apices. Patchy airspace opacification over the mid to upper lungs which may be due to infection. Airways are normal. Upper Abdomen: Previous cholecystectomy. Calcified plaque over the abdominal aorta. 2.2 cm left renal cyst. Exophytic subcentimeter hypodensity over the upper pole right kidney likely a cyst. 1 cm cyst over the mid to upper pole right kidney. Musculoskeletal: Mild degenerate change of the spine. Review of the MIP images confirms the above findings. IMPRESSION: Several small peripheral bilateral pulmonary emboli. Positive for acute PE with CT evidence of right heart strain (RV/LV Ratio = 1.04) consistent with at least submassive (intermediate risk) PE. The presence of right heart strain has been associated with an increased risk of morbidity and mortality. Please activate Code PE by paging [phone]. Mild patchy airspace process over the mid to upper lungs. This may be due to infection. Tiny amount of bilateral pleural fluid and bibasilar dependent atelectasis. Mild emphysematous disease. Mild cardiomegaly. Mild dilatation of the ascending thoracic aorta measuring 3.8 cm. Recommend annual imaging followup by CTA or MRA. This recommendation follows 2010 ACCF/AHA/AATS/ACR/ASA/SCA/SCAI/SIR/STS/SVM Guidelines for the Diagnosis and Management of Patients with Thoracic Aortic Disease. Circulation.2010; 121: I458-K998. Bilateral renal cysts. Aortic Atherosclerosis (ICD10-I70.0) and Emphysema (ICD10-J43.9). Electronically Signed: By: Marin Olp M.D. On: 12/29/2016 07:27   Dg Chest Portable 1 View  Result Date: 12/28/2016 CLINICAL  DATA:  Shortness of breath. EXAM: PORTABLE CHEST 1 VIEW COMPARISON:  Chest x-rays from April 29, 2016 and August 13, 2016 FINDINGS: Evaluation of cardiac size is limited due to portable technique but the heart appears larger in the interval. The hila are symmetric. The mediastinum is unremarkable. No pneumothorax. Increased interstitial markings in the interval. No focal infiltrate. IMPRESSION: 1. The heart appears larger in the interval but this could be at least partially due to the portable technique. However, there is increased interstitial opacity bilaterally which could represent edema or atypical infection. Recommend a PA and lateral chest x-ray for better evaluation. Electronically Signed   By: Dorise Bullion III M.D   On: 12/28/2016 20:00    Assessment/Plan: Status post lumbar fusion: The patient is doing well neurologically. I'm going to start her on Keflex because of her wound drainage to hopefully prevent infection.  Pulmonary embolisms/deep venous thrombosis: The patient is on Eliquis.  I spoke with the patient's husband.  LOS: 1 day     Thorvald Orsino D 12/30/2016, 1:10 PM

## 2016-12-30 NOTE — Progress Notes (Signed)
  Echocardiogram 2D Echocardiogram has been performed.  Ann Silva 12/30/2016, 1:30 PM

## 2016-12-30 NOTE — NC FL2 (Signed)
[place] LEVEL OF CARE SCREENING TOOL     IDENTIFICATION  Patient Name: Ann Silva Birthdate: 1946/02/03 Sex: female Admission Date (Current Location): 12/28/2016  [facility] and [place] Number:  Engineering geologist and Address:  [facility], [place] [address] [place], [place] [address]      Provider Number: [id number]  Attending Physician Name and Address:  Kinnie Feil, MD  Relative Name and Phone Number:       Current Level of Care: Hospital Recommended Level of Care: [place] Prior Approval Number:    Date Approved/Denied:   PASRR Number:   [id number]  Discharge Plan: SNF    Current Diagnoses: Patient Active Problem List   Diagnosis Date Noted  . Dyspnea 12/29/2016  . Acute congestive heart failure ([place])   . HCAP (healthcare-associated pneumonia)   . Pulmonary embolus ([place])   . Lumbar adjacent segment disease with spondylolisthesis 12/25/2016  . COPD with acute exacerbation ([place]) 11/27/2015  . COPD exacerbation ([place]) 08/21/2015  . Restless legs 02/27/2015  . Hypertension   . Colon cancer ([place])   . Depression   . History of repair of hip joint 10/25/2013    Orientation RESPIRATION BLADDER Height & Weight     Self, Time, Situation, Place  O2 (3L [place]) Continent Weight: 188 lb 3.2 oz (85.4 kg) Height:  5\' 1"  (154.9 cm)  BEHAVIORAL SYMPTOMS/MOOD NEUROLOGICAL BOWEL NUTRITION STATUS      Continent Diet (cardiac)  AMBULATORY STATUS COMMUNICATION OF NEEDS Skin   Limited Assist Verbally Normal                       Personal Care Assistance Level of Assistance  Bathing, Dressing Bathing Assistance: Limited assistance   Dressing Assistance: Limited assistance     Functional Limitations Info             SPECIAL CARE FACTORS FREQUENCY  PT (By licensed PT), OT (By licensed OT)     PT Frequency: 5/wk OT Frequency: 5/wk            Contractures      Additional Factors Info  Code  Status, Allergies, Psychotropic Code Status Info: FULL Allergies Info: Hydrocodone-chlorpheniramine, Prednisone, Sulfa Antibiotics, Chlorhexidine, Methadone, Benadryl Diphenhydramine, Codeine, Diphenhydramine-zinc Acetate, Latex, Tape Psychotropic Info: zoloft         Current Medications (12/30/2016):  This is the current hospital active medication list Current Facility-Administered Medications  Medication Dose Route Frequency Provider Last Rate Last Dose  . acetaminophen (TYLENOL) tablet 650 mg  650 mg Oral Q6H PRN Diallo, Abdoulaye, MD       Or  . acetaminophen (TYLENOL) suppository 650 mg  650 mg Rectal Q6H PRN Diallo, Abdoulaye, MD      . apixaban (ELIQUIS) tablet 10 mg  10 mg Oral BID Norva Riffle, RPH       Followed by  . [START ON 01/06/2017] apixaban (ELIQUIS) tablet 5 mg  5 mg Oral BID Norva Riffle, [facility]      . cyclobenzaprine (FLEXERIL) tablet 10 mg  10 mg Oral TID PRN Diallo, Abdoulaye, MD   10 mg at 12/30/16 0816  . HYDROmorphone (DILAUDID) tablet 4 mg  4 mg Oral Q6H PRN Diallo, Abdoulaye, MD   4 mg at 12/30/16 6384  . MEDLINE mouth rinse  15 mL Mouth Rinse BID Andrena Mews T, MD   15 mL at 12/30/16 1040  . nicotine (NICODERM CQ - dosed in mg/24  hours) patch 14 mg  14 mg Transdermal Daily Diallo, Abdoulaye, MD   14 mg at 12/30/16 1041  . ondansetron (ZOFRAN) tablet 4 mg  4 mg Oral Q6H PRN Diallo, Abdoulaye, MD       Or  . ondansetron (ZOFRAN) injection 4 mg  4 mg Intravenous Q6H PRN Diallo, Abdoulaye, MD      . phenol (CHLORASEPTIC) mouth spray 1 spray  1 spray Mouth/Throat PRN Lucila Maine C, DO   1 spray at 12/30/16 0816  . polyethylene glycol (MIRALAX / GLYCOLAX) packet 17 g  17 g Oral Daily PRN Diallo, Abdoulaye, MD      . potassium chloride SA (K-DUR,KLOR-CON) CR tablet 40 mEq  40 mEq Oral BID Diallo, Abdoulaye, MD   40 mEq at 12/30/16 1041  . rOPINIRole (REQUIP) tablet 3 mg  3 mg Oral TID AC & HS Diallo, Abdoulaye, MD   3 mg at 12/30/16 0817  . senna  (SENOKOT) tablet 8.6 mg  1 tablet Oral BID Diallo, Abdoulaye, MD   8.6 mg at 12/30/16 1041  . sertraline (ZOLOFT) tablet 100 mg  100 mg Oral QHS Diallo, Abdoulaye, MD   100 mg at 12/29/16 2321     Discharge Medications: Please see discharge summary for a list of discharge medications.  Relevant Imaging Results:  Relevant Lab Results:   Additional Information SS#: [id number]  Jorge Ny, LCSW

## 2016-12-30 NOTE — Progress Notes (Signed)
Family Medicine Teaching Service Daily Progress Note Intern Pager: [phone]  Patient name: Ann Silva Medical record number: [id number] Date of birth: [birth year] Age: 71 y.o. Gender: female  Primary Care Provider: Rusty Aus, MD Consultants: None  Code Status: Full  Pt Overview and Major Events to Date:  Admitted on 6/23 to stepdown for SOB 6/24: CTA showed several small emboli and at least one submassive PE  Assessment and Plan: KADEJAH Silva is a 71 y.o. female with a past medical history significant for COPD, HTN, Chronic Lumbar back pain s/p spinal fusion surgery (on 12/25/2016), depression and lymphedema presenting with shortness of breath.  #Shortness of breath, acute, improving BNP 396.1 which is elevated, confirming element of volume overload as one of the contributing factor in patient dyspnea. Patient also found to had one large PE with multiple small infarcts which would also cause exacerbation of her dyspnea. Lastly patient is being treated for PNA which also likely playing a role in patient shortness of breath. Blood cultures with no growth to date. Patient is looking stable clinically given questionable pneumonia on admission, will discontinue antibiotic coverage and monitor clinically with a low threshold to restart if patient worsens.  --Discontinue Vancomycin 750 mg daily --Discontinue  Cefepime 1 g daily --Wean as tolerated from O2 --Start Duoneb q4 for 2 doses --Follow up on A1c --Acetaminophen 650 mg q6 prn --Zofran 4mg  q4 --Consult PT/OT  #Bilateral hand tremors, acute, improving Patient develop bilateral hand tremor yesterday morning as well as, an inability to move arm  subjective numbness. Given recent diagnosis of PE concerning for stroke. Neurology was consulted and thought it was increase physiologic tremor in the setting of disease. --Continue to monitor --Consider MRI if worsens  #PE, acute  Patient initial presentation concerning for PE with  recent surgery. D Dimers was elevated which lead to CTA which showed several small peripheral bilateral pulmonary emboli. Positive for acute PE with CT evidence of right heart strain consistent with at least submassive (intermediate risk) PE. --Discontinue Heparin drip --Start patient on Eliquis 5 mg bid --Will continue to monitor   #Pneumonia, acute Patient initially presented with shortness of breath in the setting of recent surgery and volume overloaded. However patient is febrile to 101.7 on admission and become hypotensive requiring IV fluid. patient also have increase work of breathing and low O2 saturation. CXR showed bilateral interstitial opacities concerning for possible infection. WBCs has trended down from 7.1 to 5.9. Blood cultures have shown no growth to date --Discontinue Vancomycin and cefepime  --O2 threapy as need --Continue breathing treatment --Wean from O2 as tolerated    #Elevated troponin, acute, improving Down trending 0.35>039>0.26>0.20. Likely secondary to demand in the setting of volume overload. Last ECHO win normal EF, last EKG was NSR --Follow up on Echocardiogram   #AKI On admission, Creatinine is 1.06, baseline around 0.8. Likely prerenal in the setting of poor po intake s/p recent surgery and increase sleepiness and fatigue at home with worsening respiratory status. This morning creatinine is 0.68. --Will follow up on am CMP  #Lumbar pain s/p spinal fusion surgery Patient with long history lumbar pain with initial operation 20 years ago. Patient had revision done for spinal fusion of her lumbar spine on 6/20 by Dr.Jenkins. Has started PT at home but felt she was worsening from a respiratory standpoint. Pain seem to be well controlled, and dressing looks dry with no sign of bleeding or drainage. --Continue hydromorphone 4 mg  q6 prn for severe pain --  Continue Flexeril 10 mg tid PRN --Acetaminophen 650 mg q6 prn for moderate pain --Miralax and Sennokot to  avoid contipation  #Hypertension  On initial presentation patient BP was elevated at 149/109. However patient quickly became hypotensive to 96/54 and continue to be hypotensive into the 80's for SBP and 76'A for diastolic. Patient was alert but complaint of dizziness. Received 500 ml  NS bolus with appropriate response and increase in BP. This am BP is 119/69. --Will continue to monitor --Continue to hold metoprolol 50 mg daily  #Restless less syndrome, chronic, stable Well controlled on home regimen --Will continue ropinirole 3 mg tid  #Depression Well controlled on home regimen --Will continue Sertraline 100 mg qhs  #Tobacco abuse Patient currently smoking about 1/4 pack a day. --Start nicotine patch 14 mg   FEN/GI: Heart Healthy PPx: Lovenox  Disposition: Home pending clinical improvement  Subjective:  Patient is feeling better this morning. Tremors still present but improving.  Objective: Temp:  [97.8 F (36.6 C)-98.7 F (37.1 C)] 98.3 F (36.8 C) (06/25 0300) Pulse Rate:  [66-79] 67 (06/25 0400) Resp:  [15-22] 19 (06/25 0400) BP: (100-119)/(61-71) 119/69 (06/25 0400) SpO2:  [96 %-98 %] 97 % (06/25 0400) Weight:  [188 lb 3.2 oz (85.4 kg)] 188 lb 3.2 oz (85.4 kg) (06/25 0230)   Physical Exam: General: Tired appearing woman, NAD, pleasant, able to participate in exam Cardiac: RRR, normal heart sounds, no murmurs. 2+ radial and PT pulses bilaterally, no JVD Respiratory: Normal effort, bilateral crackles upper and lower lung field  Abdomen: soft, nontender, nondistended, no hepatic or splenomegaly, +BS Extremities: Bilateral lower extremities edema, negative Homan sign's no cord palpated, bilateral hand tremors. Skin: warm and dry, no rashes noted Neuro: alert and oriented x4, no focal deficits Psych: Normal affect and mood   Laboratory:  Recent Labs Lab 12/28/16 1945 12/29/16 0725 12/30/16 0137  WBC 9.0 7.1 5.9  HGB 9.6* 8.8* 8.6*  HCT 29.5* 27.6*  26.7*  PLT 147* 141* 158    Recent Labs Lab 12/28/16 1945 12/29/16 0725 12/30/16 0137  NA 134* 133* 134*  Ann 3.4* 3.6 4.2  CL 101 102 103  CO2 25 24 25   BUN 21* 23* 15  CREATININE 1.06* 0.90 0.68  CALCIUM 9.5 9.1 9.2  PROT 6.2* 5.7*  --   BILITOT 0.9 0.7  --   ALKPHOS 83 72  --   ALT 14 14  --   AST 29 22  --   GLUCOSE 160* 146* 116*   Trop 0.35>0.39>0.26 BNP 396.1  Imaging/Diagnostic Tests: Ct Head Wo Contrast  Result Date: 12/29/2016 CLINICAL DATA:  Tremors EXAM: CT HEAD WITHOUT CONTRAST TECHNIQUE: Contiguous axial images were obtained from the base of the skull through the vertex without intravenous contrast. COMPARISON:  08/13/2016 FINDINGS: Brain: Mild atrophic changes are noted. No findings to suggest acute hemorrhage, acute infarction or space-occupying mass lesion are noted. Vascular: No hyperdense vessel or unexpected calcification. Skull: Normal. Negative for fracture or focal lesion. Sinuses/Orbits: No acute finding. Other: None. IMPRESSION: Atrophic changes without acute abnormality. Electronically Signed   By: Inez Catalina M.D.   On: 12/29/2016 16:56   Ct Angio Chest Pe W Or Wo Contrast  Addendum Date: 12/29/2016   ADDENDUM REPORT: 12/29/2016 07:57 ADDENDUM: Critical Value/emergent results were called by telephone at the time of interpretation on 12/29/2016 at 7:33 am to Dr. Andrena Mews , who verbally acknowledged these results. Electronically Signed   By: Marin Olp M.D.   On: 12/29/2016 07:57  Result Date: 12/29/2016 CLINICAL DATA:  Spinal fusion 12/25/2016 with recent discharge. Now shortness-of-breath 2 days with fever, leg swelling and elevated D-dimer. EXAM: CT ANGIOGRAPHY CHEST WITH CONTRAST TECHNIQUE: Multidetector CT imaging of the chest was performed using the standard protocol during bolus administration of intravenous contrast. Multiplanar CT image reconstructions and MIPs were obtained to evaluate the vascular anatomy. CONTRAST:  100 mL Isovue 370 IV.  COMPARISON:  Chest CT 12/25/2009 and chest x-ray 08/13/2016 and 12/28/2016. FINDINGS: Cardiovascular: Mild cardiomegaly. Subtle calcified plaque over the left anterior descending coronary artery. Mild calcified plaque over the thoracic aorta. Mild dilatation of the ascending thoracic aorta measuring 3.8 cm in AP diameter. Pulmonary arterial system is well opacified and demonstrates several small peripheral bilateral emboli. RV/LV ratio is 41.8/ 40.1= 1.04. Mediastinum/Nodes: No significant mediastinal or hilar adenopathy. Remaining mediastinal structures are unremarkable. Lungs/Pleura: Lungs are adequately inflated demonstrate tiny amount of bilateral pleural fluid and posterior bibasilar dependent atelectasis. Mild emphysematous disease over the upper lung/apices. Patchy airspace opacification over the mid to upper lungs which may be due to infection. Airways are normal. Upper Abdomen: Previous cholecystectomy. Calcified plaque over the abdominal aorta. 2.2 cm left renal cyst. Exophytic subcentimeter hypodensity over the upper pole right kidney likely a cyst. 1 cm cyst over the mid to upper pole right kidney. Musculoskeletal: Mild degenerate change of the spine. Review of the MIP images confirms the above findings. IMPRESSION: Several small peripheral bilateral pulmonary emboli. Positive for acute PE with CT evidence of right heart strain (RV/LV Ratio = 1.04) consistent with at least submassive (intermediate risk) PE. The presence of right heart strain has been associated with an increased risk of morbidity and mortality. Please activate Code PE by paging [phone]. Mild patchy airspace process over the mid to upper lungs. This may be due to infection. Tiny amount of bilateral pleural fluid and bibasilar dependent atelectasis. Mild emphysematous disease. Mild cardiomegaly. Mild dilatation of the ascending thoracic aorta measuring 3.8 cm. Recommend annual imaging followup by CTA or MRA. This recommendation  follows 2010 ACCF/AHA/AATS/ACR/ASA/SCA/SCAI/SIR/STS/SVM Guidelines for the Diagnosis and Management of Patients with Thoracic Aortic Disease. Circulation.2010; 121: O973-Z329. Bilateral renal cysts. Aortic Atherosclerosis (ICD10-I70.0) and Emphysema (ICD10-J43.9). Electronically Signed: By: Marin Olp M.D. On: 12/29/2016 07:27   Dg Chest Portable 1 View  Result Date: 12/28/2016 CLINICAL DATA:  Shortness of breath. EXAM: PORTABLE CHEST 1 VIEW COMPARISON:  Chest x-rays from April 29, 2016 and August 13, 2016 FINDINGS: Evaluation of cardiac size is limited due to portable technique but the heart appears larger in the interval. The hila are symmetric. The mediastinum is unremarkable. No pneumothorax. Increased interstitial markings in the interval. No focal infiltrate. IMPRESSION: 1. The heart appears larger in the interval but this could be at least partially due to the portable technique. However, there is increased interstitial opacity bilaterally which could represent edema or atypical infection. Recommend a PA and lateral chest x-ray for better evaluation. Electronically Signed   By: Dorise Bullion III M.D   On: 12/28/2016 20:00    Marjie Skiff, MD 12/30/2016, 7:08 AM PGY-1, [place] Intern pager: [phone], text pages welcome

## 2016-12-30 NOTE — Progress Notes (Signed)
ANTICOAGULATION CONSULT NOTE - Follow Up Consult  Pharmacy Consult for Heparin  Indication: pulmonary embolus  Patient Measurements: Height: 5\' 1"  (154.9 cm) Weight: 188 lb 3.2 oz (85.4 kg) IBW/kg (Calculated) : 47.8  Vital Signs: Temp: 98.1 F (36.7 C) (06/24 2346) Temp Source: Oral (06/24 2346) BP: 100/71 (06/24 2000) Pulse Rate: 71 (06/24 2346)  Labs:  Recent Labs  12/28/16 1945 12/29/16 0223 12/29/16 0725 12/29/16 1421 12/29/16 1422 12/30/16 0137  HGB 9.6*  --  8.8*  --   --  8.6*  HCT 29.5*  --  27.6*  --   --  26.7*  PLT 147*  --  141*  --   --  158  LABPROT 15.2  --   --   --   --   --   INR 1.19  --   --   --   --   --   HEPARINUNFRC  --   --   --   --  0.23* 0.18*  CREATININE 1.06*  --  0.90  --   --  0.68  TROPONINI  --  0.39* 0.26* 0.20*  --   --     Estimated Creatinine Clearance: 63 mL/min (by C-G formula based on SCr of 0.68 mg/dL).  Assessment: 71 y/o F on heparin for new onset PE, heparin level remains sub-therapeutic despite rate increase, Hgb low but stable, Plts 141>>158, no issues per RN.   Goal of Therapy:  Heparin level 0.3-0.7 units/ml Monitor platelets by anticoagulation protocol: Yes   Plan:  -Re-bolus heparin 2000 units/hr -Inc heparin drip to 1600 units/hr -1100 HL  [place] 12/30/2016,2:37 AM

## 2016-12-30 NOTE — Clinical Social Work Note (Signed)
Clinical Social Work Assessment  Patient Details  Name: Ann Silva MRN: [id number] Date of Birth: 09-24-1945  Date of referral:  12/30/16               Reason for consult:  Facility Placement                Permission sought to share information with:  Chartered certified accountant granted to share information::  Yes, Verbal Permission Granted  Name::     chuck  Agency::     Relationship::  husband  Contact Information:     Housing/Transportation Living arrangements for the past 2 months:  [place] of Information:  Patient, Spouse Patient Interpreter Needed:  None Criminal Activity/Legal Involvement Pertinent to Current Situation/Hospitalization:  No - Comment as needed Significant Relationships:  Spouse Lives with:  Spouse Do you feel safe going back to the place where you live?  No Need for family participation in patient care:  Yes (Comment)  Care giving concerns:  Patient lives at home with spouse.  Recently underwent back surgery but with back surgery and new medical concerns does not think spouse is capable of handling current medical needs.   Social Worker assessment / plan:  CSW spoke with pt and pt spouse concerning request for SNF.  Explained SNF referral process and need for 3 night qualifying stay in order to qualify for placement- pt expressed understanding.  Employment status:  Retired Forensic scientist:  Medicare PT Recommendations:  [place] / Referral to community resources:  [place]  Patient/Family's Response to care:  Patient agreeable to placement at SNF and even requested placement prior to recommendation being made.  Patient/Family's Understanding of and Emotional Response to Diagnosis, Current Treatment, and Prognosis:  Patient seems to have good understanding of condition and is realistic about her need for additional assistance.  Emotional Assessment Appearance:   Appears stated age Attitude/Demeanor/Rapport:    Affect (typically observed):  Appropriate Orientation:  Oriented to Self, Oriented to Place, Oriented to  Time, Oriented to Situation Alcohol / Substance use:  Not Applicable Psych involvement (Current and /or in the community):  No (Comment)  Discharge Needs  Concerns to be addressed:  Care Coordination Readmission within the last 30 days:  Yes Current discharge risk:  Physical Impairment Barriers to Discharge:      Jorge Ny, LCSW 12/30/2016, 12:46 PM

## 2016-12-30 NOTE — Progress Notes (Signed)
ANTICOAGULATION CONSULT NOTE - Follow Up Consult  Pharmacy Consult for Apixaban Indication: pulmonary embolus  Allergies  Allergen Reactions  . Hydrocodone-Chlorpheniramine Shortness Of Breath  . Prednisone Shortness Of Breath  . Sulfa Antibiotics Shortness Of Breath  . Chlorhexidine Rash  . Methadone Other (See Comments)    Altered Mental Status  . Benadryl [Diphenhydramine] Anxiety  . Codeine Anxiety  . Diphenhydramine-Zinc Acetate Anxiety  . Latex Rash  . Tape Rash    Patient Measurements: Height: 5\' 1"  (154.9 cm) Weight: 188 lb 3.2 oz (85.4 kg) IBW/kg (Calculated) : 47.8 Heparin Dosing Weight: 66.7kg  Vital Signs: Temp: 98.2 F (36.8 C) (06/25 0750) Temp Source: Oral (06/25 0750) BP: 102/59 (06/25 0750) Pulse Rate: 67 (06/25 0400)  Labs:  Recent Labs  12/28/16 1945 12/29/16 0223 12/29/16 0725 12/29/16 1421 12/29/16 1422 12/30/16 0137  HGB 9.6*  --  8.8*  --   --  8.6*  HCT 29.5*  --  27.6*  --   --  26.7*  PLT 147*  --  141*  --   --  158  LABPROT 15.2  --   --   --   --   --   INR 1.19  --   --   --   --   --   HEPARINUNFRC  --   --   --   --  0.23* 0.18*  CREATININE 1.06*  --  0.90  --   --  0.68  TROPONINI  --  0.39* 0.26* 0.20*  --   --     Estimated Creatinine Clearance: 63 mL/min (by C-G formula based on SCr of 0.68 mg/dL).   Medications:  Heparin @ 1200 units/hr  Assessment: 71yof started on heparin 6/24 for PE. She is now to transition to apixaban. No bleeding complications noted.  Goal of Therapy:  Monitor platelets by anticoagulation protocol: Yes   Plan:  Stop Heparin Apixaban 10mg  PO BID x 7 days, then 5mg  PO BID Monitor for bleeding complications  Legrand Como, Pharm.D., BCPS, AAHIVP Clinical Pharmacist Phone: [phone] or [phone] 12/30/2016, 10:55 AM

## 2016-12-31 LAB — BASIC METABOLIC PANEL
ANION GAP: 6 (ref 5–15)
BUN: 10 mg/dL (ref 6–20)
CO2: 27 mmol/L (ref 22–32)
Calcium: 9.8 mg/dL (ref 8.9–10.3)
Chloride: 105 mmol/L (ref 101–111)
Creatinine, Ser: 0.66 mg/dL (ref 0.44–1.00)
Glucose, Bld: 110 mg/dL — ABNORMAL HIGH (ref 65–99)
POTASSIUM: 5.4 mmol/L — AB (ref 3.5–5.1)
SODIUM: 138 mmol/L (ref 135–145)

## 2016-12-31 LAB — CBC
HCT: 28.7 % — ABNORMAL LOW (ref 36.0–46.0)
Hemoglobin: 8.9 g/dL — ABNORMAL LOW (ref 12.0–15.0)
MCH: 29.8 pg (ref 26.0–34.0)
MCHC: 31 g/dL (ref 30.0–36.0)
MCV: 96 fL (ref 78.0–100.0)
PLATELETS: 194 10*3/uL (ref 150–400)
RBC: 2.99 MIL/uL — ABNORMAL LOW (ref 3.87–5.11)
RDW: 14.5 % (ref 11.5–15.5)
WBC: 5.4 10*3/uL (ref 4.0–10.5)

## 2016-12-31 MED ORDER — MENTHOL 3 MG MT LOZG: 1.0000 | LOZENGE | OROMUCOSAL | Status: DC | PRN

## 2016-12-31 MED ORDER — FUROSEMIDE 20 MG PO TABS
20.0000 mg | ORAL_TABLET | Freq: Two times a day (BID) | ORAL | Status: DC
  Administered 2016-12-31 – 2017-01-01 (×2): 20 mg via ORAL
  Filled 2016-12-31 (×2): qty 1

## 2016-12-31 NOTE — Discharge Instructions (Signed)
Information on my medicine - ELIQUIS (apixaban)  This medication education was reviewed with me or my healthcare representative as part of my discharge preparation.    Why was Eliquis prescribed for you? Eliquis was prescribed to treat blood clots that may have been found in the veins of your legs (deep vein thrombosis) or in your lungs (pulmonary embolism) and to reduce the risk of them occurring again.  What do You need to know about Eliquis ? The starting dose is 10 mg (two 5 mg tablets) taken TWICE daily for the FIRST SEVEN (7) DAYS, then on July 2nd, 2018 the dose is reduced to ONE 5 mg tablet taken TWICE daily.  Eliquis may be taken with or without food.   Try to take the dose about the same time in the morning and in the evening. If you have difficulty swallowing the tablet whole please discuss with your pharmacist how to take the medication safely.  Take Eliquis exactly as prescribed and DO NOT stop taking Eliquis without talking to the doctor who prescribed the medication.  Stopping may increase your risk of developing a new blood clot.  Refill your prescription before you run out.  After discharge, you should have regular check-up appointments with your healthcare provider that is prescribing your Eliquis.    What do you do if you miss a dose? If a dose of ELIQUIS is not taken at the scheduled time, take it as soon as possible on the same day and twice-daily administration should be resumed. The dose should not be doubled to make up for a missed dose.  Important Safety Information A possible side effect of Eliquis is bleeding. You should call your healthcare provider right away if you experience any of the following: ? Bleeding from an injury or your nose that does not stop. ? Unusual colored urine (red or dark brown) or unusual colored stools (red or black). ? Unusual bruising for unknown reasons. ? A serious fall or if you hit your head (even if there is no  bleeding).  Some medicines may interact with Eliquis and might increase your risk of bleeding or clotting while on Eliquis. To help avoid this, consult your healthcare provider or pharmacist prior to using any new prescription or non-prescription medications, including herbals, vitamins, non-steroidal anti-inflammatory drugs (NSAIDs) and supplements.  This website has more information on Eliquis (apixaban): http://[email or web address].com/[email or web address]

## 2016-12-31 NOTE — Discharge Summary (Signed)
[facility] Discharge Summary  Patient name: Ann Silva Medical record number: [id number] Date of birth: 12/18/1945 Age: 71 y.o. Gender: female Date of Admission: 12/28/2016 Date of Discharge: 12/31/2013 Admitting Physician: Kinnie Feil, MD  Primary Care Provider: Rusty Aus, MD Consultants: Neurology, Neurosurgery  Indication for Hospitalization: Shortness of breath and increase work of breathing  Discharge Diagnoses/Problem List:  Pulmonary Embolism COPD HTN Lumbar pain s/p fusion surgery Lymphedema  Disposition: SNF  Discharge Condition: Stable   Discharge Exam:  General: Tired appearing woman, NAD, pleasant, able to participate in exam Cardiac: RRR, normal heart sounds, no murmurs. 2+ radial and PT pulses bilaterally, no JVD Respiratory: Normal effort, bilateral crackles upper and lower lung field  Abdomen: soft, nontender, nondistended, no hepatic or splenomegaly, +BS Extremities:Bilateral lower extremities edema, negative Homan sign's no cord palpated, bilateral hand tremors. Skin:warm and dry, no rashes noted Neuro: alert and oriented x4, no focal deficits Psych:Normal affect and mood  Brief Hospital Course:  Ann Silva is a 71 y.o. female who was admitted on 6/24 for shortness of breath and increase work of breathing s/p spinal fusion surgery on 6/21. On admission, patient found to be hypoxic, febrile, tachycardic and hypotensive. Lung exam with diffuse crackles Patient was started on 4L [place] after briefly being on CPAP during transport with EMS. Patient was also started on vancomycin and cefepime for HAP after CXR showed interstitial opacities that could be consistent with pneumonia. Troponins were also elevated at 0.35 but EKG only showed sinus tachycardia w/ RBBB. Patient received lasix in the ED and then was bolused for hypotension. Given recent surgery, primary team had a high suspicion for PE. D Dimers was elevated and  CTA was done and showed several small peripheral bilateral pulmonary emboli positive for acute PE with CT evidence of right heart strain. Patient was started on heparin drip and ECHO was ordered. BNP was also elevated. Over the next 24 hours, patient dyspnea improved and she was gradually weaned off oxygen. Patient developed bilateral hand tremors on hospital day 2 and arm weakness concerning for possible CVA event. Neurology was consulted and CTA was normal. Tremors thought to be physiologic and accentuated during illness. Echo showed a mildly reduced EF 55-60% , but no evidence of right heart strain. Patient continue to improve, antibiotics for possible pneumonia were discontinued and patient monitor. On hospital day 3 patient continue to improve with normal vitals. Patient was transitioned to eliquis for PE and maintain good oxygen saturation on RA. Neurosurgery saw patient and assessed wound from Lumbar fusion surgery.  Patient was started on Keflex for mild drainage from the wound though healing well. Given limited mobility and extended hospital stay patient was discharged to SNF per PT recommendation. On discharge day patient was stable and continued to clinically improve with no oxygen requirement.  Issues for Follow Up:  1. Patient was diagnosed with PE and started on Eliquis. Monitor respiratory status and bleeding signs. 2. New onset tremors, appears to be physiologic in setting of illness, ensure improvement/resolution 3. Follow up with Neurosurgery for spinal fusion, patient started on Keflex on 6/25 for wound discharge.  Significant Procedures: None  Significant Labs and Imaging:   Recent Labs Lab 12/30/16 0137 12/31/16 0310 01/01/17 0521  WBC 5.9 5.4 5.0  HGB 8.6* 8.9* 9.7*  HCT 26.7* 28.7* 31.1*  PLT 158 194 240    Recent Labs Lab 12/28/16 1945 12/29/16 0725 12/30/16 0137 12/31/16 0310 01/01/17 0521  NA 134* 133*  134* 138 135  K 3.4* 3.6 4.2 5.4* 4.3  CL 101 102 103 105  100*  CO2 25 24 25 27 26   GLUCOSE 160* 146* 116* 110* 117*  BUN 21* 23* 15 10 10   CREATININE 1.06* 0.90 0.68 0.66 0.86  CALCIUM 9.5 9.1 9.2 9.8 10.0  ALKPHOS 83 72  --   --   --   AST 29 22  --   --   --   ALT 14 14  --   --   --   ALBUMIN 2.8* 2.4*  --   --   --    Ct Head Wo Contrast  Result Date: 12/29/2016 CLINICAL DATA:  Tremors EXAM: CT HEAD WITHOUT CONTRAST TECHNIQUE: Contiguous axial images were obtained from the base of the skull through the vertex without intravenous contrast. COMPARISON:  08/13/2016 FINDINGS: Brain: Mild atrophic changes are noted. No findings to suggest acute hemorrhage, acute infarction or space-occupying mass lesion are noted. Vascular: No hyperdense vessel or unexpected calcification. Skull: Normal. Negative for fracture or focal lesion. Sinuses/Orbits: No acute finding. Other: None. IMPRESSION: Atrophic changes without acute abnormality. Electronically Signed   By: Inez Catalina M.D.   On: 12/29/2016 16:56   Ct Angio Chest Pe W Or Wo Contrast  Addendum Date: 12/29/2016   ADDENDUM REPORT: 12/29/2016 07:57 ADDENDUM: Critical Value/emergent results were called by telephone at the time of interpretation on 12/29/2016 at 7:33 am to Dr. Andrena Mews , who verbally acknowledged these results. Electronically Signed   By: Marin Olp M.D.   On: 12/29/2016 07:57   Result Date: 12/29/2016 CLINICAL DATA:  Spinal fusion 12/25/2016 with recent discharge. Now shortness-of-breath 2 days with fever, leg swelling and elevated D-dimer. EXAM: CT ANGIOGRAPHY CHEST WITH CONTRAST TECHNIQUE: Multidetector CT imaging of the chest was performed using the standard protocol during bolus administration of intravenous contrast. Multiplanar CT image reconstructions and MIPs were obtained to evaluate the vascular anatomy. CONTRAST:  100 mL Isovue 370 IV. COMPARISON:  Chest CT 12/25/2009 and chest x-ray 08/13/2016 and 12/28/2016. FINDINGS: Cardiovascular: Mild cardiomegaly. Subtle calcified  plaque over the left anterior descending coronary artery. Mild calcified plaque over the thoracic aorta. Mild dilatation of the ascending thoracic aorta measuring 3.8 cm in AP diameter. Pulmonary arterial system is well opacified and demonstrates several small peripheral bilateral emboli. RV/LV ratio is 41.8/ 40.1= 1.04. Mediastinum/Nodes: No significant mediastinal or hilar adenopathy. Remaining mediastinal structures are unremarkable. Lungs/Pleura: Lungs are adequately inflated demonstrate tiny amount of bilateral pleural fluid and posterior bibasilar dependent atelectasis. Mild emphysematous disease over the upper lung/apices. Patchy airspace opacification over the mid to upper lungs which may be due to infection. Airways are normal. Upper Abdomen: Previous cholecystectomy. Calcified plaque over the abdominal aorta. 2.2 cm left renal cyst. Exophytic subcentimeter hypodensity over the upper pole right kidney likely a cyst. 1 cm cyst over the mid to upper pole right kidney. Musculoskeletal: Mild degenerate change of the spine. Review of the MIP images confirms the above findings. IMPRESSION: Several small peripheral bilateral pulmonary emboli. Positive for acute PE with CT evidence of right heart strain (RV/LV Ratio = 1.04) consistent with at least submassive (intermediate risk) PE. The presence of right heart strain has been associated with an increased risk of morbidity and mortality. Please activate Code PE by paging [phone]. Mild patchy airspace process over the mid to upper lungs. This may be due to infection. Tiny amount of bilateral pleural fluid and bibasilar dependent atelectasis. Mild emphysematous disease. Mild cardiomegaly. Mild dilatation of  the ascending thoracic aorta measuring 3.8 cm. Recommend annual imaging followup by CTA or MRA. This recommendation follows 2010 ACCF/AHA/AATS/ACR/ASA/SCA/SCAI/SIR/STS/SVM Guidelines for the Diagnosis and Management of Patients with Thoracic Aortic Disease.  Circulation.2010; 121: L390-Z009. Bilateral renal cysts. Aortic Atherosclerosis (ICD10-I70.0) and Emphysema (ICD10-J43.9). Electronically Signed: By: Marin Olp M.D. On: 12/29/2016 07:27   Dg Chest Portable 1 View  Result Date: 12/28/2016 CLINICAL DATA:  Shortness of breath. EXAM: PORTABLE CHEST 1 VIEW COMPARISON:  Chest x-rays from April 29, 2016 and August 13, 2016 FINDINGS: Evaluation of cardiac size is limited due to portable technique but the heart appears larger in the interval. The hila are symmetric. The mediastinum is unremarkable. No pneumothorax. Increased interstitial markings in the interval. No focal infiltrate. IMPRESSION: 1. The heart appears larger in the interval but this could be at least partially due to the portable technique. However, there is increased interstitial opacity bilaterally which could represent edema or atypical infection. Recommend a PA and lateral chest x-ray for better evaluation. Electronically Signed   By: Dorise Bullion III M.D   On: 12/28/2016 20:00    Results/Tests Pending at Time of Discharge: None  Discharge Medications:  Allergies as of 01/01/2017      Reactions   Hydrocodone-chlorpheniramine Shortness Of Breath   Prednisone Shortness Of Breath   Sulfa Antibiotics Shortness Of Breath   Chlorhexidine Rash   Methadone Other (See Comments)   Altered Mental Status   Benadryl [diphenhydramine] Anxiety   Codeine Anxiety   Diphenhydramine-zinc Acetate Anxiety   Latex Rash   Tape Rash      Medication List    TAKE these medications   acetaminophen 500 MG tablet Commonly known as:  TYLENOL Take 1,000 mg by mouth every 6 (six) hours as needed (for pain/back pain.).   albuterol 108 (90 Base) MCG/ACT inhaler Commonly known as:  PROVENTIL HFA;VENTOLIN HFA Inhale 2 puffs into the lungs every 4 (four) hours as needed for wheezing or shortness of breath.   apixaban 2.5 MG Tabs tablet Commonly known as:  ELIQUIS Take 1 tablet (2.5 mg total) by  mouth 2 (two) times daily.   budesonide-formoterol 160-4.5 MCG/ACT inhaler Commonly known as:  SYMBICORT Inhale 2 puffs into the lungs 2 (two) times daily. What changed:  when to take this  reasons to take this   bumetanide 2 MG tablet Commonly known as:  BUMEX Take 2 mg by mouth every 3 (three) days.   CALTRATE 600+D PLUS PO Take 1 tablet by mouth 2 (two) times daily.   cephALEXin 500 MG capsule Commonly known as:  KEFLEX Take 1 capsule (500 mg total) by mouth every 6 (six) hours.   CERAVE Crea Apply 1 application topically 2 (two) times daily. Applied neck down for dermatitis   cyclobenzaprine 10 MG tablet Commonly known as:  FLEXERIL Take 1 tablet (10 mg total) by mouth 3 (three) times daily as needed for muscle spasms.   docusate sodium 100 MG capsule Commonly known as:  COLACE Take 1 capsule (100 mg total) by mouth 2 (two) times daily.   fluticasone 50 MCG/ACT nasal spray Commonly known as:  FLONASE Place 1 spray into both nostrils daily as needed (for sinus issues).   furosemide 20 MG tablet Commonly known as:  LASIX Take 1 tablet (20 mg total) by mouth daily. What changed:  when to take this   HYDROmorphone 4 MG tablet Commonly known as:  DILAUDID Take 1 tablet (4 mg total) by mouth every 6 (six) hours as needed for  severe pain.   ipratropium-albuterol 0.5-2.5 (3) MG/3ML Soln Commonly known as:  DUONEB Take 3 mLs by nebulization every 6 (six) hours as needed (for wheezing/shortness of breath).   metoprolol succinate 50 MG 24 hr tablet Commonly known as:  TOPROL-XL Take 1 tablet (50 mg total) by mouth every evening. Take with or immediately following a meal. What changed:  when to take this  additional instructions   mometasone 0.1 % cream Commonly known as:  ELOCON Apply 1 application topically 2 (two) times daily as needed (for dermatitis). For dermatitis   mupirocin ointment 2 % Commonly known as:  BACTROBAN Apply 1 application topically 2  (two) times daily as needed. For skin infection.   ondansetron 4 MG disintegrating tablet Commonly known as:  ZOFRAN ODT Take 1 tablet (4 mg total) by mouth every 8 (eight) hours as needed for nausea or vomiting. What changed:  additional instructions   rOPINIRole 3 MG tablet Commonly known as:  REQUIP Take 1 tablet (3 mg total) by mouth 4 (four) times daily.   sertraline 100 MG tablet Commonly known as:  ZOLOFT Take 100 mg by mouth at bedtime.   spironolactone 25 MG tablet Commonly known as:  ALDACTONE Take 25 mg by mouth at bedtime.   Vitamin B-12 2500 MCG Subl Place 2,500 mcg under the tongue at bedtime.   VITRON-C PO Take 1 tablet by mouth at bedtime.       Discharge Instructions: Please refer to Patient Instructions section of EMR for full details.  Patient was counseled important signs and symptoms that should prompt return to medical care, changes in medications, dietary instructions, activity restrictions, and follow up appointments.   Follow-Up Appointments:   Marjie Skiff, MD 01/01/2017, 12:15 PM PGY-1, [place]

## 2016-12-31 NOTE — Progress Notes (Signed)
CSW provided pt husband with bed offers- they choose [facility]  CSW will continue to follow  Jorge Ny, [place] Social Worker [phone]

## 2016-12-31 NOTE — Progress Notes (Signed)
Family Medicine Teaching Service Daily Progress Note Intern Pager: [phone]  Patient name: Ann Silva Medical record number: [id number] Date of birth: 12-Jan-1946 Age: 71 y.o. Gender: female  Primary Care Provider: Rusty Aus, MD Consultants: None  Code Status: Full  Pt Overview and Major Events to Date:  Admitted on 6/23 to stepdown for SOB 6/24: CTA showed several small emboli and at least one submassive PE  Assessment and Plan: Ann Silva is a 71 y.o. female with a past medical history significant for COPD, HTN, Chronic Lumbar back pain s/p spinal fusion surgery (on 12/25/2016), depression and lymphedema presenting with shortness of breath.  #Shortness of breath, acute, improving Patient currently on 2L [place] w/O2 sat in the mid 90's.  Patient  with known  PE with multiple small infarcts, contributing to new oxygen requirement. Had a low suspicion for pneumonia, initially treated but has been off antibiotics for about 24 hrs with normal vitals.  --Wean  as tolerated from O2 --Acetaminophen 650 mg q6 prn --Zofran 4mg  q4  #Bilateral hand tremors, acute, improving Patient develop bilateral hand tremors yesterday morning as well as, an inability to move arm  subjective numbness. Given recent diagnosis of PE concerning for stroke. Neurology was consulted and thought it was increase physiologic tremor in the setting of illness. Tremors continue to improve. --Continue to monitor --Consider MRI if worsens  #PE, acute  Patient initial presentation concerning for PE with recent surgery.CTA showed several small peripheral bilateral pulmonary emboli. Patient transitioned to [place] from heparin drip. --Continue Eliquis 5 mg bid --Will continue to monitor   #Pneumonia, acute Patient initially febrile on admission with interstitial opacities concerning for pneumonia. Started on antibiotics, WBCs within normal limit and patient clinical appears well. Antibiotics regimen has been  discontinued for 24 hrs and patient continue to well. Cultures still show no growth. This am WBC is 5.4.  --O2 threapy as need --Continue breathing treatment --Wean from O2 as tolerated   #Elevated troponin, acute, improving Down trending 0.35>039>0.26>0.20. Likely secondary to demand in the setting of volume overload. Echocardiogram revealed an EF 55%-60%, w/ normal systolic function and no regional wall motion. Patient has a history of lymphedema and was on lasix prior to admission. Crackles noted on exam, will restart diuretics treatment. --Resume lasix 20 mg po bid --Monitor I/o --Monitor weight  #AKI, resolved On admission, Creatinine is 1.06, baseline around 0.8. Likely prerenal in the setting of poor po intake w/ recent surgery and increase sleepiness and fatigue at home with worsening respiratory status. This morning creatinine is 0.68. --Will follow up on am CMP  #Lumbar pain s/p spinal fusion surgery Seen by Dr. Arnoldo Morale yesterday, patient progressing appropriately from a neurosurgery standpoint. Wound appears to be healing well except for small amount of drainage for which she was started on keflex by Neurosurgery. --Continue Keflex 500 mg q6 (start 6/25) Day 2 --Continue hydromorphone 4 mg  q6 prn for severe pain --Continue Flexeril 10 mg tid PRN --Acetaminophen 650 mg q6 prn for moderate pain --Miralax and Sennokot to avoid constipation  #Hypertension, resolved  On initial presentation patient BP was elevated at 149/109. Patient continue to be normotensive. This am BP is . --Will continue to monitor --Continue to hold metoprolol 50 mg daily  #Restless less syndrome, chronic, stable Well controlled on home regimen --Will continue ropinirole 3 mg tid  #Depression Well controlled on home regimen --Will continue Sertraline 100 mg qhs  #Tobacco abuse Patient currently smoking about 1/4 pack a day. --Start  nicotine patch 14 mg  #Social Patient has requested SNF  placement upon discharge and has been in touch with SW. PT has evaluated and also recommend SNF. --Will follow up on placement    FEN/GI: Heart Healthy PPx: Lovenox  Disposition: SNF pending clinical improvement  Subjective:  Patient is feeling better this morning, tremors has improve and she is off of oxygen. Patient is eager to leave and would prefer to go to a SNF.  Objective: Temp:  [98.1 F (36.7 C)-100 F (37.8 C)] 100 F (37.8 C) (06/25 2012) Pulse Rate:  [67-78] 78 (06/25 2200) Resp:  [19-29] 29 (06/25 2200) BP: (100-119)/(53-69) 103/53 (06/25 2200) SpO2:  [97 %-100 %] 99 % (06/25 2200) Weight:  [188 lb 3.2 oz (85.4 kg)] 188 lb 3.2 oz (85.4 kg) (06/25 0230)   Physical Exam: General: Tired appearing woman, NAD, pleasant, able to participate in exam Cardiac: RRR, normal heart sounds, no murmurs. 2+ radial and PT pulses bilaterally, no JVD Respiratory: Normal effort, bilateral crackles upper and lower lung field  Abdomen: soft, nontender, nondistended, no hepatic or splenomegaly, +BS Extremities: Bilateral lower extremities edema, negative Homan sign's no cord palpated, bilateral hand tremors. Skin: warm and dry, no rashes noted Neuro: alert and oriented x4, no focal deficits Psych: Normal affect and mood   Laboratory:  Recent Labs Lab 12/28/16 1945 12/29/16 0725 12/30/16 0137  WBC 9.0 7.1 5.9  HGB 9.6* 8.8* 8.6*  HCT 29.5* 27.6* 26.7*  PLT 147* 141* 158    Recent Labs Lab 12/28/16 1945 12/29/16 0725 12/30/16 0137  NA 134* 133* 134*  K 3.4* 3.6 4.2  CL 101 102 103  CO2 25 24 25   BUN 21* 23* 15  CREATININE 1.06* 0.90 0.68  CALCIUM 9.5 9.1 9.2  PROT 6.2* 5.7*  --   BILITOT 0.9 0.7  --   ALKPHOS 83 72  --   ALT 14 14  --   AST 29 22  --   GLUCOSE 160* 146* 116*   Trop 0.35>0.39>0.26 BNP 396.1 A1c 5.2  Imaging/Diagnostic Tests: Ct Head Wo Contrast  Result Date: 12/29/2016 CLINICAL DATA:  Tremors EXAM: CT HEAD WITHOUT CONTRAST TECHNIQUE:  Contiguous axial images were obtained from the base of the skull through the vertex without intravenous contrast. COMPARISON:  08/13/2016 FINDINGS: Brain: Mild atrophic changes are noted. No findings to suggest acute hemorrhage, acute infarction or space-occupying mass lesion are noted. Vascular: No hyperdense vessel or unexpected calcification. Skull: Normal. Negative for fracture or focal lesion. Sinuses/Orbits: No acute finding. Other: None. IMPRESSION: Atrophic changes without acute abnormality. Electronically Signed   By: Inez Catalina M.D.   On: 12/29/2016 16:56   Ct Angio Chest Pe W Or Wo Contrast  Addendum Date: 12/29/2016   ADDENDUM REPORT: 12/29/2016 07:57 ADDENDUM: Critical Value/emergent results were called by telephone at the time of interpretation on 12/29/2016 at 7:33 am to Dr. Andrena Mews , who verbally acknowledged these results. Electronically Signed   By: Marin Olp M.D.   On: 12/29/2016 07:57   Result Date: 12/29/2016 CLINICAL DATA:  Spinal fusion 12/25/2016 with recent discharge. Now shortness-of-breath 2 days with fever, leg swelling and elevated D-dimer. EXAM: CT ANGIOGRAPHY CHEST WITH CONTRAST TECHNIQUE: Multidetector CT imaging of the chest was performed using the standard protocol during bolus administration of intravenous contrast. Multiplanar CT image reconstructions and MIPs were obtained to evaluate the vascular anatomy. CONTRAST:  100 mL Isovue 370 IV. COMPARISON:  Chest CT 12/25/2009 and chest x-ray 08/13/2016 and 12/28/2016. FINDINGS: Cardiovascular: Mild  cardiomegaly. Subtle calcified plaque over the left anterior descending coronary artery. Mild calcified plaque over the thoracic aorta. Mild dilatation of the ascending thoracic aorta measuring 3.8 cm in AP diameter. Pulmonary arterial system is well opacified and demonstrates several small peripheral bilateral emboli. RV/LV ratio is 41.8/ 40.1= 1.04. Mediastinum/Nodes: No significant mediastinal or hilar adenopathy.  Remaining mediastinal structures are unremarkable. Lungs/Pleura: Lungs are adequately inflated demonstrate tiny amount of bilateral pleural fluid and posterior bibasilar dependent atelectasis. Mild emphysematous disease over the upper lung/apices. Patchy airspace opacification over the mid to upper lungs which may be due to infection. Airways are normal. Upper Abdomen: Previous cholecystectomy. Calcified plaque over the abdominal aorta. 2.2 cm left renal cyst. Exophytic subcentimeter hypodensity over the upper pole right kidney likely a cyst. 1 cm cyst over the mid to upper pole right kidney. Musculoskeletal: Mild degenerate change of the spine. Review of the MIP images confirms the above findings. IMPRESSION: Several small peripheral bilateral pulmonary emboli. Positive for acute PE with CT evidence of right heart strain (RV/LV Ratio = 1.04) consistent with at least submassive (intermediate risk) PE. The presence of right heart strain has been associated with an increased risk of morbidity and mortality. Please activate Code PE by paging [phone]. Mild patchy airspace process over the mid to upper lungs. This may be due to infection. Tiny amount of bilateral pleural fluid and bibasilar dependent atelectasis. Mild emphysematous disease. Mild cardiomegaly. Mild dilatation of the ascending thoracic aorta measuring 3.8 cm. Recommend annual imaging followup by CTA or MRA. This recommendation follows 2010 ACCF/AHA/AATS/ACR/ASA/SCA/SCAI/SIR/STS/SVM Guidelines for the Diagnosis and Management of Patients with Thoracic Aortic Disease. Circulation.2010; 121: X323-F573. Bilateral renal cysts. Aortic Atherosclerosis (ICD10-I70.0) and Emphysema (ICD10-J43.9). Electronically Signed: By: Marin Olp M.D. On: 12/29/2016 07:27    Marjie Skiff, MD 12/31/2016, 1:00 AM PGY-1, [place] Intern pager: [phone], text pages welcome

## 2016-12-31 NOTE — Care Management Note (Signed)
Case Management Note  Patient Details  Name: Ann Silva MRN: [id number] Date of Birth: 1946/04/27  Subjective/Objective:    PNA, PE,  Lumbar pain s/p spinal fusion surgery                Action/Plan: Discharge Planning: NCM spoke to husband, [place] at bedside. States plan is SNF rehab. His first choice is [place], [place] or [place]. CSW following for SNF placement. Scheduled dc tomorrow to rehab.  PCP Emily Filbert F MD   Expected Discharge Date:                  Expected Discharge Plan:  [place]  In-House Referral:  Clinical Social Work  Discharge planning Services  CM Consult  Post Acute Care Choice:  NA Choice offered to:  NA  DME Arranged:  N/A DME Agency:  NA  HH Arranged:  NA HH Agency:  NA  Status of Service:  Completed, signed off  If discussed at [place] of Stay Meetings, dates discussed:    Additional Comments:  Erenest Rasher, RN 12/31/2016, 4:03 PM

## 2017-01-01 DIAGNOSIS — S29002A Unspecified injury of muscle and tendon of back wall of thorax, initial encounter: Secondary | ICD-10-CM | POA: Diagnosis not present

## 2017-01-01 DIAGNOSIS — I10 Essential (primary) hypertension: Secondary | ICD-10-CM | POA: Diagnosis not present

## 2017-01-01 DIAGNOSIS — M549 Dorsalgia, unspecified: Secondary | ICD-10-CM | POA: Diagnosis not present

## 2017-01-01 DIAGNOSIS — I2699 Other pulmonary embolism without acute cor pulmonale: Secondary | ICD-10-CM | POA: Diagnosis not present

## 2017-01-01 DIAGNOSIS — R531 Weakness: Secondary | ICD-10-CM | POA: Diagnosis not present

## 2017-01-01 DIAGNOSIS — Z86711 Personal history of pulmonary embolism: Secondary | ICD-10-CM | POA: Diagnosis not present

## 2017-01-01 DIAGNOSIS — R488 Other symbolic dysfunctions: Secondary | ICD-10-CM | POA: Diagnosis not present

## 2017-01-01 DIAGNOSIS — M4326 Fusion of spine, lumbar region: Secondary | ICD-10-CM | POA: Diagnosis not present

## 2017-01-01 DIAGNOSIS — M6281 Muscle weakness (generalized): Secondary | ICD-10-CM | POA: Diagnosis not present

## 2017-01-01 DIAGNOSIS — J449 Chronic obstructive pulmonary disease, unspecified: Secondary | ICD-10-CM | POA: Diagnosis not present

## 2017-01-01 DIAGNOSIS — Z4889 Encounter for other specified surgical aftercare: Secondary | ICD-10-CM | POA: Diagnosis not present

## 2017-01-01 DIAGNOSIS — L089 Local infection of the skin and subcutaneous tissue, unspecified: Secondary | ICD-10-CM | POA: Diagnosis not present

## 2017-01-01 DIAGNOSIS — R0600 Dyspnea, unspecified: Secondary | ICD-10-CM | POA: Diagnosis not present

## 2017-01-01 DIAGNOSIS — R2681 Unsteadiness on feet: Secondary | ICD-10-CM | POA: Diagnosis not present

## 2017-01-01 DIAGNOSIS — R262 Difficulty in walking, not elsewhere classified: Secondary | ICD-10-CM | POA: Diagnosis not present

## 2017-01-01 LAB — CBC
HEMATOCRIT: 31.1 % — AB (ref 36.0–46.0)
Hemoglobin: 9.7 g/dL — ABNORMAL LOW (ref 12.0–15.0)
MCH: 29.8 pg (ref 26.0–34.0)
MCHC: 31.2 g/dL (ref 30.0–36.0)
MCV: 95.4 fL (ref 78.0–100.0)
Platelets: 240 10*3/uL (ref 150–400)
RBC: 3.26 MIL/uL — ABNORMAL LOW (ref 3.87–5.11)
RDW: 14.5 % (ref 11.5–15.5)
WBC: 5 10*3/uL (ref 4.0–10.5)

## 2017-01-01 LAB — BASIC METABOLIC PANEL
ANION GAP: 9 (ref 5–15)
BUN: 10 mg/dL (ref 6–20)
CALCIUM: 10 mg/dL (ref 8.9–10.3)
CHLORIDE: 100 mmol/L — AB (ref 101–111)
CO2: 26 mmol/L (ref 22–32)
CREATININE: 0.86 mg/dL (ref 0.44–1.00)
GFR calc non Af Amer: >60 mL/min (ref >60)
Glucose, Bld: 117 mg/dL — ABNORMAL HIGH (ref 65–99)
Potassium: 4.3 mmol/L (ref 3.5–5.1)
SODIUM: 135 mmol/L (ref 135–145)

## 2017-01-01 MED ORDER — APIXABAN 2.5 MG PO TABS: 2.5000 mg | ORAL_TABLET | Freq: Two times a day (BID) | ORAL | 0 refills | Status: DC

## 2017-01-01 MED ORDER — HYDROMORPHONE HCL 4 MG PO TABS: 4.0000 mg | ORAL_TABLET | Freq: Four times a day (QID) | ORAL | 0 refills | Status: AC | PRN

## 2017-01-01 MED ORDER — CYCLOBENZAPRINE HCL 10 MG PO TABS: 10.0000 mg | ORAL_TABLET | Freq: Three times a day (TID) | ORAL | 0 refills | Status: AC | PRN

## 2017-01-01 MED ORDER — CEPHALEXIN 500 MG PO CAPS: 500.0000 mg | ORAL_CAPSULE | Freq: Four times a day (QID) | ORAL | 0 refills | Status: AC

## 2017-01-01 NOTE — Progress Notes (Signed)
Pt being discharged to [facility] via transport. Attempted to call report x2. Pt alert and oriented x4. VSS. Pt c/o no pain at this time. No signs of respiratory distress. Education complete and care plans resolved. IVx3 removed with catheter intact and pt tolerated well. No further issues at this time. Pt to follow up with PCP. Leanne Chang, RN

## 2017-01-01 NOTE — Progress Notes (Signed)
OT Cancellation Note  Patient Details Name: Ann Silva MRN: [id number] DOB: [birth year]   Cancelled Treatment:    Reason Eval/Treat Not Completed: OT screened, no needs identified, will sign off.  Pt for transfer to SNF for rehab this pm.  She does not need OT eval for pre authorization.  OT will defer eval to SNF OT.  Tramain Gershman Brandt, OTR/L [phone]   Lucille Passy M 01/01/2017, 1:37 PM

## 2017-01-01 NOTE — Progress Notes (Signed)
Patient ID: Ann Silva, female   DOB: [birth year], 71 y.o.   MRN: [id number] Subjective:  The patient is alert and pleasant. She looks well. Her husband is at the bedside. She is looking forward to going home.  Objective: Vital signs in last 24 hours: Temp:  [98.1 F (36.7 C)-99.3 F (37.4 C)] 98.1 F (36.7 C) (06/27 1213) Pulse Rate:  [68-83] 70 (06/27 1213) Resp:  [15-23] 22 (06/27 1213) BP: (101-132)/(50-91) 110/50 (06/27 1213) SpO2:  [91 %-96 %] 95 % (06/27 1213) Weight:  [82.8 kg (182 lb 8 oz)] 82.8 kg (182 lb 8 oz) (06/27 0500)  Intake/Output from previous day: 06/26 0701 - 06/27 0700 In: 240 [P.O.:240] Out: 1000 [Urine:1000] Intake/Output this shift: Total I/O In: 240 [P.O.:240] Out: -   Physical exam the patient is alert and pleasant. She is moving her lower extremities well. Lumbar dressing is clean and dry.  Lab Results:  Recent Labs  12/31/16 0310 01/01/17 0521  WBC 5.4 5.0  HGB 8.9* 9.7*  HCT 28.7* 31.1*  PLT 194 240   BMET  Recent Labs  12/31/16 0310 01/01/17 0521  NA 138 135  K 5.4* 4.3  CL 105 100*  CO2 27 26  GLUCOSE 110* 117*  BUN 10 10  CREATININE 0.66 0.86  CALCIUM 9.8 10.0    Studies/Results: No results found.  Assessment/Plan: Status post lumbar fusion: The patient is progressing well. I instructed her to continue her Keflex, daily dressing changes, and follow-up with pain in the office in 6 days. I answered all their questions. I appreciate the teaching services care of this patient's DVT/pulmonary embolisms.  LOS: 3 days     Orvan Papadakis D 01/01/2017, 1:27 PM

## 2017-01-01 NOTE — Progress Notes (Signed)
Physical Therapy Treatment Patient Details Name: Ann Silva MRN: [id number] DOB: Nov 23, 1945 Today's Date: 01/01/2017    History of Present Illness Pt adm with PE and DVT. Pt with L4-5 laminectomy and fusion on 12/25/16. PMH - CAD, COPD, HTN, THR    PT Comments    Patient progressing well towards PT goals. Requires less assist with bed mobility, transfers and gait training today but still demonstrates balance deficits. Requires cues to maintain precautions during mobility. Only able to recall 1/3 back precautions at beginning of session. Eager to get to SNF so pt can maximize independence and return to PLOF. Will follow.   Follow Up Recommendations  SNF     Equipment Recommendations  None recommended by PT    Recommendations for Other Services       Precautions / Restrictions Precautions Precautions: Fall;Back Precaution Booklet Issued: No Precaution Comments: Reviewed back precautions as pt only able to recall 1/3. Required Braces or Orthoses: Spinal Brace Spinal Brace: Applied in sitting position Restrictions Weight Bearing Restrictions: No    Mobility  Bed Mobility Overal bed mobility: Needs Assistance Bed Mobility: Rolling;Sidelying to Sit Rolling: Min guard Sidelying to sit: Min guard;HOB elevated       General bed mobility comments: Cues for log roll technique, use of rail for support. no assist needed. Increased time.  Transfers Overall transfer level: Needs assistance Equipment used: Rolling walker (2 wheeled) Transfers: Sit to/from Stand Sit to Stand: Min guard         General transfer comment: Min guard for safety to stand from EOB x1, from low toilet x1. transferred to chair post ambulation.  Ambulation/Gait Ambulation/Gait assistance: Min assist Ambulation Distance (Feet): 125 Feet Assistive device: Rolling walker (2 wheeled) Gait Pattern/deviations: Step-through pattern;Decreased step length - right;Decreased step length - left;Decreased  stride length;Drifts right/left Gait velocity: decreased Gait velocity interpretation: Below normal speed for age/gender General Gait Details: Min A for balance due to unsteady gait. 2/4 DOE.   Stairs            Wheelchair Mobility    Modified Rankin (Stroke Patients Only)       Balance Overall balance assessment: Needs assistance Sitting-balance support: Feet supported;No upper extremity supported Sitting balance-Leahy Scale: Fair Sitting balance - Comments: Able to perform pericare without assist. Able to donn/doff brace with setup.   Standing balance support: During functional activity;Bilateral upper extremity supported Standing balance-Leahy Scale: Poor Standing balance comment: Reliant on BUEs for support in standing using RW.                             Cognition Arousal/Alertness: Awake/alert Behavior During Therapy: WFL for tasks assessed/performed Overall Cognitive Status: Within Functional Limits for tasks assessed                                        Exercises      General Comments General comments (skin integrity, edema, etc.): Spouse present during session.      Pertinent Vitals/Pain Pain Assessment: 0-10 Pain Score: 5  Pain Location: back Pain Descriptors / Indicators: Grimacing;Sore Pain Intervention(s): Monitored during session;Patient requesting pain meds-RN notified;Repositioned;Limited activity within patient's tolerance    Home Living                      Prior Function  PT Goals (current goals can now be found in the care plan section) Progress towards PT goals: Progressing toward goals    Frequency    Min 3X/week      PT Plan Current plan remains appropriate    Co-evaluation              AM-PAC PT "6 Clicks" Daily Activity  Outcome Measure  Difficulty turning over in bed (including adjusting bedclothes, sheets and blankets)?: None Difficulty moving from lying on  back to sitting on the side of the bed? : None Difficulty sitting down on and standing up from a chair with arms (e.g., wheelchair, bedside commode, etc,.)?: None Help needed moving to and from a bed to chair (including a wheelchair)?: A Little Help needed walking in hospital room?: A Little Help needed climbing 3-5 steps with a railing? : A Lot 6 Click Score: 20    End of Session Equipment Utilized During Treatment: Gait belt;Back brace Activity Tolerance: Patient limited by fatigue;Patient limited by pain Patient left: in chair;with call bell/phone within reach;with family/visitor present;with nursing/sitter in room Nurse Communication: Mobility status PT Visit Diagnosis: Unsteadiness on feet (R26.81);Muscle weakness (generalized) (M62.81);Difficulty in walking, not elsewhere classified (R26.2);Pain Pain - part of body:  (back)     Time: 5883-2549 PT Time Calculation (min) (ACUTE ONLY): 24 min  Charges:  $Gait Training: 8-22 mins $Therapeutic Activity: 8-22 mins                    G Codes:       Wray Kearns, PT, DPT [phone]     Marguarite Arbour A [place] 01/01/2017, 12:05 PM

## 2017-01-01 NOTE — Consult Note (Signed)
   [facility] CM Inpatient Consult   01/01/2017  Ann Silva 29-Nov-1945 [id number]      Patient screened for potential [facility] Care Management services. Chart reviewed. Noted current discharge plan is for SNF.  There are no identifiable [facility] Care Management needs at this time. If patient's post hospital needs change, please place a [facility] Care Management consult. For questions please contact:  Marthenia Rolling, [place], RN,BSN [facility] Liaison [phone]

## 2017-01-01 NOTE — Progress Notes (Signed)
Patient will discharge to [facility] Anticipated discharge date: 6/27 Family notified: husband at bedside Transportation by [facility]- scheduled for 2:15pm  CSW signing off.  Jorge Ny, LCSW Clinical Social Worker [phone]

## 2017-01-01 NOTE — Care Management Important Message (Signed)
Important Message  Patient Details  Name: Ann Silva MRN: [id number] Date of Birth: 1946/05/11   Medicare Important Message Given:  Yes    Erenest Rasher, RN 01/01/2017, 12:32 PM

## 2017-01-02 DIAGNOSIS — I2699 Other pulmonary embolism without acute cor pulmonale: Secondary | ICD-10-CM | POA: Diagnosis not present

## 2017-01-02 DIAGNOSIS — J449 Chronic obstructive pulmonary disease, unspecified: Secondary | ICD-10-CM | POA: Diagnosis not present

## 2017-01-02 DIAGNOSIS — L089 Local infection of the skin and subcutaneous tissue, unspecified: Secondary | ICD-10-CM | POA: Diagnosis not present

## 2017-01-02 DIAGNOSIS — R531 Weakness: Secondary | ICD-10-CM | POA: Diagnosis not present

## 2017-01-02 LAB — CULTURE, BLOOD (ROUTINE X 2)
CULTURE: NO GROWTH
Culture: NO GROWTH
Special Requests: ADEQUATE
Special Requests: ADEQUATE

## 2017-01-03 DIAGNOSIS — R531 Weakness: Secondary | ICD-10-CM | POA: Diagnosis not present

## 2017-01-03 DIAGNOSIS — I10 Essential (primary) hypertension: Secondary | ICD-10-CM | POA: Diagnosis not present

## 2017-01-03 DIAGNOSIS — I2699 Other pulmonary embolism without acute cor pulmonale: Secondary | ICD-10-CM | POA: Diagnosis not present

## 2017-01-03 DIAGNOSIS — J449 Chronic obstructive pulmonary disease, unspecified: Secondary | ICD-10-CM | POA: Diagnosis not present

## 2017-01-16 DIAGNOSIS — M4316 Spondylolisthesis, lumbar region: Secondary | ICD-10-CM | POA: Diagnosis not present

## 2017-01-16 DIAGNOSIS — Z4789 Encounter for other orthopedic aftercare: Secondary | ICD-10-CM | POA: Diagnosis not present

## 2017-01-16 DIAGNOSIS — G8929 Other chronic pain: Secondary | ICD-10-CM | POA: Diagnosis not present

## 2017-01-16 DIAGNOSIS — M5116 Intervertebral disc disorders with radiculopathy, lumbar region: Secondary | ICD-10-CM | POA: Diagnosis not present

## 2017-01-16 DIAGNOSIS — M48062 Spinal stenosis, lumbar region with neurogenic claudication: Secondary | ICD-10-CM | POA: Diagnosis not present

## 2017-01-16 DIAGNOSIS — J449 Chronic obstructive pulmonary disease, unspecified: Secondary | ICD-10-CM | POA: Diagnosis not present

## 2017-01-21 DIAGNOSIS — G8929 Other chronic pain: Secondary | ICD-10-CM | POA: Diagnosis not present

## 2017-01-21 DIAGNOSIS — M5116 Intervertebral disc disorders with radiculopathy, lumbar region: Secondary | ICD-10-CM | POA: Diagnosis not present

## 2017-01-21 DIAGNOSIS — Z4789 Encounter for other orthopedic aftercare: Secondary | ICD-10-CM | POA: Diagnosis not present

## 2017-01-21 DIAGNOSIS — M48062 Spinal stenosis, lumbar region with neurogenic claudication: Secondary | ICD-10-CM | POA: Diagnosis not present

## 2017-01-21 DIAGNOSIS — M4316 Spondylolisthesis, lumbar region: Secondary | ICD-10-CM | POA: Diagnosis not present

## 2017-01-21 DIAGNOSIS — J449 Chronic obstructive pulmonary disease, unspecified: Secondary | ICD-10-CM | POA: Diagnosis not present

## 2017-01-22 DIAGNOSIS — G8929 Other chronic pain: Secondary | ICD-10-CM | POA: Diagnosis not present

## 2017-01-22 DIAGNOSIS — Z4789 Encounter for other orthopedic aftercare: Secondary | ICD-10-CM | POA: Diagnosis not present

## 2017-01-22 DIAGNOSIS — M4316 Spondylolisthesis, lumbar region: Secondary | ICD-10-CM | POA: Diagnosis not present

## 2017-01-22 DIAGNOSIS — J449 Chronic obstructive pulmonary disease, unspecified: Secondary | ICD-10-CM | POA: Diagnosis not present

## 2017-01-22 DIAGNOSIS — M48062 Spinal stenosis, lumbar region with neurogenic claudication: Secondary | ICD-10-CM | POA: Diagnosis not present

## 2017-01-22 DIAGNOSIS — M5116 Intervertebral disc disorders with radiculopathy, lumbar region: Secondary | ICD-10-CM | POA: Diagnosis not present

## 2017-01-23 DIAGNOSIS — Z4789 Encounter for other orthopedic aftercare: Secondary | ICD-10-CM | POA: Diagnosis not present

## 2017-01-23 DIAGNOSIS — G8929 Other chronic pain: Secondary | ICD-10-CM | POA: Diagnosis not present

## 2017-01-23 DIAGNOSIS — J449 Chronic obstructive pulmonary disease, unspecified: Secondary | ICD-10-CM | POA: Diagnosis not present

## 2017-01-23 DIAGNOSIS — M48062 Spinal stenosis, lumbar region with neurogenic claudication: Secondary | ICD-10-CM | POA: Diagnosis not present

## 2017-01-23 DIAGNOSIS — M4316 Spondylolisthesis, lumbar region: Secondary | ICD-10-CM | POA: Diagnosis not present

## 2017-01-23 DIAGNOSIS — M5116 Intervertebral disc disorders with radiculopathy, lumbar region: Secondary | ICD-10-CM | POA: Diagnosis not present

## 2017-01-24 DIAGNOSIS — G8929 Other chronic pain: Secondary | ICD-10-CM | POA: Diagnosis not present

## 2017-01-24 DIAGNOSIS — M48062 Spinal stenosis, lumbar region with neurogenic claudication: Secondary | ICD-10-CM | POA: Diagnosis not present

## 2017-01-24 DIAGNOSIS — Z4789 Encounter for other orthopedic aftercare: Secondary | ICD-10-CM | POA: Diagnosis not present

## 2017-01-24 DIAGNOSIS — M4316 Spondylolisthesis, lumbar region: Secondary | ICD-10-CM | POA: Diagnosis not present

## 2017-01-24 DIAGNOSIS — M5116 Intervertebral disc disorders with radiculopathy, lumbar region: Secondary | ICD-10-CM | POA: Diagnosis not present

## 2017-01-24 DIAGNOSIS — J449 Chronic obstructive pulmonary disease, unspecified: Secondary | ICD-10-CM | POA: Diagnosis not present

## 2017-01-28 DIAGNOSIS — G8929 Other chronic pain: Secondary | ICD-10-CM | POA: Diagnosis not present

## 2017-01-28 DIAGNOSIS — J449 Chronic obstructive pulmonary disease, unspecified: Secondary | ICD-10-CM | POA: Diagnosis not present

## 2017-01-28 DIAGNOSIS — M5116 Intervertebral disc disorders with radiculopathy, lumbar region: Secondary | ICD-10-CM | POA: Diagnosis not present

## 2017-01-28 DIAGNOSIS — M4316 Spondylolisthesis, lumbar region: Secondary | ICD-10-CM | POA: Diagnosis not present

## 2017-01-28 DIAGNOSIS — Z4789 Encounter for other orthopedic aftercare: Secondary | ICD-10-CM | POA: Diagnosis not present

## 2017-01-28 DIAGNOSIS — M48062 Spinal stenosis, lumbar region with neurogenic claudication: Secondary | ICD-10-CM | POA: Diagnosis not present

## 2017-01-30 DIAGNOSIS — M4316 Spondylolisthesis, lumbar region: Secondary | ICD-10-CM | POA: Diagnosis not present

## 2017-01-30 DIAGNOSIS — M48062 Spinal stenosis, lumbar region with neurogenic claudication: Secondary | ICD-10-CM | POA: Diagnosis not present

## 2017-01-30 DIAGNOSIS — G8929 Other chronic pain: Secondary | ICD-10-CM | POA: Diagnosis not present

## 2017-01-30 DIAGNOSIS — J449 Chronic obstructive pulmonary disease, unspecified: Secondary | ICD-10-CM | POA: Diagnosis not present

## 2017-01-30 DIAGNOSIS — Z4789 Encounter for other orthopedic aftercare: Secondary | ICD-10-CM | POA: Diagnosis not present

## 2017-01-30 DIAGNOSIS — M5116 Intervertebral disc disorders with radiculopathy, lumbar region: Secondary | ICD-10-CM | POA: Diagnosis not present

## 2017-01-31 DIAGNOSIS — M48062 Spinal stenosis, lumbar region with neurogenic claudication: Secondary | ICD-10-CM | POA: Diagnosis not present

## 2017-01-31 DIAGNOSIS — I10 Essential (primary) hypertension: Secondary | ICD-10-CM | POA: Diagnosis not present

## 2017-01-31 DIAGNOSIS — Z6834 Body mass index (BMI) 34.0-34.9, adult: Secondary | ICD-10-CM | POA: Diagnosis not present

## 2017-01-31 DIAGNOSIS — M4316 Spondylolisthesis, lumbar region: Secondary | ICD-10-CM | POA: Diagnosis not present

## 2017-02-01 ENCOUNTER — Emergency Department: Payer: Medicare Other

## 2017-02-01 ENCOUNTER — Encounter: Payer: Self-pay | Admitting: Emergency Medicine

## 2017-02-01 ENCOUNTER — Emergency Department
Admission: EM | Admit: 2017-02-01 | Discharge: 2017-02-01 | Disposition: A | Discharge status: Home / Self Care | Payer: Medicare Other | Attending: Emergency Medicine | Admitting: Emergency Medicine

## 2017-02-01 DIAGNOSIS — Z7901 Long term (current) use of anticoagulants: Secondary | ICD-10-CM | POA: Insufficient documentation

## 2017-02-01 DIAGNOSIS — Z85038 Personal history of other malignant neoplasm of large intestine: Secondary | ICD-10-CM | POA: Insufficient documentation

## 2017-02-01 DIAGNOSIS — J449 Chronic obstructive pulmonary disease, unspecified: Secondary | ICD-10-CM | POA: Insufficient documentation

## 2017-02-01 DIAGNOSIS — Z96642 Presence of left artificial hip joint: Secondary | ICD-10-CM | POA: Diagnosis not present

## 2017-02-01 DIAGNOSIS — R0600 Dyspnea, unspecified: Secondary | ICD-10-CM

## 2017-02-01 DIAGNOSIS — R069 Unspecified abnormalities of breathing: Secondary | ICD-10-CM | POA: Diagnosis not present

## 2017-02-01 DIAGNOSIS — Z86711 Personal history of pulmonary embolism: Secondary | ICD-10-CM | POA: Insufficient documentation

## 2017-02-01 DIAGNOSIS — I259 Chronic ischemic heart disease, unspecified: Secondary | ICD-10-CM | POA: Insufficient documentation

## 2017-02-01 DIAGNOSIS — Z87891 Personal history of nicotine dependence: Secondary | ICD-10-CM | POA: Diagnosis not present

## 2017-02-01 DIAGNOSIS — Z79899 Other long term (current) drug therapy: Secondary | ICD-10-CM | POA: Diagnosis not present

## 2017-02-01 DIAGNOSIS — I2699 Other pulmonary embolism without acute cor pulmonale: Secondary | ICD-10-CM | POA: Diagnosis not present

## 2017-02-01 DIAGNOSIS — R0602 Shortness of breath: Secondary | ICD-10-CM | POA: Diagnosis not present

## 2017-02-01 LAB — TROPONIN I: Troponin I: <0.03 ng/mL (ref <0.03)

## 2017-02-01 LAB — COMPREHENSIVE METABOLIC PANEL
ALBUMIN: 3.5 g/dL (ref 3.5–5.0)
ALT: 13 U/L — ABNORMAL LOW (ref 14–54)
AST: 22 U/L (ref 15–41)
Alkaline Phosphatase: 155 U/L — ABNORMAL HIGH (ref 38–126)
Anion gap: 9 (ref 5–15)
BILIRUBIN TOTAL: 0.4 mg/dL (ref 0.3–1.2)
BUN: 19 mg/dL (ref 6–20)
CO2: 31 mmol/L (ref 22–32)
Calcium: 10.4 mg/dL — ABNORMAL HIGH (ref 8.9–10.3)
Chloride: 101 mmol/L (ref 101–111)
Creatinine, Ser: 0.94 mg/dL (ref 0.44–1.00)
GFR calc Af Amer: >60 mL/min (ref >60)
GFR calc non Af Amer: 59 mL/min — ABNORMAL LOW (ref >60)
GLUCOSE: 97 mg/dL (ref 65–99)
POTASSIUM: 3.9 mmol/L (ref 3.5–5.1)
SODIUM: 141 mmol/L (ref 135–145)
TOTAL PROTEIN: 7.2 g/dL (ref 6.5–8.1)

## 2017-02-01 LAB — CBC
HCT: 35.1 % (ref 35.0–47.0)
Hemoglobin: 11.6 g/dL — ABNORMAL LOW (ref 12.0–16.0)
MCH: 29.5 pg (ref 26.0–34.0)
MCHC: 33 g/dL (ref 32.0–36.0)
MCV: 89.4 fL (ref 80.0–100.0)
PLATELETS: 218 10*3/uL (ref 150–440)
RBC: 3.92 MIL/uL (ref 3.80–5.20)
RDW: 15.2 % — AB (ref 11.5–14.5)
WBC: 5.4 10*3/uL (ref 3.6–11.0)

## 2017-02-01 MED ORDER — IOPAMIDOL (ISOVUE-370) INJECTION 76%
75.0000 mL | Freq: Once | INTRAVENOUS | Status: AC | PRN
  Administered 2017-02-01: 75 mL via INTRAVENOUS

## 2017-02-01 MED ORDER — ALPRAZOLAM 0.5 MG PO TABS: 0.5000 mg | ORAL_TABLET | Freq: Three times a day (TID) | ORAL | 0 refills | Status: AC | PRN

## 2017-02-01 NOTE — ED Provider Notes (Signed)
[facility] Emergency Department Provider Note  Time seen: 4:29 PM  I have reviewed the triage vital signs and the nursing notes.   HISTORY  Chief Complaint Respiratory Distress    HPI Ann Silva is a 71 y.o. female with a significant past medical history including pulmonary embolus following a surgery in June 2018 currently on eliquis who presents to the emergency department for a feeling that she is going to die. She states of the past 2 days she has been having somewhat increased shortness of breath. She used her home pulse oximeter which read 86%. Patient states in June she was on oxygen in the hospital but her oxygen level improved and she was able to be discharged without oxygen. She denies any increased cough or congestion. Denies any pleuritic chest pain. Denies any chest pain at all. Patient states she kept having the feeling that if she falls asleep she is going to die because of her trouble breathing. Patient states she has not missed any doses of her blood thinner. Denies any leg pain or swelling.   Past Medical History:  Diagnosis Date  . Anemia    takes ferrous sulfate daily  . Anxiety   . Arthritis   . Bladder spasm   . Chronic back pain    spinal stenosis  . Colon cancer ([place])   . Complication of anesthesia    B/P bottoms out, hard to wake up   . Contact dermatitis   . COPD (chronic obstructive pulmonary disease) (HCC)    Albuterol inhaler and DuoNeb as needed  . Coronary artery disease    mild by 08/25/09 cath  . Headache   . Heart murmur    never had any problems  . History of blood transfusion    no abnormal reaction noted  . History of bronchitis   . History of MRSA infection 6+ yrs ago  . Hypertension    takes Metoprolol daily  . Insomnia    takes Trazodone nightly  . Joint pain   . Joint swelling   . Nocturia   . Panic attacks    takes Effexor daily  . Peripheral edema    takes Lasix daily and takes Bumex every 3  days.Takes Aldactone daily   . Restless leg syndrome    takes Requip daily  . RLS (restless legs syndrome)   . Sleep apnea    doesnt use CPAP    Patient Active Problem List   Diagnosis Date Noted  . Dyspnea 12/29/2016  . Acute congestive heart failure ([place])   . HCAP (healthcare-associated pneumonia)   . Pulmonary embolus ([place])   . Lumbar adjacent segment disease with spondylolisthesis 12/25/2016  . COPD with acute exacerbation ([place]) 11/27/2015  . COPD exacerbation ([place]) 08/21/2015  . Restless legs 02/27/2015  . Hypertension   . Colon cancer ([place])   . Depression   . History of repair of hip joint 10/25/2013    Past Surgical History:  Procedure Laterality Date  . ABDOMINAL HYSTERECTOMY    . CHOLECYSTECTOMY    . COLON SURGERY     partial colectomy d/t cancer cells  . COLONOSCOPY    . I&D of left hip      x 4  . JOINT REPLACEMENT    . Resection total hip    . Revision of total hip arthroplasty     x3-4  . right hip replacement    . TOTAL HIP ARTHROPLASTY Left     Prior to Admission  medications   Medication Sig Start Date End Date Taking? Authorizing Provider  acetaminophen (TYLENOL) 500 MG tablet Take 1,000 mg by mouth every 6 (six) hours as needed (for pain/back pain.).    [provider]  albuterol (PROVENTIL HFA;VENTOLIN HFA) 108 (90 Base) MCG/ACT inhaler Inhale 2 puffs into the lungs every 4 (four) hours as needed for wheezing or shortness of breath. 12/05/15   Loletha Grayer, MD  apixaban (ELIQUIS) 2.5 MG TABS tablet Take 1 tablet (2.5 mg total) by mouth 2 (two) times daily. 01/01/17   Diallo, Earna Coder, MD  budesonide-formoterol (SYMBICORT) 160-4.5 MCG/ACT inhaler Inhale 2 puffs into the lungs 2 (two) times daily. Patient taking differently: Inhale 2 puffs into the lungs 2 (two) times daily as needed (for respiratory issues).  12/05/15   Loletha Grayer, MD  bumetanide (BUMEX) 2 MG tablet Take 2 mg by mouth every 3 (three) days. 05/09/16   [provider]  Calcium Carbonate-Vit D-Min (CALTRATE 600+D PLUS PO) Take 1 tablet by mouth 2 (two) times daily.    [provider]  Cyanocobalamin (VITAMIN B-12) 2500 MCG SUBL Place 2,500 mcg under the tongue at bedtime.    [provider]  cyclobenzaprine (FLEXERIL) 10 MG tablet Take 1 tablet (10 mg total) by mouth 3 (three) times daily as needed for muscle spasms. 12/26/16   Newman Pies, MD  docusate sodium (COLACE) 100 MG capsule Take 1 capsule (100 mg total) by mouth 2 (two) times daily. Patient not taking: Reported on 12/29/2016 12/26/16   Newman Pies, MD  Emollient (CERAVE) CREA Apply 1 application topically 2 (two) times daily. Applied neck down for dermatitis    [provider]  fluticasone (FLONASE) 50 MCG/ACT nasal spray Place 1 spray into both nostrils daily as needed (for sinus issues).     [provider]  furosemide (LASIX) 20 MG tablet Take 1 tablet (20 mg total) by mouth daily. Patient taking differently: Take 20 mg by mouth 2 (two) times daily.  04/29/16 04/29/17  Lavonia Drafts, MD  HYDROmorphone (DILAUDID) 4 MG tablet Take 1 tablet (4 mg total) by mouth every 6 (six) hours as needed for severe pain. 12/26/16   Newman Pies, MD  ipratropium-albuterol (DUONEB) 0.5-2.5 (3) MG/3ML SOLN Take 3 mLs by nebulization every 6 (six) hours as needed (for wheezing/shortness of breath).    [provider]  Iron-Vitamin C (VITRON-C PO) Take 1 tablet by mouth at bedtime.    [provider]  metoprolol succinate (TOPROL-XL) 50 MG 24 hr tablet Take 1 tablet (50 mg total) by mouth every evening. Take with or immediately following a meal. Patient taking differently: Take 50 mg by mouth at bedtime. Take with or immediately following a meal. 12/26/15   Crissman, Jeannette How, MD  mometasone (ELOCON) 0.1 % cream Apply 1 application topically 2 (two) times daily as needed (for dermatitis). For dermatitis 04/24/16   [provider]   mupirocin ointment (BACTROBAN) 2 % Apply 1 application topically 2 (two) times daily as needed. For skin infection. 10/03/16   [provider]  ondansetron (ZOFRAN ODT) 4 MG disintegrating tablet Take 1 tablet (4 mg total) by mouth every 8 (eight) hours as needed for nausea or vomiting. Patient taking differently: Take 4 mg by mouth every 8 (eight) hours as needed for nausea or vomiting. DISSOLVE IN THE MOUTH 01/10/16   Volney American, PA-C  rOPINIRole (REQUIP) 3 MG tablet Take 1 tablet (3 mg total) by mouth 4 (four) times daily. 04/20/15  [name], MD  sertraline (ZOLOFT) 100 MG tablet Take 100 mg by mouth at bedtime.     [provider]  spironolactone (ALDACTONE) 25 MG tablet Take 25 mg by mouth at bedtime.  05/24/16   [provider]    Allergies  Allergen Reactions  . Hydrocodone-Chlorpheniramine Shortness Of Breath  . Prednisone Shortness Of Breath  . Sulfa Antibiotics Shortness Of Breath  . Chlorhexidine Rash  . Methadone Other (See Comments)    Altered Mental Status  . Benadryl [Diphenhydramine] Anxiety  . Codeine Anxiety  . Diphenhydramine-Zinc Acetate Anxiety  . Latex Rash  . Tape Rash    Family History  Problem Relation Age of Onset  . Cancer Mother   . Stroke Father   . Diabetes Sister   . Mental illness Sister     Social History Social History  Substance Use Topics  . Smoking status: Former Smoker    Packs/day: 0.75    Years: 51.00    Types: Cigarettes    Start date: 07/08/1964  . Smokeless tobacco: Never Used     Comment: quit 2 weeks ago  . Alcohol use No    Review of Systems Constitutional: Negative for fever. Cardiovascular: Negative for chest pain. Respiratory: Positive for shortness of breath at times. Gastrointestinal: Negative for abdominal pain, vomiting  Musculoskeletal: Negative for leg pain or swelling. Skin: Negative for rash. All other ROS  negative  ____________________________________________   PHYSICAL EXAM:  VITAL SIGNS: ED Triage Vitals  Enc Vitals Group     BP 02/01/17 1554 (!) 101/48     Pulse Rate 02/01/17 1554 80     Resp 02/01/17 1554 18     Temp --      Temp src --      SpO2 02/01/17 1554 98 %     Weight 02/01/17 1555 184 lb (83.5 kg)     Height 02/01/17 1555 5\' 1"  (1.549 m)     Head Circumference --      Peak Flow --      Pain Score --      Pain Loc --      Pain Edu? --      Excl. in [place]? --     Constitutional: Alert and oriented. Well appearing and in no distress. Eyes: Normal exam ENT   Head: Normocephalic and atraumatic   Mouth/Throat: Mucous membranes are moist. Cardiovascular: Normal rate, regular rhythm. No murmur Respiratory: Normal respiratory effort without tachypnea nor retractions. Breath sounds are clear  Gastrointestinal: Soft and nontender. No distention.  Musculoskeletal: Nontender with normal range of motion in all extremities. No lower extremity tenderness or edema. Neurologic:  Normal speech and language. No gross focal neurologic deficits  Skin:  Skin is warm, dry and intact.  Psychiatric: Mood and affect are normal.   ____________________________________________    EKG  EKG reviewed and interpreted by myself shows normal sinus rhythm at 84 bpm, narrow QRS, normal axis, normal intervals, nonspecific but no concerning ST changes.  ____________________________________________    RADIOLOGY  IMPRESSION: 1. No pulmonary embolus or other acute thoracic abnormality. 2. Improved aeration of the left upper lobe. Persistent right upper lobe opacities, possibly atelectasis. 3. Aortic Atherosclerosis (ICD10-I70.0).  ____________________________________________   INITIAL IMPRESSION / ASSESSMENT AND PLAN / ED COURSE  Pertinent labs & imaging results that were available during my care of the patient were reviewed by me and considered in my medical decision making (see  chart for details).  Patient presents to the emergency  department with a feeling that she is going to die with increased shortness of breath. Patient has a history of pulmonary embolus in June 2018 currently on blood thinner. Patient is mildly hypoxic between 89 and 90% on room air with a good waveform. 98% on 2 L. Given the patient's mild hypoxia with a history of PE we will obtain labs including cardiac enzymes, EKG and obtain a CT angiography of the chest. Patient is agreeable to this plan. Currently during my evaluation the patient is calm, cooperative, no distress. She really has no complaints besides mild increased shortness of breath and a feeling like something bad is going to happen.  Patient appears very well. Negative workup in the emergency department. I discussed the possibility of anxiety. CT angiography is negative for PE. Patient satting 95% on room air. We'll discharge with a very short course of Xanax the patient has follow-up with her doctor on Monday. I discussed very strict return precautions.  ____________________________________________   FINAL CLINICAL IMPRESSION(S) / ED DIAGNOSES  Dyspnea    Harvest Dark, MD 02/01/17 [phone]

## 2017-02-01 NOTE — ED Triage Notes (Signed)
Pt states everytime she tried to fall asleep today she was afraid she was going to die. Given a breathing tx by ems

## 2017-02-03 DIAGNOSIS — J449 Chronic obstructive pulmonary disease, unspecified: Secondary | ICD-10-CM | POA: Diagnosis not present

## 2017-02-04 DIAGNOSIS — M48062 Spinal stenosis, lumbar region with neurogenic claudication: Secondary | ICD-10-CM | POA: Diagnosis not present

## 2017-02-04 DIAGNOSIS — J449 Chronic obstructive pulmonary disease, unspecified: Secondary | ICD-10-CM | POA: Diagnosis not present

## 2017-02-04 DIAGNOSIS — Z4789 Encounter for other orthopedic aftercare: Secondary | ICD-10-CM | POA: Diagnosis not present

## 2017-02-04 DIAGNOSIS — G8929 Other chronic pain: Secondary | ICD-10-CM | POA: Diagnosis not present

## 2017-02-04 DIAGNOSIS — M5116 Intervertebral disc disorders with radiculopathy, lumbar region: Secondary | ICD-10-CM | POA: Diagnosis not present

## 2017-02-04 DIAGNOSIS — M4316 Spondylolisthesis, lumbar region: Secondary | ICD-10-CM | POA: Diagnosis not present

## 2017-02-06 DIAGNOSIS — G8929 Other chronic pain: Secondary | ICD-10-CM | POA: Diagnosis not present

## 2017-02-06 DIAGNOSIS — Z4789 Encounter for other orthopedic aftercare: Secondary | ICD-10-CM | POA: Diagnosis not present

## 2017-02-06 DIAGNOSIS — J449 Chronic obstructive pulmonary disease, unspecified: Secondary | ICD-10-CM | POA: Diagnosis not present

## 2017-02-06 DIAGNOSIS — M4316 Spondylolisthesis, lumbar region: Secondary | ICD-10-CM | POA: Diagnosis not present

## 2017-02-06 DIAGNOSIS — M5116 Intervertebral disc disorders with radiculopathy, lumbar region: Secondary | ICD-10-CM | POA: Diagnosis not present

## 2017-02-06 DIAGNOSIS — M48062 Spinal stenosis, lumbar region with neurogenic claudication: Secondary | ICD-10-CM | POA: Diagnosis not present

## 2017-02-07 DIAGNOSIS — R0602 Shortness of breath: Secondary | ICD-10-CM | POA: Diagnosis not present

## 2017-02-12 DIAGNOSIS — M48062 Spinal stenosis, lumbar region with neurogenic claudication: Secondary | ICD-10-CM | POA: Diagnosis not present

## 2017-02-12 DIAGNOSIS — Z4789 Encounter for other orthopedic aftercare: Secondary | ICD-10-CM | POA: Diagnosis not present

## 2017-02-12 DIAGNOSIS — G8929 Other chronic pain: Secondary | ICD-10-CM | POA: Diagnosis not present

## 2017-02-12 DIAGNOSIS — M4316 Spondylolisthesis, lumbar region: Secondary | ICD-10-CM | POA: Diagnosis not present

## 2017-02-12 DIAGNOSIS — M5116 Intervertebral disc disorders with radiculopathy, lumbar region: Secondary | ICD-10-CM | POA: Diagnosis not present

## 2017-02-12 DIAGNOSIS — J449 Chronic obstructive pulmonary disease, unspecified: Secondary | ICD-10-CM | POA: Diagnosis not present

## 2017-02-14 DIAGNOSIS — J449 Chronic obstructive pulmonary disease, unspecified: Secondary | ICD-10-CM | POA: Diagnosis not present

## 2017-02-14 DIAGNOSIS — M5116 Intervertebral disc disorders with radiculopathy, lumbar region: Secondary | ICD-10-CM | POA: Diagnosis not present

## 2017-02-14 DIAGNOSIS — G8929 Other chronic pain: Secondary | ICD-10-CM | POA: Diagnosis not present

## 2017-02-14 DIAGNOSIS — M4316 Spondylolisthesis, lumbar region: Secondary | ICD-10-CM | POA: Diagnosis not present

## 2017-02-14 DIAGNOSIS — M48062 Spinal stenosis, lumbar region with neurogenic claudication: Secondary | ICD-10-CM | POA: Diagnosis not present

## 2017-02-14 DIAGNOSIS — Z4789 Encounter for other orthopedic aftercare: Secondary | ICD-10-CM | POA: Diagnosis not present

## 2017-02-17 DIAGNOSIS — Z4789 Encounter for other orthopedic aftercare: Secondary | ICD-10-CM | POA: Diagnosis not present

## 2017-02-17 DIAGNOSIS — J449 Chronic obstructive pulmonary disease, unspecified: Secondary | ICD-10-CM | POA: Diagnosis not present

## 2017-02-17 DIAGNOSIS — M48062 Spinal stenosis, lumbar region with neurogenic claudication: Secondary | ICD-10-CM | POA: Diagnosis not present

## 2017-02-17 DIAGNOSIS — M5116 Intervertebral disc disorders with radiculopathy, lumbar region: Secondary | ICD-10-CM | POA: Diagnosis not present

## 2017-02-17 DIAGNOSIS — G8929 Other chronic pain: Secondary | ICD-10-CM | POA: Diagnosis not present

## 2017-02-17 DIAGNOSIS — M4316 Spondylolisthesis, lumbar region: Secondary | ICD-10-CM | POA: Diagnosis not present

## 2017-02-19 DIAGNOSIS — M4316 Spondylolisthesis, lumbar region: Secondary | ICD-10-CM | POA: Diagnosis not present

## 2017-02-19 DIAGNOSIS — M5116 Intervertebral disc disorders with radiculopathy, lumbar region: Secondary | ICD-10-CM | POA: Diagnosis not present

## 2017-02-19 DIAGNOSIS — M48062 Spinal stenosis, lumbar region with neurogenic claudication: Secondary | ICD-10-CM | POA: Diagnosis not present

## 2017-02-19 DIAGNOSIS — Z4789 Encounter for other orthopedic aftercare: Secondary | ICD-10-CM | POA: Diagnosis not present

## 2017-02-19 DIAGNOSIS — G8929 Other chronic pain: Secondary | ICD-10-CM | POA: Diagnosis not present

## 2017-02-19 DIAGNOSIS — J449 Chronic obstructive pulmonary disease, unspecified: Secondary | ICD-10-CM | POA: Diagnosis not present

## 2017-02-20 DIAGNOSIS — I2602 Saddle embolus of pulmonary artery with acute cor pulmonale: Secondary | ICD-10-CM | POA: Diagnosis not present

## 2017-02-20 DIAGNOSIS — M48061 Spinal stenosis, lumbar region without neurogenic claudication: Secondary | ICD-10-CM | POA: Diagnosis not present

## 2017-02-20 DIAGNOSIS — I5033 Acute on chronic diastolic (congestive) heart failure: Secondary | ICD-10-CM | POA: Diagnosis not present

## 2017-02-20 DIAGNOSIS — J449 Chronic obstructive pulmonary disease, unspecified: Secondary | ICD-10-CM | POA: Diagnosis not present

## 2017-03-03 ENCOUNTER — Emergency Department: Payer: Medicare Other

## 2017-03-03 ENCOUNTER — Encounter: Payer: Self-pay | Admitting: Emergency Medicine

## 2017-03-03 ENCOUNTER — Emergency Department
Admission: EM | Admit: 2017-03-03 | Discharge: 2017-03-03 | Disposition: A | Discharge status: Home / Self Care | Payer: Medicare Other | Attending: Emergency Medicine | Admitting: Emergency Medicine

## 2017-03-03 DIAGNOSIS — Z91040 Latex allergy status: Secondary | ICD-10-CM | POA: Insufficient documentation

## 2017-03-03 DIAGNOSIS — M5126 Other intervertebral disc displacement, lumbar region: Secondary | ICD-10-CM | POA: Diagnosis not present

## 2017-03-03 DIAGNOSIS — Y929 Unspecified place or not applicable: Secondary | ICD-10-CM | POA: Insufficient documentation

## 2017-03-03 DIAGNOSIS — M8448XA Pathological fracture, other site, initial encounter for fracture: Secondary | ICD-10-CM

## 2017-03-03 DIAGNOSIS — J449 Chronic obstructive pulmonary disease, unspecified: Secondary | ICD-10-CM | POA: Diagnosis not present

## 2017-03-03 DIAGNOSIS — Y9301 Activity, walking, marching and hiking: Secondary | ICD-10-CM | POA: Diagnosis not present

## 2017-03-03 DIAGNOSIS — I110 Hypertensive heart disease with heart failure: Secondary | ICD-10-CM | POA: Diagnosis not present

## 2017-03-03 DIAGNOSIS — S3992XA Unspecified injury of lower back, initial encounter: Secondary | ICD-10-CM | POA: Diagnosis present

## 2017-03-03 DIAGNOSIS — M545 Low back pain, unspecified: Secondary | ICD-10-CM

## 2017-03-03 DIAGNOSIS — I509 Heart failure, unspecified: Secondary | ICD-10-CM | POA: Insufficient documentation

## 2017-03-03 DIAGNOSIS — W0110XA Fall on same level from slipping, tripping and stumbling with subsequent striking against unspecified object, initial encounter: Secondary | ICD-10-CM | POA: Insufficient documentation

## 2017-03-03 DIAGNOSIS — I2510 Atherosclerotic heart disease of native coronary artery without angina pectoris: Secondary | ICD-10-CM | POA: Insufficient documentation

## 2017-03-03 DIAGNOSIS — Z87891 Personal history of nicotine dependence: Secondary | ICD-10-CM | POA: Insufficient documentation

## 2017-03-03 DIAGNOSIS — Y999 Unspecified external cause status: Secondary | ICD-10-CM | POA: Diagnosis not present

## 2017-03-03 DIAGNOSIS — Z79899 Other long term (current) drug therapy: Secondary | ICD-10-CM | POA: Diagnosis not present

## 2017-03-03 LAB — URINALYSIS, COMPLETE (UACMP) WITH MICROSCOPIC
Bacteria, UA: NONE SEEN
Bilirubin Urine: NEGATIVE
GLUCOSE, UA: NEGATIVE mg/dL
HGB URINE DIPSTICK: NEGATIVE
Ketones, ur: NEGATIVE mg/dL
Leukocytes, UA: NEGATIVE
NITRITE: NEGATIVE
PH: 5 (ref 5.0–8.0)
PROTEIN: NEGATIVE mg/dL
Specific Gravity, Urine: 1.012 (ref 1.005–1.030)

## 2017-03-03 LAB — CBC WITH DIFFERENTIAL/PLATELET
BASOS ABS: 0 10*3/uL (ref 0–0.1)
BASOS PCT: 0 %
EOS ABS: 0.4 10*3/uL (ref 0–0.7)
Eosinophils Relative: 4 %
HCT: 38.6 % (ref 35.0–47.0)
HEMOGLOBIN: 13.2 g/dL (ref 12.0–16.0)
LYMPHS PCT: 17 %
Lymphs Abs: 1.7 10*3/uL (ref 1.0–3.6)
MCH: 30.1 pg (ref 26.0–34.0)
MCHC: 34.3 g/dL (ref 32.0–36.0)
MCV: 87.8 fL (ref 80.0–100.0)
Monocytes Absolute: 0.7 10*3/uL (ref 0.2–0.9)
Monocytes Relative: 7 %
NEUTROS ABS: 7.1 10*3/uL — AB (ref 1.4–6.5)
Neutrophils Relative %: 72 %
PLATELETS: 248 10*3/uL (ref 150–440)
RBC: 4.39 MIL/uL (ref 3.80–5.20)
RDW: 16.3 % — ABNORMAL HIGH (ref 11.5–14.5)
WBC: 9.9 10*3/uL (ref 3.6–11.0)

## 2017-03-03 LAB — BASIC METABOLIC PANEL
Anion gap: 10 (ref 5–15)
BUN: 35 mg/dL — ABNORMAL HIGH (ref 6–20)
CHLORIDE: 101 mmol/L (ref 101–111)
CO2: 26 mmol/L (ref 22–32)
CREATININE: 1.24 mg/dL — AB (ref 0.44–1.00)
Calcium: 10.5 mg/dL — ABNORMAL HIGH (ref 8.9–10.3)
GFR, EST AFRICAN AMERICAN: 49 mL/min — AB (ref >60)
GFR, EST NON AFRICAN AMERICAN: 42 mL/min — AB (ref >60)
Glucose, Bld: 96 mg/dL (ref 65–99)
POTASSIUM: 4.5 mmol/L (ref 3.5–5.1)
SODIUM: 137 mmol/L (ref 135–145)

## 2017-03-03 MED ORDER — MORPHINE SULFATE (PF) 4 MG/ML IV SOLN
4.0000 mg | Freq: Once | INTRAVENOUS | Status: AC
  Administered 2017-03-03: 4 mg via INTRAVENOUS
  Filled 2017-03-03: qty 1

## 2017-03-03 MED ORDER — HYDROMORPHONE HCL 1 MG/ML IJ SOLN
0.5000 mg | Freq: Once | INTRAMUSCULAR | Status: AC
  Administered 2017-03-03: 0.5 mg via INTRAVENOUS
  Filled 2017-03-03: qty 1

## 2017-03-03 MED ORDER — OXYCODONE HCL 5 MG PO TABS: 5.0000 mg | ORAL_TABLET | Freq: Three times a day (TID) | ORAL | 0 refills | Status: DC | PRN

## 2017-03-03 MED ORDER — GADOBENATE DIMEGLUMINE 529 MG/ML IV SOLN
10.0000 mL | Freq: Once | INTRAVENOUS | Status: AC | PRN
  Administered 2017-03-03: 9 mL via INTRAVENOUS

## 2017-03-03 MED ORDER — ONDANSETRON HCL 4 MG/2ML IJ SOLN
4.0000 mg | Freq: Once | INTRAMUSCULAR | Status: AC
  Administered 2017-03-03: 4 mg via INTRAVENOUS
  Filled 2017-03-03: qty 2

## 2017-03-03 NOTE — ED Provider Notes (Signed)
[facility] Emergency Department Provider Note   ____________________________________________   First MD Initiated Contact with Patient 03/03/17 [phone]     (approximate)  I have reviewed the triage vital signs and the nursing notes.   HISTORY  Chief Complaint Back Pain and Post-op Problem    HPI Ann Silva is a 71 y.o. female who presents to the ED from home with a chief complaint of back pain. Patient had L4-5 decompression instrumentation and fusion on 6/20. Developed pulmonary embolism subsequently and was placed on Eliquis. Initially placed on Dilaudid for pain which was then switched to Percocet because Dilaudid was too strong. States she still has some Percocet but comes in for increasing lumbar back pain after mechanical fall 1 week ago when patient tripped over a hurdle and landed on her right side. Denies pain radiating to her legs, extremity weakness numbness/tingling, bowel or bladder incontinence. Denies recent fever, chills, chest pain, shortness of breath, abdominal pain, nausea, vomiting, dysuria. Nothing makes her pain better. Movement makes her pain worse. Naprosyn was helping her pain but since she has started Eliquis her doctor has advised her not to take it.   Past Medical History:  Diagnosis Date  . Anemia    takes ferrous sulfate daily  . Anxiety   . Arthritis   . Bladder spasm   . Chronic back pain    spinal stenosis  . Colon cancer ([place])   . Complication of anesthesia    B/P bottoms out, hard to wake up   . Contact dermatitis   . COPD (chronic obstructive pulmonary disease) (HCC)    Albuterol inhaler and DuoNeb as needed  . Coronary artery disease    mild by 08/25/09 cath  . Headache   . Heart murmur    never had any problems  . History of blood transfusion    no abnormal reaction noted  . History of bronchitis   . History of MRSA infection 6+ yrs ago  . Hypertension    takes Metoprolol daily  . Insomnia    takes  Trazodone nightly  . Joint pain   . Joint swelling   . Nocturia   . Panic attacks    takes Effexor daily  . Peripheral edema    takes Lasix daily and takes Bumex every 3 days.Takes Aldactone daily   . Restless leg syndrome    takes Requip daily  . RLS (restless legs syndrome)   . Sleep apnea    doesnt use CPAP    Patient Active Problem List   Diagnosis Date Noted  . Dyspnea 12/29/2016  . Acute congestive heart failure ([place])   . HCAP (healthcare-associated pneumonia)   . Pulmonary embolus ([place])   . Lumbar adjacent segment disease with spondylolisthesis 12/25/2016  . COPD with acute exacerbation ([place]) 11/27/2015  . COPD exacerbation ([place]) 08/21/2015  . Restless legs 02/27/2015  . Hypertension   . Colon cancer ([place])   . Depression   . History of repair of hip joint 10/25/2013    Past Surgical History:  Procedure Laterality Date  . ABDOMINAL HYSTERECTOMY    . CHOLECYSTECTOMY    . COLON SURGERY     partial colectomy d/t cancer cells  . COLONOSCOPY    . I&D of left hip      x 4  . JOINT REPLACEMENT    . Resection total hip    . Revision of total hip arthroplasty     x3-4  . right hip replacement    .  TOTAL HIP ARTHROPLASTY Left     Prior to Admission medications   Medication Sig Start Date End Date Taking? Authorizing Provider  acetaminophen (TYLENOL) 500 MG tablet Take 1,000 mg by mouth every 6 (six) hours as needed (for pain/back pain.).    [provider]  albuterol (PROVENTIL HFA;VENTOLIN HFA) 108 (90 Base) MCG/ACT inhaler Inhale 2 puffs into the lungs every 4 (four) hours as needed for wheezing or shortness of breath. 12/05/15   Loletha Grayer, MD  apixaban (ELIQUIS) 2.5 MG TABS tablet Take 1 tablet (2.5 mg total) by mouth 2 (two) times daily. 01/01/17   Diallo, Earna Coder, MD  budesonide-formoterol (SYMBICORT) 160-4.5 MCG/ACT inhaler Inhale 2 puffs into the lungs 2 (two) times daily. Patient taking differently: Inhale 2 puffs into the lungs 2 (two) times  daily as needed (for respiratory issues).  12/05/15   Loletha Grayer, MD  bumetanide (BUMEX) 2 MG tablet Take 2 mg by mouth every 3 (three) days. 05/09/16   [provider]  Calcium Carbonate-Vit D-Min (CALTRATE 600+D PLUS PO) Take 1 tablet by mouth 2 (two) times daily.    [provider]  Cyanocobalamin (VITAMIN B-12) 2500 MCG SUBL Place 2,500 mcg under the tongue at bedtime.    [provider]  cyclobenzaprine (FLEXERIL) 10 MG tablet Take 1 tablet (10 mg total) by mouth 3 (three) times daily as needed for muscle spasms. 12/26/16   Newman Pies, MD  docusate sodium (COLACE) 100 MG capsule Take 1 capsule (100 mg total) by mouth 2 (two) times daily. Patient not taking: Reported on 12/29/2016 12/26/16   Newman Pies, MD  Emollient (CERAVE) CREA Apply 1 application topically 2 (two) times daily. Applied neck down for dermatitis    [provider]  fluticasone (FLONASE) 50 MCG/ACT nasal spray Place 1 spray into both nostrils daily as needed (for sinus issues).     [provider]  furosemide (LASIX) 20 MG tablet Take 1 tablet (20 mg total) by mouth daily. Patient taking differently: Take 20 mg by mouth 2 (two) times daily.  04/29/16 04/29/17  Lavonia Drafts, MD  HYDROmorphone (DILAUDID) 4 MG tablet Take 1 tablet (4 mg total) by mouth every 6 (six) hours as needed for severe pain. 12/26/16   Newman Pies, MD  ipratropium-albuterol (DUONEB) 0.5-2.5 (3) MG/3ML SOLN Take 3 mLs by nebulization every 6 (six) hours as needed (for wheezing/shortness of breath).    [provider]  Iron-Vitamin C (VITRON-C PO) Take 1 tablet by mouth at bedtime.    [provider]  metoprolol succinate (TOPROL-XL) 50 MG 24 hr tablet Take 1 tablet (50 mg total) by mouth every evening. Take with or immediately following a meal. Patient taking differently: Take 50 mg by mouth at bedtime. Take with or immediately following a meal. 12/26/15   Crissman, Jeannette How, MD    mometasone (ELOCON) 0.1 % cream Apply 1 application topically 2 (two) times daily as needed (for dermatitis). For dermatitis 04/24/16   [provider]  mupirocin ointment (BACTROBAN) 2 % Apply 1 application topically 2 (two) times daily as needed. For skin infection. 10/03/16   [provider]  ondansetron (ZOFRAN ODT) 4 MG disintegrating tablet Take 1 tablet (4 mg total) by mouth every 8 (eight) hours as needed for nausea or vomiting. Patient taking differently: Take 4 mg by mouth every 8 (eight) hours as needed for nausea or vomiting. DISSOLVE IN THE MOUTH 01/10/16   Volney American, PA-C  rOPINIRole (REQUIP) 3 MG tablet Take 1 tablet (  3 mg total) by mouth 4 (four) times daily. 04/20/15   [name], MD  sertraline (ZOLOFT) 100 MG tablet Take 100 mg by mouth at bedtime.     [provider]  spironolactone (ALDACTONE) 25 MG tablet Take 25 mg by mouth at bedtime.  05/24/16   [provider]    Allergies Hydrocodone-chlorpheniramine; Prednisone; Sulfa antibiotics; Chlorhexidine; Methadone; Benadryl [diphenhydramine]; Codeine; Diphenhydramine-zinc acetate; Latex; and Tape  Family History  Problem Relation Age of Onset  . Cancer Mother   . Stroke Father   . Diabetes Sister   . Mental illness Sister     Social History Social History  Substance Use Topics  . Smoking status: Former Smoker    Packs/day: 0.75    Years: 51.00    Types: Cigarettes    Start date: 07/08/1964  . Smokeless tobacco: Never Used     Comment: quit 2 weeks ago  . Alcohol use No    Review of Systems  Constitutional: No fever/chills. Eyes: No visual changes. ENT: No sore throat. Cardiovascular: Denies chest pain. Respiratory: Denies shortness of breath. Gastrointestinal: No abdominal pain.  No nausea, no vomiting.  No diarrhea.  No constipation. Genitourinary: Negative for dysuria. Musculoskeletal: positive for back pain. Skin: Negative for rash. Neurological:  Negative for headaches, focal weakness or numbness.   ____________________________________________   PHYSICAL EXAM:  VITAL SIGNS: ED Triage Vitals  Enc Vitals Group     BP 03/03/17 0413 136/82     Pulse Rate 03/03/17 0413 94     Resp 03/03/17 0413 18     Temp 03/03/17 0413 98.2 F (36.8 C)     Temp Source 03/03/17 0413 Oral     SpO2 03/03/17 0413 95 %     Weight 03/03/17 0414 185 lb (83.9 kg)     Height 03/03/17 0414 4\' 11"  (1.499 m)     Head Circumference --      Peak Flow --      Pain Score 03/03/17 0413 5     Pain Loc --      Pain Edu? --      Excl. in [place]? --     Constitutional: Alert and oriented. Well appearing and in no acute distress. Eyes: Conjunctivae are normal. PERRL. EOMI. Head: Atraumatic. Nose: No congestion/rhinnorhea. Mouth/Throat: Mucous membranes are moist.  Oropharynx non-erythematous. Neck: No stridor.  No cervical spine tenderness to palpation. Cardiovascular: Normal rate, regular rhythm. Grossly normal heart sounds.  Good peripheral circulation. Respiratory: Normal respiratory effort.  No retractions. Lungs CTAB. Gastrointestinal: Soft and nontender. No distention. No abdominal bruits. No CVA tenderness. Musculoskeletal: Mild midline lumbar spinal tenderness to palpation. No step-offs or deformities. Incision site clean/dry/intact. No obvious hematoma. No lower extremity tenderness nor edema.  No joint effusions. Neurologic:  Normal speech and language. No gross focal neurologic deficits are appreciated.  Skin:  Skin is warm, dry and intact. No rash noted. Psychiatric: Mood and affect are normal. Speech and behavior are normal.  ____________________________________________   LABS (all labs ordered are listed, but only abnormal results are displayed)  Labs Reviewed  CBC WITH DIFFERENTIAL/PLATELET - Abnormal; Notable for the following:       Result Value   RDW 16.3 (*)    Neutro Abs 7.1 (*)    All other components within normal limits  BASIC  METABOLIC PANEL - Abnormal; Notable for the following:    BUN 35 (*)    Creatinine, Ser 1.24 (*)    Calcium 10.5 (*)  GFR calc non Af Amer 42 (*)    GFR calc Af Amer 49 (*)    All other components within normal limits  URINALYSIS, COMPLETE (UACMP) WITH MICROSCOPIC - Abnormal; Notable for the following:    Color, Urine STRAW (*)    APPearance CLEAR (*)    Squamous Epithelial / LPF 0-5 (*)    All other components within normal limits   ____________________________________________  EKG  none ____________________________________________  RADIOLOGY  No results found.  ____________________________________________   PROCEDURES  Procedure(s) performed: None  Procedures  Critical Care performed: No  ____________________________________________   INITIAL IMPRESSION / ASSESSMENT AND PLAN / ED COURSE  Pertinent labs & imaging results that were available during my care of the patient were reviewed by me and considered in my medical decision making (see chart for details).  71 year old female who presents with lumbar back pain. Recent history of lumbar surgery, on anticoagulation for PE status post fall 1 week ago. Neurologically intact on exam. Will obtain screening lab work, urinalysis, administer analgesia, and proceed with MRI to evaluate for traumatic injuries, hematoma.  Clinical Course as of Mar 03 649  Mon Mar 03, 2017  0648 Laboratory and urinalysis results indicative of mild dehydration. MRI will be done at 7:30am. Care will be transferred to oncoming provider pending results of MRI and patient reassessment.  [JS]    Clinical Course User Index [JS] Paulette Blanch, MD     ____________________________________________   FINAL CLINICAL IMPRESSION(S) / ED DIAGNOSES  Final diagnoses:  Acute midline low back pain without sciatica      NEW MEDICATIONS STARTED DURING THIS VISIT:  New Prescriptions   No medications on file     Note:  This document was  prepared using Dragon voice recognition software and may include unintentional dictation errors.    Paulette Blanch, MD 03/03/17 [phone]

## 2017-03-03 NOTE — ED Provider Notes (Signed)
Discussed MRI results with Dr. Rudene Christians, recommends pain control and outpatient follow up with him re: sacral fractures. Patient has normal neurological exam, able to ambulate well.    Lavonia Drafts, MD 03/03/17 1110

## 2017-03-03 NOTE — ED Notes (Signed)
Pt informed of delay for MRI. MRI Tech called and said that the morning MRI Tech would complete the test.

## 2017-03-03 NOTE — Discharge Instructions (Signed)
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 Please rest and avoid exertion. It is ok to walk and move around as you have been doing. Follow up with Dr. Rudene Christians as we discussed in 1-2 weeks

## 2017-03-03 NOTE — ED Triage Notes (Signed)
Patient states that she had surgery on her lumbar spine June 20th. Patient states that her pain has recently started becoming worse. Patient states that she stopped taking her pain medication and started taking tylenol 4 days ago.

## 2017-03-14 DIAGNOSIS — I2602 Saddle embolus of pulmonary artery with acute cor pulmonale: Secondary | ICD-10-CM | POA: Diagnosis not present

## 2017-03-14 DIAGNOSIS — R040 Epistaxis: Secondary | ICD-10-CM | POA: Diagnosis not present

## 2017-03-18 DIAGNOSIS — B850 Pediculosis due to Pediculus humanus capitis: Secondary | ICD-10-CM | POA: Diagnosis not present

## 2017-03-24 DIAGNOSIS — F22 Delusional disorders: Secondary | ICD-10-CM | POA: Diagnosis not present

## 2017-03-24 DIAGNOSIS — B850 Pediculosis due to Pediculus humanus capitis: Secondary | ICD-10-CM | POA: Diagnosis not present

## 2017-03-25 DIAGNOSIS — M545 Low back pain: Secondary | ICD-10-CM | POA: Diagnosis not present

## 2017-03-25 DIAGNOSIS — Z6834 Body mass index (BMI) 34.0-34.9, adult: Secondary | ICD-10-CM | POA: Diagnosis not present

## 2017-03-25 DIAGNOSIS — M8448XD Pathological fracture, other site, subsequent encounter for fracture with routine healing: Secondary | ICD-10-CM | POA: Diagnosis not present

## 2017-03-27 DIAGNOSIS — R5383 Other fatigue: Secondary | ICD-10-CM | POA: Diagnosis not present

## 2017-03-27 DIAGNOSIS — M5116 Intervertebral disc disorders with radiculopathy, lumbar region: Secondary | ICD-10-CM | POA: Diagnosis not present

## 2017-04-15 DIAGNOSIS — R21 Rash and other nonspecific skin eruption: Secondary | ICD-10-CM | POA: Diagnosis not present

## 2017-04-15 DIAGNOSIS — L988 Other specified disorders of the skin and subcutaneous tissue: Secondary | ICD-10-CM | POA: Diagnosis not present

## 2017-04-25 DIAGNOSIS — Z23 Encounter for immunization: Secondary | ICD-10-CM | POA: Diagnosis not present

## 2017-04-25 DIAGNOSIS — I890 Lymphedema, not elsewhere classified: Secondary | ICD-10-CM | POA: Diagnosis not present

## 2017-04-25 DIAGNOSIS — I5033 Acute on chronic diastolic (congestive) heart failure: Secondary | ICD-10-CM | POA: Diagnosis not present

## 2017-04-25 DIAGNOSIS — Z79899 Other long term (current) drug therapy: Secondary | ICD-10-CM | POA: Diagnosis not present

## 2017-04-25 DIAGNOSIS — E538 Deficiency of other specified B group vitamins: Secondary | ICD-10-CM | POA: Diagnosis not present

## 2017-05-09 DIAGNOSIS — G4710 Hypersomnia, unspecified: Secondary | ICD-10-CM | POA: Diagnosis not present

## 2017-05-27 DIAGNOSIS — M48062 Spinal stenosis, lumbar region with neurogenic claudication: Secondary | ICD-10-CM | POA: Diagnosis not present

## 2017-05-27 DIAGNOSIS — Z6836 Body mass index (BMI) 36.0-36.9, adult: Secondary | ICD-10-CM | POA: Diagnosis not present

## 2017-05-27 DIAGNOSIS — I10 Essential (primary) hypertension: Secondary | ICD-10-CM | POA: Diagnosis not present

## 2017-05-27 DIAGNOSIS — R21 Rash and other nonspecific skin eruption: Secondary | ICD-10-CM | POA: Diagnosis not present

## 2017-06-04 DIAGNOSIS — Z7982 Long term (current) use of aspirin: Secondary | ICD-10-CM | POA: Diagnosis not present

## 2017-06-04 DIAGNOSIS — Z981 Arthrodesis status: Secondary | ICD-10-CM | POA: Diagnosis not present

## 2017-06-04 DIAGNOSIS — Z96643 Presence of artificial hip joint, bilateral: Secondary | ICD-10-CM | POA: Diagnosis not present

## 2017-06-04 DIAGNOSIS — M48062 Spinal stenosis, lumbar region with neurogenic claudication: Secondary | ICD-10-CM | POA: Diagnosis not present

## 2017-06-04 DIAGNOSIS — M8448XD Pathological fracture, other site, subsequent encounter for fracture with routine healing: Secondary | ICD-10-CM | POA: Diagnosis not present

## 2017-06-04 DIAGNOSIS — Z9181 History of falling: Secondary | ICD-10-CM | POA: Diagnosis not present

## 2017-06-05 DIAGNOSIS — Z7982 Long term (current) use of aspirin: Secondary | ICD-10-CM | POA: Diagnosis not present

## 2017-06-05 DIAGNOSIS — Z9181 History of falling: Secondary | ICD-10-CM | POA: Diagnosis not present

## 2017-06-05 DIAGNOSIS — Z981 Arthrodesis status: Secondary | ICD-10-CM | POA: Diagnosis not present

## 2017-06-05 DIAGNOSIS — Z96643 Presence of artificial hip joint, bilateral: Secondary | ICD-10-CM | POA: Diagnosis not present

## 2017-06-05 DIAGNOSIS — M8448XD Pathological fracture, other site, subsequent encounter for fracture with routine healing: Secondary | ICD-10-CM | POA: Diagnosis not present

## 2017-06-05 DIAGNOSIS — M48062 Spinal stenosis, lumbar region with neurogenic claudication: Secondary | ICD-10-CM | POA: Diagnosis not present

## 2017-06-09 DIAGNOSIS — M8448XD Pathological fracture, other site, subsequent encounter for fracture with routine healing: Secondary | ICD-10-CM | POA: Diagnosis not present

## 2017-06-09 DIAGNOSIS — Z96643 Presence of artificial hip joint, bilateral: Secondary | ICD-10-CM | POA: Diagnosis not present

## 2017-06-09 DIAGNOSIS — M48062 Spinal stenosis, lumbar region with neurogenic claudication: Secondary | ICD-10-CM | POA: Diagnosis not present

## 2017-06-09 DIAGNOSIS — Z981 Arthrodesis status: Secondary | ICD-10-CM | POA: Diagnosis not present

## 2017-06-09 DIAGNOSIS — Z7982 Long term (current) use of aspirin: Secondary | ICD-10-CM | POA: Diagnosis not present

## 2017-06-09 DIAGNOSIS — Z9181 History of falling: Secondary | ICD-10-CM | POA: Diagnosis not present

## 2017-06-11 DIAGNOSIS — M48062 Spinal stenosis, lumbar region with neurogenic claudication: Secondary | ICD-10-CM | POA: Diagnosis not present

## 2017-06-11 DIAGNOSIS — Z9181 History of falling: Secondary | ICD-10-CM | POA: Diagnosis not present

## 2017-06-11 DIAGNOSIS — Z981 Arthrodesis status: Secondary | ICD-10-CM | POA: Diagnosis not present

## 2017-06-11 DIAGNOSIS — M8448XD Pathological fracture, other site, subsequent encounter for fracture with routine healing: Secondary | ICD-10-CM | POA: Diagnosis not present

## 2017-06-11 DIAGNOSIS — Z7982 Long term (current) use of aspirin: Secondary | ICD-10-CM | POA: Diagnosis not present

## 2017-06-11 DIAGNOSIS — Z96643 Presence of artificial hip joint, bilateral: Secondary | ICD-10-CM | POA: Diagnosis not present

## 2017-06-18 DIAGNOSIS — Z981 Arthrodesis status: Secondary | ICD-10-CM | POA: Diagnosis not present

## 2017-06-18 DIAGNOSIS — M48062 Spinal stenosis, lumbar region with neurogenic claudication: Secondary | ICD-10-CM | POA: Diagnosis not present

## 2017-06-18 DIAGNOSIS — Z9181 History of falling: Secondary | ICD-10-CM | POA: Diagnosis not present

## 2017-06-18 DIAGNOSIS — M8448XD Pathological fracture, other site, subsequent encounter for fracture with routine healing: Secondary | ICD-10-CM | POA: Diagnosis not present

## 2017-06-18 DIAGNOSIS — Z7982 Long term (current) use of aspirin: Secondary | ICD-10-CM | POA: Diagnosis not present

## 2017-06-18 DIAGNOSIS — Z96643 Presence of artificial hip joint, bilateral: Secondary | ICD-10-CM | POA: Diagnosis not present

## 2017-06-19 DIAGNOSIS — M48062 Spinal stenosis, lumbar region with neurogenic claudication: Secondary | ICD-10-CM | POA: Diagnosis not present

## 2017-06-19 DIAGNOSIS — Z981 Arthrodesis status: Secondary | ICD-10-CM | POA: Diagnosis not present

## 2017-06-19 DIAGNOSIS — Z9181 History of falling: Secondary | ICD-10-CM | POA: Diagnosis not present

## 2017-06-19 DIAGNOSIS — Z7982 Long term (current) use of aspirin: Secondary | ICD-10-CM | POA: Diagnosis not present

## 2017-06-19 DIAGNOSIS — Z96643 Presence of artificial hip joint, bilateral: Secondary | ICD-10-CM | POA: Diagnosis not present

## 2017-06-19 DIAGNOSIS — M8448XD Pathological fracture, other site, subsequent encounter for fracture with routine healing: Secondary | ICD-10-CM | POA: Diagnosis not present

## 2017-06-24 DIAGNOSIS — M8448XD Pathological fracture, other site, subsequent encounter for fracture with routine healing: Secondary | ICD-10-CM | POA: Diagnosis not present

## 2017-06-24 DIAGNOSIS — M48062 Spinal stenosis, lumbar region with neurogenic claudication: Secondary | ICD-10-CM | POA: Diagnosis not present

## 2017-06-24 DIAGNOSIS — Z9181 History of falling: Secondary | ICD-10-CM | POA: Diagnosis not present

## 2017-06-24 DIAGNOSIS — Z96643 Presence of artificial hip joint, bilateral: Secondary | ICD-10-CM | POA: Diagnosis not present

## 2017-06-24 DIAGNOSIS — Z981 Arthrodesis status: Secondary | ICD-10-CM | POA: Diagnosis not present

## 2017-06-24 DIAGNOSIS — Z7982 Long term (current) use of aspirin: Secondary | ICD-10-CM | POA: Diagnosis not present

## 2017-06-26 DIAGNOSIS — M8448XD Pathological fracture, other site, subsequent encounter for fracture with routine healing: Secondary | ICD-10-CM | POA: Diagnosis not present

## 2017-06-26 DIAGNOSIS — Z96643 Presence of artificial hip joint, bilateral: Secondary | ICD-10-CM | POA: Diagnosis not present

## 2017-06-26 DIAGNOSIS — Z981 Arthrodesis status: Secondary | ICD-10-CM | POA: Diagnosis not present

## 2017-06-26 DIAGNOSIS — M48062 Spinal stenosis, lumbar region with neurogenic claudication: Secondary | ICD-10-CM | POA: Diagnosis not present

## 2017-06-26 DIAGNOSIS — Z7982 Long term (current) use of aspirin: Secondary | ICD-10-CM | POA: Diagnosis not present

## 2017-06-26 DIAGNOSIS — Z9181 History of falling: Secondary | ICD-10-CM | POA: Diagnosis not present

## 2017-07-02 DIAGNOSIS — Z981 Arthrodesis status: Secondary | ICD-10-CM | POA: Diagnosis not present

## 2017-07-02 DIAGNOSIS — M8448XD Pathological fracture, other site, subsequent encounter for fracture with routine healing: Secondary | ICD-10-CM | POA: Diagnosis not present

## 2017-07-02 DIAGNOSIS — Z7982 Long term (current) use of aspirin: Secondary | ICD-10-CM | POA: Diagnosis not present

## 2017-07-02 DIAGNOSIS — Z96643 Presence of artificial hip joint, bilateral: Secondary | ICD-10-CM | POA: Diagnosis not present

## 2017-07-02 DIAGNOSIS — Z9181 History of falling: Secondary | ICD-10-CM | POA: Diagnosis not present

## 2017-07-02 DIAGNOSIS — M48062 Spinal stenosis, lumbar region with neurogenic claudication: Secondary | ICD-10-CM | POA: Diagnosis not present

## 2017-07-03 DIAGNOSIS — Z9181 History of falling: Secondary | ICD-10-CM | POA: Diagnosis not present

## 2017-07-03 DIAGNOSIS — M48062 Spinal stenosis, lumbar region with neurogenic claudication: Secondary | ICD-10-CM | POA: Diagnosis not present

## 2017-07-03 DIAGNOSIS — Z7982 Long term (current) use of aspirin: Secondary | ICD-10-CM | POA: Diagnosis not present

## 2017-07-03 DIAGNOSIS — M8448XD Pathological fracture, other site, subsequent encounter for fracture with routine healing: Secondary | ICD-10-CM | POA: Diagnosis not present

## 2017-07-03 DIAGNOSIS — Z96643 Presence of artificial hip joint, bilateral: Secondary | ICD-10-CM | POA: Diagnosis not present

## 2017-07-03 DIAGNOSIS — Z981 Arthrodesis status: Secondary | ICD-10-CM | POA: Diagnosis not present

## 2017-09-02 DIAGNOSIS — Z0000 Encounter for general adult medical examination without abnormal findings: Secondary | ICD-10-CM | POA: Insufficient documentation

## 2017-12-23 ENCOUNTER — Emergency Department
Admission: EM | Admit: 2017-12-23 | Discharge: 2017-12-23 | Disposition: A | Discharge status: Home / Self Care | Payer: Medicare HMO | Attending: Emergency Medicine | Admitting: Emergency Medicine

## 2017-12-23 ENCOUNTER — Emergency Department: Payer: Medicare HMO

## 2017-12-23 ENCOUNTER — Other Ambulatory Visit: Payer: Self-pay

## 2017-12-23 DIAGNOSIS — Z85038 Personal history of other malignant neoplasm of large intestine: Secondary | ICD-10-CM | POA: Insufficient documentation

## 2017-12-23 DIAGNOSIS — J449 Chronic obstructive pulmonary disease, unspecified: Secondary | ICD-10-CM | POA: Diagnosis not present

## 2017-12-23 DIAGNOSIS — Z96642 Presence of left artificial hip joint: Secondary | ICD-10-CM | POA: Diagnosis not present

## 2017-12-23 DIAGNOSIS — Z87891 Personal history of nicotine dependence: Secondary | ICD-10-CM | POA: Insufficient documentation

## 2017-12-23 DIAGNOSIS — Z96641 Presence of right artificial hip joint: Secondary | ICD-10-CM | POA: Diagnosis not present

## 2017-12-23 DIAGNOSIS — I10 Essential (primary) hypertension: Secondary | ICD-10-CM | POA: Insufficient documentation

## 2017-12-23 DIAGNOSIS — M25552 Pain in left hip: Secondary | ICD-10-CM | POA: Diagnosis present

## 2017-12-23 DIAGNOSIS — Z91040 Latex allergy status: Secondary | ICD-10-CM | POA: Diagnosis not present

## 2017-12-23 LAB — CBC
HCT: 39.7 % (ref 35.0–47.0)
Hemoglobin: 13.6 g/dL (ref 12.0–16.0)
MCH: 30.9 pg (ref 26.0–34.0)
MCHC: 34.1 g/dL (ref 32.0–36.0)
MCV: 90.5 fL (ref 80.0–100.0)
PLATELETS: 194 10*3/uL (ref 150–440)
RBC: 4.39 MIL/uL (ref 3.80–5.20)
RDW: 15.1 % — AB (ref 11.5–14.5)
WBC: 6.1 10*3/uL (ref 3.6–11.0)

## 2017-12-23 LAB — BASIC METABOLIC PANEL
Anion gap: 9 (ref 5–15)
BUN: 31 mg/dL — AB (ref 6–20)
CALCIUM: 10.5 mg/dL — AB (ref 8.9–10.3)
CO2: 23 mmol/L (ref 22–32)
CREATININE: 0.78 mg/dL (ref 0.44–1.00)
Chloride: 104 mmol/L (ref 101–111)
GFR calc Af Amer: >60 mL/min (ref >60)
GLUCOSE: 102 mg/dL — AB (ref 65–99)
Potassium: 4.3 mmol/L (ref 3.5–5.1)
Sodium: 136 mmol/L (ref 135–145)

## 2017-12-23 MED ORDER — OXYCODONE-ACETAMINOPHEN 5-325 MG PO TABS: 1.0000 | ORAL_TABLET | Freq: Four times a day (QID) | ORAL | 0 refills | Status: DC | PRN

## 2017-12-23 MED ORDER — IOPAMIDOL (ISOVUE-300) INJECTION 61%
100.0000 mL | Freq: Once | INTRAVENOUS | Status: AC | PRN
  Administered 2017-12-23: 100 mL via INTRAVENOUS
  Filled 2017-12-23: qty 100

## 2017-12-23 MED ORDER — IBUPROFEN 600 MG PO TABS: 600.0000 mg | ORAL_TABLET | Freq: Three times a day (TID) | ORAL | 0 refills | Status: DC | PRN

## 2017-12-23 MED ORDER — SODIUM CHLORIDE 0.9 % IV BOLUS
1000.0000 mL | Freq: Once | INTRAVENOUS | Status: AC
  Administered 2017-12-23: 1000 mL via INTRAVENOUS

## 2017-12-23 MED ORDER — HYDROMORPHONE HCL 1 MG/ML IJ SOLN
1.0000 mg | Freq: Once | INTRAMUSCULAR | Status: AC
  Administered 2017-12-23: 1 mg via INTRAVENOUS
  Filled 2017-12-23: qty 1

## 2017-12-23 NOTE — Discharge Instructions (Addendum)
Please continue to ambulate and weight-bear as tolerated.  You may take Tylenol or Motrin, with food, for mild to moderate pain.  Percocet is for severe pain.  Do not drive within 8 hours of taking Percocet.  Please be careful taking Percocet as it can make you unsteady on your feet; please take all fall precautions.  Return to the emergency department if you develop severe pain, numbness tingling or weakness, inability to walk, fever, or any other symptoms concerning to you.

## 2017-12-23 NOTE — ED Provider Notes (Signed)
[facility] Emergency Department Provider Note  ____________________________________________  Time seen: Approximately 6:06 PM  I have reviewed the triage vital signs and the nursing notes.   HISTORY  Chief Complaint Hip Pain    HPI Ann Silva is a 72 y.o. female with a history of left hip arthroplasty and revision, septic arthritis, hip dislocation, x10 between 2011 and 2015 presenting with left hip pain.  The patient reports that for the past week, she has had a "burning" sensation and feels like the left hip is "loose."  She has continued to be able to ambulate on the leg but it causes pain.  The patient has tried Percocet, which was initially helping but over the last 2 nights she has been unable to sleep due to pain.  She has not had any nausea or vomiting, but has had subjective fever.  No shaking chills.  No urinary symptoms.  No numbness tingling or weakness.  Past Medical History:  Diagnosis Date  . Anemia    takes ferrous sulfate daily  . Anxiety   . Arthritis   . Bladder spasm   . Chronic back pain    spinal stenosis  . Colon cancer ([place])   . Complication of anesthesia    B/P bottoms out, hard to wake up   . Contact dermatitis   . COPD (chronic obstructive pulmonary disease) (HCC)    Albuterol inhaler and DuoNeb as needed  . Coronary artery disease    mild by 08/25/09 cath  . Headache   . Heart murmur    never had any problems  . History of blood transfusion    no abnormal reaction noted  . History of bronchitis   . History of MRSA infection 6+ yrs ago  . Hypertension    takes Metoprolol daily  . Insomnia    takes Trazodone nightly  . Joint pain   . Joint swelling   . Nocturia   . Panic attacks    takes Effexor daily  . Peripheral edema    takes Lasix daily and takes Bumex every 3 days.Takes Aldactone daily   . Restless leg syndrome    takes Requip daily  . RLS (restless legs syndrome)   . Sleep apnea    doesnt use CPAP     Patient Active Problem List   Diagnosis Date Noted  . Dyspnea 12/29/2016  . Acute congestive heart failure ([place])   . HCAP (healthcare-associated pneumonia)   . Pulmonary embolus ([place])   . Lumbar adjacent segment disease with spondylolisthesis 12/25/2016  . COPD with acute exacerbation ([place]) 11/27/2015  . COPD exacerbation ([place]) 08/21/2015  . Restless legs 02/27/2015  . Hypertension   . Colon cancer ([place])   . Depression   . History of repair of hip joint 10/25/2013    Past Surgical History:  Procedure Laterality Date  . ABDOMINAL HYSTERECTOMY    . BACK SURGERY    . CHOLECYSTECTOMY    . COLON SURGERY     partial colectomy d/t cancer cells  . COLONOSCOPY    . I&D of left hip      x 4  . JOINT REPLACEMENT    . Resection total hip    . Revision of total hip arthroplasty     x3-4  . right hip replacement    . TOTAL HIP ARTHROPLASTY Left     Current Outpatient Rx  . Order #: [id number] Class: Historical Med  . Order #: [id number] Class: Print  . Order #: [id number] Class:  No Print  . Order #: [id number] Class: Print  . Order #: [id number] Class: Historical Med  . Order #: [id number] Class: Historical Med  . Order #: [id number] Class: Historical Med  . Order #: [id number] Class: Normal  . Order #: [id number] Class: Normal  . Order #: [id number] Class: Historical Med  . Order #: [id number] Class: Historical Med  . Order #: [id number] Class: Print  . Order #: [id number] Class: Print  . Order #: [id number] Class: Historical Med  . Order #: [id number] Class: Historical Med  . Order #: [id number] Class: Normal  . Order #: [id number] Class: Historical Med  . Order #: [id number] Class: Historical Med  . Order #: [id number] Class: Normal  . Order #: [id number] Class: Print  . Order #: [id number] Class: Print  . Order #: [id number] Class: Normal  . Order #: [id number] Class: Historical Med    Allergies Hydrocodone-chlorpheniramine; Prednisone; Sulfa antibiotics; Chlorhexidine; Methadone; Benadryl  [diphenhydramine]; Codeine; Diphenhydramine-zinc acetate; Latex; and Tape  Family History  Problem Relation Age of Onset  . Cancer Mother   . Stroke Father   . Diabetes Sister   . Mental illness Sister     Social History Social History   Tobacco Use  . Smoking status: Former Smoker    Packs/day: 0.75    Years: 51.00    Pack years: 38.25    Types: Cigarettes    Start date: 07/08/1964  . Smokeless tobacco: Never Used  . Tobacco comment: quit 2 weeks ago  Substance Use Topics  . Alcohol use: No  . Drug use: No    Review of Systems Constitutional: Positive subjective fever.  Lightheadedness or syncope.  No trauma. Eyes: No visual changes. ENT: No sore throat. No congestion or rhinorrhea. Cardiovascular: Denies chest pain. Denies palpitations. Respiratory: Denies shortness of breath.  No cough. Gastrointestinal: No abdominal pain.  No nausea, no vomiting.  No diarrhea.  No constipation. Genitourinary: Negative for dysuria. Musculoskeletal: Negative for back pain.  Positive left hip pain. Skin: Negative for rash. Neurological: Negative for headaches. No focal numbness, tingling or weakness.     ____________________________________________   PHYSICAL EXAM:  VITAL SIGNS: ED Triage Vitals  Enc Vitals Group     BP 12/23/17 1742 120/61     Pulse Rate 12/23/17 1742 71     Resp 12/23/17 1742 16     Temp 12/23/17 1742 98.9 F (37.2 C)     Temp src --      SpO2 12/23/17 1742 94 %     Weight 12/23/17 1743 174 lb (78.9 kg)     Height 12/23/17 1743 5' (1.524 m)     Head Circumference --      Peak Flow --      Pain Score 12/23/17 1747 8     Pain Loc --      Pain Edu? --      Excl. in [place]? --     Constitutional: Alert and oriented.  Answers questions appropriately.  Uncomfortable appearing but nontoxic. Eyes: Conjunctivae are normal.  EOMI. No scleral icterus. Head: Atraumatic. Nose: No congestion/rhinnorhea. Mouth/Throat: Mucous membranes are moist.  Neck: No stridor.   Supple.   Cardiovascular: Normal rate, regular rhythm. No murmurs, rubs or gallops.  Respiratory: Normal respiratory effort.  No accessory muscle use or retractions. Lungs CTAB.  No wheezes, rales or ronchi. Gastrointestinal: Obese.  Soft, nontender and nondistended.  No guarding or rebound.  No peritoneal signs. Musculoskeletal: No symmetric lower extremity edema that is nonpitting.  The patient has full range of motion of the bilateral ankles, right knee and right hip without pain.  The left knee exam is limited due to pain in the left hip.  The patient has significant pain with movement of the left hip but no pain with palpation over the greater trochanter. Neurologic:  A&Ox3.  Speech is clear.  Face and smile are symmetric.  EOMI.  Motor strength in the right lower extremity; motor examination of the left lower extremity is limited due to pain.  Sensation to light touch in the bilateral lower extremities. Skin:  Skin is warm, dry.  She does have several areas that are less than 1 x 1 cm of open excoriations throughout the lateral aspect of the left lower extremity without any overlying erythema, swelling, or discharge; she states these are chronic. Psychiatric: Mood and affect are normal. Speech and behavior are normal.  Normal judgement.  ____________________________________________   LABS (all labs ordered are listed, but only abnormal results are displayed)  Labs Reviewed  CBC - Abnormal; Notable for the following components:      Result Value   RDW 15.1 (*)    All other components within normal limits  BASIC METABOLIC PANEL - Abnormal; Notable for the following components:   Glucose, Bld 102 (*)    BUN 31 (*)    Calcium 10.5 (*)    All other components within normal limits  CULTURE, BLOOD (ROUTINE X 2)  CULTURE, BLOOD (ROUTINE X 2)   ____________________________________________  EKG  Not indicated ____________________________________________  RADIOLOGY  Ct Hip Left W  Contrast  Result Date: 12/23/2017 CLINICAL DATA:  Left hip pain. Assess for infection or fracture. History of prior left hip arthroplasty with subsequent revision due to orthopedic hardware recall. Patient feels same as prior to revision. EXAM: CT OF THE LOWER LEFT EXTREMITY WITH CONTRAST TECHNIQUE: Multidetector CT imaging of the lower left extremity was performed according to the standard protocol following intravenous contrast administration. COMPARISON:  10/16/2011 CT, left hip radiographs 04/22/2016 CONTRAST:  172mL ISOVUE-300 IOPAMIDOL (ISOVUE-300) INJECTION 61% FINDINGS: Bones/Joint/Cartilage Uncemented long-stem left hip arthroplasty with 3 cerclage wires encircling the left femoral shaft and femoral component of the hip arthroplasty are identified. Osteopenic appearance of the left femur. There are retained cerclage wire fragments within the soft tissues of the left thigh, 3 of which are noted laterally and 1 medially starting from the middle cerclage wire and just caudal to the distal-most encircling cerclage wire. No evidence of hardware failure or loosening. No significant joint effusion or joint dislocation. No fracture is identified. Ligaments Suboptimally assessed by CT. Muscles and Tendons No evidence of intramuscular hemorrhage or abscess. No significant atrophy. Soft tissues Scarring noted along the lateral aspect of the left thigh. IMPRESSION: 1. Intact long-stem uncemented left hip arthroplasty without evidence of fracture or hardware failure. No loosening is identified. 2. Chronic stable retained cerclage fragments within the left thigh. No associated abscess is identified. Overlying left lateral soft tissue scarring. Electronically Signed   By: Ashley Royalty M.D.   On: 12/23/2017 19:29    ____________________________________________   PROCEDURES  Procedure(s) performed: None  Procedures  Critical Care performed: No ____________________________________________   INITIAL  IMPRESSION / ASSESSMENT AND PLAN / ED COURSE  Pertinent labs & imaging results that were available during my care of the patient were reviewed by me and considered in my medical decision making (see chart for details).  72 y.o. female with a history of multiple left hip surgeries presenting with 1 week of progressively worsening pain, now with subjective fevers.  Overall, the patient is hemodynamically stable and afebrile  in the emergency department.  However, she does have significant pain with any movement of the left hip.  We will get a CT scan to evaluate for fracture, dislocation, or abscess.  Basic laboratory studies and blood cultures have also been sent.  The patient will be treated symptomatically.  Plan reevaluation for final disposition.  ----------------------------------------- 8:15 PM on 12/23/2017 -----------------------------------------  The patient's work-up in the emergency department has been reassuring.  She has remained hemodynamically stable, afebrile, and has a white blood cell count of 6.1.  In addition, the CT of her hip does not show any acute process, including fracture or signs of infection.  The patient's pain has significantly improved with symptomatic treatment.  At this time, we will plan to discharge the patient home with instructions to take Tylenol and Motrin for mild to moderate pain and Percocet for severe pain.  She understands return precautions as well as follow-up instructions.  She Artie has a scheduled appointment with Dr. Marry Guan in 2 weeks and I have asked her to call the clinic to see if they are able to move this up.  ____________________________________________  FINAL CLINICAL IMPRESSION(S) / ED DIAGNOSES  Final diagnoses:  Left hip pain         NEW MEDICATIONS STARTED DURING THIS VISIT:  New Prescriptions   IBUPROFEN (ADVIL,MOTRIN) 600 MG TABLET    Take 1 tablet (600 mg total) by mouth every 8 (eight) hours as needed for mild pain or  moderate pain (with food).   OXYCODONE-ACETAMINOPHEN (PERCOCET) 5-325 MG TABLET    Take 1-2 tablets by mouth every 6 (six) hours as needed for severe pain.      Eula Listen, MD 12/23/17 2016

## 2017-12-23 NOTE — ED Notes (Signed)
MD at the bedside to discuss pt plan of care.  

## 2017-12-23 NOTE — ED Triage Notes (Signed)
Pt c/o L hip pain x 1 week. Denies injury. Pain radiates into leg and lower back. States has had surgery on L hip before. Alert, oriented, in wheelchair.

## 2017-12-23 NOTE — ED Notes (Signed)
Pt ambulatory to restroom with steady gait.

## 2017-12-23 NOTE — ED Notes (Signed)
Blood Cultures were drawn by the [facility] threw out papers in the trash.

## 2017-12-28 LAB — CULTURE, BLOOD (ROUTINE X 2)
Culture: NO GROWTH
Culture: NO GROWTH

## 2018-01-09 IMAGING — CR DG KNEE COMPLETE 4+V*L*
5 series · 5 of 5 positions shown · non-contrast
Comparison: Left femur radiographs performed 09/23/2011

CLINICAL DATA: Status post fall, with left knee pain. Initial
encounter.

EXAM:
LEFT KNEE - COMPLETE 4+ VIEW

[knee ap (1 of 2)]
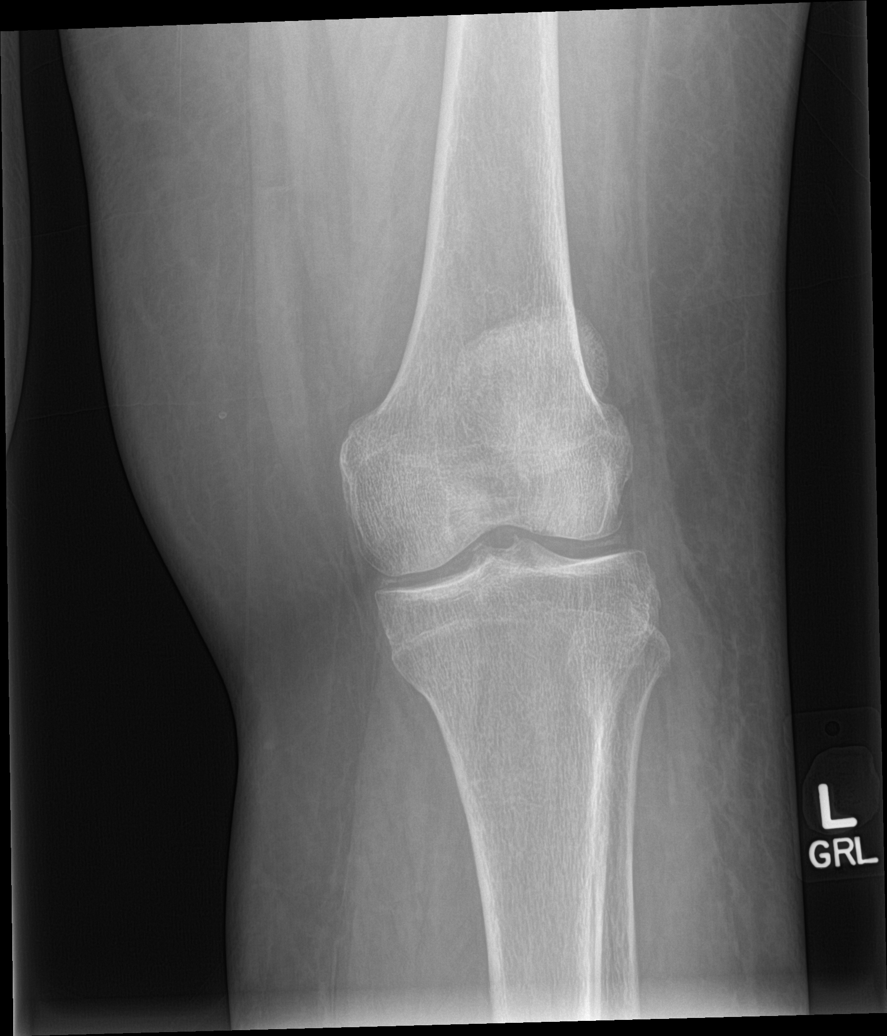

[knee obl (1 of 2)]
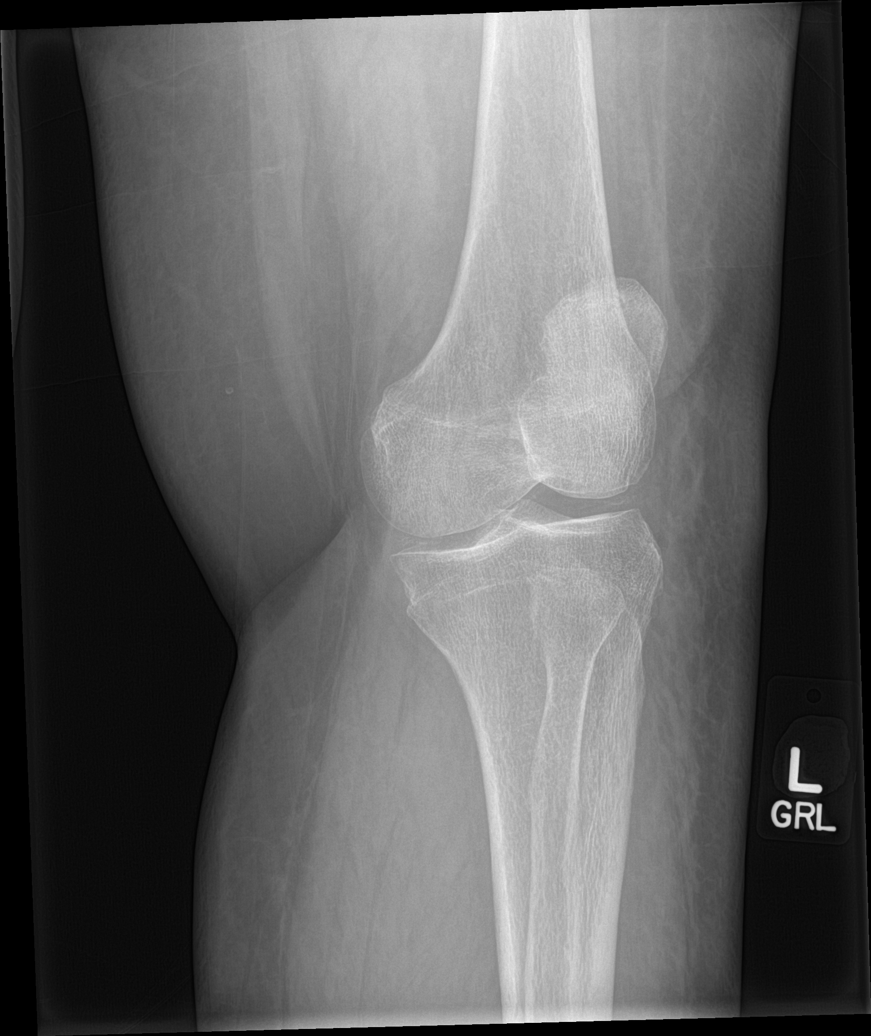

[knee obl (2 of 2)]
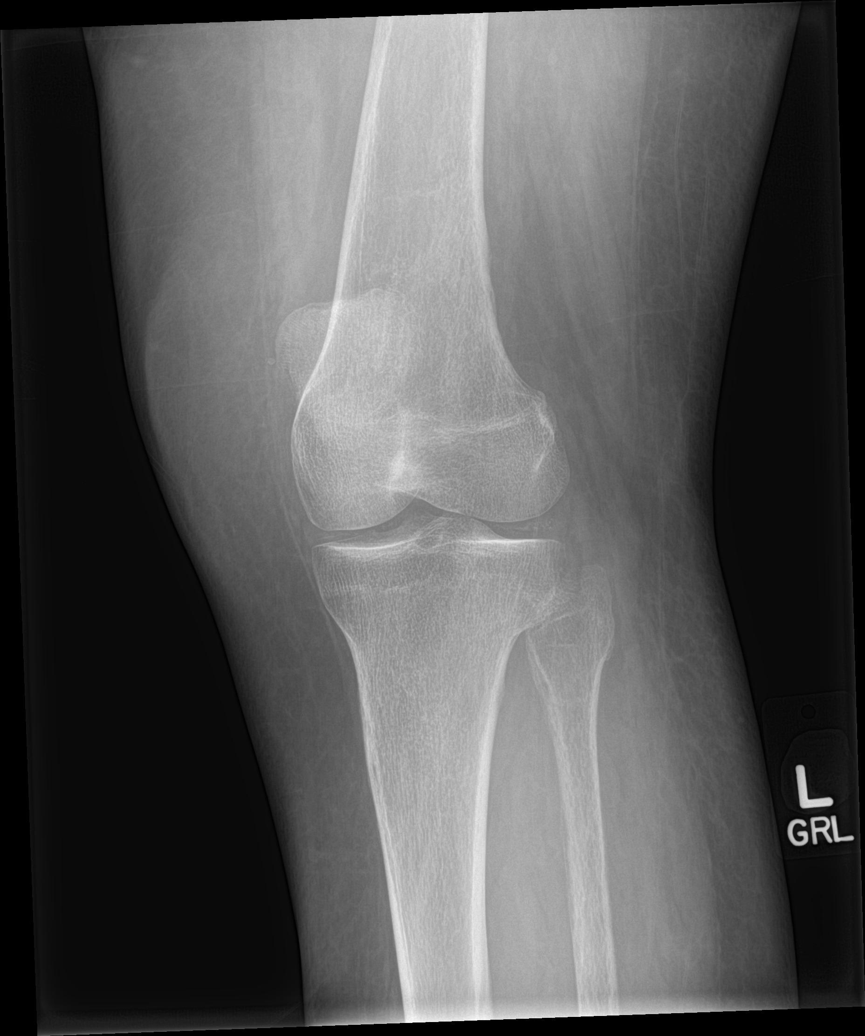

[knee lat]
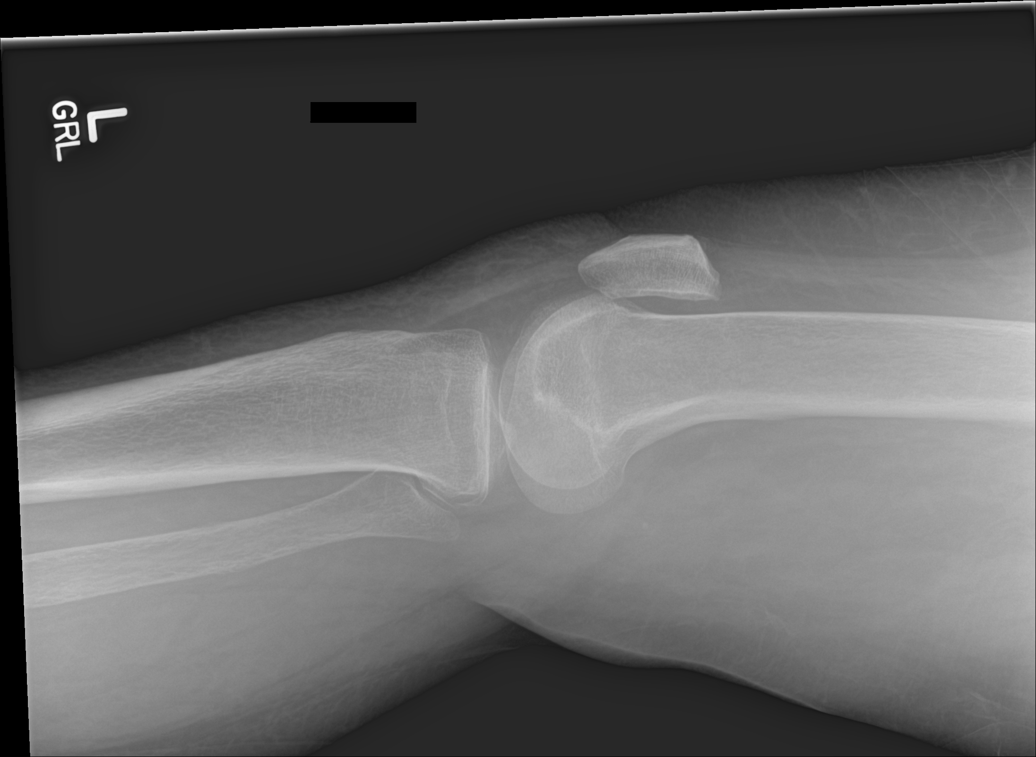

[knee ap (2 of 2)]
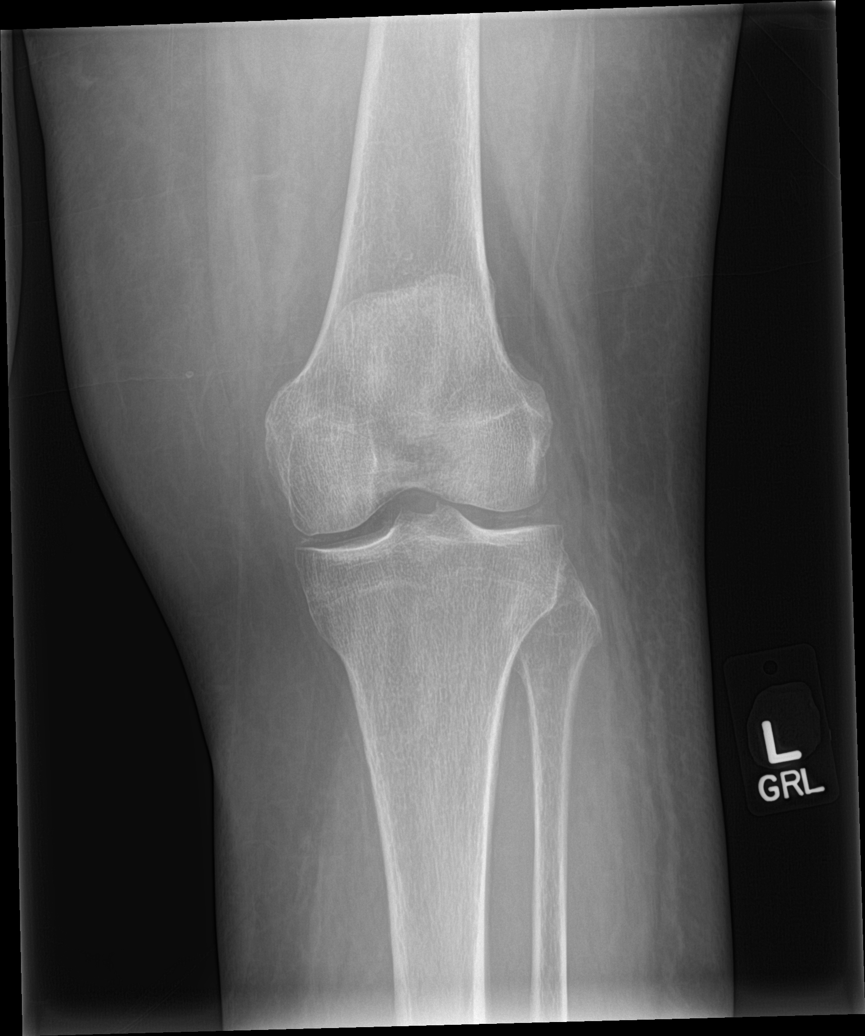

[5 of 5 positions shown; findings below may reference images not displayed]

FINDINGS: There is no evidence of fracture or dislocation. The joint spaces
are preserved. No significant degenerative change is seen; the
patellofemoral joint is grossly unremarkable in appearance.

No significant joint effusion is seen. Mild chondrocalcinosis is
noted. The visualized soft tissues are normal in appearance.
IMPRESSION: 1. No evidence of fracture or dislocation.
2. Mild chondrocalcinosis noted.

## 2018-01-13 ENCOUNTER — Other Ambulatory Visit: Payer: Self-pay | Admitting: Neurosurgery

## 2018-01-13 DIAGNOSIS — M5442 Lumbago with sciatica, left side: Secondary | ICD-10-CM

## 2018-01-20 DIAGNOSIS — E210 Primary hyperparathyroidism: Secondary | ICD-10-CM | POA: Insufficient documentation

## 2018-01-22 ENCOUNTER — Ambulatory Visit
Admission: RE | Admit: 2018-01-22 | Discharge: 2018-01-22 | Disposition: A | Discharge status: Home / Self Care | Payer: Medicare HMO | Source: Ambulatory Visit | Attending: Neurosurgery | Admitting: Neurosurgery

## 2018-01-22 DIAGNOSIS — M48061 Spinal stenosis, lumbar region without neurogenic claudication: Secondary | ICD-10-CM | POA: Insufficient documentation

## 2018-01-22 DIAGNOSIS — M5126 Other intervertebral disc displacement, lumbar region: Secondary | ICD-10-CM | POA: Insufficient documentation

## 2018-01-22 DIAGNOSIS — M5442 Lumbago with sciatica, left side: Secondary | ICD-10-CM | POA: Diagnosis present

## 2018-01-22 MED ORDER — GADOBENATE DIMEGLUMINE 529 MG/ML IV SOLN
17.0000 mL | Freq: Once | INTRAVENOUS | Status: AC | PRN
  Administered 2018-01-22: 17 mL via INTRAVENOUS

## 2018-02-10 ENCOUNTER — Other Ambulatory Visit: Payer: Self-pay | Admitting: Neurosurgery

## 2018-02-27 ENCOUNTER — Other Ambulatory Visit: Payer: Self-pay

## 2018-02-27 ENCOUNTER — Emergency Department (HOSPITAL_COMMUNITY)
Admission: EM | Admit: 2018-02-27 | Discharge: 2018-02-28 | Disposition: A | Discharge status: Home / Self Care | Payer: Medicare HMO | Attending: Emergency Medicine | Admitting: Emergency Medicine

## 2018-02-27 ENCOUNTER — Encounter (HOSPITAL_COMMUNITY): Payer: Self-pay | Admitting: *Deleted

## 2018-02-27 DIAGNOSIS — J449 Chronic obstructive pulmonary disease, unspecified: Secondary | ICD-10-CM | POA: Diagnosis not present

## 2018-02-27 DIAGNOSIS — Z87891 Personal history of nicotine dependence: Secondary | ICD-10-CM | POA: Diagnosis not present

## 2018-02-27 DIAGNOSIS — M5432 Sciatica, left side: Secondary | ICD-10-CM | POA: Insufficient documentation

## 2018-02-27 DIAGNOSIS — I10 Essential (primary) hypertension: Secondary | ICD-10-CM | POA: Diagnosis not present

## 2018-02-27 DIAGNOSIS — Z79899 Other long term (current) drug therapy: Secondary | ICD-10-CM | POA: Diagnosis not present

## 2018-02-27 DIAGNOSIS — M549 Dorsalgia, unspecified: Secondary | ICD-10-CM | POA: Diagnosis present

## 2018-02-27 DIAGNOSIS — Z91040 Latex allergy status: Secondary | ICD-10-CM | POA: Diagnosis not present

## 2018-02-27 NOTE — ED Triage Notes (Signed)
The pt has had lower back pain for 6 weeks  She was told that she had a pinched nerve and that she needs surgery and is scheduled for surgery next month

## 2018-02-28 MED ORDER — LORAZEPAM 2 MG/ML IJ SOLN
1.0000 mg | Freq: Once | INTRAMUSCULAR | Status: AC
  Administered 2018-02-28: 1 mg via INTRAVENOUS
  Filled 2018-02-28: qty 1

## 2018-02-28 MED ORDER — OXYCODONE HCL 5 MG PO TABS: 5.0000 mg | ORAL_TABLET | ORAL | 0 refills | Status: DC | PRN

## 2018-02-28 MED ORDER — NAPROXEN 375 MG PO TABS: 375.0000 mg | ORAL_TABLET | Freq: Two times a day (BID) | ORAL | 0 refills | Status: DC

## 2018-02-28 MED ORDER — METHOCARBAMOL 500 MG PO TABS: 500.0000 mg | ORAL_TABLET | Freq: Four times a day (QID) | ORAL | 0 refills | Status: DC | PRN

## 2018-02-28 MED ORDER — METHOCARBAMOL 1000 MG/10ML IJ SOLN
1000.0000 mg | Freq: Once | INTRAVENOUS | Status: AC
  Administered 2018-02-28: 1000 mg via INTRAVENOUS
  Filled 2018-02-28: qty 10

## 2018-02-28 MED ORDER — MORPHINE SULFATE (PF) 4 MG/ML IV SOLN
4.0000 mg | Freq: Once | INTRAVENOUS | Status: AC
  Administered 2018-02-28: 4 mg via INTRAVENOUS
  Filled 2018-02-28: qty 1

## 2018-02-28 MED ORDER — KETOROLAC TROMETHAMINE 15 MG/ML IJ SOLN
15.0000 mg | Freq: Once | INTRAMUSCULAR | Status: AC
  Administered 2018-02-28: 15 mg via INTRAVENOUS
  Filled 2018-02-28: qty 1

## 2018-02-28 NOTE — Discharge Instructions (Addendum)
Apply ice to your lower back as needed. You may take acetaminophen to get additional pain relief. Return if pain is not being adequately controlled at home.

## 2018-02-28 NOTE — ED Notes (Signed)
Pharmacist notified pt.'s Robaxin order.

## 2018-02-28 NOTE — ED Provider Notes (Signed)
[name] EMERGENCY DEPARTMENT Provider Note   CSN: [id number] Arrival date & time: 02/27/18  2230     History   Chief Complaint Chief Complaint  Patient presents with  . Back Pain    HPI Ann Silva is a 72 y.o. female.  The history is provided by the patient.  She has history of hypertension, COPD, CHF, coronary artery disease, pulmonary embolism and comes in with pain in the left lumbar area radiating down her left leg for the past several weeks.  She has history of having had lumbar disc surgery and is scheduled for lumbar fusion in 1 month.  She has been taking oxycodone 5 mg tablets which are only giving her very slight and very temporary relief of pain.  She rates her pain a 10/10.  Nothing makes it better, nothing makes it worse.  She has chronic problems with urinary incontinence, but this has not changed.  There has been no new urinary or rectal complaints.  She denies any weakness, numbness, tingling.  Past Medical History:  Diagnosis Date  . Anemia    takes ferrous sulfate daily  . Anxiety   . Arthritis   . Bladder spasm   . Chronic back pain    spinal stenosis  . Colon cancer ([place])   . Complication of anesthesia    B/P bottoms out, hard to wake up   . Contact dermatitis   . COPD (chronic obstructive pulmonary disease) (HCC)    Albuterol inhaler and DuoNeb as needed  . Coronary artery disease    mild by 08/25/09 cath  . Headache   . Heart murmur    never had any problems  . History of blood transfusion    no abnormal reaction noted  . History of bronchitis   . History of MRSA infection 6+ yrs ago  . Hypertension    takes Metoprolol daily  . Insomnia    takes Trazodone nightly  . Joint pain   . Joint swelling   . Nocturia   . Panic attacks    takes Effexor daily  . Peripheral edema    takes Lasix daily and takes Bumex every 3 days.Takes Aldactone daily   . Restless leg syndrome    takes Requip daily  . RLS (restless legs  syndrome)   . Sleep apnea    doesnt use CPAP    Patient Active Problem List   Diagnosis Date Noted  . Dyspnea 12/29/2016  . Acute congestive heart failure ([place])   . HCAP (healthcare-associated pneumonia)   . Pulmonary embolus ([place])   . Lumbar adjacent segment disease with spondylolisthesis 12/25/2016  . COPD with acute exacerbation ([place]) 11/27/2015  . COPD exacerbation ([place]) 08/21/2015  . Restless legs 02/27/2015  . Hypertension   . Colon cancer ([place])   . Depression   . History of repair of hip joint 10/25/2013    Past Surgical History:  Procedure Laterality Date  . ABDOMINAL HYSTERECTOMY    . BACK SURGERY    . CHOLECYSTECTOMY    . COLON SURGERY     partial colectomy d/t cancer cells  . COLONOSCOPY    . I&D of left hip      x 4  . JOINT REPLACEMENT    . Resection total hip    . Revision of total hip arthroplasty     x3-4  . right hip replacement    . TOTAL HIP ARTHROPLASTY Left      OB History   None  Home Medications    Prior to Admission medications   Medication Sig Start Date End Date Taking? Authorizing Provider  albuterol (PROVENTIL HFA;VENTOLIN HFA) 108 (90 Base) MCG/ACT inhaler Inhale 2 puffs into the lungs every 4 (four) hours as needed for wheezing or shortness of breath. 12/05/15  Yes Wieting, Richard, MD  budesonide-formoterol [facility]) 160-4.5 MCG/ACT inhaler Inhale 2 puffs into the lungs 2 (two) times daily. Patient taking differently: Inhale 2 puffs into the lungs 2 (two) times daily as needed (for respiratory issues).  12/05/15  Yes Wieting, Richard, MD  fluticasone (FLONASE) 50 MCG/ACT nasal spray Place 1 spray into both nostrils daily as needed (for sinus issues).    Yes [provider]  furosemide (LASIX) 20 MG tablet Take 1 tablet (20 mg total) by mouth daily. Patient taking differently: Take 20 mg by mouth 2 (two) times daily.  04/29/16 02/28/26 Yes Lavonia Drafts, MD  ipratropium-albuterol (DUONEB) 0.5-2.5 (3) MG/3ML SOLN Take 3  mLs by nebulization every 6 (six) hours as needed (for wheezing/shortness of breath).   Yes [provider]  metoprolol succinate (TOPROL-XL) 50 MG 24 hr tablet Take 1 tablet (50 mg total) by mouth every evening. Take with or immediately following a meal. Patient taking differently: Take 50 mg by mouth at bedtime. Take with or immediately following a meal. 12/26/15  Yes Crissman, Jeannette How, MD  oxyCODONE-acetaminophen (PERCOCET) 5-325 MG tablet Take 1-2 tablets by mouth every 6 (six) hours as needed for severe pain. 12/23/17 12/23/18 Yes Eula Listen, MD  rOPINIRole (REQUIP) 3 MG tablet Take 1 tablet (3 mg total) by mouth 4 (four) times daily. 04/20/15  Yes Crissman, Jeannette How, MD  acetaminophen (TYLENOL) 500 MG tablet Take 1,000 mg by mouth every 6 (six) hours as needed (for pain/back pain.).    [provider]  apixaban (ELIQUIS) 2.5 MG TABS tablet Take 1 tablet (2.5 mg total) by mouth 2 (two) times daily. Patient taking differently: Take 5 mg by mouth 2 (two) times daily.  01/01/17   Diallo, Earna Coder, MD  bumetanide (BUMEX) 2 MG tablet Take 2 mg by mouth every 3 (three) days. 05/09/16   [provider]  Calcium Carbonate-Vit D-Min (CALTRATE 600+D PLUS PO) Take 1 tablet by mouth 2 (two) times daily.    [provider]  Cyanocobalamin (VITAMIN B-12) 2500 MCG SUBL Place 2,500 mcg under the tongue at bedtime.    [provider]  cyclobenzaprine (FLEXERIL) 10 MG tablet Take 1 tablet (10 mg total) by mouth 3 (three) times daily as needed for muscle spasms. 12/26/16   Newman Pies, MD  docusate sodium (COLACE) 100 MG capsule Take 1 capsule (100 mg total) by mouth 2 (two) times daily. Patient not taking: Reported on 12/29/2016 12/26/16   Newman Pies, MD  Emollient (CERAVE) CREA Apply 1 application topically 2 (two) times daily. Applied neck down for dermatitis    [provider]  gabapentin (NEURONTIN) 100 MG capsule Take 200 mg by mouth at  bedtime. 02/15/18   [provider]  ibuprofen (ADVIL,MOTRIN) 600 MG tablet Take 1 tablet (600 mg total) by mouth every 8 (eight) hours as needed for mild pain or moderate pain (with food). 12/23/17   Eula Listen, MD  ondansetron (ZOFRAN ODT) 4 MG disintegrating tablet Take 1 tablet (4 mg total) by mouth every 8 (eight) hours as needed for nausea or vomiting. Patient taking differently: Take 4 mg by mouth every 8 (eight) hours as needed for nausea or vomiting. DISSOLVE IN THE MOUTH 01/10/16  Volney American, PA-C  oxyCODONE (ROXICODONE) 5 MG immediate release tablet Take 1 tablet (5 mg total) by mouth every 8 (eight) hours as needed. 03/03/17 03/03/18  Lavonia Drafts, MD    Family History Family History  Problem Relation Age of Onset  . Cancer Mother   . Stroke Father   . Diabetes Sister   . Mental illness Sister     Social History Social History   Tobacco Use  . Smoking status: Former Smoker    Packs/day: 0.75    Years: 51.00    Pack years: 38.25    Types: Cigarettes    Start date: 07/08/1964  . Smokeless tobacco: Never Used  . Tobacco comment: quit 2 weeks ago  Substance Use Topics  . Alcohol use: No  . Drug use: No     Allergies   Hydrocodone-chlorpheniramine; Prednisone; Sulfa antibiotics; Chlorhexidine; Methadone; Benadryl [diphenhydramine]; Diphenhydramine-zinc acetate; Latex; and Tape   Review of Systems Review of Systems  All other systems reviewed and are negative.    Physical Exam Updated Vital Signs BP 140/84 (BP Location: Right Arm)   Pulse 69   Temp 98.5 F (36.9 C) (Oral)   Resp 20   Ht 5' (1.524 m)   Wt 78.9 kg   SpO2 98%   BMI 33.98 kg/m   Physical Exam  Nursing note and vitals reviewed.  72 year old female, resting comfortably and in no acute distress. Vital signs are normal. Oxygen saturation is 98%, which is normal. Head is normocephalic and atraumatic. PERRLA, EOMI. Oropharynx is clear. Neck is nontender and supple  without adenopathy or JVD. Back is mildly tender in the mid and lower lumbar area with moderate left paralumbar spasm and tenderness.  Straight leg raise is positive on the left at 45 degrees, crossed straight leg raise is positive on the right at 60 degrees.  There is no CVA tenderness. Lungs are clear without rales, wheezes, or rhonchi. Chest is nontender. Heart has regular rate and rhythm without murmur. Abdomen is soft, flat, nontender without masses or hepatosplenomegaly and peristalsis is normoactive. Extremities have 1+ edema, full range of motion is present. Skin is warm and dry without rash. Neurologic: Mental status is normal, cranial nerves are intact, there are no motor or sensory deficits.  ED Treatments / Results   Procedures Procedures   Medications Ordered in ED Medications  methocarbamol (ROBAXIN) 1,000 mg in dextrose 5 % 50 mL IVPB (0 mg Intravenous Stopped 02/28/18 0506)  ketorolac (TORADOL) 15 MG/ML injection 15 mg (15 mg Intravenous Given 02/28/18 0325)  morphine 4 MG/ML injection 4 mg (4 mg Intravenous Given 02/28/18 0325)  LORazepam (ATIVAN) injection 1 mg (1 mg Intravenous Given 02/28/18 0429)     Initial Impression / Assessment and Plan / ED Course  I have reviewed the triage vital signs and the nursing notes.  Left-sided sciatica.  Old records are reviewed confirming patient is scheduled for lumbar fusion in 1 month, MRI of the lumbar spine July 18 showed retrolisthesis L3-L4 with possible L3 neural impingement.  At this point, patient needs symptomatic relief.  No evidence of acute neurologic injury on exam.  She is given intravenous methocarbamol, ketorolac, morphine and will be reassessed.  She was doing reasonably well with above-noted medication, but was complaining of worsening restless leg symptoms.  She had taken a dose of her home Requip, so she was given a dose of lorazepam.  Following this, she was resting comfortably.  She is discharged with  prescriptions for  oxycodone, naproxen, methocarbamol.  She is to follow-up with her neurosurgeon.  Return precautions discussed.  Final Clinical Impressions(s) / ED Diagnoses   Final diagnoses:  Left sided sciatica    ED Discharge Orders         Ordered    oxyCODONE (ROXICODONE) 5 MG immediate release tablet  Every 4 hours PRN     02/28/18 0544    methocarbamol (ROBAXIN) 500 MG tablet  4 times daily PRN     02/28/18 0544    naproxen (NAPROSYN) 375 MG tablet  2 times daily     02/28/18 7619           Delora Fuel, MD 50/93/26 [phone]

## 2018-03-12 ENCOUNTER — Ambulatory Visit: Payer: Medicare HMO | Attending: Nurse Practitioner | Admitting: Nurse Practitioner

## 2018-03-12 ENCOUNTER — Encounter: Payer: Self-pay | Admitting: Nurse Practitioner

## 2018-03-12 ENCOUNTER — Other Ambulatory Visit: Payer: Self-pay

## 2018-03-12 VITALS — BP 107/59 | HR 55 | Temp 98.1°F | Resp 18 | Ht 60.0 in | Wt 174.0 lb

## 2018-03-12 DIAGNOSIS — G4733 Obstructive sleep apnea (adult) (pediatric): Secondary | ICD-10-CM | POA: Diagnosis not present

## 2018-03-12 DIAGNOSIS — Z79899 Other long term (current) drug therapy: Secondary | ICD-10-CM | POA: Insufficient documentation

## 2018-03-12 DIAGNOSIS — M5442 Lumbago with sciatica, left side: Secondary | ICD-10-CM | POA: Diagnosis not present

## 2018-03-12 DIAGNOSIS — Z720 Tobacco use: Secondary | ICD-10-CM | POA: Insufficient documentation

## 2018-03-12 DIAGNOSIS — Z9049 Acquired absence of other specified parts of digestive tract: Secondary | ICD-10-CM | POA: Diagnosis not present

## 2018-03-12 DIAGNOSIS — I509 Heart failure, unspecified: Secondary | ICD-10-CM | POA: Insufficient documentation

## 2018-03-12 DIAGNOSIS — M48061 Spinal stenosis, lumbar region without neurogenic claudication: Secondary | ICD-10-CM | POA: Insufficient documentation

## 2018-03-12 DIAGNOSIS — M899 Disorder of bone, unspecified: Secondary | ICD-10-CM | POA: Insufficient documentation

## 2018-03-12 DIAGNOSIS — Z79891 Long term (current) use of opiate analgesic: Secondary | ICD-10-CM | POA: Diagnosis not present

## 2018-03-12 DIAGNOSIS — J441 Chronic obstructive pulmonary disease with (acute) exacerbation: Secondary | ICD-10-CM | POA: Diagnosis not present

## 2018-03-12 DIAGNOSIS — Z96642 Presence of left artificial hip joint: Secondary | ICD-10-CM | POA: Insufficient documentation

## 2018-03-12 DIAGNOSIS — I110 Hypertensive heart disease with heart failure: Secondary | ICD-10-CM | POA: Insufficient documentation

## 2018-03-12 DIAGNOSIS — E210 Primary hyperparathyroidism: Secondary | ICD-10-CM | POA: Insufficient documentation

## 2018-03-12 DIAGNOSIS — G2581 Restless legs syndrome: Secondary | ICD-10-CM | POA: Insufficient documentation

## 2018-03-12 DIAGNOSIS — Z789 Other specified health status: Secondary | ICD-10-CM | POA: Insufficient documentation

## 2018-03-12 DIAGNOSIS — M79605 Pain in left leg: Secondary | ICD-10-CM

## 2018-03-12 DIAGNOSIS — Z85038 Personal history of other malignant neoplasm of large intestine: Secondary | ICD-10-CM | POA: Diagnosis not present

## 2018-03-12 DIAGNOSIS — G894 Chronic pain syndrome: Secondary | ICD-10-CM

## 2018-03-12 DIAGNOSIS — Z90710 Acquired absence of both cervix and uterus: Secondary | ICD-10-CM | POA: Insufficient documentation

## 2018-03-12 DIAGNOSIS — G8929 Other chronic pain: Secondary | ICD-10-CM | POA: Insufficient documentation

## 2018-03-12 NOTE — Progress Notes (Signed)
Safety precautions to be maintained throughout the outpatient stay will include: orient to surroundings, keep bed in low position, maintain call bell within reach at all times, provide assistance with transfer out of bed and ambulation.  

## 2018-03-12 NOTE — Patient Instructions (Signed)

## 2018-03-12 NOTE — Progress Notes (Signed)
Patient's Name: Ann Silva  MRN: [id number]  Referring Provider: Rusty Aus, MD  DOB: October 17, 1945  PCP: Rusty Aus, MD  DOS: 03/12/2018  Note by: Dionisio David NP  Service setting: Ambulatory outpatient  Specialty: Interventional Pain Management  Location: [facility] (AMB) Pain Management Facility    Patient type: New Patient    Primary Reason(s) for Visit: Initial Patient Evaluation CC: New Patient (Initial Visit)  HPI  Ann Silva is a 72 y.o. year old, female patient, who comes today for an initial evaluation. She has Colon cancer ([place]); Depression; History of total hip replacement, left; Hypertension; Restless legs; COPD exacerbation ([place]); COPD with acute exacerbation ([place]); Lumbar adjacent segment disease with spondylolisthesis; Dyspnea; Acute congestive heart failure ([place]); HCAP (healthcare-associated pneumonia); Pulmonary embolus ([place]); Anemia; Aortic atherosclerosis ([place]); B12 deficiency; Chronic contact dermatitis; History of colon cancer; Hyperparathyroidism, primary ([place]); Lymphedema of left leg; Medicare annual wellness visit, initial; Obstructive sleep apnea; Sedated due to medication; Spinal stenosis of lumbar region; Tobacco use disorder; S/P total hip arthroplasty; Benign essential hypertension; Restless legs syndrome; Acute saddle pulmonary embolism with acute cor pulmonale ([place]); Mild chronic obstructive pulmonary disease ([place]); Chronic bilateral low back pain with left-sided sciatica (Primary Area of Pain); Chronic pain of left lower extremity (Secondary Area of Pain); Chronic pain syndrome; Long term current use of opiate analgesic; Pharmacologic therapy; Disorder of skeletal system; and Problems influencing health status on their problem list.. Her primarily concern today is the New Patient (Initial Visit)  Pain Assessment: Location: Lower Back Radiating: Pain radiates from lower left back down to back and front of left leg down to ankle.  Onset: More than a month  ago Duration: Chronic pain Quality: Constant, Aching, Throbbing Severity: 7 /10 (subjective, self-reported pain score)  Note: Reported level is compatible with observation. Clinically the patient looks like a 1/10 A 1/10 is viewed as "Mild" and described as nagging, annoying, but not interfering with basic activities of daily living (ADL). Ann Silva is able to eat, bathe, get dressed, do toileting (being able to get on and off the toilet and perform personal hygiene functions), transfer (move in and out of bed or a chair without assistance), and maintain continence (able to control bladder and bowel functions). Physiologic parameters such as blood pressure and heart rate apear wnl. Information on the proper use of the pain scale provided to the patient today. When using our objective Pain Scale, levels between 6 and 10/10 are said to belong in an emergency room, as it progressively worsens from a 6/10, described as severely limiting, requiring emergency care not usually available at an outpatient pain management facility. At a 6/10 level, communication becomes difficult and requires great effort. Assistance to reach the emergency department may be required. Facial flushing and profuse sweating along with potentially dangerous increases in heart rate and blood pressure will be evident. Effect on ADL: "Everything I do takes much longer"  Timing: Constant Modifying factors: Medications, lying down, resting, sleeping, warm showers BP: (!) 107/59  HR: (!) 55  Onset and Duration: Gradual, Date of onset: 10/2017 and Present longer than 3 months Cause of pain: Unknown Severity: Getting worse, NAS-11 at its worse: 10/10, NAS-11 at its best: 5/10, NAS-11 now: 7/10 and NAS-11 on the average: 5/10 Timing: Not influenced by the time of the day, During activity or exercise, After activity or exercise and After a period of immobility Aggravating Factors: Bending, Climbing, Kneeling, Lifiting, Motion, Prolonged  sitting, Prolonged standing, Squatting, Stooping , Twisting, Walking, Walking  uphill, Walking downhill and Working Alleviating Factors: Lying down, Medications, Resting, Sleeping and Warm showers or baths Associated Problems: Fatigue, Inability to concentrate, Inability to control bladder (urine), Inability to control bowel, Spasms, Swelling, Weakness and Pain that wakes patient up Quality of Pain: Aching, Agonizing, Annoying, Constant, Disabling, Getting longer, Heavy, Pulsating, Throbbing and Uncomfortable Previous Examinations or Tests: CT scan, MRI scan, X-rays, Neurosurgical evaluation and Orthopedic evaluation Previous Treatments: Narcotic medications, Physical Therapy, Steroid treatments by mouth and TENS  The patient comes into the clinics today for the first time for a chronic pain management evaluation.  According to the patient her primary area pain is in her lower back.  She admits that is on the left side only.  She admits this pain has been going on for approximately 3 months.  He admits that she has been diagnosed with a pinched nerve.  She admits that she has had surgery approximately 1 year ago with Dr. Arnoldo Morale.  She has had 2 additional surgeries prior to this.  She denies having interventional therapy.  Denies any recent physical therapy however did have physical therapy after her surgery.  She has had images within the last 2 months.  Her second area of pain is in her leg.  She admits that it goes down the back of her left leg and wraps into the front of her ankle.  She describes as a throbbing and aching pain.  She denies any numbness or tingling.  She denies previous nerve conduction study.  Today I took the time to provide the patient with information regarding this pain practice. The patient was informed that the practice is divided into two sections: an interventional pain management section, as well as a completely separate and distinct medication management section. I explained  that there are procedure days for interventional therapies, and evaluation days for follow-ups and medication management. Because of the amount of documentation required during both, they are kept separated. This means that there is the possibility that she may be scheduled for a procedure on one day, and medication management the next. I have also informed her that because of staffing and facility limitations, this practice will no longer take patients for medication management only. To illustrate the reasons for this, I gave the patient the example of surgeons, and how inappropriate it would be to refer a patient to his/her care, just to write for the post-surgical antibiotics on a surgery done by a different surgeon.   Because interventional pain management is part of the board-certified specialty for the doctors, the patient was informed that joining this practice means that they are open to any and all interventional therapies. I made it clear that this does not mean that they will be forced to have any procedures done. What this means is that I believe interventional therapies to be essential part of the diagnosis and proper management of chronic pain conditions. Therefore, patients not interested in these interventional alternatives will be better served under the care of a different practitioner.  The patient was also made aware of my Comprehensive Pain Management Safety Guidelines where by joining this practice, they limit all of their nerve blocks and joint injections to those done by our practice, for as long as we are retained to manage their care. Historic Controlled Substance Pharmacotherapy Review  PMP and historical list of controlled substances: Oxycodone 5 mg, oxycodone/acetaminophen 5/325 mg, alprazolam 0.5 mg, hydromorphone 4 mg, tramadol 50 mg, hydrocodone/acetaminophen 10/325 mg, lorazepam 1 mg Highest opioid analgesic  regimen found: Hydrocodone/acetaminophen 10/325 mg 2 tablets every 4  hours for 9 days (fill date 06/09/2014) hydrocodone 110-120 milligrams per day Most recent opioid analgesic: Oxycodone 5 mg 1 tablet 5 times daily (fill date 02/28/2018) oxycodone 25 mg/day Current opioid analgesics: Oxycodone 5 mg 1 tablet 5 times daily (fill date 02/28/2018) oxycodone 25 mg/day Highest recorded MME/day: 110-120 mg/day MME/day: 37.5 mg/day Medications: The patient did not bring the medication(s) to the appointment, as requested in our "New Patient Package" Pharmacodynamics: Desired effects: Analgesia: The patient reports >50% benefit. Reported improvement in function: The patient reports medication allows her to accomplish basic ADLs. Clinically meaningful improvement in function (CMIF): Sustained CMIF goals met Perceived effectiveness: Described as relatively effective, allowing for increase in activities of daily living (ADL) Undesirable effects: Side-effects or Adverse reactions: None reported Historical Monitoring: The patient  reports that she does not use drugs. List of all UDS Test(s): No results found for: MDMA, COCAINSCRNUR, PCPSCRNUR, PCPQUANT, CANNABQUANT, THCU, [place] List of all Serum Drug Screening Test(s):  No results found for: AMPHSCRSER, BARBSCRSER, BENZOSCRSER, COCAINSCRSER, PCPSCRSER, PCPQUANT, THCSCRSER, CANNABQUANT, OPIATESCRSER, OXYSCRSER, PROPOXSCRSER Historical Background Evaluation: [place] PDMP: Six (6) year initial data search conducted.            NAR scores narcotic 400 7200 stimulant 0 overdose risk score 540  Department of public safety, offender search: Editor, commissioning Information) Non-contributory Risk Assessment Profile: Aberrant behavior: None observed or detected today Risk factors for fatal opioid overdose: None identified today Fatal overdose hazard ratio (HR): Calculation deferred Non-fatal overdose hazard ratio (HR): Calculation deferred Risk of opioid abuse or dependence: 0.7-3.0% with doses ? 36 MME/day and 6.1-26% with doses ? 120  MME/day. Substance use disorder (SUD) risk level: Pending results of Medical Psychology Evaluation for SUD Opioid risk tool (ORT) (Total Score): 2  ORT Scoring interpretation table:  Score <3 = Low Risk for SUD  Score between 4-7 = Moderate Risk for SUD  Score >8 = High Risk for Opioid Abuse   PHQ-2 Depression Scale:  Total score: 1  PHQ-2 Scoring interpretation table: (Score and probability of major depressive disorder)  Score 0 = No depression  Score 1 = 15.4% Probability  Score 2 = 21.1% Probability  Score 3 = 38.4% Probability  Score 4 = 45.5% Probability  Score 5 = 56.4% Probability  Score 6 = 78.6% Probability   PHQ-9 Depression Scale:  Total score: 1  PHQ-9 Scoring interpretation table:  Score 0-4 = No depression  Score 5-9 = Mild depression  Score 10-14 = Moderate depression  Score 15-19 = Moderately severe depression  Score 20-27 = Severe depression (2.4 times higher risk of SUD and 2.89 times higher risk of overuse)   Pharmacologic Plan: Pending ordered tests and/or consults  Meds  The patient has a current medication list which includes the following prescription(s): albuterol, budesonide-formoterol, fluticasone, furosemide, gabapentin, ipratropium-albuterol, methocarbamol, metoprolol succinate, naproxen, oxycodone, oxycodone-acetaminophen, paroxetine, and ropinirole.  Current Outpatient Medications on File Prior to Visit  Medication Sig  . albuterol (PROVENTIL HFA;VENTOLIN HFA) 108 (90 Base) MCG/ACT inhaler Inhale 2 puffs into the lungs every 4 (four) hours as needed for wheezing or shortness of breath.  . budesonide-formoterol (SYMBICORT) 160-4.5 MCG/ACT inhaler Inhale 2 puffs into the lungs 2 (two) times daily. (Patient taking differently: Inhale 2 puffs into the lungs 2 (two) times daily as needed (for respiratory issues). )  . fluticasone (FLONASE) 50 MCG/ACT nasal spray Place 1 spray into both nostrils daily as needed (for sinus issues).   . furosemide (  LASIX)  20 MG tablet Take 1 tablet (20 mg total) by mouth daily. (Patient taking differently: Take 20 mg by mouth 2 (two) times daily. )  . gabapentin (NEURONTIN) 100 MG capsule Take 200 mg by mouth at bedtime.  Marland Kitchen ipratropium-albuterol (DUONEB) 0.5-2.5 (3) MG/3ML SOLN Take 3 mLs by nebulization every 6 (six) hours as needed (for wheezing/shortness of breath).  . methocarbamol (ROBAXIN) 500 MG tablet Take 1-2 tablets (500-1,000 mg total) by mouth 4 (four) times daily as needed for muscle spasms.  . metoprolol succinate (TOPROL-XL) 50 MG 24 hr tablet Take 1 tablet (50 mg total) by mouth every evening. Take with or immediately following a meal. (Patient taking differently: Take 50 mg by mouth at bedtime. Take with or immediately following a meal.)  . naproxen (NAPROSYN) 375 MG tablet Take 1 tablet (375 mg total) by mouth 2 (two) times daily.  Marland Kitchen oxyCODONE (ROXICODONE) 5 MG immediate release tablet Take 1-2 tablets (5-10 mg total) by mouth every 4 (four) hours as needed.  Marland Kitchen oxyCODONE-acetaminophen (PERCOCET) 5-325 MG tablet Take 1-2 tablets by mouth every 6 (six) hours as needed for severe pain.  Marland Kitchen PARoxetine (PAXIL) 40 MG tablet Take 40 mg by mouth every morning.  Marland Kitchen rOPINIRole (REQUIP) 3 MG tablet Take 1 tablet (3 mg total) by mouth 4 (four) times daily.   No current facility-administered medications on file prior to visit.    Imaging Review  Lumbosacral Imaging:  Results for orders placed during the hospital encounter of 01/22/18  MR Lumbar Spine W Wo Contrast   Narrative CLINICAL DATA:  Lumbar surgery 1 year ago. Low back pain for 4-6 weeks. LEFT leg discomfort is reported.  EXAM: MRI LUMBAR SPINE WITHOUT AND WITH CONTRAST  TECHNIQUE: Multiplanar and multiecho pulse sequences of the lumbar spine were obtained without and with intravenous contrast.  CONTRAST:  65m MULTIHANCE GADOBENATE DIMEGLUMINE 529 MG/ML IV SOLN  COMPARISON:  03/03/2017 MRI.  Plain films 01/01/2018  FINDINGS: Segmentation:   Standard.  Alignment: The patient has been fused in a position of retrolisthesis at L4-5, this is estimated 4 mm. 4 mm retrolisthesis is also observed at L3-4 and 2 mm retrolisthesis L2-3. 2 mm anterolisthesis L5-S1, stable.  Vertebrae: Sacral insufficiency fractures have healed. Solid appearing arthrodesis L5-S1. Doubt solid arthrodesis L4-5. No features suggestive of discitis or osteomyelitis.  Conus medullaris and cauda equina: Conus extends to the L1 level. Conus and cauda equina appear normal.  Paraspinal and other soft tissues: Dorsal lumbar subcutaneous fluid collection is diminished from priors, likely benign postoperative seroma. No concerning features to suggest infection or CSF leak. Postcontrast soft tissue enhancement is non worrisome.  Disc levels:  L1-L2:  Shallow protrusion.  No impingement.  L2-L3: 2 mm retrolisthesis. Shallow protrusion. Facet arthropathy. No definite impingement.  L3-L4: 4 mm retrolisthesis. Uncovering of the disc. Central protrusion. Posterior element hypertrophy. Severe stenosis. BILATERAL L4 neural impingement. BILATERAL foraminal narrowing could affect either L3 nerve root.  L4-L5: 4 mm retrolisthesis. Residual disc material, projects into the canal, but obscured by susceptibility artifact from the hardware. Doubt residual subarticular zone narrowing. Moderate RIGHT foraminal narrowing.  L5-S1:  Solid arthrodesis.  No impingement.  IMPRESSION: Sacral insufficiency fractures have resolved.  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 Consider CT myelography for further evaluation. RIGHT foraminal stenosis remains likely at this location.  Severe adjacent segment disease L3-4, progressed from 2018. 4 mm retrolisthesis, central protrusion, and posterior element hypertrophy, with BILATERAL L4, possible L3 neural impingement.   Electronically Signed   By: JMadie Reno  Curnes M.D.   On: 01/22/2018 12:08    Lumbar DG 2-3 views:  Results for orders  placed during the hospital encounter of 12/25/16  DG Lumbar Spine 2-3 Views   Narrative CLINICAL DATA:  Intraoperative imaging for L4-5 laminectomy and fusion.  EXAM: DG C-ARM 61-120 MIN; LUMBAR SPINE - 2-3 VIEW  COMPARISON:  Plain films lumbar spine performed at [facility] 06/06/2016.  FINDINGS: Two fluoroscopic spot views of the lower lumbar spine are provided. Pedicle screws seen on the comparison exam and S1 have been removed. New pedicle screws are in place in L4. Interbody spacer is noted.  IMPRESSION: L4-5 laminectomy and fusion and removal of pedicle screws from S1. No acute abnormality.   Electronically Signed   By: Inge Rise M.D.   On: 12/25/2016 15:59     Hip Imaging:  Hip-R MR w/wo contrast:  Results for orders placed during the hospital encounter of 02/11/14  MR Hip Right W Wo Contrast   Narrative CLINICAL DATA:  Bilateral hip pain.  EXAM: MRI OF THE BILATERAL HIPS WITHOUT AND WITH CONTRAST  TECHNIQUE: Multiplanar, multisequence MR imaging was performed both before and after administration of intravenous contrast.  CONTRAST:  66m MULTIHANCE GADOBENATE DIMEGLUMINE 529 MG/ML IV SOLN  COMPARISON:  MRI left hip 01/02/2010  FINDINGS: There are severe degenerative changes involving the right hip markedly progressive since the prior MRI examination. Findings most suggestive of remote 80 an with femoral head collapse and subluxation of the femoral head laterally and superiorly. The acetabulum is widened and there is a large complex joint effusion with marked synovial enhancement and probable loose bodies in the joint. Could not exclude the possibility of septic arthritis. Joint aspiration would be helpful.  Surgical changes involving the left hip related to prior total hip arthroplasty. Significant artifact associated with the long stem prosthesis. There is a large fluid collection along the lateral aspect of the hip  measuring a maximum of 10 cm. This could be a chronic postoperative fluid collection as it is similar to the prior study from 2011. The other possibility is Pseudotumor/ metallosis. I do not see any worrisome rim enhancement to suggest this is an abscess.  The pubic symphysis and SI joints are intact. No pelvic fractures or findings suspicious for osteomyelitis. No significant intrapelvic abnormalities are demonstrated. No inguinal mass or adenopathy.  IMPRESSION: 1. Severely progressive destructive bony changes involving the right hip. This is most likely due to chronic avascular necrosis and secondary degenerative joint disease. Could not totally exclude the possibility of septic arthritis or inflammatory arthropathy. Joint aspiration may be helpful. 2. Chronic postoperative changes involving the left hip with a total left hip arthroplasty. There is a chronic appearing lateral fluid collection with a thick rim around it. This could be a chronic postoperative fluid collection as it is similar to the prior study from 2011. Other possibilities would include pseudotumor/ metallosis. I do not see any rim enhancement to suggest an abscess.   Electronically Signed   By: MKalman JewelsM.D.   On: 02/12/2014 09:45    Hip-L MR w/wo contrast:  Results for orders placed during the hospital encounter of 02/11/14  MR Hip Left W Wo Contrast   Narrative CLINICAL DATA:  Bilateral hip pain.  EXAM: MRI OF THE BILATERAL HIPS WITHOUT AND WITH CONTRAST  TECHNIQUE: Multiplanar, multisequence MR imaging was performed both before and after administration of intravenous contrast.  CONTRAST:  1101mMULTIHANCE GADOBENATE DIMEGLUMINE 529 MG/ML IV SOLN  COMPARISON:  MRI left hip 01/02/2010  FINDINGS: There are severe degenerative changes involving the right hip markedly progressive since the prior MRI examination. Findings most suggestive of remote 80 an with femoral head collapse  and subluxation of the femoral head laterally and superiorly. The acetabulum is widened and there is a large complex joint effusion with marked synovial enhancement and probable loose bodies in the joint. Could not exclude the possibility of septic arthritis. Joint aspiration would be helpful.  Surgical changes involving the left hip related to prior total hip arthroplasty. Significant artifact associated with the long stem prosthesis. There is a large fluid collection along the lateral aspect of the hip measuring a maximum of 10 cm. This could be a chronic postoperative fluid collection as it is similar to the prior study from 2011. The other possibility is Pseudotumor/ metallosis. I do not see any worrisome rim enhancement to suggest this is an abscess.  The pubic symphysis and SI joints are intact. No pelvic fractures or findings suspicious for osteomyelitis. No significant intrapelvic abnormalities are demonstrated. No inguinal mass or adenopathy.  IMPRESSION: 1. Severely progressive destructive bony changes involving the right hip. This is most likely due to chronic avascular necrosis and secondary degenerative joint disease. Could not totally exclude the possibility of septic arthritis or inflammatory arthropathy. Joint aspiration may be helpful. 2. Chronic postoperative changes involving the left hip with a total left hip arthroplasty. There is a chronic appearing lateral fluid collection with a thick rim around it. This could be a chronic postoperative fluid collection as it is similar to the prior study from 2011. Other possibilities would include pseudotumor/ metallosis. I do not see any rim enhancement to suggest an abscess.   Electronically Signed   By: Kalman Jewels M.D.   On: 02/12/2014 09:45    Hip-L CT w contrast:  Results for orders placed during the hospital encounter of 12/23/17  CT HIP LEFT W CONTRAST   Narrative CLINICAL DATA:  Left hip pain. Assess  for infection or fracture. History of prior left hip arthroplasty with subsequent revision due to orthopedic hardware recall. Patient feels same as prior to revision.  EXAM: CT OF THE LOWER LEFT EXTREMITY WITH CONTRAST  TECHNIQUE: Multidetector CT imaging of the lower left extremity was performed according to the standard protocol following intravenous contrast administration.  COMPARISON:  10/16/2011 CT, left hip radiographs 04/22/2016  CONTRAST:  134m ISOVUE-300 IOPAMIDOL (ISOVUE-300) INJECTION 61%  FINDINGS: Bones/Joint/Cartilage  Uncemented long-stem left hip arthroplasty with 3 cerclage wires encircling the left femoral shaft and femoral component of the hip arthroplasty are identified. Osteopenic appearance of the left femur. There are retained cerclage wire fragments within the soft tissues of the left thigh, 3 of which are noted laterally and 1 medially starting from the middle cerclage wire and just caudal to the distal-most encircling cerclage wire. No evidence of hardware failure or loosening. No significant joint effusion or joint dislocation. No fracture is identified.  Ligaments  Suboptimally assessed by CT.  Muscles and Tendons  No evidence of intramuscular hemorrhage or abscess. No significant atrophy.  Soft tissues  Scarring noted along the lateral aspect of the left thigh.  IMPRESSION: 1. Intact long-stem uncemented left hip arthroplasty without evidence of fracture or hardware failure. No loosening is identified. 2. Chronic stable retained cerclage fragments within the left thigh. No associated abscess is identified. Overlying left lateral soft tissue scarring.   Electronically Signed   By: DAshley RoyaltyM.D.   On: 12/23/2017 19:29  Results for orders placed during the hospital encounter of 03/14/15  DG HIP UNILAT WITH PELVIS 2-3 VIEWS RIGHT   Narrative CLINICAL DATA:  Pain following fall yesterday on concrete surface  EXAM: DG HIP  (WITH OR WITHOUT PELVIS) 2-3V RIGHT  COMPARISON:  May 01, 2010  FINDINGS: Frontal pelvis as well as frontal and lateral right hip images obtained. There are total hip prostheses bilaterally which appear well seated. There is heterotopic bone located in the lateral aspect of the right hip joint. No acute fracture or dislocation. There is postoperative change in the lower lumbar spine. Bones appear somewhat osteoporotic.  IMPRESSION: Total hip prostheses bilaterally which appear well seated. No acute fracture or dislocation. 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   Electronically Signed   By: Lowella Grip III M.D.   On: 03/14/2015 07:41    Hip-L DG 2-3 views:  Results for orders placed during the hospital encounter of 04/22/16  DG Hip Unilat W or Wo Pelvis 2-3 Views Left   Narrative CLINICAL DATA:  Status post fall, with left hip pain and limited range of motion. Initial encounter.  EXAM: DG HIP (WITH OR WITHOUT PELVIS) 2-3V LEFT  COMPARISON:  Pelvis radiographs performed 03/14/2015  FINDINGS: There is no evidence of fracture or dislocation at the left hip. The patient's bilateral hip total arthroplasties are grossly stable in appearance. One of the screws of the left hip arthroplasty extends into the left pelvic sidewall soft tissues. Left-sided cerclage wires are again noted, with suggestion of chronic underlying bony resorption at the proximal left femur. No periprosthetic fracture is identified.  Lumbosacral spinal fusion hardware is noted. The sacroiliac joints are grossly unremarkable. The visualized bowel gas pattern is within normal limits.  IMPRESSION: No evidence of fracture or dislocation. Bilateral hip total arthroplasties appear grossly stable. Suggestion of underlying chronic bony resorption at the proximal left femur.   Electronically Signed   By: Garald Balding M.D.   On: 04/22/2016 22:27   Knee  Imaging:  Knee-L DG 4 views:  Results for orders placed during the hospital encounter of 04/22/16  DG Knee Complete 4 Views Left   Narrative CLINICAL DATA:  Status post fall, with left knee pain. Initial encounter.  EXAM: LEFT KNEE - COMPLETE 4+ VIEW  COMPARISON:  Left femur radiographs performed 09/23/2011  FINDINGS: There is no evidence of fracture or dislocation. The joint spaces are preserved. No significant degenerative change is seen; the patellofemoral joint is grossly unremarkable in appearance.  No significant joint effusion is seen. Mild chondrocalcinosis is noted. The visualized soft tissues are normal in appearance.  IMPRESSION: 1. No evidence of fracture or dislocation. 2. Mild chondrocalcinosis noted.   Electronically Signed   By: Garald Balding M.D.   On: 04/22/2016 22:29    Note: Available results from prior imaging studies were reviewed.        ROS  Cardiovascular History: Daily Aspirin intake and High blood pressure Pulmonary or Respiratory History: Shortness of breath, Smoking and Snoring  Neurological History: Incontinence:  Urinary Review of Past Neurological Studies:  Results for orders placed or performed during the hospital encounter of 12/28/16  CT HEAD WO CONTRAST   Narrative   CLINICAL DATA:  Tremors  EXAM: CT HEAD WITHOUT CONTRAST  TECHNIQUE: Contiguous axial images were obtained from the base of the skull through the vertex without intravenous contrast.  COMPARISON:  08/13/2016  FINDINGS: Brain: Mild atrophic changes are noted. No findings to suggest acute hemorrhage, acute  infarction or space-occupying mass lesion are noted.  Vascular: No hyperdense vessel or unexpected calcification.  Skull: Normal. Negative for fracture or focal lesion.  Sinuses/Orbits: No acute finding.  Other: None.  IMPRESSION: Atrophic changes without acute abnormality.   Electronically Signed   By: Inez Catalina M.D.   On: 12/29/2016 16:56     Psychological-Psychiatric History: No reported psychological or psychiatric signs or symptoms such as difficulty sleeping, anxiety, depression, delusions or hallucinations (schizophrenial), mood swings (bipolar disorders) or suicidal ideations or attempts Gastrointestinal History: No reported gastrointestinal signs or symptoms such as vomiting or evacuating blood, reflux, heartburn, alternating episodes of diarrhea and constipation, inflamed or scarred liver, or pancreas or irrregular and/or infrequent bowel movements Genitourinary History: No reported renal or genitourinary signs or symptoms such as difficulty voiding or producing urine, peeing blood, non-functioning kidney, kidney stones, difficulty emptying the bladder, difficulty controlling the flow of urine, or chronic kidney disease Hematological History: No reported hematological signs or symptoms such as prolonged bleeding, low or poor functioning platelets, bruising or bleeding easily, hereditary bleeding problems, low energy levels due to low hemoglobin or being anemic Endocrine History: No reported endocrine signs or symptoms such as high or low blood sugar, rapid heart rate due to high thyroid levels, obesity or weight gain due to slow thyroid or thyroid disease Rheumatologic History: Joint aches and or swelling due to excess weight (Osteoarthritis) Musculoskeletal History: Negative for myasthenia gravis, muscular dystrophy, multiple sclerosis or malignant hyperthermia Work History: Retired  Allergies  Ann Silva is allergic to hydrocodone-chlorpheniramine; prednisone; sulfa antibiotics; chlorhexidine; methadone; benadryl [diphenhydramine]; diphenhydramine-zinc acetate; latex; and tape.  Laboratory Chemistry  Inflammation Markers Lab Results  Component Value Date   CRP 5.0 (H) 01/02/2010   ESRSEDRATE 21 07/25/2011   (CRP: Acute Phase) (ESR: Chronic Phase) Renal Function Markers Lab Results  Component Value Date   BUN 31 (H)  12/23/2017   CREATININE 0.78 12/23/2017   GFRAA >60 12/23/2017   GFRNONAA >60 12/23/2017   Hepatic Function Markers Lab Results  Component Value Date   AST 22 02/01/2017   ALT 13 (L) 02/01/2017   ALBUMIN 3.5 02/01/2017   ALKPHOS 155 (H) 02/01/2017   Electrolytes Lab Results  Component Value Date   NA 136 12/23/2017   K 4.3 12/23/2017   CL 104 12/23/2017   CALCIUM 10.5 (H) 12/23/2017   MG 2.0 11/29/2015   Neuropathy Markers Lab Results  Component Value Date   VITAMINB12 537 01/02/2010   Bone Pathology Markers Lab Results  Component Value Date   ALKPHOS 155 (H) 02/01/2017   CALCIUM 10.5 (H) 12/23/2017   Coagulation Parameters Lab Results  Component Value Date   INR 1.19 12/28/2016   LABPROT 15.2 12/28/2016   APTT 58.2 (H) 06/21/2014   PLT 194 12/23/2017   Cardiovascular Markers Lab Results  Component Value Date   BNP 396.1 (H) 12/29/2016   HGB 13.6 12/23/2017   HCT 39.7 12/23/2017   Note: Lab results reviewed.  PFSH  Drug: Ann Silva  reports that she does not use drugs. Alcohol:  reports that she does not drink alcohol. Tobacco:  reports that she has quit smoking. Her smoking use included cigarettes. She started smoking about 53 years ago. She has a 38.25 pack-year smoking history. She has never used smokeless tobacco. Medical:  has a past medical history of Anemia, Anxiety, Arthritis, Bladder spasm, Chronic back pain, Colon cancer ([place]), Complication of anesthesia, Contact dermatitis, COPD (chronic obstructive pulmonary disease) ([place]), Coronary artery disease, Headache, Heart murmur, History  of blood transfusion, History of bronchitis, History of MRSA infection (6+ yrs ago), Hypertension, Insomnia, Joint pain, Joint swelling, Nocturia, Panic attacks, Peripheral edema, Restless leg syndrome, RLS (restless legs syndrome), and Sleep apnea. Family: family history includes Cancer in her mother; Diabetes in her sister; Mental illness in her sister; Stroke in her  father.  Past Surgical History:  Procedure Laterality Date  . ABDOMINAL HYSTERECTOMY    . BACK SURGERY    . CHOLECYSTECTOMY    . COLON SURGERY     partial colectomy d/t cancer cells  . COLONOSCOPY    . I&D of left hip      x 4  . JOINT REPLACEMENT    . Resection total hip    . Revision of total hip arthroplasty     x3-4  . right hip replacement    . TOTAL HIP ARTHROPLASTY Left    Active Ambulatory Problems    Diagnosis Date Noted  . Colon cancer ([place])   . Depression   . History of total hip replacement, left 10/25/2013  . Hypertension   . Restless legs 02/27/2015  . COPD exacerbation ([place]) 08/21/2015  . COPD with acute exacerbation ([place]) 11/27/2015  . Lumbar adjacent segment disease with spondylolisthesis 12/25/2016  . Dyspnea 12/29/2016  . Acute congestive heart failure ([place])   . HCAP (healthcare-associated pneumonia)   . Pulmonary embolus ([place])   . Anemia 06/17/2014  . Aortic atherosclerosis ([place]) 08/14/2016  . B12 deficiency 05/15/2016  . Chronic contact dermatitis 05/01/2016  . History of colon cancer 05/01/2016  . Hyperparathyroidism, primary ([place]) 01/20/2018  . Lymphedema of left leg 08/05/2016  . Medicare annual wellness visit, initial 09/02/2017  . Obstructive sleep apnea 06/16/2014  . Sedated due to medication 06/17/2014  . Spinal stenosis of lumbar region 04/02/2015  . Tobacco use disorder 09/12/2014  . S/P total hip arthroplasty 10/25/2013  . Benign essential hypertension 06/16/2014  . Restless legs syndrome 06/16/2014  . Acute saddle pulmonary embolism with acute cor pulmonale ([place]) 02/20/2017  . Mild chronic obstructive pulmonary disease ([place]) 05/01/2016  . Chronic bilateral low back pain with left-sided sciatica (Primary Area of Pain) 03/12/2018  . Chronic pain of left lower extremity (Secondary Area of Pain) 03/12/2018  . Chronic pain syndrome 03/12/2018  . Long term current use of opiate analgesic 03/12/2018  . Pharmacologic therapy 03/12/2018  .  Disorder of skeletal system 03/12/2018  . Problems influencing health status 03/12/2018   Resolved Ambulatory Problems    Diagnosis Date Noted  . 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 MSSA (methicillin susceptible Staphylococcus aureus) infection   . 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 Infection and inflammatory reaction due to internal prosthetic device, implant, and graft ([place]) 10/30/2011  . Sinusitis, acute 08/14/2015   Past Medical History:  Diagnosis Date  . Anxiety   . Arthritis   . Bladder spasm   . Chronic back pain   . Complication of anesthesia   . Contact dermatitis   . COPD (chronic obstructive pulmonary disease) ([place])   . Coronary artery disease   . Headache   . Heart murmur   . History of blood transfusion   . History of bronchitis   . History of MRSA infection 6+ yrs ago  . Insomnia   . Joint pain   . Joint swelling   . Nocturia   . Panic attacks   . Peripheral edema   . Restless  leg syndrome   . RLS (restless legs syndrome)   . Sleep apnea    Constitutional Exam  General appearance: Well nourished, well developed, and well hydrated. In no apparent acute distress Vitals:   03/12/18 1117  BP: (!) 107/59  Pulse: (!) 55  Resp: 18  Temp: 98.1 F (36.7 C)  SpO2: 97%  Weight: 174 lb (78.9 kg)  Height: 5' (1.524 m)   BMI Assessment: Estimated body mass index is 33.98 kg/m as calculated from the following:   Height as of this encounter: 5' (1.524 m).   Weight as of this encounter: 174 lb (78.9 kg).  BMI interpretation table: BMI level Category Range association with higher incidence of chronic pain  <18 kg/m2 Underweight   18.5-24.9 kg/m2 Ideal body weight   25-29.9 kg/m2 Overweight Increased incidence by 20%  30-34.9 kg/m2 Obese (Class I) Increased incidence by 68%  35-39.9 kg/m2 Severe obesity (Class II) Increased incidence by 136%  >40 kg/m2 Extreme obesity (Class III) Increased  incidence by 254%   BMI Readings from Last 4 Encounters:  03/12/18 33.98 kg/m  02/27/18 33.98 kg/m  12/23/17 33.98 kg/m  03/03/17 37.37 kg/m   Wt Readings from Last 4 Encounters:  03/12/18 174 lb (78.9 kg)  02/27/18 174 lb (78.9 kg)  12/23/17 174 lb (78.9 kg)  03/03/17 185 lb (83.9 kg)  Psych/Mental status: Alert, oriented x 3 (person, place, & time)       Eyes: PERLA Respiratory: No evidence of acute respiratory distress  Cervical Spine Exam  Inspection: No masses, redness, or swelling Alignment: Symmetrical Functional ROM: Unrestricted ROM      Stability: No instability detected Muscle strength & Tone: Functionally intact Sensory: Unimpaired Palpation: No palpable anomalies              Upper Extremity (UE) Exam    Side: Right upper extremity  Side: Left upper extremity  Inspection: No masses, redness, swelling, or asymmetry. No contractures  Inspection: No masses, redness, swelling, or asymmetry. No contractures  Functional ROM: Unrestricted ROM          Functional ROM: Unrestricted ROM          Muscle strength & Tone: Functionally intact  Muscle strength & Tone: Functionally intact  Sensory: Unimpaired  Sensory: Unimpaired  Palpation: No palpable anomalies              Palpation: No palpable anomalies              Specialized Test(s): Deferred         Specialized Test(s): Deferred          Thoracic Spine Exam  Inspection: No masses, redness, or swelling Alignment: Symmetrical Functional ROM: Unrestricted ROM Stability: No instability detected Sensory: Unimpaired Muscle strength & Tone: No palpable anomalies  Lumbar Spine Exam  Inspection: Well healed scar from previous spine surgery detected Alignment: Symmetrical Functional ROM: Adequate ROM      Stability: No instability detected Muscle strength & Tone: Functionally intact Sensory: Unimpaired Palpation: Complains of area being tender to palpation       Provocative Tests: Lumbar Hyperextension and rotation  test: Non-contributory      Leg raises negative Patrick's Maneuver: Negative                  On the right unable to perform on the left  Gait & Posture Assessment  Ambulation: Limited Gait: High stepping gait (foot drop) Posture: WNL   Lower Extremity Exam    Side: Right  lower extremity  Side: Left lower extremity  Inspection: No masses, redness, swelling, or asymmetry. No contractures  Inspection: No masses, redness, swelling, or asymmetry. No contractures  Functional ROM: Unrestricted ROM          Functional ROM: Unrestricted ROM          Muscle strength & Tone: Functionally intact  Muscle strength & Tone: Functionally intact  Sensory: Unimpaired  Sensory: Unimpaired  Palpation: No palpable anomalies  Palpation: No palpable anomalies   Assessment  Primary Diagnosis & Pertinent Problem List: Diagnoses of Chronic bilateral low back pain with left-sided sciatica (Primary Area of Pain), Chronic pain of left lower extremity (Secondary Area of Pain), Chronic pain syndrome, Long term current use of opiate analgesic, Pharmacologic therapy, Disorder of skeletal system, and Problems influencing health status were pertinent to this visit.  Visit Diagnosis: 1. Chronic bilateral low back pain with left-sided sciatica (Primary Area of Pain)   2. Chronic pain of left lower extremity (Secondary Area of Pain)   3. Chronic pain syndrome   4. Long term current use of opiate analgesic   5. Pharmacologic therapy   6. Disorder of skeletal system   7. Problems influencing health status    Plan of Care  Initial treatment plan:  Please be advised that as per protocol, today's visit has been an evaluation only. We have not taken over the patient's controlled substance management.  Problem-specific plan: No problem-specific Assessment & Plan notes found for this encounter.  Ordered Lab-work, Procedure(s), Referral(s), & Consult(s): Orders Placed This Encounter  Procedures  . Compliance Drug Analysis,  Ur  . Comp. Metabolic Panel (12)  . Magnesium  . Vitamin B12  . Sedimentation rate  . 25-Hydroxyvitamin D Lcms D2+D3  . C-reactive protein   Pharmacotherapy: Medications ordered:  No orders of the defined types were placed in this encounter.  Medications administered during this visit: Shelie L. Falero had no medications administered during this visit.   Pharmacotherapy under consideration:  Opioid Analgesics: The patient was informed that there is no guarantee that she would be a candidate for opioid analgesics. The decision will be made following CDC guidelines. This decision will be based on the results of diagnostic studies, as well as Ms. Mcfarland's risk profile.  Membrane stabilizer: To be determined at a later time Muscle relaxant: To be determined at a later time NSAID: To be determined at a later time Other analgesic(s): To be determined at a later time   Interventional therapies under consideration: Ms. Rockers was informed that there is no guarantee that she would be a candidate for interventional therapies. The decision will be based on the results of diagnostic studies, as well as Ms. Shere's risk profile.  Possible procedure(s): Patient not interested in interventional therapy at this time   Provider-requested follow-up: Return for 2nd Visit, w/ Dr. Dossie Arbour.  Future Appointments  Date Time Provider [place]  03/24/2018  1:00 PM [facility] PAT 1 [facility] None    Primary Care Physician: Rusty Aus, MD Location: [facility] Note by:  Date: 03/12/2018; Time: 5:11 PM  Pain Score Disclaimer: We use the NRS-11 scale. This is a self-reported, subjective measurement of pain severity with only modest accuracy. It is used primarily to identify changes within a particular patient. It must be understood that outpatient pain scales are significantly less accurate that those used for research, where they can be applied under ideal  controlled circumstances with minimal exposure to variables. In reality, the score  is likely to be a combination of pain intensity and pain affect, where pain affect describes the degree of emotional arousal or changes in action readiness caused by the sensory experience of pain. Factors such as social and work situation, setting, emotional state, anxiety levels, expectation, and prior pain experience may influence pain perception and show large inter-individual differences that may also be affected by time variables.  Patient instructions provided during this appointment: Patient Instructions   ____________________________________________________________________________________________  Appointment Policy Summary  It is our goal and responsibility to provide the medical community with assistance in the evaluation and management of patients with chronic pain. Unfortunately our resources are limited. Because we do not have an unlimited amount of time, or available appointments, we are required to closely monitor and manage their use. The following rules exist to maximize their use:  Patient's responsibilities: 1. Punctuality:  At what time should I arrive? You should be physically present in our office 30 minutes before your scheduled appointment. Your scheduled appointment is with your assigned healthcare provider. However, it takes 5-10 minutes to be "checked-in", and another 15 minutes for the nurses to do the admission. If you arrive to our office at the time you were given for your appointment, you will end up being at least 20-25 minutes late to your appointment with the provider. 2. Tardiness:  What happens if I arrive only a few minutes after my scheduled appointment time? You will need to reschedule your appointment. The cutoff is your appointment time. This is why it is so important that you arrive at least 30 minutes before that appointment. If you have an appointment scheduled for 10:00 AM  and you arrive at 10:01, you will be required to reschedule your appointment.  3. Plan ahead:  Always assume that you will encounter traffic on your way in. Plan for it. If you are dependent on a driver, make sure they understand these rules and the need to arrive early. 4. Other appointments and responsibilities:  Avoid scheduling any other appointments before or after your pain clinic appointments.  5. Be prepared:  Write down everything that you need to discuss with your healthcare provider and give this information to the admitting nurse. Write down the medications that you will need refilled. Bring your pills and bottles (even the empty ones), to all of your appointments, except for those where a procedure is scheduled. 6. No children or pets:  Find someone to take care of them. It is not appropriate to bring them in. 7. Scheduling changes:  We request "advanced notification" of any changes or cancellations. 8. Advanced notification:  Defined as a time period of more than 24 hours prior to the originally scheduled appointment. This allows for the appointment to be offered to other patients. 9. Rescheduling:  When a visit is rescheduled, it will require the cancellation of the original appointment. For this reason they both fall within the category of "Cancellations".  10. Cancellations:  They require advanced notification. Any cancellation less than 24 hours before the  appointment will be recorded as a "No Show". 11. No Show:  Defined as an unkept appointment where the patient failed to notify or declare to the practice their intention or inability to keep the appointment.  Corrective process for repeat offenders:  1. Tardiness: Three (3) episodes of rescheduling due to late arrivals will be recorded as one (1) "No Show". 2. Cancellation or reschedule: Three (3) cancellations or rescheduling will be recorded as one (1) "No Show". 3. "  No Shows": Three (3) "No Shows" within a 12 month  period will result in discharge from the practice. ____________________________________________________________________________________________  ____________________________________________________________________________________________  Pain Scale  Introduction: The pain score used by this practice is the Verbal Numerical Rating Scale (VNRS-11). This is an 11-point scale. It is for adults and children 10 years or older. There are significant differences in how the pain score is reported, used, and applied. Forget everything you learned in the past and learn this scoring system.  General Information: The scale should reflect your current level of pain. Unless you are specifically asked for the level of your worst pain, or your average pain. If you are asked for one of these two, then it should be understood that it is over the past 24 hours.  Basic Activities of Daily Living (ADL): Personal hygiene, dressing, eating, transferring, and using restroom.  Instructions: Most patients tend to report their level of pain as a combination of two factors, their physical pain and their psychosocial pain. This last one is also known as "suffering" and it is reflection of how physical pain affects you socially and psychologically. From now on, report them separately. From this point on, when asked to report your pain level, report only your physical pain. Use the following table for reference.  Pain Clinic Pain Levels (0-5/10)  Pain Level Score  Description  No Pain 0   Mild pain 1 Nagging, annoying, but does not interfere with basic activities of daily living (ADL). Patients are able to eat, bathe, get dressed, toileting (being able to get on and off the toilet and perform personal hygiene functions), transfer (move in and out of bed or a chair without assistance), and maintain continence (able to control bladder and bowel functions). Blood pressure and heart rate are unaffected. A normal heart rate for a  healthy adult ranges from 60 to 100 bpm (beats per minute).   Mild to moderate pain 2 Noticeable and distracting. Impossible to hide from other people. More frequent flare-ups. Still possible to adapt and function close to normal. It can be very annoying and may have occasional stronger flare-ups. With discipline, patients may get used to it and adapt.   Moderate pain 3 Interferes significantly with activities of daily living (ADL). It becomes difficult to feed, bathe, get dressed, get on and off the toilet or to perform personal hygiene functions. Difficult to get in and out of bed or a chair without assistance. Very distracting. With effort, it can be ignored when deeply involved in activities.   Moderately severe pain 4 Impossible to ignore for more than a few minutes. With effort, patients may still be able to manage work or participate in some social activities. Very difficult to concentrate. Signs of autonomic nervous system discharge are evident: dilated pupils (mydriasis); mild sweating (diaphoresis); sleep interference. Heart rate becomes elevated (>115 bpm). Diastolic blood pressure (lower number) rises above 100 mmHg. Patients find relief in laying down and not moving.   Severe pain 5 Intense and extremely unpleasant. Associated with frowning face and frequent crying. Pain overwhelms the senses.  Ability to do any activity or maintain social relationships becomes significantly limited. Conversation becomes difficult. Pacing back and forth is common, as getting into a comfortable position is nearly impossible. Pain wakes you up from deep sleep. Physical signs will be obvious: pupillary dilation; increased sweating; goosebumps; brisk reflexes; cold, clammy hands and feet; nausea, vomiting or dry heaves; loss of appetite; significant sleep disturbance with inability to fall asleep or to  remain asleep. When persistent, significant weight loss is observed due to the complete loss of appetite and sleep  deprivation.  Blood pressure and heart rate becomes significantly elevated. Caution: If elevated blood pressure triggers a pounding headache associated with blurred vision, then the patient should immediately seek attention at an urgent or emergency care unit, as these may be signs of an impending stroke.    Emergency Department Pain Levels (6-10/10)  Emergency Room Pain 6 Severely limiting. Requires emergency care and should not be seen or managed at an outpatient pain management facility. Communication becomes difficult and requires great effort. Assistance to reach the emergency department may be required. Facial flushing and profuse sweating along with potentially dangerous increases in heart rate and blood pressure will be evident.   Distressing pain 7 Self-care is very difficult. Assistance is required to transport, or use restroom. Assistance to reach the emergency department will be required. Tasks requiring coordination, such as bathing and getting dressed become very difficult.   Disabling pain 8 Self-care is no longer possible. At this level, pain is disabling. The individual is unable to do even the most "basic" activities such as walking, eating, bathing, dressing, transferring to a bed, or toileting. Fine motor skills are lost. It is difficult to think clearly.   Incapacitating pain 9 Pain becomes incapacitating. Thought processing is no longer possible. Difficult to remember your own name. Control of movement and coordination are lost.   The worst pain imaginable 10 At this level, most patients pass out from pain. When this level is reached, collapse of the autonomic nervous system occurs, leading to a sudden drop in blood pressure and heart rate. This in turn results in a temporary and dramatic drop in blood flow to the brain, leading to a loss of consciousness. Fainting is one of the body's self defense mechanisms. Passing out puts the brain in a calmed state and causes it to shut down  for a while, in order to begin the healing process.    Summary: 1. Refer to this scale when providing [place] with your pain level. 2. Be accurate and careful when reporting your pain level. This will help with your care. 3. Over-reporting your pain level will lead to loss of credibility. 4. Even a level of 1/10 means that there is pain and will be treated at our facility. 5. High, inaccurate reporting will be documented as "Symptom Exaggeration", leading to loss of credibility and suspicions of possible secondary gains such as obtaining more narcotics, or wanting to appear disabled, for fraudulent reasons. 6. Only pain levels of 5 or below will be seen at our facility. 7. Pain levels of 6 and above will be sent to the Emergency Department and the appointment cancelled. ____________________________________________________________________________________________

## 2018-03-16 ENCOUNTER — Other Ambulatory Visit: Payer: Self-pay | Admitting: Neurosurgery

## 2018-03-23 NOTE — Pre-Procedure Instructions (Signed)
Ann Silva  03/23/2018      [id number] [address], [place] [place] [address] Phone: [phone] Fax: [phone]    Your procedure is scheduled on Sept. 26  Report to [facility] at 5:30 A.M.  Call this number if you have problems the morning of surgery:  [phone]   Remember:  Do not eat or drink after midnight.      Take these medicines the morning of surgery with A SIP OF WATER :              Tylenol if needed              Albuterol inhaler if needed-bring to hospital              symbicort-bring to hospital              flonase nasal spray if needed              Gabapentin(neurontin)              duoneb if needed              Methocarbamol(robaxin)              Metoprolol(toprol-xl)              Oxycodone if needed              Ropinirole(requip)              7 days prior to surgery STOP taking any Aspirin(unless otherwise instructed by your surgeon), Aleve, Naproxen, Ibuprofen, Motrin, Advil, Goody's, BC's, all herbal medications, fish oil, and all vitamins             Follow your surgeon's instructions on when to stop Asprin.  If no instructions were given by your surgeon then you will need to call the office to get those instructions.                    Do not wear jewelry, make-up or nail polish.  Do not wear lotions, powders, or perfumes, or deodorant.  Do not shave 48 hours prior to surgery.  Men may shave face and neck.  Do not bring valuables to the hospital.  [facility] is not responsible for any belongings or valuables.  Contacts, dentures or bridgework may not be worn into surgery.  Leave your suitcase in the car.  After surgery it may be brought to your room.  For patients admitted to the hospital, discharge time will be determined by your treatment team.  Patients discharged the day of surgery will not be allowed to drive home.    Special instructions:  [facility]-  Preparing For Surgery  Before surgery, you can play an important role. Because skin is not sterile, your skin needs to be as free of germs as possible. You can reduce the number of germs on your skin by washing with CHG (chlorahexidine gluconate) Soap before surgery.  CHG is an antiseptic cleaner which kills germs and bonds with the skin to continue killing germs even after washing.    Oral Hygiene is also important to reduce your risk of infection.  Remember - BRUSH YOUR TEETH THE MORNING OF SURGERY WITH YOUR REGULAR TOOTHPASTE  Please do not use if you have an allergy to CHG or antibacterial soaps. If your skin becomes reddened/irritated stop using the CHG.  Do not shave (including  legs and underarms) for at least 48 hours prior to first CHG shower. It is OK to shave your face.  Please follow these instructions carefully.   1. Shower the NIGHT BEFORE SURGERY and the MORNING OF SURGERY with CHG.   2. If you chose to wash your hair, wash your hair first as usual with your normal shampoo.  3. After you shampoo, rinse your hair and body thoroughly to remove the shampoo.  4. Use CHG as you would any other liquid soap. You can apply CHG directly to the skin and wash gently with a scrungie or a clean washcloth.   5. Apply the CHG Soap to your body ONLY FROM THE NECK DOWN.  Do not use on open wounds or open sores. Avoid contact with your eyes, ears, mouth and genitals (private parts). Wash Face and genitals (private parts)  with your normal soap.  6. Wash thoroughly, paying special attention to the area where your surgery will be performed.  7. Thoroughly rinse your body with warm water from the neck down.  8. DO NOT shower/wash with your normal soap after using and rinsing off the CHG Soap.  9. Pat yourself dry with a CLEAN TOWEL.  10. Wear CLEAN PAJAMAS to bed the night before surgery, wear comfortable clothes the morning of surgery  11. Place CLEAN SHEETS on your bed the night of your  first shower and DO NOT SLEEP WITH PETS.    Day of Surgery:  Do not apply any deodorants/lotions.  Please wear clean clothes to the hospital/surgery center.   Remember to brush your teeth WITH YOUR REGULAR TOOTHPASTE.    Please read over the following fact sheets that you were given. Coughing and Deep Breathing, MRSA Information and Surgical Site Infection Prevention

## 2018-03-24 ENCOUNTER — Encounter (HOSPITAL_COMMUNITY)
Admission: RE | Admit: 2018-03-24 | Discharge: 2018-03-24 | Disposition: A | Discharge status: Home / Self Care | Payer: Medicare HMO | Source: Ambulatory Visit | Attending: Neurosurgery | Admitting: Neurosurgery

## 2018-03-24 ENCOUNTER — Encounter (HOSPITAL_COMMUNITY): Payer: Self-pay

## 2018-03-24 ENCOUNTER — Other Ambulatory Visit: Payer: Self-pay

## 2018-03-24 DIAGNOSIS — Z7951 Long term (current) use of inhaled steroids: Secondary | ICD-10-CM | POA: Insufficient documentation

## 2018-03-24 DIAGNOSIS — Z981 Arthrodesis status: Secondary | ICD-10-CM | POA: Diagnosis not present

## 2018-03-24 DIAGNOSIS — Z01812 Encounter for preprocedural laboratory examination: Secondary | ICD-10-CM | POA: Insufficient documentation

## 2018-03-24 DIAGNOSIS — Z87891 Personal history of nicotine dependence: Secondary | ICD-10-CM | POA: Insufficient documentation

## 2018-03-24 DIAGNOSIS — G2581 Restless legs syndrome: Secondary | ICD-10-CM | POA: Insufficient documentation

## 2018-03-24 DIAGNOSIS — I10 Essential (primary) hypertension: Secondary | ICD-10-CM | POA: Diagnosis not present

## 2018-03-24 DIAGNOSIS — J449 Chronic obstructive pulmonary disease, unspecified: Secondary | ICD-10-CM | POA: Insufficient documentation

## 2018-03-24 DIAGNOSIS — G4733 Obstructive sleep apnea (adult) (pediatric): Secondary | ICD-10-CM | POA: Insufficient documentation

## 2018-03-24 DIAGNOSIS — F419 Anxiety disorder, unspecified: Secondary | ICD-10-CM | POA: Insufficient documentation

## 2018-03-24 DIAGNOSIS — Z7982 Long term (current) use of aspirin: Secondary | ICD-10-CM | POA: Insufficient documentation

## 2018-03-24 DIAGNOSIS — M4316 Spondylolisthesis, lumbar region: Secondary | ICD-10-CM | POA: Diagnosis not present

## 2018-03-24 DIAGNOSIS — Z79899 Other long term (current) drug therapy: Secondary | ICD-10-CM | POA: Insufficient documentation

## 2018-03-24 DIAGNOSIS — Z96641 Presence of right artificial hip joint: Secondary | ICD-10-CM | POA: Diagnosis not present

## 2018-03-24 DIAGNOSIS — I2510 Atherosclerotic heart disease of native coronary artery without angina pectoris: Secondary | ICD-10-CM | POA: Diagnosis not present

## 2018-03-24 HISTORY — DX: Foot drop, left foot: M21.372

## 2018-03-24 HISTORY — DX: Other pulmonary embolism without acute cor pulmonale: I26.99

## 2018-03-24 LAB — SURGICAL PCR SCREEN
MRSA, PCR: NEGATIVE
Staphylococcus aureus: NEGATIVE

## 2018-03-24 LAB — BASIC METABOLIC PANEL
Anion gap: 8 (ref 5–15)
BUN: 25 mg/dL — ABNORMAL HIGH (ref 8–23)
CHLORIDE: 105 mmol/L (ref 98–111)
CO2: 26 mmol/L (ref 22–32)
Calcium: 10.5 mg/dL — ABNORMAL HIGH (ref 8.9–10.3)
Creatinine, Ser: 0.97 mg/dL (ref 0.44–1.00)
GFR calc Af Amer: >60 mL/min (ref >60)
GFR calc non Af Amer: 57 mL/min — ABNORMAL LOW (ref >60)
Glucose, Bld: 85 mg/dL (ref 70–99)
Potassium: 4.9 mmol/L (ref 3.5–5.1)
SODIUM: 139 mmol/L (ref 135–145)

## 2018-03-24 LAB — TYPE AND SCREEN
ABO/RH(D): A POS
Antibody Screen: NEGATIVE

## 2018-03-24 LAB — CBC
HCT: 42.6 % (ref 36.0–46.0)
Hemoglobin: 13.8 g/dL (ref 12.0–15.0)
MCH: 30.7 pg (ref 26.0–34.0)
MCHC: 32.4 g/dL (ref 30.0–36.0)
MCV: 94.9 fL (ref 78.0–100.0)
Platelets: 170 10*3/uL (ref 150–400)
RBC: 4.49 MIL/uL (ref 3.87–5.11)
RDW: 14.5 % (ref 11.5–15.5)
WBC: 6.9 10*3/uL (ref 4.0–10.5)

## 2018-03-24 NOTE — Pre-Procedure Instructions (Signed)
Ann Silva  03/24/2018      [id number] [address], [place] [address] Phone: [phone] Fax: [phone]    Your procedure is scheduled on Sept. 26  Report to [facility] at 5:30 A.M.  Call this number if you have problems the morning of surgery:  [phone]   Remember:  Do not eat or drink after midnight.      Take these medicines the morning of surgery with A SIP OF WATER :              Tylenol if needed              Albuterol inhaler if needed-bring to hospital              symbicort-bring to hospital              flonase nasal spray if needed              duoneb if needed              Methocarbamol(robaxin)              Ropinirole(requip)              7 days prior to surgery STOP taking any Aspirin(unless otherwise instructed by your surgeon), Aleve, Naproxen, Ibuprofen, Motrin, Advil, Goody's, BC's, all herbal medications, fish oil, and all vitamins             Follow your surgeon's instructions on when to stop Asprin.  If no instructions were given by your surgeon then you will need to call the office to get those instructions.                    Do not wear jewelry, make-up or nail polish.  Do not wear lotions, powders, or perfumes, or deodorant.  Do not shave 48 hours prior to surgery.  Men may shave face and neck.  Do not bring valuables to the hospital.  [facility] is not responsible for any belongings or valuables.  Contacts, dentures or bridgework may not be worn into surgery.  Leave your suitcase in the car.  After surgery it may be brought to your room.  For patients admitted to the hospital, discharge time will be determined by your treatment team.  Patients discharged the day of surgery will not be allowed to drive home.    Special instructions:  [place]- Preparing For Surgery  Before surgery, you can play an important role. Because skin is not sterile, your  skin needs to be as free of germs as possible. You can reduce the number of germs on your skin by washing with CHG (chlorahexidine gluconate) Soap before surgery.  CHG is an antiseptic cleaner which kills germs and bonds with the skin to continue killing germs even after washing.    Oral Hygiene is also important to reduce your risk of infection.  Remember - BRUSH YOUR TEETH THE MORNING OF SURGERY WITH YOUR REGULAR TOOTHPASTE  Please do not use if you have an allergy to CHG or antibacterial soaps. If your skin becomes reddened/irritated stop using the CHG.  Do not shave (including legs and underarms) for at least 48 hours prior to first CHG shower. It is OK to shave your face.  Please follow these instructions carefully.   1. Shower the NIGHT BEFORE SURGERY and the MORNING OF SURGERY with CHG.   2.  If you chose to wash your hair, wash your hair first as usual with your normal shampoo.  3. After you shampoo, rinse your hair and body thoroughly to remove the shampoo.  4. Use CHG as you would any other liquid soap. You can apply CHG directly to the skin and wash gently with a scrungie or a clean washcloth.   5. Apply the CHG Soap to your body ONLY FROM THE NECK DOWN.  Do not use on open wounds or open sores. Avoid contact with your eyes, ears, mouth and genitals (private parts). Wash Face and genitals (private parts)  with your normal soap.  6. Wash thoroughly, paying special attention to the area where your surgery will be performed.  7. Thoroughly rinse your body with warm water from the neck down.  8. DO NOT shower/wash with your normal soap after using and rinsing off the CHG Soap.  9. Pat yourself dry with a CLEAN TOWEL.  10. Wear CLEAN PAJAMAS to bed the night before surgery, wear comfortable clothes the morning of surgery  11. Place CLEAN SHEETS on your bed the night of your first shower and DO NOT SLEEP WITH PETS.    Day of Surgery:  Do not apply any deodorants/lotions.   Please wear clean clothes to the hospital/surgery center.   Remember to brush your teeth WITH YOUR REGULAR TOOTHPASTE.    Please read over the following fact sheets that you were given. Coughing and Deep Breathing, MRSA Information and Surgical Site Infection Prevention

## 2018-03-24 NOTE — Progress Notes (Addendum)
PCP - Dr. Emily Filbert Cardiologist - denies  Chest x-ray - N/A EKG - 03/12/2018 Stress Test - 10+ years ago ECHO - 12/20/17 Cardiac Cath - 10+ years ago  Sleep Study - positive study in the past.  CPAP - denies use.   Aspirin Instructions: to stop 7 days prior to surgery. LD to be 03/26/18.  Labs: CBC, BMP, T/S PCR. Pt brought in copy of her EKG tracing from PCP. Paper copy in chart.   Anesthesia review: Yes, hx of CAD  Patient denies shortness of breath, fever, cough and chest pain at PAT appointment   Patient verbalized understanding of instructions that were given to them at the PAT appointment. Patient was also instructed that they will need to review over the PAT instructions again at home before surgery.

## 2018-03-25 NOTE — Progress Notes (Signed)
Anesthesia Chart Review:  Case:  [id number] Date/Time:  04/02/18 0715   Procedure:  POSTERIOR LUMBAR INTERBODY FUSION, INTERBODY PROSTHESIS, POSTERIOR INSTRUMENTATION/FUSION LUMBAR 3- LUMBAR 4; EXPLORE FUSION (N/A ) - POSTERIOR LUMBAR INTERBODY FUSION, INTERBODY PROSTHESIS, POSTERIOR INSTRUMENTATION/FUSION LUMBAR 3- LUMBAR 4; EXPLORE FUSION   Anesthesia type:  General   Pre-op diagnosis:  LUMBAR ADJACENT SEGMENT DISEASE WITH SPONDYLOLISTHESIS   Location:  [facility] OR ROOM 19 / [facility]   Surgeon:  Newman Pies, MD      DISCUSSION: 72 yo female former smoker for above procedure. Pertinent hx includes COPD, Anxiety, Anxiety, HTN, Peripheral Edema, Anemia, CAD (mild by 2011 cath), OSA on CPAP, Complication of anesthesia "BP bottoms out, hard to wake up", PE 2018 s/p lumbar PSF 12/25/2016.  Pt had lumbar fusion 3/64/6803 complicated by postop DVT/PE. She was placed on Eliquis but this was dc'd after 2.5 months due to recurrent epistaxis and she was continued on ASA 81mg  daily.  Pt has preop clearance from PCP 03/12/2018 stating "Lumbar disc disease-upcoming surgery 9/26. Patient with recent history last year of postop pulmonary embolism, will need special care and treatment for that. EKG shows normal sinus rhythm without ischemic changes, low surgical risk COPD stable"  Anticipate she can proceed as planned barring acute status change.  VS: BP 123/76   Pulse (!) 56   Temp 36.7 C   Resp 18   Ht 5' (1.524 m)   Wt 80.2 kg   SpO2 100%   BMI 34.51 kg/m   PROVIDERS: Rusty Aus, MD is PCP   LABS: Labs reviewed: Acceptable for surgery. (all labs ordered are listed, but only abnormal results are displayed)  Labs Reviewed  BASIC METABOLIC PANEL - Abnormal; Notable for the following components:      Result Value   BUN 25 (*)    Calcium 10.5 (*)    GFR calc non Af Amer 57 (*)    All other components within normal limits  SURGICAL PCR SCREEN  CBC  TYPE AND SCREEN     IMAGES: CTA  02/01/2017: IMPRESSION: 1. No pulmonary embolus or other acute thoracic abnormality. 2. Improved aeration of the left upper lobe. Persistent right upper lobe opacities, possibly atelectasis. 3.  Aortic Atherosclerosis (ICD10-I70.0).  EKG: 03/12/2018 (outside record, copy on pt chart): Sinus bradycardia (56bpm).  CV: TTE 12/30/2016: Study Conclusions  - Left ventricle: The cavity size was normal. Wall thickness was   increased in a pattern of mild LVH. Systolic function was normal.   The estimated ejection fraction was in the range of 55% to 60%.   Wall motion was normal; there were no regional wall motion   abnormalities. Left ventricular diastolic function parameters   were normal. - Left atrium: The atrium was mildly dilated. - Right atrium: The atrium was mildly dilated.  Cardiac cath 08/25/09:  - mid LAD 10% stenosis - mid CX 20% stenosis  Past Medical History:  Diagnosis Date  . Anemia    takes ferrous sulfate daily  . Anxiety   . Arthritis   . Bladder spasm   . Chronic back pain    spinal stenosis  . Colon cancer ([place])   . Complication of anesthesia    B/P bottoms out, hard to wake up   . Contact dermatitis   . COPD (chronic obstructive pulmonary disease) ([facility])    Albuterol inhaler and DuoNeb as needed  . Coronary artery disease    mild by 08/25/09 cath  . Foot drop, left   .  Headache   . Heart murmur    never had any problems  . History of blood transfusion    no abnormal reaction noted  . History of bronchitis   . History of MRSA infection 6+ yrs ago  . Hypertension    takes Metoprolol daily  . Insomnia    takes Trazodone nightly  . Joint pain   . Joint swelling   . Nocturia   . Panic attacks    takes Effexor daily  . Peripheral edema    takes Lasix daily and takes Bumex every 3 days.Takes Aldactone daily   . Pulmonary emboli ([place]) 2018  . Restless leg syndrome    takes Requip daily  . RLS (restless legs syndrome)   . Sleep apnea    doesnt use  CPAP    Past Surgical History:  Procedure Laterality Date  . ABDOMINAL HYSTERECTOMY    . BACK SURGERY    . CHOLECYSTECTOMY    . COLON SURGERY     partial colectomy d/t cancer cells  . COLONOSCOPY    . I&D of left hip      x 4  . JOINT REPLACEMENT    . Resection total hip    . Revision of total hip arthroplasty     x3-4  . right hip replacement    . TOTAL HIP ARTHROPLASTY Left     MEDICATIONS: . acetaminophen (TYLENOL) 500 MG tablet  . albuterol (PROVENTIL HFA;VENTOLIN HFA) 108 (90 Base) MCG/ACT inhaler  . aspirin EC 81 MG tablet  . budesonide-formoterol (SYMBICORT) 160-4.5 MCG/ACT inhaler  . fluticasone (FLONASE) 50 MCG/ACT nasal spray  . furosemide (LASIX) 20 MG tablet  . gabapentin (NEURONTIN) 100 MG capsule  . ipratropium-albuterol (DUONEB) 0.5-2.5 (3) MG/3ML SOLN  . methocarbamol (ROBAXIN) 500 MG tablet  . metoprolol succinate (TOPROL-XL) 50 MG 24 hr tablet  . naproxen (NAPROSYN) 375 MG tablet  . naproxen (NAPROXEN DR) 500 MG EC tablet  . oxyCODONE (ROXICODONE) 5 MG immediate release tablet  . oxyCODONE-acetaminophen (PERCOCET) 5-325 MG tablet  . PARoxetine (PAXIL) 40 MG tablet  . rOPINIRole (REQUIP) 3 MG tablet  . spironolactone (ALDACTONE) 25 MG tablet   No current facility-administered medications for this encounter.      Wynonia Musty [facility] Short Stay Center/Anesthesiology Phone [phone] 03/25/2018 11:49 AM

## 2018-04-01 NOTE — Anesthesia Preprocedure Evaluation (Signed)
Anesthesia Evaluation  Patient identified by MRN, date of birth, ID band Patient awake    Reviewed: Allergy & Precautions, NPO status , Patient's Chart, lab work & pertinent test results, reviewed documented beta blocker date and time   History of Anesthesia Complications (+) history of anesthetic complications  Airway Mallampati: III   Neck ROM: Full  Mouth opening: Limited Mouth Opening  Dental  (+) Edentulous Upper, Edentulous Lower, Lower Dentures, Upper Dentures, Dental Advisory Given   Pulmonary sleep apnea , COPD, Current Smoker, former smoker,    breath sounds clear to auscultation + decreased breath sounds      Cardiovascular Exercise Tolerance: Poor hypertension, Pt. on medications and Pt. on home beta blockers + CAD  + Valvular Problems/Murmurs  Rhythm:Regular Rate:Normal     Neuro/Psych  Headaches, PSYCHIATRIC DISORDERS Anxiety Depression    GI/Hepatic negative GI ROS, Neg liver ROS,   Endo/Other  Morbid obesity  Renal/GU negative Renal ROS     Musculoskeletal negative musculoskeletal ROS (+) Arthritis ,   Abdominal (+) + obese,   Bowel sounds: normal.  Peds  Hematology  (+) anemia ,   Anesthesia Other Findings   Reproductive/Obstetrics negative OB ROS                             BP Readings from Last 3 Encounters:  03/24/18 123/76  03/12/18 (!) 107/59  02/28/18 (!) 166/104   Lab Results  Component Value Date   WBC 6.9 03/24/2018   HGB 13.8 03/24/2018   HCT 42.6 03/24/2018   MCV 94.9 03/24/2018   PLT 170 03/24/2018     Chemistry      Component Value Date/Time   NA 139 03/24/2018 1350   NA 138 01/10/2016 1537   NA 139 07/09/2014 1218   K 4.9 03/24/2018 1350   K 3.4 (L) 07/09/2014 1218   CL 105 03/24/2018 1350   CL 104 07/09/2014 1218   CO2 26 03/24/2018 1350   CO2 27 07/09/2014 1218   BUN 25 (H) 03/24/2018 1350   BUN 16 01/10/2016 1537   BUN 23 (H)  07/09/2014 1218   CREATININE 0.97 03/24/2018 1350   CREATININE 0.52 10/14/2014 1114      Component Value Date/Time   CALCIUM 10.5 (H) 03/24/2018 1350   CALCIUM 10.5 (H) 10/14/2014 1114   ALKPHOS 155 (H) 02/01/2017 1617   ALKPHOS 101 10/14/2014 1114   AST 22 02/01/2017 1617   AST 23 10/14/2014 1114   ALT 13 (L) 02/01/2017 1617   ALT 18 10/14/2014 1114   BILITOT 0.4 02/01/2017 1617   BILITOT 0.5 10/14/2014 1114       CXR 08/13/16: Chronic lung changes without acute pulmonary abnormality noted. Aortic atherosclerosis.  EKG 08/13/16: NSR. Incomplete right bundle branch block   Echo 05/14/16 (Care everywhere):  - Normal LV systolic function - Normal RV systolic function - Mild TR.  - Trivial AR, MR, PR - No valvular stenosis  Cardiac cath 08/25/09:  - mid LAD 10% stenosis - mid CX 20% stenosis  Anesthesia Physical  Anesthesia Plan  ASA: III  Anesthesia Plan: General   Post-op Pain Management:    Induction: Intravenous  PONV Risk Score and Plan: 3 and Ondansetron, Dexamethasone, Propofol, Midazolam and Treatment may vary due to age or medical condition  Airway Management Planned: Oral ETT  Additional Equipment:   Intra-op Plan:   Post-operative Plan: Extubation in OR  Informed Consent: I have reviewed the patients  History and Physical, chart, labs and discussed the procedure including the risks, benefits and alternatives for the proposed anesthesia with the patient or authorized representative who has indicated his/her understanding and acceptance.   Dental advisory given  Plan Discussed with: CRNA, Anesthesiologist and Surgeon  Anesthesia Plan Comments:         Anesthesia Quick Evaluation

## 2018-04-02 ENCOUNTER — Other Ambulatory Visit: Payer: Self-pay

## 2018-04-02 ENCOUNTER — Inpatient Hospital Stay (HOSPITAL_COMMUNITY): Payer: Medicare HMO | Admitting: Certified Registered"

## 2018-04-02 ENCOUNTER — Inpatient Hospital Stay (HOSPITAL_COMMUNITY)
Admission: RE | Admit: 2018-04-02 | Discharge: 2018-04-04 | DRG: 455 | Disposition: A | Discharge status: Home / Self Care | Payer: Medicare HMO | Attending: Neurosurgery | Admitting: Neurosurgery

## 2018-04-02 ENCOUNTER — Encounter (HOSPITAL_COMMUNITY): Payer: Self-pay | Admitting: Urology

## 2018-04-02 ENCOUNTER — Encounter (HOSPITAL_COMMUNITY)
Admission: RE | Disposition: A | Discharge status: Home / Self Care | Payer: Self-pay | Source: Home / Self Care | Attending: Neurosurgery

## 2018-04-02 ENCOUNTER — Inpatient Hospital Stay (HOSPITAL_COMMUNITY): Payer: Medicare HMO

## 2018-04-02 ENCOUNTER — Inpatient Hospital Stay (HOSPITAL_COMMUNITY): Payer: Medicare HMO | Admitting: Physician Assistant

## 2018-04-02 DIAGNOSIS — Z91048 Other nonmedicinal substance allergy status: Secondary | ICD-10-CM

## 2018-04-02 DIAGNOSIS — G2581 Restless legs syndrome: Secondary | ICD-10-CM | POA: Diagnosis present

## 2018-04-02 DIAGNOSIS — M48061 Spinal stenosis, lumbar region without neurogenic claudication: Secondary | ICD-10-CM | POA: Diagnosis present

## 2018-04-02 DIAGNOSIS — E669 Obesity, unspecified: Secondary | ICD-10-CM | POA: Diagnosis present

## 2018-04-02 DIAGNOSIS — Z96643 Presence of artificial hip joint, bilateral: Secondary | ICD-10-CM | POA: Diagnosis present

## 2018-04-02 DIAGNOSIS — Z8614 Personal history of Methicillin resistant Staphylococcus aureus infection: Secondary | ICD-10-CM | POA: Diagnosis not present

## 2018-04-02 DIAGNOSIS — Z79899 Other long term (current) drug therapy: Secondary | ICD-10-CM

## 2018-04-02 DIAGNOSIS — Z86711 Personal history of pulmonary embolism: Secondary | ICD-10-CM | POA: Diagnosis not present

## 2018-04-02 DIAGNOSIS — I10 Essential (primary) hypertension: Secondary | ICD-10-CM | POA: Diagnosis present

## 2018-04-02 DIAGNOSIS — Z7982 Long term (current) use of aspirin: Secondary | ICD-10-CM

## 2018-04-02 DIAGNOSIS — Z7951 Long term (current) use of inhaled steroids: Secondary | ICD-10-CM

## 2018-04-02 DIAGNOSIS — Z885 Allergy status to narcotic agent status: Secondary | ICD-10-CM

## 2018-04-02 DIAGNOSIS — Z818 Family history of other mental and behavioral disorders: Secondary | ICD-10-CM | POA: Diagnosis not present

## 2018-04-02 DIAGNOSIS — J449 Chronic obstructive pulmonary disease, unspecified: Secondary | ICD-10-CM | POA: Diagnosis present

## 2018-04-02 DIAGNOSIS — M4316 Spondylolisthesis, lumbar region: Secondary | ICD-10-CM | POA: Diagnosis present

## 2018-04-02 DIAGNOSIS — M51369 Other intervertebral disc degeneration, lumbar region without mention of lumbar back pain or lower extremity pain: Secondary | ICD-10-CM

## 2018-04-02 DIAGNOSIS — F410 Panic disorder [episodic paroxysmal anxiety] without agoraphobia: Secondary | ICD-10-CM | POA: Diagnosis present

## 2018-04-02 DIAGNOSIS — Z91040 Latex allergy status: Secondary | ICD-10-CM

## 2018-04-02 DIAGNOSIS — G4730 Sleep apnea, unspecified: Secondary | ICD-10-CM | POA: Diagnosis present

## 2018-04-02 DIAGNOSIS — Z419 Encounter for procedure for purposes other than remedying health state, unspecified: Secondary | ICD-10-CM

## 2018-04-02 DIAGNOSIS — Z981 Arthrodesis status: Secondary | ICD-10-CM

## 2018-04-02 DIAGNOSIS — F17210 Nicotine dependence, cigarettes, uncomplicated: Secondary | ICD-10-CM | POA: Diagnosis present

## 2018-04-02 DIAGNOSIS — Z882 Allergy status to sulfonamides status: Secondary | ICD-10-CM

## 2018-04-02 DIAGNOSIS — Z823 Family history of stroke: Secondary | ICD-10-CM

## 2018-04-02 DIAGNOSIS — Z833 Family history of diabetes mellitus: Secondary | ICD-10-CM

## 2018-04-02 DIAGNOSIS — Z90710 Acquired absence of both cervix and uterus: Secondary | ICD-10-CM

## 2018-04-02 DIAGNOSIS — M5136 Other intervertebral disc degeneration, lumbar region: Secondary | ICD-10-CM

## 2018-04-02 DIAGNOSIS — M5116 Intervertebral disc disorders with radiculopathy, lumbar region: Secondary | ICD-10-CM | POA: Diagnosis present

## 2018-04-02 DIAGNOSIS — I2510 Atherosclerotic heart disease of native coronary artery without angina pectoris: Secondary | ICD-10-CM | POA: Diagnosis present

## 2018-04-02 DIAGNOSIS — Z79891 Long term (current) use of opiate analgesic: Secondary | ICD-10-CM

## 2018-04-02 DIAGNOSIS — Z6834 Body mass index (BMI) 34.0-34.9, adult: Secondary | ICD-10-CM | POA: Diagnosis not present

## 2018-04-02 DIAGNOSIS — M21372 Foot drop, left foot: Secondary | ICD-10-CM | POA: Diagnosis present

## 2018-04-02 DIAGNOSIS — Z9049 Acquired absence of other specified parts of digestive tract: Secondary | ICD-10-CM | POA: Diagnosis not present

## 2018-04-02 DIAGNOSIS — Z791 Long term (current) use of non-steroidal anti-inflammatories (NSAID): Secondary | ICD-10-CM

## 2018-04-02 DIAGNOSIS — Z888 Allergy status to other drugs, medicaments and biological substances status: Secondary | ICD-10-CM

## 2018-04-02 SURGERY — POSTERIOR LUMBAR FUSION 1 LEVEL
Anesthesia: General | Site: Spine Lumbar

## 2018-04-02 MED ORDER — EPHEDRINE 5 MG/ML INJ
INTRAVENOUS | Status: AC
  Filled 2018-04-02: qty 10

## 2018-04-02 MED ORDER — ROCURONIUM BROMIDE 50 MG/5ML IV SOSY
PREFILLED_SYRINGE | INTRAVENOUS | Status: AC
  Filled 2018-04-02: qty 5

## 2018-04-02 MED ORDER — PHENOL 1.4 % MT LIQD: 1.0000 | OROMUCOSAL | Status: DC | PRN

## 2018-04-02 MED ORDER — ROPINIROLE HCL 1 MG PO TABS
3.0000 mg | ORAL_TABLET | Freq: Four times a day (QID) | ORAL | Status: DC
  Administered 2018-04-02 – 2018-04-04 (×8): 3 mg via ORAL
  Filled 2018-04-02 (×7): qty 3

## 2018-04-02 MED ORDER — SPIRONOLACTONE 25 MG PO TABS
25.0000 mg | ORAL_TABLET | Freq: Every evening | ORAL | Status: DC
  Administered 2018-04-02 – 2018-04-03 (×2): 25 mg via ORAL
  Filled 2018-04-02 (×2): qty 1

## 2018-04-02 MED ORDER — FENTANYL CITRATE (PF) 250 MCG/5ML IJ SOLN
INTRAMUSCULAR | Status: AC
  Filled 2018-04-02: qty 5

## 2018-04-02 MED ORDER — MENTHOL 3 MG MT LOZG: 1.0000 | LOZENGE | OROMUCOSAL | Status: DC | PRN

## 2018-04-02 MED ORDER — FENTANYL CITRATE (PF) 100 MCG/2ML IJ SOLN
INTRAMUSCULAR | Status: DC | PRN
  Administered 2018-04-02 (×2): 50 ug via INTRAVENOUS
  Administered 2018-04-02: 100 ug via INTRAVENOUS
  Administered 2018-04-02 (×4): 50 ug via INTRAVENOUS

## 2018-04-02 MED ORDER — SODIUM CHLORIDE 0.9 % IV SOLN
0.1000 mg/kg/h | INTRAVENOUS | Status: AC
  Administered 2018-04-02: .3 mg/kg/h via INTRAVENOUS
  Filled 2018-04-02: qty 2

## 2018-04-02 MED ORDER — THROMBIN 5000 UNITS EX SOLR
CUTANEOUS | Status: AC
  Filled 2018-04-02: qty 5000

## 2018-04-02 MED ORDER — ROCURONIUM BROMIDE 50 MG/5ML IV SOSY
PREFILLED_SYRINGE | INTRAVENOUS | Status: DC | PRN
  Administered 2018-04-02 (×2): 50 mg via INTRAVENOUS

## 2018-04-02 MED ORDER — SODIUM CHLORIDE 0.9% FLUSH
3.0000 mL | Freq: Two times a day (BID) | INTRAVENOUS | Status: DC
  Administered 2018-04-02: 3 mL via INTRAVENOUS

## 2018-04-02 MED ORDER — BACITRACIN ZINC 500 UNIT/GM EX OINT
TOPICAL_OINTMENT | CUTANEOUS | Status: AC
  Filled 2018-04-02: qty 28.35

## 2018-04-02 MED ORDER — GABAPENTIN 100 MG PO CAPS
200.0000 mg | ORAL_CAPSULE | Freq: Every day | ORAL | Status: DC
  Administered 2018-04-02 – 2018-04-03 (×2): 200 mg via ORAL
  Filled 2018-04-02 (×2): qty 2

## 2018-04-02 MED ORDER — ONDANSETRON HCL 4 MG PO TABS: 4.0000 mg | ORAL_TABLET | Freq: Four times a day (QID) | ORAL | Status: DC | PRN

## 2018-04-02 MED ORDER — CYCLOBENZAPRINE HCL 10 MG PO TABS
ORAL_TABLET | ORAL | Status: AC
  Filled 2018-04-02: qty 1

## 2018-04-02 MED ORDER — MIDAZOLAM HCL 5 MG/5ML IJ SOLN
INTRAMUSCULAR | Status: DC | PRN
  Administered 2018-04-02: 2 mg via INTRAVENOUS

## 2018-04-02 MED ORDER — DEXAMETHASONE SODIUM PHOSPHATE 10 MG/ML IJ SOLN
INTRAMUSCULAR | Status: DC | PRN
  Administered 2018-04-02: 10 mg via INTRAVENOUS

## 2018-04-02 MED ORDER — CEFAZOLIN SODIUM-DEXTROSE 2-4 GM/100ML-% IV SOLN
2.0000 g | INTRAVENOUS | Status: AC
  Administered 2018-04-02: 2 g via INTRAVENOUS

## 2018-04-02 MED ORDER — SODIUM CHLORIDE 0.9 % IV SOLN
250.0000 mL | INTRAVENOUS | Status: DC
  Administered 2018-04-02: 250 mL via INTRAVENOUS

## 2018-04-02 MED ORDER — ACETAMINOPHEN 325 MG PO TABS
650.0000 mg | ORAL_TABLET | ORAL | Status: DC | PRN
  Administered 2018-04-03: 650 mg via ORAL

## 2018-04-02 MED ORDER — BUPIVACAINE-EPINEPHRINE (PF) 0.5% -1:200000 IJ SOLN
INTRAMUSCULAR | Status: DC | PRN
  Administered 2018-04-02: 10 mL

## 2018-04-02 MED ORDER — FUROSEMIDE 20 MG PO TABS
20.0000 mg | ORAL_TABLET | Freq: Two times a day (BID) | ORAL | Status: DC
  Administered 2018-04-02 – 2018-04-03 (×3): 20 mg via ORAL
  Filled 2018-04-02 (×3): qty 1

## 2018-04-02 MED ORDER — CHLORHEXIDINE GLUCONATE CLOTH 2 % EX PADS: 6.0000 | MEDICATED_PAD | Freq: Once | CUTANEOUS | Status: DC

## 2018-04-02 MED ORDER — KETAMINE INJECTION FOR OPTIME (MG/KG/HR) DOCUMENTATION
INTRAVENOUS | Status: DC | PRN
  Administered 2018-04-02: 40 mg via INTRAVENOUS

## 2018-04-02 MED ORDER — ONDANSETRON HCL 4 MG/2ML IJ SOLN: 4.0000 mg | Freq: Four times a day (QID) | INTRAMUSCULAR | Status: DC | PRN

## 2018-04-02 MED ORDER — 0.9 % SODIUM CHLORIDE (POUR BTL) OPTIME
TOPICAL | Status: DC | PRN
  Administered 2018-04-02: 1000 mL

## 2018-04-02 MED ORDER — LACTATED RINGERS IV SOLN
INTRAVENOUS | Status: DC | PRN
  Administered 2018-04-02 (×2): via INTRAVENOUS

## 2018-04-02 MED ORDER — ALBUTEROL SULFATE (2.5 MG/3ML) 0.083% IN NEBU: 2.5000 mg | INHALATION_SOLUTION | RESPIRATORY_TRACT | Status: DC | PRN

## 2018-04-02 MED ORDER — SODIUM CHLORIDE 0.9 % IV SOLN: 250.0000 mL | INTRAVENOUS | Status: DC

## 2018-04-02 MED ORDER — IPRATROPIUM-ALBUTEROL 0.5-2.5 (3) MG/3ML IN SOLN: 3.0000 mL | Freq: Four times a day (QID) | RESPIRATORY_TRACT | Status: DC | PRN

## 2018-04-02 MED ORDER — BUPIVACAINE LIPOSOME 1.3 % IJ SUSP
20.0000 mL | INTRAMUSCULAR | Status: DC
  Filled 2018-04-02: qty 20

## 2018-04-02 MED ORDER — ZOLPIDEM TARTRATE 5 MG PO TABS: 5.0000 mg | ORAL_TABLET | Freq: Every evening | ORAL | Status: DC | PRN

## 2018-04-02 MED ORDER — PROPOFOL 10 MG/ML IV BOLUS
INTRAVENOUS | Status: DC | PRN
  Administered 2018-04-02: 110 mg via INTRAVENOUS

## 2018-04-02 MED ORDER — SODIUM CHLORIDE 0.9% FLUSH: 3.0000 mL | INTRAVENOUS | Status: DC | PRN

## 2018-04-02 MED ORDER — OXYCODONE HCL 5 MG PO TABS
ORAL_TABLET | ORAL | Status: AC
  Filled 2018-04-02: qty 1

## 2018-04-02 MED ORDER — BUPIVACAINE LIPOSOME 1.3 % IJ SUSP
INTRAMUSCULAR | Status: DC | PRN
  Administered 2018-04-02: 20 mL

## 2018-04-02 MED ORDER — DEXAMETHASONE SODIUM PHOSPHATE 10 MG/ML IJ SOLN
INTRAMUSCULAR | Status: AC
  Filled 2018-04-02: qty 1

## 2018-04-02 MED ORDER — VANCOMYCIN HCL 1 G IV SOLR
INTRAVENOUS | Status: DC | PRN
  Administered 2018-04-02: 1000 mg via TOPICAL

## 2018-04-02 MED ORDER — BISACODYL 10 MG RE SUPP: 10.0000 mg | Freq: Every day | RECTAL | Status: DC | PRN

## 2018-04-02 MED ORDER — LIDOCAINE 2% (20 MG/ML) 5 ML SYRINGE
INTRAMUSCULAR | Status: AC
  Filled 2018-04-02: qty 5

## 2018-04-02 MED ORDER — MORPHINE SULFATE (PF) 4 MG/ML IV SOLN
4.0000 mg | INTRAVENOUS | Status: DC | PRN
  Administered 2018-04-02: 4 mg via INTRAVENOUS
  Filled 2018-04-02: qty 1

## 2018-04-02 MED ORDER — PROPOFOL 10 MG/ML IV BOLUS
INTRAVENOUS | Status: AC
  Filled 2018-04-02: qty 20

## 2018-04-02 MED ORDER — CYCLOBENZAPRINE HCL 10 MG PO TABS
10.0000 mg | ORAL_TABLET | Freq: Three times a day (TID) | ORAL | Status: DC | PRN
  Administered 2018-04-02 – 2018-04-03 (×4): 10 mg via ORAL
  Filled 2018-04-02 (×3): qty 1

## 2018-04-02 MED ORDER — SODIUM CHLORIDE 0.9 % IV SOLN
INTRAVENOUS | Status: DC | PRN
  Administered 2018-04-02: 30 ug/min via INTRAVENOUS

## 2018-04-02 MED ORDER — MIDAZOLAM HCL 2 MG/2ML IJ SOLN
INTRAMUSCULAR | Status: AC
  Filled 2018-04-02: qty 2

## 2018-04-02 MED ORDER — DOCUSATE SODIUM 100 MG PO CAPS
100.0000 mg | ORAL_CAPSULE | Freq: Two times a day (BID) | ORAL | Status: DC
  Administered 2018-04-02 – 2018-04-04 (×4): 100 mg via ORAL
  Filled 2018-04-02 (×4): qty 1

## 2018-04-02 MED ORDER — MOMETASONE FURO-FORMOTEROL FUM 200-5 MCG/ACT IN AERO
2.0000 | INHALATION_SPRAY | Freq: Two times a day (BID) | RESPIRATORY_TRACT | Status: DC
  Administered 2018-04-03 (×2): 2 via RESPIRATORY_TRACT
  Filled 2018-04-02: qty 8.8

## 2018-04-02 MED ORDER — FENTANYL CITRATE (PF) 100 MCG/2ML IJ SOLN
INTRAMUSCULAR | Status: AC
  Filled 2018-04-02: qty 2

## 2018-04-02 MED ORDER — CEFAZOLIN SODIUM-DEXTROSE 2-4 GM/100ML-% IV SOLN
INTRAVENOUS | Status: AC
  Filled 2018-04-02: qty 100

## 2018-04-02 MED ORDER — MEPERIDINE HCL 50 MG/ML IJ SOLN: 6.2500 mg | INTRAMUSCULAR | Status: DC | PRN

## 2018-04-02 MED ORDER — PAROXETINE HCL 20 MG PO TABS
40.0000 mg | ORAL_TABLET | Freq: Every day | ORAL | Status: DC
  Administered 2018-04-02 – 2018-04-03 (×2): 40 mg via ORAL
  Filled 2018-04-02 (×3): qty 2

## 2018-04-02 MED ORDER — ACETAMINOPHEN 500 MG PO TABS
1000.0000 mg | ORAL_TABLET | Freq: Four times a day (QID) | ORAL | Status: AC
  Administered 2018-04-02 – 2018-04-03 (×4): 1000 mg via ORAL
  Filled 2018-04-02 (×4): qty 2

## 2018-04-02 MED ORDER — EPHEDRINE SULFATE 50 MG/ML IJ SOLN
INTRAMUSCULAR | Status: DC | PRN
  Administered 2018-04-02 (×2): 10 mg via INTRAVENOUS

## 2018-04-02 MED ORDER — BACITRACIN ZINC 500 UNIT/GM EX OINT
TOPICAL_OINTMENT | CUTANEOUS | Status: DC | PRN
  Administered 2018-04-02: 1 via TOPICAL

## 2018-04-02 MED ORDER — LIDOCAINE 2% (20 MG/ML) 5 ML SYRINGE
INTRAMUSCULAR | Status: DC | PRN
  Administered 2018-04-02: 100 mg via INTRAVENOUS

## 2018-04-02 MED ORDER — CEFAZOLIN SODIUM-DEXTROSE 2-4 GM/100ML-% IV SOLN
2.0000 g | Freq: Three times a day (TID) | INTRAVENOUS | Status: AC
  Administered 2018-04-02 – 2018-04-03 (×2): 2 g via INTRAVENOUS
  Filled 2018-04-02 (×2): qty 100

## 2018-04-02 MED ORDER — OXYCODONE HCL 5 MG PO TABS
10.0000 mg | ORAL_TABLET | ORAL | Status: DC | PRN
  Administered 2018-04-02 – 2018-04-04 (×10): 10 mg via ORAL
  Filled 2018-04-02 (×10): qty 2

## 2018-04-02 MED ORDER — FLUTICASONE PROPIONATE 50 MCG/ACT NA SUSP
1.0000 | Freq: Every day | NASAL | Status: DC | PRN
  Filled 2018-04-02: qty 16

## 2018-04-02 MED ORDER — ACETAMINOPHEN 650 MG RE SUPP: 650.0000 mg | RECTAL | Status: DC | PRN

## 2018-04-02 MED ORDER — FENTANYL CITRATE (PF) 100 MCG/2ML IJ SOLN
25.0000 ug | INTRAMUSCULAR | Status: DC | PRN
  Administered 2018-04-02 (×2): 50 ug via INTRAVENOUS

## 2018-04-02 MED ORDER — BUPIVACAINE-EPINEPHRINE 0.5% -1:200000 IJ SOLN
INTRAMUSCULAR | Status: AC
  Filled 2018-04-02: qty 1

## 2018-04-02 MED ORDER — METOPROLOL SUCCINATE ER 50 MG PO TB24
50.0000 mg | ORAL_TABLET | Freq: Every day | ORAL | Status: DC
  Administered 2018-04-02 – 2018-04-03 (×2): 50 mg via ORAL
  Filled 2018-04-02 (×2): qty 1

## 2018-04-02 MED ORDER — OXYCODONE HCL 5 MG PO TABS
5.0000 mg | ORAL_TABLET | ORAL | Status: DC | PRN
  Administered 2018-04-03: 5 mg via ORAL
  Filled 2018-04-02: qty 1

## 2018-04-02 MED ORDER — THROMBIN 5000 UNITS EX SOLR
OROMUCOSAL | Status: DC | PRN
  Administered 2018-04-02: 5 mL via TOPICAL

## 2018-04-02 MED ORDER — VANCOMYCIN HCL 1000 MG IV SOLR
INTRAVENOUS | Status: AC
  Filled 2018-04-02: qty 1000

## 2018-04-02 MED ORDER — SODIUM CHLORIDE 0.9 % IV SOLN
INTRAVENOUS | Status: DC | PRN
  Administered 2018-04-02: 500 mL

## 2018-04-02 MED ORDER — ONDANSETRON HCL 4 MG/2ML IJ SOLN
INTRAMUSCULAR | Status: DC | PRN
  Administered 2018-04-02: 4 mg via INTRAVENOUS

## 2018-04-02 SURGICAL SUPPLY — 65 items
BAG DECANTER FOR FLEXI CONT (MISCELLANEOUS) ×2 IMPLANT
BENZOIN TINCTURE PRP APPL 2/3 (GAUZE/BANDAGES/DRESSINGS) ×2 IMPLANT
BLADE CLIPPER SURG (BLADE) IMPLANT
BUR MATCHSTICK NEURO 3.0 LAGG (BURR) ×2 IMPLANT
BUR PRECISION FLUTE 6.0 (BURR) ×2 IMPLANT
CANISTER SUCT 3000ML PPV (MISCELLANEOUS) ×2 IMPLANT
CARTRIDGE OIL MAESTRO DRILL (MISCELLANEOUS) ×1 IMPLANT
CONT SPEC 4OZ CLIKSEAL STRL BL (MISCELLANEOUS) ×2 IMPLANT
COVER BACK TABLE 60X90IN (DRAPES) ×2 IMPLANT
DECANTER SPIKE VIAL GLASS SM (MISCELLANEOUS) ×2 IMPLANT
DIFFUSER DRILL AIR PNEUMATIC (MISCELLANEOUS) ×2 IMPLANT
DRAPE C-ARM 42X72 X-RAY (DRAPES) ×4 IMPLANT
DRAPE HALF SHEET 40X57 (DRAPES) ×2 IMPLANT
DRAPE LAPAROTOMY 100X72X124 (DRAPES) ×2 IMPLANT
DRAPE SURG 17X23 STRL (DRAPES) ×8 IMPLANT
DRSG OPSITE POSTOP 4X6 (GAUZE/BANDAGES/DRESSINGS) ×2 IMPLANT
ELECT BLADE 4.0 EZ CLEAN MEGAD (MISCELLANEOUS) ×2
ELECT REM PT RETURN 9FT ADLT (ELECTROSURGICAL) ×2
ELECTRODE BLDE 4.0 EZ CLN MEGD (MISCELLANEOUS) ×1 IMPLANT
ELECTRODE REM PT RTRN 9FT ADLT (ELECTROSURGICAL) ×1 IMPLANT
EVACUATOR 1/8 PVC DRAIN (DRAIN) IMPLANT
GAUZE 4X4 16PLY RFD (DISPOSABLE) IMPLANT
GAUZE SPONGE 4X4 12PLY STRL (GAUZE/BANDAGES/DRESSINGS) IMPLANT
GLOVE BIO SURGEON STRL SZ 6.5 (GLOVE) ×2 IMPLANT
GLOVE BIO SURGEON STRL SZ8 (GLOVE) ×4 IMPLANT
GLOVE BIO SURGEON STRL SZ8.5 (GLOVE) ×4 IMPLANT
GLOVE BIOGEL PI IND STRL 6.5 (GLOVE) ×1 IMPLANT
GLOVE BIOGEL PI INDICATOR 6.5 (GLOVE) ×1
GLOVE EXAM NITRILE LRG STRL (GLOVE) IMPLANT
GLOVE EXAM NITRILE XL STR (GLOVE) IMPLANT
GLOVE EXAM NITRILE XS STR PU (GLOVE) IMPLANT
GLOVE SURG SS PI 6.5 STRL IVOR (GLOVE) ×12 IMPLANT
GOWN STRL REUS W/ TWL LRG LVL3 (GOWN DISPOSABLE) ×3 IMPLANT
GOWN STRL REUS W/ TWL XL LVL3 (GOWN DISPOSABLE) ×2 IMPLANT
GOWN STRL REUS W/TWL 2XL LVL3 (GOWN DISPOSABLE) IMPLANT
GOWN STRL REUS W/TWL LRG LVL3 (GOWN DISPOSABLE) ×3
GOWN STRL REUS W/TWL XL LVL3 (GOWN DISPOSABLE) ×2
HEMOSTAT POWDER KIT SURGIFOAM (HEMOSTASIS) ×2 IMPLANT
KIT BASIN OR (CUSTOM PROCEDURE TRAY) ×2 IMPLANT
KIT TURNOVER KIT B (KITS) ×2 IMPLANT
MILL MEDIUM DISP (BLADE) IMPLANT
NEEDLE HYPO 21X1.5 SAFETY (NEEDLE) ×2 IMPLANT
NEEDLE HYPO 22GX1.5 SAFETY (NEEDLE) ×2 IMPLANT
NS IRRIG 1000ML POUR BTL (IV SOLUTION) ×2 IMPLANT
OIL CARTRIDGE MAESTRO DRILL (MISCELLANEOUS) ×2
PACK LAMINECTOMY NEURO (CUSTOM PROCEDURE TRAY) ×2 IMPLANT
PAD ARMBOARD 7.5X6 YLW CONV (MISCELLANEOUS) ×10 IMPLANT
PATTIES SURGICAL .5 X1 (DISPOSABLE) IMPLANT
ROD VITALITY CVD TI 5.5X70 (Rod) ×4 IMPLANT
SCREW VITALITY PA 7.5X45MM (Screw) ×4 IMPLANT
SPACER ALTERA 10X31 8-12MM-8 (Spacer) ×2 IMPLANT
SPONGE LAP 4X18 RFD (DISPOSABLE) IMPLANT
SPONGE NEURO XRAY DETECT 1X3 (DISPOSABLE) IMPLANT
SPONGE SURGIFOAM ABS GEL 100 (HEMOSTASIS) IMPLANT
STRIP BIOACTIVE 10CC 25X50X8 (Miscellaneous) ×2 IMPLANT
STRIP CLOSURE SKIN 1/2X4 (GAUZE/BANDAGES/DRESSINGS) ×2 IMPLANT
SUT VIC AB 1 CT1 18XBRD ANBCTR (SUTURE) ×2 IMPLANT
SUT VIC AB 1 CT1 8-18 (SUTURE) ×2
SUT VIC AB 2-0 CP2 18 (SUTURE) ×4 IMPLANT
SYR 20CC LL (SYRINGE) ×2 IMPLANT
TOP CLOSURE TORQ LIMIT (Neuro Prosthesis/Implant) ×12 IMPLANT
TOWEL GREEN STERILE (TOWEL DISPOSABLE) ×2 IMPLANT
TOWEL GREEN STERILE FF (TOWEL DISPOSABLE) ×2 IMPLANT
TRAY FOLEY MTR SLVR 16FR STAT (SET/KITS/TRAYS/PACK) ×2 IMPLANT
WATER STERILE IRR 1000ML POUR (IV SOLUTION) ×2 IMPLANT

## 2018-04-02 NOTE — Op Note (Signed)
Brief history: The patient is a 72 year old white female whose had multiple previous lumbar surgeries and fusions.  He has developed recurrent back and leg pain consistent with neurogenic claudication.  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  I discussed the various treatment options with the patient.  She has decided to proceed with surgery after weighing the risks, benefits and alternatives.  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 Etc. requiring a wide decompression of the nerve roots.);  L3-4 transforaminal lumbar interbody fusion with local morselized autograft bone and Kinnex graft extender; insertion of interbody prosthesis at L3-4 (globus peek expandable interbody prosthesis); posterior segmental instrumentation from L3 to L5 with globus titanium pedicle screws and rods; posterior lateral arthrodesis at L3-4 with local morselized autograft bone and Kinnex bone graft extender; exploration of lumbar fusion  Surgeon: Dr. Earle Gell  Asst.: Olivia Mackie, nurse practitioner  Anesthesia: Gen. endotracheal  Estimated blood loss: 200 cc  Drains: None  Complications: None  Description of procedure: The patient was brought to the operating room by the anesthesia team. General endotracheal anesthesia was induced. The patient was turned to the prone position on the Wilson frame. The patient's lumbosacral region was then prepared with Betadine scrub  and Betadine solution. Sterile drapes were applied.  I then injected the area to be incised with Marcaine with epinephrine solution. I then used the scalpel to make a linear midline incision over the L3-4 and L4-5 interspace. I then used electrocautery to perform a bilateral subperiosteal dissection exposing the spinous process and lamina of L3 and L4 and expose the old hardware at L4-5.  We then inserted the Verstrac retractor to provide exposure.  We explored the fusion by removing the caps from the old screws at L4 and L5 bilaterally.  We removed the rods.  We inspected the arthrodesis at L4-5.  It appeared solid.  I began the decompression by using the high speed drill to perform laminotomies at L3-4 bilaterally. We then used the Kerrison punches to widen the laminotomy and removed the ligamentum flavum at L3-4 bilaterally. We used the Kerrison punches to remove the medial facets at 3 4 bilaterally, we remove the inferior facet at L3-4 on the right. We performed wide foraminotomies about the bilateral L3 and L4 nerve roots completing the decompression.  We now turned our attention to the posterior lumbar interbody fusion. I used a scalpel to incise the intervertebral disc at L3-4 bilaterally. I then performed a partial intervertebral discectomy at L3-4 bilaterally using the pituitary forceps.  We encountered a herniated disc in the left which we removed with the pituitary forceps.  We prepared the vertebral endplates at T4-1 bilaterally for the fusion by removing the soft tissues with the curettes. We then used the trial spacers to pick the appropriate sized interbody prosthesis. We prefilled his prosthesis with a combination of local morselized autograft bone that we obtained during the decompression as well as Kinnex bone graft extender. We inserted the prefilled prosthesis into the interspace at L3-4. There was a good snug fit of the prosthesis  in the interspace. We then filled and the remainder of  the intervertebral disc space with local morselized autograft bone and Kinnex. This completed the posterior lumbar interbody arthrodesis.  We now turned attention to the instrumentation. Under fluoroscopic guidance we cannulated the bilateral L3 pedicles with the bone probe. We then removed the bone probe. We then tapped the pedicle with a 6.5 millimeter tap. We then removed the tap. We probed inside the tapped pedicle with a ball probe to rule out cortical breaches. We then inserted a 7.5 x 45 millimeter pedicle screw into the L3 pedicles bilaterally under fluoroscopic guidance. We then palpated along the medial aspect of the pedicles to rule out cortical breaches. There were none. The nerve roots were not injured. We then connected the unilateral pedicle screws from L3-L5 with a lordotic rod. We compressed the construct and secured the rod in place with the caps. We then tightened the caps appropriately. This completed the instrumentation from L3-L5 bilaterally.  We now turned our attention to the posterior lateral arthrodesis at L3-4 bilaterally. We used the high-speed drill to decorticate the remainder of the facets, pars, transverse process at L3-4 bilaterally. We then applied a combination of local morselized autograft bone and Kinnex bone graft extender over these decorticated posterior lateral structures. This completed the posterior lateral arthrodesis.  We then obtained hemostasis using bipolar electrocautery. We irrigated the wound out with bacitracin solution. We inspected the thecal sac and nerve roots and noted they were well decompressed. We then removed the retractor. We placed vancomycin powder in the wound. We reapproximated patient's thoracolumbar fascia with interrupted #1 Vicryl suture. We reapproximated patient's subcutaneous tissue with interrupted 2-0 Vicryl suture. The reapproximated patient's skin with Steri-Strips and benzoin. The wound was then coated with bacitracin ointment. A  sterile dressing was applied. The drapes were removed. The patient was subsequently returned to the supine position where they were extubated by the anesthesia team. He was then transported to the post anesthesia care unit in stable condition. All sponge instrument and needle counts were reportedly correct at the end of this case.

## 2018-04-02 NOTE — Anesthesia Procedure Notes (Signed)
Procedure Name: Intubation Date/Time: 04/02/2018 6:47 AM Performed by: Lance Coon, CRNA Pre-anesthesia Checklist: Emergency Drugs available, Patient identified, Suction available, Patient being monitored and Timeout performed Patient Re-evaluated:Patient Re-evaluated prior to induction Oxygen Delivery Method: Circle system utilized Preoxygenation: Pre-oxygenation with 100% oxygen Induction Type: IV induction Ventilation: Mask ventilation without difficulty Laryngoscope Size: Miller Grade View: Grade I Tube type: Oral Tube size: 7.0 mm Number of attempts: 1 Airway Equipment and Method: Stylet Placement Confirmation: ETT inserted through vocal cords under direct vision,  positive ETCO2 and breath sounds checked- equal and bilateral Secured at: 20 cm Tube secured with: Tape Dental Injury: Teeth and Oropharynx as per pre-operative assessment

## 2018-04-02 NOTE — Plan of Care (Signed)
  Problem: Education: Goal: Ability to verbalize activity precautions or restrictions will improve Outcome: Progressing Goal: Knowledge of the prescribed therapeutic regimen will improve Outcome: Progressing Goal: Understanding of discharge needs will improve Outcome: Progressing   

## 2018-04-02 NOTE — Anesthesia Postprocedure Evaluation (Signed)
Anesthesia Post Note  Patient: Ann Silva  Procedure(s) Performed: POSTERIOR LUMBAR INTERBODY FUSION, INTERBODY PROSTHESIS, POSTERIOR INSTRUMENTATION/FUSION LUMBAR THREE- LUMBAR FOUR; EXPLORE FUSION (N/A Spine Lumbar)     Patient location during evaluation: PACU Anesthesia Type: General Level of consciousness: awake and alert Pain management: pain level controlled Vital Signs Assessment: post-procedure vital signs reviewed and stable Respiratory status: spontaneous breathing, nonlabored ventilation, respiratory function stable and patient connected to nasal cannula oxygen Cardiovascular status: blood pressure returned to baseline and stable Postop Assessment: no apparent nausea or vomiting Anesthetic complications: no    Last Vitals:  Vitals:   04/02/18 1230 04/02/18 1251  BP: 110/68 102/73  Pulse: 73 73  Resp: 16 18  Temp:  36.6 C  SpO2: 94% 95%    Last Pain:  Vitals:   04/02/18 1251  TempSrc: Oral  PainSc:                  Anthem Frazer

## 2018-04-02 NOTE — Progress Notes (Signed)
Subjective: The patient is alert and pleasant.  She looks well.  Her husband is at the bedside.  She has no complaints.  Objective: Vital signs in last 24 hours: Temp:  [97.9 F (36.6 C)-98.6 F (37 C)] 97.9 F (36.6 C) (09/26 1251) Pulse Rate:  [68-77] 73 (09/26 1251) Resp:  [8-18] 18 (09/26 1251) BP: (92-146)/(53-89) 102/73 (09/26 1251) SpO2:  [92 %-98 %] 95 % (09/26 1251) Weight:  [80.2 kg] 80.2 kg (09/26 1308) Estimated body mass index is 34.51 kg/m as calculated from the following:   Height as of this encounter: 5' (1.524 m).   Weight as of this encounter: 80.2 kg.   Intake/Output from previous day: No intake/output data recorded. Intake/Output this shift: Total I/O In: 1200 [I.V.:1200] Out: 800 [Urine:600; Blood:200]  Physical exam the patient is alert and pleasant.  She is moving her lower extremities well.  Lab Results: No results for input(s): WBC, HGB, HCT, PLT in the last 72 hours. BMET No results for input(s): NA, K, CL, CO2, GLUCOSE, BUN, CREATININE, CALCIUM in the last 72 hours.  Studies/Results: Dg Lumbar Spine 2-3 Views  Result Date: 04/02/2018 CLINICAL DATA:  Status post surgical posterior fusion of L3-4. EXAM: LUMBAR SPINE - 2-3 VIEW; DG C-ARM 61-120 MIN Radiation exposure index: 7.53 mGy. COMPARISON:  Fluoroscopic images of December 25, 2016. FINDINGS: Two intraoperative fluoroscopic images of the lower lumbar spine demonstrate the patient be status post interval posterior fusion of L3-4 with bilateral intrapedicular screw placement and interbody fusion. IMPRESSION: Interval posterior fusion of L3-4. Electronically Signed   By: Marijo Conception, M.D.   On: 04/02/2018 12:00   Dg C-arm 1-60 Min  Result Date: 04/02/2018 CLINICAL DATA:  Status post surgical posterior fusion of L3-4. EXAM: LUMBAR SPINE - 2-3 VIEW; DG C-ARM 61-120 MIN Radiation exposure index: 7.53 mGy. COMPARISON:  Fluoroscopic images of December 25, 2016. FINDINGS: Two intraoperative fluoroscopic images  of the lower lumbar spine demonstrate the patient be status post interval posterior fusion of L3-4 with bilateral intrapedicular screw placement and interbody fusion. IMPRESSION: Interval posterior fusion of L3-4. Electronically Signed   By: Marijo Conception, M.D.   On: 04/02/2018 12:00    Assessment/Plan: The patient is doing well.  LOS: 0 days     Ann Silva 04/02/2018, 3:30 PM

## 2018-04-02 NOTE — H&P (Signed)
Subjective:  Ann Silva is a 72 year old white female on whom I previously performed a lumbar fusion.  He did well initially but has developed recurrent back and leg pain.  She has failed medical management.  She was worked up with a lumbar MRI which demonstrated an L3-4 spondylolisthesis with spinal stenosis.  I discussed Ann various treatment options with her.  She has decided to proceed with surgery.    Past Medical History:  Diagnosis Date  . Anemia    takes ferrous sulfate daily  . Anxiety   . Arthritis   . Bladder spasm   . Chronic back pain    spinal stenosis  . Colon cancer ([place])   . Complication of anesthesia    B/P bottoms out, hard to wake up   . Contact dermatitis   . COPD (chronic obstructive pulmonary disease) ([facility])    Albuterol inhaler and DuoNeb as needed  . Coronary artery disease    mild by 08/25/09 cath  . Foot drop, left   . Headache   . Heart murmur    never had any problems  . History of blood transfusion    no abnormal reaction noted  . History of bronchitis   . History of MRSA infection 6+ yrs ago  . Hypertension    takes Metoprolol daily  . Insomnia    takes Trazodone nightly  . Joint pain   . Joint swelling   . Nocturia   . Panic attacks    takes Effexor daily  . Peripheral edema    takes Lasix daily and takes Bumex every 3 days.Takes Aldactone daily   . Pulmonary emboli ([place]) 2018  . Restless leg syndrome    takes Requip daily  . RLS (restless legs syndrome)   . Sleep apnea    doesnt use CPAP    Past Surgical History:  Procedure Laterality Date  . ABDOMINAL HYSTERECTOMY    . BACK SURGERY    . CHOLECYSTECTOMY    . COLON SURGERY     partial colectomy d/t cancer cells  . COLONOSCOPY    . I&D of left hip      x 4  . JOINT REPLACEMENT    . Resection total hip    . Revision of total hip arthroplasty     x3-4  . right hip replacement    . TOTAL HIP ARTHROPLASTY Left     Allergies  Allergen Reactions  . Hydrocodone-Chlorpheniramine  Shortness Of Breath  . Sulfa Antibiotics Shortness Of Breath  . Chlorhexidine Rash  . Methadone Other (See Comments)    Altered Mental Status  . Benadryl [Diphenhydramine] Anxiety  . Latex Rash  . Tape Rash    "Ann white kind they use after surgery"    Social History   Tobacco Use  . Smoking status: Current Every Day Smoker    Packs/day: 0.75    Years: 51.00    Pack years: 38.25    Types: Cigarettes    Start date: 07/08/1964  . Smokeless tobacco: Never Used  Substance Use Topics  . Alcohol use: No    Family History  Problem Relation Age of Onset  . Cancer Mother   . Stroke Father   . Diabetes Sister   . Mental illness Sister    Prior to Admission medications   Medication Sig Start Date End Date Taking? Authorizing Provider  albuterol (PROVENTIL HFA;VENTOLIN HFA) 108 (90 Base) MCG/ACT inhaler Inhale 2 puffs into Ann lungs every 4 (four) hours as needed for  wheezing or shortness of breath. 12/05/15  Yes Wieting, Delfino Lovett, MD  aspirin EC 81 MG tablet Take 81 mg by mouth daily.   Yes [provider]  budesonide-formoterol (SYMBICORT) 160-4.5 MCG/ACT inhaler Inhale 2 puffs into Ann lungs 2 (two) times daily. Silva taking differently: Inhale 2 puffs into Ann lungs 2 (two) times daily as needed (for respiratory issues).  12/05/15  Yes Wieting, Richard, MD  furosemide (LASIX) 20 MG tablet Take 1 tablet (20 mg total) by mouth daily. Silva taking differently: Take 20 mg by mouth 2 (two) times daily.  04/29/16 02/28/26 Yes Lavonia Drafts, MD  gabapentin (NEURONTIN) 100 MG capsule Take 200 mg by mouth at bedtime. 02/15/18  Yes [provider]  metoprolol succinate (TOPROL-XL) 50 MG 24 hr tablet Take 1 tablet (50 mg total) by mouth every evening. Take with or immediately following a meal. Silva taking differently: Take 50 mg by mouth at bedtime. Take with or immediately following a meal. 12/26/15  Yes Crissman, Jeannette How, MD  naproxen (NAPROXEN DR) 500 MG EC tablet Take 500  mg by mouth 2 (two) times daily with a meal.   Yes [provider]  oxyCODONE-acetaminophen (PERCOCET) 5-325 MG tablet Take 1-2 tablets by mouth every 6 (six) hours as needed for severe pain. Silva taking differently: Take 1-2 tablets by mouth every 8 (eight) hours as needed for severe pain.  12/23/17 12/23/18 Yes Eula Listen, MD  PARoxetine (PAXIL) 40 MG tablet Take 40 mg by mouth at bedtime.    Yes [provider]  rOPINIRole (REQUIP) 3 MG tablet Take 1 tablet (3 mg total) by mouth 4 (four) times daily. 04/20/15  Yes Crissman, Jeannette How, MD  spironolactone (ALDACTONE) 25 MG tablet Take 25 mg by mouth every evening.   Yes [provider]  acetaminophen (TYLENOL) 500 MG tablet Take 1,000 mg by mouth daily as needed for headache.    [provider]  fluticasone (FLONASE) 50 MCG/ACT nasal spray Place 1 spray into both nostrils daily as needed (for sinus issues).     [provider]  ipratropium-albuterol (DUONEB) 0.5-2.5 (3) MG/3ML SOLN Take 3 mLs by nebulization every 6 (six) hours as needed (for wheezing/shortness of breath).    [provider]  methocarbamol (ROBAXIN) 500 MG tablet Take 1-2 tablets (500-1,000 mg total) by mouth 4 (four) times daily as needed for muscle spasms. Silva taking differently: Take 500 mg by mouth 4 (four) times daily as needed for muscle spasms.  09/19/95   Delora Fuel, MD  naproxen (NAPROSYN) 375 MG tablet Take 1 tablet (375 mg total) by mouth 2 (two) times daily. Silva not taking: Reported on 0/26/3785 8/85/02   Delora Fuel, MD  oxyCODONE (ROXICODONE) 5 MG immediate release tablet Take 1-2 tablets (5-10 mg total) by mouth every 4 (four) hours as needed. Silva not taking: Reported on 7/74/1287 8/67/67   Delora Fuel, MD     Review of Systems  Positive ROS: As above  All other systems have been reviewed and were otherwise negative with Ann exception of those mentioned in Ann HPI and as  above.  Objective: Vital signs in last 24 hours: Temp:  [98.6 F (37 C)] 98.6 F (37 C) (09/26 0609) Pulse Rate:  [68] 68 (09/26 0609) Resp:  [18] 18 (09/26 0609) BP: (133)/(89) 133/89 (09/26 0609) SpO2:  [95 %] 95 % (09/26 0609) Estimated body mass index is 34.51 kg/m as calculated from Ann following:   Height as of 03/24/18: 5' (1.524 m).  Weight as of 03/24/18: 80.2 kg.   General Appearance: Alert pleasant, obese. Head: Normocephalic, without obvious abnormality, atraumatic Eyes: PERRL, conjunctiva/corneas clear, EOM's intact,    Ears: Normal  Throat: Normal  Neck: Supple, Back: Her lumbar incision is well-healed. Lungs: Clear to auscultation bilaterally, respirations unlabored Heart: Regular rate and rhythm, no murmur, rub or gallop Abdomen: Soft, non-tender Extremities: Extremities normal, atraumatic, no cyanosis or edema Skin: unremarkable  NEUROLOGIC:   Mental status: alert and oriented,Motor Exam - grossly normal Sensory Exam - grossly normal Reflexes:  Coordination - grossly normal Gait - grossly normal Balance - grossly normal Cranial Nerves: I: smell Not tested  II: visual acuity  OS: Normal  OD: Normal   II: visual fields Full to confrontation  II: pupils Equal, round, reactive to light  III,VII: ptosis None  III,IV,VI: extraocular muscles  Full ROM  V: mastication Normal  V: facial light touch sensation  Normal  V,VII: corneal reflex  Present  VII: facial muscle function - upper  Normal  VII: facial muscle function - lower Normal  VIII: hearing Not tested  IX: soft palate elevation  Normal  IX,X: gag reflex Present  XI: trapezius strength  5/5  XI: sternocleidomastoid strength 5/5  XI: neck flexion strength  5/5  XII: tongue strength  Normal    Data Review Lab Results  Component Value Date   WBC 6.9 03/24/2018   HGB 13.8 03/24/2018   HCT 42.6 03/24/2018   MCV 94.9 03/24/2018   PLT 170 03/24/2018   Lab Results  Component Value Date   NA  139 03/24/2018   K 4.9 03/24/2018   CL 105 03/24/2018   CO2 26 03/24/2018   BUN 25 (H) 03/24/2018   CREATININE 0.97 03/24/2018   GLUCOSE 85 03/24/2018   Lab Results  Component Value Date   INR 1.19 12/28/2016    Assessment/Plan: L3-4 spondylolisthesis, spinal stenosis, lumbago, lumbar radiculopathy, neurogenic claudication: I have discussed Ann situation with Ann Silva and reviewed her imaging studies with her.  We have discussed Ann various treatment options including surgery.  I have described Ann surgical treatment option of an L3-4 decompression, instrumentation, and fusion.  I have shown her surgical models.  I have given her surgical pamphlet.  We have discussed Ann risks, benefits, alternatives, expected postoperative course, and likelihood of achieving our goals with surgery.  I have answered all her questions.  She has decided to proceed with surgery.   Ophelia Charter 04/02/2018 7:12 AM

## 2018-04-02 NOTE — Transfer of Care (Signed)
Immediate Anesthesia Transfer of Care Note  Patient: Ann Silva  Procedure(s) Performed: POSTERIOR LUMBAR INTERBODY FUSION, INTERBODY PROSTHESIS, POSTERIOR INSTRUMENTATION/FUSION LUMBAR THREE- LUMBAR FOUR; EXPLORE FUSION (N/A Spine Lumbar)  Patient Location: PACU  Anesthesia Type:General  Level of Consciousness: awake and patient cooperative  Airway & Oxygen Therapy: Patient Spontanous Breathing and Patient connected to face mask oxygen  Post-op Assessment: Report given to RN and Post -op Vital signs reviewed and stable  Post vital signs: Reviewed and stable  Last Vitals:  Vitals Value Taken Time  BP 146/82 04/02/2018 11:12 AM  Temp    Pulse 82 04/02/2018 11:13 AM  Resp 17 04/02/2018 11:13 AM  SpO2 96 % 04/02/2018 11:13 AM  Vitals shown include unvalidated device data.  Last Pain:  Vitals:   04/02/18 0627  TempSrc:   PainSc: 8          Complications: No apparent anesthesia complications

## 2018-04-03 LAB — BASIC METABOLIC PANEL
Anion gap: 7 (ref 5–15)
BUN: 26 mg/dL — ABNORMAL HIGH (ref 8–23)
CALCIUM: 10.1 mg/dL (ref 8.9–10.3)
CHLORIDE: 104 mmol/L (ref 98–111)
CO2: 28 mmol/L (ref 22–32)
Creatinine, Ser: 0.86 mg/dL (ref 0.44–1.00)
GFR calc non Af Amer: >60 mL/min (ref >60)
Glucose, Bld: 154 mg/dL — ABNORMAL HIGH (ref 70–99)
Potassium: 4.2 mmol/L (ref 3.5–5.1)
Sodium: 139 mmol/L (ref 135–145)

## 2018-04-03 LAB — CBC
HEMATOCRIT: 37 % (ref 36.0–46.0)
HEMOGLOBIN: 11.9 g/dL — AB (ref 12.0–15.0)
MCH: 31 pg (ref 26.0–34.0)
MCHC: 32.2 g/dL (ref 30.0–36.0)
MCV: 96.4 fL (ref 78.0–100.0)
Platelets: 161 10*3/uL (ref 150–400)
RBC: 3.84 MIL/uL — ABNORMAL LOW (ref 3.87–5.11)
RDW: 14.5 % (ref 11.5–15.5)
WBC: 11.1 10*3/uL — ABNORMAL HIGH (ref 4.0–10.5)

## 2018-04-03 MED FILL — Sodium Chloride IV Soln 0.9%: INTRAVENOUS | Qty: 1000 | Status: AC

## 2018-04-03 MED FILL — Heparin Sodium (Porcine) Inj 1000 Unit/ML: INTRAMUSCULAR | Qty: 30 | Status: AC

## 2018-04-03 NOTE — Progress Notes (Signed)
Subjective: The patient is alert and pleasant.  She looks well.  She wants to go home tomorrow.  Objective: Vital signs in last 24 hours: Temp:  [97.7 F (36.5 C)-99.1 F (37.3 C)] 98.3 F (36.8 C) (09/27 0728) Pulse Rate:  [51-78] 51 (09/27 0728) Resp:  [8-18] 16 (09/27 0728) BP: (92-146)/(53-82) 118/70 (09/27 0728) SpO2:  [92 %-98 %] 92 % (09/27 0728) Weight:  [80.2 kg] 80.2 kg (09/26 1308) Estimated body mass index is 34.51 kg/m as calculated from the following:   Height as of this encounter: 5' (1.524 m).   Weight as of this encounter: 80.2 kg.   Intake/Output from previous day: 09/26 0701 - 09/27 0700 In: 1203 [I.V.:1203] Out: 800 [Urine:600; Blood:200] Intake/Output this shift: No intake/output data recorded.  Physical exam the patient is alert and oriented.  She is moving her lower extremities well.  Lab Results: Recent Labs    04/03/18 0650  WBC 11.1*  HGB 11.9*  HCT 37.0  PLT 161   BMET Recent Labs    04/03/18 0650  NA 139  K 4.2  CL 104  CO2 28  GLUCOSE 154*  BUN 26*  CREATININE 0.86  CALCIUM 10.1    Studies/Results: Dg Lumbar Spine 2-3 Views  Result Date: 04/02/2018 CLINICAL DATA:  Status post surgical posterior fusion of L3-4. EXAM: LUMBAR SPINE - 2-3 VIEW; DG C-ARM 61-120 MIN Radiation exposure index: 7.53 mGy. COMPARISON:  Fluoroscopic images of December 25, 2016. FINDINGS: Two intraoperative fluoroscopic images of the lower lumbar spine demonstrate the patient be status post interval posterior fusion of L3-4 with bilateral intrapedicular screw placement and interbody fusion. IMPRESSION: Interval posterior fusion of L3-4. Electronically Signed   By: Marijo Conception, M.D.   On: 04/02/2018 12:00   Dg C-arm 1-60 Min  Result Date: 04/02/2018 CLINICAL DATA:  Status post surgical posterior fusion of L3-4. EXAM: LUMBAR SPINE - 2-3 VIEW; DG C-ARM 61-120 MIN Radiation exposure index: 7.53 mGy. COMPARISON:  Fluoroscopic images of December 25, 2016. FINDINGS: Two  intraoperative fluoroscopic images of the lower lumbar spine demonstrate the patient be status post interval posterior fusion of L3-4 with bilateral intrapedicular screw placement and interbody fusion. IMPRESSION: Interval posterior fusion of L3-4. Electronically Signed   By: Marijo Conception, M.D.   On: 04/02/2018 12:00    Assessment/Plan: Postop day #1: The patient is doing well.  We will mobilize her with PT and OT.  She will likely go home tomorrow.  LOS: 1 day     Ann Silva 04/03/2018, 7:43 AM

## 2018-04-03 NOTE — Evaluation (Signed)
Occupational Therapy Evaluation and Discharge Patient Details Name: Ann Silva MRN: [id number] DOB: Aug 12, 1945 Today's Date: 04/03/2018    History of Present Illness Pt is a 72 y.o. female s/p PLIF @ L3-4. PMH is significant for  anemia, colon cancer,COPD, heart murmur, HTN, and panic attacks.    Clinical Impression   Pt is currently mod to max A for LB ADL - plans on being assisted by husband and daughter and familiar with AE (re-education provided). Supervision for all transfers, mobility with 3 wheeled RW, and standing ADL tasks. Back handout provided and reviewed adls in detail. Pt educated on: clothing between brace, never sleep in brace, set an alarm at night for medication, avoid sitting for long periods of time, correct bed positioning for sleeping, correct sequence for bed mobility, avoiding lifting more than 5 pounds and never wash directly over incision. All education is complete and patient/husband indicate understanding. They state they have all the DME they need. OT to sign off at this time. Thank you for the opportunity to serve this patient.      Follow Up Recommendations  No OT follow up;Supervision/Assistance - 24 hour(initially)    Equipment Recommendations  None recommended by OT(Pt has appropriate DME)    Recommendations for Other Services       Precautions / Restrictions Precautions Precautions: Back Precaution Booklet Issued: Yes (comment) Precaution Comments: Reviewed precautions in full with pt. Required Braces or Orthoses: Spinal Brace Spinal Brace: Lumbar corset;Applied in standing position Restrictions Weight Bearing Restrictions: No      Mobility Bed Mobility Overal bed mobility: Needs Assistance Bed Mobility: Rolling;Sidelying to Sit Rolling: Supervision Sidelying to sit: Min guard;HOB elevated(increased time required)       General bed mobility comments: HOB elevated as pt has an adjustable bed at d/c  Transfers Overall transfer level:  Needs assistance Equipment used: (3 wheeled RW) Transfers: Sit to/from Stand Sit to Stand: Supervision         General transfer comment: vc for hand safety    Balance Overall balance assessment: Mild deficits observed, not formally tested                                         ADL either performed or assessed with clinical judgement   ADL Overall ADL's : Needs assistance/impaired Eating/Feeding: Independent   Grooming: Modified independent;Standing Grooming Details (indicate cue type and reason): educated in compensatory strategies for sink level grooming Upper Body Bathing: Supervision/ safety;With adaptive equipment;With caregiver independent assisting;Sitting   Lower Body Bathing: Set up;With adaptive equipment;With caregiver independent assisting;Sit to/from stand;Adhering to back precautions Lower Body Bathing Details (indicate cue type and reason): Pt familiar with long handle sponge Upper Body Dressing : Minimal assistance;With caregiver independent assisting;Standing Upper Body Dressing Details (indicate cue type and reason): to don brace, initially put on in sitting, then adjusted in standing Lower Body Dressing: Moderate assistance;With caregiver independent assisting;Sit to/from stand Lower Body Dressing Details (indicate cue type and reason): husband plans on assisting with dressing, familiar with AE for LB ADL Toilet Transfer: Supervision/safety;Ambulation;Comfort height toilet;Grab bars(three wheeled RW) Toilet Transfer Details (indicate cue type and reason): no assist needed Toileting- Clothing Manipulation and Hygiene: Min guard;Sit to/from stand       Functional mobility during ADLs: Supervision/safety;Rolling walker(3 wheeled)       Vision Patient Visual Report: No change from baseline       Perception  Praxis      Pertinent Vitals/Pain Pain Assessment: Faces Faces Pain Scale: Hurts a little bit Pain Location: Incision Pain  Descriptors / Indicators: Discomfort;Sore;Grimacing Pain Intervention(s): Monitored during session;Repositioned     Hand Dominance     Extremity/Trunk Assessment Upper Extremity Assessment Upper Extremity Assessment: Overall WFL for tasks assessed   Lower Extremity Assessment Lower Extremity Assessment: Overall WFL for tasks assessed   Cervical / Trunk Assessment Cervical / Trunk Assessment: Other exceptions Cervical / Trunk Exceptions: s/p lumbar surgery   Communication Communication Communication: No difficulties   Cognition Arousal/Alertness: Awake/alert Behavior During Therapy: WFL for tasks assessed/performed Overall Cognitive Status: Within Functional Limits for tasks assessed                                     General Comments  husband present throughout session    Exercises     Shoulder Instructions      Home Living Family/patient expects to be discharged to:: Private residence Living Arrangements: Spouse/significant other Available Help at Discharge: Family;Available 24 hours/day Type of Home: Mobile home Home Access: Ramped entrance     Home Layout: One level     Bathroom Shower/Tub: Occupational psychologist: Handicapped height     Home Equipment: Environmental consultant - 2 wheels;Walker - 4 wheels;Cane - single point;Wheelchair - Press photographer;Other (comment)(3 wheeled RW)   Additional Comments: Pt reports her husband will be with her 24 hours of the day for the first few days and then will have 24 hour care from daughter      Prior Functioning/Environment Level of Independence: Independent with assistive device(s)        Comments: Utilized 2 wheeled RW in house and 3 wheeled RW in community.        OT Problem List:        OT Treatment/Interventions:      OT Goals(Current goals can be found in the care plan section) Acute Rehab OT Goals Patient Stated Goal: to go home tomorrow OT Goal Formulation: With  patient/family Time For Goal Achievement: 04/17/18 Potential to Achieve Goals: Good  OT Frequency:     Barriers to D/C:            Co-evaluation              AM-PAC PT "6 Clicks" Daily Activity     Outcome Measure Help from another person eating meals?: None Help from another person taking care of personal grooming?: None Help from another person toileting, which includes using toliet, bedpan, or urinal?: None Help from another person bathing (including washing, rinsing, drying)?: A Little Help from another person to put on and taking off regular upper body clothing?: A Little Help from another person to put on and taking off regular lower body clothing?: A Lot 6 Click Score: 20   End of Session Equipment Utilized During Treatment: Rolling walker;Back brace Nurse Communication: Mobility status  Activity Tolerance: Patient tolerated treatment well Patient left: in chair;with family/visitor present                   Time: 1250-1315 OT Time Calculation (min): 25 min Charges:  OT General Charges $OT Visit: 1 Visit OT Evaluation $OT Eval Moderate Complexity: 1 Mod OT Treatments $Self Care/Home Management : 8-22 mins  Hulda Humphrey OTR/L Acute Rehabilitation Services Pager: [phone] Office: [place] 04/03/2018, 2:31 PM

## 2018-04-03 NOTE — Progress Notes (Signed)
Physical Therapy Evaluation Patient Details Name: Ann Silva MRN: [id number] DOB: August 14, 1945 Today's Date: 04/03/2018   History of Present Illness  Pt is a 72 y.o. female s/p PLIF @ L3-4. PMH is significant for anemia, colon cancer,COPD, heart murmur, HTN, and panic attacks.   Clinical Impression  Patient is s/p above surgery resulting in functional limitations due to the deficits listed below (see PT Problem List). At time of eval pt performed transfers and ambulation with gross min G to supervision with 3 wheeled RW. Pt educated on positioning, car transfers, generalized walking program, and overall safety with mobility with an emphasis on utilizing 2 wheeled RW for increased stability at d/c. Acutely, patient will benefit from skilled PT to increase their independence and safety with mobility to allow discharge to the venue listed below.       Follow Up Recommendations No PT follow up;Supervision for mobility/OOB    Equipment Recommendations  None recommended by PT    Recommendations for Other Services       Precautions / Restrictions Precautions Precautions: Back Precaution Booklet Issued: Yes (comment) Precaution Comments: Reviewed precautions in full with pt. Required Braces or Orthoses: Spinal Brace Spinal Brace: Lumbar corset;Applied in standing position Restrictions Weight Bearing Restrictions: No      Mobility  Bed Mobility Overal bed mobility: Needs Assistance Bed Mobility: Rolling;Sidelying to Sit Rolling: Supervision Sidelying to sit: Min guard;HOB elevated       General bed mobility comments: HOB elevated as pt has an adjustable bed at d/c. Min cues for log roll sequencing as pt attempted to sit straight up.   Transfers Overall transfer level: Needs assistance Equipment used: (3 wheeled RW) Transfers: Sit to/from Stand Sit to Stand: Supervision;Min guard         General transfer comment: Min guard initially as pt attempting to use 3 wheeled RW  without locking the breaks prior to standing. Progressed to supervision for safety. Min cues for proper hand placement  Ambulation/Gait Ambulation/Gait assistance: Min guard Gait Distance (Feet): 300 Feet Assistive device: (3 wheeled RW) Gait Pattern/deviations: Step-through pattern;Trunk flexed Gait velocity: decreased Gait velocity interpretation: <1.31 ft/sec, indicative of household ambulator General Gait Details: Pt required cues throughout for upright posture and proximity to RW. Pt reported she felt most comfortable with 3 wheeled RW when ambulating longer distances and preferred 2 wheeled RW for shorter distances. Discussed safety concerns with 3 wheeled RW vs. 2 wheeled RW.  Stairs            Wheelchair Mobility    Modified Rankin (Stroke Patients Only)       Balance Overall balance assessment: Mild deficits observed, not formally tested                                           Pertinent Vitals/Pain Pain Assessment: Faces Faces Pain Scale: Hurts a little bit Pain Location: Incision Pain Descriptors / Indicators: Aching;Grimacing;Discomfort Pain Intervention(s): Limited activity within patient's tolerance;Monitored during session;Repositioned    Home Living Family/patient expects to be discharged to:: Private residence Living Arrangements: Spouse/significant other Available Help at Discharge: Family;Available 24 hours/day Type of Home: Mobile home Home Access: Ramped entrance     Home Layout: One level Home Equipment: [place] - 2 wheels;Walker - 4 wheels;Cane - single point;Wheelchair - Press photographer;Other (comment)(3 wheeled RW) Additional Comments: Pt reports her husband will be with her 24 hours of the  day for the first few days and then will have 24 hour care from daughter    Prior Function Level of Independence: Independent with assistive device(s)         Comments: Utilized 2 wheeled RW in house and 3 wheeled RW in  community.     Hand Dominance        Extremity/Trunk Assessment   Upper Extremity Assessment Upper Extremity Assessment: Overall WFL for tasks assessed    Lower Extremity Assessment Lower Extremity Assessment: Overall WFL for tasks assessed    Cervical / Trunk Assessment Cervical / Trunk Assessment: Other exceptions Cervical / Trunk Exceptions: s/p lumbar surgery  Communication   Communication: No difficulties  Cognition Arousal/Alertness: Awake/alert Behavior During Therapy: WFL for tasks assessed/performed Overall Cognitive Status: Within Functional Limits for tasks assessed                                        General Comments      Exercises     Assessment/Plan    PT Assessment Patient needs continued PT services  PT Problem List Decreased strength;Decreased range of motion;Decreased activity tolerance;Decreased balance;Decreased mobility;Decreased knowledge of use of DME;Decreased knowledge of precautions;Decreased safety awareness;Pain       PT Treatment Interventions DME instruction;Gait training;Functional mobility training;Therapeutic activities;Therapeutic exercise;Stair training;Balance training;Patient/family education;Manual techniques;Modalities    PT Goals (Current goals can be found in the Care Plan section)  Acute Rehab PT Goals Patient Stated Goal: to go home tomorrow PT Goal Formulation: With patient Time For Goal Achievement: 04/10/18 Potential to Achieve Goals: Good    Frequency Min 5X/week   Barriers to discharge        Co-evaluation               AM-PAC PT "6 Clicks" Daily Activity  Outcome Measure Difficulty turning over in bed (including adjusting bedclothes, sheets and blankets)?: A Little Difficulty moving from lying on back to sitting on the side of the bed? : A Little Difficulty sitting down on and standing up from a chair with arms (e.g., wheelchair, bedside commode, etc,.)?: Unable Help needed moving  to and from a bed to chair (including a wheelchair)?: A Little Help needed walking in hospital room?: A Little Help needed climbing 3-5 steps with a railing? : A Lot 6 Click Score: 15    End of Session Equipment Utilized During Treatment: Gait belt;Back brace Activity Tolerance: Patient tolerated treatment well Patient left: in chair;with call bell/phone within reach Nurse Communication: Mobility status PT Visit Diagnosis: Other abnormalities of gait and mobility (R26.89);Difficulty in walking, not elsewhere classified (R26.2);Pain Pain - part of body: (incision)    Time: 4193-7902 PT Time Calculation (min) (ACUTE ONLY): 46 min   Charges:   PT Evaluation $PT Eval Moderate Complexity: 1 Mod PT Treatments $Gait Training: 23-37 mins        Einar Crow, [place]  Student Physical Therapist Acute Rehab [phone]   Einar Crow 04/03/2018, 2:03 PM

## 2018-04-03 NOTE — Progress Notes (Signed)
Patient inspiratory flow checked, -35 L/Min.  Inspiratory flow adequate for effective medication administration.

## 2018-04-04 MED ORDER — CYCLOBENZAPRINE HCL 10 MG PO TABS: 10.0000 mg | ORAL_TABLET | Freq: Three times a day (TID) | ORAL | 0 refills | Status: DC | PRN

## 2018-04-04 MED ORDER — OXYCODONE HCL 10 MG PO TABS: 10.0000 mg | ORAL_TABLET | ORAL | 0 refills | Status: DC | PRN

## 2018-04-04 MED ORDER — DOCUSATE SODIUM 100 MG PO CAPS: 100.0000 mg | ORAL_CAPSULE | Freq: Two times a day (BID) | ORAL | 0 refills | Status: DC

## 2018-04-04 NOTE — Discharge Summary (Signed)
Physician Discharge Summary  Patient ID: Ann Silva MRN: [id number] DOB/AGE: 1945/08/04 72 y.o.  Admit date: 04/02/2018 Discharge date: 04/04/2018  Admission Diagnoses: L3-4 spondylolisthesis, spinal stenosis, adjacent segment disease, lumbago, lumbar radiculopathy  Discharge Diagnoses: The same Active Problems:   Lumbar adjacent segment disease with spondylolisthesis   Discharged Condition: good  Hospital Course: I performed an L3-4 decompression, instrumentation and fusion on the patient on 04/02/2018.  The surgery went well.  The patient's postoperative course was unremarkable.  On postoperative day #2 she requested discharge to home.  She was given oral and written discharge instructions.  All her questions were answered.  Consults: Physical therapy, Occupational Therapy Significant Diagnostic Studies: None Treatments: L3-4 decompression, instrumentation, fusion, exploration of lumbar fusion Discharge Exam: Blood pressure 129/68, pulse 60, temperature 99.2 F (37.3 C), temperature source Oral, resp. rate 18, height 5' (1.524 m), weight 80.2 kg, SpO2 94 %. Patient is alert and pleasant.  She looks well.  Her lower extremity strength is normal.  Disposition: Home  Discharge Instructions    Call MD for:  difficulty breathing, headache or visual disturbances   Complete by:  As directed    Call MD for:  extreme fatigue   Complete by:  As directed    Call MD for:  hives   Complete by:  As directed    Call MD for:  persistant dizziness or light-headedness   Complete by:  As directed    Call MD for:  persistant nausea and vomiting   Complete by:  As directed    Call MD for:  redness, tenderness, or signs of infection (pain, swelling, redness, odor or green/yellow discharge around incision site)   Complete by:  As directed    Call MD for:  severe uncontrolled pain   Complete by:  As directed    Call MD for:  temperature >100.4   Complete by:  As directed    Diet - low  sodium heart healthy   Complete by:  As directed    Discharge instructions   Complete by:  As directed    Call [phone] for a followup appointment. Take a stool softener while you are using pain medications.   Driving Restrictions   Complete by:  As directed    Do not drive for 2 weeks.   Increase activity slowly   Complete by:  As directed    Lifting restrictions   Complete by:  As directed    Do not lift more than 5 pounds. No excessive bending or twisting.   May shower / Bathe   Complete by:  As directed    Remove the dressing for 3 days after surgery.  You may shower, but leave the incision alone.   Remove dressing in 24 hours   Complete by:  As directed      Allergies as of 04/04/2018      Reactions   Hydrocodone-chlorpheniramine Shortness Of Breath   Sulfa Antibiotics Shortness Of Breath   Chlorhexidine Rash   Methadone Other (See Comments)   Altered Mental Status   Benadryl [diphenhydramine] Anxiety   Latex Rash   Tape Rash   "the white kind they use after surgery"      Medication List    STOP taking these medications   acetaminophen 500 MG tablet Commonly known as:  TYLENOL   naproxen 375 MG tablet Commonly known as:  NAPROSYN   NAPROXEN DR 500 MG EC tablet Generic drug:  naproxen   oxyCODONE-acetaminophen 5-325 MG tablet  Commonly known as:  PERCOCET/ROXICET     TAKE these medications   albuterol 108 (90 Base) MCG/ACT inhaler Commonly known as:  PROVENTIL HFA;VENTOLIN HFA Inhale 2 puffs into the lungs every 4 (four) hours as needed for wheezing or shortness of breath.   aspirin EC 81 MG tablet Take 81 mg by mouth daily.   budesonide-formoterol 160-4.5 MCG/ACT inhaler Commonly known as:  SYMBICORT Inhale 2 puffs into the lungs 2 (two) times daily. What changed:    when to take this  reasons to take this   cyclobenzaprine 10 MG tablet Commonly known as:  FLEXERIL Take 1 tablet (10 mg total) by mouth 3 (three) times daily as needed for  muscle spasms.   docusate sodium 100 MG capsule Commonly known as:  COLACE Take 1 capsule (100 mg total) by mouth 2 (two) times daily.   fluticasone 50 MCG/ACT nasal spray Commonly known as:  FLONASE Place 1 spray into both nostrils daily as needed (for sinus issues).   furosemide 20 MG tablet Commonly known as:  LASIX Take 1 tablet (20 mg total) by mouth daily. What changed:  when to take this   gabapentin 100 MG capsule Commonly known as:  NEURONTIN Take 200 mg by mouth at bedtime.   ipratropium-albuterol 0.5-2.5 (3) MG/3ML Soln Commonly known as:  DUONEB Take 3 mLs by nebulization every 6 (six) hours as needed (for wheezing/shortness of breath).   methocarbamol 500 MG tablet Commonly known as:  ROBAXIN Take 1-2 tablets (500-1,000 mg total) by mouth 4 (four) times daily as needed for muscle spasms. What changed:  how much to take   metoprolol succinate 50 MG 24 hr tablet Commonly known as:  TOPROL-XL Take 1 tablet (50 mg total) by mouth every evening. Take with or immediately following a meal. What changed:  when to take this   Oxycodone HCl 10 MG Tabs Take 1 tablet (10 mg total) by mouth every 4 (four) hours as needed for severe pain ((score 7 to 10)). What changed:    medication strength  how much to take  reasons to take this   PARoxetine 40 MG tablet Commonly known as:  PAXIL Take 40 mg by mouth at bedtime.   rOPINIRole 3 MG tablet Commonly known as:  REQUIP Take 1 tablet (3 mg total) by mouth 4 (four) times daily.   spironolactone 25 MG tablet Commonly known as:  ALDACTONE Take 25 mg by mouth every evening.        Signed: Ophelia Charter 04/04/2018, 7:12 AM

## 2018-04-04 NOTE — Discharge Instructions (Signed)

## 2018-04-04 NOTE — Progress Notes (Signed)
Patient is discharged from room 3C03 at this time. Alert and in stable condition. IV site d/c'[d and instructions read to patient and spouse with understanding verbalized. Left unit via wheelchair with all belongings at side. 

## 2018-04-04 NOTE — Progress Notes (Signed)
Physical Therapy Treatment Patient Details Name: Ann Silva MRN: [id number] DOB: Jul 29, 1945 Today's Date: 04/04/2018    History of Present Illness Pt is a 72 y.o. female s/p PLIF @ L3-4. PMH is significant for     PT Comments     Patient demonstrating progress towards physical therapy goals during her inpatient stay and is adequate for discharge due to being at baseline level of functioning and having adequate caregiver support at home. Ambulating 400 feet with 3 wheeled walker and supervision. Reviewed spinal precautions with ADL's and patient verbalized understanding. Patient with no further questions/concerns. No further acute PT needs; PT signing off.    Follow Up Recommendations  No PT follow up;Supervision for mobility/OOB     Equipment Recommendations  None recommended by PT    Recommendations for Other Services       Precautions / Restrictions Precautions Precautions: Back Precaution Booklet Issued: Yes (comment) Precaution Comments: Patient able to recall 2/3 precautions. Verbally reviewed Required Braces or Orthoses: Spinal Brace Spinal Brace: Lumbar corset;Applied in standing position Restrictions Weight Bearing Restrictions: No    Mobility  Bed Mobility Overal bed mobility: Modified Independent Bed Mobility: Rolling;Sidelying to Sit Rolling: Modified independent (Device/Increase time) Sidelying to sit: Modified independent (Device/Increase time)       General bed mobility comments: Cues for log roll technique  Transfers Overall transfer level: Needs assistance Equipment used: (3 wheeled RW) Transfers: Sit to/from Stand Sit to Stand: Supervision         General transfer comment: vc for hand safety and locking walker prior to transfers  Ambulation/Gait Ambulation/Gait assistance: Supervision Gait Distance (Feet): 400 Feet Assistive device: (3 wheeled walker) Gait Pattern/deviations: Step-through pattern;Shuffle;Decreased stride length Gait  velocity: decreased   General Gait Details: Patient requiring frequent verbal cueing for increased stride length and foot clearance; tends to worsen with distraction   Stairs             Wheelchair Mobility    Modified Rankin (Stroke Patients Only)       Balance Overall balance assessment: Mild deficits observed, not formally tested                                          Cognition Arousal/Alertness: Awake/alert Behavior During Therapy: WFL for tasks assessed/performed Overall Cognitive Status: Within Functional Limits for tasks assessed                                        Exercises      General Comments        Pertinent Vitals/Pain Pain Assessment: Faces Faces Pain Scale: Hurts a little bit Pain Location: Incision Pain Descriptors / Indicators: Discomfort;Sore;Grimacing Pain Intervention(s): Monitored during session    Home Living                      Prior Function            PT Goals (current goals can now be found in the care plan section) Acute Rehab PT Goals Patient Stated Goal: to go home tomorrow PT Goal Formulation: With patient Time For Goal Achievement: 04/10/18 Potential to Achieve Goals: Good Progress towards PT goals: Goals met/education completed, patient discharged from PT    Frequency    Other (Comment)(d/c)  PT Plan Other (comment)(d/c therapies)    Co-evaluation              AM-PAC PT "6 Clicks" Daily Activity  Outcome Measure  Difficulty turning over in bed (including adjusting bedclothes, sheets and blankets)?: A Little Difficulty moving from lying on back to sitting on the side of the bed? : A Little Difficulty sitting down on and standing up from a chair with arms (e.g., wheelchair, bedside commode, etc,.)?: A Little Help needed moving to and from a bed to chair (including a wheelchair)?: A Little Help needed walking in hospital room?: A Little Help needed  climbing 3-5 steps with a railing? : A Little 6 Click Score: 18    End of Session Equipment Utilized During Treatment: Gait belt;Back brace Activity Tolerance: Patient tolerated treatment well Patient left: in bed;with call bell/phone within reach Nurse Communication: Mobility status PT Visit Diagnosis: Other abnormalities of gait and mobility (R26.89);Difficulty in walking, not elsewhere classified (R26.2);Pain Pain - part of body: (back)     Time: 1884-1660 PT Time Calculation (min) (ACUTE ONLY): 21 min  Charges:  $Gait Training: 8-22 mins                    Ellamae Sia, [place], DPT Acute Rehabilitation Services Pager [phone] Office [phone]    Willy Eddy 04/04/2018, 11:12 AM

## 2018-04-08 ENCOUNTER — Encounter (HOSPITAL_COMMUNITY): Payer: Self-pay | Admitting: Emergency Medicine

## 2018-04-08 ENCOUNTER — Emergency Department (HOSPITAL_COMMUNITY)
Admission: EM | Admit: 2018-04-08 | Discharge: 2018-04-08 | Disposition: A | Discharge status: Home / Self Care | Payer: Medicare HMO | Attending: Emergency Medicine | Admitting: Emergency Medicine

## 2018-04-08 ENCOUNTER — Emergency Department (HOSPITAL_COMMUNITY): Payer: Medicare HMO

## 2018-04-08 ENCOUNTER — Other Ambulatory Visit: Payer: Self-pay

## 2018-04-08 DIAGNOSIS — F17210 Nicotine dependence, cigarettes, uncomplicated: Secondary | ICD-10-CM | POA: Diagnosis not present

## 2018-04-08 DIAGNOSIS — I2510 Atherosclerotic heart disease of native coronary artery without angina pectoris: Secondary | ICD-10-CM | POA: Insufficient documentation

## 2018-04-08 DIAGNOSIS — Z043 Encounter for examination and observation following other accident: Secondary | ICD-10-CM | POA: Insufficient documentation

## 2018-04-08 DIAGNOSIS — R531 Weakness: Secondary | ICD-10-CM | POA: Diagnosis present

## 2018-04-08 DIAGNOSIS — J449 Chronic obstructive pulmonary disease, unspecified: Secondary | ICD-10-CM | POA: Diagnosis not present

## 2018-04-08 DIAGNOSIS — Z96641 Presence of right artificial hip joint: Secondary | ICD-10-CM | POA: Insufficient documentation

## 2018-04-08 DIAGNOSIS — W19XXXA Unspecified fall, initial encounter: Secondary | ICD-10-CM

## 2018-04-08 DIAGNOSIS — I10 Essential (primary) hypertension: Secondary | ICD-10-CM | POA: Diagnosis not present

## 2018-04-08 DIAGNOSIS — Z91040 Latex allergy status: Secondary | ICD-10-CM | POA: Insufficient documentation

## 2018-04-08 DIAGNOSIS — Z79899 Other long term (current) drug therapy: Secondary | ICD-10-CM | POA: Insufficient documentation

## 2018-04-08 LAB — URINALYSIS, ROUTINE W REFLEX MICROSCOPIC
BILIRUBIN URINE: NEGATIVE
Glucose, UA: NEGATIVE mg/dL
Hgb urine dipstick: NEGATIVE
Ketones, ur: NEGATIVE mg/dL
Leukocytes, UA: NEGATIVE
NITRITE: NEGATIVE
PROTEIN: NEGATIVE mg/dL
Specific Gravity, Urine: 1.016 (ref 1.005–1.030)
pH: 5 (ref 5.0–8.0)

## 2018-04-08 LAB — COMPREHENSIVE METABOLIC PANEL
ALT: 21 U/L (ref 0–44)
AST: 22 U/L (ref 15–41)
Albumin: 2.9 g/dL — ABNORMAL LOW (ref 3.5–5.0)
Alkaline Phosphatase: 84 U/L (ref 38–126)
Anion gap: 9 (ref 5–15)
BILIRUBIN TOTAL: 0.4 mg/dL (ref 0.3–1.2)
BUN: 17 mg/dL (ref 8–23)
CO2: 25 mmol/L (ref 22–32)
CREATININE: 0.82 mg/dL (ref 0.44–1.00)
Calcium: 10.1 mg/dL (ref 8.9–10.3)
Chloride: 101 mmol/L (ref 98–111)
Glucose, Bld: 99 mg/dL (ref 70–99)
POTASSIUM: 3.7 mmol/L (ref 3.5–5.1)
Sodium: 135 mmol/L (ref 135–145)
TOTAL PROTEIN: 6.4 g/dL — AB (ref 6.5–8.1)

## 2018-04-08 LAB — CBC WITH DIFFERENTIAL/PLATELET
Abs Immature Granulocytes: 0.1 10*3/uL (ref 0.0–0.1)
BASOS PCT: 0 %
Basophils Absolute: 0 10*3/uL (ref 0.0–0.1)
EOS ABS: 0.4 10*3/uL (ref 0.0–0.7)
Eosinophils Relative: 7 %
HEMATOCRIT: 34.4 % — AB (ref 36.0–46.0)
Hemoglobin: 11 g/dL — ABNORMAL LOW (ref 12.0–15.0)
IMMATURE GRANULOCYTES: 1 %
LYMPHS ABS: 1.1 10*3/uL (ref 0.7–4.0)
Lymphocytes Relative: 19 %
MCH: 31 pg (ref 26.0–34.0)
MCHC: 32 g/dL (ref 30.0–36.0)
MCV: 96.9 fL (ref 78.0–100.0)
MONO ABS: 0.5 10*3/uL (ref 0.1–1.0)
MONOS PCT: 9 %
Neutro Abs: 3.5 10*3/uL (ref 1.7–7.7)
Neutrophils Relative %: 64 %
Platelets: 215 10*3/uL (ref 150–400)
RBC: 3.55 MIL/uL — ABNORMAL LOW (ref 3.87–5.11)
RDW: 14.2 % (ref 11.5–15.5)
WBC: 5.5 10*3/uL (ref 4.0–10.5)

## 2018-04-08 LAB — CK: Total CK: 55 U/L (ref 38–234)

## 2018-04-08 LAB — I-STAT TROPONIN, ED: Troponin i, poc: 0 ng/mL (ref 0.00–0.08)

## 2018-04-08 MED ORDER — FENTANYL CITRATE (PF) 100 MCG/2ML IJ SOLN
50.0000 ug | Freq: Once | INTRAMUSCULAR | Status: AC
  Administered 2018-04-08: 50 ug via INTRAVENOUS
  Filled 2018-04-08: qty 2

## 2018-04-08 NOTE — ED Provider Notes (Signed)
[facility] Provider Note   CSN: [id number] Arrival date & time: 04/08/18  0807     History   Chief Complaint Chief Complaint  Patient presents with  . Fall    HPI Ann Silva is a 72 y.o. female with lumbar spinal stenosis s/p surgery 6 days ago, copd, CAD, HTN, panic attacks who presents after a fall at home. She uses a walker since her recent back surgery. She reports walking to the bathroom with her walker this morning, the house was dark, and she hit her left hip/thigh on a nightstand and fell to the ground, falling on her left side. She denies head trauma. She doesn't think she lost consciousness but she is unsure. She yelled for her daughter who was in the other bedroom. The daughter was unable to get the patient off the floor so they called the patient's husband home from work and the two of them were able to pick her up off the floor.   Ann Silva is somnolent but easily aroussable and oriented. She reports getting little sleep last night and feeling generalized weakness since yesterday and a low grade temperature yesterday that self resolved. She has throbbing pain with movement of the left knee. Her back pain is not acutely worse and she denies any other joint pain.   She takes oxycodone for chronic pain but denies taking more than prescribed. Of note, she has had bilateral hip replacements several years ago.   ROS is positive for mild suprapubic pain, low grade temp yesterday, nonproductive cough  ROS is negative for sob, chest pain, n/v/d, dysuria, chills   Past Medical History:  Diagnosis Date  . Anemia    takes ferrous sulfate daily  . Anxiety   . Arthritis   . Bladder spasm   . Chronic back pain    spinal stenosis  . Colon cancer ([place])   . Complication of anesthesia    B/P bottoms out, hard to wake up   . Contact dermatitis   . COPD (chronic obstructive pulmonary disease) (HCC)    Albuterol inhaler and DuoNeb as  needed  . Coronary artery disease    mild by 08/25/09 cath  . Foot drop, left   . Headache   . Heart murmur    never had any problems  . History of blood transfusion    no abnormal reaction noted  . History of bronchitis   . History of MRSA infection 6+ yrs ago  . Hypertension    takes Metoprolol daily  . Insomnia    takes Trazodone nightly  . Joint pain   . Joint swelling   . Nocturia   . Panic attacks    takes Effexor daily  . Peripheral edema    takes Lasix daily and takes Bumex every 3 days.Takes Aldactone daily   . Pulmonary emboli ([place]) 2018  . Restless leg syndrome    takes Requip daily  . RLS (restless legs syndrome)   . Sleep apnea    doesnt use CPAP    Patient Active Problem List   Diagnosis Date Noted  . Chronic bilateral low back pain with left-sided sciatica (Primary Area of Pain) 03/12/2018  . Chronic pain of left lower extremity (Secondary Area of Pain) 03/12/2018  . Chronic pain syndrome 03/12/2018  . Long term current use of opiate analgesic 03/12/2018  . Pharmacologic therapy 03/12/2018  . Disorder of skeletal system 03/12/2018  . Problems influencing health status 03/12/2018  . Hyperparathyroidism,  primary ([place]) 01/20/2018  . Medicare annual wellness visit, initial 09/02/2017  . Acute saddle pulmonary embolism with acute cor pulmonale ([place]) 02/20/2017  . Dyspnea 12/29/2016  . Acute congestive heart failure ([place])   . HCAP (healthcare-associated pneumonia)   . Pulmonary embolus ([place])   . Lumbar adjacent segment disease with spondylolisthesis 12/25/2016  . Aortic atherosclerosis ([place]) 08/14/2016  . Lymphedema of left leg 08/05/2016  . B12 deficiency 05/15/2016  . Chronic contact dermatitis 05/01/2016  . History of colon cancer 05/01/2016  . Mild chronic obstructive pulmonary disease ([place]) 05/01/2016  . COPD with acute exacerbation ([place]) 11/27/2015  . COPD exacerbation ([place]) 08/21/2015  . Spinal stenosis of lumbar region 04/02/2015  . Restless  legs 02/27/2015  . Hypertension   . Tobacco use disorder 09/12/2014  . Anemia 06/17/2014  . Sedated due to medication 06/17/2014  . Obstructive sleep apnea 06/16/2014  . Benign essential hypertension 06/16/2014  . Restless legs syndrome 06/16/2014  . Colon cancer ([place])   . Depression   . History of total hip replacement, left 10/25/2013  . S/P total hip arthroplasty 10/25/2013    Past Surgical History:  Procedure Laterality Date  . ABDOMINAL HYSTERECTOMY    . BACK SURGERY    . CHOLECYSTECTOMY    . COLON SURGERY     partial colectomy d/t cancer cells  . COLONOSCOPY    . I&D of left hip      x 4  . JOINT REPLACEMENT    . Resection total hip    . Revision of total hip arthroplasty     x3-4  . right hip replacement    . TOTAL HIP ARTHROPLASTY Left      OB History   None      Home Medications    Prior to Admission medications   Medication Sig Start Date End Date Taking? Authorizing Provider  albuterol (PROVENTIL HFA;VENTOLIN HFA) 108 (90 Base) MCG/ACT inhaler Inhale 2 puffs into the lungs every 4 (four) hours as needed for wheezing or shortness of breath. 12/05/15  Yes Wieting, Delfino Lovett, MD  aspirin EC 81 MG tablet Take 81 mg by mouth daily.   Yes [provider]  budesonide-formoterol (SYMBICORT) 160-4.5 MCG/ACT inhaler Inhale 2 puffs into the lungs 2 (two) times daily. Patient taking differently: Inhale 2 puffs into the lungs 2 (two) times daily as needed (for respiratory issues).  12/05/15  Yes Wieting, Richard, MD  cyclobenzaprine (FLEXERIL) 10 MG tablet Take 1 tablet (10 mg total) by mouth 3 (three) times daily as needed for muscle spasms. 04/04/18  Yes Newman Pies, MD  docusate sodium (COLACE) 100 MG capsule Take 1 capsule (100 mg total) by mouth 2 (two) times daily. 04/04/18  Yes Newman Pies, MD  fluticasone [facility]) 50 MCG/ACT nasal spray Place 1 spray into both nostrils daily as needed (for sinus issues).    Yes [provider]  furosemide  (LASIX) 20 MG tablet Take 1 tablet (20 mg total) by mouth daily. Patient taking differently: Take 20 mg by mouth 2 (two) times daily.  04/29/16 02/28/26 Yes Lavonia Drafts, MD  gabapentin (NEURONTIN) 100 MG capsule Take 200 mg by mouth at bedtime. 02/15/18  Yes [provider]  ipratropium-albuterol (DUONEB) 0.5-2.5 (3) MG/3ML SOLN Take 3 mLs by nebulization every 6 (six) hours as needed (for wheezing/shortness of breath).   Yes [provider]  methocarbamol (ROBAXIN) 500 MG tablet Take 1-2 tablets (500-1,000 mg total) by mouth 4 (four) times daily as needed for muscle spasms. Patient taking  differently: Take 500 mg by mouth 4 (four) times daily as needed for muscle spasms.  5/72/62  Yes Delora Fuel, MD  metoprolol succinate (TOPROL-XL) 50 MG 24 hr tablet Take 1 tablet (50 mg total) by mouth every evening. Take with or immediately following a meal. Patient taking differently: Take 50 mg by mouth at bedtime. Take with or immediately following a meal. 12/26/15  Yes Crissman, Jeannette How, MD  oxyCODONE 10 MG TABS Take 1 tablet (10 mg total) by mouth every 4 (four) hours as needed for severe pain ((score 7 to 10)). 04/04/18  Yes Newman Pies, MD  PARoxetine (PAXIL) 40 MG tablet Take 40 mg by mouth at bedtime.    Yes [provider]  rOPINIRole (REQUIP) 3 MG tablet Take 1 tablet (3 mg total) by mouth 4 (four) times daily. 04/20/15  Yes Crissman, Jeannette How, MD  spironolactone (ALDACTONE) 25 MG tablet Take 25 mg by mouth every evening.   Yes [provider]    Family History Family History  Problem Relation Age of Onset  . Cancer Mother   . Stroke Father   . Diabetes Sister   . Mental illness Sister     Social History Social History   Tobacco Use  . Smoking status: Current Every Day Smoker    Packs/day: 0.75    Years: 51.00    Pack years: 38.25    Types: Cigarettes    Start date: 07/08/1964  . Smokeless tobacco: Never Used  Substance Use Topics  . Alcohol use:  No  . Drug use: No     Allergies   Hydrocodone-chlorpheniramine; Sulfa antibiotics; Chlorhexidine; Methadone; Benadryl [diphenhydramine]; Latex; and Tape   Review of Systems Review of Systems See HPI  Physical Exam Updated Vital Signs BP (!) 139/95   Pulse 73   Temp 98.7 F (37.1 C) (Rectal)   Resp 17   Ht 5' (1.524 m)   Wt 78.9 kg   SpO2 91%   BMI 33.98 kg/m   Physical Exam Vitals:   04/08/18 0900 04/08/18 0930 04/08/18 1000 04/08/18 1215  BP:  113/62 110/65 (!) 139/95  Pulse:  85 87 73  Resp:  15 16 17   Temp: 98.7 F (37.1 C)     TempSrc: Rectal     SpO2:  92% 91% 91%  Weight:      Height:       General: Vital signs reviewed.  Patient is well-nourished, NAD, somnolent but cooperative, resting in bed comfortably. Falls asleep several times during my encounter.  Head: Normocephalic and atraumatic. Eyes: EOMI, conjunctivae normal, no scleral icterus. PERRL Cardiovascular: RRR, S1 normal, S2 normal, no murmurs, gallops, or rubs. Pulmonary/Chest: Slight bibasilar crackles, superior lung fields are clear to auscultation bilaterally, no wheezes Abdominal: Soft, unable to reproduce reported suprapubic tenderness on my exam, non-distended, BS +, no masses, organomegaly, or guarding present.  Musculoskeletal: Well healing incision on lumbar spine, uncovered, no drainage or skin dehiscence. Well healed scars from bilateral hip replacements. No tenderness to palpation of bilateral greater trochanters. No pain with leg raise. No pain with hip flexion or internal and external rotation. Left knee tender to palpation over medial and lateral joint space, negative patellar grind, no tenderness over patellar tendon.  Extremities: Trace lower extremity edema bilaterally,  pulses symmetric and intact bilaterally.  Neurological: A&O x3, Strength is normal and symmetric bilaterally, cranial nerve II-XII are grossly intact, no focal motor deficit, sensory intact to light touch bilaterally.    Skin: Warm, dry and  intact. Bruise on left lateral thigh. No abrasions.  Psychiatric: Somnolent but easily arousable. Cognition seems slightly impaired, memory appears in tact, just slightly delayed recall    ED Treatments / Results  Labs (all labs ordered are listed, but only abnormal results are displayed) Labs Reviewed  CBC WITH DIFFERENTIAL/PLATELET - Abnormal; Notable for the following components:      Result Value   RBC 3.55 (*)    Hemoglobin 11.0 (*)    HCT 34.4 (*)    All other components within normal limits  COMPREHENSIVE METABOLIC PANEL - Abnormal; Notable for the following components:   Total Protein 6.4 (*)    Albumin 2.9 (*)    All other components within normal limits  URINE CULTURE  URINALYSIS, ROUTINE W REFLEX MICROSCOPIC  CK  I-STAT TROPONIN, ED    EKG EKG Interpretation  Date/Time:  Wednesday April 08 2018 09:22:29 EDT Ventricular Rate:  82 PR Interval:    QRS Duration: 78 QT Interval:  372 QTC Calculation: 435 R Axis:   39 Text Interpretation:  Sinus rhythm Low voltage, precordial leads No significant change since last tracing Confirmed by Wandra Arthurs [phone]) on 04/08/2018 9:45:33 AM   Radiology Dg Chest 1 View  Result Date: 04/08/2018 CLINICAL DATA:  Pain after fall today. COPD. EXAM: CHEST  1 VIEW COMPARISON:  Chest radiograph 12/28/2016 and CTA 02/01/2017 FINDINGS: The cardiomediastinal silhouette is within normal limits. The lungs are hyperinflated with chronic peribronchial thickening. Lung aeration is improved from the prior radiograph with decreased interstitial prominence and pulmonary vascular congestion. No acute airspace consolidation, overt edema, sizable pleural effusion, or pneumothorax is currently evident. Aortic atherosclerosis is noted. No acute osseous abnormality is identified. IMPRESSION: COPD without evidence of acute cardiopulmonary process. Electronically Signed   By: Logan Bores M.D.   On: 04/08/2018 10:49   Dg Lumbar Spine  Complete  Result Date: 04/08/2018 CLINICAL DATA:  Fall today.  Back pain.  Recent lumbar fusion. EXAM: LUMBAR SPINE - COMPLETE 4+ VIEW COMPARISON:  Lumbar MRI 01/22/2018, lumbar radiographs 01/01/2018 FINDINGS: Pedicle screw and interbody fusion L3-4 L4-5. Hardware in good position. Interbody fusion also at L5-S1. Normal alignment.  No fracture. Bilateral hip replacement. IMPRESSION: Negative for lumbar fracture. Electronically Signed   By: Franchot Gallo M.D.   On: 04/08/2018 10:56   Dg Knee Complete 4 Views Left  Result Date: 04/08/2018 CLINICAL DATA:  Fall.  Knee pain EXAM: LEFT KNEE - COMPLETE 4+ VIEW COMPARISON:  04/22/2016 FINDINGS: Mild chondrocalcinosis bilaterally. Negative for fracture or effusion. No significant joint space narrowing. IMPRESSION: Negative for fracture.  Mild chondrocalcinosis. Electronically Signed   By: Franchot Gallo M.D.   On: 04/08/2018 10:57   Dg Hip Unilat With Pelvis 2-3 Views Left  Result Date: 04/08/2018 CLINICAL DATA:  Fall today.  Left lower extremity pain. EXAM: DG HIP (WITH OR WITHOUT PELVIS) 2-3V LEFT COMPARISON:  Left hip radiographs 04/22/2016. FINDINGS: Left hip prosthesis is again noted. The hip is located. Previous broken cerclage wires are stable. No new fractures are present. The prosthesis is intact. Right total hip arthroplasty is incidentally noted. Lower lumbar spine hardware is again seen. IMPRESSION: 1. No acute abnormality. 2. Left total hip arthroplasty without radiographic evidence for complication. The joint is located. No acute fracture is present. Electronically Signed   By: San Morelle M.D.   On: 04/08/2018 11:07    Procedures Procedures (including critical care time)  Medications Ordered in ED Medications  fentaNYL (SUBLIMAZE) injection 50 mcg (50 mcg Intravenous  Given 04/08/18 1246)     Initial Impression / Assessment and Plan / ED Course  I have reviewed the triage vital signs and the nursing notes.  Pertinent labs &  imaging results that were available during my care of the patient were reviewed by me and considered in my medical decision making (see chart for details).  72yo female with lumbar spinal stenosis s/p surgery 6 days ago, copd, CAD, HTN, panic attacks who presents after a fall at home. On chronic opiates. Somnolent on exam but easily arousable and oriented to person, place, time, situation. Presents after a presumed mechanical fall at home but reports one day history of generalized weakness, low grade temperature at home, cough, subjective suprapubic pain. Denies head trauma but unable to confidently tell me that she did not lose consciousness. Vital signs are stable. She is no acute distress. Differential at this point includes underlying infection, opiate overdose, alcohol withdrawal, syncope. Will order x-rays and basic lab work.  Clinical Course as of Apr 08 1337  Wed Apr 08, 2018  1039 Initial labs reassuring. Troponin negative and EKG without ischemic changes or abnormal intervals. No leukocytosis or electrolyte abnormalities. Awaiting x-rays   [MV]    Clinical Course User Index [MV] Isabelle Course, MD   X-rays of hip, left knee, lumbar spine negative for acute trauma. So signs of infection on blood work or urine. Pain is better controlled after fentanyl and not as somnolent. Patient is safe for discharge. Encouraged her to follow up with PCP about home health PT.    Final Clinical Impressions(s) / ED Diagnoses   Final diagnoses:  Fall, initial encounter    ED Discharge Orders    None       Isabelle Course, MD 04/08/18 1339    Drenda Freeze, MD 04/10/18 2143

## 2018-04-08 NOTE — ED Notes (Addendum)
Pt ambulated to bedside commode with assistance. Pt complaining of back pain and left leg pain.  Pt states she ambulates with assistive device (walker) @ home post surgery. Pt ambulated safely back to beside.

## 2018-04-08 NOTE — ED Notes (Signed)
Patient given discharge instructions and verbalized understanding.  Patient stable to discharge at this time.  Patient is alert and oriented to baseline.  No distressed noted at this time.  All belongings taken with the patient at discharge.   

## 2018-04-08 NOTE — Discharge Instructions (Addendum)
Please follow up with your primary doctor and your surgeon. We did not find any evidence of broken bones from the fall and no signs of infection in your blood or urine. Please try to get physical therapy set up at home as this will be important to prevent future falls.

## 2018-04-08 NOTE — ED Notes (Signed)
Wasted 70mcg of Fentanyl in the sharps box with Counsellor

## 2018-04-08 NOTE — Discharge Planning (Signed)
Pt currently active with [facility]  for PT services.  Resumption of care requested. Tiffany of [facility]  notified.  No DME needs identified at this time.

## 2018-04-08 NOTE — ED Triage Notes (Signed)
Per EMS pt was at home this morning and around 0630 she was walking in the dark to the bathroom and hit her table with her left hip and fell to the ground.  She had back surgery here 1 week ago and needed help to stand back up.  At baseline she walks without a walker but since the surgery she has been using her walker.  No visible trauma noted.  Complains of pain 6/10 AOx4 NAD noted at this time.

## 2018-04-09 ENCOUNTER — Emergency Department (HOSPITAL_COMMUNITY): Payer: Medicare HMO

## 2018-04-09 ENCOUNTER — Other Ambulatory Visit: Payer: Self-pay

## 2018-04-09 ENCOUNTER — Encounter (HOSPITAL_COMMUNITY): Payer: Self-pay | Admitting: Emergency Medicine

## 2018-04-09 ENCOUNTER — Emergency Department (HOSPITAL_COMMUNITY)
Admission: EM | Admit: 2018-04-09 | Discharge: 2018-04-09 | Disposition: A | Discharge status: Home / Self Care | Payer: Medicare HMO | Attending: Emergency Medicine | Admitting: Emergency Medicine

## 2018-04-09 DIAGNOSIS — W010XXA Fall on same level from slipping, tripping and stumbling without subsequent striking against object, initial encounter: Secondary | ICD-10-CM | POA: Insufficient documentation

## 2018-04-09 DIAGNOSIS — Y939 Activity, unspecified: Secondary | ICD-10-CM | POA: Diagnosis not present

## 2018-04-09 DIAGNOSIS — W19XXXA Unspecified fall, initial encounter: Secondary | ICD-10-CM

## 2018-04-09 DIAGNOSIS — J449 Chronic obstructive pulmonary disease, unspecified: Secondary | ICD-10-CM | POA: Insufficient documentation

## 2018-04-09 DIAGNOSIS — F17210 Nicotine dependence, cigarettes, uncomplicated: Secondary | ICD-10-CM | POA: Diagnosis not present

## 2018-04-09 DIAGNOSIS — Z7982 Long term (current) use of aspirin: Secondary | ICD-10-CM | POA: Insufficient documentation

## 2018-04-09 DIAGNOSIS — R030 Elevated blood-pressure reading, without diagnosis of hypertension: Secondary | ICD-10-CM

## 2018-04-09 DIAGNOSIS — S0990XA Unspecified injury of head, initial encounter: Secondary | ICD-10-CM

## 2018-04-09 DIAGNOSIS — Y92009 Unspecified place in unspecified non-institutional (private) residence as the place of occurrence of the external cause: Secondary | ICD-10-CM | POA: Insufficient documentation

## 2018-04-09 DIAGNOSIS — Z79899 Other long term (current) drug therapy: Secondary | ICD-10-CM | POA: Diagnosis not present

## 2018-04-09 DIAGNOSIS — Z86718 Personal history of other venous thrombosis and embolism: Secondary | ICD-10-CM | POA: Diagnosis not present

## 2018-04-09 DIAGNOSIS — I10 Essential (primary) hypertension: Secondary | ICD-10-CM | POA: Insufficient documentation

## 2018-04-09 DIAGNOSIS — Z91040 Latex allergy status: Secondary | ICD-10-CM | POA: Diagnosis not present

## 2018-04-09 DIAGNOSIS — Y998 Other external cause status: Secondary | ICD-10-CM | POA: Diagnosis not present

## 2018-04-09 LAB — URINE CULTURE: Culture: <10000 — AB

## 2018-04-09 MED ORDER — OXYCODONE HCL 5 MG PO TABS
5.0000 mg | ORAL_TABLET | Freq: Once | ORAL | Status: AC
  Administered 2018-04-09: 5 mg via ORAL
  Filled 2018-04-09: qty 1

## 2018-04-09 MED ORDER — MORPHINE SULFATE (PF) 4 MG/ML IV SOLN
4.0000 mg | Freq: Once | INTRAVENOUS | Status: AC
  Administered 2018-04-09: 4 mg via INTRAMUSCULAR
  Filled 2018-04-09: qty 1

## 2018-04-09 NOTE — ED Notes (Signed)
Patient transported to CT 

## 2018-04-09 NOTE — ED Provider Notes (Signed)
[facility] EMERGENCY DEPARTMENT Provider Note   CSN: [id number] Arrival date & time: 04/09/18  1844     History   Chief Complaint Chief Complaint  Patient presents with  . Fall    HPI         presenting with her husband JULANN MCGILVRAY is a 72 y.o. female 1 week post posterior lumbar interbody fusion and prosthesis surgery presenting for her second fall in 2 days.  Patient states that she was standing in her house with when her dogs were fighting around her feet and she lost her balance falling striking her left hip and left side of her head on the floor.  Patient denies loss of consciousness.  At time of evaluation patient endorsing left hip pain that she describes as a constant throbbing pain 10/10 in severity worsened with movement. Patient also endorsing some left-sided head pain that is throbbing constant and minimal in severity.  Patient denies loss of consciousness, blood thinner use, vision changes, confusion, numbness/tingling or weakness or speech difficulties.  HPI  Past Medical History:  Diagnosis Date  . Anemia    takes ferrous sulfate daily  . Anxiety   . Arthritis   . Bladder spasm   . Chronic back pain    spinal stenosis  . Colon cancer ([place])   . Complication of anesthesia    B/P bottoms out, hard to wake up   . Contact dermatitis   . COPD (chronic obstructive pulmonary disease) (HCC)    Albuterol inhaler and DuoNeb as needed  . Coronary artery disease    mild by 08/25/09 cath  . Foot drop, left   . Headache   . Heart murmur    never had any problems  . History of blood transfusion    no abnormal reaction noted  . History of bronchitis   . History of MRSA infection 6+ yrs ago  . Hypertension    takes Metoprolol daily  . Insomnia    takes Trazodone nightly  . Joint pain   . Joint swelling   . Nocturia   . Panic attacks    takes Effexor daily  . Peripheral edema    takes Lasix daily and takes Bumex every 3 days.Takes Aldactone  daily   . Pulmonary emboli ([place]) 2018  . Restless leg syndrome    takes Requip daily  . RLS (restless legs syndrome)   . Sleep apnea    doesnt use CPAP    Patient Active Problem List   Diagnosis Date Noted  . Chronic bilateral low back pain with left-sided sciatica (Primary Area of Pain) 03/12/2018  . Chronic pain of left lower extremity (Secondary Area of Pain) 03/12/2018  . Chronic pain syndrome 03/12/2018  . Long term current use of opiate analgesic 03/12/2018  . Pharmacologic therapy 03/12/2018  . Disorder of skeletal system 03/12/2018  . Problems influencing health status 03/12/2018  . Hyperparathyroidism, primary ([place]) 01/20/2018  . Medicare annual wellness visit, initial 09/02/2017  . Acute saddle pulmonary embolism with acute cor pulmonale ([place]) 02/20/2017  . Dyspnea 12/29/2016  . Acute congestive heart failure ([place])   . HCAP (healthcare-associated pneumonia)   . Pulmonary embolus ([place])   . Lumbar adjacent segment disease with spondylolisthesis 12/25/2016  . Aortic atherosclerosis ([place]) 08/14/2016  . Lymphedema of left leg 08/05/2016  . B12 deficiency 05/15/2016  . Chronic contact dermatitis 05/01/2016  . History of colon cancer 05/01/2016  . Mild chronic obstructive pulmonary disease ([place]) 05/01/2016  . COPD  with acute exacerbation ([place]) 11/27/2015  . COPD exacerbation ([place]) 08/21/2015  . Spinal stenosis of lumbar region 04/02/2015  . Restless legs 02/27/2015  . Hypertension   . Tobacco use disorder 09/12/2014  . Anemia 06/17/2014  . Sedated due to medication 06/17/2014  . Obstructive sleep apnea 06/16/2014  . Benign essential hypertension 06/16/2014  . Restless legs syndrome 06/16/2014  . Colon cancer ([place])   . Depression   . History of total hip replacement, left 10/25/2013  . S/P total hip arthroplasty 10/25/2013    Past Surgical History:  Procedure Laterality Date  . ABDOMINAL HYSTERECTOMY    . BACK SURGERY    . CHOLECYSTECTOMY    . COLON SURGERY       partial colectomy d/t cancer cells  . COLONOSCOPY    . I&D of left hip      x 4  . JOINT REPLACEMENT    . Resection total hip    . Revision of total hip arthroplasty     x3-4  . right hip replacement    . TOTAL HIP ARTHROPLASTY Left      OB History   None      Home Medications    Prior to Admission medications   Medication Sig Start Date End Date Taking? Authorizing Provider  albuterol (PROVENTIL HFA;VENTOLIN HFA) 108 (90 Base) MCG/ACT inhaler Inhale 2 puffs into the lungs every 4 (four) hours as needed for wheezing or shortness of breath. 12/05/15   Loletha Grayer, MD  aspirin EC 81 MG tablet Take 81 mg by mouth daily.    [provider]  budesonide-formoterol (SYMBICORT) 160-4.5 MCG/ACT inhaler Inhale 2 puffs into the lungs 2 (two) times daily. Patient taking differently: Inhale 2 puffs into the lungs 2 (two) times daily as needed (for respiratory issues).  12/05/15   Loletha Grayer, MD  cyclobenzaprine (FLEXERIL) 10 MG tablet Take 1 tablet (10 mg total) by mouth 3 (three) times daily as needed for muscle spasms. 04/04/18   Newman Pies, MD  docusate sodium (COLACE) 100 MG capsule Take 1 capsule (100 mg total) by mouth 2 (two) times daily. 04/04/18   Newman Pies, MD  fluticasone [facility]) 50 MCG/ACT nasal spray Place 1 spray into both nostrils daily as needed (for sinus issues).     [provider]  furosemide (LASIX) 20 MG tablet Take 1 tablet (20 mg total) by mouth daily. Patient taking differently: Take 20 mg by mouth 2 (two) times daily.  04/29/16 02/28/26  Lavonia Drafts, MD  gabapentin (NEURONTIN) 100 MG capsule Take 200 mg by mouth at bedtime. 02/15/18   [provider]  ipratropium-albuterol (DUONEB) 0.5-2.5 (3) MG/3ML SOLN Take 3 mLs by nebulization every 6 (six) hours as needed (for wheezing/shortness of breath).    [provider]  methocarbamol (ROBAXIN) 500 MG tablet Take 1-2 tablets (500-1,000 mg total) by mouth 4 (four)  times daily as needed for muscle spasms. Patient taking differently: Take 500 mg by mouth 4 (four) times daily as needed for muscle spasms.  5/42/70   Delora Fuel, MD  metoprolol succinate (TOPROL-XL) 50 MG 24 hr tablet Take 1 tablet (50 mg total) by mouth every evening. Take with or immediately following a meal. Patient taking differently: Take 50 mg by mouth at bedtime. Take with or immediately following a meal. 12/26/15   Crissman, Jeannette How, MD  oxyCODONE 10 MG TABS Take 1 tablet (10 mg total) by mouth every 4 (four) hours as needed for severe pain ((score 7 to 10)).  04/04/18   Newman Pies, MD  PARoxetine (PAXIL) 40 MG tablet Take 40 mg by mouth at bedtime.     [provider]  rOPINIRole (REQUIP) 3 MG tablet Take 1 tablet (3 mg total) by mouth 4 (four) times daily. 04/20/15   [name], MD  spironolactone (ALDACTONE) 25 MG tablet Take 25 mg by mouth every evening.    [provider]     Family History Family History  Problem Relation Age of Onset  . Cancer Mother   . Stroke Father   . Diabetes Sister   . Mental illness Sister     Social History Social History   Tobacco Use  . Smoking status: Current Every Day Smoker    Packs/day: 0.75    Years: 51.00    Pack years: 38.25    Types: Cigarettes    Start date: 07/08/1964  . Smokeless tobacco: Never Used  Substance Use Topics  . Alcohol use: No  . Drug use: No     Allergies   Hydrocodone-chlorpheniramine; Sulfa antibiotics; Chlorhexidine; Methadone; Benadryl [diphenhydramine]; Latex; and Tape   Review of Systems Review of Systems  Constitutional: Negative.  Negative for chills and fever.  HENT: Negative.  Negative for rhinorrhea and sore throat.   Eyes: Negative.  Negative for visual disturbance.  Respiratory: Negative.  Negative for cough and shortness of breath.   Cardiovascular: Negative.  Negative for chest pain.  Gastrointestinal: Negative.  Negative for abdominal pain, blood in stool,  diarrhea, nausea and vomiting.  Genitourinary: Negative.  Negative for dysuria and hematuria.  Musculoskeletal: Positive for arthralgias and back pain. Negative for myalgias and neck pain.  Skin: Negative.  Negative for wound.  Neurological: Positive for headaches. Negative for dizziness, syncope and weakness.    Physical Exam Updated Vital Signs BP 138/65 (BP Location: Right Arm)   Pulse 91   Temp 97.7 F (36.5 C) (Oral)   Resp 16   SpO2 95%   Physical Exam  Constitutional: She is oriented to person, place, and time. She appears well-developed and well-nourished. No distress.  HENT:  Head: Normocephalic and atraumatic.  Right Ear: External ear normal.  Left Ear: External ear normal.  Nose: Nose normal.  Eyes: Pupils are equal, round, and reactive to light. EOM are normal.  Neck: Trachea normal and normal range of motion. Neck supple. No tracheal deviation present.  Cardiovascular: Normal rate, regular rhythm, normal heart sounds and intact distal pulses.  Pulses:      Dorsalis pedis pulses are 2+ on the right side, and 2+ on the left side.       Posterior tibial pulses are 2+ on the right side, and 2+ on the left side.  Pulmonary/Chest: Effort normal and breath sounds normal. No respiratory distress.  Abdominal: Soft. There is no tenderness. There is no rebound and no guarding.  Musculoskeletal: Normal range of motion. She exhibits no deformity.       Right hip: Normal.       Left hip: She exhibits tenderness.       Right knee: Normal.       Left knee: Normal.       Right ankle: Normal.       Left ankle: Normal.       Cervical back: Normal. She exhibits normal range of motion, no tenderness and no deformity.       Thoracic back: Normal. She exhibits no tenderness, no bony tenderness and no deformity.       Lumbar  back: She exhibits tenderness.       Legs: Patient with healing surgical scar to lumbar spine.  No streaking, increased warmth or drainage from the  area.  Patient with midline lumbar tenderness to palpation.  No obvious crepitus step-off or deformity aside from surgical scar noted.  No thoracic or cervical spine midline tenderness to palpation, no crepitus step-off or deformity noted.  Feet:  Right Foot:  Protective Sensation: 3 sites tested. 3 sites sensed.  Left Foot:  Protective Sensation: 3 sites tested. 3 sites sensed.  Neurological: She is alert and oriented to person, place, and time. She has normal strength. No cranial nerve deficit or sensory deficit. GCS eye subscore is 4. GCS verbal subscore is 5. GCS motor subscore is 6.  Mental Status: Alert, oriented, thought content appropriate, able to give a coherent history. Speech fluent without evidence of aphasia. Able to follow 2 step commands without difficulty. Cranial Nerves: II: Peripheral visual fields grossly normal, pupils equal, round, reactive to light III,IV, VI: ptosis not present, extra-ocular motions intact bilaterally V,VII: smile symmetric, eyebrows raise symmetric, facial light touch sensation equal VIII: hearing grossly normal to voice X: uvula elevates symmetrically XI: bilateral shoulder shrug symmetric and strong XII: midline tongue extension without fassiculations Motor: Normal tone. 5/5 strength in upper and lower extremities bilaterally including strong and equal grip strength and dorsiflexion/plantar flexion Sensory: Sensation intact to light touch in all extremities.  Gait: Slow gait with assistance some assistance from husband. CV: distal pulses palpable throughout  Skin: Skin is warm and dry. Capillary refill takes less than 2 seconds.  Psychiatric: She has a normal mood and affect. Her behavior is normal.    ED Treatments / Results  Labs (all labs ordered are listed, but only abnormal results are displayed) Labs Reviewed - No data to display  EKG None  Radiology Dg Chest 1 View  Result Date: 04/08/2018 CLINICAL DATA:  Pain after  fall today. COPD. EXAM: CHEST  1 VIEW COMPARISON:  Chest radiograph 12/28/2016 and CTA 02/01/2017 FINDINGS: The cardiomediastinal silhouette is within normal limits. The lungs are hyperinflated with chronic peribronchial thickening. Lung aeration is improved from the prior radiograph with decreased interstitial prominence and pulmonary vascular congestion. No acute airspace consolidation, overt edema, sizable pleural effusion, or pneumothorax is currently evident. Aortic atherosclerosis is noted. No acute osseous abnormality is identified. IMPRESSION: COPD without evidence of acute cardiopulmonary process. Electronically Signed   By: Logan Bores M.D.   On: 04/08/2018 10:49   Dg Lumbar Spine Complete  Result Date: 04/09/2018 CLINICAL DATA:  Acute low back pain following fall. Recent lumbar surgery. EXAM: LUMBAR SPINE - COMPLETE 4+ VIEW COMPARISON:  04/08/2018 and prior radiograph FINDINGS: There is no evidence of acute fracture or subluxation. Posterior rod, bipedicular screw and interbody prostheses at L3-4 and L4-5 again noted. No complicating hardware features noted. No suspicious focal bony lesions are present. Bilateral hip arthroplasties again identified. Aortic atherosclerotic calcifications are present. IMPRESSION: 1. No evidence of acute abnormality. L3-L4-L5 fusion changes without acute or complicating features. Electronically Signed   By: Margarette Canada M.D.   On: 04/09/2018 20:44   Dg Lumbar Spine Complete  Result Date: 04/08/2018 CLINICAL DATA:  Fall today.  Back pain.  Recent lumbar fusion. EXAM: LUMBAR SPINE - COMPLETE 4+ VIEW COMPARISON:  Lumbar MRI 01/22/2018, lumbar radiographs 01/01/2018 FINDINGS: Pedicle screw and interbody fusion L3-4 L4-5. Hardware in good position. Interbody fusion also at L5-S1. Normal alignment.  No fracture. Bilateral hip replacement. IMPRESSION: Negative for  lumbar fracture. Electronically Signed   By: Franchot Gallo M.D.   On: 04/08/2018 10:56   Ct Head Wo  Contrast  Result Date: 04/09/2018 CLINICAL DATA:  Fall EXAM: CT HEAD WITHOUT CONTRAST CT CERVICAL SPINE WITHOUT CONTRAST TECHNIQUE: Multidetector CT imaging of the head and cervical spine was performed following the standard protocol without intravenous contrast. Multiplanar CT image reconstructions of the cervical spine were also generated. COMPARISON:  CT head dated 12/29/2016 FINDINGS: CT HEAD FINDINGS Brain: No evidence of acute infarction, hemorrhage, hydrocephalus, extra-axial collection or mass lesion/mass effect. Mild cortical atrophy. Subcortical white matter and periventricular small vessel ischemic changes. Vascular: No hyperdense vessel or unexpected calcification. Skull: Normal. Negative for fracture or focal lesion. Sinuses/Orbits: The visualized paranasal sinuses are essentially clear. The mastoid air cells are unopacified. Other: None. CT CERVICAL SPINE FINDINGS Alignment: Reversal of the normal mid cervical lordosis. Skull base and vertebrae: No acute fracture. No primary bone lesion or focal pathologic process. Soft tissues and spinal canal: No prevertebral fluid or swelling. No visible canal hematoma. Disc levels: Mild-to-moderate degenerative changes of the mid cervical spine. Spinal canal is patent. Upper chest: Visualized lung apices are notable for paraseptal emphysematous changes and mild dependent atelectasis. Other: Visualized thyroid is unremarkable. IMPRESSION: No evidence of acute intracranial abnormality. Atrophy with small vessel ischemic changes. No evidence of traumatic injury cervical spine. Mild to moderate degenerative changes. Electronically Signed   By: Julian Hy M.D.   On: 04/09/2018 20:29   Ct Cervical Spine Wo Contrast  Result Date: 04/09/2018 CLINICAL DATA:  Fall EXAM: CT HEAD WITHOUT CONTRAST CT CERVICAL SPINE WITHOUT CONTRAST TECHNIQUE: Multidetector CT imaging of the head and cervical spine was performed following the standard protocol without intravenous  contrast. Multiplanar CT image reconstructions of the cervical spine were also generated. COMPARISON:  CT head dated 12/29/2016 FINDINGS: CT HEAD FINDINGS Brain: No evidence of acute infarction, hemorrhage, hydrocephalus, extra-axial collection or mass lesion/mass effect. Mild cortical atrophy. Subcortical white matter and periventricular small vessel ischemic changes. Vascular: No hyperdense vessel or unexpected calcification. Skull: Normal. Negative for fracture or focal lesion. Sinuses/Orbits: The visualized paranasal sinuses are essentially clear. The mastoid air cells are unopacified. Other: None. CT CERVICAL SPINE FINDINGS Alignment: Reversal of the normal mid cervical lordosis. Skull base and vertebrae: No acute fracture. No primary bone lesion or focal pathologic process. Soft tissues and spinal canal: No prevertebral fluid or swelling. No visible canal hematoma. Disc levels: Mild-to-moderate degenerative changes of the mid cervical spine. Spinal canal is patent. Upper chest: Visualized lung apices are notable for paraseptal emphysematous changes and mild dependent atelectasis. Other: Visualized thyroid is unremarkable. IMPRESSION: No evidence of acute intracranial abnormality. Atrophy with small vessel ischemic changes. No evidence of traumatic injury cervical spine. Mild to moderate degenerative changes. Electronically Signed   By: Julian Hy M.D.   On: 04/09/2018 20:29   Dg Knee Complete 4 Views Left  Result Date: 04/08/2018 CLINICAL DATA:  Fall.  Knee pain EXAM: LEFT KNEE - COMPLETE 4+ VIEW COMPARISON:  04/22/2016 FINDINGS: Mild chondrocalcinosis bilaterally. Negative for fracture or effusion. No significant joint space narrowing. IMPRESSION: Negative for fracture.  Mild chondrocalcinosis. Electronically Signed   By: Franchot Gallo M.D.   On: 04/08/2018 10:57   Dg Hip Unilat With Pelvis 2-3 Views Left  Result Date: 04/09/2018 CLINICAL DATA:  Fall, left hip pain EXAM: DG HIP (WITH OR  WITHOUT PELVIS) 2-3V LEFT COMPARISON:  04/08/2018 FINDINGS: Bones are osteopenic. Postop changes of bilateral hip arthroplasties. Lower lumbar fusion  hardware partially imaged. Bony pelvis appears intact. Stable postoperative changes of the left hip arthroplasty. No gross malalignment or acute osseous finding. Stable hardware. IMPRESSION: Osteopenia and stable postoperative findings. No acute finding by plain radiography Electronically Signed   By: Jerilynn Mages.  Shick M.D.   On: 04/09/2018 19:47   Dg Hip Unilat With Pelvis 2-3 Views Left  Result Date: 04/08/2018 CLINICAL DATA:  Fall today.  Left lower extremity pain. EXAM: DG HIP (WITH OR WITHOUT PELVIS) 2-3V LEFT COMPARISON:  Left hip radiographs 04/22/2016. FINDINGS: Left hip prosthesis is again noted. The hip is located. Previous broken cerclage wires are stable. No new fractures are present. The prosthesis is intact. Right total hip arthroplasty is incidentally noted. Lower lumbar spine hardware is again seen. IMPRESSION: 1. No acute abnormality. 2. Left total hip arthroplasty without radiographic evidence for complication. The joint is located. No acute fracture is present. Electronically Signed   By: San Morelle M.D.   On: 04/08/2018 11:07    Procedures Procedures (including critical care time)  Medications Ordered in ED Medications  oxyCODONE (Oxy IR/ROXICODONE) immediate release tablet 5 mg (5 mg Oral Given 04/09/18 2002)  morphine 4 MG/ML injection 4 mg (4 mg Intramuscular Given 04/09/18 2122)     Initial Impression / Assessment and Plan / ED Course  I have reviewed the triage vital signs and the nursing notes.  Pertinent labs & imaging results that were available during my care of the patient were reviewed by me and considered in my medical decision making (see chart for details).  Clinical Course as of Apr 09 2142  Thu Apr 09, 2018  2113 Dr. Venora Maples has seen and evaluated the patient.   [BM]    Clinical Course User Index [BM]  Deliah Boston, PA-C   72 year old female presenting today for mechanical fall at home.  Patient with left hip pain and left-sided headache after fall.  Recent lumbar surgery.  CT head negative CT cervical spine negative Hip imaging negative for acute findings Lumbar spine imaging negative for acute findings  Patient with no focal neuro deficits on examination.  Ambulatory with minimal assistance in department.  Pain controlled in emergency department.  Patient has home oxycodone prescription and does not need refill today.  Patient afebrile, not tachycardic, not hypotensive, well-appearing and in no acute distress.  Patient and family have been updated on work-up today and are agreeable to discharge and outpatient primary care/orthopedics follow-up.  The patient was noted to have elevated BP in ED today. Hx of HTN. I have spoken with the patient regarding elevated blood pressure readings and the need for improved management. I instructed the patient to followup with their PCP within 1 week for BP check. I also counseled the patient regarding the signs and symptoms which would require an emergent visit to an emergency department for hypertensive urgency and/or hypertensive emergency.  At this time there does not appear to be any evidence of an acute emergency medical condition and the patient appears stable for discharge with appropriate outpatient follow up. Diagnosis was discussed with patient who verbalizes understanding of care plan and is agreeable to discharge. I have discussed return precautions with patient and husband at bedside who verbalize understanding of return precautions. Patient strongly encouraged to follow-up with their PCP. All questions answered.  Patient's case discussed with and patient seen and evaluated by Dr. Venora Maples who agrees with discharge and outpatient follow-up at this time.   Note: Portions of this report may have been transcribed using  voice recognition  software. Every effort was made to ensure accuracy; however, inadvertent computerized transcription errors may still be present.  Final Clinical Impressions(s) / ED Diagnoses   Final diagnoses:  Fall, initial encounter  Injury of head, initial encounter  Elevated blood pressure reading    ED Discharge Orders    None       Gari Crown 04/09/18 2146    Jola Schmidt, MD 04/09/18 2350

## 2018-04-09 NOTE — ED Triage Notes (Addendum)
Pt arrives to ED from home with complaints of a mechanical fall. Pt also had a mechanical fall due to dogs yesterday and was seen here. EMS reports pts dogs were fighting and pt tripped over them. Pt states she fell on her left hip and has left hip pain. Pt also states she hit her head on the floor. Pt not on thinners. Pt placed in position of comfort with bed locked and lowered.

## 2018-04-09 NOTE — Discharge Instructions (Addendum)
Please return to the Emergency Department for any new or worsening symptoms or if your symptoms do not improve. Please be sure to follow up with your Primary Care Physician as soon as possible regarding your visit today. If you do not have a Primary Doctor please use the resources below to establish one. Your blood pressure was elevated today, please be sure to take your blood pressure medicine as directed by your primary care doctor and follow-up with them for medication management.  Get help right away if: You have: A very bad (severe) headache that is not helped by medicine. Trouble walking or weakness in your arms and legs. Clear or bloody fluid coming from your nose or ears. Changes in your seeing (vision). Jerky movements that you cannot control (seizure). You throw up (vomit). Your symptoms get worse. You lose balance. Your speech is slurred. You pass out. You are sleepier and have trouble staying awake. The black centers of your eyes (pupils) change in size.  RESOURCE GUIDE  Chronic Pain Problems: Contact [facility]  [phone] Patients need to be referred by their primary care doctor.  Insufficient Money for Medicine: Contact [employer] Way:  call "211" or [place] [phone].  No Primary Care Doctor: Call [facility]  [phone] - can help you locate a primary care doctor that  accepts your insurance, provides certain services, etc. Physician Referral Service- [phone]  Agencies that provide inexpensive medical care: [facility]  [facility]  [phone] [facility]  [phone] [facility]  [phone] [facility]  [phone] [facility]  [phone]  [place] Providers: [facility]- [address], [address]  [phone], Mon-Fri 9am-7pm, Sat 9am-1pm [place], Suite  [place], [address] [place]  [place]- [address]  [place], [address], [phone]  Only accepts [place] patients after they have their name  applied to their card  Self Pay (no insurance) in [facility]: Sickle Cell Patients: Dr Kevan Ny, [facility]  [place], [facility]- [place]  [facility] [place]- [address], [facility]- see information above (Speak to [employer] if you do not have insurance)       -  [facility]- [place], [place],  [place]       -  [place], [place]  Dr Vista Lawman-  [address], [place], [facility]- [address] [phone]       -  [facility] [place]- [address], [place], also [address] [phone]       -    [facility]- [address], [place], 1st & 3rd Saturday   every month, 10am-1pm  1) Find a Doctor and Pay Out of Pocket Although you won't have to find out who is covered  by your insurance plan, it is a good idea to ask around and get recommendations. You will then need to call the office and see if the doctor you have chosen will accept you as a new patient and what types of options they offer for patients who are self-pay. Some doctors offer discounts or will set up payment plans for their patients who do not have insurance, but you will need to ask so you aren't surprised when you get to your appointment.  2) Contact Your Local Health Department Not all health departments have doctors that can see patients for sick visits, but many do, so it is worth a call to see if yours does. If you  don't know where your local health department is, you can check in your phone book. The CDC also has a tool to help you locate your state's health department, and many state websites also have listings of all of their local health departments.  3) Find a [facility] If your illness is not likely to be very severe or complicated, you may want to try a walk in clinic. These are popping up all over the country in pharmacies, drugstores, and shopping centers. They're usually staffed by nurse practitioners or physician assistants that have been trained to treat common illnesses and complaints. They're usually fairly quick and inexpensive. However, if you have serious medical issues or chronic medical problems, these are probably not your best option  [facility] [place], [facility], [address], [place], phone [phone] or [phone].  Monday - Friday, call for an appointment. [place], [facility], [address], [place], phone [phone] or [phone].  Monday - Friday, call for an appointment.  Abuse/Neglect: [place] [phone] [place] [phone] (After Hours)  Emergency Shelter:  [facility] [phone]  Maternity Homes: Room at the [place] [phone] [employer] [phone]  MRSA Hotline #:   (229)818-7686  [facility] Dept. [address]         [place] Phone:  [phone]                                  Phone:  [phone]                   Phone:  [place], [place] in [place], [address],  [phone], [place] [phone] or [phone] (After Hours)   [place]  Substance Abuse Resources: Alcohol and Drug Services  [place] [phone] [facility] [place]a [place] [phone] Residential & Outpatient Substance Abuse Program  [phone]  Psychological Services: [place]  [phone] [place]  [place], [place] [address] [place], [place]: [phone] or [phone], [email or web address]  Dental Assistance  If unable to pay or uninsured, contact:  [facility] or [facility]. to become qualified for the adult dental clinic.  Patients with Medicaid: [facility] [phone] [address], [place] [address], [phone]  If unable to pay, or uninsured, contact [facility] [phone]) or [employer] [phone] in [place], [place] in [facility]) to become qualified for the adult dental clinic   Other [place]- [place], [place], [place], [address], [place], [place], 2nd and 4th Thursday of the month at 6:30am.  10 clients each day by appointment, can sometimes see walk-in patients if someone does not show for an appointment. [facility]- [address] [place]ard Danker [place], [place], [address], [place], [place], [place], [address], [place] Department- [phone] [place] [facility] [phone]

## 2018-04-09 NOTE — ED Notes (Signed)
Patient transported to X-ray 

## 2018-04-09 NOTE — ED Notes (Signed)
Patient verbalizes understanding of discharge instructions. Opportunity for questioning and answers were provided. Armband removed by staff, pt discharged from ED in wheelchair.  

## 2018-04-09 NOTE — ED Notes (Signed)
Pt ambulated in hall. Pt stated she was in some pain but stated it "wasn't that bad" ambulating. Pt had assist with husband. Pt steady with assist.

## 2018-04-13 ENCOUNTER — Emergency Department
Admission: EM | Admit: 2018-04-13 | Discharge: 2018-04-13 | Disposition: A | Discharge status: Home / Self Care | Payer: Medicare HMO | Attending: Emergency Medicine | Admitting: Emergency Medicine

## 2018-04-13 ENCOUNTER — Other Ambulatory Visit: Payer: Self-pay

## 2018-04-13 DIAGNOSIS — Z7982 Long term (current) use of aspirin: Secondary | ICD-10-CM | POA: Diagnosis not present

## 2018-04-13 DIAGNOSIS — Z79899 Other long term (current) drug therapy: Secondary | ICD-10-CM | POA: Diagnosis not present

## 2018-04-13 DIAGNOSIS — I110 Hypertensive heart disease with heart failure: Secondary | ICD-10-CM | POA: Insufficient documentation

## 2018-04-13 DIAGNOSIS — I259 Chronic ischemic heart disease, unspecified: Secondary | ICD-10-CM | POA: Diagnosis not present

## 2018-04-13 DIAGNOSIS — J449 Chronic obstructive pulmonary disease, unspecified: Secondary | ICD-10-CM | POA: Diagnosis not present

## 2018-04-13 DIAGNOSIS — F17210 Nicotine dependence, cigarettes, uncomplicated: Secondary | ICD-10-CM | POA: Insufficient documentation

## 2018-04-13 DIAGNOSIS — R4182 Altered mental status, unspecified: Secondary | ICD-10-CM | POA: Insufficient documentation

## 2018-04-13 DIAGNOSIS — Z0000 Encounter for general adult medical examination without abnormal findings: Secondary | ICD-10-CM

## 2018-04-13 DIAGNOSIS — I509 Heart failure, unspecified: Secondary | ICD-10-CM | POA: Insufficient documentation

## 2018-04-13 NOTE — Discharge Instructions (Addendum)
Return to the emergency department immediately for any confusion or altered mental status, weakness, numbness, fever, or any other symptoms concerning to you.

## 2018-04-13 NOTE — ED Notes (Signed)
MD Lord at bedside speaking with family and patient

## 2018-04-13 NOTE — ED Provider Notes (Signed)
[facility] Emergency Department Provider Note ____________________________________________   I have reviewed the triage vital signs and the triage nursing note.  HISTORY  Chief Complaint Altered Mental Status   Historian Patient and husband  HPI Ann Silva is a 72 y.o. female presenting for evaluation of confusion or altered mental status.  It sound like she was at physical therapy and patient states that she was bored and tired and fell asleep while she was doing intake questioning.  When she woke back up after couple minutes, she states that she became annoyed with the physical therapist but agreed to come in for evaluation.  Denies abdominal pain, chest pain, fevers, weakness, numbness, or confusion altered mental status.  She and her husband state that she stays up all night and usually sleeps during the day so it is quite common for her to fall asleep during the day especially if she is bored.  She is acting normally now.     Past Medical History:  Diagnosis Date  . Anemia    takes ferrous sulfate daily  . Anxiety   . Arthritis   . Bladder spasm   . Chronic back pain    spinal stenosis  . Colon cancer ([place])   . Complication of anesthesia    B/P bottoms out, hard to wake up   . Contact dermatitis   . COPD (chronic obstructive pulmonary disease) (HCC)    Albuterol inhaler and DuoNeb as needed  . Coronary artery disease    mild by 08/25/09 cath  . Foot drop, left   . Headache   . Heart murmur    never had any problems  . History of blood transfusion    no abnormal reaction noted  . History of bronchitis   . History of MRSA infection 6+ yrs ago  . Hypertension    takes Metoprolol daily  . Insomnia    takes Trazodone nightly  . Joint pain   . Joint swelling   . Nocturia   . Panic attacks    takes Effexor daily  . Peripheral edema    takes Lasix daily and takes Bumex every 3 days.Takes Aldactone daily   . Pulmonary emboli ([place]) 2018   . Restless leg syndrome    takes Requip daily  . RLS (restless legs syndrome)   . Sleep apnea    doesnt use CPAP    Patient Active Problem List   Diagnosis Date Noted  . Chronic bilateral low back pain with left-sided sciatica (Primary Area of Pain) 03/12/2018  . Chronic pain of left lower extremity (Secondary Area of Pain) 03/12/2018  . Chronic pain syndrome 03/12/2018  . Long term current use of opiate analgesic 03/12/2018  . Pharmacologic therapy 03/12/2018  . Disorder of skeletal system 03/12/2018  . Problems influencing health status 03/12/2018  . Hyperparathyroidism, primary ([place]) 01/20/2018  . Medicare annual wellness visit, initial 09/02/2017  . Acute saddle pulmonary embolism with acute cor pulmonale ([place]) 02/20/2017  . Dyspnea 12/29/2016  . Acute congestive heart failure ([place])   . HCAP (healthcare-associated pneumonia)   . Pulmonary embolus ([place])   . Lumbar adjacent segment disease with spondylolisthesis 12/25/2016  . Aortic atherosclerosis ([place]) 08/14/2016  . Lymphedema of left leg 08/05/2016  . B12 deficiency 05/15/2016  . Chronic contact dermatitis 05/01/2016  . History of colon cancer 05/01/2016  . Mild chronic obstructive pulmonary disease ([place]) 05/01/2016  . COPD with acute exacerbation ([place]) 11/27/2015  . COPD exacerbation ([place]) 08/21/2015  . Spinal  stenosis of lumbar region 04/02/2015  . Restless legs 02/27/2015  . Hypertension   . Tobacco use disorder 09/12/2014  . Anemia 06/17/2014  . Sedated due to medication 06/17/2014  . Obstructive sleep apnea 06/16/2014  . Benign essential hypertension 06/16/2014  . Restless legs syndrome 06/16/2014  . Colon cancer ([place])   . Depression   . History of total hip replacement, left 10/25/2013  . S/P total hip arthroplasty 10/25/2013    Past Surgical History:  Procedure Laterality Date  . ABDOMINAL HYSTERECTOMY    . BACK SURGERY    . CHOLECYSTECTOMY    . COLON SURGERY     partial colectomy d/t cancer cells   . COLONOSCOPY    . I&D of left hip      x 4  . JOINT REPLACEMENT    . Resection total hip    . Revision of total hip arthroplasty     x3-4  . right hip replacement    . TOTAL HIP ARTHROPLASTY Left     Prior to Admission medications   Medication Sig Start Date End Date Taking? Authorizing Provider  albuterol (PROVENTIL HFA;VENTOLIN HFA) 108 (90 Base) MCG/ACT inhaler Inhale 2 puffs into the lungs every 4 (four) hours as needed for wheezing or shortness of breath. 12/05/15   Loletha Grayer, MD  aspirin EC 81 MG tablet Take 81 mg by mouth daily.    [provider]  budesonide-formoterol (SYMBICORT) 160-4.5 MCG/ACT inhaler Inhale 2 puffs into the lungs 2 (two) times daily. Patient taking differently: Inhale 2 puffs into the lungs 2 (two) times daily as needed (for respiratory issues).  12/05/15   Loletha Grayer, MD  cyclobenzaprine (FLEXERIL) 10 MG tablet Take 1 tablet (10 mg total) by mouth 3 (three) times daily as needed for muscle spasms. 04/04/18   Newman Pies, MD  docusate sodium (COLACE) 100 MG capsule Take 1 capsule (100 mg total) by mouth 2 (two) times daily. 04/04/18   Newman Pies, MD  fluticasone [facility]) 50 MCG/ACT nasal spray Place 1 spray into both nostrils daily as needed (for sinus issues).     [provider]  furosemide (LASIX) 20 MG tablet Take 1 tablet (20 mg total) by mouth daily. Patient taking differently: Take 20 mg by mouth 2 (two) times daily.  04/29/16 02/28/26  Lavonia Drafts, MD  gabapentin (NEURONTIN) 100 MG capsule Take 200 mg by mouth at bedtime. 02/15/18   [provider]  ipratropium-albuterol (DUONEB) 0.5-2.5 (3) MG/3ML SOLN Take 3 mLs by nebulization every 6 (six) hours as needed (for wheezing/shortness of breath).    [provider]  methocarbamol (ROBAXIN) 500 MG tablet Take 1-2 tablets (500-1,000 mg total) by mouth 4 (four) times daily as needed for muscle spasms. Patient taking differently: Take 500 mg by mouth  4 (four) times daily as needed for muscle spasms.  7/90/24   Delora Fuel, MD  metoprolol succinate (TOPROL-XL) 50 MG 24 hr tablet Take 1 tablet (50 mg total) by mouth every evening. Take with or immediately following a meal. Patient taking differently: Take 50 mg by mouth at bedtime. Take with or immediately following a meal. 12/26/15   Crissman, Jeannette How, MD  oxyCODONE 10 MG TABS Take 1 tablet (10 mg total) by mouth every 4 (four) hours as needed for severe pain ((score 7 to 10)). 04/04/18   Newman Pies, MD  PARoxetine (PAXIL) 40 MG tablet Take 40 mg by mouth at bedtime.     [provider]  rOPINIRole (REQUIP) 3 MG  tablet Take 1 tablet (3 mg total) by mouth 4 (four) times daily. 04/20/15   [name], MD  spironolactone (ALDACTONE) 25 MG tablet Take 25 mg by mouth every evening.    [provider]    Allergies  Allergen Reactions  . Hydrocodone-Chlorpheniramine Shortness Of Breath  . Sulfa Antibiotics Shortness Of Breath  . Chlorhexidine Rash  . Methadone Other (See Comments)    Altered Mental Status  . Benadryl [Diphenhydramine] Anxiety  . Latex Rash  . Tape Rash    "the white kind they use after surgery"    Family History  Problem Relation Age of Onset  . Cancer Mother   . Stroke Father   . Diabetes Sister   . Mental illness Sister     Social History Social History   Tobacco Use  . Smoking status: Current Every Day Smoker    Packs/day: 0.75    Years: 51.00    Pack years: 38.25    Types: Cigarettes    Start date: 07/08/1964  . Smokeless tobacco: Never Used  Substance Use Topics  . Alcohol use: No  . Drug use: No    Review of Systems  Constitutional: Negative for fever. Eyes: Negative for visual changes. ENT: Negative for sore throat. Cardiovascular: Negative for chest pain. Respiratory: Negative for shortness of breath. Gastrointestinal: Negative for abdominal pain, vomiting and diarrhea. Genitourinary: Negative for  dysuria. Musculoskeletal: Negative for back pain. Skin: Negative for rash. Neurological: Negative for headache.  ____________________________________________   PHYSICAL EXAM:  VITAL SIGNS: ED Triage Vitals  Enc Vitals Group     BP 04/13/18 1835 (!) 142/73     Pulse Rate 04/13/18 1835 84     Resp 04/13/18 1835 18     Temp 04/13/18 1835 97.7 F (36.5 C)     Temp Source 04/13/18 1835 Oral     SpO2 04/13/18 1835 98 %     Weight 04/13/18 1836 174 lb (78.9 kg)     Height 04/13/18 1836 5' (1.524 m)     Head Circumference --      Peak Flow --      Pain Score 04/13/18 1835 0     Pain Loc --      Pain Edu? --      Excl. in [place]? --      Constitutional: Alert and oriented.  HEENT      Head: Normocephalic and atraumatic.      Eyes: Conjunctivae are normal. Pupils equal and round.       Ears:         Nose: No congestion/rhinnorhea.      Mouth/Throat: Mucous membranes are moist.      Neck: No stridor. Cardiovascular/Chest: Normal rate, regular rhythm.  No murmurs, rubs, or gallops. Respiratory: Normal respiratory effort without tachypnea nor retractions. Breath sounds are clear and equal bilaterally. No wheezes/rales/rhonchi. Gastrointestinal: Soft. No distention, no guarding, no rebound. Nontender.    Genitourinary/rectal:Deferred Musculoskeletal: Nontender with normal range of motion in all extremities. No joint effusions.  No lower extremity tenderness.  No edema. Neurologic:  Normal speech and language. No gross or focal neurologic deficits are appreciated. Skin:  Skin is warm, dry and intact. No rash noted. Psychiatric: Mood and affect are normal. Speech and behavior are normal. Patient exhibits appropriate insight and judgment.   ____________________________________________  LABS (pertinent positives/negatives) I, Lisa Roca, MD the attending physician have reviewed the labs noted below.  Labs Reviewed - No data to  display  ____________________________________________  EKG I, Lisa Roca, MD, the attending physician have personally viewed and interpreted all ECGs.  None ____________________________________________  RADIOLOGY   None __________________________________________  PROCEDURES  Procedure(s) performed: None  Procedures  Critical Care performed: None   ____________________________________________  ED COURSE / ASSESSMENT AND PLAN  Pertinent labs & imaging results that were available during my care of the patient were reviewed by me and considered in my medical decision making (see chart for details).     Patient has a normal neurologic exam and normal mental status now.  Her husband is here bedside, he works here at the hospital.  Patient and he agreed that she is at her normal mental status.  She is just a little frustrated that she was made to come over here but states she understands why.  I do not think that she had a episode of TIA.  Patient declines any further work-up I think this is reasonable.    CONSULTATIONS:   None   Patient / Family / Caregiver informed of clinical course, medical decision-making process, and agree with plan.   I discussed return precautions, follow-up instructions, and discharge instructions with patient and/or family.  Discharge Instructions : Return to the emergency department immediately for any confusion or altered mental status, weakness, numbness, fever, or any other symptoms concerning to you.    ___________________________________________   FINAL CLINICAL IMPRESSION(S) / ED DIAGNOSES   Final diagnoses:  Altered mental status, unspecified altered mental status type  Normal exam      ___________________________________________         Note: This dictation was prepared with Dragon dictation. Any transcriptional errors that result from this process are unintentional    Lisa Roca, MD 04/13/18 [phone]

## 2018-04-13 NOTE — ED Triage Notes (Signed)
Pt comes via EMS from home with c/o  AMS. EMS reports they were called out for possible confusion by pt's PT that just came out today. Pt's PCP was called and advised pt to come and just get checked out. Per EMS she had patient recently and pt is A&OX4 per her. Pt is regular and baseline. Husband at bedside and states pt is at her baseline. Pt requesting to go home and states she feels fine and shouldn't be here. VSS

## 2018-04-13 NOTE — ED Notes (Signed)
Pt verbalized discharge instructions and has no questions at this time 

## 2018-05-22 ENCOUNTER — Other Ambulatory Visit: Payer: Self-pay

## 2018-05-22 ENCOUNTER — Emergency Department
Admission: EM | Admit: 2018-05-22 | Discharge: 2018-05-22 | Disposition: A | Discharge status: Home / Self Care | Payer: Medicare HMO | Attending: Emergency Medicine | Admitting: Emergency Medicine

## 2018-05-22 ENCOUNTER — Emergency Department: Payer: Medicare HMO

## 2018-05-22 DIAGNOSIS — N390 Urinary tract infection, site not specified: Secondary | ICD-10-CM | POA: Diagnosis not present

## 2018-05-22 DIAGNOSIS — Z7982 Long term (current) use of aspirin: Secondary | ICD-10-CM | POA: Diagnosis not present

## 2018-05-22 DIAGNOSIS — I2510 Atherosclerotic heart disease of native coronary artery without angina pectoris: Secondary | ICD-10-CM | POA: Diagnosis not present

## 2018-05-22 DIAGNOSIS — Z79899 Other long term (current) drug therapy: Secondary | ICD-10-CM | POA: Diagnosis not present

## 2018-05-22 DIAGNOSIS — Z96643 Presence of artificial hip joint, bilateral: Secondary | ICD-10-CM | POA: Diagnosis not present

## 2018-05-22 DIAGNOSIS — Z91040 Latex allergy status: Secondary | ICD-10-CM | POA: Diagnosis not present

## 2018-05-22 DIAGNOSIS — I509 Heart failure, unspecified: Secondary | ICD-10-CM | POA: Insufficient documentation

## 2018-05-22 DIAGNOSIS — J449 Chronic obstructive pulmonary disease, unspecified: Secondary | ICD-10-CM | POA: Insufficient documentation

## 2018-05-22 DIAGNOSIS — R42 Dizziness and giddiness: Secondary | ICD-10-CM

## 2018-05-22 DIAGNOSIS — I110 Hypertensive heart disease with heart failure: Secondary | ICD-10-CM | POA: Diagnosis not present

## 2018-05-22 DIAGNOSIS — F17210 Nicotine dependence, cigarettes, uncomplicated: Secondary | ICD-10-CM | POA: Insufficient documentation

## 2018-05-22 DIAGNOSIS — G4700 Insomnia, unspecified: Secondary | ICD-10-CM

## 2018-05-22 LAB — URINALYSIS, COMPLETE (UACMP) WITH MICROSCOPIC
BILIRUBIN URINE: NEGATIVE
Glucose, UA: NEGATIVE mg/dL
Ketones, ur: NEGATIVE mg/dL
NITRITE: POSITIVE — AB
Protein, ur: NEGATIVE mg/dL
SPECIFIC GRAVITY, URINE: 1.02 (ref 1.005–1.030)
WBC, UA: >50 WBC/hpf — ABNORMAL HIGH (ref 0–5)
pH: 5 (ref 5.0–8.0)

## 2018-05-22 LAB — CBC WITH DIFFERENTIAL/PLATELET
ABS IMMATURE GRANULOCYTES: 0.1 10*3/uL — AB (ref 0.00–0.07)
Basophils Absolute: 0 10*3/uL (ref 0.0–0.1)
Basophils Relative: 0 %
EOS ABS: 0.2 10*3/uL (ref 0.0–0.5)
Eosinophils Relative: 2 %
HEMATOCRIT: 43 % (ref 36.0–46.0)
Hemoglobin: 14.1 g/dL (ref 12.0–15.0)
IMMATURE GRANULOCYTES: 1 %
LYMPHS ABS: 1.5 10*3/uL (ref 0.7–4.0)
Lymphocytes Relative: 21 %
MCH: 30.5 pg (ref 26.0–34.0)
MCHC: 32.8 g/dL (ref 30.0–36.0)
MCV: 93.1 fL (ref 80.0–100.0)
MONOS PCT: 6 %
Monocytes Absolute: 0.4 10*3/uL (ref 0.1–1.0)
NEUTROS PCT: 70 %
Neutro Abs: 4.8 10*3/uL (ref 1.7–7.7)
Platelets: 250 10*3/uL (ref 150–400)
RBC: 4.62 MIL/uL (ref 3.87–5.11)
RDW: 13.9 % (ref 11.5–15.5)
WBC: 6.9 10*3/uL (ref 4.0–10.5)
nRBC: 0 % (ref 0.0–0.2)

## 2018-05-22 LAB — URINE DRUG SCREEN, QUALITATIVE (ARMC ONLY)
Amphetamines, Ur Screen: NOT DETECTED
Barbiturates, Ur Screen: NOT DETECTED
Benzodiazepine, Ur Scrn: NOT DETECTED
COCAINE METABOLITE, UR ~~LOC~~: NOT DETECTED
Cannabinoid 50 Ng, Ur ~~LOC~~: NOT DETECTED
MDMA (ECSTASY) UR SCREEN: NOT DETECTED
Methadone Scn, Ur: NOT DETECTED
Opiate, Ur Screen: NOT DETECTED
Phencyclidine (PCP) Ur S: NOT DETECTED
Tricyclic, Ur Screen: NOT DETECTED

## 2018-05-22 LAB — COMPREHENSIVE METABOLIC PANEL
ALK PHOS: 110 U/L (ref 38–126)
ALT: 15 U/L (ref 0–44)
ANION GAP: 11 (ref 5–15)
AST: 22 U/L (ref 15–41)
Albumin: 3.9 g/dL (ref 3.5–5.0)
BILIRUBIN TOTAL: 0.6 mg/dL (ref 0.3–1.2)
BUN: 24 mg/dL — ABNORMAL HIGH (ref 8–23)
CALCIUM: 10.4 mg/dL — AB (ref 8.9–10.3)
CO2: 25 mmol/L (ref 22–32)
Chloride: 104 mmol/L (ref 98–111)
Creatinine, Ser: 0.67 mg/dL (ref 0.44–1.00)
Glucose, Bld: 104 mg/dL — ABNORMAL HIGH (ref 70–99)
Potassium: 3.9 mmol/L (ref 3.5–5.1)
SODIUM: 140 mmol/L (ref 135–145)
TOTAL PROTEIN: 7.3 g/dL (ref 6.5–8.1)

## 2018-05-22 LAB — AMMONIA: Ammonia: 18 umol/L (ref 9–35)

## 2018-05-22 LAB — ETHANOL

## 2018-05-22 MED ORDER — CEPHALEXIN 500 MG PO CAPS: 500.0000 mg | ORAL_CAPSULE | Freq: Three times a day (TID) | ORAL | 0 refills | Status: AC

## 2018-05-22 MED ORDER — LORAZEPAM 1 MG PO TABS: 1.0000 mg | ORAL_TABLET | Freq: Every day | ORAL | 0 refills | Status: AC

## 2018-05-22 MED ORDER — SODIUM CHLORIDE 0.9 % IV SOLN
1.0000 g | Freq: Once | INTRAVENOUS | Status: AC
  Administered 2018-05-22: 1 g via INTRAVENOUS
  Filled 2018-05-22: qty 10

## 2018-05-22 NOTE — ED Notes (Signed)
Patient denies pain and is resting comfortably.  

## 2018-05-22 NOTE — ED Notes (Addendum)
Laying 137/82 HR 95 Sitting 135/83 HR 103 Standing 129/73 HR 103  Pt c/o dizziness with going from laying to sitting.

## 2018-05-22 NOTE — Discharge Instructions (Signed)
Very strongly feel that most of your symptoms today were secondary to not getting enough sleep.  We would very strongly advise you not to use any screens including her phone or television after 9 PM I would advise reading instead as it may help you fall asleep.  I would in the future try to limit napping during the day and try to focus your sleep on the evening time.  If you have any numbness weakness chest pain shortness of breath headache difficulty talking or walking or other concerns return to the emergency department.  I would also advised that you follow closely with your primary care doctor for help with your sleep patterns.  I will give you a single pill to take tonight when you go to bed but I cannot prescribe long-term medication for this.  We are giving you antibiotics for urinary tract infection, if you have fever, pain in your sides vomiting, you feel confused or you have other concerns please return to the emergency room.

## 2018-05-22 NOTE — ED Triage Notes (Signed)
Pt states "I just felt strange and disoriented." family member states began at 5am. Denies pain. A&O, ambulatory. Speaking in clear complete sentences. No weakness noted.

## 2018-05-22 NOTE — ED Notes (Signed)
Pt in NAD and given instructions for care and follow-up.

## 2018-05-22 NOTE — ED Notes (Signed)
FIRST NURSE NOTE:  Pt c/o elevated blood pressure, pt took BP meds last night.

## 2018-05-22 NOTE — ED Notes (Signed)
Patient transported to CT 

## 2018-05-22 NOTE — ED Provider Notes (Addendum)
[facility] Emergency Department Provider Note  ____________________________________________   I have reviewed the triage vital signs and the nursing notes. Where available I have reviewed prior notes and, if possible and indicated, outside hospital notes.    HISTORY  Chief Complaint Dizziness    HPI Ann Silva is a 72 y.o. female   who presents today complaining of feeling a little bit "weird" this morning.  Patient states that she has trouble sleeping and has chronically had trouble sleeping, prior notes indicate that is not unusual for her to sleep during the day and not sleep at night.  Patient often will stay up all night playing video games on her phone or watching TV.  Sometimes is worse than others.  Patient states she really has not slept in 2 days.  She denies depression SI or HI.  She denies abuse.  She states that after being awake for nearly 2 days in a row, she felt "weird" and had trouble focusing this morning.  She denies any new medications.  She states she is no longer taking her narcotic pain medication.  She did have back surgery last month, and has weaned herself off of her narcotics as of 2 weeks ago.  However, since going off the narcotics her sleep is been disrupted.  She denies any focal numbness or weakness headache stiff neck chest pain shortness of breath difficulty speaking or understanding she just states that she became a little confused in ways that she cannot describe.    Past Medical History:  Diagnosis Date  . Anemia    takes ferrous sulfate daily  . Anxiety   . Arthritis   . Bladder spasm   . Chronic back pain    spinal stenosis  . Colon cancer ([place])   . Complication of anesthesia    B/P bottoms out, hard to wake up   . Contact dermatitis   . COPD (chronic obstructive pulmonary disease) (HCC)    Albuterol inhaler and DuoNeb as needed  . Coronary artery disease    mild by 08/25/09 cath  . Foot drop, left   . Headache    . Heart murmur    never had any problems  . History of blood transfusion    no abnormal reaction noted  . History of bronchitis   . History of MRSA infection 6+ yrs ago  . Hypertension    takes Metoprolol daily  . Insomnia    takes Trazodone nightly  . Joint pain   . Joint swelling   . Nocturia   . Panic attacks    takes Effexor daily  . Peripheral edema    takes Lasix daily and takes Bumex every 3 days.Takes Aldactone daily   . Pulmonary emboli ([place]) 2018  . Restless leg syndrome    takes Requip daily  . RLS (restless legs syndrome)   . Sleep apnea    doesnt use CPAP    Patient Active Problem List   Diagnosis Date Noted  . Chronic bilateral low back pain with left-sided sciatica (Primary Area of Pain) 03/12/2018  . Chronic pain of left lower extremity (Secondary Area of Pain) 03/12/2018  . Chronic pain syndrome 03/12/2018  . Long term current use of opiate analgesic 03/12/2018  . Pharmacologic therapy 03/12/2018  . Disorder of skeletal system 03/12/2018  . Problems influencing health status 03/12/2018  . Hyperparathyroidism, primary ([place]) 01/20/2018  . Medicare annual wellness visit, initial 09/02/2017  . Acute saddle pulmonary embolism with acute cor  pulmonale ([place]) 02/20/2017  . Dyspnea 12/29/2016  . Acute congestive heart failure ([place])   . HCAP (healthcare-associated pneumonia)   . Pulmonary embolus ([place])   . Lumbar adjacent segment disease with spondylolisthesis 12/25/2016  . Aortic atherosclerosis ([place]) 08/14/2016  . Lymphedema of left leg 08/05/2016  . B12 deficiency 05/15/2016  . Chronic contact dermatitis 05/01/2016  . History of colon cancer 05/01/2016  . Mild chronic obstructive pulmonary disease ([place]) 05/01/2016  . COPD with acute exacerbation ([place]) 11/27/2015  . COPD exacerbation ([place]) 08/21/2015  . Spinal stenosis of lumbar region 04/02/2015  . Restless legs 02/27/2015  . Hypertension   . Tobacco use disorder 09/12/2014  . Anemia 06/17/2014  .  Sedated due to medication 06/17/2014  . Obstructive sleep apnea 06/16/2014  . Benign essential hypertension 06/16/2014  . Restless legs syndrome 06/16/2014  . Colon cancer ([place])   . Depression   . History of total hip replacement, left 10/25/2013  . S/P total hip arthroplasty 10/25/2013    Past Surgical History:  Procedure Laterality Date  . ABDOMINAL HYSTERECTOMY    . BACK SURGERY    . CHOLECYSTECTOMY    . COLON SURGERY     partial colectomy d/t cancer cells  . COLONOSCOPY    . I&D of left hip      x 4  . JOINT REPLACEMENT    . Resection total hip    . Revision of total hip arthroplasty     x3-4  . right hip replacement    . TOTAL HIP ARTHROPLASTY Left     Prior to Admission medications   Medication Sig Start Date End Date Taking? Authorizing Provider  albuterol (PROVENTIL HFA;VENTOLIN HFA) 108 (90 Base) MCG/ACT inhaler Inhale 2 puffs into the lungs every 4 (four) hours as needed for wheezing or shortness of breath. 12/05/15   Loletha Grayer, MD  aspirin EC 81 MG tablet Take 81 mg by mouth daily.    [provider]  budesonide-formoterol (SYMBICORT) 160-4.5 MCG/ACT inhaler Inhale 2 puffs into the lungs 2 (two) times daily. Patient taking differently: Inhale 2 puffs into the lungs 2 (two) times daily as needed (for respiratory issues).  12/05/15   Loletha Grayer, MD  cyclobenzaprine (FLEXERIL) 10 MG tablet Take 1 tablet (10 mg total) by mouth 3 (three) times daily as needed for muscle spasms. 04/04/18   Newman Pies, MD  docusate sodium (COLACE) 100 MG capsule Take 1 capsule (100 mg total) by mouth 2 (two) times daily. 04/04/18   Newman Pies, MD  fluticasone [facility]) 50 MCG/ACT nasal spray Place 1 spray into both nostrils daily as needed (for sinus issues).     [provider]  furosemide (LASIX) 20 MG tablet Take 1 tablet (20 mg total) by mouth daily. Patient taking differently: Take 20 mg by mouth 2 (two) times daily.  04/29/16 02/28/26  Lavonia Drafts, MD  gabapentin (NEURONTIN) 100 MG capsule Take 200 mg by mouth at bedtime. 02/15/18   [provider]  ipratropium-albuterol (DUONEB) 0.5-2.5 (3) MG/3ML SOLN Take 3 mLs by nebulization every 6 (six) hours as needed (for wheezing/shortness of breath).    [provider]  methocarbamol (ROBAXIN) 500 MG tablet Take 1-2 tablets (500-1,000 mg total) by mouth 4 (four) times daily as needed for muscle spasms. Patient taking differently: Take 500 mg by mouth 4 (four) times daily as needed for muscle spasms.  5/62/13   Delora Fuel, MD  metoprolol succinate (TOPROL-XL) 50 MG 24 hr tablet Take 1 tablet (50 mg  total) by mouth every evening. Take with or immediately following a meal. Patient taking differently: Take 50 mg by mouth at bedtime. Take with or immediately following a meal. 12/26/15   Crissman, Jeannette How, MD  oxyCODONE 10 MG TABS Take 1 tablet (10 mg total) by mouth every 4 (four) hours as needed for severe pain ((score 7 to 10)). 04/04/18   Newman Pies, MD  PARoxetine (PAXIL) 40 MG tablet Take 40 mg by mouth at bedtime.     [provider]  rOPINIRole (REQUIP) 3 MG tablet Take 1 tablet (3 mg total) by mouth 4 (four) times daily. 04/20/15   [name], MD  spironolactone (ALDACTONE) 25 MG tablet Take 25 mg by mouth every evening.    [provider]    Allergies Hydrocodone-chlorpheniramine; Sulfa antibiotics; Chlorhexidine; Methadone; Benadryl [diphenhydramine]; Latex; and Tape  Family History  Problem Relation Age of Onset  . Cancer Mother   . Stroke Father   . Diabetes Sister   . Mental illness Sister     Social History Social History   Tobacco Use  . Smoking status: Current Every Day Smoker    Packs/day: 0.75    Years: 51.00    Pack years: 38.25    Types: Cigarettes    Start date: 07/08/1964  . Smokeless tobacco: Never Used  Substance Use Topics  . Alcohol use: No  . Drug use: No    Review of Systems Constitutional: No  fever/chills Eyes: No visual changes. ENT: No sore throat. No stiff neck no neck pain Cardiovascular: Denies chest pain. Respiratory: Denies shortness of breath. Gastrointestinal:   no vomiting.  No diarrhea.  No constipation. Genitourinary: Negative for dysuria. Musculoskeletal: Negative lower extremity swelling Skin: Negative for rash. Neurological: Negative for severe headaches, focal weakness or numbness.   ____________________________________________   PHYSICAL EXAM:  VITAL SIGNS: ED Triage Vitals  Enc Vitals Group     BP 05/22/18 0806 140/84     Pulse Rate 05/22/18 0806 (!) 105     Resp 05/22/18 0806 20     Temp 05/22/18 0806 98.1 F (36.7 C)     Temp Source 05/22/18 0806 Oral     SpO2 05/22/18 0806 96 %     Weight 05/22/18 0807 166 lb (75.3 kg)     Height 05/22/18 0807 5' (1.524 m)     Head Circumference --      Peak Flow --      Pain Score 05/22/18 0812 0     Pain Loc --      Pain Edu? --      Excl. in [place]? --     Constitutional: Alert and oriented. Well appearing and in no acute distress. Eyes: Conjunctivae are normal Head: Atraumatic HEENT: No congestion/rhinnorhea. Mucous membranes are moist.  Oropharynx non-erythematous Neck:   Nontender with no meningismus, no masses, no stridor Cardiovascular: Normal rate, regular rhythm. Grossly normal heart sounds.  Good peripheral circulation. Respiratory: Normal respiratory effort.  No retractions. Lungs CTAB. Abdominal: Soft and nontender. No distention. No guarding no rebound Back:  There is no focal tenderness or step off.  there is no midline tenderness there are no lesions noted. there is no CVA tenderness Musculoskeletal: No lower extremity tenderness, no upper extremity tenderness. No joint effusions, no DVT signs strong distal pulses no edema Neurologic: Cranial nerves II through XII are grossly intact 5 out of 5 strength bilateral upper and lower extremity. Finger to nose within normal limits heel to shin  within  normal limits, speech is normal with no word finding difficulty or dysarthria, reflexes symmetric, pupils are equally round and reactive to light, there is no pronator drift, sensation is normal, vision is intact to confrontation, gait is deferred, there is no nystagmus, normal neurologic exam Skin:  Skin is warm, dry and intact. No rash noted. Psychiatric: Mood and affect are normal. Speech and behavior are normal.  ____________________________________________   LABS (all labs ordered are listed, but only abnormal results are displayed)  Labs Reviewed  CBC WITH DIFFERENTIAL/PLATELET  URINALYSIS, COMPLETE (UACMP) WITH MICROSCOPIC  ETHANOL  URINE DRUG SCREEN, QUALITATIVE ([place])  COMPREHENSIVE METABOLIC PANEL  AMMONIA    Pertinent labs  results that were available during my care of the patient were reviewed by me and considered in my medical decision making (see chart for details). ____________________________________________  EKG  I personally interpreted any EKGs ordered by me or triage  ____________________________________________  RADIOLOGY  Pertinent labs & imaging results that were available during my care of the patient were reviewed by me and considered in my medical decision making (see chart for details). If possible, patient and/or family made aware of any abnormal findings.  No results found. ____________________________________________    PROCEDURES  Procedure(s) performed: None  Procedures  Critical Care performed: None  ____________________________________________   INITIAL IMPRESSION / ASSESSMENT AND PLAN / ED COURSE  Pertinent labs & imaging results that were available during my care of the patient were reviewed by me and considered in my medical decision making (see chart for details).  Patient here complaining of feeling "not quite right" after not sleeping for 2 days.  Neurologic exam is reassuring, patient has some very poor sleep  hygiene and this is a chronic problem but is been made worse after she is gone off her narcotics.  She is not up all night she states because of pain she states her back pain is actually quite well controlled, neurologically she has no focal complaints she just stated that she felt unwell and some vaguely described manner this morning.  Her NIH stroke scale is 0.  Nonetheless I will obtain a CT scan as sometimes but declines can cause people her age to have vague symptoms.  Will check basic blood work urinalysis urine drug screen EtOH although she denies it, we will check ammonia level, will reassess.  Most likely, patient's really just suffering from an absence of sleep  ----------------------------------------- 10:48 AM on 05/22/2018 -----------------------------------------  Remains neurologically intact, but she has been here she did sleep for some time and she woke up feeling much refreshed.  NIH stroke scale is still 0.  We discussed admission versus discharge she is adamant that she would like to go home.  I think her primary problem is lack of sleep.  She has not had any hallucinations no SI no HI she does not feel that she is still depressed, I do not think she requires psychiatric admission for this she is already followed with her primary care and is been started on gabapentin I have advised her strongly to follow closely with him or her to see if they can increase or change the dose or try other remedies for her chronic insomnia.  Patient has poor sleep hygiene, she is a playing video games and watching television.  I advised her to have no screen time after 9:00.  I have advised her gentle exercise during the day to help tire her out.  I have advised that she return to the  emergency room for any signs of worsening symptoms including fever vomiting numbness weakness headache etc.  After taking a nap, she feels 100% better her husband states that she has been at her baseline so she came in this is a  few seconds where she seemed to be "acting weird" when she was really tired this morning.  CT scan is reassuring.  Patient will be discharged with antibiotics for UTI urine culture has been sent, and we will    ____________________________________________   FINAL CLINICAL IMPRESSION(S) / ED DIAGNOSES  Final diagnoses:  None      This chart was dictated using voice recognition software.  Despite best efforts to proofread,  errors can occur which can change meaning.      Schuyler Amor, MD 05/22/18 9996    Schuyler Amor, MD 05/22/18 1050

## 2018-05-24 LAB — URINE CULTURE: Culture: >=100000 — AB

## 2018-06-22 ENCOUNTER — Other Ambulatory Visit: Payer: Self-pay | Admitting: Sports Medicine

## 2018-06-22 DIAGNOSIS — M25562 Pain in left knee: Principal | ICD-10-CM

## 2018-06-22 DIAGNOSIS — G8929 Other chronic pain: Secondary | ICD-10-CM

## 2018-06-22 DIAGNOSIS — M11262 Other chondrocalcinosis, left knee: Secondary | ICD-10-CM

## 2018-06-25 ENCOUNTER — Emergency Department (HOSPITAL_COMMUNITY): Payer: Medicare HMO

## 2018-06-25 ENCOUNTER — Encounter (HOSPITAL_COMMUNITY): Payer: Self-pay | Admitting: Emergency Medicine

## 2018-06-25 ENCOUNTER — Emergency Department (HOSPITAL_COMMUNITY)
Admission: EM | Admit: 2018-06-25 | Discharge: 2018-06-25 | Disposition: A | Discharge status: Home / Self Care | Payer: Medicare HMO | Attending: Emergency Medicine | Admitting: Emergency Medicine

## 2018-06-25 DIAGNOSIS — I2510 Atherosclerotic heart disease of native coronary artery without angina pectoris: Secondary | ICD-10-CM | POA: Diagnosis not present

## 2018-06-25 DIAGNOSIS — F17210 Nicotine dependence, cigarettes, uncomplicated: Secondary | ICD-10-CM | POA: Insufficient documentation

## 2018-06-25 DIAGNOSIS — M5442 Lumbago with sciatica, left side: Secondary | ICD-10-CM

## 2018-06-25 DIAGNOSIS — Z96642 Presence of left artificial hip joint: Secondary | ICD-10-CM | POA: Diagnosis not present

## 2018-06-25 DIAGNOSIS — M545 Low back pain: Secondary | ICD-10-CM | POA: Diagnosis present

## 2018-06-25 DIAGNOSIS — Z91040 Latex allergy status: Secondary | ICD-10-CM | POA: Insufficient documentation

## 2018-06-25 DIAGNOSIS — I10 Essential (primary) hypertension: Secondary | ICD-10-CM | POA: Insufficient documentation

## 2018-06-25 DIAGNOSIS — J449 Chronic obstructive pulmonary disease, unspecified: Secondary | ICD-10-CM | POA: Insufficient documentation

## 2018-06-25 DIAGNOSIS — Z79899 Other long term (current) drug therapy: Secondary | ICD-10-CM | POA: Diagnosis not present

## 2018-06-25 LAB — CBC WITH DIFFERENTIAL/PLATELET
Abs Immature Granulocytes: 0.04 10*3/uL (ref 0.00–0.07)
BASOS ABS: 0 10*3/uL (ref 0.0–0.1)
Basophils Relative: 0 %
EOS ABS: 0.2 10*3/uL (ref 0.0–0.5)
Eosinophils Relative: 3 %
HEMATOCRIT: 42.8 % (ref 36.0–46.0)
Hemoglobin: 13.6 g/dL (ref 12.0–15.0)
Immature Granulocytes: 1 %
LYMPHS ABS: 1.4 10*3/uL (ref 0.7–4.0)
LYMPHS PCT: 18 %
MCH: 30.3 pg (ref 26.0–34.0)
MCHC: 31.8 g/dL (ref 30.0–36.0)
MCV: 95.3 fL (ref 80.0–100.0)
Monocytes Absolute: 0.6 10*3/uL (ref 0.1–1.0)
Monocytes Relative: 8 %
NEUTROS PCT: 70 %
NRBC: 0 % (ref 0.0–0.2)
Neutro Abs: 5.5 10*3/uL (ref 1.7–7.7)
Platelets: 171 10*3/uL (ref 150–400)
RBC: 4.49 MIL/uL (ref 3.87–5.11)
RDW: 14.2 % (ref 11.5–15.5)
WBC: 7.8 10*3/uL (ref 4.0–10.5)

## 2018-06-25 LAB — URINALYSIS, ROUTINE W REFLEX MICROSCOPIC
Bilirubin Urine: NEGATIVE
Glucose, UA: NEGATIVE mg/dL
Hgb urine dipstick: NEGATIVE
Ketones, ur: NEGATIVE mg/dL
Leukocytes, UA: NEGATIVE
NITRITE: NEGATIVE
PROTEIN: NEGATIVE mg/dL
Specific Gravity, Urine: 1.015 (ref 1.005–1.030)
pH: 7 (ref 5.0–8.0)

## 2018-06-25 LAB — BASIC METABOLIC PANEL
ANION GAP: 8 (ref 5–15)
BUN: 14 mg/dL (ref 8–23)
CALCIUM: 10.3 mg/dL (ref 8.9–10.3)
CO2: 28 mmol/L (ref 22–32)
Chloride: 103 mmol/L (ref 98–111)
Creatinine, Ser: 0.79 mg/dL (ref 0.44–1.00)
GFR calc Af Amer: >60 mL/min (ref >60)
Glucose, Bld: 112 mg/dL — ABNORMAL HIGH (ref 70–99)
Potassium: 3.9 mmol/L (ref 3.5–5.1)
Sodium: 139 mmol/L (ref 135–145)

## 2018-06-25 MED ORDER — HYDROMORPHONE HCL 1 MG/ML IJ SOLN
1.0000 mg | Freq: Once | INTRAMUSCULAR | Status: AC
  Administered 2018-06-25: 1 mg via INTRAMUSCULAR
  Filled 2018-06-25: qty 1

## 2018-06-25 MED ORDER — NAPROXEN 375 MG PO TABS: 375.0000 mg | ORAL_TABLET | Freq: Two times a day (BID) | ORAL | 0 refills | Status: DC

## 2018-06-25 MED ORDER — METHOCARBAMOL 1000 MG/10ML IJ SOLN
1000.0000 mg | Freq: Once | INTRAVENOUS | Status: AC
  Administered 2018-06-25: 1000 mg via INTRAVENOUS
  Filled 2018-06-25: qty 10

## 2018-06-25 MED ORDER — IOPAMIDOL (ISOVUE-370) INJECTION 76%
100.0000 mL | Freq: Once | INTRAVENOUS | Status: AC | PRN
  Administered 2018-06-25: 50 mL via INTRAVENOUS

## 2018-06-25 MED ORDER — KETOROLAC TROMETHAMINE 30 MG/ML IJ SOLN
30.0000 mg | Freq: Once | INTRAMUSCULAR | Status: AC
  Administered 2018-06-25: 30 mg via INTRAVENOUS
  Filled 2018-06-25: qty 1

## 2018-06-25 MED ORDER — DOXYCYCLINE HYCLATE 100 MG PO CAPS: 100.0000 mg | ORAL_CAPSULE | Freq: Two times a day (BID) | ORAL | 0 refills | Status: DC

## 2018-06-25 MED ORDER — METHOCARBAMOL 500 MG PO TABS: 500.0000 mg | ORAL_TABLET | Freq: Three times a day (TID) | ORAL | 0 refills | Status: DC | PRN

## 2018-06-25 MED ORDER — METHOCARBAMOL 1000 MG/10ML IJ SOLN: 1000.0000 mg | Freq: Once | INTRAMUSCULAR | Status: DC

## 2018-06-25 MED ORDER — HYDROMORPHONE HCL 1 MG/ML IJ SOLN
1.0000 mg | Freq: Once | INTRAMUSCULAR | Status: AC
  Administered 2018-06-25: 1 mg via INTRAVENOUS
  Filled 2018-06-25: qty 1

## 2018-06-25 MED ORDER — IOPAMIDOL (ISOVUE-370) INJECTION 76%
INTRAVENOUS | Status: AC
  Filled 2018-06-25: qty 100

## 2018-06-25 MED ORDER — DIAZEPAM 5 MG PO TABS
5.0000 mg | ORAL_TABLET | Freq: Once | ORAL | Status: AC
  Administered 2018-06-25: 5 mg via ORAL
  Filled 2018-06-25: qty 1

## 2018-06-25 NOTE — ED Notes (Signed)
Patient ambulated to the restroom with stable gait and no complaints of dizziness.

## 2018-06-25 NOTE — ED Triage Notes (Signed)
  Patient comes in with lower back pain and L leg pain that has been going on for a few weeks.  Patient had a surgical procedure for a pinched nerve two months ago.  Patient states the pain has been unbearable for a few days and has been trying different things to help but not working.  Pain 10/10.  Patient A&O x4.

## 2018-06-25 NOTE — ED Provider Notes (Addendum)
[facility] EMERGENCY DEPARTMENT Provider Note   CSN: [id number] Arrival date & time: 06/25/18  0158     History   Chief Complaint Chief Complaint  Patient presents with  . Back Pain  . Leg Pain    HPI Ann Silva is a 72 y.o. female.  Patient with history of chronic back pain status post multiple surgeries on her back and left hip presenting with worsening pain in her back that radiates down her left leg.  This is been ongoing for several weeks.  She had a surgery by Dr. Arnoldo Morale about 2 months ago.  He prescribed her some oxycodone/acetaminophen which she ran out of several days ago.  States the pain has been unbearable at home and she is having difficulty walking.  She called the referral line for her surgeon and was referred to the ED.  She denies any recent fall or new trauma.  Denies any new weakness, numbness, tingling.  No bowel or bladder incontinence.  No fever or vomiting.  No chest pain or shortness of breath.  States she is able to walk at home using a walker with much difficulty.  The history is provided by the patient and the spouse.  Back Pain   Associated symptoms include leg pain. Pertinent negatives include no chest pain, no fever, no headaches, no abdominal pain, no dysuria and no weakness.  Leg Pain      Past Medical History:  Diagnosis Date  . Anemia    takes ferrous sulfate daily  . Anxiety   . Arthritis   . Bladder spasm   . Chronic back pain    spinal stenosis  . Colon cancer ([place])   . Complication of anesthesia    B/P bottoms out, hard to wake up   . Contact dermatitis   . COPD (chronic obstructive pulmonary disease) (HCC)    Albuterol inhaler and DuoNeb as needed  . Coronary artery disease    mild by 08/25/09 cath  . Foot drop, left   . Headache   . Heart murmur    never had any problems  . History of blood transfusion    no abnormal reaction noted  . History of bronchitis   . History of MRSA infection 6+ yrs ago  .  Hypertension    takes Metoprolol daily  . Insomnia    takes Trazodone nightly  . Joint pain   . Joint swelling   . Nocturia   . Panic attacks    takes Effexor daily  . Peripheral edema    takes Lasix daily and takes Bumex every 3 days.Takes Aldactone daily   . Pulmonary emboli ([place]) 2018  . Restless leg syndrome    takes Requip daily  . RLS (restless legs syndrome)   . Sleep apnea    doesnt use CPAP    Patient Active Problem List   Diagnosis Date Noted  . Chronic bilateral low back pain with left-sided sciatica (Primary Area of Pain) 03/12/2018  . Chronic pain of left lower extremity (Secondary Area of Pain) 03/12/2018  . Chronic pain syndrome 03/12/2018  . Long term current use of opiate analgesic 03/12/2018  . Pharmacologic therapy 03/12/2018  . Disorder of skeletal system 03/12/2018  . Problems influencing health status 03/12/2018  . Hyperparathyroidism, primary ([place]) 01/20/2018  . Medicare annual wellness visit, initial 09/02/2017  . Acute saddle pulmonary embolism with acute cor pulmonale ([place]) 02/20/2017  . Dyspnea 12/29/2016  . Acute congestive heart failure ([place])   .  HCAP (healthcare-associated pneumonia)   . Pulmonary embolus ([place])   . Lumbar adjacent segment disease with spondylolisthesis 12/25/2016  . Aortic atherosclerosis ([place]) 08/14/2016  . Lymphedema of left leg 08/05/2016  . B12 deficiency 05/15/2016  . Chronic contact dermatitis 05/01/2016  . History of colon cancer 05/01/2016  . Mild chronic obstructive pulmonary disease ([place]) 05/01/2016  . COPD with acute exacerbation ([place]) 11/27/2015  . COPD exacerbation ([place]) 08/21/2015  . Spinal stenosis of lumbar region 04/02/2015  . Restless legs 02/27/2015  . Hypertension   . Tobacco use disorder 09/12/2014  . Anemia 06/17/2014  . Sedated due to medication 06/17/2014  . Obstructive sleep apnea 06/16/2014  . Benign essential hypertension 06/16/2014  . Restless legs syndrome 06/16/2014  . Colon cancer ([place])    . Depression   . History of total hip replacement, left 10/25/2013  . S/P total hip arthroplasty 10/25/2013    Past Surgical History:  Procedure Laterality Date  . ABDOMINAL HYSTERECTOMY    . BACK SURGERY    . CHOLECYSTECTOMY    . COLON SURGERY     partial colectomy d/t cancer cells  . COLONOSCOPY    . I&D of left hip      x 4  . JOINT REPLACEMENT    . Resection total hip    . Revision of total hip arthroplasty     x3-4  . right hip replacement    . TOTAL HIP ARTHROPLASTY Left      OB History   No obstetric history on file.      Home Medications    Prior to Admission medications   Medication Sig Start Date End Date Taking? Authorizing Provider  albuterol (PROVENTIL HFA;VENTOLIN HFA) 108 (90 Base) MCG/ACT inhaler Inhale 2 puffs into the lungs every 4 (four) hours as needed for wheezing or shortness of breath. 12/05/15   Loletha Grayer, MD  aspirin EC 81 MG tablet Take 81 mg by mouth daily.    [provider]  budesonide-formoterol (SYMBICORT) 160-4.5 MCG/ACT inhaler Inhale 2 puffs into the lungs 2 (two) times daily. Patient taking differently: Inhale 2 puffs into the lungs 2 (two) times daily as needed (for respiratory issues).  12/05/15   Loletha Grayer, MD  cyclobenzaprine (FLEXERIL) 10 MG tablet Take 1 tablet (10 mg total) by mouth 3 (three) times daily as needed for muscle spasms. Patient not taking: Reported on 05/22/2018 04/04/18   Newman Pies, MD  docusate sodium (COLACE) 100 MG capsule Take 1 capsule (100 mg total) by mouth 2 (two) times daily. Patient not taking: Reported on 05/22/2018 04/04/18   Newman Pies, MD  fluticasone [facility]) 50 MCG/ACT nasal spray Place 1 spray into both nostrils daily as needed (for sinus issues).     [provider]  furosemide (LASIX) 20 MG tablet Take 1 tablet (20 mg total) by mouth daily. Patient taking differently: Take 20 mg by mouth 2 (two) times daily.  04/29/16 02/28/26  Lavonia Drafts, MD    gabapentin (NEURONTIN) 100 MG capsule Take 200 mg by mouth at bedtime. 02/15/18   [provider]  ipratropium-albuterol (DUONEB) 0.5-2.5 (3) MG/3ML SOLN Take 3 mLs by nebulization every 6 (six) hours as needed (for wheezing/shortness of breath).    [provider]  methocarbamol (ROBAXIN) 500 MG tablet Take 1-2 tablets (500-1,000 mg total) by mouth 4 (four) times daily as needed for muscle spasms. Patient not taking: Reported on 29/93/7169 6/78/93   Delora Fuel, MD  metoprolol succinate (TOPROL-XL) 50 MG 24 hr tablet Take  1 tablet (50 mg total) by mouth every evening. Take with or immediately following a meal. Patient taking differently: Take 50 mg by mouth at bedtime. Take with or immediately following a meal. 12/26/15   Crissman, Jeannette How, MD  oxyCODONE 10 MG TABS Take 1 tablet (10 mg total) by mouth every 4 (four) hours as needed for severe pain ((score 7 to 10)). Patient not taking: Reported on 05/22/2018 04/04/18   Newman Pies, MD  PARoxetine (PAXIL) 40 MG tablet Take 40 mg by mouth at bedtime.     [provider]  rOPINIRole (REQUIP) 3 MG tablet Take 1 tablet (3 mg total) by mouth 4 (four) times daily. 04/20/15   [name], MD  spironolactone (ALDACTONE) 25 MG tablet Take 25 mg by mouth every evening.    [provider]    Family History Family History  Problem Relation Age of Onset  . Cancer Mother   . Stroke Father   . Diabetes Sister   . Mental illness Sister     Social History Social History   Tobacco Use  . Smoking status: Current Every Day Smoker    Packs/day: 0.75    Years: 51.00    Pack years: 38.25    Types: Cigarettes    Start date: 07/08/1964  . Smokeless tobacco: Never Used  Substance Use Topics  . Alcohol use: No  . Drug use: No     Allergies   Hydrocodone-chlorpheniramine; Sulfa antibiotics; Chlorhexidine; Methadone; Benadryl [diphenhydramine]; Latex; and Tape   Review of Systems Review of Systems   Constitutional: Negative for activity change, appetite change and fever.  HENT: Negative for congestion and rhinorrhea.   Eyes: Positive for visual disturbance.  Respiratory: Negative for cough, chest tightness and shortness of breath.   Cardiovascular: Negative for chest pain.  Gastrointestinal: Negative for abdominal pain, nausea and vomiting.  Genitourinary: Negative for dysuria and hematuria.  Musculoskeletal: Positive for arthralgias, back pain and myalgias.  Neurological: Negative for dizziness, weakness and headaches.   all other systems are negative except as noted in the HPI and PMH.     Physical Exam Updated Vital Signs BP 128/81   Pulse 66   Temp 98.4 F (36.9 C) (Oral)   Resp 18   Ht 5' (1.524 m)   Wt 79.4 kg   SpO2 95%   BMI 34.18 kg/m   Physical Exam Vitals signs and nursing note reviewed.  Constitutional:      General: She is not in acute distress.    Appearance: She is well-developed.     Comments: uncomfortable  HENT:     Head: Normocephalic and atraumatic.     Mouth/Throat:     Pharynx: No oropharyngeal exudate.  Eyes:     Conjunctiva/sclera: Conjunctivae normal.     Pupils: Pupils are equal, round, and reactive to light.  Neck:     Musculoskeletal: Normal range of motion and neck supple.     Comments: No meningismus. Cardiovascular:     Rate and Rhythm: Normal rate and regular rhythm.     Heart sounds: Normal heart sounds. No murmur.  Pulmonary:     Effort: Pulmonary effort is normal. No respiratory distress.     Breath sounds: Normal breath sounds.  Abdominal:     Palpations: Abdomen is soft.     Tenderness: There is no abdominal tenderness. There is no guarding or rebound.  Musculoskeletal:        General: Tenderness present.     Comments: Well-healed lumbar  incision.  There is left SI joint tenderness.  5/5 strength in bilateral lower extremities. Ankle plantar and dorsiflexion intact. Great toe extension intact bilaterally.  Left PT  pulses strong.  Right PT pulses strong.  Right DP pulses strong.  Unable to find left DP pulse.  +1 patellar reflex on L. +2 reflex on R. Antalgic gait   Skin:    General: Skin is warm.     Capillary Refill: Capillary refill takes less than 2 seconds.     Findings: No rash.  Neurological:     Mental Status: She is alert and oriented to person, place, and time.     Cranial Nerves: No cranial nerve deficit.     Motor: No abnormal muscle tone.     Coordination: Coordination normal.     Comments: No ataxia on finger to nose bilaterally. No pronator drift. 5/5 strength throughout. CN 2-12 intact.Equal grip strength. Sensation intact.   Psychiatric:        Behavior: Behavior normal.      ED Treatments / Results  Labs (all labs ordered are listed, but only abnormal results are displayed) Labs Reviewed  BASIC METABOLIC PANEL - Abnormal; Notable for the following components:      Result Value   Glucose, Bld 112 (*)    All other components within normal limits  URINALYSIS, ROUTINE W REFLEX MICROSCOPIC - Abnormal; Notable for the following components:   APPearance CLOUDY (*)    All other components within normal limits  CBC WITH DIFFERENTIAL/PLATELET    EKG None  Radiology Ct Angio Low Extrem Left W &/or Wo Contrast  Result Date: 06/25/2018 CLINICAL DATA:  Low back pain and left leg pain for a few weeks. Nerve surgery 2 months ago. Decreased pulses and claudication symptoms. EXAM: CT ANGIOGRAPHY OF THE left lowerEXTREMITY TECHNIQUE: Multidetector CT imaging of the left lowerwas performed using the standard protocol during bolus administration of intravenous contrast. Multiplanar CT image reconstructions and MIPs were obtained to evaluate the vascular anatomy. CONTRAST:  46mL ISOVUE-370 IOPAMIDOL (ISOVUE-370) INJECTION 76% COMPARISON:  None. FINDINGS: Left pelvis: Left iliac, external iliac, and internal iliac vessels are patent. No aneurysm or dissection. Left femoral, superficial  femoral, deep femoral, and popliteal arteries are patent. No aneurysm, dissection, or significant stenosis. Flow to the tibial trifurcation and runoff vessels is somewhat diminished generally. This could be due to slow flow or artifact from scanning too early. The anterior tibialis artery attenuates shortly after its origin. The peroneal and posterior tibial arteries demonstrate some flow but appear diminutive. Both vessels taper before the ankle. No soft tissue mass or edema demonstrated. No loculated collection or hematoma. No contrast extravasation. Left total hip arthroplasty with cerclage wires. Additional wire fragments demonstrated in the adjacent soft tissues. Components appear otherwise well seated. Mild degenerative changes in the knee and ankle joints. Talar dome is flattened and irregular with mild talar dome depression. This could represent degenerative change, avascular necrosis, or depressed cortical fracture. Review of the MIP images confirms the above findings. IMPRESSION: Runoff to the left knee appears intact. Flow to the tibial trifurcation and calf runoff vessels is somewhat diminished generally. Anterior tibialis flow is limited just distal to the origin with posterior tibial and peroneal flow tapering in the distal calf. This could be due to slow flow, diffuse atherosclerotic disease, or artifact from scanning too early. Postoperative left hip arthroplasty. Degenerative changes in the left hip and left ankle. Irregularity and depression of the talar dome. Electronically Signed   By:  Lucienne Capers M.D.   On: 06/25/2018 05:48   Ct Angio Chest/abd/pel For Dissection W And/or Wo Contrast  Result Date: 06/25/2018 CLINICAL DATA:  Low back and left leg pain for a few weeks. Pinched nerve surgery 2 months ago. EXAM: CT ANGIOGRAPHY CHEST, ABDOMEN AND PELVIS TECHNIQUE: Multidetector CT imaging through the chest, abdomen and pelvis was performed using the standard protocol during bolus  administration of intravenous contrast. Multiplanar reconstructed images and MIPs were obtained and reviewed to evaluate the vascular anatomy. CONTRAST:  3mL ISOVUE-370 IOPAMIDOL (ISOVUE-370) INJECTION 76% COMPARISON:  None. FINDINGS: CTA CHEST FINDINGS Cardiovascular: Noncontrast CT images of the chest demonstrate scattered aortic calcifications. No evidence of intramural hematoma. Images obtained during arterial phase after administration of intravenous contrast material demonstrate normal caliber thoracic aorta. No aortic dissection. Great vessel origins are patent. Central pulmonary arteries are well opacified without evidence of significant pulmonary embolus. Normal heart size. No pericardial effusions. Mediastinum/Nodes: No enlarged mediastinal, hilar, or axillary lymph nodes. Thyroid gland, trachea, and esophagus demonstrate no significant findings. Lungs/Pleura: Patchy airspace disease in the lungs bilaterally may represent edema or multifocal pneumonia. Scattered emphysematous changes in the lungs. No pleural effusions. No pneumothorax. Airways are patent. Musculoskeletal: Mild degenerative changes in the thoracic spine. No acute fractures identified. Review of the MIP images confirms the above findings. CTA ABDOMEN AND PELVIS FINDINGS VASCULAR Aorta: Normal caliber aorta without aneurysm, dissection, vasculitis or significant stenosis. Scattered aortic calcifications. Celiac: Patent without evidence of aneurysm, dissection, vasculitis or significant stenosis. SMA: Patent without evidence of aneurysm, dissection, vasculitis or significant stenosis. Renals: Both renal arteries are patent without evidence of aneurysm, dissection, vasculitis, fibromuscular dysplasia or significant stenosis. IMA: Patent without evidence of aneurysm, dissection, vasculitis or significant stenosis. Inflow: Patent without evidence of aneurysm, dissection, vasculitis or significant stenosis. Veins: No obvious venous abnormality  within the limitations of this arterial phase study. Review of the MIP images confirms the above findings. NON-VASCULAR Hepatobiliary: No focal liver abnormality is seen. Status post cholecystectomy. No biliary dilatation. Pancreas: Unremarkable. No pancreatic ductal dilatation or surrounding inflammatory changes. Spleen: Normal in size without focal abnormality. Adrenals/Urinary Tract: No adrenal gland nodules. Bilateral renal cysts. 12 mm exophytic lesion off of the upper pole of the right kidney medially demonstrates higher density and could represent hemorrhagic cyst or solid tumor. Consider ultrasound for further characterization. No hydronephrosis or hydroureter. Bladder is unremarkable. Stomach/Bowel: Stomach, small bowel, and colon are not abnormally distended. Postoperative changes with partial right hemicolectomy and ileocolonic anastomosis. Lymphatic: No significant lymphadenopathy. Reproductive: Status post hysterectomy. No adnexal masses. Other: No free air or free fluid in the abdomen. Abdominal wall musculature appears intact. Postoperative scarring and small soft tissue fluid collection dorsal to the lumbar spine. Musculoskeletal: Postoperative changes with posterior fixation of the lower lumbar spine and lower lumbar laminectomies. Bilateral total hip arthroplasties. No destructive bone lesions. Review of the MIP images confirms the above findings. IMPRESSION: 1. No evidence of aneurysm or dissection in the thoracic or abdominal aorta. No evidence of significant pulmonary embolus. 2. Patchy airspace disease in the lungs bilaterally may represent edema or multifocal pneumonia. 3. Bilateral renal cysts. 12 mm exophytic lesion off of the upper pole right kidney may represent hemorrhagic cyst or solid tumor. Consider ultrasound for further characterization. 4. Postoperative changes with posterior fixation of the lower lumbar spine and lower lumbar laminectomies. Electronically Signed   By: Lucienne Capers M.D.   On: 06/25/2018 05:19    Procedures Procedures (including critical care time)  Medications  Ordered in ED Medications  HYDROmorphone (DILAUDID) injection 1 mg (1 mg Intramuscular Given 06/25/18 0239)  diazepam (VALIUM) tablet 5 mg (5 mg Oral Given 06/25/18 0237)     Initial Impression / Assessment and Plan / ED Course  I have reviewed the triage vital signs and the nursing notes.  Pertinent labs & imaging results that were available during my care of the patient were reviewed by me and considered in my medical decision making (see chart for details).    Acute on chronic left-sided low back pain with radiculopathy.  No fevers.  No new weakness, numbness or tingling.  No incontinence.  Low suspicion for cord compression or cauda equina.  [facility] narcotic database reviewed.  Patient has received 24 prescriptions from 10 different prescribers in the past year.  Most recently she received 30 oxycodone/acetaminophen tablets on December 11.  Does have asymmetric pulses on exam but feet are warm and well perfused.  CT obtained for further evaluation.  CT findings reviewed with Dr. Trula Slade. Patient with palpable PT pulse. Diminished flow on CTA likely due to timing and Dr. Trula Slade feels with palpable PT pulse, patient does not have vascular issue.   Patient's pain has improved on reassessment and she is able to ambulate. CT findings discussed with her including need for ultrasound of kidneys to further assess possible cyst. Lung findings noted. Patient denies any fever or cough. Does have COPD history. Will cover with antibiotics.  Surgical changes of back and hip look stable.   No indication for emergent MRI today. This was offered for patient's convenience but she declines. Advised to call Dr. Arnoldo Morale today for followup. Patient prefers not to have a course of steroids as she dislikes the side effects. Return to the ED with worsening pain, weakness, numbness,  incontinence, fever, or any other concerns.  Final Clinical Impressions(s) / ED Diagnoses   Final diagnoses:  Acute left-sided low back pain with left-sided sciatica    ED Discharge Orders    None       Karnell Vanderloop, Annie Main, MD 06/25/18 0626    Ezequiel Essex, MD 06/25/18 2203

## 2018-06-25 NOTE — Discharge Instructions (Addendum)
Call Dr. Arnoldo Morale today for a follow-up appointment. Follow up for an ultrasound of your kidneys with your primary doctor. Take the antibiotics for possible pneumonia. Take the anti-inflammatories and muscle relaxers as prescribed.  Return to the ED with new weakness, numbness, tingling, fever, incontinence or any other concerns.

## 2018-07-16 ENCOUNTER — Ambulatory Visit: Payer: Medicare HMO

## 2018-07-24 ENCOUNTER — Other Ambulatory Visit (HOSPITAL_COMMUNITY): Payer: Self-pay | Admitting: Family Medicine

## 2018-07-24 ENCOUNTER — Other Ambulatory Visit: Payer: Self-pay | Admitting: Family Medicine

## 2018-07-24 DIAGNOSIS — N281 Cyst of kidney, acquired: Secondary | ICD-10-CM

## 2018-07-29 ENCOUNTER — Other Ambulatory Visit (HOSPITAL_COMMUNITY): Payer: Self-pay | Admitting: Urology

## 2018-07-29 DIAGNOSIS — N281 Cyst of kidney, acquired: Secondary | ICD-10-CM

## 2018-08-07 ENCOUNTER — Ambulatory Visit
Admission: RE | Admit: 2018-08-07 | Discharge: 2018-08-07 | Disposition: A | Discharge status: Home / Self Care | Payer: Medicare HMO | Source: Ambulatory Visit | Attending: Family Medicine | Admitting: Family Medicine

## 2018-08-07 DIAGNOSIS — N281 Cyst of kidney, acquired: Secondary | ICD-10-CM | POA: Insufficient documentation

## 2018-08-07 LAB — POCT I-STAT CREATININE: Creatinine, Ser: 0.8 mg/dL (ref 0.44–1.00)

## 2018-08-07 MED ORDER — GADOBUTROL 1 MMOL/ML IV SOLN
7.0000 mL | Freq: Once | INTRAVENOUS | Status: AC | PRN
  Administered 2018-08-07: 7 mL via INTRAVENOUS

## 2018-10-11 ENCOUNTER — Emergency Department
Admission: EM | Admit: 2018-10-11 | Discharge: 2018-10-11 | Disposition: A | Discharge status: Home / Self Care | Payer: Medicare HMO | Attending: Emergency Medicine | Admitting: Emergency Medicine

## 2018-10-11 ENCOUNTER — Other Ambulatory Visit: Payer: Self-pay

## 2018-10-11 ENCOUNTER — Emergency Department: Payer: Medicare HMO

## 2018-10-11 DIAGNOSIS — Y929 Unspecified place or not applicable: Secondary | ICD-10-CM | POA: Diagnosis not present

## 2018-10-11 DIAGNOSIS — Z79899 Other long term (current) drug therapy: Secondary | ICD-10-CM | POA: Insufficient documentation

## 2018-10-11 DIAGNOSIS — Y999 Unspecified external cause status: Secondary | ICD-10-CM | POA: Diagnosis not present

## 2018-10-11 DIAGNOSIS — Z96643 Presence of artificial hip joint, bilateral: Secondary | ICD-10-CM | POA: Diagnosis not present

## 2018-10-11 DIAGNOSIS — W06XXXA Fall from bed, initial encounter: Secondary | ICD-10-CM | POA: Insufficient documentation

## 2018-10-11 DIAGNOSIS — Z7982 Long term (current) use of aspirin: Secondary | ICD-10-CM | POA: Insufficient documentation

## 2018-10-11 DIAGNOSIS — Z91040 Latex allergy status: Secondary | ICD-10-CM | POA: Insufficient documentation

## 2018-10-11 DIAGNOSIS — F17210 Nicotine dependence, cigarettes, uncomplicated: Secondary | ICD-10-CM | POA: Insufficient documentation

## 2018-10-11 DIAGNOSIS — J449 Chronic obstructive pulmonary disease, unspecified: Secondary | ICD-10-CM | POA: Insufficient documentation

## 2018-10-11 DIAGNOSIS — R55 Syncope and collapse: Secondary | ICD-10-CM

## 2018-10-11 DIAGNOSIS — Y9389 Activity, other specified: Secondary | ICD-10-CM | POA: Diagnosis not present

## 2018-10-11 DIAGNOSIS — I10 Essential (primary) hypertension: Secondary | ICD-10-CM | POA: Insufficient documentation

## 2018-10-11 DIAGNOSIS — S060X0A Concussion without loss of consciousness, initial encounter: Secondary | ICD-10-CM | POA: Diagnosis not present

## 2018-10-11 LAB — URINALYSIS, ROUTINE W REFLEX MICROSCOPIC
Bacteria, UA: NONE SEEN
Bilirubin Urine: NEGATIVE
Glucose, UA: NEGATIVE mg/dL
Ketones, ur: NEGATIVE mg/dL
Leukocytes,Ua: NEGATIVE
Nitrite: NEGATIVE
Protein, ur: NEGATIVE mg/dL
Specific Gravity, Urine: 1.012 (ref 1.005–1.030)
pH: 5 (ref 5.0–8.0)

## 2018-10-11 LAB — BASIC METABOLIC PANEL
Anion gap: 7 (ref 5–15)
BUN: 27 mg/dL — ABNORMAL HIGH (ref 8–23)
CO2: 27 mmol/L (ref 22–32)
Calcium: 9.8 mg/dL (ref 8.9–10.3)
Chloride: 107 mmol/L (ref 98–111)
Creatinine, Ser: 0.79 mg/dL (ref 0.44–1.00)
GFR calc Af Amer: >60 mL/min (ref >60)
GFR calc non Af Amer: >60 mL/min (ref >60)
Glucose, Bld: 97 mg/dL (ref 70–99)
Potassium: 4 mmol/L (ref 3.5–5.1)
Sodium: 141 mmol/L (ref 135–145)

## 2018-10-11 LAB — CBC
HCT: 41.3 % (ref 36.0–46.0)
Hemoglobin: 13.5 g/dL (ref 12.0–15.0)
MCH: 31.7 pg (ref 26.0–34.0)
MCHC: 32.7 g/dL (ref 30.0–36.0)
MCV: 96.9 fL (ref 80.0–100.0)
Platelets: 205 10*3/uL (ref 150–400)
RBC: 4.26 MIL/uL (ref 3.87–5.11)
RDW: 14.2 % (ref 11.5–15.5)
WBC: 7.6 10*3/uL (ref 4.0–10.5)
nRBC: 0 % (ref 0.0–0.2)

## 2018-10-11 LAB — TROPONIN I: Troponin I: <0.03 ng/mL (ref <0.03)

## 2018-10-11 MED ORDER — SODIUM CHLORIDE 0.9 % IV BOLUS
500.0000 mL | Freq: Once | INTRAVENOUS | Status: AC
  Administered 2018-10-11: 500 mL via INTRAVENOUS

## 2018-10-11 MED ORDER — PERMETHRIN 5 % EX CREA: 1.0000 "application " | TOPICAL_CREAM | Freq: Once | CUTANEOUS | 0 refills | Status: AC

## 2018-10-11 NOTE — ED Triage Notes (Addendum)
Reports fell asleep on the edge of the bed and fell off.  Reports standing at counter and fell asleep and fell again.  Patient reports her head doesn't feel right and her "kidneys" hurt.  Patient denies any type of blood thinner and reports fall was 4 days ago.

## 2018-10-11 NOTE — Discharge Instructions (Signed)
Use the permethrin cream as prescribed, leaving it on for 8 to 14 hours before washing it off.  You can repeat it after 14 days if you continue to have symptoms.  Follow-up with your regular doctor in 1 to 2 weeks.  Return to the ER for new, worsening, or persistent weakness, lightheadedness, severe headaches, vomiting, or any other new or worsening symptoms that concern you.

## 2018-10-11 NOTE — ED Provider Notes (Signed)
[facility] Emergency Department Provider Note ____________________________________________   First MD Initiated Contact with Patient 10/11/18 2018     (approximate)  I have reviewed the triage vital signs and the nursing notes.   HISTORY  Chief Complaint Fall    HPI Ann Silva is a 73 y.o. female with PMH as noted below who presents 2 episodes of apparent syncope 4 days ago where she felt like she "fell asleep" suddenly causing her to fall and hit her head.  She states she might have felt a little bit lightheaded before this happened.  Since the falls, the patient reports some fatigue, persistent lightheadedness, and headaches.  She denies fever, nausea or vomiting, or any urinary symptoms although she does have pain in her right flank where she feels like she hit her kidney.  In addition, the patient has areas to her left forearm where she has scratched.  She believes that she had bites from an insect which is burrowing into her skin.  Past Medical History:  Diagnosis Date  . Anemia    takes ferrous sulfate daily  . Anxiety   . Arthritis   . Bladder spasm   . Chronic back pain    spinal stenosis  . Colon cancer ([place])   . Complication of anesthesia    B/P bottoms out, hard to wake up   . Contact dermatitis   . COPD (chronic obstructive pulmonary disease) (HCC)    Albuterol inhaler and DuoNeb as needed  . Coronary artery disease    mild by 08/25/09 cath  . Foot drop, left   . Headache   . Heart murmur    never had any problems  . History of blood transfusion    no abnormal reaction noted  . History of bronchitis   . History of MRSA infection 6+ yrs ago  . Hypertension    takes Metoprolol daily  . Insomnia    takes Trazodone nightly  . Joint pain   . Joint swelling   . Nocturia   . Panic attacks    takes Effexor daily  . Peripheral edema    takes Lasix daily and takes Bumex every 3 days.Takes Aldactone daily   . Pulmonary emboli  ([place]) 2018  . Restless leg syndrome    takes Requip daily  . RLS (restless legs syndrome)   . Sleep apnea    doesnt use CPAP    Patient Active Problem List   Diagnosis Date Noted  . Chronic bilateral low back pain with left-sided sciatica (Primary Area of Pain) 03/12/2018  . Chronic pain of left lower extremity (Secondary Area of Pain) 03/12/2018  . Chronic pain syndrome 03/12/2018  . Long term current use of opiate analgesic 03/12/2018  . Pharmacologic therapy 03/12/2018  . Disorder of skeletal system 03/12/2018  . Problems influencing health status 03/12/2018  . Hyperparathyroidism, primary ([place]) 01/20/2018  . Medicare annual wellness visit, initial 09/02/2017  . Acute saddle pulmonary embolism with acute cor pulmonale ([place]) 02/20/2017  . Dyspnea 12/29/2016  . Acute congestive heart failure ([place])   . HCAP (healthcare-associated pneumonia)   . Pulmonary embolus ([place])   . Lumbar adjacent segment disease with spondylolisthesis 12/25/2016  . Aortic atherosclerosis ([place]) 08/14/2016  . Lymphedema of left leg 08/05/2016  . B12 deficiency 05/15/2016  . Chronic contact dermatitis 05/01/2016  . History of colon cancer 05/01/2016  . Mild chronic obstructive pulmonary disease ([place]) 05/01/2016  . COPD with acute exacerbation ([place]) 11/27/2015  . COPD exacerbation (  [place]) 08/21/2015  . Spinal stenosis of lumbar region 04/02/2015  . Restless legs 02/27/2015  . Hypertension   . Tobacco use disorder 09/12/2014  . Anemia 06/17/2014  . Sedated due to medication 06/17/2014  . Obstructive sleep apnea 06/16/2014  . Benign essential hypertension 06/16/2014  . Restless legs syndrome 06/16/2014  . Colon cancer ([place])   . Depression   . History of total hip replacement, left 10/25/2013  . S/P total hip arthroplasty 10/25/2013    Past Surgical History:  Procedure Laterality Date  . ABDOMINAL HYSTERECTOMY    . BACK SURGERY    . CHOLECYSTECTOMY    . COLON SURGERY     partial colectomy d/t  cancer cells  . COLONOSCOPY    . I&D of left hip      x 4  . JOINT REPLACEMENT    . Resection total hip    . Revision of total hip arthroplasty     x3-4  . right hip replacement    . TOTAL HIP ARTHROPLASTY Left     Prior to Admission medications   Medication Sig Start Date End Date Taking? Authorizing Provider  albuterol (PROVENTIL HFA;VENTOLIN HFA) 108 (90 Base) MCG/ACT inhaler Inhale 2 puffs into the lungs every 4 (four) hours as needed for wheezing or shortness of breath. 12/05/15   Loletha Grayer, MD  aspirin EC 81 MG tablet Take 81 mg by mouth daily.    [provider]  budesonide-formoterol (SYMBICORT) 160-4.5 MCG/ACT inhaler Inhale 2 puffs into the lungs 2 (two) times daily. Patient taking differently: Inhale 2 puffs into the lungs 2 (two) times daily as needed (for respiratory issues).  12/05/15   Loletha Grayer, MD  cyclobenzaprine (FLEXERIL) 10 MG tablet Take 1 tablet (10 mg total) by mouth 3 (three) times daily as needed for muscle spasms. Patient not taking: Reported on 05/22/2018 04/04/18   Newman Pies, MD  docusate sodium (COLACE) 100 MG capsule Take 1 capsule (100 mg total) by mouth 2 (two) times daily. Patient not taking: Reported on 05/22/2018 04/04/18   Newman Pies, MD  doxycycline (VIBRAMYCIN) 100 MG capsule Take 1 capsule (100 mg total) by mouth 2 (two) times daily. 06/25/18   Rancour, Annie Main, MD  fluticasone (FLONASE) 50 MCG/ACT nasal spray Place 1 spray into both nostrils daily as needed (for sinus issues).     [provider]  furosemide (LASIX) 20 MG tablet Take 1 tablet (20 mg total) by mouth daily. Patient taking differently: Take 20 mg by mouth 2 (two) times daily.  04/29/16 02/28/26  Lavonia Drafts, MD  gabapentin (NEURONTIN) 100 MG capsule Take 200 mg by mouth at bedtime. 02/15/18   [provider]  ipratropium-albuterol (DUONEB) 0.5-2.5 (3) MG/3ML SOLN Take 3 mLs by nebulization every 6 (six) hours as needed (for  wheezing/shortness of breath).    [provider]  methocarbamol (ROBAXIN) 500 MG tablet Take 1 tablet (500 mg total) by mouth every 8 (eight) hours as needed for muscle spasms. 06/25/18   Rancour, Annie Main, MD  metoprolol succinate (TOPROL-XL) 50 MG 24 hr tablet Take 1 tablet (50 mg total) by mouth every evening. Take with or immediately following a meal. Patient taking differently: Take 50 mg by mouth at bedtime. Take with or immediately following a meal. 12/26/15   Crissman, Jeannette How, MD  naproxen (NAPROSYN) 375 MG tablet Take 1 tablet (375 mg total) by mouth 2 (two) times daily with a meal. 06/25/18   Rancour, Annie Main, MD  oxyCODONE 10 MG TABS Take 1  tablet (10 mg total) by mouth every 4 (four) hours as needed for severe pain ((score 7 to 10)). Patient not taking: Reported on 05/22/2018 04/04/18   Newman Pies, MD  PARoxetine (PAXIL) 40 MG tablet Take 40 mg by mouth at bedtime.     [provider]  permethrin (ELIMITE) 5 % cream Apply 1 application topically once for 1 dose. Apply from head to soles of feet.  Thoroughly massage into skin.  Leave on for 8 to 14 hours before washing off with water. 10/11/18 10/11/18  Arta Silence, MD  rOPINIRole (REQUIP) 3 MG tablet Take 1 tablet (3 mg total) by mouth 4 (four) times daily. 04/20/15   [name], MD  spironolactone (ALDACTONE) 25 MG tablet Take 25 mg by mouth every evening.    [provider]    Allergies Hydrocodone-chlorpheniramine; Sulfa antibiotics; Chlorhexidine; Methadone; Benadryl [diphenhydramine]; Latex; and Tape  Family History  Problem Relation Age of Onset  . Cancer Mother   . Stroke Father   . Diabetes Sister   . Mental illness Sister     Social History Social History   Tobacco Use  . Smoking status: Current Every Day Smoker    Packs/day: 0.75    Years: 51.00    Pack years: 38.25    Types: Cigarettes    Start date: 07/08/1964  . Smokeless tobacco: Never Used  Substance Use Topics  .  Alcohol use: No  . Drug use: No    Review of Systems  Constitutional: No fever. Eyes: No visual changes. ENT: No neck pain. Cardiovascular: Denies chest pain. Respiratory: Denies shortness of breath. Gastrointestinal: No vomiting or diarrhea.  Genitourinary: Negative for dysuria or hematuria.  Musculoskeletal: Negative for back pain. Skin: Positive for excoriation. Neurological: Positive for headaches, negative for focal weakness or numbness.   ____________________________________________   PHYSICAL EXAM:  VITAL SIGNS: ED Triage Vitals  Enc Vitals Group     BP 10/11/18 1956 130/84     Pulse Rate 10/11/18 1956 78     Resp 10/11/18 1956 18     Temp 10/11/18 1956 97.8 F (36.6 C)     Temp Source 10/11/18 1956 Oral     SpO2 10/11/18 1956 97 %     Weight 10/11/18 1957 174 lb (78.9 kg)     Height 10/11/18 1957 4\' 11"  (1.499 m)     Head Circumference --      Peak Flow --      Pain Score --      Pain Loc --      Pain Edu? --      Excl. in [place]? --     Constitutional: Alert and oriented. Well appearing for age and in no acute distress. Eyes: Conjunctivae are normal.  EOMI.  PERRLA. Head: Atraumatic. Nose: No congestion/rhinnorhea. Mouth/Throat: Mucous membranes are moist.   Neck: Normal range of motion.  Cardiovascular: Normal rate, regular rhythm. Good peripheral circulation. Respiratory: Normal respiratory effort.  No retractions.  Gastrointestinal: Soft and nontender. No distention.  Genitourinary: No CVA or flank tenderness. Musculoskeletal: No lower extremity edema.  Extremities warm and well perfused.  Neurologic:  Normal speech and language.  Motor intact in all extremities.  Normal coordination.  No gross focal neurologic deficits are appreciated.  Skin:  Skin is warm and dry.  A few few excoriations noted to the left forearm which are healing appropriately.  No visible burrows or insects. Psychiatric: Mood and affect are normal. Speech and behavior are normal.   ____________________________________________  LABS (all labs ordered are listed, but only abnormal results are displayed)  Labs Reviewed  URINALYSIS, ROUTINE W REFLEX MICROSCOPIC - Abnormal; Notable for the following components:      Result Value   Color, Urine YELLOW (*)    APPearance CLEAR (*)    Hgb urine dipstick SMALL (*)    All other components within normal limits  BASIC METABOLIC PANEL - Abnormal; Notable for the following components:   BUN 27 (*)    All other components within normal limits  CBC  TROPONIN I   ____________________________________________  EKG  ED ECG REPORT I, Arta Silence, the attending physician, personally viewed and interpreted this ECG.  Date: 10/11/2018 EKG Time: 2051 Rate: 64 Rhythm: normal sinus rhythm QRS Axis: normal Intervals: normal ST/T Wave abnormalities: normal Narrative Interpretation: no evidence of acute ischemia  ____________________________________________  RADIOLOGY  CT head: No ICH or other acute abnormality  ____________________________________________   PROCEDURES  Procedure(s) performed: No  Procedures  Critical Care performed: No ____________________________________________   INITIAL IMPRESSION / ASSESSMENT AND PLAN / ED COURSE  Pertinent labs & imaging results that were available during my care of the patient were reviewed by me and considered in my medical decision making (see chart for details).  73 year old female with PMH as noted above presents with persistent headache and lightheadedness as well as some fatigue after 2 falls 4 days ago in which she hit her head.  The patient states that she "fell asleep" suddenly and his description sounds like syncope.  She has not had further syncope or near syncope since that time.  In addition the patient reports some excoriations to her left forearm and states that she believes she was bitten by a type of bug while she was walking outside.  I reviewed  the past medical records in [place].  The patient has had a few prior ED visits for unrelated complaints although she did have a visit in October 2019 in which she fell asleep suddenly and was evaluated for altered mental status with negative work-up.  On exam the patient is relatively well-appearing.  Her neuro exam is nonfocal.  The remainder of the exam is unremarkable except for the left forearm excoriations.  Overall I suspect most likely syncope or near syncope causing fall.  The patient's symptoms since then are consistent with a mild concussion.  Differential would also include dehydration, electrolyte abnormality, infection, or less likely cardiac etiology.  We will obtain CT head, lab work-up, give a fluid bolus, and reassess.  ----------------------------------------- 11:25 PM on 10/11/2018 -----------------------------------------  CT head and labs are all unremarkable.  The patient is feeling well after the fluids.  I counseled her on the results of the work-up.  She is stable for discharge at this time.  The patient named the type of insect that she believes caused the bites on her left forearm although I did not recognize the name and there was no results when I searched Google.  In any case, there is no evidence of any insect or burrowing at this time.  It is possible that she could have scabies, or some other type of insect bite with mild localized reaction.  I will prescribe permethrin.  Return precautions given, the patient expresses understanding.  ____________________________________________   FINAL CLINICAL IMPRESSION(S) / ED DIAGNOSES  Final diagnoses:  Syncope, unspecified syncope type  Concussion without loss of consciousness, initial encounter      NEW MEDICATIONS STARTED DURING THIS VISIT:  Discharge Medication List as of  10/11/2018 10:51 PM    START taking these medications   Details  permethrin (ELIMITE) 5 % cream Apply 1 application topically once for 1  dose. Apply from head to soles of feet.  Thoroughly massage into skin.  Leave on for 8 to 14 hours before washing off with water., Starting Sun 10/11/2018, Normal         Note:  This document was prepared using Dragon voice recognition software and may include unintentional dictation errors.    Arta Silence, MD 10/11/18 [phone]

## 2018-10-26 ENCOUNTER — Emergency Department
Admission: EM | Admit: 2018-10-26 | Discharge: 2018-10-26 | Disposition: A | Discharge status: Home / Self Care | Payer: Medicare HMO | Attending: Emergency Medicine | Admitting: Emergency Medicine

## 2018-10-26 ENCOUNTER — Other Ambulatory Visit: Payer: Self-pay

## 2018-10-26 DIAGNOSIS — I2510 Atherosclerotic heart disease of native coronary artery without angina pectoris: Secondary | ICD-10-CM | POA: Diagnosis not present

## 2018-10-26 DIAGNOSIS — F17210 Nicotine dependence, cigarettes, uncomplicated: Secondary | ICD-10-CM | POA: Insufficient documentation

## 2018-10-26 DIAGNOSIS — Z7982 Long term (current) use of aspirin: Secondary | ICD-10-CM | POA: Diagnosis not present

## 2018-10-26 DIAGNOSIS — Z79899 Other long term (current) drug therapy: Secondary | ICD-10-CM | POA: Insufficient documentation

## 2018-10-26 DIAGNOSIS — I10 Essential (primary) hypertension: Secondary | ICD-10-CM | POA: Diagnosis not present

## 2018-10-26 DIAGNOSIS — H9202 Otalgia, left ear: Secondary | ICD-10-CM | POA: Diagnosis present

## 2018-10-26 MED ORDER — HYDROCORTISONE-ACETIC ACID 1-2 % OT SOLN
4.0000 [drp] | Freq: Four times a day (QID) | OTIC | Status: DC
  Administered 2018-10-26: 4 [drp] via OTIC
  Filled 2018-10-26: qty 10

## 2018-10-26 NOTE — ED Triage Notes (Signed)
Pt states "there is a parasite in my ear". Pt states "it has buggy eyes and a face like an alligator". Pt states "it lives in the trees and laid eggs in my ear". No obvious insect noted in ear canal on external exam. No drainage noted from ear canal. Pt does have a small scab noted to inner part of ear. Pt states she believes insect left a stinger in her ear.

## 2018-10-26 NOTE — ED Provider Notes (Signed)
[facility] Emergency Department Provider Note   ____________________________________________   First MD Initiated Contact with Patient 10/26/18 0111     (approximate)  I have reviewed the triage vital signs and the nursing notes.   HISTORY  Chief Complaint Otalgia   HPI Ann Silva is a 73 y.o. female who is convinced she has parasites trying to get in her left ear.  She has a scratch on the outer part of the ear.  And also one in the external part of the canal.  Neither one looks infected.  There is some scabbing present.  I have told her there are no parasites try to get in her ears at the present time.  I will give her some Corticosporin eardrops to try to keep anything from getting infected and also she can use them to keep any insects from trying to get in her ears.         Past Medical History:  Diagnosis Date  . Anemia    takes ferrous sulfate daily  . Anxiety   . Arthritis   . Bladder spasm   . Chronic back pain    spinal stenosis  . Colon cancer ([place])   . Complication of anesthesia    B/P bottoms out, hard to wake up   . Contact dermatitis   . COPD (chronic obstructive pulmonary disease) (HCC)    Albuterol inhaler and DuoNeb as needed  . Coronary artery disease    mild by 08/25/09 cath  . Foot drop, left   . Headache   . Heart murmur    never had any problems  . History of blood transfusion    no abnormal reaction noted  . History of bronchitis   . History of MRSA infection 6+ yrs ago  . Hypertension    takes Metoprolol daily  . Insomnia    takes Trazodone nightly  . Joint pain   . Joint swelling   . Nocturia   . Panic attacks    takes Effexor daily  . Peripheral edema    takes Lasix daily and takes Bumex every 3 days.Takes Aldactone daily   . Pulmonary emboli ([place]) 2018  . Restless leg syndrome    takes Requip daily  . RLS (restless legs syndrome)   . Sleep apnea    doesnt use CPAP    Patient Active Problem  List   Diagnosis Date Noted  . Chronic bilateral low back pain with left-sided sciatica (Primary Area of Pain) 03/12/2018  . Chronic pain of left lower extremity (Secondary Area of Pain) 03/12/2018  . Chronic pain syndrome 03/12/2018  . Long term current use of opiate analgesic 03/12/2018  . Pharmacologic therapy 03/12/2018  . Disorder of skeletal system 03/12/2018  . Problems influencing health status 03/12/2018  . Hyperparathyroidism, primary ([place]) 01/20/2018  . Medicare annual wellness visit, initial 09/02/2017  . Acute saddle pulmonary embolism with acute cor pulmonale ([place]) 02/20/2017  . Dyspnea 12/29/2016  . Acute congestive heart failure ([place])   . HCAP (healthcare-associated pneumonia)   . Pulmonary embolus ([place])   . Lumbar adjacent segment disease with spondylolisthesis 12/25/2016  . Aortic atherosclerosis ([place]) 08/14/2016  . Lymphedema of left leg 08/05/2016  . B12 deficiency 05/15/2016  . Chronic contact dermatitis 05/01/2016  . History of colon cancer 05/01/2016  . Mild chronic obstructive pulmonary disease ([place]) 05/01/2016  . COPD with acute exacerbation ([place]) 11/27/2015  . COPD exacerbation ([place]) 08/21/2015  . Spinal stenosis of lumbar region 04/02/2015  .  Restless legs 02/27/2015  . Hypertension   . Tobacco use disorder 09/12/2014  . Anemia 06/17/2014  . Sedated due to medication 06/17/2014  . Obstructive sleep apnea 06/16/2014  . Benign essential hypertension 06/16/2014  . Restless legs syndrome 06/16/2014  . Colon cancer ([place])   . Depression   . History of total hip replacement, left 10/25/2013  . S/P total hip arthroplasty 10/25/2013    Past Surgical History:  Procedure Laterality Date  . ABDOMINAL HYSTERECTOMY    . BACK SURGERY    . CHOLECYSTECTOMY    . COLON SURGERY     partial colectomy d/t cancer cells  . COLONOSCOPY    . I&D of left hip      x 4  . JOINT REPLACEMENT    . Resection total hip    . Revision of total hip arthroplasty     x3-4  .  right hip replacement    . TOTAL HIP ARTHROPLASTY Left     Prior to Admission medications   Medication Sig Start Date End Date Taking? Authorizing Provider  albuterol (PROVENTIL HFA;VENTOLIN HFA) 108 (90 Base) MCG/ACT inhaler Inhale 2 puffs into the lungs every 4 (four) hours as needed for wheezing or shortness of breath. 12/05/15   Loletha Grayer, MD  aspirin EC 81 MG tablet Take 81 mg by mouth daily.    [provider]  budesonide-formoterol (SYMBICORT) 160-4.5 MCG/ACT inhaler Inhale 2 puffs into the lungs 2 (two) times daily. Patient taking differently: Inhale 2 puffs into the lungs 2 (two) times daily as needed (for respiratory issues).  12/05/15   Loletha Grayer, MD  cyclobenzaprine (FLEXERIL) 10 MG tablet Take 1 tablet (10 mg total) by mouth 3 (three) times daily as needed for muscle spasms. Patient not taking: Reported on 05/22/2018 04/04/18   Newman Pies, MD  docusate sodium (COLACE) 100 MG capsule Take 1 capsule (100 mg total) by mouth 2 (two) times daily. Patient not taking: Reported on 05/22/2018 04/04/18   Newman Pies, MD  doxycycline (VIBRAMYCIN) 100 MG capsule Take 1 capsule (100 mg total) by mouth 2 (two) times daily. 06/25/18   Rancour, Annie Main, MD  fluticasone (FLONASE) 50 MCG/ACT nasal spray Place 1 spray into both nostrils daily as needed (for sinus issues).     [provider]  furosemide (LASIX) 20 MG tablet Take 1 tablet (20 mg total) by mouth daily. Patient taking differently: Take 20 mg by mouth 2 (two) times daily.  04/29/16 02/28/26  Lavonia Drafts, MD  gabapentin (NEURONTIN) 100 MG capsule Take 200 mg by mouth at bedtime. 02/15/18   [provider]  ipratropium-albuterol (DUONEB) 0.5-2.5 (3) MG/3ML SOLN Take 3 mLs by nebulization every 6 (six) hours as needed (for wheezing/shortness of breath).    [provider]  methocarbamol (ROBAXIN) 500 MG tablet Take 1 tablet (500 mg total) by mouth every 8 (eight) hours as needed for  muscle spasms. 06/25/18   Rancour, Annie Main, MD  metoprolol succinate (TOPROL-XL) 50 MG 24 hr tablet Take 1 tablet (50 mg total) by mouth every evening. Take with or immediately following a meal. Patient taking differently: Take 50 mg by mouth at bedtime. Take with or immediately following a meal. 12/26/15   Crissman, Jeannette How, MD  naproxen (NAPROSYN) 375 MG tablet Take 1 tablet (375 mg total) by mouth 2 (two) times daily with a meal. 06/25/18   Rancour, Annie Main, MD  oxyCODONE 10 MG TABS Take 1 tablet (10 mg total) by mouth every 4 (four) hours as needed  for severe pain ((score 7 to 10)). Patient not taking: Reported on 05/22/2018 04/04/18   Newman Pies, MD  PARoxetine (PAXIL) 40 MG tablet Take 40 mg by mouth at bedtime.     [provider]  rOPINIRole (REQUIP) 3 MG tablet Take 1 tablet (3 mg total) by mouth 4 (four) times daily. 04/20/15   [name], MD  spironolactone (ALDACTONE) 25 MG tablet Take 25 mg by mouth every evening.    [provider]    Allergies Hydrocodone-chlorpheniramine; Sulfa antibiotics; Chlorhexidine; Methadone; Benadryl [diphenhydramine]; Latex; and Tape  Family History  Problem Relation Age of Onset  . Cancer Mother   . Stroke Father   . Diabetes Sister   . Mental illness Sister     Social History Social History   Tobacco Use  . Smoking status: Current Every Day Smoker    Packs/day: 0.75    Years: 51.00    Pack years: 38.25    Types: Cigarettes    Start date: 07/08/1964  . Smokeless tobacco: Never Used  Substance Use Topics  . Alcohol use: No  . Drug use: No    Review of Systems  Constitutional: No fever/chills Eyes: No visual changes. ENT: No sore throat. Cardiovascular: Denies chest pain. Respiratory: Denies shortness of breath. Gastrointestinal: No abdominal pain.  No nausea, no vomiting.  No diarrhea.  No constipation. Genitourinary: Negative for dysuria. Musculoskeletal: Negative for back pain. Skin: Negative for  rash. Neurological: Negative for headaches, focal weakness   ____________________________________________   PHYSICAL EXAM:  VITAL SIGNS: ED Triage Vitals  Enc Vitals Group     BP 10/26/18 0117 (!) 148/66     Pulse Rate 10/26/18 0117 80     Resp 10/26/18 0117 14     Temp 10/26/18 0117 98.5 F (36.9 C)     Temp Source 10/26/18 0117 Oral     SpO2 10/26/18 0117 96 %     Weight 10/26/18 0118 174 lb (78.9 kg)     Height 10/26/18 0118 4\' 11"  (1.499 m)     Head Circumference --      Peak Flow --      Pain Score 10/26/18 0118 2     Pain Loc --      Pain Edu? --      Excl. in [place]? --     Constitutional: Alert and oriented. Well appearing and in no acute distress. Eyes: Conjunctivae are normal. PERRL. EOMI. Head: Atraumatic. Nose: No congestion/rhinnorhea.  Ears TMs are clear left ear has a scratches as I described in HPI Mouth/Throat: Mucous membranes are moist.  Oropharynx non-erythematous. Neck: No stridor.   Cardiovascular: Normal rate, regular rhythm. Grossly normal heart sounds.  Good peripheral circulation. Respiratory: Normal respiratory effort.  No retractions. Lungs CTAB. Gastrointestinal: Soft and nontender. No distention. No abdominal bruits. No CVA tenderness. Neurologic:  Normal speech and language. No gross focal neurologic deficits are appreciated. Skin:  Skin is warm, dry and intact. No rash noted.   ____________________________________________   LABS (all labs ordered are listed, but only abnormal results are displayed)  Labs Reviewed - No data to display ____________________________________________  EKG   ____________________________________________  RADIOLOGY  ED MD interpretati  Official radiology report(s): No results found.  ____________________________________________   PROCEDURES  Procedure(s) performed (including Critical Care):  Procedures   ____________________________________________   INITIAL IMPRESSION / ASSESSMENT AND PLAN  / ED COURSE Patient previously had a visit for feeling of bugs crawling in her arms.  She was scratching her  arms.  The symptoms are gone away.  I will see if we can get her to follow-up with her regular doctor.  He may be able to overcome this idea as she has about the parasites.  If not she may need psychiatric assistance.  She does not seem to have any other ideas or delusions.              ____________________________________________   FINAL CLINICAL IMPRESSION(S) / ED DIAGNOSES  Final diagnoses:  Left ear pain     ED Discharge Orders    None       Note:  This document was prepared using Dragon voice recognition software and may include unintentional dictation errors.    Nena Polio, MD 10/26/18 [phone]

## 2018-10-26 NOTE — Discharge Instructions (Addendum)
Use the eardrops 3 or 4 drops to the ear 4 times a day for a week.  Quick cotton in the ear after each time you put the eardrops in.  That should keep the scratches that you have there from getting infected.  It should also keep anything else or getting in your ear.  These be sure you see your regular doctor this coming week.  Return for any increasing ear pain redness or swelling.

## 2018-11-04 ENCOUNTER — Telehealth: Payer: Self-pay

## 2018-11-04 NOTE — Telephone Encounter (Signed)
Incoming clarification request from the pt's pharmacy. They want to ensure that you want this pt to take oxybutynin 5mg  ER three times daily. Please clarify. Thanks

## 2018-11-12 NOTE — Telephone Encounter (Signed)
The extended-release in my opinion should be twice a day or once a day but not 3 times a day.  I do not think this is my patient but she might be.  If she is taking and medial release 5 mg it is 3 times a day for clarification otherwise  Twice daily is okay for the extended release

## 2018-11-17 NOTE — Telephone Encounter (Signed)
Called [facility] spoke with [facility] who states that the patient picked up the rx for oxybutynin on November 05, 2018. SIG is BID.

## 2019-02-25 ENCOUNTER — Other Ambulatory Visit: Payer: Self-pay

## 2019-02-25 DIAGNOSIS — Z01818 Encounter for other preprocedural examination: Secondary | ICD-10-CM

## 2019-02-26 ENCOUNTER — Other Ambulatory Visit: Payer: Medicare HMO

## 2019-02-26 ENCOUNTER — Telehealth: Payer: Self-pay | Admitting: Urology

## 2019-02-26 ENCOUNTER — Other Ambulatory Visit: Payer: Self-pay

## 2019-02-26 DIAGNOSIS — Z01818 Encounter for other preprocedural examination: Secondary | ICD-10-CM

## 2019-02-26 LAB — URINALYSIS, COMPLETE
Bilirubin, UA: NEGATIVE
Glucose, UA: NEGATIVE
Ketones, UA: NEGATIVE
Leukocytes,UA: NEGATIVE
Nitrite, UA: NEGATIVE
Protein,UA: NEGATIVE
RBC, UA: NEGATIVE
Specific Gravity, UA: 1.025 (ref 1.005–1.030)
Urobilinogen, Ur: 0.2 mg/dL (ref 0.2–1.0)
pH, UA: 5 (ref 5.0–7.5)

## 2019-02-26 LAB — MICROSCOPIC EXAMINATION: RBC, Urine: NONE SEEN /hpf (ref 0–2)

## 2019-02-26 NOTE — Telephone Encounter (Signed)
Pt came in Friday to leave urine sample.  I told her you would give her a call on Monday about pre-surgery instructions.

## 2019-03-01 LAB — CULTURE, URINE COMPREHENSIVE

## 2019-03-03 ENCOUNTER — Other Ambulatory Visit: Payer: Self-pay

## 2019-03-03 ENCOUNTER — Encounter
Admission: RE | Admit: 2019-03-03 | Discharge: 2019-03-03 | Disposition: A | Discharge status: Home / Self Care | Payer: Medicare HMO | Source: Ambulatory Visit | Attending: Urology | Admitting: Urology

## 2019-03-03 DIAGNOSIS — Z01812 Encounter for preprocedural laboratory examination: Secondary | ICD-10-CM | POA: Diagnosis present

## 2019-03-03 DIAGNOSIS — Z20828 Contact with and (suspected) exposure to other viral communicable diseases: Secondary | ICD-10-CM | POA: Insufficient documentation

## 2019-03-03 HISTORY — DX: Localized edema: R60.0

## 2019-03-03 LAB — BASIC METABOLIC PANEL
Anion gap: 11 (ref 5–15)
BUN: 13 mg/dL (ref 8–23)
CO2: 24 mmol/L (ref 22–32)
Calcium: 10.2 mg/dL (ref 8.9–10.3)
Chloride: 105 mmol/L (ref 98–111)
Creatinine, Ser: 0.72 mg/dL (ref 0.44–1.00)
GFR calc Af Amer: >60 mL/min (ref >60)
GFR calc non Af Amer: >60 mL/min (ref >60)
Glucose, Bld: 99 mg/dL (ref 70–99)
Potassium: 3.8 mmol/L (ref 3.5–5.1)
Sodium: 140 mmol/L (ref 135–145)

## 2019-03-03 LAB — CBC
HCT: 40.6 % (ref 36.0–46.0)
Hemoglobin: 13.4 g/dL (ref 12.0–15.0)
MCH: 31.5 pg (ref 26.0–34.0)
MCHC: 33 g/dL (ref 30.0–36.0)
MCV: 95.3 fL (ref 80.0–100.0)
Platelets: 224 10*3/uL (ref 150–400)
RBC: 4.26 MIL/uL (ref 3.87–5.11)
RDW: 14.1 % (ref 11.5–15.5)
WBC: 6.1 10*3/uL (ref 4.0–10.5)
nRBC: 0 % (ref 0.0–0.2)

## 2019-03-03 NOTE — Patient Instructions (Addendum)
Your procedure is scheduled on: 03/08/2019 Mon Report to Same Day Surgery 2nd floor medical mall [facility] Entrance-take elevator on left to 2nd floor.  Check in with surgery information desk.) To find out your arrival time please call [phone] between 1PM - 3PM on 03/05/2019 Fri  Remember: Instructions that are not followed completely may result in serious medical risk, up to and including death, or upon the discretion of your surgeon and anesthesiologist your surgery may need to be rescheduled.    _x___ 1. Do not eat food after midnight the night before your procedure. You may drink clear liquids up to 2 hours before you are scheduled to arrive at the hospital for your procedure.  Do not drink clear liquids within 2 hours of your scheduled arrival to the hospital.  Clear liquids include  --Water or Apple juice without pulp  --Clear carbohydrate beverage such as ClearFast or Gatorade  --Black Coffee or Clear Tea (No milk, no creamers, do not add anything to                  the coffee or Tea Type 1 and type 2 diabetics should only drink water.   ____Ensure clear carbohydrate drink on the way to the hospital for bariatric patients  ____Ensure clear carbohydrate drink 3 hours before surgery.   No gum chewing or hard candies.     __x__ 2. No Alcohol for 24 hours before or after surgery.   __x__3. No Smoking or e-cigarettes for 24 prior to surgery.  Do not use any chewable tobacco products for at least 6 hour prior to surgery   ____  4. Bring all medications with you on the day of surgery if instructed.    __x__ 5. Notify your doctor if there is any change in your medical condition     (cold, fever, infections).    x___6. On the morning of surgery brush your teeth with toothpaste and water.  You may rinse your mouth with mouth wash if you wish.  Do not swallow any toothpaste or mouthwash.   Do not wear jewelry, make-up, hairpins, clips or nail polish.  Do not wear lotions,  powders, or perfumes. You may wear deodorant.  Do not shave 48 hours prior to surgery. Men may shave face and neck.  Do not bring valuables to the hospital.    [facility] is not responsible for any belongings or valuables.               Contacts, dentures or bridgework may not be worn into surgery.  Leave your suitcase in the car. After surgery it may be brought to your room.  For patients admitted to the hospital, discharge time is determined by your                       treatment team.  _  Patients discharged the day of surgery will not be allowed to drive home.  You will need someone to drive you home and stay with you the night of your procedure.    Please read over the following fact sheets that you were given:   [facility] Preparing for Surgery and or MRSA Information   _x___ Take anti-hypertensive listed below, cardiac, seizure, asthma,     anti-reflux and psychiatric medicines. These include:  1. albuterol (PROVENTIL HFA;VENTOLIN HFA) 108 (90 Base) MCG/ACT   2.metoprolol succinate (TOPROL-XL) 50 MG 24 hr tablet  3.oxybutynin (DITROPAN) 5 MG tablet  4.  5.  6.  ____Fleets enema or Magnesium Citrate as directed.   ____ Use CHG Soap or sage wipes as directed on instruction sheet   ____ Use inhalers on the day of surgery and bring to hospital day of surgery  ____ Stop Metformin and Janumet 2 days prior to surgery.    ____ Take 1/2 of usual insulin dose the night before surgery and none on the morning     surgery.   _x___ Follow recommendations from Cardiologist, Pulmonologist or PCP regarding          stopping Aspirin, Coumadin, Plavix ,Eliquis, Effient, or Pradaxa, and Pletal.  X____Stop Anti-inflammatories such as Advil, Aleve, Ibuprofen, Motrin, Naproxen, Naprosyn, Goodies powders or aspirin products. OK to take Tylenol and                          Celebrex.   _x___ Stop supplements until after surgery.  But may continue Vitamin D, Vitamin B,       and  multivitamin.   ____ Bring C-Pap to the hospital.

## 2019-03-04 ENCOUNTER — Other Ambulatory Visit: Payer: Self-pay

## 2019-03-04 ENCOUNTER — Other Ambulatory Visit
Admission: RE | Admit: 2019-03-04 | Discharge: 2019-03-04 | Disposition: A | Discharge status: Home / Self Care | Payer: Medicare HMO | Source: Ambulatory Visit | Attending: Urology | Admitting: Urology

## 2019-03-04 DIAGNOSIS — Z01812 Encounter for preprocedural laboratory examination: Secondary | ICD-10-CM | POA: Diagnosis not present

## 2019-03-04 LAB — SARS CORONAVIRUS 2 (TAT 6-24 HRS): SARS Coronavirus 2: NEGATIVE

## 2019-03-05 NOTE — H&P (Signed)
I dictated along the last time. Ann Silva was given VESIcare. I was going to discuss I believe percutaneous tibial nerve stimulation an InterStim and/or Botox. I gave her VESIcare 10 mg last time. Ann Silva had a recent CT scan angiogram and Ann Silva might have a hyperdense cyst in the right kidney or a solid tumor.   I don't think the patient was ever given VESIcare because of co-pay but is doing beautiful on oxybutynin twice a day with no infections and much better continence.   I will get an MRI for complex renal cyst and call her with results. Otherwise assess durability of oxybutynin 4 months.   I reviewed my notes again and primarily the patient has an overactive bladder. We have talked about InterStim and Botox in the past. Ann Silva has failed percutaneous tibial nerve stimulation in [place]. Ann Silva was responding well to oxybutynin last time. Ann Silva has renal cyst on MRI.   Patient said up until a month ago Ann Silva was almost completely dry and was happy. This would support more of a refractory therapy of meds are working. Now Ann Silva has urge incontinence again. Ann Silva has no cystitis symptoms. Ann Silva has bilateral flank soreness.   Kidneys are normal from the recent MRI. Urine looked normal by sent for culture. I think we should try to increase the dosage ever oxybutynin since Ann Silva was doing so well on it.   Because of the back pain and being Friday I called ciprofloxacin 250 mg twice a day for 1 week. I will also change her oxybutynin to 5 mg 3 times a day with 90 tablets and 11 refills. I will see her in 1 month. Then all talked about Botox and InterStim pending results   Today  Frequency is stable  last urine culture positive.  Incontinence worsened ever. Frequency stable. I went over Botox and InterStim again with usual template gave handouts. Ann Silva remember them. Ann Silva might want to do the newer InterStim down the road especially when the company comes out with the MRI friendly 1. but Ann Silva wants to try Botox for now. Three day  prescription given. Usual protocol followed   There is no other aggravating or relieving factors  There is no other associated signs and symptoms  The severity of the symptoms is moderate  The symptoms are ongoing and bothersome      ALLERGIES: Benadryl TABS Codeine methadone Sulfa Drugs - Trouble Breathing Tussionex Pennkinetic ER LQCR    MEDICATIONS: Benazepril Hcl 40 mg tablet Oral  Cyclobenzaprine Hcl  Oxybutynin Chloride Er 5 mg tablet, extended release 24 hr 1 tablet PO TID  Oxycodone Hcl  Ropinirole Hcl 2 mg tablet Oral  Sertraline Hcl 100 mg tablet Oral     GU PSH: None     PSH Notes: Colon Surgery, Back Surgery, Gallbladder Surgery, Hip Surgery   NON-GU PSH: None   GU PMH: Renal cyst - 07/16/2018 Chronic cystitis (w/o hematuria), Chronic cystitis - 2017 Mixed incontinence, Urge and stress incontinence - 2017 Urinary Frequency, Increased urinary frequency - 2015 Urinary Tract Inf, Unspec site, Urinary tract infection - 2015 Nocturia, Nocturia - 2015    NON-GU PMH: Encounter for general adult medical examination without abnormal findings, Encounter for preventive health examination - 2016 Anxiety, Anxiety - 2015 Colon Cancer, History, History of malignant neoplasm of colon - 2015 Personal history of other diseases of the circulatory system, History of hypertension - 2015, History of cardiac murmur, - 2015 Personal history of other diseases of the digestive system, History of esophageal  reflux - 2015 Personal history of other diseases of the musculoskeletal system and connective tissue, History of arthritis - 2015 Personal history of other diseases of the nervous system and sense organs, History of sleep apnea - 2015    FAMILY HISTORY: cerebrovascular accident - Runs In Family Colon Cancer - Runs In Family Diabetes - Runs In Family Hypertension - Runs In Family Kidney Stones - Runs In Family Microscopic hematuria - Runs In Family   SOCIAL HISTORY: None     Notes: Alcohol use, Daily caffeine consumption, 2-3 servings a day, Married, Cigarette smoker, Retired   REVIEW OF SYSTEMS:    GU Review Female:   Patient reports frequent urination, hard to postpone urination, get up at night to urinate, leakage of urine, and have to strain to urinate. Patient denies burning /pain with urination, stream starts and stops, trouble starting your stream, and being pregnant.  Gastrointestinal (Upper):   Patient reports indigestion/ heartburn. Patient denies nausea and vomiting.  Gastrointestinal (Lower):   Patient denies diarrhea and constipation.  Constitutional:   Patient reports fatigue. Patient denies fever, night sweats, and weight loss.  Skin:   Patient denies skin rash/ lesion and itching.  Eyes:   Patient denies blurred vision and double vision.  Ears/ Nose/ Throat:   Patient reports sinus problems. Patient denies sore throat.  Hematologic/Lymphatic:   Patient denies swollen glands and easy bruising.  Cardiovascular:   Patient denies leg swelling and chest pains.  Respiratory:   Patient reports shortness of breath. Patient denies cough.  Endocrine:   Patient denies excessive thirst.  Musculoskeletal:   Patient reports joint pain. Patient denies back pain.  Neurological:   Patient reports headaches. Patient denies dizziness.  Psychologic:   Patient denies depression and anxiety.   VITAL SIGNS:      12/01/2018 03:18 PM  Weight 180.1 lb / 81.69 kg  Height 58 in / 147.32 cm  BP 117/80 mmHg  Pulse 84 /min  Temperature 98.2 F / 36.7 C  BMI 37.6 kg/m   PAST DATA REVIEWED:  Source Of History:  Patient   PROCEDURES:          Urinalysis - 81003 Dipstick Dipstick Cont'd  Color: Yellow Bilirubin: Neg  Appearance: Clear Ketones: Neg  Specific Gravity: 1.025 Blood: Neg  pH: <=5.0 Protein: Neg  Glucose: Neg Urobilinogen: 0.2    Nitrites: Neg    Leukocyte Esterase: Neg         Urinalysis - 81003 Dipstick Dipstick Cont'd  Color: Yellow Bilirubin: Neg  mg/dL  Appearance: Clear Ketones: Neg mg/dL  Specific Gravity: 1.025 Blood: Neg ery/uL  pH: <=5.0 Protein: Neg mg/dL  Glucose: Neg mg/dL Urobilinogen: 0.2 mg/dL    Nitrites: Neg    Leukocyte Esterase: Neg leu/uL    ASSESSMENT:      ICD-10 Details  1 GU:   Chronic cystitis (w/o hematuria) - N30.20   2   Urinary Frequency - R35.0      PLAN:            Medications New Meds: Cipro 250 mg tablet 1 tablet PO BID   #6  0 Refill(s)    Stop Meds: Calcium TABS Oral  Start: 11/17/2013  Discontinue: 12/01/2018  - Reason: The medication cycle was completed.  Iron 236 mg (27 mg iron) tablet Oral  Start: 09/13/2014  Discontinue: 12/01/2018  - Reason: The medication cycle was completed.  Naprosyn 500 mg tablet Oral  Start: 11/17/2013  Discontinue: 12/01/2018  - Reason: The medication cycle was  completed.     Patient has refractory urge incontinence and frequency. Based upon diary from company frequency went from 08/15/2016. Nocturia went from 2-1. Leaks when from 18-7. Pads went from 9-5.   At baseline Ann Silva had mixed incontinence but the urge was most severe. Ann Silva was soak 6 pads a day with no bedwetting. I thought 75% of the problem was an overactive bladder.   Device easily removed and left side utilized frequency and nocturia greatly improved. Urgency incontinence greatly improved. Ann Silva utilize left side. I went over the case again Ann Silva like to proceed. Rare risk of lack of efficacy discussed   Frequency stable and clinically noninfected   I went over each oils of [place] or surgical center and Ann Silva said either 1 works but Ann Silva would like to do it in the facility that is the fastest   After a thorough review of the management options for the patient's condition the patient  elected to proceed with surgical therapy as noted above. We have discussed the potential benefits and risks of the procedure, side effects of the proposed treatment, the likelihood of the patient achieving the goals of the  procedure, and any potential problems that might occur during the procedure or recuperation. Informed consent has been obtained.

## 2019-03-06 ENCOUNTER — Encounter: Payer: Self-pay | Admitting: Anesthesiology

## 2019-03-08 ENCOUNTER — Encounter
Admission: RE | Disposition: A | Discharge status: Home / Self Care | Payer: Self-pay | Source: Home / Self Care | Attending: Urology

## 2019-03-08 ENCOUNTER — Ambulatory Visit
Admission: RE | Admit: 2019-03-08 | Discharge: 2019-03-08 | Disposition: A | Discharge status: Home / Self Care | Payer: Medicare HMO | Attending: Urology | Admitting: Urology

## 2019-03-08 ENCOUNTER — Ambulatory Visit: Payer: Medicare HMO | Admitting: Anesthesiology

## 2019-03-08 ENCOUNTER — Encounter: Payer: Self-pay | Admitting: *Deleted

## 2019-03-08 ENCOUNTER — Ambulatory Visit: Payer: Medicare HMO

## 2019-03-08 ENCOUNTER — Other Ambulatory Visit: Payer: Self-pay

## 2019-03-08 DIAGNOSIS — Z87891 Personal history of nicotine dependence: Secondary | ICD-10-CM | POA: Diagnosis not present

## 2019-03-08 DIAGNOSIS — Z888 Allergy status to other drugs, medicaments and biological substances status: Secondary | ICD-10-CM | POA: Diagnosis not present

## 2019-03-08 DIAGNOSIS — J449 Chronic obstructive pulmonary disease, unspecified: Secondary | ICD-10-CM | POA: Diagnosis not present

## 2019-03-08 DIAGNOSIS — Z9049 Acquired absence of other specified parts of digestive tract: Secondary | ICD-10-CM | POA: Diagnosis not present

## 2019-03-08 DIAGNOSIS — G4730 Sleep apnea, unspecified: Secondary | ICD-10-CM | POA: Insufficient documentation

## 2019-03-08 DIAGNOSIS — E669 Obesity, unspecified: Secondary | ICD-10-CM | POA: Insufficient documentation

## 2019-03-08 DIAGNOSIS — Z882 Allergy status to sulfonamides status: Secondary | ICD-10-CM | POA: Insufficient documentation

## 2019-03-08 DIAGNOSIS — R51 Headache: Secondary | ICD-10-CM | POA: Diagnosis not present

## 2019-03-08 DIAGNOSIS — F419 Anxiety disorder, unspecified: Secondary | ICD-10-CM | POA: Diagnosis not present

## 2019-03-08 DIAGNOSIS — I2510 Atherosclerotic heart disease of native coronary artery without angina pectoris: Secondary | ICD-10-CM | POA: Insufficient documentation

## 2019-03-08 DIAGNOSIS — Z885 Allergy status to narcotic agent status: Secondary | ICD-10-CM | POA: Insufficient documentation

## 2019-03-08 DIAGNOSIS — N393 Stress incontinence (female) (male): Secondary | ICD-10-CM | POA: Diagnosis not present

## 2019-03-08 DIAGNOSIS — Z419 Encounter for procedure for purposes other than remedying health state, unspecified: Secondary | ICD-10-CM

## 2019-03-08 DIAGNOSIS — F329 Major depressive disorder, single episode, unspecified: Secondary | ICD-10-CM | POA: Insufficient documentation

## 2019-03-08 DIAGNOSIS — Z800 Family history of malignant neoplasm of digestive organs: Secondary | ICD-10-CM | POA: Diagnosis not present

## 2019-03-08 DIAGNOSIS — I509 Heart failure, unspecified: Secondary | ICD-10-CM | POA: Insufficient documentation

## 2019-03-08 DIAGNOSIS — Z841 Family history of disorders of kidney and ureter: Secondary | ICD-10-CM | POA: Diagnosis not present

## 2019-03-08 DIAGNOSIS — Z86711 Personal history of pulmonary embolism: Secondary | ICD-10-CM | POA: Diagnosis not present

## 2019-03-08 DIAGNOSIS — N3941 Urge incontinence: Secondary | ICD-10-CM | POA: Diagnosis present

## 2019-03-08 DIAGNOSIS — D649 Anemia, unspecified: Secondary | ICD-10-CM | POA: Diagnosis not present

## 2019-03-08 DIAGNOSIS — Z8249 Family history of ischemic heart disease and other diseases of the circulatory system: Secondary | ICD-10-CM | POA: Insufficient documentation

## 2019-03-08 DIAGNOSIS — Z6838 Body mass index (BMI) 38.0-38.9, adult: Secondary | ICD-10-CM | POA: Diagnosis not present

## 2019-03-08 DIAGNOSIS — I110 Hypertensive heart disease with heart failure: Secondary | ICD-10-CM | POA: Diagnosis not present

## 2019-03-08 DIAGNOSIS — M1990 Unspecified osteoarthritis, unspecified site: Secondary | ICD-10-CM | POA: Insufficient documentation

## 2019-03-08 DIAGNOSIS — Z833 Family history of diabetes mellitus: Secondary | ICD-10-CM | POA: Diagnosis not present

## 2019-03-08 DIAGNOSIS — G709 Myoneural disorder, unspecified: Secondary | ICD-10-CM | POA: Insufficient documentation

## 2019-03-08 HISTORY — PX: INTERSTIM IMPLANT PLACEMENT: SHX5130

## 2019-03-08 SURGERY — INSERTION, SACRAL NERVE STIMULATOR, INTERSTIM, STAGE 1
Anesthesia: General

## 2019-03-08 MED ORDER — PROPOFOL 500 MG/50ML IV EMUL
INTRAVENOUS | Status: AC
  Filled 2019-03-08: qty 50

## 2019-03-08 MED ORDER — LIDOCAINE HCL (PF) 2 % IJ SOLN
INTRAMUSCULAR | Status: AC
  Filled 2019-03-08: qty 10

## 2019-03-08 MED ORDER — FAMOTIDINE 20 MG PO TABS
20.0000 mg | ORAL_TABLET | Freq: Once | ORAL | Status: AC
  Administered 2019-03-08: 12:00:00 20 mg via ORAL

## 2019-03-08 MED ORDER — MIDAZOLAM HCL 2 MG/2ML IJ SOLN
INTRAMUSCULAR | Status: AC
  Filled 2019-03-08: qty 2

## 2019-03-08 MED ORDER — FAMOTIDINE 20 MG PO TABS
ORAL_TABLET | ORAL | Status: AC
  Administered 2019-03-08: 20 mg via ORAL
  Filled 2019-03-08: qty 1

## 2019-03-08 MED ORDER — LACTATED RINGERS IV SOLN
INTRAVENOUS | Status: DC
  Administered 2019-03-08: 12:00:00 via INTRAVENOUS

## 2019-03-08 MED ORDER — ACETAMINOPHEN 325 MG PO TABS
650.0000 mg | ORAL_TABLET | Freq: Once | ORAL | Status: AC
  Administered 2019-03-08: 650 mg via ORAL

## 2019-03-08 MED ORDER — ACETAMINOPHEN 10 MG/ML IV SOLN
INTRAVENOUS | Status: AC
  Filled 2019-03-08: qty 100

## 2019-03-08 MED ORDER — PROPOFOL 10 MG/ML IV BOLUS
INTRAVENOUS | Status: AC
  Filled 2019-03-08: qty 20

## 2019-03-08 MED ORDER — LIDOCAINE-EPINEPHRINE (PF) 1 %-1:200000 IJ SOLN
INTRAMUSCULAR | Status: AC
  Filled 2019-03-08: qty 30

## 2019-03-08 MED ORDER — MIDAZOLAM HCL 2 MG/2ML IJ SOLN
INTRAMUSCULAR | Status: DC | PRN
  Administered 2019-03-08: 2 mg via INTRAVENOUS

## 2019-03-08 MED ORDER — PROPOFOL 10 MG/ML IV BOLUS
INTRAVENOUS | Status: DC | PRN
  Administered 2019-03-08 (×3): 20 mg via INTRAVENOUS

## 2019-03-08 MED ORDER — CEFAZOLIN SODIUM-DEXTROSE 2-4 GM/100ML-% IV SOLN
INTRAVENOUS | Status: AC
  Filled 2019-03-08: qty 100

## 2019-03-08 MED ORDER — FENTANYL CITRATE (PF) 100 MCG/2ML IJ SOLN: 25.0000 ug | INTRAMUSCULAR | Status: DC | PRN

## 2019-03-08 MED ORDER — CEFAZOLIN SODIUM-DEXTROSE 2-4 GM/100ML-% IV SOLN
2.0000 g | Freq: Once | INTRAVENOUS | Status: AC
  Administered 2019-03-08: 2 g via INTRAVENOUS

## 2019-03-08 MED ORDER — LIDOCAINE-EPINEPHRINE (PF) 1 %-1:200000 IJ SOLN
INTRAMUSCULAR | Status: DC | PRN
  Administered 2019-03-08: 40 mL

## 2019-03-08 MED ORDER — OXYMETAZOLINE HCL 0.05 % NA SOLN
NASAL | Status: AC
  Filled 2019-03-08: qty 30

## 2019-03-08 MED ORDER — BUPIVACAINE HCL (PF) 0.5 % IJ SOLN
INTRAMUSCULAR | Status: AC
  Filled 2019-03-08: qty 30

## 2019-03-08 MED ORDER — ONDANSETRON HCL 4 MG/2ML IJ SOLN: 4.0000 mg | Freq: Once | INTRAMUSCULAR | Status: DC | PRN

## 2019-03-08 MED ORDER — LIDOCAINE HCL (CARDIAC) PF 100 MG/5ML IV SOSY
PREFILLED_SYRINGE | INTRAVENOUS | Status: DC | PRN
  Administered 2019-03-08: 50 mg via INTRAVENOUS

## 2019-03-08 MED ORDER — ACETAMINOPHEN 325 MG PO TABS
ORAL_TABLET | ORAL | Status: AC
  Filled 2019-03-08: qty 2

## 2019-03-08 MED ORDER — PROPOFOL 500 MG/50ML IV EMUL
INTRAVENOUS | Status: DC | PRN
  Administered 2019-03-08: 50 ug/kg/min via INTRAVENOUS

## 2019-03-08 MED ORDER — HYDROMORPHONE HCL 2 MG PO TABS: 2.0000 mg | ORAL_TABLET | Freq: Two times a day (BID) | ORAL | 0 refills | Status: AC | PRN

## 2019-03-08 SURGICAL SUPPLY — 54 items
BAG URINE DRAINAGE (UROLOGICAL SUPPLIES) ×1 IMPLANT
BENZOIN TINCTURE PRP APPL 2/3 (GAUZE/BANDAGES/DRESSINGS) ×4 IMPLANT
BLADE SURG 15 STRL LF DISP TIS (BLADE) ×1 IMPLANT
BLADE SURG 15 STRL SS (BLADE) ×1
COVER BACK TABLE REUSABLE LG (DRAPES) ×2 IMPLANT
COVER MAYO STAND REUSABLE (DRAPES) ×2 IMPLANT
COVER PROBE FLX POLY STRL (MISCELLANEOUS) ×1 IMPLANT
DERMABOND ADVANCED (GAUZE/BANDAGES/DRESSINGS) ×1
DERMABOND ADVANCED .7 DNX12 (GAUZE/BANDAGES/DRESSINGS) ×1 IMPLANT
DRAPE 3/4 80X56 (DRAPES) ×2 IMPLANT
DRAPE C-ARM 42X72 X-RAY (DRAPES) ×2 IMPLANT
DRAPE C-ARM XRAY 36X54 (DRAPES) ×1 IMPLANT
DRAPE INCISE 23X17 IOBAN STRL (DRAPES) ×1
DRAPE INCISE 23X17 STRL (DRAPES) ×1 IMPLANT
DRAPE INCISE IOBAN 23X17 STRL (DRAPES) ×1 IMPLANT
DRAPE INCISE IOBAN 66X45 STRL (DRAPES) ×2 IMPLANT
DRAPE LAPAROSCOPIC ABDOMINAL (DRAPES) ×2 IMPLANT
DRSG TEGADERM 2-3/8X2-3/4 SM (GAUZE/BANDAGES/DRESSINGS) ×2 IMPLANT
DRSG TEGADERM 4X4.75 (GAUZE/BANDAGES/DRESSINGS) ×3 IMPLANT
DRSG TELFA 3X8 NADH (GAUZE/BANDAGES/DRESSINGS) ×2 IMPLANT
ELECT REM PT RETURN 9FT ADLT (ELECTROSURGICAL) ×2
ELECTRODE REM PT RTRN 9FT ADLT (ELECTROSURGICAL) ×1 IMPLANT
GAUZE 4X4 16PLY RFD (DISPOSABLE) ×1 IMPLANT
GAUZE SPONGE 4X4 12PLY STRL (GAUZE/BANDAGES/DRESSINGS) ×2 IMPLANT
GLOVE BIO SURGEON STRL SZ7.5 (GLOVE) ×2 IMPLANT
GOWN STRL REUS W/ TWL LRG LVL3 (GOWN DISPOSABLE) ×1 IMPLANT
GOWN STRL REUS W/ TWL XL LVL3 (GOWN DISPOSABLE) ×1 IMPLANT
GOWN STRL REUS W/TWL LRG LVL3 (GOWN DISPOSABLE) ×1
GOWN STRL REUS W/TWL XL LVL3 (GOWN DISPOSABLE) ×1
HOLDER FOLEY CATH W/STRAP (MISCELLANEOUS) ×1 IMPLANT
KIT HANDSET INTERSTIM COMM (NEUROSIGN) ×1 IMPLANT
KIT TURNOVER CYSTO (KITS) ×2 IMPLANT
LEAD INTERSTIM 4.32 28 L (NEUROSIGN) ×1 IMPLANT
NEEDLE HYPO 22GX1.5 SAFETY (NEEDLE) ×2 IMPLANT
NEUROSTIMULATOR 1.7X2X.06 (UROLOGICAL SUPPLIES) ×1 IMPLANT
PAD DRESSING TELFA 3X8 NADH (GAUZE/BANDAGES/DRESSINGS) ×1 IMPLANT
STIMULATOR INTERSTIM 2X1.7X.3 (Miscellaneous) ×1 IMPLANT
STRIP CLOSURE SKIN 1/2X4 (GAUZE/BANDAGES/DRESSINGS) ×2 IMPLANT
SUCTION FRAZIER HANDLE 10FR (MISCELLANEOUS) ×1
SUCTION TUBE FRAZIER 10FR DISP (MISCELLANEOUS) ×1 IMPLANT
SUT SILK 2 0 (SUTURE) ×1
SUT SILK 2-0 18XBRD TIE 12 (SUTURE) ×1 IMPLANT
SUT VIC AB 3-0 SH 27 (SUTURE) ×1
SUT VIC AB 3-0 SH 27X BRD (SUTURE) ×2 IMPLANT
SUT VIC AB 4-0 PS2 18 (SUTURE) ×2 IMPLANT
SUT VIC AB 4-0 RB1 27 (SUTURE) ×2
SUT VIC AB 4-0 RB1 27X BRD (SUTURE) ×3 IMPLANT
SUT VIC AB 4-0 SH 27 (SUTURE) ×1
SUT VIC AB 4-0 SH 27XANBCTRL (SUTURE) IMPLANT
SYR 10ML LL (SYRINGE) ×4 IMPLANT
SYR BULB IRRIG 60ML STRL (SYRINGE) ×2 IMPLANT
TOWEL OR 17X26 4PK STRL BLUE (TOWEL DISPOSABLE) ×2 IMPLANT
TRAY FOLEY MTR SLVR 16FR STAT (SET/KITS/TRAYS/PACK) ×1 IMPLANT
WATER STERILE IRR 1000ML POUR (IV SOLUTION) ×2 IMPLANT

## 2019-03-08 NOTE — Transfer of Care (Signed)
Immediate Anesthesia Transfer of Care Note  Patient: Ann Silva  Procedure(s) Performed: Barrie Lyme IMPLANT FIRST AND SECOND STAGE WITH IMPEDENCE CHECK (N/A )  Patient Location: PACU  Anesthesia Type:General  Level of Consciousness: drowsy and patient cooperative  Airway & Oxygen Therapy: Patient Spontanous Breathing  Post-op Assessment: Report given to RN and Post -op Vital signs reviewed and stable  Post vital signs: Reviewed and stable  Last Vitals:  Vitals Value Taken Time  BP 142/83 03/08/19 1532  Temp    Pulse 64 03/08/19 1532  Resp 21 03/08/19 1532  SpO2 93 % 03/08/19 1532  Vitals shown include unvalidated device data.  Last Pain:  Vitals:   03/08/19 1132  TempSrc: Temporal  PainSc: 0-No pain         Complications: No apparent anesthesia complications

## 2019-03-08 NOTE — Interval H&P Note (Signed)
History and Physical Interval Note:  03/08/2019 11:32 AM  Ann Silva  has presented today for surgery, with the diagnosis of refractory urge incontinence.  The various methods of treatment have been discussed with the patient and family. After consideration of risks, benefits and other options for treatment, the patient has consented to  Procedure(s): INTERSTIM IMPLANT FIRST AND SECOND STAGE WITH IMPEDENCE CHECK (N/A) as a surgical intervention.  The patient's history has been reviewed, patient examined, no change in status, stable for surgery.  I have reviewed the patient's chart and labs.  Questions were answered to the patient's satisfaction.     Izrael Peak A Donald Memoli

## 2019-03-08 NOTE — Op Note (Signed)
Preoperative diagnosis: Refractory urgency incontinence Postoperative diagnosis: Refractory urgency incontinence Surgery: Implantation of InterStim stage I and stage II and impedance check Surgeon: Dr. Nicki Reaper Shlomie Romig  The patient has the above diagnosis and consented the above procedure.  Extra care was taken with positioning the patient in the prone position.  Preoperative antibiotics were given.  There was a long delay because of very difficult intravenous access but finally anesthesia was able to obtain one in the right arm and it was safe to proceed  The S3 foramina was easily located with 3.5 inch foramen needles and fluoroscopy on the left side.  It was negotiated nicely.  The below dictation was utilized to the point of passing the lead on the left.  In spite of several attempts it would push off to the patient's far left side multiple times so I decided to use the right side.  I will complete the dictation on the right side up until this point and beyond.  20 cc of a lidocaine epinephrine mixture was utilized on the left  I located the S3 foramina on the right.  Fluoroscopically was in very good position. Fluoroscopy was a bit granular throughout.  20 cc of a lidocaine epinephrine mixture was used on the right.  It was easy to find the S3 foramina and she had very good toe and Bellow responses.  I remove the inner aspect of the foramen needle and passed the guide to the appropriate depth fluoroscopically.  I made a 1 cm incision.  I passed a white trocar to the appropriate depth.  I removed its inner aspect.  I passed the coud-tip lead to the appropriate level with position 3 and forward just bridging the bone table.  She had very good toe and beloow response in all 4 positions.  White sheath was disengaged with the described technique not moving the lead under fluoroscopy.  I made a 4 cm right upper buttock incision with appropriate bony landmarks.  I instilled 10 cc of lidocaine epinephrine  mixture.  A scalpel and cautery was used to get the appropriate depth through subcutaneous tissue.  Made a nice pocket.  I delivered the lead from medial to lateral with the passer.  I connected the lead to the IPG and utilize a screwdriver.  IPG was placed in the pocket laying nicely  As a separate procedure impedance was checked.  Normal impedance in all 4-lead positions  Tension-free closure right upper buttock with running 3-0 subcutaneous closure followed by 40 subcuticular Vicryl in both occasions.  Interrupted 4-0 Vicryl was used for the two 1 cm incisions in the midline.  The left was from the previous left-sided procedure  X-rays were taken.  Very pleased with surgery.  Sterile dressing applied and she will be followed as per protocol

## 2019-03-08 NOTE — Anesthesia Preprocedure Evaluation (Signed)
Anesthesia Evaluation  Patient identified by MRN, date of birth, ID band Patient awake    Reviewed: Allergy & Precautions, NPO status , Patient's Chart, lab work & pertinent test results, reviewed documented beta blocker date and time   Airway Mallampati: III  TM Distance: >3 FB     Dental  (+) Chipped   Pulmonary shortness of breath, sleep apnea , pneumonia, resolved, COPD, Current Smoker and Patient abstained from smoking.,           Cardiovascular hypertension, Pt. on medications and Pt. on home beta blockers + CAD and +CHF  + Valvular Problems/Murmurs      Neuro/Psych  Headaches, PSYCHIATRIC DISORDERS Anxiety Depression  Neuromuscular disease    GI/Hepatic   Endo/Other    Renal/GU      Musculoskeletal  (+) Arthritis ,   Abdominal   Peds  Hematology  (+) anemia ,   Anesthesia Other Findings Obese. Hx of PE.  Reproductive/Obstetrics                             Anesthesia Physical Anesthesia Plan  ASA: III  Anesthesia Plan: General   Post-op Pain Management:    Induction: Intravenous  PONV Risk Score and Plan:   Airway Management Planned:   Additional Equipment:   Intra-op Plan:   Post-operative Plan:   Informed Consent: I have reviewed the patients History and Physical, chart, labs and discussed the procedure including the risks, benefits and alternatives for the proposed anesthesia with the patient or authorized representative who has indicated his/her understanding and acceptance.       Plan Discussed with: CRNA  Anesthesia Plan Comments:         Anesthesia Quick Evaluation

## 2019-03-08 NOTE — Anesthesia Post-op Follow-up Note (Signed)
Anesthesia QCDR form completed.        

## 2019-03-08 NOTE — Discharge Instructions (Signed)
I have reviewed discharge instructions in detail with the patient. They will follow-up with me or their physician as scheduled. My nurse will also be calling the patients as per protocol.  °AMBULATORY SURGERY  °DISCHARGE INSTRUCTIONS ° ° °1) The drugs that you were given will stay in your system until tomorrow so for the next 24 hours you should not: ° °A) Drive an automobile °B) Make any legal decisions °C) Drink any alcoholic beverage ° ° °2) You may resume regular meals tomorrow.  Today it is better to start with liquids and gradually work up to solid foods. ° °You may eat anything you prefer, but it is better to start with liquids, then soup and crackers, and gradually work up to solid foods. ° ° °3) Please notify your doctor immediately if you have any unusual bleeding, trouble breathing, redness and pain at the surgery site, drainage, fever, or pain not relieved by medication. ° ° ° °4) Additional Instructions: ° ° ° ° ° ° ° °Please contact your physician with any problems or Same Day Surgery at [phone], Monday through Friday 6 am to 4 pm, or [place] [facility] Main number at [phone]. °

## 2019-03-08 NOTE — Progress Notes (Signed)
Cardiac exam: HS normal and pulse regular Breaths quiet to ausculation

## 2019-03-11 ENCOUNTER — Telehealth: Payer: Self-pay | Admitting: Urology

## 2019-03-11 NOTE — Telephone Encounter (Signed)
Pt called and has some questions about aftercare for her bandage from surgery. Please advise.

## 2019-03-11 NOTE — Anesthesia Postprocedure Evaluation (Signed)
Anesthesia Post Note  Patient: Ann Silva  Procedure(s) Performed: Barrie Lyme IMPLANT FIRST AND SECOND STAGE WITH IMPEDENCE CHECK (N/A )  Patient location during evaluation: PACU Anesthesia Type: General Level of consciousness: awake and alert Pain management: pain level controlled Vital Signs Assessment: post-procedure vital signs reviewed and stable Respiratory status: spontaneous breathing, nonlabored ventilation, respiratory function stable and patient connected to nasal cannula oxygen Cardiovascular status: blood pressure returned to baseline and stable Postop Assessment: no apparent nausea or vomiting Anesthetic complications: no     Last Vitals:  Vitals:   03/08/19 1608 03/08/19 1625  BP:  (!) 154/86  Pulse: 61 (!) 56  Resp: 18 20  Temp:  36.4 C  SpO2: 99% 95%    Last Pain:  Vitals:   03/09/19 0857  TempSrc:   PainSc: 2                  Martha Clan

## 2019-03-11 NOTE — Telephone Encounter (Signed)
LMOM for patient to return call.

## 2019-03-12 NOTE — Telephone Encounter (Signed)
Patient asks if she can take a shower with the bandage on. Per Elberta Leatherwood, CMA patient was notified that since it is a Tegaderm she may take a shower but do not take a bath or soak the bandage. She will need to pat the Tegaderm dry after taking a shower. She was encouraged not to remove bandage. Patient expresses understanding of instructions.

## 2019-03-22 ENCOUNTER — Telehealth: Payer: Self-pay | Admitting: Urology

## 2019-03-22 ENCOUNTER — Ambulatory Visit: Payer: Medicare Other | Admitting: Urology

## 2019-03-22 NOTE — Telephone Encounter (Signed)
Per Dr. MaDiarmid ok to remove dressing and bandage. Patient verbalized understanding.

## 2019-03-22 NOTE — Telephone Encounter (Signed)
Pt had to r/s her post op appt for today,due to being sick. She would like a call back to discuss dressing changes. Please advise.

## 2019-04-12 ENCOUNTER — Encounter: Payer: Self-pay | Admitting: Urology

## 2019-04-12 ENCOUNTER — Other Ambulatory Visit: Payer: Self-pay

## 2019-04-12 ENCOUNTER — Ambulatory Visit: Payer: Medicare HMO | Admitting: Urology

## 2019-04-12 VITALS — BP 139/81 | HR 74 | Ht <= 58 in | Wt 178.0 lb

## 2019-04-12 DIAGNOSIS — R3915 Urgency of urination: Secondary | ICD-10-CM

## 2019-04-12 DIAGNOSIS — N3946 Mixed incontinence: Secondary | ICD-10-CM

## 2019-04-12 MED ORDER — NITROFURANTOIN MONOHYD MACRO 100 MG PO CAPS: 100.0000 mg | ORAL_CAPSULE | Freq: Two times a day (BID) | ORAL | 0 refills | Status: DC

## 2019-04-12 NOTE — Progress Notes (Signed)
04/12/2019 3:47 PM   ARDITA SURGEON 03-04-1946 [id number]  Referring provider: Rusty Aus, MD [place] [facility] [place],  [place] [address]  Chief Complaint  Patient presents with  . Routine Post Op    HPI: On August 31 patient had InterStim and it technically went very well.  She had mixed incontinence and 75% was an overactive bladder.  We talked the Botox in the past  Improved and has not changed settings.  Feels it vaginally.  Incisions look good.  She thinks he is infected and I called in Macrodantin 100 mg twice a day for 1 week  Change settings if needed.  Reassess in 8 weeks       PMH: Past Medical History:  Diagnosis Date  . Anemia    takes ferrous sulfate daily  . Anxiety   . Arthritis   . Bladder spasm   . Chronic back pain    spinal stenosis  . Colon cancer ([place])   . Complication of anesthesia    B/P bottoms out, hard to wake up   . Contact dermatitis   . COPD (chronic obstructive pulmonary disease) ([facility])    Albuterol inhaler and DuoNeb as needed  . Coronary artery disease    mild by 08/25/09 cath  . Foot drop, left   . Headache   . Heart murmur    never had any problems  . History of blood transfusion    no abnormal reaction noted  . History of bronchitis   . History of MRSA infection 6+ yrs ago  . Hypertension    takes Metoprolol daily  . Insomnia    takes Trazodone nightly  . Joint pain   . Joint swelling   . Lower extremity edema   . Nocturia   . Panic attacks    takes Effexor daily  . Peripheral edema    takes Lasix daily and takes Bumex every 3 days.Takes Aldactone daily   . Pulmonary emboli ([place]) 2018  . Restless leg syndrome    takes Requip daily  . RLS (restless legs syndrome)   . Sleep apnea    doesnt use CPAP    Surgical History: Past Surgical History:  Procedure Laterality Date  . ABDOMINAL HYSTERECTOMY    . BACK SURGERY    . CHOLECYSTECTOMY    . COLON SURGERY     partial  colectomy d/t cancer cells  . COLONOSCOPY    . I&D of left hip      x 4  . INTERSTIM IMPLANT PLACEMENT N/A 03/08/2019   Procedure: Barrie Lyme IMPLANT FIRST AND SECOND STAGE WITH IMPEDENCE CHECK;  Surgeon: Bjorn Loser, MD;  Location: [facility] ORS;  Service: Urology;  Laterality: N/A;  . JOINT REPLACEMENT    . Resection total hip    . Revision of total hip arthroplasty     x3-4  . right hip replacement    . TOTAL HIP ARTHROPLASTY Left     Home Medications:  Allergies as of 04/12/2019      Reactions   Hydrocodone-chlorpheniramine Shortness Of Breath   Sulfa Antibiotics Shortness Of Breath   Chlorhexidine Rash   Methadone Other (See Comments)   Altered Mental Status   Benadryl [diphenhydramine] Anxiety   Latex Rash   Tape Rash   "the white kind they use after surgery"      Medication List       Accurate as of April 12, 2019  3:47 PM. If you have any questions,  ask your nurse or doctor.        acetaminophen 500 MG tablet Commonly known as: TYLENOL Take 1,000 mg by mouth every 6 (six) hours as needed for moderate pain or headache.   albuterol 108 (90 Base) MCG/ACT inhaler Commonly known as: VENTOLIN HFA Inhale 2 puffs into the lungs every 4 (four) hours as needed for wheezing or shortness of breath.   ARIPiprazole 5 MG tablet Commonly known as: ABILIFY Take 5 mg by mouth at bedtime.   buPROPion 75 MG tablet Commonly known as: WELLBUTRIN Take 75 mg by mouth 2 (two) times daily. Unsure of dose   etodolac 400 MG tablet Commonly known as: LODINE Take 400 mg by mouth 2 (two) times daily.   fluticasone 50 MCG/ACT nasal spray Commonly known as: FLONASE Place 1 spray into both nostrils daily as needed (for sinus issues).   furosemide 20 MG tablet Commonly known as: Lasix Take 1 tablet (20 mg total) by mouth daily. What changed: when to take this   gabapentin 100 MG capsule Commonly known as: NEURONTIN Take 200 mg by mouth at bedtime.   ipratropium-albuterol  0.5-2.5 (3) MG/3ML Soln Commonly known as: DUONEB Take 3 mLs by nebulization every 6 (six) hours as needed (for wheezing/shortness of breath).   metoprolol succinate 50 MG 24 hr tablet Commonly known as: TOPROL-XL Take 1 tablet (50 mg total) by mouth every evening. Take with or immediately following a meal. What changed: when to take this   multivitamin with minerals Tabs tablet Take 2 tablets by mouth daily.   nitrofurantoin (macrocrystal-monohydrate) 100 MG capsule Commonly known as: Macrobid Take 1 capsule (100 mg total) by mouth 2 (two) times daily. Started by: Reece Packer, MD   oxybutynin 5 MG tablet Commonly known as: DITROPAN Take 5 mg by mouth 2 (two) times daily.   PARoxetine 40 MG tablet Commonly known as: PAXIL Take 40 mg by mouth daily at 2 PM.   rOPINIRole 3 MG tablet Commonly known as: REQUIP Take 1 tablet (3 mg total) by mouth 4 (four) times daily.   spironolactone 25 MG tablet Commonly known as: ALDACTONE Take 25 mg by mouth daily at 2 PM.       Allergies:  Allergies  Allergen Reactions  . Hydrocodone-Chlorpheniramine Shortness Of Breath  . Sulfa Antibiotics Shortness Of Breath  . Chlorhexidine Rash  . Methadone Other (See Comments)    Altered Mental Status  . Benadryl [Diphenhydramine] Anxiety  . Latex Rash  . Tape Rash    "the white kind they use after surgery"    Family History: Family History  Problem Relation Age of Onset  . Cancer Mother   . Stroke Father   . Diabetes Sister   . Mental illness Sister     Social History:  reports that she has been smoking cigarettes. She started smoking about 54 years ago. She has a 38.25 pack-year smoking history. She has never used smokeless tobacco. She reports that she does not drink alcohol or use drugs.  ROS: UROLOGY Frequent Urination?: No Hard to postpone urination?: No Burning/pain with urination?: Yes Get up at night to urinate?: No Leakage of urine?: No Urine stream starts and  stops?: No Trouble starting stream?: No Do you have to strain to urinate?: No Blood in urine?: No Urinary tract infection?: No Sexually transmitted disease?: No Injury to kidneys or bladder?: No Painful intercourse?: No Weak stream?: No Currently pregnant?: No Vaginal bleeding?: No Last menstrual period?: n  Gastrointestinal Nausea?: No Vomiting?:  No Indigestion/heartburn?: No Diarrhea?: No Constipation?: No  Constitutional Fever: No Night sweats?: No Weight loss?: No Fatigue?: No  Skin Skin rash/lesions?: No Itching?: No  Eyes Blurred vision?: No Double vision?: No  Ears/Nose/Throat Sore throat?: No Sinus problems?: No  Hematologic/Lymphatic Swollen glands?: No Easy bruising?: No  Cardiovascular Leg swelling?: No Chest pain?: No  Respiratory Cough?: No Shortness of breath?: No  Endocrine Excessive thirst?: No  Musculoskeletal Back pain?: No Joint pain?: No  Neurological Headaches?: No Dizziness?: No  Psychologic Depression?: No Anxiety?: No  Physical Exam: BP 139/81   Pulse 74   Ht 4\' 10"  (1.473 m)   Wt 80.7 kg   BMI 37.20 kg/m   Constitutional:  Alert and oriented, No acute distress.  Laboratory Data: Lab Results  Component Value Date   WBC 6.1 03/03/2019   HGB 13.4 03/03/2019   HCT 40.6 03/03/2019   MCV 95.3 03/03/2019   PLT 224 03/03/2019    Lab Results  Component Value Date   CREATININE 0.72 03/03/2019    No results found for: PSA  No results found for: TESTOSTERONE  Lab Results  Component Value Date   HGBA1C 5.2 12/29/2016    Urinalysis    Component Value Date/Time   COLORURINE YELLOW (A) 10/11/2018 2006   APPEARANCEUR Clear 02/26/2019 1440   LABSPEC 1.012 10/11/2018 2006   LABSPEC 1.009 03/15/2012 2143   PHURINE 5.0 10/11/2018 2006   GLUCOSEU Negative 02/26/2019 1440   GLUCOSEU Negative 03/15/2012 2143   HGBUR SMALL (A) 10/11/2018 2006   BILIRUBINUR Negative 02/26/2019 1440   BILIRUBINUR Negative  03/15/2012 2143   [place] NEGATIVE 10/11/2018 2006   PROTEINUR Negative 02/26/2019 1440   PROTEINUR NEGATIVE 10/11/2018 2006   NITRITE Negative 02/26/2019 1440   NITRITE NEGATIVE 10/11/2018 2006   LEUKOCYTESUR Negative 02/26/2019 1440   LEUKOCYTESUR NEGATIVE 10/11/2018 2006   LEUKOCYTESUR 1+ 03/15/2012 2143    Pertinent Imaging:   Assessment & Plan: Call in Macrodantin send culture and reassess 8 weeks  There are no diagnoses linked to this encounter.  Return in about 8 weeks (around 06/07/2019) for MD follow up.  Reece Packer, MD  [facility] [address] [place], [place] [address] [phone]

## 2019-04-13 LAB — URINALYSIS, COMPLETE
Bilirubin, UA: NEGATIVE
Glucose, UA: NEGATIVE
Ketones, UA: NEGATIVE
Nitrite, UA: POSITIVE — AB
Protein,UA: NEGATIVE
Specific Gravity, UA: >1.03 — ABNORMAL HIGH (ref 1.005–1.030)
Urobilinogen, Ur: 0.2 mg/dL (ref 0.2–1.0)
pH, UA: 5 (ref 5.0–7.5)

## 2019-04-13 LAB — MICROSCOPIC EXAMINATION: WBC, UA: >30 /hpf — AB (ref 0–5)

## 2019-04-16 LAB — CULTURE, URINE COMPREHENSIVE

## 2019-04-27 ENCOUNTER — Other Ambulatory Visit: Payer: Self-pay

## 2019-04-27 MED ORDER — OXYBUTYNIN CHLORIDE 5 MG PO TABS: 5.0000 mg | ORAL_TABLET | Freq: Two times a day (BID) | ORAL | 11 refills | Status: DC

## 2019-06-07 ENCOUNTER — Ambulatory Visit: Payer: Medicare HMO | Admitting: Urology

## 2019-06-28 ENCOUNTER — Ambulatory Visit: Payer: Medicare HMO | Admitting: Urology

## 2019-06-28 ENCOUNTER — Encounter: Payer: Self-pay | Admitting: Urology

## 2019-06-28 ENCOUNTER — Other Ambulatory Visit: Payer: Self-pay

## 2019-06-28 VITALS — BP 185/77 | HR 98 | Ht <= 58 in | Wt 178.0 lb

## 2019-06-28 DIAGNOSIS — N3946 Mixed incontinence: Secondary | ICD-10-CM | POA: Diagnosis not present

## 2019-06-28 LAB — URINALYSIS, COMPLETE
Bilirubin, UA: NEGATIVE
Glucose, UA: NEGATIVE
Ketones, UA: NEGATIVE
Leukocytes,UA: NEGATIVE
Nitrite, UA: NEGATIVE
Protein,UA: NEGATIVE
Specific Gravity, UA: >1.03 — ABNORMAL HIGH (ref 1.005–1.030)
Urobilinogen, Ur: 0.2 mg/dL (ref 0.2–1.0)
pH, UA: 5.5 (ref 5.0–7.5)

## 2019-06-28 LAB — MICROSCOPIC EXAMINATION: Epithelial Cells (non renal): >10 /hpf — AB (ref 0–10)

## 2019-06-28 MED ORDER — NITROFURANTOIN MONOHYD MACRO 100 MG PO CAPS: 100.0000 mg | ORAL_CAPSULE | Freq: Every day | ORAL | 11 refills | Status: DC

## 2019-06-28 NOTE — Progress Notes (Signed)
06/28/2019 3:29 PM   Ann Silva 29-Jun-1946 [id number]  Referring provider: Rusty Aus, MD [facility] [place],  [place] [address]  Chief Complaint  Patient presents with  . Follow-up    HPI: On August 31/.2020 patient had InterStim and it technically went very well.  She had mixed incontinence and 75% was an overactive bladder.  We talked the Botox in the past  Improved and has not changed settings.  Feels it vaginally.  Incisions look good.  She thinks he is infected and I called in Macrodantin 100 mg twice a day for 1 week  Change settings if needed.  Reassess in 8 weeks on daily Macrodantin suppression therapy  Today Frequency stable Urge incontinence dramatically better and very pleased.  No infections She bumped it and thought there was a lump but the examination was completely normal.  Reassurance given.  I will see her in 1 year   PMH: Past Medical History:  Diagnosis Date  . Anemia    takes ferrous sulfate daily  . Anxiety   . Arthritis   . Bladder spasm   . Chronic back pain    spinal stenosis  . Colon cancer ([place])   . Complication of anesthesia    B/P bottoms out, hard to wake up   . Contact dermatitis   . COPD (chronic obstructive pulmonary disease) (HCC)    Albuterol inhaler and DuoNeb as needed  . Coronary artery disease    mild by 08/25/09 cath  . Foot drop, left   . Headache   . Heart murmur    never had any problems  . History of blood transfusion    no abnormal reaction noted  . History of bronchitis   . History of MRSA infection 6+ yrs ago  . Hypertension    takes Metoprolol daily  . Insomnia    takes Trazodone nightly  . Joint pain   . Joint swelling   . Lower extremity edema   . Nocturia   . Panic attacks    takes Effexor daily  . Peripheral edema    takes Lasix daily and takes Bumex every 3 days.Takes Aldactone daily   . Pulmonary emboli ([place]) 2018  . Restless leg syndrome    takes Requip daily  . RLS (restless legs syndrome)   . Sleep apnea    doesnt use CPAP    Surgical History: Past Surgical History:  Procedure Laterality Date  . ABDOMINAL HYSTERECTOMY    . BACK SURGERY    . CHOLECYSTECTOMY    . COLON SURGERY     partial colectomy d/t cancer cells  . COLONOSCOPY    . I&D of left hip      x 4  . INTERSTIM IMPLANT PLACEMENT N/A 03/08/2019   Procedure: Barrie Lyme IMPLANT FIRST AND SECOND STAGE WITH IMPEDENCE CHECK;  Surgeon: Bjorn Loser, MD;  Location: [facility] ORS;  Service: Urology;  Laterality: N/A;  . JOINT REPLACEMENT    . Resection total hip    . Revision of total hip arthroplasty     x3-4  . right hip replacement    . TOTAL HIP ARTHROPLASTY Left     Home Medications:  Allergies as of 06/28/2019      Reactions   Hydrocodone-chlorpheniramine Shortness Of Breath   Sulfa Antibiotics Shortness Of Breath   Chlorhexidine Rash   Methadone Other (See Comments)   Altered Mental Status   Benadryl [diphenhydramine] Anxiety   Latex Rash  Tape Rash   "the white kind they use after surgery"      Medication List       Accurate as of June 28, 2019  3:29 PM. If you have any questions, ask your nurse or doctor.        acetaminophen 500 MG tablet Commonly known as: TYLENOL Take 1,000 mg by mouth every 6 (six) hours as needed for moderate pain or headache.   albuterol 108 (90 Base) MCG/ACT inhaler Commonly known as: VENTOLIN HFA Inhale 2 puffs into the lungs every 4 (four) hours as needed for wheezing or shortness of breath.   ARIPiprazole 5 MG tablet Commonly known as: ABILIFY Take 5 mg by mouth at bedtime.   buPROPion 75 MG tablet Commonly known as: WELLBUTRIN Take 75 mg by mouth 2 (two) times daily. Unsure of dose   etodolac 400 MG tablet Commonly known as: LODINE Take 400 mg by mouth 2 (two) times daily.   fluticasone 50 MCG/ACT nasal spray Commonly known as: FLONASE Place 1 spray into both nostrils daily as needed (for  sinus issues).   furosemide 20 MG tablet Commonly known as: Lasix Take 1 tablet (20 mg total) by mouth daily. What changed: when to take this   gabapentin 100 MG capsule Commonly known as: NEURONTIN Take 200 mg by mouth at bedtime.   ipratropium-albuterol 0.5-2.5 (3) MG/3ML Soln Commonly known as: DUONEB Take 3 mLs by nebulization every 6 (six) hours as needed (for wheezing/shortness of breath).   metoprolol succinate 50 MG 24 hr tablet Commonly known as: TOPROL-XL Take 1 tablet (50 mg total) by mouth every evening. Take with or immediately following a meal. What changed: when to take this   multivitamin with minerals Tabs tablet Take 2 tablets by mouth daily.   nitrofurantoin (macrocrystal-monohydrate) 100 MG capsule Commonly known as: Macrobid Take 1 capsule (100 mg total) by mouth 2 (two) times daily.   oxybutynin 5 MG tablet Commonly known as: DITROPAN Take 1 tablet (5 mg total) by mouth 2 (two) times daily.   PARoxetine 40 MG tablet Commonly known as: PAXIL Take 40 mg by mouth daily at 2 PM.   rOPINIRole 3 MG tablet Commonly known as: REQUIP Take 1 tablet (3 mg total) by mouth 4 (four) times daily.   spironolactone 25 MG tablet Commonly known as: ALDACTONE Take 25 mg by mouth daily at 2 PM.       Allergies:  Allergies  Allergen Reactions  . Hydrocodone-Chlorpheniramine Shortness Of Breath  . Sulfa Antibiotics Shortness Of Breath  . Chlorhexidine Rash  . Methadone Other (See Comments)    Altered Mental Status  . Benadryl [Diphenhydramine] Anxiety  . Latex Rash  . Tape Rash    "the white kind they use after surgery"    Family History: Family History  Problem Relation Age of Onset  . Cancer Mother   . Stroke Father   . Diabetes Sister   . Mental illness Sister     Social History:  reports that she has been smoking cigarettes. She started smoking about 55 years ago. She has a 38.25 pack-year smoking history. She has never used smokeless tobacco.  She reports that she does not drink alcohol or use drugs.  ROS:                                        Physical Exam: BP (!) 185/77  Pulse 98   Ht 4\' 10"  (1.473 m)   Wt 80.7 kg   BMI 37.20 kg/m   Constitutional:  Alert and oriented, No acute distress.   Laboratory Data: Lab Results  Component Value Date   WBC 6.1 03/03/2019   HGB 13.4 03/03/2019   HCT 40.6 03/03/2019   MCV 95.3 03/03/2019   PLT 224 03/03/2019    Lab Results  Component Value Date   CREATININE 0.72 03/03/2019    No results found for: PSA  No results found for: TESTOSTERONE  Lab Results  Component Value Date   HGBA1C 5.2 12/29/2016    Urinalysis    Component Value Date/Time   COLORURINE YELLOW (A) 10/11/2018 2006   APPEARANCEUR Cloudy (A) 04/12/2019 1628   LABSPEC 1.012 10/11/2018 2006   LABSPEC 1.009 03/15/2012 2143   PHURINE 5.0 10/11/2018 2006   GLUCOSEU Negative 04/12/2019 1628   GLUCOSEU Negative 03/15/2012 2143   HGBUR SMALL (A) 10/11/2018 2006   BILIRUBINUR Negative 04/12/2019 1628   BILIRUBINUR Negative 03/15/2012 2143   [place] NEGATIVE 10/11/2018 2006   PROTEINUR Negative 04/12/2019 1628   PROTEINUR NEGATIVE 10/11/2018 2006   NITRITE Positive (A) 04/12/2019 1628   NITRITE NEGATIVE 10/11/2018 2006   LEUKOCYTESUR Trace (A) 04/12/2019 1628   LEUKOCYTESUR NEGATIVE 10/11/2018 2006   LEUKOCYTESUR 1+ 03/15/2012 2143    Pertinent Imaging:   Assessment & Plan: Reassess 1 year  There are no diagnoses linked to this encounter.  No follow-ups on file.  Reece Packer, MD  [facility] [address] [place], [place] [address] [phone]

## 2019-06-28 NOTE — Addendum Note (Signed)
Addended by: [name] A on: 06/28/2019 03:49 PM   Modules accepted: Orders

## 2019-06-28 NOTE — Addendum Note (Signed)
Addended by: [name] A on: 06/28/2019 03:52 PM   Modules accepted: Orders

## 2019-07-29 ENCOUNTER — Ambulatory Visit: Payer: Medicare HMO | Attending: Internal Medicine

## 2019-07-29 DIAGNOSIS — Z23 Encounter for immunization: Secondary | ICD-10-CM | POA: Insufficient documentation

## 2019-07-29 NOTE — Progress Notes (Signed)
   Covid-19 Vaccination Clinic  Name:  PAMA FARO    MRN: [id number] DOB: May 20, 1946  07/29/2019  Ms. Shill was observed post Covid-19 immunization for 15 minutes without incidence. She was provided with Vaccine Information Sheet and instruction to access the V-Safe system.   Ms. Selvidge was instructed to call 911 with any severe reactions post vaccine: Marland Kitchen Difficulty breathing  . Swelling of your face and throat  . A fast heartbeat  . A bad rash all over your body  . Dizziness and weakness    Immunizations Administered    Name Date Dose VIS Date Route   Pfizer COVID-19 Vaccine 07/29/2019 10:04 AM 0.3 mL 06/18/2019 Intramuscular   Manufacturer: [place]   Lot: [id number]   [place]: [id number]

## 2019-08-09 ENCOUNTER — Other Ambulatory Visit: Payer: Medicare HMO

## 2019-08-19 ENCOUNTER — Ambulatory Visit: Payer: Medicare HMO | Attending: Internal Medicine

## 2019-08-19 DIAGNOSIS — Z23 Encounter for immunization: Secondary | ICD-10-CM | POA: Insufficient documentation

## 2019-08-19 NOTE — Progress Notes (Signed)
   Covid-19 Vaccination Clinic  Name:  Ann Silva    MRN: [id number] DOB: 1945-10-01  08/19/2019  Ann Silva was observed post Covid-19 immunization for 15 minutes without incidence. She was provided with Vaccine Information Sheet and instruction to access the V-Safe system.   Ann Silva was instructed to call 911 with any severe reactions post vaccine: Marland Kitchen Difficulty breathing  . Swelling of your face and throat  . A fast heartbeat  . A bad rash all over your body  . Dizziness and weakness    Immunizations Administered    Name Date Dose VIS Date Route   Pfizer COVID-19 Vaccine 08/19/2019 10:24 AM 0.3 mL 06/18/2019 Intramuscular   Manufacturer: [place]   Lot: [id number]   [place]: [id number]

## 2019-12-22 ENCOUNTER — Other Ambulatory Visit: Payer: Self-pay | Admitting: Internal Medicine

## 2019-12-22 DIAGNOSIS — R0609 Other forms of dyspnea: Secondary | ICD-10-CM

## 2019-12-22 DIAGNOSIS — J449 Chronic obstructive pulmonary disease, unspecified: Secondary | ICD-10-CM

## 2019-12-22 DIAGNOSIS — Z86711 Personal history of pulmonary embolism: Secondary | ICD-10-CM

## 2019-12-30 ENCOUNTER — Ambulatory Visit: Admission: RE | Admit: 2019-12-30 | Payer: Medicare HMO | Source: Ambulatory Visit

## 2019-12-31 ENCOUNTER — Ambulatory Visit
Admission: RE | Admit: 2019-12-31 | Discharge: 2019-12-31 | Disposition: A | Discharge status: Home / Self Care | Payer: Medicare HMO | Source: Ambulatory Visit | Attending: Internal Medicine | Admitting: Internal Medicine

## 2019-12-31 ENCOUNTER — Other Ambulatory Visit: Payer: Self-pay

## 2019-12-31 DIAGNOSIS — Z86711 Personal history of pulmonary embolism: Secondary | ICD-10-CM | POA: Diagnosis present

## 2019-12-31 DIAGNOSIS — R0600 Dyspnea, unspecified: Secondary | ICD-10-CM | POA: Insufficient documentation

## 2019-12-31 DIAGNOSIS — R0609 Other forms of dyspnea: Secondary | ICD-10-CM

## 2019-12-31 DIAGNOSIS — J449 Chronic obstructive pulmonary disease, unspecified: Secondary | ICD-10-CM | POA: Diagnosis present

## 2019-12-31 MED ORDER — IOHEXOL 350 MG/ML SOLN
75.0000 mL | Freq: Once | INTRAVENOUS | Status: AC | PRN
  Administered 2019-12-31: 75 mL via INTRAVENOUS

## 2020-01-03 LAB — POCT I-STAT CREATININE: Creatinine, Ser: 0.7 mg/dL (ref 0.44–1.00)

## 2020-03-09 ENCOUNTER — Other Ambulatory Visit: Payer: Self-pay | Admitting: Internal Medicine

## 2020-03-09 DIAGNOSIS — Z1231 Encounter for screening mammogram for malignant neoplasm of breast: Secondary | ICD-10-CM

## 2020-04-04 ENCOUNTER — Ambulatory Visit
Admission: RE | Admit: 2020-04-04 | Discharge: 2020-04-04 | Disposition: A | Discharge status: Home / Self Care | Payer: Medicare HMO | Source: Ambulatory Visit | Attending: Internal Medicine | Admitting: Internal Medicine

## 2020-04-04 ENCOUNTER — Other Ambulatory Visit: Payer: Self-pay

## 2020-04-04 DIAGNOSIS — Z1231 Encounter for screening mammogram for malignant neoplasm of breast: Secondary | ICD-10-CM | POA: Insufficient documentation

## 2020-04-10 ENCOUNTER — Other Ambulatory Visit: Payer: Self-pay | Admitting: *Deleted

## 2020-04-10 ENCOUNTER — Inpatient Hospital Stay
Admission: RE | Admit: 2020-04-10 | Discharge: 2020-04-10 | Disposition: A | Discharge status: Home / Self Care | Payer: Self-pay | Source: Ambulatory Visit | Attending: *Deleted | Admitting: *Deleted

## 2020-04-10 DIAGNOSIS — Z1231 Encounter for screening mammogram for malignant neoplasm of breast: Secondary | ICD-10-CM

## 2020-07-03 ENCOUNTER — Ambulatory Visit: Payer: Medicare HMO | Admitting: Urology

## 2020-09-18 ENCOUNTER — Ambulatory Visit: Payer: Medicare Other | Admitting: Urology

## 2020-09-19 ENCOUNTER — Encounter: Payer: Self-pay | Admitting: Urology

## 2020-12-11 ENCOUNTER — Emergency Department: Payer: Medicare Other

## 2020-12-11 ENCOUNTER — Encounter
Admission: EM | Disposition: A | Discharge status: Home / Self Care | Payer: Self-pay | Source: Home / Self Care | Attending: Emergency Medicine

## 2020-12-11 ENCOUNTER — Ambulatory Visit
Admission: EM | Admit: 2020-12-11 | Discharge: 2020-12-12 | Disposition: A | Discharge status: Home / Self Care | Payer: Medicare Other | Attending: Orthopedic Surgery | Admitting: Orthopedic Surgery

## 2020-12-11 ENCOUNTER — Ambulatory Visit: Payer: Medicare Other | Admitting: Anesthesiology

## 2020-12-11 ENCOUNTER — Ambulatory Visit: Payer: Medicare Other

## 2020-12-11 ENCOUNTER — Encounter: Payer: Self-pay | Admitting: Emergency Medicine

## 2020-12-11 ENCOUNTER — Other Ambulatory Visit: Payer: Self-pay

## 2020-12-11 DIAGNOSIS — Z9889 Other specified postprocedural states: Secondary | ICD-10-CM

## 2020-12-11 DIAGNOSIS — M25552 Pain in left hip: Secondary | ICD-10-CM | POA: Insufficient documentation

## 2020-12-11 DIAGNOSIS — Z86711 Personal history of pulmonary embolism: Secondary | ICD-10-CM | POA: Insufficient documentation

## 2020-12-11 DIAGNOSIS — F17210 Nicotine dependence, cigarettes, uncomplicated: Secondary | ICD-10-CM | POA: Insufficient documentation

## 2020-12-11 DIAGNOSIS — Z885 Allergy status to narcotic agent status: Secondary | ICD-10-CM | POA: Diagnosis not present

## 2020-12-11 DIAGNOSIS — Z96643 Presence of artificial hip joint, bilateral: Secondary | ICD-10-CM | POA: Diagnosis not present

## 2020-12-11 DIAGNOSIS — Z8614 Personal history of Methicillin resistant Staphylococcus aureus infection: Secondary | ICD-10-CM | POA: Insufficient documentation

## 2020-12-11 DIAGNOSIS — T84021A Dislocation of internal left hip prosthesis, initial encounter: Secondary | ICD-10-CM | POA: Insufficient documentation

## 2020-12-11 DIAGNOSIS — Z20822 Contact with and (suspected) exposure to covid-19: Secondary | ICD-10-CM | POA: Insufficient documentation

## 2020-12-11 DIAGNOSIS — Z882 Allergy status to sulfonamides status: Secondary | ICD-10-CM | POA: Diagnosis not present

## 2020-12-11 DIAGNOSIS — Z888 Allergy status to other drugs, medicaments and biological substances status: Secondary | ICD-10-CM | POA: Diagnosis not present

## 2020-12-11 DIAGNOSIS — S73005D Unspecified dislocation of left hip, subsequent encounter: Secondary | ICD-10-CM

## 2020-12-11 DIAGNOSIS — S73006A Unspecified dislocation of unspecified hip, initial encounter: Secondary | ICD-10-CM | POA: Diagnosis present

## 2020-12-11 DIAGNOSIS — S73005A Unspecified dislocation of left hip, initial encounter: Secondary | ICD-10-CM

## 2020-12-11 DIAGNOSIS — Y792 Prosthetic and other implants, materials and accessory orthopedic devices associated with adverse incidents: Secondary | ICD-10-CM | POA: Insufficient documentation

## 2020-12-11 HISTORY — PX: HIP CLOSED REDUCTION: SHX983

## 2020-12-11 LAB — RESP PANEL BY RT-PCR (FLU A&B, COVID) ARPGX2
Influenza A by PCR: NEGATIVE
Influenza B by PCR: NEGATIVE
SARS Coronavirus 2 by RT PCR: NEGATIVE

## 2020-12-11 SURGERY — CLOSED REDUCTION, HIP
Anesthesia: General | Site: Hip | Laterality: Left

## 2020-12-11 MED ORDER — FLUTICASONE PROPIONATE 50 MCG/ACT NA SUSP
1.0000 | Freq: Every day | NASAL | Status: DC | PRN
  Filled 2020-12-11: qty 16

## 2020-12-11 MED ORDER — FLUTICASONE-UMECLIDIN-VILANT 200-62.5-25 MCG/INH IN AEPB: 1.0000 | INHALATION_SPRAY | Freq: Every day | RESPIRATORY_TRACT | Status: DC

## 2020-12-11 MED ORDER — MIDAZOLAM HCL 2 MG/2ML IJ SOLN
INTRAMUSCULAR | Status: AC | PRN
  Administered 2020-12-11 (×2): 2 mg via INTRAVENOUS

## 2020-12-11 MED ORDER — ONDANSETRON HCL 4 MG PO TABS: 4.0000 mg | ORAL_TABLET | Freq: Four times a day (QID) | ORAL | Status: DC | PRN

## 2020-12-11 MED ORDER — METOCLOPRAMIDE HCL 10 MG PO TABS: 5.0000 mg | ORAL_TABLET | Freq: Three times a day (TID) | ORAL | Status: DC | PRN

## 2020-12-11 MED ORDER — FENTANYL CITRATE (PF) 100 MCG/2ML IJ SOLN: 25.0000 ug | INTRAMUSCULAR | Status: DC | PRN

## 2020-12-11 MED ORDER — IPRATROPIUM-ALBUTEROL 0.5-2.5 (3) MG/3ML IN SOLN: 3.0000 mL | Freq: Four times a day (QID) | RESPIRATORY_TRACT | Status: DC | PRN

## 2020-12-11 MED ORDER — METOPROLOL SUCCINATE ER 50 MG PO TB24
50.0000 mg | ORAL_TABLET | Freq: Every day | ORAL | Status: DC
  Administered 2020-12-12: 50 mg via ORAL
  Filled 2020-12-11 (×2): qty 1

## 2020-12-11 MED ORDER — ARIPIPRAZOLE 2 MG PO TABS
5.0000 mg | ORAL_TABLET | Freq: Every day | ORAL | Status: DC
  Administered 2020-12-11: 5 mg via ORAL
  Filled 2020-12-11: qty 3

## 2020-12-11 MED ORDER — FUROSEMIDE 20 MG PO TABS
20.0000 mg | ORAL_TABLET | Freq: Two times a day (BID) | ORAL | Status: DC
  Administered 2020-12-12: 20 mg via ORAL
  Filled 2020-12-11: qty 1

## 2020-12-11 MED ORDER — LIDOCAINE HCL (CARDIAC) PF 100 MG/5ML IV SOSY
PREFILLED_SYRINGE | INTRAVENOUS | Status: DC | PRN
  Administered 2020-12-11: 30 mg via INTRAVENOUS

## 2020-12-11 MED ORDER — METOCLOPRAMIDE HCL 5 MG/ML IJ SOLN: 5.0000 mg | Freq: Three times a day (TID) | INTRAMUSCULAR | Status: DC | PRN

## 2020-12-11 MED ORDER — FENTANYL CITRATE (PF) 100 MCG/2ML IJ SOLN
50.0000 ug | Freq: Once | INTRAMUSCULAR | Status: AC
Start: 2020-12-11 — End: 2020-12-11
  Administered 2020-12-11: 50 ug via INTRAVENOUS
  Filled 2020-12-11: qty 2

## 2020-12-11 MED ORDER — ALBUTEROL SULFATE (2.5 MG/3ML) 0.083% IN NEBU: 2.5000 mg | INHALATION_SOLUTION | RESPIRATORY_TRACT | Status: DC | PRN

## 2020-12-11 MED ORDER — METHOCARBAMOL 1000 MG/10ML IJ SOLN
500.0000 mg | Freq: Four times a day (QID) | INTRAVENOUS | Status: DC | PRN
  Filled 2020-12-11: qty 5

## 2020-12-11 MED ORDER — OXYCODONE HCL 5 MG PO TABS: 5.0000 mg | ORAL_TABLET | ORAL | Status: DC | PRN

## 2020-12-11 MED ORDER — MIDAZOLAM HCL 2 MG/2ML IJ SOLN
INTRAMUSCULAR | Status: AC
  Filled 2020-12-11: qty 4

## 2020-12-11 MED ORDER — FENTANYL CITRATE (PF) 100 MCG/2ML IJ SOLN
INTRAMUSCULAR | Status: AC
  Filled 2020-12-11: qty 2

## 2020-12-11 MED ORDER — FENTANYL CITRATE (PF) 100 MCG/2ML IJ SOLN
INTRAMUSCULAR | Status: AC | PRN
  Administered 2020-12-11 (×2): 50 ug via INTRAVENOUS

## 2020-12-11 MED ORDER — FLUTICASONE FUROATE-VILANTEROL 200-25 MCG/INH IN AEPB
1.0000 | INHALATION_SPRAY | Freq: Every day | RESPIRATORY_TRACT | Status: DC
  Administered 2020-12-12: 1 via RESPIRATORY_TRACT
  Filled 2020-12-11: qty 28

## 2020-12-11 MED ORDER — SPIRONOLACTONE 25 MG PO TABS
25.0000 mg | ORAL_TABLET | Freq: Every day | ORAL | Status: DC
  Administered 2020-12-12: 25 mg via ORAL
  Filled 2020-12-11: qty 1

## 2020-12-11 MED ORDER — ONDANSETRON HCL 4 MG/2ML IJ SOLN: 4.0000 mg | Freq: Once | INTRAMUSCULAR | Status: DC | PRN

## 2020-12-11 MED ORDER — HYDROMORPHONE HCL 1 MG/ML IJ SOLN: 0.5000 mg | INTRAMUSCULAR | Status: DC | PRN

## 2020-12-11 MED ORDER — PROPOFOL 10 MG/ML IV BOLUS
INTRAVENOUS | Status: DC | PRN
  Administered 2020-12-11: 150 mg via INTRAVENOUS

## 2020-12-11 MED ORDER — MONTELUKAST SODIUM 10 MG PO TABS
10.0000 mg | ORAL_TABLET | Freq: Every day | ORAL | Status: DC
  Administered 2020-12-11 – 2020-12-12 (×2): 10 mg via ORAL
  Filled 2020-12-11 (×2): qty 1

## 2020-12-11 MED ORDER — METHOCARBAMOL 500 MG PO TABS: 500.0000 mg | ORAL_TABLET | Freq: Four times a day (QID) | ORAL | Status: DC | PRN

## 2020-12-11 MED ORDER — PAROXETINE HCL 20 MG PO TABS
40.0000 mg | ORAL_TABLET | Freq: Every day | ORAL | Status: DC
  Administered 2020-12-12: 40 mg via ORAL
  Filled 2020-12-11: qty 2

## 2020-12-11 MED ORDER — ACETAMINOPHEN 500 MG PO TABS
1000.0000 mg | ORAL_TABLET | Freq: Four times a day (QID) | ORAL | Status: DC
  Administered 2020-12-11 – 2020-12-12 (×3): 1000 mg via ORAL
  Filled 2020-12-11 (×3): qty 2

## 2020-12-11 MED ORDER — UMECLIDINIUM BROMIDE 62.5 MCG/INH IN AEPB
1.0000 | INHALATION_SPRAY | Freq: Every day | RESPIRATORY_TRACT | Status: DC
  Administered 2020-12-12: 1 via RESPIRATORY_TRACT
  Filled 2020-12-11: qty 7

## 2020-12-11 MED ORDER — ROPINIROLE HCL 1 MG PO TABS
3.0000 mg | ORAL_TABLET | Freq: Four times a day (QID) | ORAL | Status: DC
  Administered 2020-12-11 – 2020-12-12 (×3): 3 mg via ORAL
  Filled 2020-12-11 (×3): qty 3

## 2020-12-11 MED ORDER — FENTANYL CITRATE (PF) 100 MCG/2ML IJ SOLN
INTRAMUSCULAR | Status: DC | PRN
  Administered 2020-12-11 (×2): 50 ug via INTRAVENOUS

## 2020-12-11 MED ORDER — SODIUM CHLORIDE 0.9 % IV SOLN: INTRAVENOUS | Status: DC

## 2020-12-11 MED ORDER — ACETAMINOPHEN 10 MG/ML IV SOLN: 1000.0000 mg | Freq: Once | INTRAVENOUS | Status: DC | PRN

## 2020-12-11 MED ORDER — LACTATED RINGERS IV SOLN: INTRAVENOUS | Status: DC | PRN

## 2020-12-11 MED ORDER — ONDANSETRON HCL 4 MG/2ML IJ SOLN: 4.0000 mg | Freq: Four times a day (QID) | INTRAMUSCULAR | Status: DC | PRN

## 2020-12-11 MED ORDER — SODIUM CHLORIDE 0.9 % IV BOLUS
500.0000 mL | Freq: Once | INTRAVENOUS | Status: AC
  Administered 2020-12-11: 500 mL via INTRAVENOUS

## 2020-12-11 MED ORDER — OXYCODONE HCL 5 MG PO TABS
10.0000 mg | ORAL_TABLET | ORAL | Status: DC | PRN
Start: 2020-12-11 — End: 2020-12-12

## 2020-12-11 MED ORDER — TEMAZEPAM 15 MG PO CAPS: 15.0000 mg | ORAL_CAPSULE | Freq: Every evening | ORAL | Status: DC | PRN

## 2020-12-11 MED ORDER — ACETAMINOPHEN 325 MG PO TABS: 325.0000 mg | ORAL_TABLET | Freq: Four times a day (QID) | ORAL | Status: DC | PRN

## 2020-12-11 SURGICAL SUPPLY — 13 items
COVER WAND RF STERILE (DRAPES) IMPLANT
DRSG EMULSION OIL 3X8 NADH (GAUZE/BANDAGES/DRESSINGS) IMPLANT
ELECT CAUTERY BLADE 6.4 (BLADE) IMPLANT
GLOVE SURG SYN 9.0  PF PI (GLOVE)
GLOVE SURG SYN 9.0 PF PI (GLOVE) IMPLANT
GLOVE SURG UNDER POLY LF SZ9 (GLOVE) IMPLANT
GOWN SRG 2XL LVL 4 RGLN SLV (GOWNS) IMPLANT
GOWN STRL NON-REIN 2XL LVL4 (GOWNS)
IMMBOLIZER KNEE 19 BLUE UNIV (SOFTGOODS) ×2 IMPLANT
KIT TURNOVER KIT A (KITS) IMPLANT
MANIFOLD NEPTUNE II (INSTRUMENTS) IMPLANT
PACK HIP PROSTHESIS (MISCELLANEOUS) IMPLANT
SCALPEL PROTECTED #10 DISP (BLADE) IMPLANT

## 2020-12-11 NOTE — ED Notes (Signed)
Report given to OR nurse

## 2020-12-11 NOTE — Anesthesia Postprocedure Evaluation (Signed)
Anesthesia Post Note  Patient: Ann Silva  Procedure(s) Performed: CLOSED REDUCTION HIP (Left Hip)  Patient location during evaluation: PACU Anesthesia Type: General Level of consciousness: awake and alert Pain management: pain level controlled Vital Signs Assessment: post-procedure vital signs reviewed and stable Respiratory status: spontaneous breathing, nonlabored ventilation, respiratory function stable and patient connected to nasal cannula oxygen Cardiovascular status: blood pressure returned to baseline and stable Postop Assessment: no apparent nausea or vomiting Anesthetic complications: no   No complications documented.   Last Vitals:  Vitals:   12/11/20 2151 12/11/20 2200  BP: (!) 117/91 123/72  Pulse: 72 65  Resp: 12 10  Temp: (!) 36.2 C   SpO2: 98% 98%    Last Pain:  Vitals:   12/11/20 2200  TempSrc:   PainSc: 0-No pain                 Arita Miss

## 2020-12-11 NOTE — Transfer of Care (Signed)
Immediate Anesthesia Transfer of Care Note  Patient: Ann Silva  Procedure(s) Performed: CLOSED REDUCTION HIP (Left Hip)  Patient Location: PACU  Anesthesia Type:General  Level of Consciousness: awake, alert , oriented and patient cooperative  Airway & Oxygen Therapy: Patient Spontanous Breathing and Patient connected to nasal cannula oxygen  Post-op Assessment: Report given to RN and Post -op Vital signs reviewed and stable  Post vital signs: Reviewed and stable  Last Vitals:  Vitals Value Taken Time  BP 117/91 12/11/20 2150  Temp    Pulse 67 12/11/20 2153  Resp 13 12/11/20 2153  SpO2 97 % 12/11/20 2153  Vitals shown include unvalidated device data.  Last Pain:  Vitals:   12/11/20 1833  TempSrc:   PainSc: 0-No pain         Complications: No complications documented.

## 2020-12-11 NOTE — Anesthesia Procedure Notes (Signed)
Procedure Name: LMA Insertion Date/Time: 12/11/2020 8:28 PM Performed by: Lendon Colonel, CRNA Pre-anesthesia Checklist: Patient identified, Patient being monitored, Timeout performed, Emergency Drugs available and Suction available Patient Re-evaluated:Patient Re-evaluated prior to induction Oxygen Delivery Method: Circle system utilized Preoxygenation: Pre-oxygenation with 100% oxygen Induction Type: IV induction Ventilation: Mask ventilation without difficulty LMA: LMA inserted Tube type: Oral Number of attempts: 1 Placement Confirmation: positive ETCO2 and breath sounds checked- equal and bilateral Tube secured with: Tape Dental Injury: Teeth and Oropharynx as per pre-operative assessment

## 2020-12-11 NOTE — ED Triage Notes (Signed)
Pt via EMS from home. Pt was sitting at home, bent over to pick up her dog and states when she was twisting to come back up she heard a pop in the R side. Pt is A&Ox4 and NAD. Pt has extensive ortho hx.

## 2020-12-11 NOTE — Op Note (Signed)
12/11/2020  9:40 PM  PATIENT:  Ann Silva  75 y.o. female  PRE-OPERATIVE DIAGNOSIS:  Dislocated hip S/P hip replacement left  POST-OPERATIVE DIAGNOSIS:  same  PROCEDURE:  Procedure(s): CLOSED REDUCTION HIP (Left)  SURGEON: Laurene Footman, MD  ASSISTANTS: None  ANESTHESIA:   general  EBL:  No intake/output data recorded.  BLOOD ADMINISTERED:none  DRAINS: none   LOCAL MEDICATIONS USED:  NONE  SPECIMEN:  No Specimen  DISPOSITION OF SPECIMEN:  N/A  COUNTS:  NO Closed procedure no count taken  TOURNIQUET:  * No tourniquets in log *  IMPLANTS: None  DICTATION: .Dragon Dictation patient was brought to the operating room and after adequate anesthesia was obtained appropriate patient identification and timeout procedures were completed.  After patient identification timeout were done C-arm was brought in and with the hip flexed and the knee flexed longitudinal traction was applied.  With fluoroscopic guidance discussed the hip the femoral head level with the acetabulum with additional traction and slid into the cup without a great sensation of reduction.  Oblique views were taken and the cup appeared to be reduced.  Not certain if this was a constrained liner and so the patient was placed on her left side lifted up and allowed to drop onto the left side to try to allow for impaction if it is a constrained liner.  This did not range of motion extreme flexion and internal rotation did not allow for dislocation is felt to stable reduction of been obtained.  A short knee immobilizer was applied on the left leg and patient sent to recovery in stable condition  PLAN OF CARE: Admit for overnight observation  PATIENT DISPOSITION:  PACU - hemodynamically stable.

## 2020-12-11 NOTE — ED Notes (Signed)
Dr. Siadecki at bedside 

## 2020-12-11 NOTE — Sedation Documentation (Signed)
Dr. Kerman Passey MD at bedside to attempt reduction.

## 2020-12-11 NOTE — ED Provider Notes (Signed)
[facility] Emergency Department Provider Note ____________________________________________   Event Date/Time   First MD Initiated Contact with Patient 12/11/20 1510     (approximate)  I have reviewed the triage vital signs and the nursing notes.   HISTORY  Chief Complaint Hip Pain    HPI Ann Silva is a 75 y.o. female with PMH as noted below including bilateral total hip arthroplasties who presents with left hip discomfort, cute onset this afternoon when the patient states that she was sitting in her recliner and leaned far forward to attend to her one of her dogs.  She felt a pop in her left hip and feels like it is dislocated.  She is unable to bear weight.  She states that this is happened multiple times before.  She denies any weakness or numbness.  She did not fall and denies other injuries.  Past Medical History:  Diagnosis Date  . Anemia    takes ferrous sulfate daily  . Anxiety   . Arthritis   . Bladder spasm   . Chronic back pain    spinal stenosis  . Colon cancer ([place])   . Complication of anesthesia    B/P bottoms out, hard to wake up   . Contact dermatitis   . COPD (chronic obstructive pulmonary disease) (HCC)    Albuterol inhaler and DuoNeb as needed  . Coronary artery disease    mild by 08/25/09 cath  . Foot drop, left   . Headache   . Heart murmur    never had any problems  . History of blood transfusion    no abnormal reaction noted  . History of bronchitis   . History of MRSA infection 6+ yrs ago  . Hypertension    takes Metoprolol daily  . Insomnia    takes Trazodone nightly  . Joint pain   . Joint swelling   . Lower extremity edema   . Nocturia   . Panic attacks    takes Effexor daily  . Peripheral edema    takes Lasix daily and takes Bumex every 3 days.Takes Aldactone daily   . Pulmonary emboli ([place]) 2018  . Restless leg syndrome    takes Requip daily  . RLS (restless legs syndrome)   . Sleep apnea     doesnt use CPAP    Patient Active Problem List   Diagnosis Date Noted  . Dislocated hip ([place]) 12/11/2020  . Chronic bilateral low back pain with left-sided sciatica (Primary Area of Pain) 03/12/2018  . Chronic pain of left lower extremity (Secondary Area of Pain) 03/12/2018  . Chronic pain syndrome 03/12/2018  . Long term current use of opiate analgesic 03/12/2018  . Pharmacologic therapy 03/12/2018  . Disorder of skeletal system 03/12/2018  . Problems influencing health status 03/12/2018  . Hyperparathyroidism, primary ([place]) 01/20/2018  . Medicare annual wellness visit, initial 09/02/2017  . Acute saddle pulmonary embolism with acute cor pulmonale ([place]) 02/20/2017  . Dyspnea 12/29/2016  . Acute congestive heart failure ([place])   . HCAP (healthcare-associated pneumonia)   . Pulmonary embolus ([place])   . Lumbar adjacent segment disease with spondylolisthesis 12/25/2016  . Aortic atherosclerosis ([place]) 08/14/2016  . Lymphedema of left leg 08/05/2016  . B12 deficiency 05/15/2016  . Chronic contact dermatitis 05/01/2016  . History of colon cancer 05/01/2016  . Mild chronic obstructive pulmonary disease ([place]) 05/01/2016  . COPD with acute exacerbation ([place]) 11/27/2015  . COPD exacerbation ([place]) 08/21/2015  . Spinal stenosis of lumbar region  04/02/2015  . Restless legs 02/27/2015  . Hypertension   . Tobacco use disorder 09/12/2014  . Anemia 06/17/2014  . Sedated due to medication 06/17/2014  . Obstructive sleep apnea 06/16/2014  . Benign essential hypertension 06/16/2014  . Restless legs syndrome 06/16/2014  . Colon cancer ([place])   . Depression   . History of total hip replacement, left 10/25/2013  . S/P total hip arthroplasty 10/25/2013    Past Surgical History:  Procedure Laterality Date  . ABDOMINAL HYSTERECTOMY    . BACK SURGERY    . BREAST EXCISIONAL BIOPSY Left 1980's   neg  . CHOLECYSTECTOMY    . COLON SURGERY     partial colectomy d/t cancer cells  . COLONOSCOPY    .  I&D of left hip      x 4  . INTERSTIM IMPLANT PLACEMENT N/A 03/08/2019   Procedure: Barrie Lyme IMPLANT FIRST AND SECOND STAGE WITH IMPEDENCE CHECK;  Surgeon: Bjorn Loser, MD;  Location: [facility] ORS;  Service: Urology;  Laterality: N/A;  . JOINT REPLACEMENT    . Resection total hip    . Revision of total hip arthroplasty     x3-4  . right hip replacement    . TOTAL HIP ARTHROPLASTY Left     Prior to Admission medications   Medication Sig Start Date End Date Taking? Authorizing Provider  acetaminophen (TYLENOL) 500 MG tablet Take 1,000 mg by mouth every 6 (six) hours as needed for moderate pain or headache.    [provider]  albuterol (PROVENTIL HFA;VENTOLIN HFA) 108 (90 Base) MCG/ACT inhaler Inhale 2 puffs into the lungs every 4 (four) hours as needed for wheezing or shortness of breath. 12/05/15   Loletha Grayer, MD  ARIPiprazole (ABILIFY) 5 MG tablet Take 5 mg by mouth at bedtime.    [provider]  buPROPion (WELLBUTRIN) 75 MG tablet Take 75 mg by mouth 2 (two) times daily. Unsure of dose    [provider]  etodolac (LODINE) 400 MG tablet Take 400 mg by mouth 2 (two) times daily.    [provider]  fluticasone (FLONASE) 50 MCG/ACT nasal spray Place 1 spray into both nostrils daily as needed (for sinus issues).     [provider]  furosemide (LASIX) 20 MG tablet Take 1 tablet (20 mg total) by mouth daily. Patient taking differently: Take 20 mg by mouth 2 (two) times daily.  04/29/16 02/28/26  Lavonia Drafts, MD  gabapentin (NEURONTIN) 100 MG capsule Take 200 mg by mouth at bedtime. 02/15/18   [provider]  ipratropium-albuterol (DUONEB) 0.5-2.5 (3) MG/3ML SOLN Take 3 mLs by nebulization every 6 (six) hours as needed (for wheezing/shortness of breath).    [provider]  metoprolol succinate (TOPROL-XL) 50 MG 24 hr tablet Take 1 tablet (50 mg total) by mouth every evening. Take with or immediately following a  meal. Patient taking differently: Take 50 mg by mouth daily at 2 PM. Take with or immediately following a meal. 12/26/15   Crissman, Jeannette How, MD  Multiple Vitamin (MULTIVITAMIN WITH MINERALS) TABS tablet Take 2 tablets by mouth daily.    [provider]  nitrofurantoin, macrocrystal-monohydrate, (MACROBID) 100 MG capsule Take 1 capsule (100 mg total) by mouth daily. 06/28/19   Bjorn Loser, MD  oxybutynin (DITROPAN) 5 MG tablet Take 1 tablet (5 mg total) by mouth 2 (two) times daily. 04/27/19   Bjorn Loser, MD  PARoxetine (PAXIL) 40 MG tablet Take 40 mg by mouth daily at 2 PM.  [provider]  rOPINIRole (REQUIP) 3 MG tablet Take 1 tablet (3 mg total) by mouth 4 (four) times daily. 04/20/15   [name], MD  spironolactone (ALDACTONE) 25 MG tablet Take 25 mg by mouth daily at 2 PM.     [provider]    Allergies Hydrocodone-chlorpheniramine, Sulfa antibiotics, Chlorhexidine, Methadone, Benadryl [diphenhydramine], Latex, and Tape  Family History  Problem Relation Age of Onset  . Cancer Mother   . Stroke Father   . Diabetes Sister   . Mental illness Sister     Social History Social History   Tobacco Use  . Smoking status: Current Every Day Smoker    Packs/day: 0.50    Years: 51.00    Pack years: 25.50    Types: Cigarettes    Start date: 07/08/1964  . Smokeless tobacco: Never Used  Vaping Use  . Vaping Use: Never used  Substance Use Topics  . Alcohol use: No  . Drug use: No    Review of Systems  Constitutional: No fever/chills Eyes: No visual changes. ENT: No sore throat. Cardiovascular: Denies chest pain. Respiratory: Denies shortness of breath. Gastrointestinal: No nausea, no vomiting.  No diarrhea.  Genitourinary: Negative for dysuria.  Musculoskeletal: Negative for back pain.  Positive for left hip pain. Skin: Negative for rash. Neurological: Negative for headaches, focal weakness or  numbness.   ____________________________________________   PHYSICAL EXAM:  VITAL SIGNS: ED Triage Vitals  Enc Vitals Group     BP 12/11/20 1518 124/64     Pulse Rate 12/11/20 1518 72     Resp 12/11/20 1518 13     Temp 12/11/20 1518 98.8 F (37.1 C)     Temp Source 12/11/20 1518 Oral     SpO2 12/11/20 1518 95 %     Weight 12/11/20 1513 176 lb (79.8 kg)     Height 12/11/20 1513 4\' 10"  (1.473 m)     Head Circumference --      Peak Flow --      Pain Score 12/11/20 1513 8     Pain Loc --      Pain Edu? --      Excl. in [place]? --     Constitutional: Alert and oriented. Well appearing and in no acute distress. Eyes: Conjunctivae are normal.  Head: Atraumatic. Nose: No congestion/rhinnorhea. Mouth/Throat: Mucous membranes are moist.   Neck: Normal range of motion.  Cardiovascular: Normal rate, regular rhythm.  Good peripheral circulation. Respiratory: Normal respiratory effort.  No retractions.  Gastrointestinal: No distention.  Musculoskeletal: No lower extremity edema.  Extremities warm and well perfused.  Left leg appears slightly shortened.  Pain on range of motion of left hip.  No deformity.  2+ DP pulse.   Neurologic:  Normal speech and language. No gross focal neurologic deficits are appreciated.  Motor and sensory intact in distal LLE. Skin:  Skin is warm and dry. No rash noted. Psychiatric: Mood and affect are normal. Speech and behavior are normal.  ____________________________________________   LABS (all labs ordered are listed, but only abnormal results are displayed)  Labs Reviewed  RESP PANEL BY RT-PCR (FLU A&B, COVID) [id number]   ____________________________________________  EKG   ____________________________________________  RADIOLOGY  XR left hip/pelvis: Superolateral dislocation XR left hip postreduction attempt: Persistent dislocation  ____________________________________________   PROCEDURES  Procedure(s) performed: Yes   .Ortho Injury  Treatment  Date/Time: 12/11/2020 7:24 PM Performed by: Arta Silence, MD Authorized by: Arta Silence, MD   Consent:    Consent  obtained:  Written   Consent given by:  Patient   Risks discussed:  Fracture, nerve damage, irreducible dislocation, recurrent dislocation and vascular damage   Alternatives discussed:  Delayed treatmentInjury location: hip Location details: left hip Injury type: dislocation Prosthesis: yes Pre-procedure neurovascular assessment: neurovascularly intact  Patient sedated: Yes. Refer to sedation procedure documentation for details of sedation. Manipulation performed: yes Reduction successful: no Post-procedure neurovascular assessment: post-procedure neurovascularly intact  .Sedation  Date/Time: 12/11/2020 7:25 PM Performed by: Arta Silence, MD Authorized by: Arta Silence, MD   Consent:    Consent obtained:  Written   Consent given by:  Patient   Risks discussed:  Allergic reaction, prolonged hypoxia resulting in organ damage, dysrhythmia, inadequate sedation, prolonged sedation necessitating reversal and respiratory compromise necessitating ventilatory assistance and intubation   Alternatives discussed:  Analgesia without sedation Universal protocol:    Immediately prior to procedure, a time out was called: yes   Indications:    Procedure performed:  Dislocation reduction Pre-sedation assessment:    Time since last food or drink:  6 hours   ASA classification: class 2 - patient with mild systemic disease     Mallampati score:  I - soft palate, uvula, fauces, pillars visible   Pre-sedation assessments completed and reviewed: airway patency, cardiovascular function, mental status, nausea/vomiting, pain level and respiratory function   Immediate pre-procedure details:    Reassessment: Patient reassessed immediately prior to procedure     Verified: bag valve mask available, emergency equipment available, intubation equipment  available, oxygen available and suction available   Procedure details (see MAR for exact dosages):    Preoxygenation:  Nasal cannula   Sedation:  Midazolam   Intended level of sedation: moderate (conscious sedation)   Analgesia:  Fentanyl   Intra-procedure monitoring:  Blood pressure monitoring, continuous capnometry, frequent LOC assessments, cardiac monitor, continuous pulse oximetry and frequent vital sign checks   Intra-procedure events: none     Total Provider sedation time (minutes):  10 Post-procedure details:    Attendance: Constant attendance by certified staff until patient recovered     Recovery: Patient returned to pre-procedure baseline     Patient is stable for discharge or admission: yes     Procedure completion:  Tolerated well, no immediate complications    Critical Care performed: No ____________________________________________   INITIAL IMPRESSION / ASSESSMENT AND PLAN / ED COURSE  Pertinent labs & imaging results that were available during my care of the patient were reviewed by me and considered in my medical decision making (see chart for details).  75 year old female with PMH as noted above including bilateral total hip arthroplasties presents with left hip pain after she leaned forward in a chair, and she is concerned it is dislocated.  She denies other injuries.  I reviewed the past medical records in [place].  The patient has multiple prior ED visits over the last few years but for unrelated symptoms, and I do not see any recent record since 2019 of a hip dislocation or reduction.  On exam the patient is overall well-appearing.  Her vital signs are normal.  She has pain on range of motion of the left hip and slight shortening of the left leg.    Overall presentation favors hip dislocation.  I have a low suspicion for fracture given that the patient did not fall or have any impact to the leg.  We will obtain an x-ray for further evaluation and discussed with  orthopedics as needed.  ----------------------------------------- 7:03 PM on 12/11/2020 -----------------------------------------  X-ray confirmed a dislocation.  I consulted Dr. Rudene Christians who recommended attempting a reduction under moderate sedation in the ED.  We attempted reduction under moderate sedation unsuccessfully.  The patient tolerated the attempt well with no complications.  I discussed again with Dr. Rudene Christians who has admitted the patient to do a reduction under anesthesia in the operating room.  ____________________________________________   FINAL CLINICAL IMPRESSION(S) / ED DIAGNOSES  Final diagnoses:  Dislocation of left hip, initial encounter ([place])      NEW MEDICATIONS STARTED DURING THIS VISIT:  New Prescriptions   No medications on file     Note:  This document was prepared using Dragon voice recognition software and may include unintentional dictation errors.   Arta Silence, MD 12/11/20 [phone]

## 2020-12-11 NOTE — H&P (Signed)
Subjective:   Patient is a 75 y.o. female presents with left hip pain. Onset of symptoms was abrupt starting 7 hours ago with unchanged course since that time. The pain is located in the hip.  She bent over and felt the hip pop out of place.  She has had this happen in the past but not for over 5 years.  She reports 10 prior surgeries on the left hip with total hip replacement having infection.  She has not had any recent pain in the hip and has been doing well.  Patient Active Problem List   Diagnosis Date Noted  . Dislocated hip ([place]) 12/11/2020  . Chronic bilateral low back pain with left-sided sciatica (Primary Area of Pain) 03/12/2018  . Chronic pain of left lower extremity (Secondary Area of Pain) 03/12/2018  . Chronic pain syndrome 03/12/2018  . Long term current use of opiate analgesic 03/12/2018  . Pharmacologic therapy 03/12/2018  . Disorder of skeletal system 03/12/2018  . Problems influencing health status 03/12/2018  . Hyperparathyroidism, primary ([place]) 01/20/2018  . Medicare annual wellness visit, initial 09/02/2017  . Acute saddle pulmonary embolism with acute cor pulmonale ([place]) 02/20/2017  . Dyspnea 12/29/2016  . Acute congestive heart failure ([place])   . HCAP (healthcare-associated pneumonia)   . Pulmonary embolus ([place])   . Lumbar adjacent segment disease with spondylolisthesis 12/25/2016  . Aortic atherosclerosis ([place]) 08/14/2016  . Lymphedema of left leg 08/05/2016  . B12 deficiency 05/15/2016  . Chronic contact dermatitis 05/01/2016  . History of colon cancer 05/01/2016  . Mild chronic obstructive pulmonary disease ([place]) 05/01/2016  . COPD with acute exacerbation ([place]) 11/27/2015  . COPD exacerbation ([place]) 08/21/2015  . Spinal stenosis of lumbar region 04/02/2015  . Restless legs 02/27/2015  . Hypertension   . Tobacco use disorder 09/12/2014  . Anemia 06/17/2014  . Sedated due to medication 06/17/2014  . Obstructive sleep apnea 06/16/2014  . Benign essential  hypertension 06/16/2014  . Restless legs syndrome 06/16/2014  . Colon cancer ([place])   . Depression   . History of total hip replacement, left 10/25/2013  . S/P total hip arthroplasty 10/25/2013   Past Medical History:  Diagnosis Date  . Anemia    takes ferrous sulfate daily  . Anxiety   . Arthritis   . Bladder spasm   . Chronic back pain    spinal stenosis  . Colon cancer ([place])   . Complication of anesthesia    B/P bottoms out, hard to wake up   . Contact dermatitis   . COPD (chronic obstructive pulmonary disease) (HCC)    Albuterol inhaler and DuoNeb as needed  . Coronary artery disease    mild by 08/25/09 cath  . Foot drop, left   . Headache   . Heart murmur    never had any problems  . History of blood transfusion    no abnormal reaction noted  . History of bronchitis   . History of MRSA infection 6+ yrs ago  . Hypertension    takes Metoprolol daily  . Insomnia    takes Trazodone nightly  . Joint pain   . Joint swelling   . Lower extremity edema   . Nocturia   . Panic attacks    takes Effexor daily  . Peripheral edema    takes Lasix daily and takes Bumex every 3 days.Takes Aldactone daily   . Pulmonary emboli ([place]) 2018  . Restless leg syndrome    takes Requip daily  . RLS (restless  legs syndrome)   . Sleep apnea    doesnt use CPAP    Past Surgical History:  Procedure Laterality Date  . ABDOMINAL HYSTERECTOMY    . BACK SURGERY    . BREAST EXCISIONAL BIOPSY Left 1980's   neg  . CHOLECYSTECTOMY    . COLON SURGERY     partial colectomy d/t cancer cells  . COLONOSCOPY    . I&D of left hip      x 4  . INTERSTIM IMPLANT PLACEMENT N/A 03/08/2019   Procedure: Barrie Lyme IMPLANT FIRST AND SECOND STAGE WITH IMPEDENCE CHECK;  Surgeon: Bjorn Loser, MD;  Location: [facility] ORS;  Service: Urology;  Laterality: N/A;  . JOINT REPLACEMENT    . Resection total hip    . Revision of total hip arthroplasty     x3-4  . right hip replacement    . TOTAL HIP ARTHROPLASTY  Left     (Not in a hospital admission)  Allergies  Allergen Reactions  . Hydrocodone-Chlorpheniramine Shortness Of Breath  . Sulfa Antibiotics Shortness Of Breath  . Chlorhexidine Rash  . Methadone Other (See Comments)    Altered Mental Status  . Benadryl [Diphenhydramine] Anxiety  . Latex Rash  . Tape Rash    "the white kind they use after surgery"    Social History   Tobacco Use  . Smoking status: Current Every Day Smoker    Packs/day: 0.50    Years: 51.00    Pack years: 25.50    Types: Cigarettes    Start date: 07/08/1964  . Smokeless tobacco: Never Used  Substance Use Topics  . Alcohol use: No    Family History  Problem Relation Age of Onset  . Cancer Mother   . Stroke Father   . Diabetes Sister   . Mental illness Sister     Review of Systems Pertinent items are noted in HPI.  Objective:   Patient Vitals for the past 8 hrs:  BP Temp Temp src Pulse Resp SpO2 Height Weight  12/11/20 1830 (!) 110/57 -- -- 62 15 98 % -- --  12/11/20 1805 (!) 92/48 -- -- (!) 59 16 96 % -- --  12/11/20 1800 (!) 93/46 -- -- (!) 58 15 98 % -- --  12/11/20 1755 (!) 103/54 -- -- 62 12 97 % -- --  12/11/20 1750 (!) 91/44 -- -- (!) 58 17 95 % -- --  12/11/20 1747 (!) 96/40 -- -- 61 17 95 % -- --  12/11/20 1747 (!) 96/40 -- -- (!) 58 18 95 % -- --  12/11/20 1745 (!) 103/54 -- -- 72 15 98 % -- --  12/11/20 1735 (!) 149/84 -- -- 73 16 95 % -- --  12/11/20 1728 115/64 -- -- 71 14 95 % -- --  12/11/20 1700 136/71 -- -- 65 13 94 % -- --  12/11/20 1600 (!) 125/57 -- -- 62 17 92 % -- --  12/11/20 1530 121/81 -- -- 63 11 96 % -- --  12/11/20 1518 124/64 98.8 F (37.1 C) Oral 72 13 95 % -- --  12/11/20 1513 -- -- -- -- -- -- 4\' 10"  (1.473 m) 79.8 kg   No intake/output data recorded. Total I/O In: 500 [IV Piggyback:500] Out: -     BP (!) 110/57   Pulse 62   Temp 98.8 F (37.1 C) (Oral)   Resp 15   Ht 4\' 10"  (1.473 m)   Wt 79.8 kg   SpO2 98%   BMI  36.78 kg/m  General appearance:  alert, appears stated age and moderate distress Lungs: clear to auscultation bilaterally Heart: regular rate and rhythm, S1, S2 normal, no murmur, click, rub or gallop Extremities: Left leg is slightly flexed and internally rotated with significant edema in both lower extremities and palpable pulse.   Assessment:   Active Problems:   Dislocated hip (HCC)   Plan:   After attempted in ER under sedation that was unsuccessful plan is to bring patient to the operating room for close reduction with fluoroscopy to aid in getting alignment and reduction.  I did discuss potential risks especially potential for fracture or inability to reduce the the dislocation in which case we would stop it since we do not have any parts that could be revised if needed if we were to do an open reduction.  She understands this.

## 2020-12-11 NOTE — Anesthesia Preprocedure Evaluation (Signed)
Anesthesia Evaluation  Patient identified by MRN, date of birth, ID band Patient awake    Reviewed: Allergy & Precautions, NPO status , Patient's Chart, lab work & pertinent test results, reviewed documented beta blocker date and time   History of Anesthesia Complications Negative for: history of anesthetic complications  Airway Mallampati: III  TM Distance: >3 FB Neck ROM: Full    Dental  (+) Upper Dentures, Lower Dentures Patient says her dentures are firmly glued in:   Pulmonary shortness of breath, sleep apnea , neg pneumonia , COPD,  COPD inhaler, Current SmokerPatient did not abstain from smoking.,    breath sounds clear to auscultation       Cardiovascular Exercise Tolerance: Poor hypertension, Pt. on medications and Pt. on home beta blockers + CAD and +CHF  + Valvular Problems/Murmurs  Rhythm:Regular Rate:Normal  TTE 2018: - Left ventricle: The cavity size was normal. Wall thickness was  increased in a pattern of mild LVH. Systolic function was normal.  The estimated ejection fraction was in the range of 55% to 60%.  Wall motion was normal; there were no regional wall motion  abnormalities. Left ventricular diastolic function parameters  were normal.  - Left atrium: The atrium was mildly dilated.  - Right atrium: The atrium was mildly dilated.    Neuro/Psych  Headaches, PSYCHIATRIC DISORDERS Anxiety Depression  Neuromuscular disease    GI/Hepatic negative GI ROS, Neg liver ROS,   Endo/Other  negative endocrine ROS  Renal/GU      Musculoskeletal  (+) Arthritis ,   Abdominal   Peds  Hematology  (+) anemia ,   Anesthesia Other Findings Past Medical History: No date: Anemia     Comment:  takes ferrous sulfate daily No date: Anxiety No date: Arthritis No date: Bladder spasm No date: Chronic back pain     Comment:  spinal stenosis No date: Colon cancer ([place]) No date: Complication of  anesthesia     Comment:  B/P bottoms out, hard to wake up  No date: Contact dermatitis No date: COPD (chronic obstructive pulmonary disease) (HCC)     Comment:  Albuterol inhaler and DuoNeb as needed No date: Coronary artery disease     Comment:  mild by 08/25/09 cath No date: Foot drop, left No date: Headache No date: Heart murmur     Comment:  never had any problems No date: History of blood transfusion     Comment:  no abnormal reaction noted No date: History of bronchitis 6+ yrs ago: History of MRSA infection No date: Hypertension     Comment:  takes Metoprolol daily No date: Insomnia     Comment:  takes Trazodone nightly No date: Joint pain No date: Joint swelling No date: Lower extremity edema No date: Nocturia No date: Panic attacks     Comment:  takes Effexor daily No date: Peripheral edema     Comment:  takes Lasix daily and takes Bumex every 3 days.Takes               Aldactone daily  2018: Pulmonary emboli (HCC) No date: Restless leg syndrome     Comment:  takes Requip daily No date: RLS (restless legs syndrome) No date: Sleep apnea     Comment:  doesnt use CPAP   Reproductive/Obstetrics                            Anesthesia Physical  Anesthesia Plan  ASA: III  Anesthesia Plan:  General   Post-op Pain Management:    Induction: Intravenous  PONV Risk Score and Plan: 2 and Ondansetron, Dexamethasone and Treatment may vary due to age or medical condition  Airway Management Planned: Mask and LMA  Additional Equipment: None  Intra-op Plan:   Post-operative Plan: Extubation in OR  Informed Consent: I have reviewed the patients History and Physical, chart, labs and discussed the procedure including the risks, benefits and alternatives for the proposed anesthesia with the patient or authorized representative who has indicated his/her understanding and acceptance.     Dental advisory given  Plan Discussed with: CRNA  Anesthesia  Plan Comments: (Discussed risks of anesthesia with patient, including PONV, sore throat, lip/dental damage. Rare risks discussed as well, such as cardiorespiratory and neurological sequelae. Patient understands.)       Anesthesia Quick Evaluation

## 2020-12-12 ENCOUNTER — Encounter: Payer: Self-pay | Admitting: Orthopedic Surgery

## 2020-12-12 NOTE — Discharge Instructions (Signed)
Diet: As you were doing prior to hospitalization   Dressing:  You may change your dressing as needed. Change the dressing with sterile gauze dressing.    Activity:  Increase activity slowly as tolerated, but follow the weight bearing instructions below.   Weight Bearing:   Weight bearing as tolerated to left lower extremity. Continue with Knee immobilizer to left lower extremity x 6 weeks  Precautions:  If you experience chest pain or shortness of breath - call 911 immediately for transfer to the hospital emergency department!!  If you develop a fever greater that 101 F, purulent drainage from wound, increased redness or drainage from wound, or calf pain-Call [place]                                               Follow- Up Appointment:  Please call for an appointment to be seen in 2 weeks at [facility]

## 2020-12-12 NOTE — Plan of Care (Signed)
  Problem: Education: Goal: Knowledge of General Education information will improve Description: Including pain rating scale, medication(s)/side effects and non-pharmacologic comfort measures Outcome: Progressing   Problem: Pain Managment: Goal: General experience of comfort will improve Outcome: Progressing   Problem: Safety: Goal: Ability to remain free from injury will improve Outcome: Progressing   

## 2020-12-12 NOTE — Evaluation (Signed)
Physical Therapy Evaluation Patient Details Name: Ann Silva MRN: [id number] DOB: [birth year] Today's Date: 12/12/2020   History of Present Illness  75 y/o female here with L hip dislocation.  She has a complicated history re: L hip as she had repalcement, early dislocation, infection, later dislocations (most recently ~5 years ago).  Reduced 6/6.  Clinical Impression  Pt did well with all aspects of PT exam.  She did not recall standard posterior hip precautions and PT spent much of session educating and reinforcing these and the importance of maintaining them.  She did well with mobility, ambulation, stair negotiation and generally had no issues with safely going home.  Pt assures me she will be cognizant of her positioning and awareness of precautions.       Follow Up Recommendations Follow surgeon's recommendation for DC plan and follow-up therapies    Equipment Recommendations  None recommended by PT    Recommendations for Other Services       Precautions / Restrictions Precautions Precautions: Posterior Hip;Fall Required Braces or Orthoses: Knee Immobilizer - Left Restrictions Weight Bearing Restrictions: Yes LLE Weight Bearing: Weight bearing as tolerated      Mobility  Bed Mobility Overal bed mobility: Modified Independent             General bed mobility comments: cues for precautions    Transfers Overall transfer level: Modified independent Equipment used: Rolling walker (2 wheeled)             General transfer comment: minimal cuing for UE use and precautions, able to rise w/o hesitation  Ambulation/Gait Ambulation/Gait assistance: Supervision Gait Distance (Feet): 250 Feet Assistive device: Rolling walker (2 wheeled);None       General Gait Details: Pt able to ambulate safely and with good confidence.  She showed very little hesitancy and after ~100 ft with the walker was safe and confident to walk the remainder w/o AD.  KI donned t/o the  effort, pt with appropriate vitals and no increased pain.  Stairs Stairs: Yes Stairs assistance: Supervision Stair Management: Two rails;Step to pattern;Forwards Number of Stairs: 4 General stair comments: Pt recalled appropriate strategy/sequencing form prior surgeries, good safety and confidence.  Wheelchair Mobility    Modified Rankin (Stroke Patients Only)       Balance Overall balance assessment: Modified Independent                                           Pertinent Vitals/Pain Pain Assessment: No/denies pain    Home Living Family/patient expects to be discharged to:: Private residence Living Arrangements: Spouse/significant other Available Help at Discharge: Family;Available 24 hours/day   Home Access: Stairs to enter Entrance Stairs-Rails: Can reach both Entrance Stairs-Number of Steps: 3   Home Equipment: Cane - single point (9YI)      Prior Function Level of Independence: Independent         Comments: Pt not out of the home a lot but reports she can do all ADLs, etc w/o issue.  Did encourage her to use adaptive equip/ask for assist to maintain precautions     Hand Dominance        Extremity/Trunk Assessment   Upper Extremity Assessment Upper Extremity Assessment: Overall WFL for tasks assessed    Lower Extremity Assessment Lower Extremity Assessment: Overall WFL for tasks assessed       Communication   Communication: No difficulties  Cognition Arousal/Alertness: Awake/alert Behavior During Therapy: WFL for tasks assessed/performed Overall Cognitive Status: Within Functional Limits for tasks assessed                                        General Comments General comments (skin integrity, edema, etc.): Pt needed cuing to recall percautions initially, able to report independently at end of session.  Seems to have good awareness and respect for them now - educated extensively on the importance of  maintaining these given her history of multiple dislocations.    Exercises     Assessment/Plan    PT Assessment Patient needs continued PT services  PT Problem List Decreased strength;Decreased activity tolerance;Decreased knowledge of precautions;Decreased safety awareness;Decreased knowledge of use of DME;Decreased mobility;Decreased range of motion       PT Treatment Interventions Functional mobility training;Gait training;Stair training;DME instruction;Therapeutic activities;Therapeutic exercise;Balance training;Patient/family education    PT Goals (Current goals can be found in the Care Plan section)  Acute Rehab PT Goals Patient Stated Goal: go home PT Goal Formulation: With patient Time For Goal Achievement: 12/26/20 Potential to Achieve Goals: Good    Frequency Min 2X/week   Barriers to discharge        Co-evaluation               AM-PAC PT "6 Clicks" Mobility  Outcome Measure Help needed turning from your back to your side while in a flat bed without using bedrails?: None Help needed moving from lying on your back to sitting on the side of a flat bed without using bedrails?: None Help needed moving to and from a bed to a chair (including a wheelchair)?: None Help needed standing up from a chair using your arms (e.g., wheelchair or bedside chair)?: None Help needed to walk in hospital room?: None Help needed climbing 3-5 steps with a railing? : None 6 Click Score: 24    End of Session Equipment Utilized During Treatment: Gait belt Activity Tolerance: Patient tolerated treatment well Patient left: with call bell/phone within reach;with chair alarm set Nurse Communication: Mobility status PT Visit Diagnosis: Muscle weakness (generalized) (M62.81);Difficulty in walking, not elsewhere classified (R26.2)    Time: 1245-8099 PT Time Calculation (min) (ACUTE ONLY): 29 min   Charges:   PT Evaluation $PT Eval Low Complexity: 1 Low PT Treatments $Gait  Training: 8-22 mins        Kreg Shropshire, DPT 12/12/2020, 10:24 AM

## 2020-12-12 NOTE — Plan of Care (Signed)
  Problem: Education: Goal: Knowledge of General Education information will improve Description: Including pain rating scale, medication(s)/side effects and non-pharmacologic comfort measures 12/12/2020 1417 by Jules Schick, RN Outcome: Completed/Met 12/12/2020 1001 by Jules Schick, RN Outcome: Progressing   Problem: Pain Managment: Goal: General experience of comfort will improve 12/12/2020 1417 by Jules Schick, RN Outcome: Completed/Met 12/12/2020 1001 by Jules Schick, RN Outcome: Progressing   Problem: Safety: Goal: Ability to remain free from injury will improve 12/12/2020 1417 by Jules Schick, RN Outcome: Completed/Met 12/12/2020 1001 by Jules Schick, RN Outcome: Progressing

## 2020-12-12 NOTE — Discharge Summary (Signed)
Physician Discharge Summary  Patient ID: RHODA WALDVOGEL MRN: [id number] DOB/AGE: 09/01/1945 75 y.o.  Admit date: 12/11/2020 Discharge date: 12/12/2020  Admission Diagnoses:  Dislocated hip ([place]) [S73.006A] Dislocation of left hip, initial encounter ([place]) [S73.005A] Dislocation closed, hip, left, subsequent encounter [S73.005D] Dislocation closed, hip, left, initial encounter ([place]) [S73.005A] S/P closed reduction of dislocated total hip prosthesis [Z98.890]   Discharge Diagnoses: Patient Active Problem List   Diagnosis Date Noted  . Dislocated hip ([place]) 12/11/2020  . S/P closed reduction of dislocated total hip prosthesis 12/11/2020  . Chronic bilateral low back pain with left-sided sciatica (Primary Area of Pain) 03/12/2018  . Chronic pain of left lower extremity (Secondary Area of Pain) 03/12/2018  . Chronic pain syndrome 03/12/2018  . Long term current use of opiate analgesic 03/12/2018  . Pharmacologic therapy 03/12/2018  . Disorder of skeletal system 03/12/2018  . Problems influencing health status 03/12/2018  . Hyperparathyroidism, primary ([place]) 01/20/2018  . Medicare annual wellness visit, initial 09/02/2017  . Acute saddle pulmonary embolism with acute cor pulmonale ([place]) 02/20/2017  . Dyspnea 12/29/2016  . Acute congestive heart failure ([place])   . HCAP (healthcare-associated pneumonia)   . Pulmonary embolus ([place])   . Lumbar adjacent segment disease with spondylolisthesis 12/25/2016  . Aortic atherosclerosis ([place]) 08/14/2016  . Lymphedema of left leg 08/05/2016  . B12 deficiency 05/15/2016  . Chronic contact dermatitis 05/01/2016  . History of colon cancer 05/01/2016  . Mild chronic obstructive pulmonary disease ([place]) 05/01/2016  . COPD with acute exacerbation ([place]) 11/27/2015  . COPD exacerbation ([place]) 08/21/2015  . Spinal stenosis of lumbar region 04/02/2015  . Restless legs 02/27/2015  . Hypertension   . Tobacco use disorder 09/12/2014  . Anemia 06/17/2014  .  Sedated due to medication 06/17/2014  . Obstructive sleep apnea 06/16/2014  . Benign essential hypertension 06/16/2014  . Restless legs syndrome 06/16/2014  . Colon cancer ([place])   . Depression   . History of total hip replacement, left 10/25/2013  . S/P total hip arthroplasty 10/25/2013    Past Medical History:  Diagnosis Date  . Anemia    takes ferrous sulfate daily  . Anxiety   . Arthritis   . Bladder spasm   . Chronic back pain    spinal stenosis  . Colon cancer ([place])   . Complication of anesthesia    B/P bottoms out, hard to wake up   . Contact dermatitis   . COPD (chronic obstructive pulmonary disease) (HCC)    Albuterol inhaler and DuoNeb as needed  . Coronary artery disease    mild by 08/25/09 cath  . Foot drop, left   . Headache   . Heart murmur    never had any problems  . History of blood transfusion    no abnormal reaction noted  . History of bronchitis   . History of MRSA infection 6+ yrs ago  . Hypertension    takes Metoprolol daily  . Insomnia    takes Trazodone nightly  . Joint pain   . Joint swelling   . Lower extremity edema   . Nocturia   . Panic attacks    takes Effexor daily  . Peripheral edema    takes Lasix daily and takes Bumex every 3 days.Takes Aldactone daily   . Pulmonary emboli ([place]) 2018  . Restless leg syndrome    takes Requip daily  . RLS (restless legs syndrome)   . Sleep apnea    doesnt use CPAP     Transfusion:  none   Consultants (if any):   Discharged Condition: Improved  Hospital Course: CHIMENE SALO is an 75 y.o. female who was admitted 12/11/2020 with a diagnosis of left total hip dislocation and went to the operating room on 12/11/2020 and underwent the above named procedures.    Surgeries: Procedure(s): CLOSED REDUCTION HIP on 12/11/2020 Patient tolerated the surgery well. Taken to PACU where she was stabilized and then transferred to the orthopedic floor.  Patient's pain was well controlled on the orthopedic  floor.  Vital signs are stable.  Patient was able to ambulate 175 feet on postop day 1 with physical therapy.  Patient maintain knee immobilizer and was able to move around safely and independently.  Patient was stable and ready for discharge to home.  She will follow-up with our office in 2 weeks   She was given perioperative antibiotics:  Anti-infectives (From admission, onward)   None    .   She benefited maximally from the hospital stay and there were no complications.    Recent vital signs:  Vitals:   12/12/20 0748 12/12/20 1152  BP: 124/78 (!) 115/55  Pulse: 63 63  Resp: 17 18  Temp: 98.3 F (36.8 C) 98 F (36.7 C)  SpO2: 96% 93%    Recent laboratory studies:  Lab Results  Component Value Date   HGB 13.4 03/03/2019   HGB 13.5 10/11/2018   HGB 13.6 06/25/2018   Lab Results  Component Value Date   WBC 6.1 03/03/2019   PLT 224 03/03/2019   Lab Results  Component Value Date   INR 1.19 12/28/2016   Lab Results  Component Value Date   NA 140 03/03/2019   K 3.8 03/03/2019   CL 105 03/03/2019   CO2 24 03/03/2019   BUN 13 03/03/2019   CREATININE 0.70 12/31/2019   GLUCOSE 99 03/03/2019    Discharge Medications:   Allergies as of 12/12/2020      Reactions   Hydrocodone-chlorpheniramine Shortness Of Breath   Sulfa Antibiotics Shortness Of Breath   Chlorhexidine Rash   Methadone Other (See Comments)   Altered Mental Status   Benadryl [diphenhydramine] Anxiety   Latex Rash   Tape Rash   "the white kind they use after surgery"      Medication List    TAKE these medications   acetaminophen 500 MG tablet Commonly known as: TYLENOL Take 1,000 mg by mouth every 6 (six) hours as needed for moderate pain or headache.   albuterol 108 (90 Base) MCG/ACT inhaler Commonly known as: VENTOLIN HFA Inhale 2 puffs into the lungs every 4 (four) hours as needed for wheezing or shortness of breath.   ARIPiprazole 5 MG tablet Commonly known as: ABILIFY Take 5 mg by  mouth at bedtime.   buPROPion 75 MG tablet Commonly known as: WELLBUTRIN Take 75 mg by mouth 2 (two) times daily. Unsure of dose   etodolac 400 MG tablet Commonly known as: LODINE Take 400 mg by mouth 2 (two) times daily.   fluticasone 50 MCG/ACT nasal spray Commonly known as: FLONASE Place 1 spray into both nostrils daily as needed (for sinus issues).   furosemide 20 MG tablet Commonly known as: Lasix Take 1 tablet (20 mg total) by mouth daily. What changed: when to take this   gabapentin 100 MG capsule Commonly known as: NEURONTIN Take 200 mg by mouth at bedtime.   ipratropium-albuterol 0.5-2.5 (3) MG/3ML Soln Commonly known as: DUONEB Take 3 mLs by nebulization every 6 (six) hours as  needed (for wheezing/shortness of breath).   metoprolol succinate 50 MG 24 hr tablet Commonly known as: TOPROL-XL Take 1 tablet (50 mg total) by mouth every evening. Take with or immediately following a meal. What changed: when to take this   montelukast 10 MG tablet Commonly known as: SINGULAIR Take 10 mg by mouth daily.   multivitamin with minerals Tabs tablet Take 2 tablets by mouth daily.   nystatin 100000 UNIT/ML suspension Commonly known as: MYCOSTATIN Take 5 mLs by mouth daily at 12 noon.   PARoxetine 40 MG tablet Commonly known as: PAXIL Take 40 mg by mouth daily at 2 PM.   rOPINIRole 3 MG tablet Commonly known as: REQUIP Take 1 tablet (3 mg total) by mouth 4 (four) times daily.   spironolactone 25 MG tablet Commonly known as: ALDACTONE Take 25 mg by mouth daily at 2 PM.   temazepam 15 MG capsule Commonly known as: RESTORIL Take 15 mg by mouth at bedtime as needed for sleep.   Trelegy Ellipta 200-62.5-25 MCG/INH Aepb Generic drug: Fluticasone-Umeclidin-Vilant Inhale 1 puff into the lungs daily.       Diagnostic Studies: DG Hip [place] or [place] Pelvis 1 View Left  Result Date: 12/11/2020 CLINICAL DATA:  Post reduction attempt EXAM: DG HIP (WITH OR WITHOUT  PELVIS) 1V PORT LEFT COMPARISON:  12/11/2020 left hip radiographs FINDINGS: Status post left total hip arthroplasty. Persistent superolateral dislocation of the left femoral head prosthesis at the left hip joint. Multiple cerclage wire fragments again surround the proximal left femur. No bone fracture. No focal osseous lesions. Stimulator lead terminates over the right sacrum. IMPRESSION: Persistent superolateral dislocation of the left femoral head prosthesis at the left hip joint. Electronically Signed   By: Ilona Sorrel M.D.   On: 12/11/2020 18:14   DG HIP OPERATIVE UNILAT W OR W/O PELVIS LEFT  Result Date: 12/11/2020 CLINICAL DATA:  Closed reduction of left hip. EXAM: OPERATIVE LEFT HIP (WITH PELVIS IF PERFORMED) TECHNIQUE: Fluoroscopic spot image(s) were submitted for interpretation post-operatively. COMPARISON:  Radiographs earlier today. FINDINGS: Four fluoroscopic spot views of the left hip obtained in the operating room. The femoral component is no seated in the acetabular component. Total fluoroscopy time not provided. IMPRESSION: Procedural fluoroscopy for reduction of left hip arthroplasty dislocation. Electronically Signed   By: Keith Rake M.D.   On: 12/11/2020 22:09   DG Hip Unilat With Pelvis 2-3 Views Left  Result Date: 12/11/2020 CLINICAL DATA:  History of left hip dislocation.  Felt a pop. EXAM: DG HIP (WITH OR WITHOUT PELVIS) 2-3V LEFT COMPARISON:  04/09/2018 FINDINGS: Status post bilateral hip arthroplasty. Chronically fractured wires involving the proximal portion of the left femoral component. Posterior and superolateral dislocation of the femoral component. No complicating fracture. Osteopenia.  Presacral stimulator.  Lumbar spine fixation. IMPRESSION: Left hip arthroplasty with femoral component dislocation, as detailed above. Electronically Signed   By: Abigail Miyamoto M.D.   On: 12/11/2020 16:01    Disposition:      Follow-up Information    Hessie Knows, MD Follow up in 2  week(s).   Specialty: Orthopedic Surgery Contact information: [address] [place] [address] [phone]                Signed: Feliberto Gottron 12/12/2020, 12:34 PM

## 2020-12-12 NOTE — Progress Notes (Signed)
Pt discharged home with husband, all instructions provided and questions answered.   BP 121/70   Pulse 79   Temp 98.2 F (36.8 C) (Oral)   Resp 16   Ht 4\' 10"  (1.473 m)   Wt 79.8 kg   SpO2 91%   BMI 36.78 kg/m

## 2020-12-12 NOTE — Progress Notes (Signed)
   Subjective: 1 Day Post-Op Procedure(s) (LRB): CLOSED REDUCTION HIP (Left) Patient reports pain as mild.   Patient is well, and has had no acute complaints or problems Denies any CP, SOB, ABD pain. We will continue therapy today.  Plan is to go Home after hospital stay.  Objective: Vital signs in last 24 hours: Temp:  [97.2 F (36.2 C)-98.8 F (37.1 C)] 98 F (36.7 C) (06/07 1152) Pulse Rate:  [58-87] 63 (06/07 1152) Resp:  [10-18] 18 (06/07 1152) BP: (91-149)/(40-97) 115/55 (06/07 1152) SpO2:  [90 %-100 %] 93 % (06/07 1152) Weight:  [79.8 kg] 79.8 kg (06/06 1513)  Intake/Output from previous day: 06/06 0701 - 06/07 0700 In: 1068.3 [I.V.:568.3; IV Piggyback:500] Out: 450 [Urine:450] Intake/Output this shift: Total I/O In: 480 [P.O.:480] Out: -   No results for input(s): HGB in the last 72 hours. No results for input(s): WBC, RBC, HCT, PLT in the last 72 hours. No results for input(s): NA, K, CL, CO2, BUN, CREATININE, GLUCOSE, CALCIUM in the last 72 hours. No results for input(s): LABPT, INR in the last 72 hours.  EXAM General - Patient is Alert, Appropriate and Oriented Extremity - Neurovascular intact Sensation intact distally Intact pulses distally Dorsiflexion/Plantar flexion intact No cellulitis present Compartment soft Left knee immobilizer intact Motor Function - intact, moving foot and toes well on exam.   Past Medical History:  Diagnosis Date  . Anemia    takes ferrous sulfate daily  . Anxiety   . Arthritis   . Bladder spasm   . Chronic back pain    spinal stenosis  . Colon cancer ([place])   . Complication of anesthesia    B/P bottoms out, hard to wake up   . Contact dermatitis   . COPD (chronic obstructive pulmonary disease) (HCC)    Albuterol inhaler and DuoNeb as needed  . Coronary artery disease    mild by 08/25/09 cath  . Foot drop, left   . Headache   . Heart murmur    never had any problems  . History of blood transfusion    no  abnormal reaction noted  . History of bronchitis   . History of MRSA infection 6+ yrs ago  . Hypertension    takes Metoprolol daily  . Insomnia    takes Trazodone nightly  . Joint pain   . Joint swelling   . Lower extremity edema   . Nocturia   . Panic attacks    takes Effexor daily  . Peripheral edema    takes Lasix daily and takes Bumex every 3 days.Takes Aldactone daily   . Pulmonary emboli ([place]) 2018  . Restless leg syndrome    takes Requip daily  . RLS (restless legs syndrome)   . Sleep apnea    doesnt use CPAP    Assessment/Plan:   1 Day Post-Op Procedure(s) (LRB): CLOSED REDUCTION HIP (Left) Active Problems:   Dislocated hip (HCC)   S/P closed reduction of dislocated total hip prosthesis  Estimated body mass index is 36.78 kg/m as calculated from the following:   Height as of this encounter: 4\' 10"  (1.473 m).   Weight as of this encounter: 79.8 kg. Advance diet Up with therapy with knee immobilizer Patient with out pain, hip pain resolved. Patient performed well with PT today and is safe to return home. She will follow up with [facility] in 2 weeks   T. Rachelle Hora, PA-C [place] 12/12/2020, 12:29 PM

## 2021-10-01 ENCOUNTER — Encounter: Payer: Self-pay | Admitting: Ophthalmology

## 2021-10-01 ENCOUNTER — Other Ambulatory Visit: Payer: Self-pay

## 2021-10-04 NOTE — Discharge Instructions (Signed)

## 2021-10-09 ENCOUNTER — Ambulatory Visit: Payer: Medicare PPO | Admitting: Anesthesiology

## 2021-10-09 ENCOUNTER — Encounter
Admission: RE | Disposition: A | Discharge status: Home / Self Care | Payer: Self-pay | Source: Home / Self Care | Attending: Ophthalmology

## 2021-10-09 ENCOUNTER — Encounter: Payer: Self-pay | Admitting: Ophthalmology

## 2021-10-09 ENCOUNTER — Other Ambulatory Visit: Payer: Self-pay

## 2021-10-09 ENCOUNTER — Ambulatory Visit
Admission: RE | Admit: 2021-10-09 | Discharge: 2021-10-09 | Disposition: A | Discharge status: Home / Self Care | Payer: Medicare PPO | Attending: Ophthalmology | Admitting: Ophthalmology

## 2021-10-09 DIAGNOSIS — F17210 Nicotine dependence, cigarettes, uncomplicated: Secondary | ICD-10-CM | POA: Insufficient documentation

## 2021-10-09 DIAGNOSIS — I2510 Atherosclerotic heart disease of native coronary artery without angina pectoris: Secondary | ICD-10-CM | POA: Insufficient documentation

## 2021-10-09 DIAGNOSIS — I10 Essential (primary) hypertension: Secondary | ICD-10-CM | POA: Diagnosis not present

## 2021-10-09 DIAGNOSIS — F419 Anxiety disorder, unspecified: Secondary | ICD-10-CM | POA: Insufficient documentation

## 2021-10-09 DIAGNOSIS — J449 Chronic obstructive pulmonary disease, unspecified: Secondary | ICD-10-CM | POA: Insufficient documentation

## 2021-10-09 DIAGNOSIS — G4730 Sleep apnea, unspecified: Secondary | ICD-10-CM | POA: Diagnosis not present

## 2021-10-09 DIAGNOSIS — F32A Depression, unspecified: Secondary | ICD-10-CM | POA: Insufficient documentation

## 2021-10-09 DIAGNOSIS — H2511 Age-related nuclear cataract, right eye: Secondary | ICD-10-CM | POA: Diagnosis not present

## 2021-10-09 HISTORY — PX: CATARACT EXTRACTION W/PHACO: SHX586

## 2021-10-09 SURGERY — PHACOEMULSIFICATION, CATARACT, WITH IOL INSERTION
Anesthesia: Monitor Anesthesia Care | Site: Eye | Laterality: Right

## 2021-10-09 MED ORDER — ACETAMINOPHEN 325 MG PO TABS: 325.0000 mg | ORAL_TABLET | ORAL | Status: DC | PRN

## 2021-10-09 MED ORDER — TETRACAINE HCL 0.5 % OP SOLN
1.0000 [drp] | OPHTHALMIC | Status: DC | PRN
  Administered 2021-10-09 (×3): 1 [drp] via OPHTHALMIC

## 2021-10-09 MED ORDER — MIDAZOLAM HCL 2 MG/2ML IJ SOLN
INTRAMUSCULAR | Status: DC | PRN
  Administered 2021-10-09: 1 mg via INTRAVENOUS

## 2021-10-09 MED ORDER — FENTANYL CITRATE (PF) 100 MCG/2ML IJ SOLN
INTRAMUSCULAR | Status: DC | PRN
  Administered 2021-10-09: 50 ug via INTRAVENOUS

## 2021-10-09 MED ORDER — ONDANSETRON HCL 4 MG/2ML IJ SOLN: 4.0000 mg | Freq: Once | INTRAMUSCULAR | Status: DC | PRN

## 2021-10-09 MED ORDER — BRIMONIDINE TARTRATE-TIMOLOL 0.2-0.5 % OP SOLN
OPHTHALMIC | Status: DC | PRN
Start: 2021-10-09 — End: 2021-10-09
  Administered 2021-10-09: 1 [drp] via OPHTHALMIC

## 2021-10-09 MED ORDER — ACETAMINOPHEN 160 MG/5ML PO SOLN: 325.0000 mg | ORAL | Status: DC | PRN

## 2021-10-09 MED ORDER — ARMC OPHTHALMIC DILATING DROPS
1.0000 "application " | OPHTHALMIC | Status: DC | PRN
  Administered 2021-10-09 (×3): 1 via OPHTHALMIC

## 2021-10-09 MED ORDER — SIGHTPATH DOSE#1 BSS IO SOLN
INTRAOCULAR | Status: DC | PRN
  Administered 2021-10-09: 15 mL via INTRAOCULAR

## 2021-10-09 MED ORDER — SIGHTPATH DOSE#1 BSS IO SOLN
INTRAOCULAR | Status: DC | PRN
  Administered 2021-10-09: 2 mL

## 2021-10-09 MED ORDER — SIGHTPATH DOSE#1 NA CHONDROIT SULF-NA HYALURON 40-17 MG/ML IO SOLN
INTRAOCULAR | Status: DC | PRN
  Administered 2021-10-09: 1 mL via INTRAOCULAR

## 2021-10-09 MED ORDER — MOXIFLOXACIN HCL 0.5 % OP SOLN
OPHTHALMIC | Status: DC | PRN
  Administered 2021-10-09: 0.2 mL via OPHTHALMIC

## 2021-10-09 MED ORDER — SIGHTPATH DOSE#1 BSS IO SOLN
INTRAOCULAR | Status: DC | PRN
  Administered 2021-10-09: 83 mL via OPHTHALMIC

## 2021-10-09 SURGICAL SUPPLY — 12 items
CANNULA ANT/CHMB 27G (MISCELLANEOUS) IMPLANT
CANNULA ANT/CHMB 27GA (MISCELLANEOUS) IMPLANT
CATARACT SUITE SIGHTPATH (MISCELLANEOUS) ×2 IMPLANT
FEE CATARACT SUITE SIGHTPATH (MISCELLANEOUS) ×1 IMPLANT
GLOVE SURG ENC TEXT LTX SZ8 (GLOVE) ×2 IMPLANT
GLOVE SURG TRIUMPH 8.0 PF LTX (GLOVE) ×2 IMPLANT
LENS IOL TECNIS EYHANCE 22.5 (Intraocular Lens) ×1 IMPLANT
NDL FILTER BLUNT 18X1 1/2 (NEEDLE) ×1 IMPLANT
NEEDLE FILTER BLUNT 18X 1/2SAF (NEEDLE) ×1
NEEDLE FILTER BLUNT 18X1 1/2 (NEEDLE) ×1 IMPLANT
SYR 3ML LL SCALE MARK (SYRINGE) ×2 IMPLANT
WATER STERILE IRR 250ML POUR (IV SOLUTION) ×2 IMPLANT

## 2021-10-09 NOTE — Transfer of Care (Signed)
Immediate Anesthesia Transfer of Care Note ? ?Patient: Ann Silva ? ?Procedure(s) Performed: CATARACT EXTRACTION PHACO AND INTRAOCULAR LENS PLACEMENT (IOC) RIGHT 23.94 01:52.0 (Right: Eye) ? ?Patient Location: PACU ? ?Anesthesia Type: MAC ? ?Level of Consciousness: awake, alert  and patient cooperative ? ?Airway and Oxygen Therapy: Patient Spontanous Breathing and Patient connected to supplemental oxygen ? ?Post-op Assessment: Post-op Vital signs reviewed, Patient's Cardiovascular Status Stable, Respiratory Function Stable, Patent Airway and No signs of Nausea or vomiting ? ?Post-op Vital Signs: Reviewed and stable ? ?Complications: No notable events documented. ? ?

## 2021-10-09 NOTE — Anesthesia Postprocedure Evaluation (Signed)
Anesthesia Post Note ? ?Patient: Ann Silva ? ?Procedure(s) Performed: CATARACT EXTRACTION PHACO AND INTRAOCULAR LENS PLACEMENT (IOC) RIGHT 23.94 01:52.0 (Right: Eye) ? ? ?  ?Patient location during evaluation: PACU ?Anesthesia Type: MAC ?Level of consciousness: awake ?Pain management: pain level controlled ?Vital Signs Assessment: post-procedure vital signs reviewed and stable ?Respiratory status: respiratory function stable ?Cardiovascular status: stable ?Postop Assessment: no apparent nausea or vomiting ?Anesthetic complications: no ? ? ?No notable events documented. ? ?Veda Canning ? ? ? ? ? ?

## 2021-10-09 NOTE — Anesthesia Procedure Notes (Signed)
Procedure Name: [place] ?Date/Time: 10/09/2021 10:55 AM ?Performed by: Jeannene Patella, CRNA ?Pre-anesthesia Checklist: Patient identified, Emergency Drugs available, Suction available, Timeout performed and Patient being monitored ?Patient Re-evaluated:Patient Re-evaluated prior to induction ?Oxygen Delivery Method: Nasal cannula ?Placement Confirmation: positive ETCO2 ? ? ? ? ?

## 2021-10-09 NOTE — Anesthesia Preprocedure Evaluation (Signed)
Anesthesia Evaluation  ?Patient identified by MRN, date of birth, ID band ?Patient awake ? ? ? ?Reviewed: ?Allergy & Precautions, NPO status  ? ?Airway ?Mallampati: II ? ?TM Distance: >3 FB ? ? ? ? Dental ?  ?Pulmonary ?shortness of breath, sleep apnea (no cpap) , COPD, Current Smoker and Patient abstained from smoking.,  ?  ?Pulmonary exam normal ? ? ? ? ? ? ? Cardiovascular ?hypertension, + CAD  ? ?Rhythm:Regular Rate:Normal ? ?TTE 2021 ?NORMAL LEFT VENTRICULAR SYSTOLIC FUNCTION  ?NORMAL RIGHT VENTRICULAR SYSTOLIC FUNCTION  ?TRIVIAL REGURGITATION NOTED  ?NO VALVULAR STENOSIS  ? ?  ?Neuro/Psych ? Headaches, PSYCHIATRIC DISORDERS Anxiety Depression Restless leg syndrome ?Insomnia ?  ? GI/Hepatic ?  ?Endo/Other  ? ? Renal/GU ?  ? ?  ?Musculoskeletal ? ?(+) Arthritis ,  ? Abdominal ?  ?Peds ? Hematology ?  ?Anesthesia Other Findings ? ? Reproductive/Obstetrics ? ?  ? ? ? ? ? ? ? ? ? ? ? ? ? ?  ?  ? ? ? ? ? ? ? ? ?Anesthesia Physical ?Anesthesia Plan ? ?ASA: 3 ? ?Anesthesia Plan: MAC  ? ?Post-op Pain Management: Minimal or no pain anticipated  ? ?Induction: Intravenous ? ?PONV Risk Score and Plan: TIVA, Midazolam and Treatment may vary due to age or medical condition ? ?Airway Management Planned: Natural Airway and Nasal Cannula ? ?Additional Equipment:  ? ?Intra-op Plan:  ? ?Post-operative Plan:  ? ?Informed Consent: I have reviewed the patients History and Physical, chart, labs and discussed the procedure including the risks, benefits and alternatives for the proposed anesthesia with the patient or authorized representative who has indicated his/her understanding and acceptance.  ? ? ? ? ? ?Plan Discussed with: CRNA ? ?Anesthesia Plan Comments:   ? ? ? ? ? ? ?Anesthesia Quick Evaluation ? ?

## 2021-10-09 NOTE — Op Note (Signed)
PREOPERATIVE DIAGNOSIS:  Nuclear sclerotic cataract of the right eye. ?  ?POSTOPERATIVE DIAGNOSIS:  Cataract ?  ?OPERATIVE PROCEDURE:ORPROCALL@ ?  ?SURGEON:  Birder Robson, MD. ?  ?ANESTHESIA: ? ?Anesthesiologist: Veda Canning, MD ?CRNA: Jeannene Patella, CRNA ? ?1.      Managed anesthesia care. ?2.      0.61m of Shugarcaine was instilled in the eye following the paracentesis. ?  ?COMPLICATIONS:  None. ?  ?TECHNIQUE:   Stop and chop ?  ?DESCRIPTION OF PROCEDURE:  The patient was examined and consented in the preoperative holding area where the aforementioned topical anesthesia was applied to the right eye and then brought back to the Operating Room where the right eye was prepped and draped in the usual sterile ophthalmic fashion and a lid speculum was placed. A paracentesis was created with the side port blade and the anterior chamber was filled with viscoelastic. A near clear corneal incision was performed with the steel keratome. A continuous curvilinear capsulorrhexis was performed with a cystotome followed by the capsulorrhexis forceps. Hydrodissection and hydrodelineation were carried out with BSS on a blunt cannula. The lens was removed in a stop and chop  technique and the remaining cortical material was removed with the irrigation-aspiration handpiece. The capsular bag was inflated with viscoelastic and the Technis ZCB00  lens was placed in the capsular bag without complication. The remaining viscoelastic was removed from the eye with the irrigation-aspiration handpiece. The wounds were hydrated. The anterior chamber was flushed with BSS and the eye was inflated to physiologic pressure. 0.155mof Vigamox was placed in the anterior chamber. The wounds were found to be water tight. The eye was dressed with Combigan. The patient was given protective glasses to wear throughout the day and a shield with which to sleep tonight. The patient was also given drops with which to begin a drop regimen today and  will follow-up with me in one day. ?Implant Name Type Inv. Item Serial No. Manufacturer Lot No. LRB No. Used Action  ?LENS IOL TECNIS EYHANCE 22.5 - [id number] Lens LENS IOL TECNIS EYHANCE 22.5 [id number]  Right 1 Implanted  ? ?Procedure(s): ?CATARACT EXTRACTION PHACO AND INTRAOCULAR LENS PLACEMENT (IOC) RIGHT 23.94 01:52.0 (Right) ? ?Electronically signed: WiBirder Robson/10/2021 11:14 AM ? ?

## 2021-10-09 NOTE — H&P (Signed)
[facility]  ? ?Primary Care Physician:  Rusty Aus, MD ?Ophthalmologist: Dr. George Ina ? ?Pre-Procedure History & Physical: ?HPI:  Ann Silva is a 76 y.o. female here for cataract surgery. ?  ?Past Medical History:  ?Diagnosis Date  ? Anemia   ? takes ferrous sulfate daily  ? Anxiety   ? Arthritis   ? Bladder spasm   ? Chronic back pain   ? spinal stenosis  ? Colon cancer ([place])   ? Complication of anesthesia   ? B/P bottoms out, hard to wake up   ? Contact dermatitis   ? COPD (chronic obstructive pulmonary disease) ([place])   ? Albuterol inhaler and DuoNeb as needed  ? Coronary artery disease   ? mild by 08/25/09 cath  ? Foot drop, left   ? Headache   ? Heart murmur   ? never had any problems  ? History of blood transfusion   ? no abnormal reaction noted  ? History of bronchitis   ? History of MRSA infection 6+ yrs ago  ? Hypertension   ? takes Metoprolol daily  ? Insomnia   ? takes Trazodone nightly  ? Joint pain   ? Joint swelling   ? Lower extremity edema   ? Nocturia   ? Panic attacks   ? takes Effexor daily  ? Peripheral edema   ? takes Lasix daily and takes Bumex every 3 days.Takes Aldactone daily   ? Pulmonary emboli ([place]) 2018  ? Restless leg syndrome   ? takes Requip daily  ? RLS (restless legs syndrome)   ? Sleep apnea   ? doesnt use CPAP  ? ? ?Past Surgical History:  ?Procedure Laterality Date  ? ABDOMINAL HYSTERECTOMY    ? BACK SURGERY    ? BREAST EXCISIONAL BIOPSY Left 1980's  ? neg  ? CHOLECYSTECTOMY    ? COLON SURGERY    ? partial colectomy d/t cancer cells  ? COLONOSCOPY    ? HIP CLOSED REDUCTION Left 12/11/2020  ? Procedure: CLOSED REDUCTION HIP;  Surgeon: Hessie Knows, MD;  Location: [facility] ORS;  Service: Orthopedics;  Laterality: Left;  ? I&D of left hip     ? x 4  ? INTERSTIM IMPLANT PLACEMENT N/A 03/08/2019  ? Procedure: INTERSTIM IMPLANT FIRST AND SECOND STAGE WITH IMPEDENCE CHECK;  Surgeon: Bjorn Loser, MD;  Location: [facility] ORS;  Service: Urology;  Laterality: N/A;  ? JOINT  REPLACEMENT    ? Resection total hip    ? Revision of total hip arthroplasty    ? x3-4  ? right hip replacement    ? TOTAL HIP ARTHROPLASTY Left   ? ? ?Prior to Admission medications   ?Medication Sig Start Date End Date Taking? Authorizing Provider  ?ARIPiprazole (ABILIFY) 5 MG tablet Take 5 mg by mouth at bedtime.   Yes [provider]  ?aspirin 81 MG chewable tablet Chew by mouth daily.   Yes [provider]  ?ipratropium-albuterol (DUONEB) 0.5-2.5 (3) MG/3ML SOLN Take 3 mLs by nebulization every 6 (six) hours as needed (for wheezing/shortness of breath).   Yes [provider]  ?montelukast (SINGULAIR) 10 MG tablet Take 10 mg by mouth daily. 11/16/20  Yes [provider]  ?Multiple Vitamin (MULTIVITAMIN WITH MINERALS) TABS tablet Take 2 tablets by mouth daily.   Yes [provider]  ?PARoxetine (PAXIL) 40 MG tablet Take 40 mg by mouth daily at 2 PM.    Yes [provider]  ?rOPINIRole (REQUIP) 3 MG tablet Take  1 tablet (3 mg total) by mouth 4 (four) times daily. 04/20/15  Yes Crissman, Jeannette How, MD  ?spironolactone (ALDACTONE) 25 MG tablet Take 25 mg by mouth daily at 2 PM.    Yes [provider]  ?Donnal Debar 200-62.5-25 MCG/INH AEPB Inhale 1 puff into the lungs daily. 11/17/20  Yes [provider]  ?acetaminophen (TYLENOL) 500 MG tablet Take 1,000 mg by mouth every 6 (six) hours as needed for moderate pain or headache.    [provider]  ?albuterol (PROVENTIL HFA;VENTOLIN HFA) 108 (90 Base) MCG/ACT inhaler Inhale 2 puffs into the lungs every 4 (four) hours as needed for wheezing or shortness of breath. 12/05/15   Loletha Grayer, MD  ?buPROPion (WELLBUTRIN) 75 MG tablet Take 75 mg by mouth 2 (two) times daily. Unsure of dose ?Patient not taking: No sig reported    [provider]  ?etodolac (LODINE) 400 MG tablet Take 400 mg by mouth 2 (two) times daily. ?Patient not taking: No sig reported    [provider]   ?fluticasone (FLONASE) 50 MCG/ACT nasal spray Place 1 spray into both nostrils daily as needed (for sinus issues).  ?Patient not taking: Reported on 10/01/2021    [provider]  ?furosemide (LASIX) 20 MG tablet Take 1 tablet (20 mg total) by mouth daily. ?Patient taking differently: Take 20 mg by mouth 2 (two) times daily. 04/29/16 02/28/26  Lavonia Drafts, MD  ?gabapentin (NEURONTIN) 100 MG capsule Take 200 mg by mouth at bedtime. ?Patient not taking: No sig reported 02/15/18   [provider]  ?metoprolol succinate (TOPROL-XL) 50 MG 24 hr tablet Take 1 tablet (50 mg total) by mouth every evening. Take with or immediately following a meal. ?Patient taking differently: Take 50 mg by mouth daily at 2 PM. Take with or immediately following a meal. 12/26/15   Crissman, Jeannette How, MD  ?nystatin (MYCOSTATIN) 100000 UNIT/ML suspension Take 5 mLs by mouth daily at 12 noon. 11/20/20   [provider]  ?temazepam (RESTORIL) 15 MG capsule Take 15 mg by mouth at bedtime as needed for sleep. ?Patient not taking: Reported on 10/01/2021 10/04/20   [provider]  ? ? ?Allergies as of 09/11/2021 - Review Complete 12/11/2020  ?Allergen Reaction Noted  ? Hydrocodone-chlorpheniramine Shortness Of Breath 07/20/2015  ? Sulfa antibiotics Shortness Of Breath 02/03/2014  ? Chlorhexidine Rash 02/27/2015  ? Methadone Other (See Comments) 02/03/2014  ? Benadryl [diphenhydramine] Anxiety 02/03/2014  ? Latex Rash 12/28/2016  ? Tape Rash 12/28/2016  ? ? ?Family History  ?Problem Relation Age of Onset  ? Cancer Mother   ? Stroke Father   ? Diabetes Sister   ? Mental illness Sister   ? ? ?Social History  ? ?Socioeconomic History  ? Marital status: Married  ?  Spouse name: Not on file  ? Number of children: Not on file  ? Years of education: Not on file  ? Highest education level: Not on file  ?Occupational History  ? Not on file  ?Tobacco Use  ? Smoking status: Every Day  ?  Packs/day: 0.50  ?  Years: 51.00  ?  Pack  years: 25.50  ?  Types: Cigarettes  ?  Start date: 07/08/1964  ? Smokeless tobacco: Never  ?Vaping Use  ? Vaping Use: Never used  ?Substance and Sexual Activity  ? Alcohol use: No  ? Drug use: No  ? Sexual activity: Not on file  ?Other Topics Concern  ? Not on file  ?Social History  Narrative  ? Not on file  ? ?Social Determinants of Health  ? ?Financial Resource Strain: Not on file  ?Food Insecurity: Not on file  ?Transportation Needs: Not on file  ?Physical Activity: Not on file  ?Stress: Not on file  ?Social Connections: Not on file  ?Intimate Partner Violence: Not on file  ? ? ?Review of Systems: ?See HPI, otherwise negative ROS ? ?Physical Exam: ?BP 132/77   Pulse (!) 51   Temp (!) 97.2 ?F (36.2 ?C) (Temporal)   Resp 18   Ht 4' 9.99" (1.473 m)   Wt 77.1 kg   SpO2 94%   BMI 35.54 kg/m?  ?General:   Alert, cooperative in NAD ?Head:  Normocephalic and atraumatic. ?Respiratory:  Normal work of breathing. ?Cardiovascular:  RRR ? ?Impression/Plan: ?JENICA COSTILOW is here for cataract surgery. ? ?Risks, benefits, limitations, and alternatives regarding cataract surgery have been reviewed with the patient.  Questions have been answered.  All parties agreeable. ? ? ?Birder Robson, MD  10/09/2021, 10:46 AM ? ? ?

## 2021-10-10 ENCOUNTER — Encounter: Payer: Self-pay | Admitting: Ophthalmology

## 2021-10-10 ENCOUNTER — Other Ambulatory Visit: Payer: Self-pay

## 2021-10-19 NOTE — Discharge Instructions (Signed)

## 2021-10-23 ENCOUNTER — Ambulatory Visit: Payer: Medicare PPO | Admitting: Anesthesiology

## 2021-10-23 ENCOUNTER — Ambulatory Visit
Admission: RE | Admit: 2021-10-23 | Discharge: 2021-10-23 | Disposition: A | Discharge status: Home / Self Care | Payer: Medicare PPO | Attending: Ophthalmology | Admitting: Ophthalmology

## 2021-10-23 ENCOUNTER — Encounter: Payer: Self-pay | Admitting: Ophthalmology

## 2021-10-23 ENCOUNTER — Other Ambulatory Visit: Payer: Self-pay

## 2021-10-23 ENCOUNTER — Encounter
Admission: RE | Disposition: A | Discharge status: Home / Self Care | Payer: Self-pay | Source: Home / Self Care | Attending: Ophthalmology

## 2021-10-23 DIAGNOSIS — Z6836 Body mass index (BMI) 36.0-36.9, adult: Secondary | ICD-10-CM | POA: Diagnosis not present

## 2021-10-23 DIAGNOSIS — M1990 Unspecified osteoarthritis, unspecified site: Secondary | ICD-10-CM | POA: Diagnosis not present

## 2021-10-23 DIAGNOSIS — H2512 Age-related nuclear cataract, left eye: Secondary | ICD-10-CM | POA: Insufficient documentation

## 2021-10-23 DIAGNOSIS — G4730 Sleep apnea, unspecified: Secondary | ICD-10-CM | POA: Diagnosis not present

## 2021-10-23 DIAGNOSIS — Z79899 Other long term (current) drug therapy: Secondary | ICD-10-CM | POA: Insufficient documentation

## 2021-10-23 DIAGNOSIS — J449 Chronic obstructive pulmonary disease, unspecified: Secondary | ICD-10-CM | POA: Insufficient documentation

## 2021-10-23 DIAGNOSIS — F32A Depression, unspecified: Secondary | ICD-10-CM | POA: Diagnosis not present

## 2021-10-23 DIAGNOSIS — I10 Essential (primary) hypertension: Secondary | ICD-10-CM | POA: Insufficient documentation

## 2021-10-23 DIAGNOSIS — Z86711 Personal history of pulmonary embolism: Secondary | ICD-10-CM | POA: Insufficient documentation

## 2021-10-23 DIAGNOSIS — F17210 Nicotine dependence, cigarettes, uncomplicated: Secondary | ICD-10-CM | POA: Insufficient documentation

## 2021-10-23 DIAGNOSIS — F419 Anxiety disorder, unspecified: Secondary | ICD-10-CM | POA: Diagnosis not present

## 2021-10-23 DIAGNOSIS — I2510 Atherosclerotic heart disease of native coronary artery without angina pectoris: Secondary | ICD-10-CM | POA: Insufficient documentation

## 2021-10-23 HISTORY — PX: CATARACT EXTRACTION W/PHACO: SHX586

## 2021-10-23 SURGERY — PHACOEMULSIFICATION, CATARACT, WITH IOL INSERTION
Anesthesia: Monitor Anesthesia Care | Site: Eye | Laterality: Left

## 2021-10-23 MED ORDER — ARMC OPHTHALMIC DILATING DROPS
1.0000 "application " | OPHTHALMIC | Status: DC | PRN
  Administered 2021-10-23 (×3): 1 via OPHTHALMIC

## 2021-10-23 MED ORDER — SIGHTPATH DOSE#1 NA CHONDROIT SULF-NA HYALURON 40-17 MG/ML IO SOLN
INTRAOCULAR | Status: DC | PRN
  Administered 2021-10-23: 1 mL via INTRAOCULAR

## 2021-10-23 MED ORDER — TETRACAINE HCL 0.5 % OP SOLN
1.0000 [drp] | OPHTHALMIC | Status: DC | PRN
  Administered 2021-10-23 (×3): 1 [drp] via OPHTHALMIC

## 2021-10-23 MED ORDER — SIGHTPATH DOSE#1 BSS IO SOLN
INTRAOCULAR | Status: DC | PRN
  Administered 2021-10-23: 63 mL via OPHTHALMIC

## 2021-10-23 MED ORDER — MOXIFLOXACIN HCL 0.5 % OP SOLN
OPHTHALMIC | Status: DC | PRN
Start: 2021-10-23 — End: 2021-10-23
  Administered 2021-10-23: 0.2 mL via OPHTHALMIC

## 2021-10-23 MED ORDER — FENTANYL CITRATE (PF) 100 MCG/2ML IJ SOLN
INTRAMUSCULAR | Status: DC | PRN
  Administered 2021-10-23: 50 ug via INTRAVENOUS

## 2021-10-23 MED ORDER — LACTATED RINGERS IV SOLN: INTRAVENOUS | Status: DC

## 2021-10-23 MED ORDER — OXYCODONE HCL 5 MG/5ML PO SOLN: 5.0000 mg | Freq: Once | ORAL | Status: DC | PRN

## 2021-10-23 MED ORDER — OXYCODONE HCL 5 MG PO TABS: 5.0000 mg | ORAL_TABLET | Freq: Once | ORAL | Status: DC | PRN

## 2021-10-23 MED ORDER — MIDAZOLAM HCL 2 MG/2ML IJ SOLN
INTRAMUSCULAR | Status: DC | PRN
  Administered 2021-10-23: 1 mg via INTRAVENOUS

## 2021-10-23 MED ORDER — LIDOCAINE HCL (PF) 2 % IJ SOLN
INTRAMUSCULAR | Status: DC | PRN
  Administered 2021-10-23: 1 mL via INTRAMUSCULAR

## 2021-10-23 MED ORDER — SIGHTPATH DOSE#1 BSS IO SOLN
INTRAOCULAR | Status: DC | PRN
Start: 2021-10-23 — End: 2021-10-23
  Administered 2021-10-23: 15 mL

## 2021-10-23 MED ORDER — BRIMONIDINE TARTRATE-TIMOLOL 0.2-0.5 % OP SOLN
OPHTHALMIC | Status: DC | PRN
Start: 2021-10-23 — End: 2021-10-23
  Administered 2021-10-23: 1 [drp] via OPHTHALMIC

## 2021-10-23 SURGICAL SUPPLY — 10 items
CATARACT SUITE SIGHTPATH (MISCELLANEOUS) ×2 IMPLANT
FEE CATARACT SUITE SIGHTPATH (MISCELLANEOUS) ×1 IMPLANT
GLOVE SURG ENC TEXT LTX SZ8 (GLOVE) ×2 IMPLANT
GLOVE SURG TRIUMPH 8.0 PF LTX (GLOVE) ×2 IMPLANT
LENS IOL TECNIS EYHANCE 23.0 (Intraocular Lens) ×1 IMPLANT
NDL FILTER BLUNT 18X1 1/2 (NEEDLE) ×1 IMPLANT
NEEDLE FILTER BLUNT 18X 1/2SAF (NEEDLE) ×1
NEEDLE FILTER BLUNT 18X1 1/2 (NEEDLE) ×1 IMPLANT
SYR 3ML LL SCALE MARK (SYRINGE) ×2 IMPLANT
WATER STERILE IRR 250ML POUR (IV SOLUTION) ×2 IMPLANT

## 2021-10-23 NOTE — Anesthesia Preprocedure Evaluation (Signed)
Anesthesia Evaluation  ?Patient identified by MRN, date of birth, ID band ?Patient awake ? ? ? ?Reviewed: ?Allergy & Precautions, NPO status  ? ?History of Anesthesia Complications ?(+) PROLONGED EMERGENCE and history of anesthetic complications ? ?Airway ?Mallampati: II ? ?TM Distance: >3 FB ? ? ? ? Dental ? ?(+) Upper Dentures, Lower Dentures ?  ?Pulmonary ?sleep apnea (no cpap) , COPD,  COPD inhaler, Current Smoker and Patient abstained from smoking., PE (2018) ?  ?Pulmonary exam normal ? ? ? ? ? ? ? Cardiovascular ?Exercise Tolerance: Good ?hypertension, + CAD (mild by cath in 2011)  ?Normal cardiovascular exam ?Rhythm:Regular Rate:Normal ? ?TTE 2021 ?NORMAL LEFT VENTRICULAR SYSTOLIC FUNCTION  ?NORMAL RIGHT VENTRICULAR SYSTOLIC FUNCTION  ?TRIVIAL REGURGITATION NOTED  ?NO VALVULAR STENOSIS  ? ?  ?Neuro/Psych ?PSYCHIATRIC DISORDERS Anxiety Depression Restless leg syndrome ?Insomnia ?  ? GI/Hepatic ?negative GI ROS, Neg liver ROS,   ?Endo/Other  ?Morbid obesity (bmi 36) ? Renal/GU ?negative Renal ROS  ? ?  ?Musculoskeletal ? ?(+) Arthritis ,  ? Abdominal ?  ?Peds ? Hematology ?Colon ca : mid 66's   ?Anesthesia Other Findings ?had cataracct on 10/09/2021; ? ? Reproductive/Obstetrics ? ?  ? ? ? ? ? ? ? ? ? ? ? ? ? ?  ?  ? ? ? ? ? ? ? ? ?Anesthesia Physical ? ?Anesthesia Plan ? ?ASA: 3 ? ?Anesthesia Plan: MAC  ? ?Post-op Pain Management: Minimal or no pain anticipated  ? ?Induction: Intravenous ? ?PONV Risk Score and Plan: 2 and TIVA and Midazolam ? ?Airway Management Planned: Natural Airway and Nasal Cannula ? ?Additional Equipment:  ? ?Intra-op Plan:  ? ?Post-operative Plan:  ? ?Informed Consent: I have reviewed the patients History and Physical, chart, labs and discussed the procedure including the risks, benefits and alternatives for the proposed anesthesia with the patient or authorized representative who has indicated his/her understanding and acceptance.  ? ? ? ? ? ?Plan  Discussed with: CRNA ? ?Anesthesia Plan Comments:   ? ? ? ? ? ? ?Anesthesia Quick Evaluation ? ?

## 2021-10-23 NOTE — Transfer of Care (Signed)
Immediate Anesthesia Transfer of Care Note ? ?Patient: Ann Silva ? ?Procedure(s) Performed: CATARACT EXTRACTION PHACO AND INTRAOCULAR LENS PLACEMENT (IOC) LEFT 7.34 00:47.4 (Left: Eye) ? ?Patient Location: PACU ? ?Anesthesia Type: MAC ? ?Level of Consciousness: awake, alert  and patient cooperative ? ?Airway and Oxygen Therapy: Patient Spontanous Breathing and Patient connected to supplemental oxygen ? ?Post-op Assessment: Post-op Vital signs reviewed, Patient's Cardiovascular Status Stable, Respiratory Function Stable, Patent Airway and No signs of Nausea or vomiting ? ?Post-op Vital Signs: Reviewed and stable ? ?Complications: No notable events documented. ? ?

## 2021-10-23 NOTE — Anesthesia Postprocedure Evaluation (Signed)
Anesthesia Post Note ? ?Patient: Ann Silva ? ?Procedure(s) Performed: CATARACT EXTRACTION PHACO AND INTRAOCULAR LENS PLACEMENT (IOC) LEFT 7.34 00:47.4 (Left: Eye) ? ? ?  ?Patient location during evaluation: PACU ?Anesthesia Type: MAC ?Level of consciousness: awake and alert ?Pain management: pain level controlled ?Vital Signs Assessment: post-procedure vital signs reviewed and stable ?Respiratory status: spontaneous breathing, nonlabored ventilation, respiratory function stable and patient connected to nasal cannula oxygen ?Cardiovascular status: stable and blood pressure returned to baseline ?Postop Assessment: no apparent nausea or vomiting ?Anesthetic complications: no ? ? ?No notable events documented. ? ?Fidel Levy ? ? ? ? ? ?

## 2021-10-23 NOTE — Op Note (Signed)
PREOPERATIVE DIAGNOSIS:  Nuclear sclerotic cataract of the left eye. ?  ?POSTOPERATIVE DIAGNOSIS:  Nuclear sclerotic cataract of the left eye. ?  ?OPERATIVE PROCEDURE:ORPROCALL@ ?  ?SURGEON:  Birder Robson, MD. ?  ?ANESTHESIA: ? ?Anesthesiologist: Fidel Levy, MD ?CRNA: Silvana Newness, CRNA ? ?1.      Managed anesthesia care. ?2.     0.47m of Shugarcaine was instilled following the paracentesis ?  ?COMPLICATIONS:  None. ?  ?TECHNIQUE:   Stop and chop ?  ?DESCRIPTION OF PROCEDURE:  The patient was examined and consented in the preoperative holding area where the aforementioned topical anesthesia was applied to the left eye and then brought back to the Operating Room where the left eye was prepped and draped in the usual sterile ophthalmic fashion and a lid speculum was placed. A paracentesis was created with the side port blade and the anterior chamber was filled with viscoelastic. A near clear corneal incision was performed with the steel keratome. A continuous curvilinear capsulorrhexis was performed with a cystotome followed by the capsulorrhexis forceps. Hydrodissection and hydrodelineation were carried out with BSS on a blunt cannula. The lens was removed in a stop and chop  technique and the remaining cortical material was removed with the irrigation-aspiration handpiece. The capsular bag was inflated with viscoelastic and the Technis [id number] lens was placed in the capsular bag without complication. The remaining viscoelastic was removed from the eye with the irrigation-aspiration handpiece. The wounds were hydrated. The anterior chamber was flushed with BSS and the eye was inflated to physiologic pressure. 0.148mVigamox was placed in the anterior chamber. The wounds were found to be water tight. The eye was dressed with Combigan. The patient was given protective glasses to wear throughout the day and a shield with which to sleep tonight. The patient was also given drops with which to begin a drop regimen  today and will follow-up with me in one day. ?Implant Name Type Inv. Item Serial No. Manufacturer Lot No. LRB No. Used Action  ?LENS IOL TECNIS EYHANCE 23.0 - [id number] Lens LENS IOL TECNIS EYHANCE 23.0 [id number]  Left 1 Implanted  ?  ?Procedure(s): ?CATARACT EXTRACTION PHACO AND INTRAOCULAR LENS PLACEMENT (IOC) LEFT 7.34 00:47.4 (Left) ? ?Electronically signed: WiBirder Robson/18/2023 8:45 AM ? ?

## 2021-10-23 NOTE — H&P (Signed)
[place]  ? ?Primary Care Physician:  Rusty Aus, MD ?Ophthalmologist: Dr. George Ina ? ?Pre-Procedure History & Physical: ?HPI:  Ann Silva is a 76 y.o. female here for cataract surgery. ?  ?Past Medical History:  ?Diagnosis Date  ? Anemia   ? takes ferrous sulfate daily  ? Anxiety   ? Arthritis   ? Bladder spasm   ? Chronic back pain   ? spinal stenosis  ? Colon cancer ([place])   ? Complication of anesthesia   ? B/P bottoms out, hard to wake up   ? Contact dermatitis   ? COPD (chronic obstructive pulmonary disease) ([place])   ? Albuterol inhaler and DuoNeb as needed  ? Coronary artery disease   ? mild by 08/25/09 cath  ? Foot drop, left   ? Headache   ? Heart murmur   ? never had any problems  ? History of blood transfusion   ? no abnormal reaction noted  ? History of bronchitis   ? History of MRSA infection 6+ yrs ago  ? Hypertension   ? takes Metoprolol daily  ? Insomnia   ? takes Trazodone nightly  ? Joint pain   ? Joint swelling   ? Lower extremity edema   ? Nocturia   ? Panic attacks   ? takes Effexor daily  ? Peripheral edema   ? takes Lasix daily and takes Bumex every 3 days.Takes Aldactone daily   ? Pulmonary emboli ([place]) 2018  ? Restless leg syndrome   ? takes Requip daily  ? RLS (restless legs syndrome)   ? Sleep apnea   ? doesnt use CPAP  ? ? ?Past Surgical History:  ?Procedure Laterality Date  ? ABDOMINAL HYSTERECTOMY    ? BACK SURGERY    ? BREAST EXCISIONAL BIOPSY Left 1980's  ? neg  ? CATARACT EXTRACTION W/PHACO Right 10/09/2021  ? Procedure: CATARACT EXTRACTION PHACO AND INTRAOCULAR LENS PLACEMENT (IOC) RIGHT 23.94 01:52.0;  Surgeon: Birder Robson, MD;  Location: [place];  Service: Ophthalmology;  Laterality: Right;  ? CHOLECYSTECTOMY    ? COLON SURGERY    ? partial colectomy d/t cancer cells  ? COLONOSCOPY    ? HIP CLOSED REDUCTION Left 12/11/2020  ? Procedure: CLOSED REDUCTION HIP;  Surgeon: Hessie Knows, MD;  Location: [facility];  Service: Orthopedics;  Laterality: Left;  ?  I&D of left hip     ? x 4  ? INTERSTIM IMPLANT PLACEMENT N/A 03/08/2019  ? Procedure: INTERSTIM IMPLANT FIRST AND SECOND STAGE WITH IMPEDENCE CHECK;  Surgeon: Bjorn Loser, MD;  Location: [facility];  Service: Urology;  Laterality: N/A;  ? JOINT REPLACEMENT    ? Resection total hip    ? Revision of total hip arthroplasty    ? x3-4  ? right hip replacement    ? TOTAL HIP ARTHROPLASTY Left   ? ? ?Prior to Admission medications   ?Medication Sig Start Date End Date Taking? Authorizing Provider  ?acetaminophen (TYLENOL) 500 MG tablet Take 1,000 mg by mouth every 6 (six) hours as needed for moderate pain or headache.   Yes [provider]  ?albuterol (PROVENTIL HFA;VENTOLIN HFA) 108 (90 Base) MCG/ACT inhaler Inhale 2 puffs into the lungs every 4 (four) hours as needed for wheezing or shortness of breath. 12/05/15  Yes Wieting, Richard, MD  ?ARIPiprazole (ABILIFY) 5 MG tablet Take 5 mg by mouth at bedtime.   Yes [provider]  ?aspirin 81 MG chewable tablet Chew by mouth daily.  Yes [provider]  ?ipratropium-albuterol (DUONEB) 0.5-2.5 (3) MG/3ML SOLN Take 3 mLs by nebulization every 6 (six) hours as needed (for wheezing/shortness of breath).   Yes [provider]  ?metoprolol succinate (TOPROL-XL) 50 MG 24 hr tablet Take 1 tablet (50 mg total) by mouth every evening. Take with or immediately following a meal. ?Patient taking differently: Take 50 mg by mouth daily at 2 PM. Take with or immediately following a meal. 12/26/15  Yes Crissman, Jeannette How, MD  ?montelukast (SINGULAIR) 10 MG tablet Take 10 mg by mouth daily. 11/16/20  Yes [provider]  ?Multiple Vitamin (MULTIVITAMIN WITH MINERALS) TABS tablet Take 2 tablets by mouth daily.   Yes [provider]  ?nystatin (MYCOSTATIN) 100000 UNIT/ML suspension Take 5 mLs by mouth daily at 12 noon. 11/20/20  Yes [provider]  ?PARoxetine (PAXIL) 40 MG tablet Take 40 mg by mouth daily at 2 PM.    Yes [provider]  ?rOPINIRole (REQUIP) 3 MG tablet Take 1 tablet (3 mg total) by mouth 4 (four) times daily. 04/20/15  Yes Crissman, Jeannette How, MD  ?spironolactone (ALDACTONE) 25 MG tablet Take 25 mg by mouth daily at 2 PM.    Yes [provider]  ?Donnal Debar 200-62.5-25 MCG/INH AEPB Inhale 1 puff into the lungs daily. 11/17/20  Yes [provider]  ?buPROPion (WELLBUTRIN) 75 MG tablet Take 75 mg by mouth 2 (two) times daily. Unsure of dose ?Patient not taking: No sig reported    [provider]  ?etodolac (LODINE) 400 MG tablet Take 400 mg by mouth 2 (two) times daily. ?Patient not taking: No sig reported    [provider]  ?fluticasone (FLONASE) 50 MCG/ACT nasal spray Place 1 spray into both nostrils daily as needed (for sinus issues).  ?Patient not taking: Reported on 10/01/2021    [provider]  ?furosemide (LASIX) 20 MG tablet Take 1 tablet (20 mg total) by mouth daily. ?Patient taking differently: Take 20 mg by mouth 2 (two) times daily. 04/29/16 02/28/26  Lavonia Drafts, MD  ?gabapentin (NEURONTIN) 100 MG capsule Take 200 mg by mouth at bedtime. ?Patient not taking: No sig reported 02/15/18   [provider]  ?temazepam (RESTORIL) 15 MG capsule Take 15 mg by mouth at bedtime as needed for sleep. ?Patient not taking: Reported on 10/01/2021 10/04/20   [provider]  ? ? ?Allergies as of 09/11/2021 - Review Complete 12/11/2020  ?Allergen Reaction Noted  ? Hydrocodone-chlorpheniramine Shortness Of Breath 07/20/2015  ? Sulfa antibiotics Shortness Of Breath 02/03/2014  ? Chlorhexidine Rash 02/27/2015  ? Methadone Other (See Comments) 02/03/2014  ? Benadryl [diphenhydramine] Anxiety 02/03/2014  ? Latex Rash 12/28/2016  ? Tape Rash 12/28/2016  ? ? ?Family History  ?Problem Relation Age of Onset  ? Cancer Mother   ? Stroke Father   ? Diabetes Sister   ? Mental illness Sister   ? ? ?Social History  ? ?Socioeconomic History  ? Marital status: Married  ?   Spouse name: Not on file  ? Number of children: Not on file  ? Years of education: Not on file  ? Highest education level: Not on file  ?Occupational History  ? Not on file  ?Tobacco Use  ? Smoking status: Every Day  ?  Packs/day: 0.50  ?  Years: 51.00  ?  Pack years: 25.50  ?  Types: Cigarettes  ?  Start date: 07/08/1964  ? Smokeless tobacco: Never  ?Vaping Use  ? Vaping  Use: Never used  ?Substance and Sexual Activity  ? Alcohol use: No  ? Drug use: No  ? Sexual activity: Not on file  ?Other Topics Concern  ? Not on file  ?Social History Narrative  ? Not on file  ? ?Social Determinants of Health  ? ?Financial Resource Strain: Not on file  ?Food Insecurity: Not on file  ?Transportation Needs: Not on file  ?Physical Activity: Not on file  ?Stress: Not on file  ?Social Connections: Not on file  ?Intimate Partner Violence: Not on file  ? ? ?Review of Systems: ?See HPI, otherwise negative ROS ? ?Physical Exam: ?BP 126/72   Pulse (!) 52   Temp (!) 97.3 ?F (36.3 ?C) (Temporal)   Resp 16   Ht 4' 9.99" (1.473 m)   Wt 78.5 kg   SpO2 92%   BMI 36.17 kg/m?  ?General:   Alert, cooperative in NAD ?Head:  Normocephalic and atraumatic. ?Respiratory:  Normal work of breathing. ?Cardiovascular:  RRR ? ?Impression/Plan: ?Ann Silva is here for cataract surgery. ? ?Risks, benefits, limitations, and alternatives regarding cataract surgery have been reviewed with the patient.  Questions have been answered.  All parties agreeable. ? ? ?Birder Robson, MD  10/23/2021, 8:20 AM ? ? ?

## 2021-10-24 ENCOUNTER — Encounter: Payer: Self-pay | Admitting: Ophthalmology

## 2022-05-19 ENCOUNTER — Emergency Department: Payer: Medicare PPO

## 2022-05-19 ENCOUNTER — Inpatient Hospital Stay
Admission: EM | Admit: 2022-05-19 | Discharge: 2022-05-23 | DRG: 163 | Disposition: A | Discharge status: Home Health | Payer: Medicare PPO | Attending: Internal Medicine | Admitting: Internal Medicine

## 2022-05-19 ENCOUNTER — Inpatient Hospital Stay: Payer: Medicare PPO

## 2022-05-19 DIAGNOSIS — Z85038 Personal history of other malignant neoplasm of large intestine: Secondary | ICD-10-CM | POA: Diagnosis not present

## 2022-05-19 DIAGNOSIS — J441 Chronic obstructive pulmonary disease with (acute) exacerbation: Secondary | ICD-10-CM | POA: Diagnosis present

## 2022-05-19 DIAGNOSIS — J09X2 Influenza due to identified novel influenza A virus with other respiratory manifestations: Secondary | ICD-10-CM | POA: Diagnosis present

## 2022-05-19 DIAGNOSIS — I2694 Multiple subsegmental pulmonary emboli without acute cor pulmonale: Secondary | ICD-10-CM | POA: Diagnosis present

## 2022-05-19 DIAGNOSIS — R001 Bradycardia, unspecified: Secondary | ICD-10-CM | POA: Diagnosis not present

## 2022-05-19 DIAGNOSIS — E669 Obesity, unspecified: Secondary | ICD-10-CM | POA: Diagnosis present

## 2022-05-19 DIAGNOSIS — I10 Essential (primary) hypertension: Secondary | ICD-10-CM | POA: Diagnosis present

## 2022-05-19 DIAGNOSIS — E872 Acidosis, unspecified: Secondary | ICD-10-CM | POA: Diagnosis present

## 2022-05-19 DIAGNOSIS — Z7951 Long term (current) use of inhaled steroids: Secondary | ICD-10-CM

## 2022-05-19 DIAGNOSIS — E213 Hyperparathyroidism, unspecified: Secondary | ICD-10-CM | POA: Diagnosis present

## 2022-05-19 DIAGNOSIS — Z86711 Personal history of pulmonary embolism: Secondary | ICD-10-CM | POA: Diagnosis not present

## 2022-05-19 DIAGNOSIS — F17200 Nicotine dependence, unspecified, uncomplicated: Secondary | ICD-10-CM | POA: Diagnosis present

## 2022-05-19 DIAGNOSIS — F411 Generalized anxiety disorder: Secondary | ICD-10-CM | POA: Diagnosis present

## 2022-05-19 DIAGNOSIS — Z79899 Other long term (current) drug therapy: Secondary | ICD-10-CM

## 2022-05-19 DIAGNOSIS — I2699 Other pulmonary embolism without acute cor pulmonale: Secondary | ICD-10-CM | POA: Diagnosis not present

## 2022-05-19 DIAGNOSIS — J9601 Acute respiratory failure with hypoxia: Secondary | ICD-10-CM | POA: Diagnosis present

## 2022-05-19 DIAGNOSIS — J449 Chronic obstructive pulmonary disease, unspecified: Secondary | ICD-10-CM | POA: Diagnosis present

## 2022-05-19 DIAGNOSIS — Z6836 Body mass index (BMI) 36.0-36.9, adult: Secondary | ICD-10-CM | POA: Diagnosis not present

## 2022-05-19 DIAGNOSIS — G8929 Other chronic pain: Secondary | ICD-10-CM | POA: Diagnosis present

## 2022-05-19 DIAGNOSIS — I2510 Atherosclerotic heart disease of native coronary artery without angina pectoris: Secondary | ICD-10-CM | POA: Diagnosis present

## 2022-05-19 DIAGNOSIS — N179 Acute kidney failure, unspecified: Secondary | ICD-10-CM | POA: Diagnosis present

## 2022-05-19 DIAGNOSIS — R4182 Altered mental status, unspecified: Secondary | ICD-10-CM

## 2022-05-19 DIAGNOSIS — B349 Viral infection, unspecified: Secondary | ICD-10-CM | POA: Diagnosis not present

## 2022-05-19 DIAGNOSIS — Z9049 Acquired absence of other specified parts of digestive tract: Secondary | ICD-10-CM

## 2022-05-19 DIAGNOSIS — Z1152 Encounter for screening for COVID-19: Secondary | ICD-10-CM

## 2022-05-19 DIAGNOSIS — Z882 Allergy status to sulfonamides status: Secondary | ICD-10-CM | POA: Diagnosis not present

## 2022-05-19 DIAGNOSIS — I2609 Other pulmonary embolism with acute cor pulmonale: Secondary | ICD-10-CM | POA: Diagnosis not present

## 2022-05-19 DIAGNOSIS — Z720 Tobacco use: Secondary | ICD-10-CM | POA: Insufficient documentation

## 2022-05-19 DIAGNOSIS — N3281 Overactive bladder: Secondary | ICD-10-CM | POA: Diagnosis present

## 2022-05-19 DIAGNOSIS — J101 Influenza due to other identified influenza virus with other respiratory manifestations: Secondary | ICD-10-CM | POA: Diagnosis present

## 2022-05-19 DIAGNOSIS — Z7901 Long term (current) use of anticoagulants: Secondary | ICD-10-CM | POA: Diagnosis not present

## 2022-05-19 LAB — COMPREHENSIVE METABOLIC PANEL
ALT: 25 U/L (ref 0–44)
AST: 41 U/L (ref 15–41)
Albumin: 3.5 g/dL (ref 3.5–5.0)
Alkaline Phosphatase: 99 U/L (ref 38–126)
Anion gap: 6 (ref 5–15)
BUN: 26 mg/dL — ABNORMAL HIGH (ref 8–23)
CO2: 23 mmol/L (ref 22–32)
Calcium: 10.5 mg/dL — ABNORMAL HIGH (ref 8.9–10.3)
Chloride: 109 mmol/L (ref 98–111)
Creatinine, Ser: 1.04 mg/dL — ABNORMAL HIGH (ref 0.44–1.00)
GFR, Estimated: 55 mL/min — ABNORMAL LOW (ref >60)
Glucose, Bld: 144 mg/dL — ABNORMAL HIGH (ref 70–99)
Potassium: 4.8 mmol/L (ref 3.5–5.1)
Sodium: 138 mmol/L (ref 135–145)
Total Bilirubin: 1.1 mg/dL (ref 0.3–1.2)
Total Protein: 7.5 g/dL (ref 6.5–8.1)

## 2022-05-19 LAB — RESP PANEL BY RT-PCR (FLU A&B, COVID) ARPGX2
Influenza A by PCR: POSITIVE — AB
Influenza B by PCR: NEGATIVE
SARS Coronavirus 2 by RT PCR: NEGATIVE

## 2022-05-19 LAB — CBC WITH DIFFERENTIAL/PLATELET
Abs Immature Granulocytes: 0.05 10*3/uL (ref 0.00–0.07)
Basophils Absolute: 0 10*3/uL (ref 0.0–0.1)
Basophils Relative: 0 %
Eosinophils Absolute: 0 10*3/uL (ref 0.0–0.5)
Eosinophils Relative: 0 %
HCT: 45.3 % (ref 36.0–46.0)
Hemoglobin: 15.2 g/dL — ABNORMAL HIGH (ref 12.0–15.0)
Immature Granulocytes: 1 %
Lymphocytes Relative: 6 %
Lymphs Abs: 0.5 10*3/uL — ABNORMAL LOW (ref 0.7–4.0)
MCH: 30.9 pg (ref 26.0–34.0)
MCHC: 33.6 g/dL (ref 30.0–36.0)
MCV: 92.1 fL (ref 80.0–100.0)
Monocytes Absolute: 0.4 10*3/uL (ref 0.1–1.0)
Monocytes Relative: 5 %
Neutro Abs: 7.2 10*3/uL (ref 1.7–7.7)
Neutrophils Relative %: 88 %
Platelets: 201 10*3/uL (ref 150–400)
RBC: 4.92 MIL/uL (ref 3.87–5.11)
RDW: 15.3 % (ref 11.5–15.5)
WBC: 8.3 10*3/uL (ref 4.0–10.5)
nRBC: 0 % (ref 0.0–0.2)

## 2022-05-19 LAB — BRAIN NATRIURETIC PEPTIDE: B Natriuretic Peptide: 377.8 pg/mL — ABNORMAL HIGH (ref 0.0–100.0)

## 2022-05-19 LAB — URINALYSIS, ROUTINE W REFLEX MICROSCOPIC
Bilirubin Urine: NEGATIVE
Glucose, UA: NEGATIVE mg/dL
Hgb urine dipstick: NEGATIVE
Ketones, ur: NEGATIVE mg/dL
Leukocytes,Ua: NEGATIVE
Nitrite: NEGATIVE
Protein, ur: NEGATIVE mg/dL
Specific Gravity, Urine: 1.014 (ref 1.005–1.030)
pH: 6 (ref 5.0–8.0)

## 2022-05-19 LAB — BLOOD GAS, VENOUS
Acid-Base Excess: 3.1 mmol/L — ABNORMAL HIGH (ref 0.0–2.0)
Bicarbonate: 27 mmol/L (ref 20.0–28.0)
O2 Saturation: 84.5 %
Patient temperature: 37
pCO2, Ven: 38 mmHg — ABNORMAL LOW (ref 44–60)
pH, Ven: 7.46 — ABNORMAL HIGH (ref 7.25–7.43)
pO2, Ven: 50 mmHg — ABNORMAL HIGH (ref 32–45)

## 2022-05-19 LAB — TROPONIN I (HIGH SENSITIVITY)
Troponin I (High Sensitivity): 4 ng/L (ref <18)
Troponin I (High Sensitivity): 5 ng/L (ref <18)

## 2022-05-19 LAB — LACTIC ACID, PLASMA
Lactic Acid, Venous: 2.2 mmol/L (ref 0.5–1.9)
Lactic Acid, Venous: 1.5 mmol/L (ref 0.5–1.9)

## 2022-05-19 LAB — PROTIME-INR
INR: 1.1 (ref 0.8–1.2)
Prothrombin Time: 13.8 seconds (ref 11.4–15.2)

## 2022-05-19 LAB — PROCALCITONIN: Procalcitonin: <0.1 ng/mL

## 2022-05-19 LAB — APTT: aPTT: 30 seconds (ref 24–36)

## 2022-05-19 MED ORDER — ACETAMINOPHEN 650 MG RE SUPP: 650.0000 mg | Freq: Four times a day (QID) | RECTAL | Status: DC | PRN

## 2022-05-19 MED ORDER — OSELTAMIVIR PHOSPHATE 30 MG PO CAPS
30.0000 mg | ORAL_CAPSULE | Freq: Two times a day (BID) | ORAL | Status: DC
  Administered 2022-05-20 – 2022-05-23 (×7): 30 mg via ORAL
  Filled 2022-05-19 (×7): qty 1

## 2022-05-19 MED ORDER — OXYBUTYNIN CHLORIDE 5 MG PO TABS
5.0000 mg | ORAL_TABLET | Freq: Two times a day (BID) | ORAL | Status: DC
  Administered 2022-05-20 – 2022-05-23 (×7): 5 mg via ORAL
  Filled 2022-05-19 (×8): qty 1

## 2022-05-19 MED ORDER — MONTELUKAST SODIUM 10 MG PO TABS
10.0000 mg | ORAL_TABLET | Freq: Every day | ORAL | Status: DC
  Administered 2022-05-20 – 2022-05-23 (×4): 10 mg via ORAL
  Filled 2022-05-19 (×4): qty 1

## 2022-05-19 MED ORDER — IPRATROPIUM-ALBUTEROL 0.5-2.5 (3) MG/3ML IN SOLN: 3.0000 mL | Freq: Four times a day (QID) | RESPIRATORY_TRACT | Status: DC | PRN

## 2022-05-19 MED ORDER — SODIUM CHLORIDE 0.9 % IV SOLN
2.0000 g | INTRAVENOUS | Status: DC
  Administered 2022-05-19: 2 g via INTRAVENOUS
  Filled 2022-05-19: qty 20

## 2022-05-19 MED ORDER — LACTATED RINGERS IV SOLN: INTRAVENOUS | Status: DC

## 2022-05-19 MED ORDER — PANTOPRAZOLE SODIUM 40 MG PO TBEC
40.0000 mg | DELAYED_RELEASE_TABLET | Freq: Every day | ORAL | Status: DC
  Administered 2022-05-19 – 2022-05-23 (×5): 40 mg via ORAL
  Filled 2022-05-19 (×5): qty 1

## 2022-05-19 MED ORDER — ACETAMINOPHEN 325 MG PO TABS: 650.0000 mg | ORAL_TABLET | Freq: Four times a day (QID) | ORAL | Status: DC | PRN

## 2022-05-19 MED ORDER — SODIUM CHLORIDE 0.9 % IV SOLN
500.0000 mg | INTRAVENOUS | Status: DC
  Administered 2022-05-19: 500 mg via INTRAVENOUS
  Filled 2022-05-19: qty 5

## 2022-05-19 MED ORDER — ONDANSETRON HCL 4 MG/2ML IJ SOLN: 4.0000 mg | Freq: Four times a day (QID) | INTRAMUSCULAR | Status: DC | PRN

## 2022-05-19 MED ORDER — FUROSEMIDE 10 MG/ML IJ SOLN
40.0000 mg | Freq: Once | INTRAMUSCULAR | Status: AC
  Administered 2022-05-19: 40 mg via INTRAVENOUS
  Filled 2022-05-19: qty 4

## 2022-05-19 MED ORDER — OSELTAMIVIR PHOSPHATE 75 MG PO CAPS
75.0000 mg | ORAL_CAPSULE | Freq: Once | ORAL | Status: AC
  Administered 2022-05-19: 75 mg via ORAL
  Filled 2022-05-19: qty 1

## 2022-05-19 MED ORDER — HEPARIN BOLUS VIA INFUSION
4000.0000 [IU] | Freq: Once | INTRAVENOUS | Status: DC
  Filled 2022-05-19: qty 4000

## 2022-05-19 MED ORDER — PAROXETINE HCL 20 MG PO TABS
40.0000 mg | ORAL_TABLET | Freq: Every day | ORAL | Status: DC
  Administered 2022-05-20 – 2022-05-23 (×4): 40 mg via ORAL
  Filled 2022-05-19 (×4): qty 2

## 2022-05-19 MED ORDER — ONDANSETRON HCL 4 MG PO TABS: 4.0000 mg | ORAL_TABLET | Freq: Four times a day (QID) | ORAL | Status: DC | PRN

## 2022-05-19 MED ORDER — MOMETASONE FURO-FORMOTEROL FUM 200-5 MCG/ACT IN AERO
2.0000 | INHALATION_SPRAY | Freq: Two times a day (BID) | RESPIRATORY_TRACT | Status: DC
  Administered 2022-05-21 – 2022-05-23 (×5): 2 via RESPIRATORY_TRACT
  Filled 2022-05-19: qty 8.8

## 2022-05-19 MED ORDER — HEPARIN SODIUM (PORCINE) 5000 UNIT/ML IJ SOLN
4000.0000 [IU] | Freq: Once | INTRAMUSCULAR | Status: AC
  Administered 2022-05-19: 4000 [IU] via INTRAVENOUS
  Filled 2022-05-19: qty 1

## 2022-05-19 MED ORDER — GUAIFENESIN 100 MG/5ML PO LIQD: 5.0000 mL | ORAL | Status: DC | PRN

## 2022-05-19 MED ORDER — IOHEXOL 350 MG/ML SOLN
75.0000 mL | Freq: Once | INTRAVENOUS | Status: AC | PRN
  Administered 2022-05-19: 75 mL via INTRAVENOUS

## 2022-05-19 MED ORDER — HEPARIN (PORCINE) 25000 UT/250ML-% IV SOLN
1000.0000 [IU]/h | INTRAVENOUS | Status: DC
  Administered 2022-05-19 – 2022-05-20 (×2): 1000 [IU]/h via INTRAVENOUS
  Filled 2022-05-19 (×2): qty 250

## 2022-05-19 MED ORDER — LACTATED RINGERS IV SOLN: INTRAVENOUS | Status: AC

## 2022-05-19 MED ORDER — OSELTAMIVIR PHOSPHATE 75 MG PO CAPS: 75.0000 mg | ORAL_CAPSULE | Freq: Two times a day (BID) | ORAL | Status: DC

## 2022-05-19 NOTE — Assessment & Plan Note (Signed)
Secondary to acute pulmonary embolism Patient was hypoxic in the field with room air pulse oximetry of 77%, she was tachypneic with increased work of breathing and is currently on 2 L of oxygen with pulse oximetry greater than 92% We will attempt to wean patient off oxygen once acute illness improves or resolves.   She will need to be assessed for home oxygen need prior to discharge

## 2022-05-19 NOTE — Assessment & Plan Note (Signed)
Probably secondary to poor oral intake Patient has dry mucous membranes and poor skin turgor We will hydrate patient Hold spironolactone Repeat renal parameters in a.m.

## 2022-05-19 NOTE — Consult Note (Signed)
ANTICOAGULATION CONSULT NOTE - Initial Consult  Pharmacy Consult for heparin infusion Indication: pulmonary embolus  Not on File  Patient Measurements: Height: 4\' 10"  (147.3 cm) Weight: 80.7 kg (178 lb) IBW/kg (Calculated) : 40.9 Heparin Dosing Weight: 60 kg   Vital Signs: Temp: 100.8 F (38.2 C) (11/12 1346) Temp Source: Oral (11/12 1346) BP: 127/68 (11/12 1400) Pulse Rate: 79 (11/12 1400)  Labs: Recent Labs    05/19/22 1348  HGB 15.2*  HCT 45.3  PLT 201  CREATININE 1.04*  TROPONINIHS 4    Estimated Creatinine Clearance: 40.6 mL/min (A) (by C-G formula based on SCr of 1.04 mg/dL (H)).   Medical History: Past Medical History:  Diagnosis Date   COPD (chronic obstructive pulmonary disease) (HCC)    Hypertension     Medications:  No prior anticoagulation noted   Assessment: 76 y.o. female with past medical history of hypertension, COPD, CAD, PE no longer on anticoagulation, and colon cancer who presents to the ED complaining of shortness of breath. CT CT showing moderate PE with evidence of right heart strain. Pharmacy consulted for initiation and management of heparin infusion.  Goal of Therapy:  Heparin level 0.3-0.7 units/ml Monitor platelets by anticoagulation protocol: Yes   Plan:  Give 4000 units bolus x 1 Start heparin infusion at 1000 units/hr Check anti-Xa level in 8 hours and daily while on heparin Continue to monitor H&H and platelets  61, PharmD, BCPS Clinical Pharmacist   05/19/2022,3:34 PM

## 2022-05-19 NOTE — Consult Note (Signed)
CODE SEPSIS - PHARMACY COMMUNICATION  **Broad Spectrum Antibiotics should be administered within 1 hour of Sepsis diagnosis**  Time Code Sepsis Called/Page Received: 1432  Antibiotics Ordered: Ceftriaxone & Azithromycin   Time of 1st antibiotic administration: 1521  Additional action taken by pharmacy: N/A  Celene Squibb, PharmD PGY1 Pharmacy Resident 05/19/2022 2:44 PM

## 2022-05-19 NOTE — Assessment & Plan Note (Signed)
Patient presents to the ER for evaluation of a fever, cough productive of clear phlegm and shortness of breath and her influenza A PCR is positive We will start patient on Tamiflu 75 mg twice daily Supportive care with expectorants, antipyretics and nebulizer treatments

## 2022-05-19 NOTE — Assessment & Plan Note (Signed)
History of COPD not acutely exacerbated Continue as needed bronchodilator therapy and inhaled steroids

## 2022-05-19 NOTE — ED Notes (Signed)
Critical result Lactic 2.2, [place], MD, made aware.

## 2022-05-19 NOTE — ED Notes (Signed)
Report received from [place], [place]. Pt transferred from Main ED. Presents Lethargic.Marland Kitchen on 2L [place]. VSS. 10 mL Hep Drip running. [place] at bedside.  Pt is on monitor.

## 2022-05-19 NOTE — ED Provider Notes (Addendum)
[facility] Provider Note    Event Date/Time   First MD Initiated Contact with Patient 05/19/22 1345     (approximate)   History   Chief Complaint Shortness of Breath   HPI  Ann Silva is a 76 y.o. female with past medical history of hypertension, COPD, CAD, PE, and colon cancer who presents to the ED complaining of shortness of breath.  History is limited as patient is quite somnolent and disoriented on arrival.  Per husband at bedside, she has been dealing with increasing difficulty breathing and a nonproductive cough since last night.  He states that she has felt hot like she has a fever, however they have not checked her temperature at home.  He states that he was sick with similar symptoms earlier in the week, but has since recovered.  He states he was tested for COVID which was negative.  EMS reports that they found patient short of breath at home with oxygen saturation of 77% on room air, she was subsequently placed on 6 L nasal cannula with improvement to 92%.      Physical Exam   Triage Vital Signs: ED Triage Vitals  Enc Vitals Group     BP      Pulse      Resp      Temp      Temp src      SpO2      Weight      Height      Head Circumference      Peak Flow      Pain Score      Pain Loc      Pain Edu?      Excl. in GC?     Most recent vital signs: Vitals:   05/19/22 1346 05/19/22 1400  BP: 127/71 127/68  Pulse: 84 79  Resp: (!) 33 (!) 29  Temp: (!) 100.8 F (38.2 C)   SpO2: 92% 92%    Constitutional: Somnolent but arousable to voice. Eyes: Conjunctivae are normal. Head: Atraumatic. Nose: No congestion/rhinnorhea. Mouth/Throat: Mucous membranes are moist.  Cardiovascular: Normal rate, regular rhythm. Grossly normal heart sounds.  2+ radial pulses bilaterally. Respiratory: Tachypneic with increased respiratory effort.  No retractions. Lungs CTAB. Gastrointestinal: Soft and nontender. No distention. Musculoskeletal: No  lower extremity tenderness nor edema.  Neurologic:  Normal speech and language. No gross focal neurologic deficits are appreciated.    ED Results / Procedures / Treatments   Labs (all labs ordered are listed, but only abnormal results are displayed) Labs Reviewed  RESP PANEL BY RT-PCR (FLU A&B, COVID) ARPGX2 - Abnormal; Notable for the following components:      Result Value   Influenza A by PCR POSITIVE (*)    All other components within normal limits  CBC WITH DIFFERENTIAL/PLATELET - Abnormal; Notable for the following components:   Hemoglobin 15.2 (*)    Lymphs Abs 0.5 (*)    All other components within normal limits  COMPREHENSIVE METABOLIC PANEL - Abnormal; Notable for the following components:   Glucose, Bld 144 (*)    BUN 26 (*)    Creatinine, Ser 1.04 (*)    Calcium 10.5 (*)    GFR, Estimated 55 (*)    All other components within normal limits  BRAIN NATRIURETIC PEPTIDE - Abnormal; Notable for the following components:   B Natriuretic Peptide 377.8 (*)    All other components within normal limits  BLOOD GAS, VENOUS - Abnormal; Notable for the  following components:   pH, Ven 7.46 (*)    pCO2, Ven 38 (*)    pO2, Ven 50 (*)    Acid-Base Excess 3.1 (*)    All other components within normal limits  LACTIC ACID, PLASMA - Abnormal; Notable for the following components:   Lactic Acid, Venous 2.2 (*)    All other components within normal limits  CULTURE, BLOOD (ROUTINE X 2)  CULTURE, BLOOD (ROUTINE X 2)  LACTIC ACID, PLASMA  URINALYSIS, ROUTINE W REFLEX MICROSCOPIC  PROCALCITONIN  TROPONIN I (HIGH SENSITIVITY)     EKG  ED ECG REPORT I, Chesley Noon, the attending physician, personally viewed and interpreted this ECG.   Date: 05/19/2022  EKG Time: 13:43  Rate: 84  Rhythm: normal sinus rhythm  Axis: Normal  Intervals:right bundle branch block  ST&T Change: None  RADIOLOGY Chest x-ray reviewed and interpreted by me with cardiomegaly and bilateral infiltrates  concerning for edema versus infection.  PROCEDURES:  Critical Care performed: Yes, see critical care procedure note(s)  .Critical Care  Performed by: Chesley Noon, MD Authorized by: Chesley Noon, MD   Critical care provider statement:    Critical care time (minutes):  30   Critical care was necessary to treat or prevent imminent or life-threatening deterioration of the following conditions:  Sepsis and respiratory failure   Critical care was time spent personally by me on the following activities:  Development of treatment plan with patient or surrogate, discussions with consultants, evaluation of patient's response to treatment, examination of patient, ordering and review of laboratory studies, ordering and review of radiographic studies, ordering and performing treatments and interventions, pulse oximetry, re-evaluation of patient's condition and review of old charts   I assumed direction of critical care for this patient from another provider in my specialty: no     Care discussed with: admitting provider      MEDICATIONS ORDERED IN ED: Medications  furosemide (LASIX) injection 40 mg (has no administration in time range)  lactated ringers infusion (has no administration in time range)  cefTRIAXone (ROCEPHIN) 2 g in sodium chloride 0.9 % 100 mL IVPB (has no administration in time range)  azithromycin (ZITHROMAX) 500 mg in sodium chloride 0.9 % 250 mL IVPB (has no administration in time range)     IMPRESSION / MDM / ASSESSMENT AND PLAN / ED COURSE  I reviewed the triage vital signs and the nursing notes.                              76 y.o. female with past medical history of hypertension, COPD, CAD, PE, and colon cancer who presents to the ED for a few days of cough now with increased difficulty breathing and decreased mental status.  Patient's presentation is most consistent with acute presentation with potential threat to life or bodily function.  Differential diagnosis  includes, but is not limited to, COPD exacerbation, CHF, ACS, PE, pneumonia, sepsis, electrolyte abnormality, AKI, UTI.  Patient ill-appearing but in no acute distress, vital signs remarkable for fever and tachypnea, oxygen saturations around 92% on room air and we will hold off on supplemental oxygen.  VBG is reassuring with no significant hypercapnia, EKG shows no evidence of arrhythmia or ischemia.  Chest x-ray with cardiomegaly and pulmonary edema, patient with no apparent history of CHF and we will diurese with IV Lasix.  I am also concerned for sepsis given her fever and tachypnea, will cover for pneumonia with  IV Rocephin and azithromycin.  Additional labs are remarkable for mild lactic acidosis, will trend.  No significant electrolyte abnormality or AKI noted, LFTs are unremarkable.  No significant anemia or leukocytosis noted.  Patient did come back positive for influenza A with negative procalcitonin, which could explain her symptoms rather than bacterial sepsis.  Patient does have a history of PE but is not currently anticoagulated, will check CTA of her chest in addition to CT head.  CT head is negative for acute process CTA results are pending.  Patient did drop oxygen saturations less than 88% and was placed on 2 L nasal cannula with improvement.  Case discussed with hospitalist for admission.  I was notified by radiology that patient has right lobar pulmonary embolism with evidence of mild right heart strain.  We will start patient on heparin, she remains hemodynamically stable at this time with no elevation in troponin, would see how she responds to heparin prior to consulting vascular surgery for thrombectomy.      FINAL CLINICAL IMPRESSION(S) / ED DIAGNOSES   Final diagnoses:  Acute respiratory failure with hypoxia (HCC)  Influenza A  Altered mental status, unspecified altered mental status type     Rx / DC Orders   ED Discharge Orders     None        Note:  This  document was prepared using Dragon voice recognition software and may include unintentional dictation errors.   Chesley Noon, MD 05/19/22 1525    Chesley Noon, MD 05/19/22 [phone]

## 2022-05-19 NOTE — ED Triage Notes (Signed)
[facility] C/O SOB since last night. O2 of 77% on RA, placed on 6L by EMS, O2 92%. Pt has been more lethargic than normal per husband. C/O cough x3 days.

## 2022-05-19 NOTE — H&P (Addendum)
History and Physical    Patient: Ann Silva ZOX:[id number] DOB: 11-21-1945 DOA: 05/19/2022 DOS: the patient was seen and examined on 05/19/2022 PCP: Danella Penton, MD  Patient coming from: Home  Chief Complaint:  Chief Complaint  Patient presents with   Shortness of Breath   HPI: Ann Silva is a 76 y.o. female with medical history significant for nicotine dependence, COPD, coronary artery disease, history of colon cancer status post partial colectomy, history of pulmonary embolism who presents to the ER via EMS for evaluation of shortness of breath and a cough which started 1 day prior to her admission. Patient states that she developed a cough productive of clear phlegm and over the last 24 hours has had shortness of breath both at rest and with exertion.  She has had a fever even though it was undocumented and per EMS she had room air pulse oximetry of 77% and was placed on 6 L of oxygen with improvement in her pulse oximetry to 92%. She has had a sick contact and states that her husband is currently recovering from an upper respiratory tract infection. She denies having any leg swelling, has not had any recent travels and is not on any hormonal therapy. She denies having any chest pain, no nausea, no vomiting, no dizziness, no lightheadedness, no abdominal pain, no changes in her bowel habits, no urinary symptoms, no focal deficits, no blurred vision. Patient had a fever upon arrival to the ER with a Tmax of 100.8, she is tachypneic with respiratory rate of about 30, lactic acid of 2.2 and a normal white cell count Patient's respiratory viral panel is positive for influenza A She had a CT angiogram done which showed moderate pulmonary emboli over the right middle lobe pulmonary artery and suggestion of a few small subsegmental emboli over the lower lobes bilaterally. Positive for acute PE with CT evidence of right heart strain (RV/LV Ratio = 1.07) consistent with at least submassive  (intermediate risk) PE. The presence of right heart strain has been associated with an increased risk of morbidity and mortality.   Review of Systems: As mentioned in the history of present illness. All other systems reviewed and are negative. Past Medical History:  Diagnosis Date   COPD (chronic obstructive pulmonary disease) (HCC)    Hypertension    Past Surgical History:  Procedure Laterality Date   JOINT REPLACEMENT     Social History:  has no history on file for tobacco use, alcohol use, and drug use.  Allergies  Allergen Reactions   Sulfa Antibiotics Shortness Of Breath    No family history on file.  Prior to Admission medications   Medication Sig Start Date End Date Taking? Authorizing Provider  cyclobenzaprine (FLEXERIL) 10 MG tablet Take 10 mg by mouth 3 (three) times daily. 02/15/22  Yes [provider]  metoprolol succinate (TOPROL-XL) 50 MG 24 hr tablet Take 50 mg by mouth daily. 05/08/22  Yes [provider]  montelukast (SINGULAIR) 10 MG tablet Take 10 mg by mouth daily. 04/30/22  Yes [provider]  omeprazole (PRILOSEC) 40 MG capsule Take 40 mg by mouth daily. 04/02/22  Yes [provider]  oxybutynin (DITROPAN) 5 MG tablet Take 5 mg by mouth 2 (two) times daily. 03/29/22  Yes [provider]  PARoxetine (PAXIL) 40 MG tablet Take 40 mg by mouth daily. 04/30/22  Yes [provider]  spironolactone (ALDACTONE) 25 MG tablet Take 25 mg by mouth daily. 04/18/22  Yes [provider]  temazepam (RESTORIL) 30 MG capsule Take 30 mg by mouth at bedtime as needed. 05/17/22  Yes [provider]  Monte Fantasia INHUB 250-50 MCG/ACT AEPB Inhale 1 puff into the lungs in the morning and at bedtime. 12/05/21  Yes [provider]    Physical Exam: Vitals:   05/19/22 1343 05/19/22 1346 05/19/22 1347 05/19/22 1400  BP:  127/71  127/68  Pulse:  84  79  Resp:  (!) 33  (!) 29  Temp:  (!) 100.8 F (38.2 C)     TempSrc:  Oral    SpO2: 92% 92%  92%  Weight:   80.7 kg   Height:   4\' 10"  (1.473 m)    Physical Exam Vitals and nursing note reviewed.  Constitutional:      Comments: Lethargic but arouses to verbal stimuli  HENT:     Head: Normocephalic and atraumatic.     Mouth/Throat:     Comments: Dry Eyes:     Pupils: Pupils are equal, round, and reactive to light.  Cardiovascular:     Rate and Rhythm: Normal rate and regular rhythm.  Pulmonary:     Effort: Pulmonary effort is normal.     Breath sounds: Normal breath sounds.  Abdominal:     General: Bowel sounds are normal.     Palpations: Abdomen is soft.  Musculoskeletal:     Cervical back: Normal range of motion and neck supple.     Right lower leg: Edema present.     Left lower leg: Edema present.  Skin:    General: Skin is warm and dry.  Neurological:     Comments: Unable to assess  Psychiatric:     Comments: Unable to assess     Data Reviewed: Relevant notes from primary care and specialist visits, past discharge summaries as available in EHR, including Care Everywhere. Prior diagnostic testing as pertinent to current admission diagnoses Updated medications and problem lists for reconciliation ED course, including vitals, labs, imaging, treatment and response to treatment Triage notes, nursing and pharmacy notes and ED provider's notes Notable results as noted in HPI Labs reviewed.  Procalcitonin less than 0.10, BNP 377, lactic acid 2.2, sodium 138, potassium 4.8, chloride 109, bicarb 23, glucose 144, BUN 26, creatinine 1.04, calcium 10.5, total protein 7.5, albumin 3.5, AST 41, ALT 25, alkaline phosphatase 99, total bilirubin 1.1, white count 8.3, hemoglobin 15.2, hematocrit 45.3, platelet count 2 1 Influenza A PCR is positive Chest x-ray reviewed by me shows Cardiomegaly with mild, diffuse bilateral interstitial pulmonary opacity, likely edema. No focal airspace opacity. CT angiogram shows moderate pulmonary emboli over  the right middle lobe pulmonary artery and suggestion of a few small subsegmental emboli over the lower lobes bilaterally. Positive for acute PE with CTevidence of right heart strain (RV/LV Ratio = 1.07) consistent with at least submassive (intermediate risk) PE. The presence of right heart strain has been associated with an increased risk of morbidity and mortality. 1.2 cm precarinal lymph node likely reactive. Aortic atherosclerosis. Atherosclerotic coronary artery disease. 2.2 cm left renal hypodensity likely a cyst incompletely evaluated on this noncontrast CT.  Emphysema. Aortic atherosclerosis. Atherosclerotic coronary artery disease. CT scan of the head without contrast shows no acute intracranial process Twelve-lead EKG reviewed by me shows sinus rhythm with multiple PVCs, incomplete right bundle branch block and low voltage QRS. There are no new results to review at this time.  Assessment and Plan: * Acute pulmonary embolism [facility]) Patient presents to the ER for evaluation of  shortness of breath both at rest and with exertion associated with a cough productive of clear phlegm and fever. She was hypoxic in the field with room air pulse oximetry of 77% She had a CT angiogram of the chest which showed moderate pulmonary emboli over the right middle lobe pulmonary artery and suggestion of a few small subsegmental emboli over the lower lobes bilaterally. Positive for acute PE with CTevidence of right heart strain (RV/LV Ratio = 1.07) consistent with at least submassive (intermediate risk) PE. Continue heparin drip initiated in the ER Place patient on IV fluid hydration Obtain 2D echocardiogram to assess LVEF and assess RV strain Vascular surgery consult for evaluation for possible thrombectomy Continue oxygen supplementation  Influenza due to identified novel influenza A virus with other respiratory manifestations Patient presents to the ER for evaluation of a fever, cough productive of clear  phlegm and shortness of breath and her influenza A PCR is positive We will start patient on Tamiflu 75 mg twice daily Supportive care with expectorants, antipyretics and nebulizer treatments  AKI (acute kidney injury) (HCC) Probably secondary to poor oral intake Patient has dry mucous membranes and poor skin turgor We will hydrate patient Hold spironolactone Repeat renal parameters in a.m.  Hypercalcemia Mild Serum calcium is 10.5 We will hydrate patient and repeat calcium levels in a.m.  Acute respiratory failure with hypoxia (HCC) Secondary to acute pulmonary embolism Patient was hypoxic in the field with room air pulse oximetry of 77%, she was tachypneic with increased work of breathing and is currently on 2 L of oxygen with pulse oximetry greater than 92% We will attempt to wean patient off oxygen once acute illness improves or resolves.   She will need to be assessed for home oxygen need prior to discharge  COPD (chronic obstructive pulmonary disease) (HCC) History of COPD not acutely exacerbated Continue as needed bronchodilator therapy and inhaled steroids      Advance Care Planning:   Code Status: Full Code   Consults: Vascular surgery  Family Communication: Greater than 50% of time was spent discussing patient's condition and plan of care with her and her husband at the bedside.  All questions and concerns have been addressed.  They verbalized understanding and agree with the plan.  Severity of Illness: The appropriate patient status for this patient is INPATIENT. Inpatient status is judged to be reasonable and necessary in order to provide the required intensity of service to ensure the patient's safety. The patient's presenting symptoms, physical exam findings, and initial radiographic and laboratory data in the context of their chronic comorbidities is felt to place them at high risk for further clinical deterioration. Furthermore, it is not anticipated that the  patient will be medically stable for discharge from the hospital within 2 midnights of admission.   * I certify that at the point of admission it is my clinical judgment that the patient will require inpatient hospital care spanning beyond 2 midnights from the point of admission due to high intensity of service, high risk for further deterioration and high frequency of surveillance required.*  Author: Lucile Shutters, MD 05/19/2022 5:06 PM  For on call review [email or web address].

## 2022-05-19 NOTE — Sepsis Progress Note (Signed)
Sepsis protocol monitored by eLink 

## 2022-05-19 NOTE — Assessment & Plan Note (Signed)
Mild Serum calcium is 10.5 We will hydrate patient and repeat calcium levels in a.m.

## 2022-05-19 NOTE — Assessment & Plan Note (Addendum)
Patient presents to the ER for evaluation of shortness of breath both at rest and with exertion associated with a cough productive of clear phlegm and fever. She was hypoxic in the field with room air pulse oximetry of 77% She had a CT angiogram of the chest which showed moderate pulmonary emboli over the right middle lobe pulmonary artery and suggestion of a few small subsegmental emboli over the lower lobes bilaterally. Positive for acute PE with CTevidence of right heart strain (RV/LV Ratio = 1.07) consistent with at least submassive (intermediate risk) PE. Continue heparin drip initiated in the ER Place patient on IV fluid hydration Obtain 2D echocardiogram to assess LVEF and assess RV strain Vascular surgery consult for evaluation for possible thrombectomy Continue oxygen supplementation Patient has a prior history of PE and will likely need long-term anticoagulation.

## 2022-05-19 NOTE — H&P (View-Only) (Signed)
[facility] Vascular Consult Note  MRN : [id number]  Ann Silva is a 76 y.o. (Dec 04, 1945) female who presents with chief complaint of PE Chief Complaint  Patient presents with   Shortness of Breath  .  History of Present Illness: Patient with PMH COPD, CAD, colon CA, history of PE 3-4 years ago placed on coumadin. Currently not on Coumadin. Presented with progressive shortness of breath. Productive cough and lethargy. Found to have Influenza A and Right Pulmonary emboli/ bilateral subsegmental PEs with Right heart strain.  Current Facility-Administered Medications  Medication Dose Route Frequency Provider Last Rate Last Admin   acetaminophen (TYLENOL) tablet 650 mg  650 mg Oral Q6H PRN Agbata, Tochukwu, MD       Or   acetaminophen (TYLENOL) suppository 650 mg  650 mg Rectal Q6H PRN Agbata, Tochukwu, MD       guaiFENesin (ROBITUSSIN) 100 MG/5ML liquid 5 mL  5 mL Oral Q4H PRN Agbata, Tochukwu, MD       heparin bolus via infusion 4,000 Units  4,000 Units Intravenous Once Sharen Hones, RPH       Followed by   heparin ADULT infusion 100 units/mL (25000 units/26mL)  1,000 Units/hr Intravenous Continuous Sharen Hones, RPH 10 mL/hr at 05/19/22 1629 1,000 Units/hr at 05/19/22 1629   ipratropium-albuterol (DUONEB) 0.5-2.5 (3) MG/3ML nebulizer solution 3 mL  3 mL Nebulization Q6H PRN Agbata, Tochukwu, MD       lactated ringers infusion   Intravenous Continuous Agbata, Tochukwu, MD 150 mL/hr at 05/19/22 1731 New Bag at 05/19/22 1731   [START ON 05/20/2022] mometasone-formoterol (DULERA) 200-5 MCG/ACT inhaler 2 puff  2 puff Inhalation BID Agbata, Tochukwu, MD       [START ON 05/20/2022] montelukast (SINGULAIR) tablet 10 mg  10 mg Oral Daily Agbata, Tochukwu, MD       ondansetron (ZOFRAN) tablet 4 mg  4 mg Oral Q6H PRN Agbata, Tochukwu, MD       Or   ondansetron (ZOFRAN) injection 4 mg  4 mg Intravenous Q6H PRN Agbata, Tochukwu, MD       oseltamivir (TAMIFLU)  capsule 75 mg  75 mg Oral Once Sharen Hones, RPH       Followed by   Melene Muller ON 05/20/2022] oseltamivir (TAMIFLU) capsule 30 mg  30 mg Oral BID Sharen Hones, RPH       oxybutynin (DITROPAN) tablet 5 mg  5 mg Oral BID Agbata, Tochukwu, MD       pantoprazole (PROTONIX) EC tablet 40 mg  40 mg Oral Daily Agbata, Tochukwu, MD       [START ON 05/20/2022] PARoxetine (PAXIL) tablet 40 mg  40 mg Oral Daily Agbata, Tochukwu, MD       Current Outpatient Medications  Medication Sig Dispense Refill   cyclobenzaprine (FLEXERIL) 10 MG tablet Take 10 mg by mouth 3 (three) times daily.     metoprolol succinate (TOPROL-XL) 50 MG 24 hr tablet Take 50 mg by mouth daily.     montelukast (SINGULAIR) 10 MG tablet Take 10 mg by mouth daily.     omeprazole (PRILOSEC) 40 MG capsule Take 40 mg by mouth daily.     oxybutynin (DITROPAN) 5 MG tablet Take 5 mg by mouth 2 (two) times daily.     PARoxetine (PAXIL) 40 MG tablet Take 40 mg by mouth daily.     spironolactone (ALDACTONE) 25 MG tablet Take 25 mg by mouth daily.     temazepam (RESTORIL) 30 MG capsule Take  30 mg by mouth at bedtime as needed.     WIXELA INHUB 250-50 MCG/ACT AEPB Inhale 1 puff into the lungs in the morning and at bedtime.      Past Medical History:  Diagnosis Date   COPD (chronic obstructive pulmonary disease) (HCC)    Hypertension     Past Surgical History:  Procedure Laterality Date   JOINT REPLACEMENT      Social History    Family History No family history on file.  Allergies  Allergen Reactions   Sulfa Antibiotics Shortness Of Breath     REVIEW OF SYSTEMS (Negative unless checked)  Constitutional: [] Weight loss  [] Fever  [] Chills Cardiac: [] Chest pain   [] Chest pressure   [] Palpitations   [x] Shortness of breath when laying flat   [x] Shortness of breath at rest   [] Shortness of breath with exertion. Vascular:  [] Pain in legs with walking   [] Pain in legs at rest   [] Pain in legs when laying flat   [] Claudication    [] Pain in feet when walking  [] Pain in feet at rest  [] Pain in feet when laying flat   [] History of DVT   [] Phlebitis   [] Swelling in legs   [] Varicose veins   [] Non-healing ulcers Pulmonary:   [] Uses home oxygen   [x] Productive cough   [] Hemoptysis   [] Wheeze  [] COPD   [] Asthma Neurologic:  [] Dizziness  [] Blackouts   [] Seizures   [] History of stroke   [] History of TIA  [] Aphasia   [] Temporary blindness   [] Dysphagia   [] Weakness or numbness in arms   [] Weakness or numbness in legs Musculoskeletal:  [] Arthritis   [] Joint swelling   [] Joint pain   [] Low back pain Hematologic:  [] Easy bruising  [] Easy bleeding   [] Hypercoagulable state   [] Anemic  [] Hepatitis Gastrointestinal:  [] Blood in stool   [] Vomiting blood  [] Gastroesophageal reflux/heartburn   [] Difficulty swallowing. Genitourinary:  [] Chronic kidney disease   [] Difficult urination  [] Frequent urination  [] Burning with urination   [] Blood in urine Skin:  [] Rashes   [] Ulcers   [] Wounds Psychological:  [] History of anxiety   []  History of major depression.  Physical Examination  Vitals:   05/19/22 1343 05/19/22 1346 05/19/22 1347 05/19/22 1400  BP:  127/71  127/68  Pulse:  84  79  Resp:  (!) 33  (!) 29  Temp:  (!) 100.8 F (38.2 C)    TempSrc:  Oral    SpO2: 92% 92%  92%  Weight:   80.7 kg   Height:   4\' 10"  (1.473 m)    Body mass index is 37.2 kg/m. Gen:  Lethargic Head: /AT, . Neck: Trachea midline.  No JVD.  Pulmonary:  Good air movement, respirations not labored, equal bilaterally.  Cardiac: RRR, normal S1, S2. Vascular:  Vessel Right Left  Radial Palpable Palpable  Ulnar    Brachial    Carotid    Aorta    Femoral Palpable Palpable  Popliteal    PT    DP     Gastrointestinal: soft, non-tender/non-distended. No guarding/reflex.  Musculoskeletal: M/S 5/5 throughout.  Extremities without ischemic changes.  No deformity or atrophy. 2+ edema.      CBC Lab Results  Component Value Date   WBC 8.3 05/19/2022    HGB 15.2 (H) 05/19/2022   HCT 45.3 05/19/2022   MCV 92.1 05/19/2022   PLT 201 05/19/2022    BMET    Component Value Date/Time   NA 138 05/19/2022 1348   K 4.8 05/19/2022  1348   CL 109 05/19/2022 1348   CO2 23 05/19/2022 1348   GLUCOSE 144 (H) 05/19/2022 1348   BUN 26 (H) 05/19/2022 1348   CREATININE 1.04 (H) 05/19/2022 1348   CALCIUM 10.5 (H) 05/19/2022 1348   GFRNONAA 55 (L) 05/19/2022 1348   Estimated Creatinine Clearance: 40.6 mL/min (A) (by C-G formula based on SCr of 1.04 mg/dL (H)).  COAG Lab Results  Component Value Date   INR 1.1 05/19/2022    Radiology CT Angio Chest PE W/Cm &/Or Wo Cm  Result Date: 05/19/2022 CLINICAL DATA:  Shortness of breath since last night with hypoxia. Possible pulmonary embolism. EXAM: CT ANGIOGRAPHY CHEST WITH CONTRAST TECHNIQUE: Multidetector CT imaging of the chest was performed using the standard protocol during bolus administration of intravenous contrast. Multiplanar CT image reconstructions and MIPs were obtained to evaluate the vascular anatomy. RADIATION DOSE REDUCTION: This exam was performed according to the departmental dose-optimization program which includes automated exposure control, adjustment of the mA and/or kV according to patient size and/or use of iterative reconstruction technique. CONTRAST:  75mL OMNIPAQUE IOHEXOL 350 MG/ML SOLN COMPARISON:  None Available. FINDINGS: Cardiovascular: Heart is normal size. Minimal calcified plaque over the 3 vessel coronary arteries. Mild calcified plaque over the thoracic aorta which is normal in caliber. Pulmonary arterial system is well opacified and demonstrates moderate emboli over the right middle lobe pulmonary artery and suggestion of a few small subsegmental emboli over the lower lobes bilaterally. RV/LV ratio is 1.07. Remaining vascular structures are unremarkable. Mediastinum/Nodes: 1.2 cm precarinal lymph node. No other enlarged mediastinal or hilar lymph nodes. Remaining mediastinal  structures are unremarkable. Lungs/Pleura: Lungs are adequately inflated and demonstrate mild posterior left basilar atelectasis. Dependent atelectasis with paraseptal emphysematous disease over the posterior right upper lobe. Minimal paraseptal emphysematous disease over the left apex. No effusion. No pneumothorax. Airways are normal. Upper Abdomen: Calcified plaque over the abdominal aorta. Previous cholecystectomy. Surgical clips adjacent the colon in the right abdomen. 2.2 cm hypodensity over the left kidney with Hounsfield unit measurements 15 likely a cyst. Musculoskeletal: Mild degenerative change of the spine. Partially visualized posterior fusion hardware over the lumbar spine Review of the MIP images confirms the above findings. IMPRESSION: 1. Moderate pulmonary emboli over the right middle lobe pulmonary artery and suggestion of a few small subsegmental emboli over the lower lobes bilaterally. Positive for acute PE with CTevidence of right heart strain (RV/LV Ratio = 1.07) consistent with at least submassive (intermediate risk) PE. The presence of right heart strain has been associated with an increased risk of morbidity and mortality. 2. 1.2 cm precarinal lymph node likely reactive. 3. Aortic atherosclerosis. Atherosclerotic coronary artery disease. 4. 2.2 cm left renal hypodensity likely a cyst incompletely evaluated on this noncontrast CT. Recommend ultrasound for further evaluation on an elective basis. 5. Emphysema. Aortic atherosclerosis. Atherosclerotic coronary artery disease. Aortic Atherosclerosis (ICD10-I70.0) and Emphysema (ICD10-J43.9). Critical Value/emergent results were called by telephone at the time of interpretation on 05/19/2022 at 3:28 pm to provider [facility] , who verbally acknowledged these results. Electronically Signed   By: Elberta Fortis M.D.   On: 05/19/2022 15:28   CT Head Wo Contrast  Result Date: 05/19/2022 CLINICAL DATA:  Short of breath, hypoxia, altered level of  consciousness EXAM: CT HEAD WITHOUT CONTRAST TECHNIQUE: Contiguous axial images were obtained from the base of the skull through the vertex without intravenous contrast. RADIATION DOSE REDUCTION: This exam was performed according to the departmental dose-optimization program which includes automated exposure control, adjustment of  the mA and/or kV according to patient size and/or use of iterative reconstruction technique. COMPARISON:  None Available. FINDINGS: Brain: No acute infarct or hemorrhage. Lateral ventricles and midline structures are unremarkable. No acute extra-axial fluid collections. No mass effect. Vascular: No hyperdense vessel or unexpected calcification. Skull: Normal. Negative for fracture or focal lesion. Sinuses/Orbits: No acute finding. Other: None. IMPRESSION: 1. No acute intracranial process. Electronically Signed   By: Sharlet Salina M.D.   On: 05/19/2022 15:16   DG Chest Portable 1 View  Result Date: 05/19/2022 CLINICAL DATA:  Shortness of breath EXAM: PORTABLE CHEST 1 VIEW COMPARISON:  None Available. FINDINGS: Cardiomegaly. Mild, diffuse bilateral interstitial pulmonary opacity. The visualized skeletal structures are unremarkable. IMPRESSION: Cardiomegaly with mild, diffuse bilateral interstitial pulmonary opacity, likely edema. No focal airspace opacity. Electronically Signed   By: Jearld Lesch M.D.   On: 05/19/2022 14:15      Assessment/Plan 1. Right Pulmonary emboli; Bilateral subsegmental PEs with Right heart strain 2. Heparin gtt and supportive care Will proceed with Pulmonary thrombectomy tomorrow 3. Discussed with patients daughter in law at bedside   Bertram Denver, MD  05/19/2022 6:51 PM    This note was created with Dragon medical transcription system.  Any error is purely unintentional

## 2022-05-19 NOTE — Consult Note (Signed)
[facility] Vascular Consult Note  MRN : [id number]  Ann Silva is a 76 y.o. (Dec 04, 1945) female who presents with chief complaint of PE Chief Complaint  Patient presents with   Shortness of Breath  .  History of Present Illness: Patient with PMH COPD, CAD, colon CA, history of PE 3-4 years ago placed on coumadin. Currently not on Coumadin. Presented with progressive shortness of breath. Productive cough and lethargy. Found to have Influenza A and Right Pulmonary emboli/ bilateral subsegmental PEs with Right heart strain.  Current Facility-Administered Medications  Medication Dose Route Frequency Provider Last Rate Last Admin   acetaminophen (TYLENOL) tablet 650 mg  650 mg Oral Q6H PRN Agbata, Tochukwu, MD       Or   acetaminophen (TYLENOL) suppository 650 mg  650 mg Rectal Q6H PRN Agbata, Tochukwu, MD       guaiFENesin (ROBITUSSIN) 100 MG/5ML liquid 5 mL  5 mL Oral Q4H PRN Agbata, Tochukwu, MD       heparin bolus via infusion 4,000 Units  4,000 Units Intravenous Once Sharen Hones, RPH       Followed by   heparin ADULT infusion 100 units/mL (25000 units/26mL)  1,000 Units/hr Intravenous Continuous Sharen Hones, RPH 10 mL/hr at 05/19/22 1629 1,000 Units/hr at 05/19/22 1629   ipratropium-albuterol (DUONEB) 0.5-2.5 (3) MG/3ML nebulizer solution 3 mL  3 mL Nebulization Q6H PRN Agbata, Tochukwu, MD       lactated ringers infusion   Intravenous Continuous Agbata, Tochukwu, MD 150 mL/hr at 05/19/22 1731 New Bag at 05/19/22 1731   [START ON 05/20/2022] mometasone-formoterol (DULERA) 200-5 MCG/ACT inhaler 2 puff  2 puff Inhalation BID Agbata, Tochukwu, MD       [START ON 05/20/2022] montelukast (SINGULAIR) tablet 10 mg  10 mg Oral Daily Agbata, Tochukwu, MD       ondansetron (ZOFRAN) tablet 4 mg  4 mg Oral Q6H PRN Agbata, Tochukwu, MD       Or   ondansetron (ZOFRAN) injection 4 mg  4 mg Intravenous Q6H PRN Agbata, Tochukwu, MD       oseltamivir (TAMIFLU)  capsule 75 mg  75 mg Oral Once Sharen Hones, RPH       Followed by   Melene Muller ON 05/20/2022] oseltamivir (TAMIFLU) capsule 30 mg  30 mg Oral BID Sharen Hones, RPH       oxybutynin (DITROPAN) tablet 5 mg  5 mg Oral BID Agbata, Tochukwu, MD       pantoprazole (PROTONIX) EC tablet 40 mg  40 mg Oral Daily Agbata, Tochukwu, MD       [START ON 05/20/2022] PARoxetine (PAXIL) tablet 40 mg  40 mg Oral Daily Agbata, Tochukwu, MD       Current Outpatient Medications  Medication Sig Dispense Refill   cyclobenzaprine (FLEXERIL) 10 MG tablet Take 10 mg by mouth 3 (three) times daily.     metoprolol succinate (TOPROL-XL) 50 MG 24 hr tablet Take 50 mg by mouth daily.     montelukast (SINGULAIR) 10 MG tablet Take 10 mg by mouth daily.     omeprazole (PRILOSEC) 40 MG capsule Take 40 mg by mouth daily.     oxybutynin (DITROPAN) 5 MG tablet Take 5 mg by mouth 2 (two) times daily.     PARoxetine (PAXIL) 40 MG tablet Take 40 mg by mouth daily.     spironolactone (ALDACTONE) 25 MG tablet Take 25 mg by mouth daily.     temazepam (RESTORIL) 30 MG capsule Take  30 mg by mouth at bedtime as needed.     WIXELA INHUB 250-50 MCG/ACT AEPB Inhale 1 puff into the lungs in the morning and at bedtime.      Past Medical History:  Diagnosis Date   COPD (chronic obstructive pulmonary disease) (HCC)    Hypertension     Past Surgical History:  Procedure Laterality Date   JOINT REPLACEMENT      Social History    Family History No family history on file.  Allergies  Allergen Reactions   Sulfa Antibiotics Shortness Of Breath     REVIEW OF SYSTEMS (Negative unless checked)  Constitutional: [] Weight loss  [] Fever  [] Chills Cardiac: [] Chest pain   [] Chest pressure   [] Palpitations   [x] Shortness of breath when laying flat   [x] Shortness of breath at rest   [] Shortness of breath with exertion. Vascular:  [] Pain in legs with walking   [] Pain in legs at rest   [] Pain in legs when laying flat   [] Claudication    [] Pain in feet when walking  [] Pain in feet at rest  [] Pain in feet when laying flat   [] History of DVT   [] Phlebitis   [] Swelling in legs   [] Varicose veins   [] Non-healing ulcers Pulmonary:   [] Uses home oxygen   [x] Productive cough   [] Hemoptysis   [] Wheeze  [] COPD   [] Asthma Neurologic:  [] Dizziness  [] Blackouts   [] Seizures   [] History of stroke   [] History of TIA  [] Aphasia   [] Temporary blindness   [] Dysphagia   [] Weakness or numbness in arms   [] Weakness or numbness in legs Musculoskeletal:  [] Arthritis   [] Joint swelling   [] Joint pain   [] Low back pain Hematologic:  [] Easy bruising  [] Easy bleeding   [] Hypercoagulable state   [] Anemic  [] Hepatitis Gastrointestinal:  [] Blood in stool   [] Vomiting blood  [] Gastroesophageal reflux/heartburn   [] Difficulty swallowing. Genitourinary:  [] Chronic kidney disease   [] Difficult urination  [] Frequent urination  [] Burning with urination   [] Blood in urine Skin:  [] Rashes   [] Ulcers   [] Wounds Psychological:  [] History of anxiety   []  History of major depression.  Physical Examination  Vitals:   05/19/22 1343 05/19/22 1346 05/19/22 1347 05/19/22 1400  BP:  127/71  127/68  Pulse:  84  79  Resp:  (!) 33  (!) 29  Temp:  (!) 100.8 F (38.2 C)    TempSrc:  Oral    SpO2: 92% 92%  92%  Weight:   80.7 kg   Height:   4\' 10"  (1.473 m)    Body mass index is 37.2 kg/m. Gen:  Lethargic Head: /AT, . Neck: Trachea midline.  No JVD.  Pulmonary:  Good air movement, respirations not labored, equal bilaterally.  Cardiac: RRR, normal S1, S2. Vascular:  Vessel Right Left  Radial Palpable Palpable  Ulnar    Brachial    Carotid    Aorta    Femoral Palpable Palpable  Popliteal    PT    DP     Gastrointestinal: soft, non-tender/non-distended. No guarding/reflex.  Musculoskeletal: M/S 5/5 throughout.  Extremities without ischemic changes.  No deformity or atrophy. 2+ edema.      CBC Lab Results  Component Value Date   WBC 8.3 05/19/2022    HGB 15.2 (H) 05/19/2022   HCT 45.3 05/19/2022   MCV 92.1 05/19/2022   PLT 201 05/19/2022    BMET    Component Value Date/Time   NA 138 05/19/2022 1348   K 4.8 05/19/2022  1348   CL 109 05/19/2022 1348   CO2 23 05/19/2022 1348   GLUCOSE 144 (H) 05/19/2022 1348   BUN 26 (H) 05/19/2022 1348   CREATININE 1.04 (H) 05/19/2022 1348   CALCIUM 10.5 (H) 05/19/2022 1348   GFRNONAA 55 (L) 05/19/2022 1348   Estimated Creatinine Clearance: 40.6 mL/min (A) (by C-G formula based on SCr of 1.04 mg/dL (H)).  COAG Lab Results  Component Value Date   INR 1.1 05/19/2022    Radiology CT Angio Chest PE W/Cm &/Or Wo Cm  Result Date: 05/19/2022 CLINICAL DATA:  Shortness of breath since last night with hypoxia. Possible pulmonary embolism. EXAM: CT ANGIOGRAPHY CHEST WITH CONTRAST TECHNIQUE: Multidetector CT imaging of the chest was performed using the standard protocol during bolus administration of intravenous contrast. Multiplanar CT image reconstructions and MIPs were obtained to evaluate the vascular anatomy. RADIATION DOSE REDUCTION: This exam was performed according to the departmental dose-optimization program which includes automated exposure control, adjustment of the mA and/or kV according to patient size and/or use of iterative reconstruction technique. CONTRAST:  75mL OMNIPAQUE IOHEXOL 350 MG/ML SOLN COMPARISON:  None Available. FINDINGS: Cardiovascular: Heart is normal size. Minimal calcified plaque over the 3 vessel coronary arteries. Mild calcified plaque over the thoracic aorta which is normal in caliber. Pulmonary arterial system is well opacified and demonstrates moderate emboli over the right middle lobe pulmonary artery and suggestion of a few small subsegmental emboli over the lower lobes bilaterally. RV/LV ratio is 1.07. Remaining vascular structures are unremarkable. Mediastinum/Nodes: 1.2 cm precarinal lymph node. No other enlarged mediastinal or hilar lymph nodes. Remaining mediastinal  structures are unremarkable. Lungs/Pleura: Lungs are adequately inflated and demonstrate mild posterior left basilar atelectasis. Dependent atelectasis with paraseptal emphysematous disease over the posterior right upper lobe. Minimal paraseptal emphysematous disease over the left apex. No effusion. No pneumothorax. Airways are normal. Upper Abdomen: Calcified plaque over the abdominal aorta. Previous cholecystectomy. Surgical clips adjacent the colon in the right abdomen. 2.2 cm hypodensity over the left kidney with Hounsfield unit measurements 15 likely a cyst. Musculoskeletal: Mild degenerative change of the spine. Partially visualized posterior fusion hardware over the lumbar spine Review of the MIP images confirms the above findings. IMPRESSION: 1. Moderate pulmonary emboli over the right middle lobe pulmonary artery and suggestion of a few small subsegmental emboli over the lower lobes bilaterally. Positive for acute PE with CTevidence of right heart strain (RV/LV Ratio = 1.07) consistent with at least submassive (intermediate risk) PE. The presence of right heart strain has been associated with an increased risk of morbidity and mortality. 2. 1.2 cm precarinal lymph node likely reactive. 3. Aortic atherosclerosis. Atherosclerotic coronary artery disease. 4. 2.2 cm left renal hypodensity likely a cyst incompletely evaluated on this noncontrast CT. Recommend ultrasound for further evaluation on an elective basis. 5. Emphysema. Aortic atherosclerosis. Atherosclerotic coronary artery disease. Aortic Atherosclerosis (ICD10-I70.0) and Emphysema (ICD10-J43.9). Critical Value/emergent results were called by telephone at the time of interpretation on 05/19/2022 at 3:28 pm to provider [facility] , who verbally acknowledged these results. Electronically Signed   By: Elberta Fortis M.D.   On: 05/19/2022 15:28   CT Head Wo Contrast  Result Date: 05/19/2022 CLINICAL DATA:  Short of breath, hypoxia, altered level of  consciousness EXAM: CT HEAD WITHOUT CONTRAST TECHNIQUE: Contiguous axial images were obtained from the base of the skull through the vertex without intravenous contrast. RADIATION DOSE REDUCTION: This exam was performed according to the departmental dose-optimization program which includes automated exposure control, adjustment of  the mA and/or kV according to patient size and/or use of iterative reconstruction technique. COMPARISON:  None Available. FINDINGS: Brain: No acute infarct or hemorrhage. Lateral ventricles and midline structures are unremarkable. No acute extra-axial fluid collections. No mass effect. Vascular: No hyperdense vessel or unexpected calcification. Skull: Normal. Negative for fracture or focal lesion. Sinuses/Orbits: No acute finding. Other: None. IMPRESSION: 1. No acute intracranial process. Electronically Signed   By: Sharlet Salina M.D.   On: 05/19/2022 15:16   DG Chest Portable 1 View  Result Date: 05/19/2022 CLINICAL DATA:  Shortness of breath EXAM: PORTABLE CHEST 1 VIEW COMPARISON:  None Available. FINDINGS: Cardiomegaly. Mild, diffuse bilateral interstitial pulmonary opacity. The visualized skeletal structures are unremarkable. IMPRESSION: Cardiomegaly with mild, diffuse bilateral interstitial pulmonary opacity, likely edema. No focal airspace opacity. Electronically Signed   By: Jearld Lesch M.D.   On: 05/19/2022 14:15      Assessment/Plan 1. Right Pulmonary emboli; Bilateral subsegmental PEs with Right heart strain 2. Heparin gtt and supportive care Will proceed with Pulmonary thrombectomy tomorrow 3. Discussed with patients daughter in law at bedside   Bertram Denver, MD  05/19/2022 6:51 PM    This note was created with Dragon medical transcription system.  Any error is purely unintentional

## 2022-05-19 NOTE — ED Notes (Signed)
Purewick placed at this time.

## 2022-05-20 ENCOUNTER — Inpatient Hospital Stay
Admit: 2022-05-20 | Discharge: 2022-05-20 | Disposition: A | Discharge status: Home / Self Care | Payer: Medicare PPO | Attending: Internal Medicine | Admitting: Internal Medicine

## 2022-05-20 ENCOUNTER — Encounter
Admission: EM | Disposition: A | Discharge status: Home Health | Payer: Self-pay | Source: Home / Self Care | Attending: Obstetrics and Gynecology

## 2022-05-20 ENCOUNTER — Other Ambulatory Visit: Payer: Self-pay

## 2022-05-20 ENCOUNTER — Encounter: Payer: Self-pay | Admitting: Internal Medicine

## 2022-05-20 DIAGNOSIS — Z720 Tobacco use: Secondary | ICD-10-CM | POA: Insufficient documentation

## 2022-05-20 DIAGNOSIS — I10 Essential (primary) hypertension: Secondary | ICD-10-CM | POA: Insufficient documentation

## 2022-05-20 DIAGNOSIS — G8929 Other chronic pain: Secondary | ICD-10-CM | POA: Insufficient documentation

## 2022-05-20 DIAGNOSIS — E213 Hyperparathyroidism, unspecified: Secondary | ICD-10-CM | POA: Insufficient documentation

## 2022-05-20 DIAGNOSIS — E669 Obesity, unspecified: Secondary | ICD-10-CM | POA: Insufficient documentation

## 2022-05-20 DIAGNOSIS — J9601 Acute respiratory failure with hypoxia: Secondary | ICD-10-CM | POA: Diagnosis not present

## 2022-05-20 HISTORY — PX: PULMONARY THROMBECTOMY: CATH118295

## 2022-05-20 LAB — BASIC METABOLIC PANEL
Anion gap: 9 (ref 5–15)
BUN: 23 mg/dL (ref 8–23)
CO2: 24 mmol/L (ref 22–32)
Calcium: 9.7 mg/dL (ref 8.9–10.3)
Chloride: 103 mmol/L (ref 98–111)
Creatinine, Ser: 0.9 mg/dL (ref 0.44–1.00)
GFR, Estimated: >60 mL/min (ref >60)
Glucose, Bld: 128 mg/dL — ABNORMAL HIGH (ref 70–99)
Potassium: 3.5 mmol/L (ref 3.5–5.1)
Sodium: 136 mmol/L (ref 135–145)

## 2022-05-20 LAB — CBC
HCT: 43.3 % (ref 36.0–46.0)
Hemoglobin: 14.4 g/dL (ref 12.0–15.0)
MCH: 30.7 pg (ref 26.0–34.0)
MCHC: 33.3 g/dL (ref 30.0–36.0)
MCV: 92.3 fL (ref 80.0–100.0)
Platelets: 159 10*3/uL (ref 150–400)
RBC: 4.69 MIL/uL (ref 3.87–5.11)
RDW: 15.4 % (ref 11.5–15.5)
WBC: 5.7 10*3/uL (ref 4.0–10.5)
nRBC: 0 % (ref 0.0–0.2)

## 2022-05-20 LAB — HEPARIN LEVEL (UNFRACTIONATED)
Heparin Unfractionated: 0.56 IU/mL (ref 0.30–0.70)
Heparin Unfractionated: 0.53 IU/mL (ref 0.30–0.70)

## 2022-05-20 LAB — ECHOCARDIOGRAM COMPLETE
Area-P 1/2: 3.39 cm2
Height: 59 in
S' Lateral: 2.4 cm
Weight: 2903.02 oz

## 2022-05-20 SURGERY — PULMONARY THROMBECTOMY
Anesthesia: Moderate Sedation | Laterality: Right

## 2022-05-20 MED ORDER — FAMOTIDINE 20 MG PO TABS: 40.0000 mg | ORAL_TABLET | Freq: Once | ORAL | Status: DC | PRN

## 2022-05-20 MED ORDER — MIDAZOLAM HCL 2 MG/ML PO SYRP: 8.0000 mg | ORAL_SOLUTION | Freq: Once | ORAL | Status: DC | PRN

## 2022-05-20 MED ORDER — ONDANSETRON HCL 4 MG/2ML IJ SOLN: 4.0000 mg | Freq: Four times a day (QID) | INTRAMUSCULAR | Status: DC | PRN

## 2022-05-20 MED ORDER — HYDROMORPHONE HCL 1 MG/ML IJ SOLN: 1.0000 mg | Freq: Once | INTRAMUSCULAR | Status: DC | PRN

## 2022-05-20 MED ORDER — MIDAZOLAM HCL 5 MG/5ML IJ SOLN
INTRAMUSCULAR | Status: AC
  Filled 2022-05-20: qty 5

## 2022-05-20 MED ORDER — CEFAZOLIN SODIUM-DEXTROSE 2-4 GM/100ML-% IV SOLN
INTRAVENOUS | Status: AC
  Filled 2022-05-20: qty 100

## 2022-05-20 MED ORDER — METHYLPREDNISOLONE SODIUM SUCC 125 MG IJ SOLR: 125.0000 mg | Freq: Once | INTRAMUSCULAR | Status: DC | PRN

## 2022-05-20 MED ORDER — FENTANYL CITRATE (PF) 100 MCG/2ML IJ SOLN
INTRAMUSCULAR | Status: AC
  Filled 2022-05-20: qty 2

## 2022-05-20 MED ORDER — METOPROLOL SUCCINATE ER 50 MG PO TB24
50.0000 mg | ORAL_TABLET | Freq: Every day | ORAL | Status: DC
  Administered 2022-05-20 – 2022-05-21 (×2): 50 mg via ORAL
  Filled 2022-05-20 (×4): qty 1

## 2022-05-20 MED ORDER — CEFAZOLIN SODIUM-DEXTROSE 2-4 GM/100ML-% IV SOLN
2.0000 g | INTRAVENOUS | Status: DC
  Filled 2022-05-20: qty 100

## 2022-05-20 MED ORDER — MIDAZOLAM HCL 2 MG/2ML IJ SOLN
INTRAMUSCULAR | Status: DC | PRN
  Administered 2022-05-20: 2 mg via INTRAVENOUS

## 2022-05-20 MED ORDER — SODIUM CHLORIDE 0.9 % IV SOLN: INTRAVENOUS | Status: DC

## 2022-05-20 MED ORDER — DIPHENHYDRAMINE HCL 50 MG/ML IJ SOLN: 50.0000 mg | Freq: Once | INTRAMUSCULAR | Status: DC | PRN

## 2022-05-20 MED ORDER — CEFAZOLIN SODIUM-DEXTROSE 1-4 GM/50ML-% IV SOLN
INTRAVENOUS | Status: DC | PRN
  Administered 2022-05-20: 2 g via INTRAVENOUS

## 2022-05-20 MED ORDER — HEPARIN SODIUM (PORCINE) 1000 UNIT/ML IJ SOLN
INTRAMUSCULAR | Status: AC
  Filled 2022-05-20: qty 10

## 2022-05-20 MED ORDER — TEMAZEPAM 15 MG PO CAPS
30.0000 mg | ORAL_CAPSULE | Freq: Once | ORAL | Status: AC
  Administered 2022-05-20: 30 mg via ORAL
  Filled 2022-05-20: qty 2

## 2022-05-20 MED ORDER — FENTANYL CITRATE (PF) 100 MCG/2ML IJ SOLN
INTRAMUSCULAR | Status: DC | PRN
  Administered 2022-05-20: 50 ug via INTRAVENOUS

## 2022-05-20 SURGICAL SUPPLY — 18 items
CANISTER PENUMBRA ENGINE (MISCELLANEOUS) IMPLANT
CATH ANGIO 5F PIGTAIL 100CM (CATHETERS) IMPLANT
CATH INDIGO 12XTORQ 100 (CATHETERS) IMPLANT
CATH INDIGO SEP 12 (CATHETERS) IMPLANT
CATH INFINITI JR4 5F (CATHETERS) IMPLANT
CATH SELECT BERN TIP 5F 130 (CATHETERS) IMPLANT
CLOSURE PERCLOSE PROSTYLE (VASCULAR PRODUCTS) IMPLANT
COVER PROBE ULTRASOUND 5X96 (MISCELLANEOUS) IMPLANT
GLIDEWIRE ADV .035X180CM (WIRE) IMPLANT
PACK ANGIOGRAPHY (CUSTOM PROCEDURE TRAY) ×1 IMPLANT
SHEATH BRITE TIP 6FRX11 (SHEATH) IMPLANT
SHEATH PINNACLE 11FRX10 (SHEATH) IMPLANT
SUT MNCRL AB 4-0 PS2 18 (SUTURE) IMPLANT
SUT PROLENE 0 CT 1 30 (SUTURE) IMPLANT
SYR MEDRAD MARK 7 150ML (SYRINGE) IMPLANT
TUBING CONTRAST HIGH PRESS 72 (TUBING) IMPLANT
WIRE GUIDERIGHT .035X150 (WIRE) IMPLANT
WIRE SUPRACORE 300CM (MISCELLANEOUS) IMPLANT

## 2022-05-20 NOTE — Progress Notes (Signed)
*  PRELIMINARY RESULTS* Echocardiogram 2D Echocardiogram has been performed.  Joanette Gula Axl Rodino 05/20/2022, 8:09 AM

## 2022-05-20 NOTE — Op Note (Signed)
[facility]  Percutaneous Study/Intervention Procedural Note   Date of Surgery: 05/20/2022,2:14 PM  Surgeon: Leotis Pain  Pre-operative Diagnosis: Symptomatic bilateral pulmonary emboli  Post-operative diagnosis:  Same  Procedure(s) Performed:  1.  Contrast injection right heart  2.  Mechanical thrombectomy using the penumbra CAT 12 catheter to the left lower lobe pulmonary artery  3.  Mechanical thrombectomy using the penumbra CAT 12 catheter to the right middle and lower lobe pulmonary arteries  4.  Selective catheter placement right lower lobe, middle lobe, and upper lobe pulmonary arteries  5.  Selective catheter placement left lower lobe and upper lobe pulmonary arteries    Anesthesia: Conscious sedation was administered under my direct supervision by the interventional radiology RN. IV Versed plus fentanyl were utilized. Continuous ECG, pulse oximetry and blood pressure was monitored throughout the entire procedure.  Versed and fentanyl were administered intravenously.  Conscious sedation was administered for a total of 38 minutes using 2 mg of Versed and 50 mcg of Fentanyl.  EBL: 200 cc  Sheath: 11 French right femoral vein  Contrast: 50 cc   Fluoroscopy Time: 7.2 minutes  Indications:  Patient presents with pulmonary emboli. The patient is symptomatic with hypoxemia and dyspnea on exertion.  There is evidence of right heart strain on the CT angiogram. The patient is otherwise a good candidate for intervention and even the long-term benefits pulmonary angiography with thrombectomy is offered. The risks and benefits are reviewed long-term benefits are discussed. All questions are answered patient agrees to proceed.  Procedure:  Ann Silva a 76 y.o. female who was identified and appropriate procedural time out was performed.  The patient was then placed supine on the table and prepped and draped in the usual sterile fashion.  Ultrasound was used to  evaluate the right lower common femoral vein.  It was patent, as it was echolucent and compressible.  A digital ultrasound image was acquired for the permanent record.  A Seldinger needle was used to access the right common femoral vein under direct ultrasound guidance.  A 0.035 J wire was advanced without resistance and a 5Fr sheath was placed.  I then placed a ProGlide device in a previously closed fashion and then upsized to an 11 [place] sheath.    The wire and pigtail catheter were then negotiated into the right atrium and bolus injection of contrast was utilized to demonstrate the right ventricle and the pulmonary artery outflow. The advantage wire and JR 4 catheter were then negotiated into the main pulmonary artery where hand injection of contrast was utilized to demonstrate the pulmonary arteries and confirm the locations of the pulmonary emboli.  I advanced first to the left lower lobe pulmonary artery for selective imaging and then advanced a JR4 catheter in the left upper lobe pulmonary artery.  There was a small to medium amount of thrombus in the left lower lobe but no obvious thrombus in the left upper lobe.  Image quality was fairly poor due to patient motion and respiratory artifact.  I then advanced the JR4 catheter over to the right side with the help of the advantage wire.  I first cannulated the right upper lobe, and then the right middle and lower lobe and perform selective imaging. There is a small amount of thrombus in the right lower lobe pulmonary artery and a small to medium amount of thrombus in the right middle lobe pulmonary artery.  The right upper lobe pulmonary artery did not demonstrate any obvious thrombus.  Image quality was fairly poor due to patient motion and respiratory artifact  3000 Units of heparin was then given and allowed to circulate.   The Penumbra Cat 12 catheter was then advanced up into the pulmonary vasculature. The right lung was addressed first. Catheter  was negotiated into the right lower lobe pulmonary artery and mechanical thrombectomy was performed with the help of the separator. Follow-up imaging demonstrated a good result and therefore the catheter was renegotiated into the right middle lobe pulmonary artery and again mechanical thrombectomy was performed. Passes were made with both the Penumbra catheter itself as well as introducing the separator. Follow-up imaging was then performed.  The Penumbra Cat 12 catheter was then negotiated to the opposite side. The left lung was then addressed. Catheter was negotiated into the left lower lobe pulmonary artery and again mechanical thrombectomy was performed. Passes were made with both the Penumbra catheter itself as well as introducing the separator. Follow-up imaging was then performed.  After review these images wires were reintroduced and the catheters removed. Then, the sheath is then pulled and the Pro-glide device was secured and pressure is held. A sterile dressing was placed    Findings:   Right heart imaging:  Right atrium and right ventricle and the pulmonary outflow tract appears fairly normal  Right lung: There is a small amount of thrombus in the right lower lobe pulmonary artery and a small to medium amount of thrombus in the right middle lobe pulmonary artery.  The right upper lobe pulmonary artery did not demonstrate any obvious thrombus.  Image quality was fairly poor due to patient motion and respiratory artifact  Left lung:  There was a small to medium amount of thrombus in the left lower lobe but no obvious thrombus in the left upper lobe.  Image quality was fairly poor due to patient motion and respiratory artifact.    Disposition: Patient was taken to the recovery room in stable condition having tolerated the procedure well.  Ann Silva 05/20/2022,2:14 PM

## 2022-05-20 NOTE — Consult Note (Signed)
ANTICOAGULATION CONSULT NOTE - Initial Consult  Pharmacy Consult for heparin infusion Indication: pulmonary embolus  Allergies  Allergen Reactions   Sulfa Antibiotics Shortness Of Breath    Patient Measurements: Height: 4\' 11"  (149.9 cm) Weight: 80.5 kg (177 lb 6.4 oz) IBW/kg (Calculated) : 43.2 Heparin Dosing Weight: 60 kg   Vital Signs: Temp: 98 F (36.7 C) (11/13 0015) Temp Source: Oral (11/13 0015) BP: 105/67 (11/13 0015) Pulse Rate: 68 (11/13 0015)  Labs: Recent Labs    05/19/22 1348 05/19/22 1549 05/19/22 1623 05/20/22 0037  HGB 15.2*  --   --  14.4  HCT 45.3  --   --  43.3  PLT 201  --   --  159  APTT  --  30  --   --   LABPROT  --  13.8  --   --   INR  --  1.1  --   --   HEPARINUNFRC  --   --   --  0.56  CREATININE 1.04*  --   --  0.90  TROPONINIHS 4  --  5  --      Estimated Creatinine Clearance: 48 mL/min (by C-G formula based on SCr of 0.9 mg/dL).   Medical History: Past Medical History:  Diagnosis Date   COPD (chronic obstructive pulmonary disease) (HCC)    Hypertension     Medications:  No prior anticoagulation noted   Assessment: 76 y.o. female with past medical history of hypertension, COPD, CAD, PE no longer on anticoagulation, and colon cancer who presents to the ED complaining of shortness of breath. CT CT showing moderate PE with evidence of right heart strain. Pharmacy consulted for initiation and management of heparin infusion.  Goal of Therapy:  Heparin level 0.3-0.7 units/ml Monitor platelets by anticoagulation protocol: Yes   Plan:  11/13:  HL @ 0037 = 0.56, therapeutic X 1 Will continue pt on current rate and recheck HL in 8 hrs on 11/13 @ 0900.   12/13, PharmD Clinical Pharmacist   05/20/2022,1:26 AM

## 2022-05-20 NOTE — Progress Notes (Addendum)
PROGRESS NOTE    [name]  CWC:[id number] DOB: Jan 12, 1946 DOA: 05/19/2022 PCP: Danella Penton, MD      Brief Narrative:   From admission h and p Ann Silva is a 76 y.o. female with medical history significant for nicotine dependence, COPD, coronary artery disease, history of colon cancer status post partial colectomy, history of pulmonary embolism who presents to the ER via EMS for evaluation of shortness of breath and a cough which started 1 day prior to her admission. Patient states that she developed a cough productive of clear phlegm and over the last 24 hours has had shortness of breath both at rest and with exertion.  She has had a fever even though it was undocumented and per EMS she had room air pulse oximetry of 77% and was placed on 6 L of oxygen with improvement in her pulse oximetry to 92%. She has had a sick contact and states that her husband is currently recovering from an upper respiratory tract infection. She denies having any leg swelling, has not had any recent travels and is not on any hormonal therapy. She denies having any chest pain, no nausea, no vomiting, no dizziness, no lightheadedness, no abdominal pain, no changes in her bowel habits, no urinary symptoms, no focal deficits, no blurred vision. Patient had a fever upon arrival to the ER with a Tmax of 100.8, she is tachypneic with respiratory rate of about 30, lactic acid of 2.2 and a normal white cell count Patient's respiratory viral panel is positive for influenza A She had a CT angiogram done which showed moderate pulmonary emboli over the right middle lobe pulmonary artery and suggestion of a few small subsegmental emboli over the lower lobes bilaterally. Positive for acute PE with CT evidence of right heart strain (RV/LV Ratio = 1.07) consistent with at least submassive (intermediate risk) PE.    Assessment & Plan:   Principal Problem:   Acute respiratory failure with hypoxia (HCC) Active  Problems:   Acute pulmonary embolism (HCC)   Influenza due to identified novel influenza A virus with other respiratory manifestations   AKI (acute kidney injury) (HCC)   COPD (chronic obstructive pulmonary disease) (HCC)   Hypercalcemia   Obesity (BMI 30-39.9)   Hyperparathyroidism (HCC)   Chronic pain   Essential hypertension   Tobacco abuse  # Acute pulmonary embolus With CT evidence of right heart strain. Hemodynamically stable. Hypoxic. S/p mechanical thrombectomy today w/ vascular surgery. History PE some years ago treated with coumadin. - will plan to stop heparin, start apixaban. TTE with normal left and right heart function, grade 1 dd.  # Influenza A - Tamiflu  # Acute hypoxic respiratory failure O2 in 70s now on 4 L breathing relatively comfortably. 2/2 PE, flu, and underlying COPD - O2, wean as able  # AKI Resolved  # Left renal incidentaloma - outpt f/u  # COPD Does not appear to be significantly exacerbated - cont home wixela  # GAD - home temazepam, paroxetine  # HTN - home spiro on hold - start home metop  # Overactive bladder - home oxybutynin   DVT prophylaxis: heparin gtt Code Status: full Family Communication: husband updated telephonically 11/13  Level of care: Progressive Status is: Inpatient Remains inpatient appropriate because: severity of illness    Consultants:  vascular  Procedures: Mechanical thrombectomy  Antimicrobials:  perioperative    Subjective: Breathing stable, somewhat improved  Objective: Vitals:   05/20/22 1416 05/20/22 1431 05/20/22 1445 05/20/22 1500  BP:  REPORT   Patient Name:   Ann Silva Date of Exam: 05/20/2022 Medical Rec #:  [id number]     Height:       59.0 in Accession #:    [id number]    Weight:       181.4 lb Date of Birth:  1945/10/14     BSA:          1.770 m Patient Age:    76 years      BP:           111/63 mmHg Patient Gender: F             HR:           78 bpm. Exam Location:  [facility] Procedure: 2D Echo, Color Doppler and Cardiac Doppler Indications:     I26.09 Pulmonary Embolus  History:         Patient has no prior history of Echocardiogram examinations.                  COPD; Risk Factors:Hypertension.  Sonographer:     Humphrey Rolls Referring Phys:  [id number] AGBATA Diagnosing Phys: Alwyn Pea MD  Sonographer Comments: Technically difficult study due to poor echo windows. Image acquisition challenging due to COPD. IMPRESSIONS  1. Left ventricular ejection fraction, by estimation, is 50 to 55%. The left ventricle has low normal function. The left ventricle has no regional wall motion abnormalities. Left ventricular diastolic parameters are consistent with Grade I diastolic dysfunction (impaired relaxation).  2.  Right ventricular systolic function is normal. The right ventricular size is normal.  3. The mitral valve is normal in structure. No evidence of mitral valve regurgitation.  4. The aortic valve is normal in structure. Aortic valve regurgitation is not visualized. FINDINGS  Left Ventricle: Left ventricular ejection fraction, by estimation, is 50 to 55%. The left ventricle has low normal function. The left ventricle has no regional wall motion abnormalities. The left ventricular internal cavity size was normal in size. There is no concentric left ventricular hypertrophy. Left ventricular diastolic parameters are consistent with Grade I diastolic dysfunction (impaired relaxation). Right Ventricle: The right ventricular size is normal. No increase in right ventricular wall thickness. Right ventricular systolic function is normal. Left Atrium: Left atrial size was normal in size. Right Atrium: Right atrial size was normal in size. Pericardium: There is no evidence of pericardial effusion. Mitral Valve: The mitral valve is normal in structure. No evidence of mitral valve regurgitation. Tricuspid Valve: The tricuspid valve is normal in structure. Tricuspid valve regurgitation is trivial. Aortic Valve: The aortic valve is normal in structure. Aortic valve regurgitation is not visualized. Pulmonic Valve: The pulmonic valve was normal in structure. Pulmonic valve regurgitation is not visualized. Aorta: The ascending aorta was not well visualized. IAS/Shunts: No atrial level shunt detected by color flow Doppler.  LEFT VENTRICLE PLAX 2D LVIDd:         3.30 cm Diastology LVIDs:         2.40 cm LV e' medial:    5.87 cm/s LV PW:         1.00 cm LV E/e' medial:  14.8 LV IVS:        1.20 cm LV e' lateral:   7.62 cm/s                        LV E/e' lateral: 11.4  RIGHT VENTRICLE RV Basal diam:  3.20 cm RV Mid diam:  PROGRESS NOTE    [name]  CWC:[id number] DOB: Jan 12, 1946 DOA: 05/19/2022 PCP: Danella Penton, MD      Brief Narrative:   From admission h and p Ann Silva is a 76 y.o. female with medical history significant for nicotine dependence, COPD, coronary artery disease, history of colon cancer status post partial colectomy, history of pulmonary embolism who presents to the ER via EMS for evaluation of shortness of breath and a cough which started 1 day prior to her admission. Patient states that she developed a cough productive of clear phlegm and over the last 24 hours has had shortness of breath both at rest and with exertion.  She has had a fever even though it was undocumented and per EMS she had room air pulse oximetry of 77% and was placed on 6 L of oxygen with improvement in her pulse oximetry to 92%. She has had a sick contact and states that her husband is currently recovering from an upper respiratory tract infection. She denies having any leg swelling, has not had any recent travels and is not on any hormonal therapy. She denies having any chest pain, no nausea, no vomiting, no dizziness, no lightheadedness, no abdominal pain, no changes in her bowel habits, no urinary symptoms, no focal deficits, no blurred vision. Patient had a fever upon arrival to the ER with a Tmax of 100.8, she is tachypneic with respiratory rate of about 30, lactic acid of 2.2 and a normal white cell count Patient's respiratory viral panel is positive for influenza A She had a CT angiogram done which showed moderate pulmonary emboli over the right middle lobe pulmonary artery and suggestion of a few small subsegmental emboli over the lower lobes bilaterally. Positive for acute PE with CT evidence of right heart strain (RV/LV Ratio = 1.07) consistent with at least submassive (intermediate risk) PE.    Assessment & Plan:   Principal Problem:   Acute respiratory failure with hypoxia (HCC) Active  Problems:   Acute pulmonary embolism (HCC)   Influenza due to identified novel influenza A virus with other respiratory manifestations   AKI (acute kidney injury) (HCC)   COPD (chronic obstructive pulmonary disease) (HCC)   Hypercalcemia   Obesity (BMI 30-39.9)   Hyperparathyroidism (HCC)   Chronic pain   Essential hypertension   Tobacco abuse  # Acute pulmonary embolus With CT evidence of right heart strain. Hemodynamically stable. Hypoxic. S/p mechanical thrombectomy today w/ vascular surgery. History PE some years ago treated with coumadin. - will plan to stop heparin, start apixaban. TTE with normal left and right heart function, grade 1 dd.  # Influenza A - Tamiflu  # Acute hypoxic respiratory failure O2 in 70s now on 4 L breathing relatively comfortably. 2/2 PE, flu, and underlying COPD - O2, wean as able  # AKI Resolved  # Left renal incidentaloma - outpt f/u  # COPD Does not appear to be significantly exacerbated - cont home wixela  # GAD - home temazepam, paroxetine  # HTN - home spiro on hold - start home metop  # Overactive bladder - home oxybutynin   DVT prophylaxis: heparin gtt Code Status: full Family Communication: husband updated telephonically 11/13  Level of care: Progressive Status is: Inpatient Remains inpatient appropriate because: severity of illness    Consultants:  vascular  Procedures: Mechanical thrombectomy  Antimicrobials:  perioperative    Subjective: Breathing stable, somewhat improved  Objective: Vitals:   05/20/22 1416 05/20/22 1431 05/20/22 1445 05/20/22 1500  BP:  REPORT   Patient Name:   Ann Silva Date of Exam: 05/20/2022 Medical Rec #:  [id number]     Height:       59.0 in Accession #:    [id number]    Weight:       181.4 lb Date of Birth:  1945/10/14     BSA:          1.770 m Patient Age:    76 years      BP:           111/63 mmHg Patient Gender: F             HR:           78 bpm. Exam Location:  [facility] Procedure: 2D Echo, Color Doppler and Cardiac Doppler Indications:     I26.09 Pulmonary Embolus  History:         Patient has no prior history of Echocardiogram examinations.                  COPD; Risk Factors:Hypertension.  Sonographer:     Humphrey Rolls Referring Phys:  [id number] AGBATA Diagnosing Phys: Alwyn Pea MD  Sonographer Comments: Technically difficult study due to poor echo windows. Image acquisition challenging due to COPD. IMPRESSIONS  1. Left ventricular ejection fraction, by estimation, is 50 to 55%. The left ventricle has low normal function. The left ventricle has no regional wall motion abnormalities. Left ventricular diastolic parameters are consistent with Grade I diastolic dysfunction (impaired relaxation).  2.  Right ventricular systolic function is normal. The right ventricular size is normal.  3. The mitral valve is normal in structure. No evidence of mitral valve regurgitation.  4. The aortic valve is normal in structure. Aortic valve regurgitation is not visualized. FINDINGS  Left Ventricle: Left ventricular ejection fraction, by estimation, is 50 to 55%. The left ventricle has low normal function. The left ventricle has no regional wall motion abnormalities. The left ventricular internal cavity size was normal in size. There is no concentric left ventricular hypertrophy. Left ventricular diastolic parameters are consistent with Grade I diastolic dysfunction (impaired relaxation). Right Ventricle: The right ventricular size is normal. No increase in right ventricular wall thickness. Right ventricular systolic function is normal. Left Atrium: Left atrial size was normal in size. Right Atrium: Right atrial size was normal in size. Pericardium: There is no evidence of pericardial effusion. Mitral Valve: The mitral valve is normal in structure. No evidence of mitral valve regurgitation. Tricuspid Valve: The tricuspid valve is normal in structure. Tricuspid valve regurgitation is trivial. Aortic Valve: The aortic valve is normal in structure. Aortic valve regurgitation is not visualized. Pulmonic Valve: The pulmonic valve was normal in structure. Pulmonic valve regurgitation is not visualized. Aorta: The ascending aorta was not well visualized. IAS/Shunts: No atrial level shunt detected by color flow Doppler.  LEFT VENTRICLE PLAX 2D LVIDd:         3.30 cm Diastology LVIDs:         2.40 cm LV e' medial:    5.87 cm/s LV PW:         1.00 cm LV E/e' medial:  14.8 LV IVS:        1.20 cm LV e' lateral:   7.62 cm/s                        LV E/e' lateral: 11.4  RIGHT VENTRICLE RV Basal diam:  3.20 cm RV Mid diam:  REPORT   Patient Name:   Ann Silva Date of Exam: 05/20/2022 Medical Rec #:  [id number]     Height:       59.0 in Accession #:    [id number]    Weight:       181.4 lb Date of Birth:  1945/10/14     BSA:          1.770 m Patient Age:    76 years      BP:           111/63 mmHg Patient Gender: F             HR:           78 bpm. Exam Location:  [facility] Procedure: 2D Echo, Color Doppler and Cardiac Doppler Indications:     I26.09 Pulmonary Embolus  History:         Patient has no prior history of Echocardiogram examinations.                  COPD; Risk Factors:Hypertension.  Sonographer:     Humphrey Rolls Referring Phys:  [id number] AGBATA Diagnosing Phys: Alwyn Pea MD  Sonographer Comments: Technically difficult study due to poor echo windows. Image acquisition challenging due to COPD. IMPRESSIONS  1. Left ventricular ejection fraction, by estimation, is 50 to 55%. The left ventricle has low normal function. The left ventricle has no regional wall motion abnormalities. Left ventricular diastolic parameters are consistent with Grade I diastolic dysfunction (impaired relaxation).  2.  Right ventricular systolic function is normal. The right ventricular size is normal.  3. The mitral valve is normal in structure. No evidence of mitral valve regurgitation.  4. The aortic valve is normal in structure. Aortic valve regurgitation is not visualized. FINDINGS  Left Ventricle: Left ventricular ejection fraction, by estimation, is 50 to 55%. The left ventricle has low normal function. The left ventricle has no regional wall motion abnormalities. The left ventricular internal cavity size was normal in size. There is no concentric left ventricular hypertrophy. Left ventricular diastolic parameters are consistent with Grade I diastolic dysfunction (impaired relaxation). Right Ventricle: The right ventricular size is normal. No increase in right ventricular wall thickness. Right ventricular systolic function is normal. Left Atrium: Left atrial size was normal in size. Right Atrium: Right atrial size was normal in size. Pericardium: There is no evidence of pericardial effusion. Mitral Valve: The mitral valve is normal in structure. No evidence of mitral valve regurgitation. Tricuspid Valve: The tricuspid valve is normal in structure. Tricuspid valve regurgitation is trivial. Aortic Valve: The aortic valve is normal in structure. Aortic valve regurgitation is not visualized. Pulmonic Valve: The pulmonic valve was normal in structure. Pulmonic valve regurgitation is not visualized. Aorta: The ascending aorta was not well visualized. IAS/Shunts: No atrial level shunt detected by color flow Doppler.  LEFT VENTRICLE PLAX 2D LVIDd:         3.30 cm Diastology LVIDs:         2.40 cm LV e' medial:    5.87 cm/s LV PW:         1.00 cm LV E/e' medial:  14.8 LV IVS:        1.20 cm LV e' lateral:   7.62 cm/s                        LV E/e' lateral: 11.4  RIGHT VENTRICLE RV Basal diam:  3.20 cm RV Mid diam:  REPORT   Patient Name:   Ann Silva Date of Exam: 05/20/2022 Medical Rec #:  [id number]     Height:       59.0 in Accession #:    [id number]    Weight:       181.4 lb Date of Birth:  1945/10/14     BSA:          1.770 m Patient Age:    76 years      BP:           111/63 mmHg Patient Gender: F             HR:           78 bpm. Exam Location:  [facility] Procedure: 2D Echo, Color Doppler and Cardiac Doppler Indications:     I26.09 Pulmonary Embolus  History:         Patient has no prior history of Echocardiogram examinations.                  COPD; Risk Factors:Hypertension.  Sonographer:     Humphrey Rolls Referring Phys:  [id number] AGBATA Diagnosing Phys: Alwyn Pea MD  Sonographer Comments: Technically difficult study due to poor echo windows. Image acquisition challenging due to COPD. IMPRESSIONS  1. Left ventricular ejection fraction, by estimation, is 50 to 55%. The left ventricle has low normal function. The left ventricle has no regional wall motion abnormalities. Left ventricular diastolic parameters are consistent with Grade I diastolic dysfunction (impaired relaxation).  2.  Right ventricular systolic function is normal. The right ventricular size is normal.  3. The mitral valve is normal in structure. No evidence of mitral valve regurgitation.  4. The aortic valve is normal in structure. Aortic valve regurgitation is not visualized. FINDINGS  Left Ventricle: Left ventricular ejection fraction, by estimation, is 50 to 55%. The left ventricle has low normal function. The left ventricle has no regional wall motion abnormalities. The left ventricular internal cavity size was normal in size. There is no concentric left ventricular hypertrophy. Left ventricular diastolic parameters are consistent with Grade I diastolic dysfunction (impaired relaxation). Right Ventricle: The right ventricular size is normal. No increase in right ventricular wall thickness. Right ventricular systolic function is normal. Left Atrium: Left atrial size was normal in size. Right Atrium: Right atrial size was normal in size. Pericardium: There is no evidence of pericardial effusion. Mitral Valve: The mitral valve is normal in structure. No evidence of mitral valve regurgitation. Tricuspid Valve: The tricuspid valve is normal in structure. Tricuspid valve regurgitation is trivial. Aortic Valve: The aortic valve is normal in structure. Aortic valve regurgitation is not visualized. Pulmonic Valve: The pulmonic valve was normal in structure. Pulmonic valve regurgitation is not visualized. Aorta: The ascending aorta was not well visualized. IAS/Shunts: No atrial level shunt detected by color flow Doppler.  LEFT VENTRICLE PLAX 2D LVIDd:         3.30 cm Diastology LVIDs:         2.40 cm LV e' medial:    5.87 cm/s LV PW:         1.00 cm LV E/e' medial:  14.8 LV IVS:        1.20 cm LV e' lateral:   7.62 cm/s                        LV E/e' lateral: 11.4  RIGHT VENTRICLE RV Basal diam:  3.20 cm RV Mid diam:

## 2022-05-20 NOTE — Consult Note (Signed)
ANTICOAGULATION CONSULT NOTE - Initial Consult  Pharmacy Consult for heparin infusion Indication: pulmonary embolus  Allergies  Allergen Reactions   Sulfa Antibiotics Shortness Of Breath    Patient Measurements: Height: 4\' 11"  (149.9 cm) Weight: 82.3 kg (181 lb 7 oz) IBW/kg (Calculated) : 43.2 Heparin Dosing Weight: 60 kg   Vital Signs: Temp: 99.8 F (37.7 C) (11/13 0725) Temp Source: Oral (11/13 0725) BP: 130/73 (11/13 0725) Pulse Rate: 87 (11/13 0725)  Labs: Recent Labs    05/19/22 1348 05/19/22 1549 05/19/22 1623 05/20/22 0037 05/20/22 0905  HGB 15.2*  --   --  14.4  --   HCT 45.3  --   --  43.3  --   PLT 201  --   --  159  --   APTT  --  30  --   --   --   LABPROT  --  13.8  --   --   --   INR  --  1.1  --   --   --   HEPARINUNFRC  --   --   --  0.56 0.53  CREATININE 1.04*  --   --  0.90  --   TROPONINIHS 4  --  5  --   --      Estimated Creatinine Clearance: 48.6 mL/min (by C-G formula based on SCr of 0.9 mg/dL).   Medical History: Past Medical History:  Diagnosis Date   COPD (chronic obstructive pulmonary disease) (HCC)    Hypertension     Medications:  No prior anticoagulation noted   Assessment: 76 y.o. female with past medical history of hypertension, COPD, CAD, PE no longer on anticoagulation, and colon cancer who presents to the ED complaining of shortness of breath. CT CT showing moderate PE with evidence of right heart strain. Pharmacy consulted for initiation and management of heparin infusion.  Date Time aPTT/HL Rate/Comment 11/13  0037 0.56,   therapeutic x1; 1000 un/hr 11/13  0905 0.53  Therapeutic x2; 1000 un/hr      Baseline Labs: aPTT - 30s; INR - 1.1 Hgb - WNL (14.4); Plts - WNL (159)  Goal of Therapy:  Heparin level 0.3-0.7 units/ml Monitor platelets by anticoagulation protocol: Yes   Plan:  Consecutively therapeutic at current rate. Switch to daily HL with AM labs. Pt is expected to go to cath lab today will need to f/u  subsequent [facility] plan after procedure. Continue Heparin gtt at 1000 un/hr and recheck HL in AM on 11/14 @ 0500.  CTM CBC daily while on heparin gtt.  12/14, PharmD Clinical Pharmacist   05/20/2022,10:28 AM

## 2022-05-20 NOTE — Plan of Care (Signed)

## 2022-05-20 NOTE — Progress Notes (Signed)
CROSS COVER NOTE  NAME: Ann Silva MRN: [id number] DOB : [birth year] ATTENDING PHYSICIAN: Kathrynn Running, MD    Date of Service   05/20/2022   HPI/Events of Note   Medication request received for home Temazepam.  Interventions   Assessment/Plan:  Home Temazepam x1     This document was prepared using Dragon voice recognition software and may include unintentional dictation errors.  Bishop Limbo DNP, MBA, FNP-BC Nurse Practitioner [facility] Pager [phone]

## 2022-05-20 NOTE — Interval H&P Note (Signed)
History and Physical Interval Note:  05/20/2022 11:08 AM  Ann Silva  has presented today for surgery, with the diagnosis of Pulmonary Emboli.  The various methods of treatment have been discussed with the patient and family. After consideration of risks, benefits and other options for treatment, the patient has consented to  Procedure(s) with comments: PULMONARY THROMBECTOMY (Right) - Bilateral subsegmental as a surgical intervention.  The patient's history has been reviewed, patient examined, no change in status, stable for surgery.  I have reviewed the patient's chart and labs.  Questions were answered to the patient's satisfaction.     Festus Barren

## 2022-05-21 ENCOUNTER — Encounter: Payer: Self-pay | Admitting: Vascular Surgery

## 2022-05-21 ENCOUNTER — Telehealth (HOSPITAL_COMMUNITY): Payer: Self-pay | Admitting: Pharmacy Technician

## 2022-05-21 ENCOUNTER — Other Ambulatory Visit (HOSPITAL_COMMUNITY): Payer: Self-pay

## 2022-05-21 DIAGNOSIS — J9601 Acute respiratory failure with hypoxia: Secondary | ICD-10-CM | POA: Diagnosis not present

## 2022-05-21 LAB — CBC
HCT: 40.1 % (ref 36.0–46.0)
Hemoglobin: 13 g/dL (ref 12.0–15.0)
MCH: 30.4 pg (ref 26.0–34.0)
MCHC: 32.4 g/dL (ref 30.0–36.0)
MCV: 93.7 fL (ref 80.0–100.0)
Platelets: 142 10*3/uL — ABNORMAL LOW (ref 150–400)
RBC: 4.28 MIL/uL (ref 3.87–5.11)
RDW: 15.4 % (ref 11.5–15.5)
WBC: 4.2 10*3/uL (ref 4.0–10.5)
nRBC: 0 % (ref 0.0–0.2)

## 2022-05-21 LAB — HEPARIN LEVEL (UNFRACTIONATED): Heparin Unfractionated: 0.38 IU/mL (ref 0.30–0.70)

## 2022-05-21 MED ORDER — APIXABAN 5 MG PO TABS: 5.0000 mg | ORAL_TABLET | Freq: Two times a day (BID) | ORAL | Status: DC

## 2022-05-21 MED ORDER — PREDNISONE 20 MG PO TABS
40.0000 mg | ORAL_TABLET | Freq: Every day | ORAL | Status: DC
  Administered 2022-05-21 – 2022-05-23 (×3): 40 mg via ORAL
  Filled 2022-05-21 (×3): qty 2

## 2022-05-21 MED ORDER — APIXABAN 5 MG PO TABS
10.0000 mg | ORAL_TABLET | Freq: Two times a day (BID) | ORAL | Status: DC
  Administered 2022-05-21 – 2022-05-23 (×5): 10 mg via ORAL
  Filled 2022-05-21 (×5): qty 2

## 2022-05-21 NOTE — Telephone Encounter (Signed)
Pharmacy Patient Advocate Encounter  Insurance verification completed.    The patient is insured through [employer] Part D   The patient is currently admitted and ran test claims for the following: Eliquis .  Copays and coinsurance results were relayed to Inpatient clinical team.      

## 2022-05-21 NOTE — Consult Note (Addendum)
ANTICOAGULATION CONSULT NOTE - Initial Consult  Pharmacy Consult for heparin infusion Indication: pulmonary embolus  Allergies  Allergen Reactions   Sulfa Antibiotics Shortness Of Breath    Patient Measurements: Height: 4\' 11"  (149.9 cm) Weight: 82.3 kg (181 lb 7 oz) IBW/kg (Calculated) : 43.2 Heparin Dosing Weight: 60 kg   Vital Signs: Temp: 99.4 F (37.4 C) (11/14 0355) Temp Source: Oral (11/14 0355) BP: 119/61 (11/14 0355) Pulse Rate: 65 (11/14 0355)  Labs: Recent Labs    05/19/22 1348 05/19/22 1549 05/19/22 1623 05/20/22 0037 05/20/22 0905  HGB 15.2*  --   --  14.4  --   HCT 45.3  --   --  43.3  --   PLT 201  --   --  159  --   APTT  --  30  --   --   --   LABPROT  --  13.8  --   --   --   INR  --  1.1  --   --   --   HEPARINUNFRC  --   --   --  0.56 0.53  CREATININE 1.04*  --   --  0.90  --   TROPONINIHS 4  --  5  --   --      Estimated Creatinine Clearance: 48.6 mL/min (by C-G formula based on SCr of 0.9 mg/dL).   Medical History: Past Medical History:  Diagnosis Date   COPD (chronic obstructive pulmonary disease) (HCC)    Hypertension     Medications:  Scheduled:   heparin  4,000 Units Intravenous Once   metoprolol succinate  50 mg Oral Daily   mometasone-formoterol  2 puff Inhalation BID   montelukast  10 mg Oral Daily   oseltamivir  30 mg Oral BID   oxybutynin  5 mg Oral BID   pantoprazole  40 mg Oral Daily   PARoxetine  40 mg Oral Daily   Infusions:   heparin 1,000 Units/hr (05/20/22 2021)   PRN: acetaminophen **OR** acetaminophen, guaiFENesin, HYDROmorphone (DILAUDID) injection, ipratropium-albuterol, ondansetron **OR** ondansetron (ZOFRAN) IV, ondansetron (ZOFRAN) IV  Assessment: Ann Silva is a 76 y.o. female presenting with SOB. PMH significant for HTN, COPD, CAD, PE not on AC, and colon cancer. Patient was not on [facility] PTA per chart review. CT showing moderate PE with evidence of right heart strain. Patient underwent mechanical  thrombectomy 11/13. Pharmacy has been consulted to initiate and manage heparin infusion.   Baseline Labs: aPTT 30, PT 13.8, INR 1.1, Hgb 15.2, Hct 45.3, Plt 201   Goal of Therapy:  Heparin level 0.3-0.7 units/ml Monitor platelets by anticoagulation protocol: Yes   Date Time HL Rate/Comment 11/13  0037 0.56  1000/therapeutic x1  11/13  0905 0.53 1000/therapeutic x2  11/14 0614 0.38 1000/therapeutic x3    Addendum: Will transition to Apixaban 10 mg BID x7 days then 5 mg BID thereafter  Plan:  Discontinue heparin infusion Start Apixaban 10 mg BID today, then transition to Apixaban 5 mg BID starting on 05/28/22  05/30/22, PharmD PGY1 Pharmacy Resident 05/21/2022 7:12 AM

## 2022-05-21 NOTE — Progress Notes (Signed)
CATHETERIZATION  Result Date: 05/20/2022 See surgical note for result.  ECHOCARDIOGRAM COMPLETE  Result Date: 05/20/2022    ECHOCARDIOGRAM REPORT   Patient Name:   Ann Silva Date of Exam: 05/20/2022 Medical Rec #:  [id number]     Height:       59.0 in Accession #:    [id number]    Weight:       181.4 lb Date of Birth:  11-29-1945     BSA:          1.770 m Patient Age:    76 years      BP:           111/63 mmHg Patient Gender: F             HR:           78 bpm. Exam Location:  [facility] Procedure: 2D Echo, Color Doppler and Cardiac Doppler Indications:     I26.09 Pulmonary Embolus  History:         Patient has no prior history of Echocardiogram examinations.                  COPD; Risk Factors:Hypertension.  Sonographer:     Humphrey Rolls Referring Phys:  [id number] GYIRSWNI AGBATA Diagnosing Phys: Alwyn Pea MD  Sonographer Comments: Technically difficult study due to poor echo windows. Image acquisition challenging due to COPD. IMPRESSIONS  1. Left ventricular ejection fraction, by estimation, is 50 to 55%. The left ventricle has low normal function. The left ventricle has no regional wall motion abnormalities. Left ventricular diastolic parameters  are consistent with Grade I diastolic dysfunction (impaired relaxation).  2. Right ventricular systolic function is normal. The right ventricular size is normal.  3. The mitral valve is normal in structure. No evidence of mitral valve regurgitation.  4. The aortic valve is normal in structure. Aortic valve regurgitation is not visualized. FINDINGS  Left Ventricle: Left ventricular ejection fraction, by estimation, is 50 to 55%. The left ventricle has low normal function. The left ventricle has no regional wall motion abnormalities. The left ventricular internal cavity size was normal in size. There is no concentric left ventricular hypertrophy. Left ventricular diastolic parameters are consistent with Grade I diastolic dysfunction (impaired relaxation). Right Ventricle: The right ventricular size is normal. No increase in right ventricular wall thickness. Right ventricular systolic function is normal. Left Atrium: Left atrial size was normal in size. Right Atrium: Right atrial size was normal in size. Pericardium: There is no evidence of pericardial effusion. Mitral Valve: The mitral valve is normal in structure. No evidence of mitral valve regurgitation. Tricuspid Valve: The tricuspid valve is normal in structure. Tricuspid valve regurgitation is trivial. Aortic Valve: The aortic valve is normal in structure. Aortic valve regurgitation is not visualized. Pulmonic Valve: The pulmonic valve was normal in structure. Pulmonic valve regurgitation is not visualized. Aorta: The ascending aorta was not well visualized. IAS/Shunts: No atrial level shunt detected by color flow Doppler.  LEFT VENTRICLE PLAX 2D LVIDd:         3.30 cm Diastology LVIDs:         2.40 cm LV e' medial:    5.87 cm/s LV PW:         1.00 cm LV E/e' medial:  14.8 LV IVS:        1.20 cm LV e' lateral:   7.62 cm/s  CATHETERIZATION  Result Date: 05/20/2022 See surgical note for result.  ECHOCARDIOGRAM COMPLETE  Result Date: 05/20/2022    ECHOCARDIOGRAM REPORT   Patient Name:   Ann Silva Date of Exam: 05/20/2022 Medical Rec #:  [id number]     Height:       59.0 in Accession #:    [id number]    Weight:       181.4 lb Date of Birth:  11-29-1945     BSA:          1.770 m Patient Age:    76 years      BP:           111/63 mmHg Patient Gender: F             HR:           78 bpm. Exam Location:  [facility] Procedure: 2D Echo, Color Doppler and Cardiac Doppler Indications:     I26.09 Pulmonary Embolus  History:         Patient has no prior history of Echocardiogram examinations.                  COPD; Risk Factors:Hypertension.  Sonographer:     Humphrey Rolls Referring Phys:  [id number] GYIRSWNI AGBATA Diagnosing Phys: Alwyn Pea MD  Sonographer Comments: Technically difficult study due to poor echo windows. Image acquisition challenging due to COPD. IMPRESSIONS  1. Left ventricular ejection fraction, by estimation, is 50 to 55%. The left ventricle has low normal function. The left ventricle has no regional wall motion abnormalities. Left ventricular diastolic parameters  are consistent with Grade I diastolic dysfunction (impaired relaxation).  2. Right ventricular systolic function is normal. The right ventricular size is normal.  3. The mitral valve is normal in structure. No evidence of mitral valve regurgitation.  4. The aortic valve is normal in structure. Aortic valve regurgitation is not visualized. FINDINGS  Left Ventricle: Left ventricular ejection fraction, by estimation, is 50 to 55%. The left ventricle has low normal function. The left ventricle has no regional wall motion abnormalities. The left ventricular internal cavity size was normal in size. There is no concentric left ventricular hypertrophy. Left ventricular diastolic parameters are consistent with Grade I diastolic dysfunction (impaired relaxation). Right Ventricle: The right ventricular size is normal. No increase in right ventricular wall thickness. Right ventricular systolic function is normal. Left Atrium: Left atrial size was normal in size. Right Atrium: Right atrial size was normal in size. Pericardium: There is no evidence of pericardial effusion. Mitral Valve: The mitral valve is normal in structure. No evidence of mitral valve regurgitation. Tricuspid Valve: The tricuspid valve is normal in structure. Tricuspid valve regurgitation is trivial. Aortic Valve: The aortic valve is normal in structure. Aortic valve regurgitation is not visualized. Pulmonic Valve: The pulmonic valve was normal in structure. Pulmonic valve regurgitation is not visualized. Aorta: The ascending aorta was not well visualized. IAS/Shunts: No atrial level shunt detected by color flow Doppler.  LEFT VENTRICLE PLAX 2D LVIDd:         3.30 cm Diastology LVIDs:         2.40 cm LV e' medial:    5.87 cm/s LV PW:         1.00 cm LV E/e' medial:  14.8 LV IVS:        1.20 cm LV e' lateral:   7.62 cm/s  CATHETERIZATION  Result Date: 05/20/2022 See surgical note for result.  ECHOCARDIOGRAM COMPLETE  Result Date: 05/20/2022    ECHOCARDIOGRAM REPORT   Patient Name:   Ann Silva Date of Exam: 05/20/2022 Medical Rec #:  [id number]     Height:       59.0 in Accession #:    [id number]    Weight:       181.4 lb Date of Birth:  11-29-1945     BSA:          1.770 m Patient Age:    76 years      BP:           111/63 mmHg Patient Gender: F             HR:           78 bpm. Exam Location:  [facility] Procedure: 2D Echo, Color Doppler and Cardiac Doppler Indications:     I26.09 Pulmonary Embolus  History:         Patient has no prior history of Echocardiogram examinations.                  COPD; Risk Factors:Hypertension.  Sonographer:     Humphrey Rolls Referring Phys:  [id number] GYIRSWNI AGBATA Diagnosing Phys: Alwyn Pea MD  Sonographer Comments: Technically difficult study due to poor echo windows. Image acquisition challenging due to COPD. IMPRESSIONS  1. Left ventricular ejection fraction, by estimation, is 50 to 55%. The left ventricle has low normal function. The left ventricle has no regional wall motion abnormalities. Left ventricular diastolic parameters  are consistent with Grade I diastolic dysfunction (impaired relaxation).  2. Right ventricular systolic function is normal. The right ventricular size is normal.  3. The mitral valve is normal in structure. No evidence of mitral valve regurgitation.  4. The aortic valve is normal in structure. Aortic valve regurgitation is not visualized. FINDINGS  Left Ventricle: Left ventricular ejection fraction, by estimation, is 50 to 55%. The left ventricle has low normal function. The left ventricle has no regional wall motion abnormalities. The left ventricular internal cavity size was normal in size. There is no concentric left ventricular hypertrophy. Left ventricular diastolic parameters are consistent with Grade I diastolic dysfunction (impaired relaxation). Right Ventricle: The right ventricular size is normal. No increase in right ventricular wall thickness. Right ventricular systolic function is normal. Left Atrium: Left atrial size was normal in size. Right Atrium: Right atrial size was normal in size. Pericardium: There is no evidence of pericardial effusion. Mitral Valve: The mitral valve is normal in structure. No evidence of mitral valve regurgitation. Tricuspid Valve: The tricuspid valve is normal in structure. Tricuspid valve regurgitation is trivial. Aortic Valve: The aortic valve is normal in structure. Aortic valve regurgitation is not visualized. Pulmonic Valve: The pulmonic valve was normal in structure. Pulmonic valve regurgitation is not visualized. Aorta: The ascending aorta was not well visualized. IAS/Shunts: No atrial level shunt detected by color flow Doppler.  LEFT VENTRICLE PLAX 2D LVIDd:         3.30 cm Diastology LVIDs:         2.40 cm LV e' medial:    5.87 cm/s LV PW:         1.00 cm LV E/e' medial:  14.8 LV IVS:        1.20 cm LV e' lateral:   7.62 cm/s  PROGRESS NOTE    Ann Silva  NGE:[id number] DOB: 02/13/1946 DOA: 05/19/2022 PCP: Danella PentonMiller, Mark F, MD      Brief Narrative:   From admission h and p Ann Silva is a 76 y.o. female with medical history significant for nicotine dependence, COPD, coronary artery disease, history of colon cancer status post partial colectomy, history of pulmonary embolism who presents to the ER via EMS for evaluation of shortness of breath and a cough which started 1 day prior to her admission. Patient states that she developed a cough productive of clear phlegm and over the last 24 hours has had shortness of breath both at rest and with exertion.  She has had a fever even though it was undocumented and per EMS she had room air pulse oximetry of 77% and was placed on 6 L of oxygen with improvement in her pulse oximetry to 92%. She has had a sick contact and states that her husband is currently recovering from an upper respiratory tract infection. She denies having any leg swelling, has not had any recent travels and is not on any hormonal therapy. She denies having any chest pain, no nausea, no vomiting, no dizziness, no lightheadedness, no abdominal pain, no changes in her bowel habits, no urinary symptoms, no focal deficits, no blurred vision. Patient had a fever upon arrival to the ER with a Tmax of 100.8, she is tachypneic with respiratory rate of about 30, lactic acid of 2.2 and a normal white cell count Patient's respiratory viral panel is positive for influenza A She had a CT angiogram done which showed moderate pulmonary emboli over the right middle lobe pulmonary artery and suggestion of a few small subsegmental emboli over the lower lobes bilaterally. Positive for acute PE with CT evidence of right heart strain (RV/LV Ratio = 1.07) consistent with at least submassive (intermediate risk) PE.    Assessment & Plan:   Principal Problem:   Acute respiratory failure with hypoxia (HCC) Active  Problems:   Acute pulmonary embolism (HCC)   Influenza due to identified novel influenza A virus with other respiratory manifestations   AKI (acute kidney injury) (HCC)   COPD (chronic obstructive pulmonary disease) (HCC)   Hypercalcemia   Obesity (BMI 30-39.9)   Hyperparathyroidism (HCC)   Chronic pain   Essential hypertension   Tobacco abuse  # Acute pulmonary embolus With CT evidence of right heart strain. Hemodynamically stable. Hypoxic. S/p mechanical thrombectomy 11/13 w/ vascular surgery. History PE some years ago treated with coumadin. TTE with normal left and right heart function, grade 1 dd. - now off heparin, apixaban started 11/14   # Influenza A -  continue Tamiflu  # Acute hypoxic respiratory failure O2 in 70s initially on 4 L now weaned to 2 L breathing relatively comfortably. 2/2 PE, flu, and underlying COPD - O2, wean as able  # AKI Resolved  # Left renal incidentaloma - outpt f/u  # COPD - cont home wixela - will add prednisone given cough  # GAD - home temazepam, paroxetine  # HTN - home spiro on hold - cont home metop  # Overactive bladder - home oxybutynin   # Tobacco abuse  DVT prophylaxis: apixaban Code Status: full Family Communication: husband updated telephonically 11/14  Level of care: Progressive Status is: Inpatient Remains inpatient appropriate because: severity of illness    Consultants:  vascular  Procedures: Mechanical thrombectomy  Antimicrobials:  perioperative    Subjective: Breathing stable, somewhat improved  Objective: Vitals:   05/21/22  CATHETERIZATION  Result Date: 05/20/2022 See surgical note for result.  ECHOCARDIOGRAM COMPLETE  Result Date: 05/20/2022    ECHOCARDIOGRAM REPORT   Patient Name:   Ann Silva Date of Exam: 05/20/2022 Medical Rec #:  [id number]     Height:       59.0 in Accession #:    [id number]    Weight:       181.4 lb Date of Birth:  11-29-1945     BSA:          1.770 m Patient Age:    76 years      BP:           111/63 mmHg Patient Gender: F             HR:           78 bpm. Exam Location:  [facility] Procedure: 2D Echo, Color Doppler and Cardiac Doppler Indications:     I26.09 Pulmonary Embolus  History:         Patient has no prior history of Echocardiogram examinations.                  COPD; Risk Factors:Hypertension.  Sonographer:     Humphrey Rolls Referring Phys:  [id number] GYIRSWNI AGBATA Diagnosing Phys: Alwyn Pea MD  Sonographer Comments: Technically difficult study due to poor echo windows. Image acquisition challenging due to COPD. IMPRESSIONS  1. Left ventricular ejection fraction, by estimation, is 50 to 55%. The left ventricle has low normal function. The left ventricle has no regional wall motion abnormalities. Left ventricular diastolic parameters  are consistent with Grade I diastolic dysfunction (impaired relaxation).  2. Right ventricular systolic function is normal. The right ventricular size is normal.  3. The mitral valve is normal in structure. No evidence of mitral valve regurgitation.  4. The aortic valve is normal in structure. Aortic valve regurgitation is not visualized. FINDINGS  Left Ventricle: Left ventricular ejection fraction, by estimation, is 50 to 55%. The left ventricle has low normal function. The left ventricle has no regional wall motion abnormalities. The left ventricular internal cavity size was normal in size. There is no concentric left ventricular hypertrophy. Left ventricular diastolic parameters are consistent with Grade I diastolic dysfunction (impaired relaxation). Right Ventricle: The right ventricular size is normal. No increase in right ventricular wall thickness. Right ventricular systolic function is normal. Left Atrium: Left atrial size was normal in size. Right Atrium: Right atrial size was normal in size. Pericardium: There is no evidence of pericardial effusion. Mitral Valve: The mitral valve is normal in structure. No evidence of mitral valve regurgitation. Tricuspid Valve: The tricuspid valve is normal in structure. Tricuspid valve regurgitation is trivial. Aortic Valve: The aortic valve is normal in structure. Aortic valve regurgitation is not visualized. Pulmonic Valve: The pulmonic valve was normal in structure. Pulmonic valve regurgitation is not visualized. Aorta: The ascending aorta was not well visualized. IAS/Shunts: No atrial level shunt detected by color flow Doppler.  LEFT VENTRICLE PLAX 2D LVIDd:         3.30 cm Diastology LVIDs:         2.40 cm LV e' medial:    5.87 cm/s LV PW:         1.00 cm LV E/e' medial:  14.8 LV IVS:        1.20 cm LV e' lateral:   7.62 cm/s  PROGRESS NOTE    Ann Silva  [id number] DOB: 02/13/1946 DOA: 05/19/2022 PCP: Danella PentonMiller, Mark F, MD      Brief Narrative:   From admission h and p Ann Silva is a 76 y.o. female with medical history significant for nicotine dependence, COPD, coronary artery disease, history of colon cancer status post partial colectomy, history of pulmonary embolism who presents to the ER via EMS for evaluation of shortness of breath and a cough which started 1 day prior to her admission. Patient states that she developed a cough productive of clear phlegm and over the last 24 hours has had shortness of breath both at rest and with exertion.  She has had a fever even though it was undocumented and per EMS she had room air pulse oximetry of 77% and was placed on 6 L of oxygen with improvement in her pulse oximetry to 92%. She has had a sick contact and states that her husband is currently recovering from an upper respiratory tract infection. She denies having any leg swelling, has not had any recent travels and is not on any hormonal therapy. She denies having any chest pain, no nausea, no vomiting, no dizziness, no lightheadedness, no abdominal pain, no changes in her bowel habits, no urinary symptoms, no focal deficits, no blurred vision. Patient had a fever upon arrival to the ER with a Tmax of 100.8, she is tachypneic with respiratory rate of about 30, lactic acid of 2.2 and a normal white cell count Patient's respiratory viral panel is positive for influenza A She had a CT angiogram done which showed moderate pulmonary emboli over the right middle lobe pulmonary artery and suggestion of a few small subsegmental emboli over the lower lobes bilaterally. Positive for acute PE with CT evidence of right heart strain (RV/LV Ratio = 1.07) consistent with at least submassive (intermediate risk) PE.    Assessment & Plan:   Principal Problem:   Acute respiratory failure with hypoxia (HCC) Active  Problems:   Acute pulmonary embolism (HCC)   Influenza due to identified novel influenza A virus with other respiratory manifestations   AKI (acute kidney injury) (HCC)   COPD (chronic obstructive pulmonary disease) (HCC)   Hypercalcemia   Obesity (BMI 30-39.9)   Hyperparathyroidism (HCC)   Chronic pain   Essential hypertension   Tobacco abuse  # Acute pulmonary embolus With CT evidence of right heart strain. Hemodynamically stable. Hypoxic. S/p mechanical thrombectomy 11/13 w/ vascular surgery. History PE some years ago treated with coumadin. TTE with normal left and right heart function, grade 1 dd. - now off heparin, apixaban started 11/14   # Influenza A -  continue Tamiflu  # Acute hypoxic respiratory failure O2 in 70s initially on 4 L now weaned to 2 L breathing relatively comfortably. 2/2 PE, flu, and underlying COPD - O2, wean as able  # AKI Resolved  # Left renal incidentaloma - outpt f/u  # COPD - cont home wixela - will add prednisone given cough  # GAD - home temazepam, paroxetine  # HTN - home spiro on hold - cont home metop  # Overactive bladder - home oxybutynin   # Tobacco abuse  DVT prophylaxis: apixaban Code Status: full Family Communication: husband updated telephonically 11/14  Level of care: Progressive Status is: Inpatient Remains inpatient appropriate because: severity of illness    Consultants:  vascular  Procedures: Mechanical thrombectomy  Antimicrobials:  perioperative    Subjective: Breathing stable, somewhat improved  Objective: Vitals:   05/21/22

## 2022-05-21 NOTE — Progress Notes (Signed)
[facility]  Daily Progress Note   Subjective  -   Ann Silva is a 75 year old female with a medical history significant for COPD coronary artery disease, history of pulmonary embolism.  She presented to the emergency room via EMS day ago for evaluation of shortness of breath and cough.  Stated that she had developed a cough productive of clear phlegm over the last 24 hours and shortness of breath both at rest and exertion.  Upon entry to the emergency room her pulse oximetry was 77% she was placed on 6 L of oxygen which improved her pulse oximetry to 92%.  Today she is postop day 1 mechanical thrombectomy of a pulmonary embolism.  She appears to be doing very well and is now on 2 L of nasal cannula oxygen.  She states that she feels as though she is breathing a little better in relation to when she first came into the hospital.  She acknowledges that her having influenza A is also complicating her breathing status.  Overall she states that she is feeling better.  Objective Vitals:   05/21/22 0438 05/21/22 0801 05/21/22 1140 05/21/22 1252  BP:  128/63 104/66   Pulse:  63 67   Resp: 20 18 19    Temp:  99 F (37.2 C) 99.2 F (37.3 C)   TempSrc:  Oral Oral   SpO2:  92% 94% 93%  Weight:      Height:        Intake/Output Summary (Last 24 hours) at 05/21/2022 1310 Last data filed at 05/21/2022 1142 Gross per 24 hour  Intake 790.5 ml  Output 1950 ml  Net -1159.5 ml    PULM  Rhonchi throughout all fields. Patient remains on 2 liters Nasal Canula O2. CV  RRR VASC  Right groin insertion site is without hematoma or seroma.   Laboratory CBC    Component Value Date/Time   WBC 4.2 05/21/2022 0614   HGB 13.0 05/21/2022 0614   HCT 40.1 05/21/2022 0614   PLT 142 (L) 05/21/2022 0614    BMET    Component Value Date/Time   NA 136 05/20/2022 0037   K 3.5 05/20/2022 0037   CL 103 05/20/2022 0037   CO2 24 05/20/2022 0037   GLUCOSE 128 (H) 05/20/2022 0037    BUN 23 05/20/2022 0037   CREATININE 0.90 05/20/2022 0037   CALCIUM 9.7 05/20/2022 0037   GFRNONAA >60 05/20/2022 0037    Assessment/Planning: POD #1 s/p Symptomatic bilateral pulmonary emboli with mechanical thrombectomy  Acute respiratory failure/Acute Pulmonary Embolism. Monitor patients SPO2. Provide supplimental oxygen as needed now that clot burden is reduced. Pulmonary Thrombectomy: Monitor right groin insertion site for hematoma and seroma. None noted today. Patient will need to be anticoagulated for 1 Year.  Influenza A: Monitored and treated by Primary Team.   05/22/2022  05/21/2022, 1:10 PM

## 2022-05-21 NOTE — TOC Benefit Eligibility Note (Signed)
Patient Product/process development scientist completed.    The patient is currently admitted and upon discharge could be taking Eliquis 5 mg.  The current 30 day co-pay is $47.00.   The patient is insured through [employer] Medicare Part D     Roland Earl, CPhT Pharmacy Patient Advocate Specialist [facility] Patient Advocate Team Direct Number: [phone]  Fax: [phone]

## 2022-05-22 DIAGNOSIS — I2699 Other pulmonary embolism without acute cor pulmonale: Secondary | ICD-10-CM

## 2022-05-22 DIAGNOSIS — B349 Viral infection, unspecified: Secondary | ICD-10-CM

## 2022-05-22 DIAGNOSIS — J9601 Acute respiratory failure with hypoxia: Secondary | ICD-10-CM | POA: Diagnosis not present

## 2022-05-22 LAB — BASIC METABOLIC PANEL
Anion gap: 13 (ref 5–15)
BUN: 18 mg/dL (ref 8–23)
CO2: 19 mmol/L — ABNORMAL LOW (ref 22–32)
Calcium: 10.1 mg/dL (ref 8.9–10.3)
Chloride: 106 mmol/L (ref 98–111)
Creatinine, Ser: 0.71 mg/dL (ref 0.44–1.00)
GFR, Estimated: >60 mL/min (ref >60)
Glucose, Bld: 200 mg/dL — ABNORMAL HIGH (ref 70–99)
Potassium: 4 mmol/L (ref 3.5–5.1)
Sodium: 138 mmol/L (ref 135–145)

## 2022-05-22 LAB — CBC
HCT: 40.1 % (ref 36.0–46.0)
Hemoglobin: 13.3 g/dL (ref 12.0–15.0)
MCH: 30.7 pg (ref 26.0–34.0)
MCHC: 33.2 g/dL (ref 30.0–36.0)
MCV: 92.6 fL (ref 80.0–100.0)
Platelets: 150 10*3/uL (ref 150–400)
RBC: 4.33 MIL/uL (ref 3.87–5.11)
RDW: 14.7 % (ref 11.5–15.5)
WBC: 4.4 10*3/uL (ref 4.0–10.5)
nRBC: 0 % (ref 0.0–0.2)

## 2022-05-22 LAB — MAGNESIUM: Magnesium: 2.2 mg/dL (ref 1.7–2.4)

## 2022-05-22 LAB — TSH: TSH: 0.354 u[IU]/mL (ref 0.350–4.500)

## 2022-05-22 NOTE — TOC Initial Note (Signed)
Transition of Care [facility]) - Initial/Assessment Note    Patient Details  Name: Alawna Freiberger MRN: [id number] Date of Birth: 11/04/1945  [facility]) CM/SW Contact:    Margarito Liner, LCSW Phone Number: 05/22/2022, 11:52 AM  Clinical Narrative:   Called patient in the room, introduced role, and explained that PT recommendations would be discussed. Patient is agreeable to home health. Reviewed CMS scores for agencies that accept [facility] and she prefers [facility]. They have accepted referral for PT and can add RN if needed. Patient currently on acute oxygen. Will follow for this need. No further concerns. CSW encouraged patient to contact CSW as needed. CSW will continue to follow patient for support and facilitate return home once stable.               Expected Discharge Plan: Home w Home Health Services Barriers to Discharge: Continued Medical Work up   Patient Goals and CMS Choice   CMS Medicare.gov Compare Post Acute Care list provided to:: Patient Choice offered to / list presented to : Patient  Expected Discharge Plan and Services Expected Discharge Plan: Home w Home Health Services     Post Acute Care Choice: Home Health Living arrangements for the past 2 months: Single Family Home                           HH Arranged: PT HH Agency: [facility] Date [facility] Agency Contacted: 05/22/22   Representative spoke with at [facility] Agency: Lorenza Chick  Prior Living Arrangements/Services Living arrangements for the past 2 months: Single Family Home Lives with:: Spouse Patient language and need for interpreter reviewed:: Yes Do you feel safe going back to the place where you live?: Yes      Need for Family Participation in Patient Care: Yes (Comment)     Criminal Activity/Legal Involvement Pertinent to Current Situation/Hospitalization: No - Comment as needed  Activities of Daily Living Home Assistive Devices/Equipment: Dan Humphreys (specify type) ADL  Screening (condition at time of admission) Patient's cognitive ability adequate to safely complete daily activities?: Yes Is the patient deaf or have difficulty hearing?: No Does the patient have difficulty seeing, even when wearing glasses/contacts?: No Does the patient have difficulty concentrating, remembering, or making decisions?: No Patient able to express need for assistance with ADLs?: Yes Does the patient have difficulty dressing or bathing?: No Independently performs ADLs?: Yes (appropriate for developmental age) Does the patient have difficulty walking or climbing stairs?: Yes Weakness of Legs: Both Weakness of Arms/Hands: Both  Permission Sought/Granted Permission sought to share information with : Facility Industrial/product designer granted to share information with : Yes, Verbal Permission Granted     Permission granted to share info w AGENCY: Home Health Agencies        Emotional Assessment Appearance:: Appears stated age Attitude/Demeanor/Rapport: Engaged, Gracious Affect (typically observed): Accepting, Appropriate, Calm, Pleasant Orientation: : Oriented to Self, Oriented to Place, Oriented to  Time, Oriented to Situation Alcohol / Substance Use: Not Applicable Psych Involvement: No (comment)  Admission diagnosis:  Influenza A [J10.1] Acute pulmonary embolism (HCC) [I26.99] Acute respiratory failure with hypoxia (HCC) [J96.01] Altered mental status, unspecified altered mental status type [R41.82] Acute pulmonary embolism with acute cor pulmonale, unspecified pulmonary embolism type (HCC) [I26.09] Acute hypoxic respiratory failure (HCC) [J96.01] Patient Active Problem List   Diagnosis Date Noted   Obesity (BMI 30-39.9) 05/20/2022   Hyperparathyroidism (HCC) 05/20/2022   Chronic pain 05/20/2022  Essential hypertension 05/20/2022   Tobacco abuse 05/20/2022   Acute pulmonary embolism (HCC) 05/19/2022   Influenza due to identified novel influenza A virus  with other respiratory manifestations 05/19/2022   COPD (chronic obstructive pulmonary disease) (HCC) 05/19/2022   AKI (acute kidney injury) (HCC) 05/19/2022   Acute respiratory failure with hypoxia (HCC) 05/19/2022   Hypercalcemia 05/19/2022   PCP:  Danella Penton, MD Pharmacy:   [id number] [place], [place] - [address] AT [facility] OF [address] [place] [address] Phone: [phone] Fax: [phone]     Social Determinants of Health (SDOH) Interventions    Readmission Risk Interventions     No data to display

## 2022-05-22 NOTE — Evaluation (Signed)
Physical Therapy Evaluation Patient Details Name: Ann Silva MRN: [id number] DOB: 26-Apr-1946 Today's Date: 05/22/2022  History of Present Illness  76 year old female with a medical history significant for COPD coronary artery disease, history of pulmonary embolism.  She presented to the emergency room via EMS day ago for evaluation of shortness of breath and cough.  s/p mechanical thrombectomy of a pulmonary embolism 11/13.  Clinical Impression  Pt eager to try and get up and do some activity/ambulation.  She was on 2L O2 on arrival with SpO2 in the high 90s, maintained mid 90s at rest on room air.  She did not have any excessive DOE/SOB, but SpO2 down to 87% with 70 ft of in-room ambulation.  Able to increase to mid 90s quickly with rest, maintained 90s with second bout of ambulation.  Pt determined to ambulate w/o AD, and did so though with slow and guarded gait (that she reports is far from baseline).  She did show better cadence and confidence with HHA, encouraged LRAD when she initially d/c.     Recommendations for follow up therapy are one component of a multi-disciplinary discharge planning process, led by the attending physician.  Recommendations may be updated based on patient status, additional functional criteria and insurance authorization.  Follow Up Recommendations Home health PT      Assistance Recommended at Discharge Intermittent Supervision/Assistance  Patient can return home with the following  A little help with walking and/or transfers;A little help with bathing/dressing/bathroom;Assistance with cooking/housework;Help with stairs or ramp for entrance;Assist for transportation    Equipment Recommendations None recommended by PT  Recommendations for Other Services       Functional Status Assessment Patient has had a recent decline in their functional status and demonstrates the ability to make significant improvements in function in a reasonable and predictable amount  of time.     Precautions / Restrictions Precautions Precautions: Fall Restrictions Weight Bearing Restrictions: No      Mobility  Bed Mobility   Bed Mobility: Supine to Sit     Supine to sit: Supervision     General bed mobility comments: easily transitions to sitting EOB    Transfers Overall transfer level: Modified independent Equipment used: Rolling walker (2 wheels)               General transfer comment: Pt was able to rise to standing w/o AD, did tend to reach out toward HHA but able to stabilize w/o AD.    Ambulation/Gait Ambulation/Gait assistance: Min assist, Min guard Gait Distance (Feet): 70 Feet Assistive device: IV Pole, 1 person hand held assist         General Gait Details: 70 ft X 2, PT with shuffling/guarded gait with no LOBs.  She did show increased stability with light HHA but was determinted to try most of the effort w/o UEs.  PTs O2 did drop to 87% on first 70 ft bout on room air, but with seated PLB quickly rose back up to the mid 90s, second bout of ambulation (another ~66ft) on room air with SpO2 remaining >92%.  Stairs            Wheelchair Mobility    Modified Rankin (Stroke Patients Only)       Balance Overall balance assessment: Mild deficits observed, not formally tested (some unstediness with no LOBs during ambulation w/o AD)  Pertinent Vitals/Pain Pain Assessment Pain Assessment: No/denies pain    Home Living Family/patient expects to be discharged to:: Private residence Living Arrangements: Spouse/significant other;Other relatives Available Help at Discharge: Family;Available 24 hours/day   Home Access: Stairs to enter Entrance Stairs-Rails: Right;Left Entrance Stairs-Number of Steps: 2     Home Equipment: Rollator (4 wheels);Cane - single point      Prior Function Prior Level of Function : Independent/Modified Independent                      Hand Dominance        Extremity/Trunk Assessment   Upper Extremity Assessment Upper Extremity Assessment: Overall WFL for tasks assessed    Lower Extremity Assessment Lower Extremity Assessment: Overall WFL for tasks assessed       Communication   Communication: No difficulties  Cognition Arousal/Alertness: Awake/alert Behavior During Therapy: WFL for tasks assessed/performed Overall Cognitive Status: Within Functional Limits for tasks assessed                                          General Comments General comments (skin integrity, edema, etc.): Pt reports feeling weaker and less steady than her baseline, but "felt very good" to get out of bed and do some activity.    Exercises     Assessment/Plan    PT Assessment Patient needs continued PT services  PT Problem List Decreased strength;Decreased range of motion;Decreased activity tolerance;Decreased balance;Decreased safety awareness;Decreased knowledge of use of DME;Cardiopulmonary status limiting activity       PT Treatment Interventions DME instruction;Stair training;Gait training;Functional mobility training;Therapeutic activities;Therapeutic exercise;Balance training;Cognitive remediation;Patient/family education    PT Goals (Current goals can be found in the Care Plan section)  Acute Rehab PT Goals Patient Stated Goal: go home PT Goal Formulation: With patient Time For Goal Achievement: 06/04/22 Potential to Achieve Goals: Good    Frequency Min 2X/week     Co-evaluation               AM-PAC PT "6 Clicks" Mobility  Outcome Measure Help needed turning from your back to your side while in a flat bed without using bedrails?: None Help needed moving from lying on your back to sitting on the side of a flat bed without using bedrails?: None Help needed moving to and from a bed to a chair (including a wheelchair)?: None Help needed standing up from a chair using your arms (e.g.,  wheelchair or bedside chair)?: None Help needed to walk in hospital room?: A Little Help needed climbing 3-5 steps with a railing? : A Little 6 Click Score: 22    End of Session Equipment Utilized During Treatment:  (on 2L on arrival, high 90s, mid 90s on room air, down to high 80s with first room air bout of ambulation, maintains 90s after recovery even with further ambulation) Activity Tolerance: Patient tolerated treatment well;Patient limited by fatigue Patient left: with call bell/phone within reach;with chair alarm set Nurse Communication: Mobility status PT Visit Diagnosis: Muscle weakness (generalized) (M62.81);Difficulty in walking, not elsewhere classified (R26.2);Unsteadiness on feet (R26.81)    Time: 6644-0347 PT Time Calculation (min) (ACUTE ONLY): 27 min   Charges:   PT Evaluation $PT Eval Low Complexity: 1 Low PT Treatments $Therapeutic Exercise: 8-22 mins        Malachi Pro, DPT 05/22/2022, 10:37 AM

## 2022-05-22 NOTE — Progress Notes (Signed)
PROGRESS NOTE    Ann Silva  WGN:[id number] DOB: 16-Dec-1945 DOA: 05/19/2022 PCP: Danella Penton, MD   Assessment & Plan:   Principal Problem:   Acute respiratory failure with hypoxia [facility]) Active Problems:   Acute pulmonary embolism (HCC)   Influenza due to identified novel influenza A virus with other respiratory manifestations   AKI (acute kidney injury) (HCC)   COPD (chronic obstructive pulmonary disease) (HCC)   Hypercalcemia   Obesity (BMI 30-39.9)   Hyperparathyroidism (HCC)   Chronic pain   Essential hypertension   Tobacco abuse  Assessment and Plan: Acute pulmonary embolus: w/ right heart strain as per CT. S/p mechanical thrombectomy 11/13 w/ vascular surgery. History PE some years ago treated with coumadin. TTE with normal left and right heart function, grade 1 dd. Continue on eliquis. IV heparin was d/c   Influenza A: continue on tamiflu    Acute hypoxic respiratory failure: O2 in 70s initially on 4 L now weaned to 2 L breathing relatively comfortably. Secondary to PE, flu, and underlying COPD. Weaned off of supplemental oxygen   AKI: resolved   Left renal incidentaloma: need outpatient f/u    COPD w/ exacerbation: continue on bronchodilators, prednisone & encourage incentive spirometry  GAD: severity unknown. Holding home dose of paroxetine, temazepam    HTN: continue on metoprolol. Aldactone on hold aldactone    Overactive bladder: continue on home oxybutynin    Tobacco abuse: received smoking cessation counseling    Obesity: BMI 36.6. Complicates overall care & prognosis      DVT prophylaxis: lovenox  Code Status:  full  Family Communication: discussed pt's care w/ pt's husband, Virl Diamond, and answered his questions  Disposition Plan: likely d/c back home tomorrow  Level of care: Progressive  Status is: Inpatient Remains inpatient appropriate because: likely d/c home tomorrow     Consultants:  Vasc surg   Procedures  Antimicrobials:     Subjective: Pt c/o fatigue   Objective: Vitals:   05/22/22 0002 05/22/22 0259 05/22/22 0728 05/22/22 0806  BP: 118/72 124/69 113/69   Pulse: (!) 41 (!) 54 (!) 57 (!) 46  Resp: 20 17 18    Temp: 98 F (36.7 C) 97.8 F (36.6 C) 98.2 F (36.8 C)   TempSrc: Oral Oral    SpO2: 92% 94% 93%   Weight:      Height:        Intake/Output Summary (Last 24 hours) at 05/22/2022 0848 Last data filed at 05/22/2022 0749 Gross per 24 hour  Intake 720 ml  Output 1475 ml  Net -755 ml   Filed Weights   05/19/22 1347 05/19/22 2130 05/20/22 0533  Weight: 80.7 kg 80.5 kg 82.3 kg    Examination:  General exam: Appears calm and comfortable  Respiratory system: diminished breath sounds b/l. Cardiovascular system: S1 & S2+. No rubs, gallops or clicks.  Gastrointestinal system: Abdomen is obesity, soft and nontender. Normal bowel sounds heard. Central nervous system: Alert and oriented. Moves all extremities  Psychiatry: Judgement and insight appear normal. Flat mood and affect.     Data Reviewed: I have personally reviewed following labs and imaging studies  CBC: Recent Labs  Lab 05/19/22 1348 05/20/22 0037 05/21/22 0614 05/22/22 0634  WBC 8.3 5.7 4.2 4.4  NEUTROABS 7.2  --   --   --   HGB 15.2* 14.4 13.0 13.3  HCT 45.3 43.3 40.1 40.1  MCV 92.1 92.3 93.7 92.6  PLT 201 159 142* 150   Basic Metabolic Panel: Recent Labs  Lab 05/19/22 1348 05/20/22 0037 05/22/22 0634  NA 138 136 138  K 4.8 3.5 4.0  CL 109 103 106  CO2 23 24 19*  GLUCOSE 144* 128* 200*  BUN 26* 23 18  CREATININE 1.04* 0.90 0.71  CALCIUM 10.5* 9.7 10.1  MG  --   --  2.2   GFR: Estimated Creatinine Clearance: 54.7 mL/min (by C-G formula based on SCr of 0.71 mg/dL). Liver Function Tests: Recent Labs  Lab 05/19/22 1348  AST 41  ALT 25  ALKPHOS 99  BILITOT 1.1  PROT 7.5  ALBUMIN 3.5   No results for input(s): "LIPASE", "AMYLASE" in the last 168 hours. No results for input(s): "AMMONIA" in the  last 168 hours. Coagulation Profile: Recent Labs  Lab 05/19/22 1549  INR 1.1   Cardiac Enzymes: No results for input(s): "CKTOTAL", "CKMB", "CKMBINDEX", "TROPONINI" in the last 168 hours. BNP (last 3 results) No results for input(s): "PROBNP" in the last 8760 hours. HbA1C: No results for input(s): "HGBA1C" in the last 72 hours. CBG: No results for input(s): "GLUCAP" in the last 168 hours. Lipid Profile: No results for input(s): "CHOL", "HDL", "LDLCALC", "TRIG", "CHOLHDL", "LDLDIRECT" in the last 72 hours. Thyroid Function Tests: Recent Labs    05/22/22 0728  TSH 0.354   Anemia Panel: No results for input(s): "VITAMINB12", "FOLATE", "FERRITIN", "TIBC", "IRON", "RETICCTPCT" in the last 72 hours. Sepsis Labs: Recent Labs  Lab 05/19/22 1348 05/19/22 1358 05/19/22 1623  PROCALCITON  --  <0.10  --   LATICACIDVEN 2.2*  --  1.5    Recent Results (from the past 240 hour(s))  Resp Panel by RT-PCR (Flu A&B, Covid) Anterior Nasal Swab     Status: Abnormal   Collection Time: 05/19/22  1:48 PM   Specimen: Anterior Nasal Swab  Result Value Ref Range Status   SARS Coronavirus 2 by RT PCR NEGATIVE NEGATIVE Final    Comment: (NOTE) SARS-CoV-2 target nucleic acids are NOT DETECTED.  The SARS-CoV-2 RNA is generally detectable in upper respiratory specimens during the acute phase of infection. The lowest concentration of SARS-CoV-2 viral copies this assay can detect is 138 copies/mL. A negative result does not preclude SARS-Cov-2 infection and should not be used as the sole basis for treatment or other patient management decisions. A negative result may occur with  improper specimen collection/handling, submission of specimen other than nasopharyngeal swab, presence of viral mutation(s) within the areas targeted by this assay, and inadequate number of viral copies(<138 copies/mL). A negative result must be combined with clinical observations, patient history, and  epidemiological information. The expected result is Negative.  Fact Sheet for Patients:  BloggerCourse.com  Fact Sheet for Healthcare Providers:  SeriousBroker.it  This test is no t yet approved or cleared by the [place] FDA and  has been authorized for detection and/or diagnosis of SARS-CoV-2 by FDA under an Emergency Use Authorization (EUA). This EUA will remain  in effect (meaning this test can be used) for the duration of the COVID-19 declaration under Section 564(b)(1) of the Act, 21 U.S.C.section 360bbb-3(b)(1), unless the authorization is terminated  or revoked sooner.       Influenza A by PCR POSITIVE (A) NEGATIVE Final   Influenza B by PCR NEGATIVE NEGATIVE Final    Comment: (NOTE) The Xpert Xpress SARS-CoV-2/FLU/RSV plus assay is intended as an aid in the diagnosis of influenza from Nasopharyngeal swab specimens and should not be used as a sole basis for treatment. Nasal washings and aspirates are unacceptable for Xpert Xpress  SARS-CoV-2/FLU/RSV testing.  Fact Sheet for Patients: BloggerCourse.com  Fact Sheet for Healthcare Providers: SeriousBroker.it  This test is not yet approved or cleared by the [place] FDA and has been authorized for detection and/or diagnosis of SARS-CoV-2 by FDA under an Emergency Use Authorization (EUA). This EUA will remain in effect (meaning this test can be used) for the duration of the COVID-19 declaration under Section 564(b)(1) of the Act, 21 U.S.C. section 360bbb-3(b)(1), unless the authorization is terminated or revoked.  Performed at [facility], [address] [place], [place] [address]   Culture, blood (routine x 2)     Status: None (Preliminary result)   Collection Time: 05/19/22  1:48 PM   Specimen: BLOOD  Result Value Ref Range Status   Specimen Description BLOOD BLOOD LEFT HAND  Final   Special  Requests   Final    BOTTLES DRAWN AEROBIC AND ANAEROBIC Blood Culture adequate volume   Culture   Final    NO GROWTH 3 DAYS Performed at [facility], [address] [place], [place] [address]    Report Status PENDING  Incomplete  Culture, blood (routine x 2)     Status: None (Preliminary result)   Collection Time: 05/19/22  1:48 PM   Specimen: BLOOD  Result Value Ref Range Status   Specimen Description BLOOD BLOOD RIGHT FOREARM  Final   Special Requests   Final    BOTTLES DRAWN AEROBIC AND ANAEROBIC Blood Culture adequate volume   Culture   Final    NO GROWTH 3 DAYS Performed at [facility], [address], [place], [place] [address]    Report Status PENDING  Incomplete         Radiology Studies: PERIPHERAL VASCULAR CATHETERIZATION  Result Date: 05/20/2022 See surgical note for result.       Scheduled Meds:  apixaban  10 mg Oral BID   Followed by   Melene Muller ON 05/28/2022] apixaban  5 mg Oral BID   metoprolol succinate  50 mg Oral Daily   mometasone-formoterol  2 puff Inhalation BID   montelukast  10 mg Oral Daily   oseltamivir  30 mg Oral BID   oxybutynin  5 mg Oral BID   pantoprazole  40 mg Oral Daily   PARoxetine  40 mg Oral Daily   predniSONE  40 mg Oral Q breakfast   Continuous Infusions:   LOS: 3 days    Time spent: 25 mins     Charise Killian, MD [facility] Pager [phone]  If 7PM-7AM, please contact night-coverage 05/22/2022, 8:48 AM

## 2022-05-22 NOTE — Progress Notes (Signed)
CROSS COVER NOTE  NAME: Adaysia Faidley MRN: [id number] DOB : 1945-08-02 ATTENDING PHYSICIAN: Kathrynn Running, MD    Date of Service   05/22/2022   HPI/Events of Note   Notified of HR in the 40s on telemetry. BP 118/72 and patient is asymptomatic.  Interventions   Assessment/Plan:  EKG--> Sinus Bradycardia, HR 42  Bradycardia, query med effect  Consider down-titration of metoprolol, hold parameters added to current order CBC, BMP, Mg, TSH with AM labs       This document was prepared using Dragon voice recognition software and may include unintentional dictation errors.  Bishop Limbo DNP, MBA, FNP-BC Nurse Practitioner [facility] Pager [phone]

## 2022-05-23 ENCOUNTER — Encounter: Payer: Self-pay | Admitting: Ophthalmology

## 2022-05-23 LAB — BASIC METABOLIC PANEL
Anion gap: 7 (ref 5–15)
BUN: 19 mg/dL (ref 8–23)
CO2: 26 mmol/L (ref 22–32)
Calcium: 10.1 mg/dL (ref 8.9–10.3)
Chloride: 106 mmol/L (ref 98–111)
Creatinine, Ser: 0.65 mg/dL (ref 0.44–1.00)
GFR, Estimated: >60 mL/min (ref >60)
Glucose, Bld: 112 mg/dL — ABNORMAL HIGH (ref 70–99)
Potassium: 3.8 mmol/L (ref 3.5–5.1)
Sodium: 139 mmol/L (ref 135–145)

## 2022-05-23 LAB — CBC
HCT: 38.5 % (ref 36.0–46.0)
Hemoglobin: 12.7 g/dL (ref 12.0–15.0)
MCH: 30.3 pg (ref 26.0–34.0)
MCHC: 33 g/dL (ref 30.0–36.0)
MCV: 91.9 fL (ref 80.0–100.0)
Platelets: 169 10*3/uL (ref 150–400)
RBC: 4.19 MIL/uL (ref 3.87–5.11)
RDW: 14.6 % (ref 11.5–15.5)
WBC: 5.7 10*3/uL (ref 4.0–10.5)
nRBC: 0 % (ref 0.0–0.2)

## 2022-05-23 MED ORDER — OSELTAMIVIR PHOSPHATE 30 MG PO CAPS: 30.0000 mg | ORAL_CAPSULE | Freq: Two times a day (BID) | ORAL | 0 refills | Status: AC

## 2022-05-23 MED ORDER — APIXABAN 5 MG PO TABS: 5.0000 mg | ORAL_TABLET | Freq: Two times a day (BID) | ORAL | 0 refills | Status: DC

## 2022-05-23 MED ORDER — OSELTAMIVIR PHOSPHATE 30 MG PO CAPS: 30.0000 mg | ORAL_CAPSULE | Freq: Two times a day (BID) | ORAL | Status: DC

## 2022-05-23 MED ORDER — APIXABAN 5 MG PO TABS: 10.0000 mg | ORAL_TABLET | Freq: Two times a day (BID) | ORAL | 0 refills | Status: DC

## 2022-05-23 MED ORDER — PREDNISONE 20 MG PO TABS: 40.0000 mg | ORAL_TABLET | Freq: Every day | ORAL | 0 refills | Status: AC

## 2022-05-23 NOTE — Care Management Important Message (Signed)
Important Message  Patient Details  Name: Ann Silva MRN: [id number] Date of Birth: May 02, 1946   Medicare Important Message Given:  Yes  Reviewed Medicare IM with patient via room phone due to isolation status.  Copy of Medicare IM placed in mail to home address on file.    Dannette Barbara 05/23/2022, 1:46 PM

## 2022-05-23 NOTE — TOC Transition Note (Signed)
Transition of Care [facility]) - CM/SW Discharge Note   Patient Details  Name: Queena Monrreal MRN: [id number] Date of Birth: February 13, 1946  Transition of Care [facility]) CM/SW Contact:  Candie Chroman, LCSW Phone Number: 05/23/2022, 1:22 PM   Clinical Narrative:  Patient has orders to discharge home today. Left message for [facility] liaison to notify. She has been weaned to room air. No further concerns. CSW signing off.   Final next level of care: Home w Home Health Services Barriers to Discharge: Barriers Resolved   Patient Goals and CMS Choice   CMS Medicare.gov Compare Post Acute Care list provided to:: Patient Choice offered to / list presented to : Patient  Discharge Placement                    Patient and family notified of of transfer: 05/23/22  Discharge Plan and Services     Post Acute Care Choice: [place] Arranged: PT, OT [facility] Agency: [place] Date [facility] Agency Contacted: 05/23/22   Representative spoke with at [place]: Adela Lank  Social Determinants of Health (SDOH) Interventions     Readmission Risk Interventions     No data to display

## 2022-05-23 NOTE — Discharge Summary (Signed)
Physician Discharge Summary  Ann Silva HWT:[id number] DOB: 1946/03/20 DOA: 05/19/2022  PCP: Rusty Aus, MD  Admit date: 05/19/2022 Discharge date: 05/23/2022  Admitted From: home  Disposition:  home w/ home health   Recommendations for Outpatient Follow-up:  Follow up with PCP in 1-2 weeks F/u w/ vasc surg, Dr. Lucky Cowboy, in 1-3 weeks F/u in regards to likely renal cyst found on CT, see below   Home Health: yes Equipment/Devices:   Discharge Condition: stable  CODE STATUS: full  Diet recommendation: Heart Healthy   Brief/Interim Summary: HPI was taken from Dr. Francine Graven: Ann Silva is a 76 y.o. female with medical history significant for nicotine dependence, COPD, coronary artery disease, history of colon cancer status post partial colectomy, history of pulmonary embolism who presents to the ER via EMS for evaluation of shortness of breath and a cough which started 1 day prior to her admission. Patient states that she developed a cough productive of clear phlegm and over the last 24 hours has had shortness of breath both at rest and with exertion.  She has had a fever even though it was undocumented and per EMS she had room air pulse oximetry of 77% and was placed on 6 L of oxygen with improvement in her pulse oximetry to 92%. She has had a sick contact and states that her husband is currently recovering from an upper respiratory tract infection. She denies having any leg swelling, has not had any recent travels and is not on any hormonal therapy. She denies having any chest pain, no nausea, no vomiting, no dizziness, no lightheadedness, no abdominal pain, no changes in her bowel habits, no urinary symptoms, no focal deficits, no blurred vision. Patient had a fever upon arrival to the ER with a Tmax of 100.8, she is tachypneic with respiratory rate of about 30, lactic acid of 2.2 and a normal white cell count Patient's respiratory viral panel is positive for influenza A She had a CT  angiogram done which showed moderate pulmonary emboli over the right middle lobe pulmonary artery and suggestion of a few small subsegmental emboli over the lower lobes bilaterally. Positive for acute PE with CT evidence of right heart strain (RV/LV Ratio = 1.07) consistent with at least submassive (intermediate risk) PE. The presence of right heart strain has been associated with an increased risk of morbidity and mortality.    As per Dr. Jimmye Norman 11/15-11/16/23: Pt was found to have an acute pulmonary embolus w/ right heart strain as per CT. Pt is s/p mechanical thrombectomy on 11/13 w/ vasc surg. Pt was initially placed on IV heparin but has since been d/c and pt was started on eliquis. Pt was d/c home w/ eliquis. Of note, pt was also found to have influenza A and pt was placed on tamiflu. Pt was also treated for COPD exacerbation w/ steroids, bronchodilators & incentive spirometry. PT evaluated the pt and recommended home health. Home health was set up by CM prior to d/c. For more information, please see previous progress/consult notes.     Discharge Diagnoses:  Principal Problem:   Acute respiratory failure with hypoxia ([place]) Active Problems:   Acute pulmonary embolism (HCC)   Influenza due to identified novel influenza A virus with other respiratory manifestations   AKI (acute kidney injury) ([place])   COPD (chronic obstructive pulmonary disease) (HCC)   Hypercalcemia   Obesity (BMI 30-39.9)   Hyperparathyroidism ([place])   Chronic pain   Essential hypertension   Tobacco abuse  Acute pulmonary embolus:  w/ right heart strain as per CT. S/p mechanical thrombectomy 11/13 w/ vascular surgery. History PE some years ago treated with coumadin. TTE with normal left and right heart function, grade 1 dd. Continue on eliquis. IV heparin was d/c   Influenza A: continue on tamiflu    Acute hypoxic respiratory failure: O2 in 70s initially on 4 L now weaned to 2 L breathing relatively comfortably.  Secondary to PE, flu, and underlying COPD. Weaned off of supplemental oxygen. Resolved   AKI: resolved   Left renal incidentaloma: need outpatient f/u w/ PCP. 2.2 cm left renal hypodensity likely a cyst incompletely evaluated on this noncontrast CT. Recommend ultrasound for further evaluation on an elective basis.   COPD w/ exacerbation: continue on bronchodilators, prednisone & encourage incentive spirometry  GAD: severity unknown. Restart home dose of paroxetine, temazepam    HTN: continue on metoprolol, aldactone    Overactive bladder: continue on home oxybutynin    Tobacco abuse: received smoking cessation counseling    Obesity: BMI 36.6. Complicates overall care & prognosis   Discharge Instructions  Discharge Instructions     Diet - low sodium heart healthy   Complete by: As directed    Discharge instructions   Complete by: As directed    F/u w/ PCP in 1-2 weeks. F/u w/ vasc surg, Dr. Lucky Cowboy, in 1-3 weeks. 2.2 cm left renal hypodensity likely a cyst incompletely evaluated on this noncontrast CT. Recommend ultrasound for further evaluation on an elective basis, which can be ordered by your PCP   Increase activity slowly   Complete by: As directed       Allergies as of 05/23/2022       Reactions   Sulfa Antibiotics Shortness Of Breath        Medication List     TAKE these medications    apixaban 5 MG Tabs tablet Commonly known as: ELIQUIS Take 2 tablets (10 mg total) by mouth 2 (two) times daily for 4 days.   apixaban 5 MG Tabs tablet Commonly known as: ELIQUIS Take 1 tablet (5 mg total) by mouth 2 (two) times daily. Only start this script after completing eliquis '10mg'$  BID x 4 days more Start taking on: May 28, 2022   cyclobenzaprine 10 MG tablet Commonly known as: FLEXERIL Take 10 mg by mouth 3 (three) times daily.   metoprolol succinate 50 MG 24 hr tablet Commonly known as: TOPROL-XL Take 50 mg by mouth daily.   montelukast 10 MG tablet Commonly  known as: SINGULAIR Take 10 mg by mouth daily.   omeprazole 40 MG capsule Commonly known as: PRILOSEC Take 40 mg by mouth daily.   oseltamivir 30 MG capsule Commonly known as: TAMIFLU Take 1 capsule (30 mg total) by mouth 2 (two) times daily for 1 day.   oxybutynin 5 MG tablet Commonly known as: DITROPAN Take 5 mg by mouth 2 (two) times daily.   PARoxetine 40 MG tablet Commonly known as: PAXIL Take 40 mg by mouth daily.   predniSONE 20 MG tablet Commonly known as: DELTASONE Take 2 tablets (40 mg total) by mouth daily with breakfast for 5 days. Start taking on: May 24, 2022   spironolactone 25 MG tablet Commonly known as: ALDACTONE Take 25 mg by mouth daily.   temazepam 30 MG capsule Commonly known as: RESTORIL Take 30 mg by mouth at bedtime as needed.   Wixela Inhub 250-50 MCG/ACT Aepb Generic drug: fluticasone-salmeterol Inhale 1 puff into the lungs in the morning and at bedtime.  Follow-up Information     [facility] Follow up.   Specialty: Home Health Services Why: They will follow up with you for your home health needs. Contact information: [place] [address] [phone]         Algernon Huxley, MD. Schedule an appointment as soon as possible for a visit.   Specialties: Vascular Surgery, Radiology, Interventional Cardiology Why: Please call and scheudle this appointment within 1-3 weeks Contact information: [place] [address] [phone]                Allergies  Allergen Reactions   Sulfa Antibiotics Shortness Of Breath    Consultations: Vasc surg    Procedures/Studies: PERIPHERAL VASCULAR CATHETERIZATION  Result Date: 05/20/2022 See surgical note for result.  ECHOCARDIOGRAM COMPLETE  Result Date: 05/20/2022    ECHOCARDIOGRAM REPORT   Patient Name:   KEILANY BURNETTE Date of Exam: 05/20/2022 Medical Rec #:  [id number]     Height:       59.0 in Accession #:     [id number]    Weight:       181.4 lb Date of Birth:  May 03, 1946     BSA:          1.770 m Patient Age:    76 years      BP:           111/63 mmHg Patient Gender: F             HR:           78 bpm. Exam Location:  [facility] Procedure: 2D Echo, Color Doppler and Cardiac Doppler Indications:     I26.09 Pulmonary Embolus  History:         Patient has no prior history of Echocardiogram examinations.                  COPD; Risk Factors:Hypertension.  Sonographer:     Charmayne Sheer Referring Phys:  [place] Diagnosing Phys: Yolonda Kida MD  Sonographer Comments: Technically difficult study due to poor echo windows. Image acquisition challenging due to COPD. IMPRESSIONS  1. Left ventricular ejection fraction, by estimation, is 50 to 55%. The left ventricle has low normal function. The left ventricle has no regional wall motion abnormalities. Left ventricular diastolic parameters are consistent with Grade I diastolic dysfunction (impaired relaxation).  2. Right ventricular systolic function is normal. The right ventricular size is normal.  3. The mitral valve is normal in structure. No evidence of mitral valve regurgitation.  4. The aortic valve is normal in structure. Aortic valve regurgitation is not visualized. FINDINGS  Left Ventricle: Left ventricular ejection fraction, by estimation, is 50 to 55%. The left ventricle has low normal function. The left ventricle has no regional wall motion abnormalities. The left ventricular internal cavity size was normal in size. There is no concentric left ventricular hypertrophy. Left ventricular diastolic parameters are consistent with Grade I diastolic dysfunction (impaired relaxation). Right Ventricle: The right ventricular size is normal. No increase in right ventricular wall thickness. Right ventricular systolic function is normal. Left Atrium: Left atrial size was normal in size. Right Atrium: Right atrial size was normal in size. Pericardium: There is no evidence of  pericardial effusion. Mitral Valve: The mitral valve is normal in structure. No evidence of mitral valve regurgitation. Tricuspid Valve: The tricuspid valve is normal in structure. Tricuspid valve regurgitation is trivial. Aortic Valve: The aortic valve is normal in structure.  Aortic valve regurgitation is not visualized. Pulmonic Valve: The pulmonic valve was normal in structure. Pulmonic valve regurgitation is not visualized. Aorta: The ascending aorta was not well visualized. IAS/Shunts: No atrial level shunt detected by color flow Doppler.  LEFT VENTRICLE PLAX 2D LVIDd:         3.30 cm Diastology LVIDs:         2.40 cm LV e' medial:    5.87 cm/s LV PW:         1.00 cm LV E/e' medial:  14.8 LV IVS:        1.20 cm LV e' lateral:   7.62 cm/s                        LV E/e' lateral: 11.4  RIGHT VENTRICLE RV Basal diam:  3.20 cm RV Mid diam:    2.70 cm TAPSE (M-mode): 1.9 cm LEFT ATRIUM           Index        RIGHT ATRIUM           Index LA diam:      3.50 cm 1.98 cm/m   RA Area:     14.50 cm LA Vol (A4C): 26.0 ml 14.69 ml/m  RA Volume:   35.40 ml  20.00 ml/m  PULMONIC VALVE PV Vmax:       1.30 m/s PV Vmean:      88.200 cm/s PV VTI:        0.236 m PV Peak grad:  6.8 mmHg PV Mean grad:  4.0 mmHg  MITRAL VALVE MV Area (PHT): 3.39 cm MV Decel Time: 224 msec MV E velocity: 87.00 cm/s MV A velocity: 105.00 cm/s MV E/A ratio:  0.83 Dwayne D Callwood MD Electronically signed by Yolonda Kida MD Signature Date/Time: 05/20/2022/11:54:34 AM    Final    US Venous Img Lower Bilateral (DVT)  Result Date: 05/19/2022 CLINICAL DATA:  Deep vein thrombosis EXAM: BILATERAL LOWER EXTREMITY VENOUS DOPPLER ULTRASOUND TECHNIQUE: Gray-scale sonography with compression, as well as color and duplex ultrasound, were performed to evaluate the deep venous system(s) from the level of the common femoral vein through the popliteal and proximal calf veins. COMPARISON:  None Available. FINDINGS: VENOUS Normal compressibility of the  common femoral, superficial femoral, and popliteal veins, as well as the visualized calf veins. Evaluation of peroneal veins is limited bilaterally. Visualized portions of profunda femoral vein and great saphenous vein unremarkable. No filling defects to suggest DVT on grayscale or color Doppler imaging. Doppler waveforms show normal direction of venous flow, normal respiratory plasticity and response to augmentation. OTHER None. Limitations: none IMPRESSION: No lower extremity DVT. Electronically Signed   By: Keane Police D.O.   On: 05/19/2022 19:34   CT Angio Chest PE W/Cm &/Or Wo Cm  Result Date: 05/19/2022 CLINICAL DATA:  Shortness of breath since last night with hypoxia. Possible pulmonary embolism. EXAM: CT ANGIOGRAPHY CHEST WITH CONTRAST TECHNIQUE: Multidetector CT imaging of the chest was performed using the standard protocol during bolus administration of intravenous contrast. Multiplanar CT image reconstructions and MIPs were obtained to evaluate the vascular anatomy. RADIATION DOSE REDUCTION: This exam was performed according to the departmental dose-optimization program which includes automated exposure control, adjustment of the mA and/or kV according to patient size and/or use of iterative reconstruction technique. CONTRAST:  37m OMNIPAQUE IOHEXOL 350 MG/ML SOLN COMPARISON:  None Available. FINDINGS: Cardiovascular: Heart is normal size. Minimal calcified plaque over the 3 vessel  coronary arteries. Mild calcified plaque over the thoracic aorta which is normal in caliber. Pulmonary arterial system is well opacified and demonstrates moderate emboli over the right middle lobe pulmonary artery and suggestion of a few small subsegmental emboli over the lower lobes bilaterally. RV/LV ratio is 1.07. Remaining vascular structures are unremarkable. Mediastinum/Nodes: 1.2 cm precarinal lymph node. No other enlarged mediastinal or hilar lymph nodes. Remaining mediastinal structures are unremarkable.  Lungs/Pleura: Lungs are adequately inflated and demonstrate mild posterior left basilar atelectasis. Dependent atelectasis with paraseptal emphysematous disease over the posterior right upper lobe. Minimal paraseptal emphysematous disease over the left apex. No effusion. No pneumothorax. Airways are normal. Upper Abdomen: Calcified plaque over the abdominal aorta. Previous cholecystectomy. Surgical clips adjacent the colon in the right abdomen. 2.2 cm hypodensity over the left kidney with Hounsfield unit measurements 15 likely a cyst. Musculoskeletal: Mild degenerative change of the spine. Partially visualized posterior fusion hardware over the lumbar spine Review of the MIP images confirms the above findings. IMPRESSION: 1. Moderate pulmonary emboli over the right middle lobe pulmonary artery and suggestion of a few small subsegmental emboli over the lower lobes bilaterally. Positive for acute PE with CTevidence of right heart strain (RV/LV Ratio = 1.07) consistent with at least submassive (intermediate risk) PE. The presence of right heart strain has been associated with an increased risk of morbidity and mortality. 2. 1.2 cm precarinal lymph node likely reactive. 3. Aortic atherosclerosis. Atherosclerotic coronary artery disease. 4. 2.2 cm left renal hypodensity likely a cyst incompletely evaluated on this noncontrast CT. Recommend ultrasound for further evaluation on an elective basis. 5. Emphysema. Aortic atherosclerosis. Atherosclerotic coronary artery disease. Aortic Atherosclerosis (ICD10-I70.0) and Emphysema (ICD10-J43.9). Critical Value/emergent results were called by telephone at the time of interpretation on 05/19/2022 at 3:28 pm to provider [facility] , who verbally acknowledged these results. Electronically Signed   By: Marin Olp M.D.   On: 05/19/2022 15:28   CT Head Wo Contrast  Result Date: 05/19/2022 CLINICAL DATA:  Short of breath, hypoxia, altered level of consciousness EXAM: CT HEAD  WITHOUT CONTRAST TECHNIQUE: Contiguous axial images were obtained from the base of the skull through the vertex without intravenous contrast. RADIATION DOSE REDUCTION: This exam was performed according to the departmental dose-optimization program which includes automated exposure control, adjustment of the mA and/or kV according to patient size and/or use of iterative reconstruction technique. COMPARISON:  None Available. FINDINGS: Brain: No acute infarct or hemorrhage. Lateral ventricles and midline structures are unremarkable. No acute extra-axial fluid collections. No mass effect. Vascular: No hyperdense vessel or unexpected calcification. Skull: Normal. Negative for fracture or focal lesion. Sinuses/Orbits: No acute finding. Other: None. IMPRESSION: 1. No acute intracranial process. Electronically Signed   By: Randa Ngo M.D.   On: 05/19/2022 15:16   DG Chest Portable 1 View  Result Date: 05/19/2022 CLINICAL DATA:  Shortness of breath EXAM: PORTABLE CHEST 1 VIEW COMPARISON:  None Available. FINDINGS: Cardiomegaly. Mild, diffuse bilateral interstitial pulmonary opacity. The visualized skeletal structures are unremarkable. IMPRESSION: Cardiomegaly with mild, diffuse bilateral interstitial pulmonary opacity, likely edema. No focal airspace opacity. Electronically Signed   By: Delanna Ahmadi M.D.   On: 05/19/2022 14:15   (Echo, Carotid, EGD, Colonoscopy, ERCP)    Subjective: Pt denies any complaints   Discharge Exam: Vitals:   05/23/22 0813 05/23/22 1242  BP:  134/75  Pulse: (!) 58 71  Resp:  16  Temp:  98.2 F (36.8 C)  SpO2:  92%   Vitals:   05/23/22  0515 05/23/22 0800 05/23/22 0813 05/23/22 1242  BP: 139/82 (!) 147/92  134/75  Pulse: (!) 58 62 (!) 58 71  Resp: '18 16  16  '$ Temp: 98.2 F (36.8 C) 98.4 F (36.9 C)  98.2 F (36.8 C)  TempSrc:      SpO2: 95% 92%  92%  Weight:      Height:        General: Pt is alert, awake, not in acute distress Cardiovascular: S1/S2 +, no  rubs, no gallops Respiratory: decreased breath sounds b/l  Abdominal: Soft, NT, obese, bowel sounds + Extremities:  no cyanosis    The results of significant diagnostics from this hospitalization (including imaging, microbiology, ancillary and laboratory) are listed below for reference.     Microbiology: Recent Results (from the past 240 hour(s))  Resp Panel by RT-PCR (Flu A&B, Covid) Anterior Nasal Swab     Status: Abnormal   Collection Time: 05/19/22  1:48 PM   Specimen: Anterior Nasal Swab  Result Value Ref Range Status   SARS Coronavirus 2 by RT PCR NEGATIVE NEGATIVE Final    Comment: (NOTE) SARS-CoV-2 target nucleic acids are NOT DETECTED.  The SARS-CoV-2 RNA is generally detectable in upper respiratory specimens during the acute phase of infection. The lowest concentration of SARS-CoV-2 viral copies this assay can detect is 138 copies/mL. A negative result does not preclude SARS-Cov-2 infection and should not be used as the sole basis for treatment or other patient management decisions. A negative result may occur with  improper specimen collection/handling, submission of specimen other than nasopharyngeal swab, presence of viral mutation(s) within the areas targeted by this assay, and inadequate number of viral copies(<138 copies/mL). A negative result must be combined with clinical observations, patient history, and epidemiological information. The expected result is Negative.  Fact Sheet for Patients:  EntrepreneurPulse.com.au  Fact Sheet for Healthcare Providers:  IncredibleEmployment.be  This test is no t yet approved or cleared by the [place] FDA and  has been authorized for detection and/or diagnosis of SARS-CoV-2 by FDA under an Emergency Use Authorization (EUA). This EUA will remain  in effect (meaning this test can be used) for the duration of the COVID-19 declaration under Section 564(b)(1) of the Act,  21 U.S.C.section 360bbb-3(b)(1), unless the authorization is terminated  or revoked sooner.       Influenza A by PCR POSITIVE (A) NEGATIVE Final   Influenza B by PCR NEGATIVE NEGATIVE Final    Comment: (NOTE) The Xpert Xpress SARS-CoV-2/FLU/RSV plus assay is intended as an aid in the diagnosis of influenza from Nasopharyngeal swab specimens and should not be used as a sole basis for treatment. Nasal washings and aspirates are unacceptable for Xpert Xpress SARS-CoV-2/FLU/RSV testing.  Fact Sheet for Patients: EntrepreneurPulse.com.au  Fact Sheet for Healthcare Providers: IncredibleEmployment.be  This test is not yet approved or cleared by the [place] FDA and has been authorized for detection and/or diagnosis of SARS-CoV-2 by FDA under an Emergency Use Authorization (EUA). This EUA will remain in effect (meaning this test can be used) for the duration of the COVID-19 declaration under Section 564(b)(1) of the Act, 21 U.S.C. section 360bbb-3(b)(1), unless the authorization is terminated or revoked.  Performed at [facility], [address] [place], [place] [address]   Culture, blood (routine x 2)     Status: None (Preliminary result)   Collection Time: 05/19/22  1:48 PM   Specimen: BLOOD  Result Value Ref Range Status   Specimen Description BLOOD BLOOD LEFT HAND  Final   Special Requests   Final    BOTTLES DRAWN AEROBIC AND ANAEROBIC Blood Culture adequate volume   Culture   Final    NO GROWTH 4 DAYS Performed at [facility], [address] [place], [place] [address]    Report Status PENDING  Incomplete  Culture, blood (routine x 2)     Status: None (Preliminary result)   Collection Time: 05/19/22  1:48 PM   Specimen: BLOOD  Result Value Ref Range Status   Specimen Description BLOOD BLOOD RIGHT FOREARM  Final   Special Requests   Final    BOTTLES DRAWN AEROBIC AND ANAEROBIC Blood Culture adequate volume    Culture   Final    NO GROWTH 4 DAYS Performed at [facility], [address] [place], [place] [address]    Report Status PENDING  Incomplete     Labs: BNP (last 3 results) Recent Labs    05/19/22 1348  BNP 962.2*   Basic Metabolic Panel: Recent Labs  Lab 05/19/22 1348 05/20/22 0037 05/22/22 0634 05/23/22 0645  NA 138 136 138 139  K 4.8 3.5 4.0 3.8  CL 109 103 106 106  CO2 23 24 19* 26  GLUCOSE 144* 128* 200* 112*  BUN 26* '23 18 19  '$ CREATININE 1.04* 0.90 0.71 0.65  CALCIUM 10.5* 9.7 10.1 10.1  MG  --   --  2.2  --    Liver Function Tests: Recent Labs  Lab 05/19/22 1348  AST 41  ALT 25  ALKPHOS 99  BILITOT 1.1  PROT 7.5  ALBUMIN 3.5   No results for input(s): "LIPASE", "AMYLASE" in the last 168 hours. No results for input(s): "AMMONIA" in the last 168 hours. CBC: Recent Labs  Lab 05/19/22 1348 05/20/22 0037 05/21/22 0614 05/22/22 0634 05/23/22 0645  WBC 8.3 5.7 4.2 4.4 5.7  NEUTROABS 7.2  --   --   --   --   HGB 15.2* 14.4 13.0 13.3 12.7  HCT 45.3 43.3 40.1 40.1 38.5  MCV 92.1 92.3 93.7 92.6 91.9  PLT 201 159 142* 150 169   Cardiac Enzymes: No results for input(s): "CKTOTAL", "CKMB", "CKMBINDEX", "TROPONINI" in the last 168 hours. BNP: Invalid input(s): "POCBNP" CBG: No results for input(s): "GLUCAP" in the last 168 hours. D-Dimer No results for input(s): "DDIMER" in the last 72 hours. Hgb A1c No results for input(s): "HGBA1C" in the last 72 hours. Lipid Profile No results for input(s): "CHOL", "HDL", "LDLCALC", "TRIG", "CHOLHDL", "LDLDIRECT" in the last 72 hours. Thyroid function studies Recent Labs    05/22/22 0728  TSH 0.354   Anemia work up No results for input(s): "VITAMINB12", "FOLATE", "FERRITIN", "TIBC", "IRON", "RETICCTPCT" in the last 72 hours. Urinalysis    Component Value Date/Time   COLORURINE STRAW (A) 05/19/2022 1623   APPEARANCEUR CLEAR (A) 05/19/2022 1623   LABSPEC 1.014 05/19/2022 1623   PHURINE 6.0  05/19/2022 1623   GLUCOSEU NEGATIVE 05/19/2022 1623   HGBUR NEGATIVE 05/19/2022 1623   BILIRUBINUR NEGATIVE 05/19/2022 1623   KETONESUR NEGATIVE 05/19/2022 1623   PROTEINUR NEGATIVE 05/19/2022 1623   NITRITE NEGATIVE 05/19/2022 1623   LEUKOCYTESUR NEGATIVE 05/19/2022 1623   Sepsis Labs Recent Labs  Lab 05/20/22 0037 05/21/22 0614 05/22/22 0634 05/23/22 0645  WBC 5.7 4.2 4.4 5.7   Microbiology Recent Results (from the past 240 hour(s))  Resp Panel by RT-PCR (Flu A&B, Covid) Anterior Nasal Swab     Status: Abnormal   Collection Time: 05/19/22  1:48 PM  Specimen: Anterior Nasal Swab  Result Value Ref Range Status   SARS Coronavirus 2 by RT PCR NEGATIVE NEGATIVE Final    Comment: (NOTE) SARS-CoV-2 target nucleic acids are NOT DETECTED.  The SARS-CoV-2 RNA is generally detectable in upper respiratory specimens during the acute phase of infection. The lowest concentration of SARS-CoV-2 viral copies this assay can detect is 138 copies/mL. A negative result does not preclude SARS-Cov-2 infection and should not be used as the sole basis for treatment or other patient management decisions. A negative result may occur with  improper specimen collection/handling, submission of specimen other than nasopharyngeal swab, presence of viral mutation(s) within the areas targeted by this assay, and inadequate number of viral copies(<138 copies/mL). A negative result must be combined with clinical observations, patient history, and epidemiological information. The expected result is Negative.  Fact Sheet for Patients:  EntrepreneurPulse.com.au  Fact Sheet for Healthcare Providers:  IncredibleEmployment.be  This test is no t yet approved or cleared by the [place] FDA and  has been authorized for detection and/or diagnosis of SARS-CoV-2 by FDA under an Emergency Use Authorization (EUA). This EUA will remain  in effect (meaning this test can be  used) for the duration of the COVID-19 declaration under Section 564(b)(1) of the Act, 21 U.S.C.section 360bbb-3(b)(1), unless the authorization is terminated  or revoked sooner.       Influenza A by PCR POSITIVE (A) NEGATIVE Final   Influenza B by PCR NEGATIVE NEGATIVE Final    Comment: (NOTE) The Xpert Xpress SARS-CoV-2/FLU/RSV plus assay is intended as an aid in the diagnosis of influenza from Nasopharyngeal swab specimens and should not be used as a sole basis for treatment. Nasal washings and aspirates are unacceptable for Xpert Xpress SARS-CoV-2/FLU/RSV testing.  Fact Sheet for Patients: EntrepreneurPulse.com.au  Fact Sheet for Healthcare Providers: IncredibleEmployment.be  This test is not yet approved or cleared by the [place] FDA and has been authorized for detection and/or diagnosis of SARS-CoV-2 by FDA under an Emergency Use Authorization (EUA). This EUA will remain in effect (meaning this test can be used) for the duration of the COVID-19 declaration under Section 564(b)(1) of the Act, 21 U.S.C. section 360bbb-3(b)(1), unless the authorization is terminated or revoked.  Performed at [facility], [address] [place], [place] [address]   Culture, blood (routine x 2)     Status: None (Preliminary result)   Collection Time: 05/19/22  1:48 PM   Specimen: BLOOD  Result Value Ref Range Status   Specimen Description BLOOD BLOOD LEFT HAND  Final   Special Requests   Final    BOTTLES DRAWN AEROBIC AND ANAEROBIC Blood Culture adequate volume   Culture   Final    NO GROWTH 4 DAYS Performed at [facility], [address] [place], [place] [address]    Report Status PENDING  Incomplete  Culture, blood (routine x 2)     Status: None (Preliminary result)   Collection Time: 05/19/22  1:48 PM   Specimen: BLOOD  Result Value Ref Range Status   Specimen Description BLOOD BLOOD RIGHT FOREARM  Final   Special  Requests   Final    BOTTLES DRAWN AEROBIC AND ANAEROBIC Blood Culture adequate volume   Culture   Final    NO GROWTH 4 DAYS Performed at [facility], [address] [place], [place] [address]    Report Status PENDING  Incomplete     Time coordinating discharge: Over 30 minutes  SIGNED:   August Saucer  Jimmye Norman, MD  [facility] 05/23/2022, 2:50 PM Pager   If 7PM-7AM, please contact night-coverage [email or web address]

## 2022-05-24 LAB — CULTURE, BLOOD (ROUTINE X 2)
Culture: NO GROWTH
Culture: NO GROWTH
Special Requests: ADEQUATE
Special Requests: ADEQUATE

## 2022-06-06 DIAGNOSIS — I2609 Other pulmonary embolism with acute cor pulmonale: Secondary | ICD-10-CM | POA: Insufficient documentation

## 2022-06-10 ENCOUNTER — Other Ambulatory Visit: Payer: Self-pay | Admitting: Internal Medicine

## 2022-06-10 DIAGNOSIS — N281 Cyst of kidney, acquired: Secondary | ICD-10-CM

## 2022-06-13 ENCOUNTER — Ambulatory Visit
Admission: RE | Admit: 2022-06-13 | Discharge: 2022-06-13 | Disposition: A | Discharge status: Home / Self Care | Payer: Medicare PPO | Source: Ambulatory Visit | Attending: Internal Medicine | Admitting: Internal Medicine

## 2022-06-13 DIAGNOSIS — N281 Cyst of kidney, acquired: Secondary | ICD-10-CM | POA: Diagnosis present

## 2022-06-20 ENCOUNTER — Encounter (INDEPENDENT_AMBULATORY_CARE_PROVIDER_SITE_OTHER): Payer: Self-pay | Admitting: Nurse Practitioner

## 2022-06-20 ENCOUNTER — Ambulatory Visit (INDEPENDENT_AMBULATORY_CARE_PROVIDER_SITE_OTHER): Payer: Medicare PPO | Admitting: Nurse Practitioner

## 2022-06-20 VITALS — BP 117/75 | HR 92 | Resp 18 | Ht <= 58 in | Wt 183.0 lb

## 2022-06-20 DIAGNOSIS — I2699 Other pulmonary embolism without acute cor pulmonale: Secondary | ICD-10-CM

## 2022-06-20 DIAGNOSIS — I10 Essential (primary) hypertension: Secondary | ICD-10-CM | POA: Diagnosis not present

## 2022-07-07 ENCOUNTER — Encounter (INDEPENDENT_AMBULATORY_CARE_PROVIDER_SITE_OTHER): Payer: Self-pay | Admitting: Nurse Practitioner

## 2022-07-07 NOTE — Progress Notes (Signed)
Subjective:    Patient ID: Ann Silva, female    DOB: [birth year], 76 y.o.   MRN: [id number] Chief Complaint  Patient presents with   Follow-up    2 weeks no studies    Ann Silva is a 76 year old female who presented to [facility] on 05/19/2022 and was noted to subsequently have a pulmonary embolism.  The patient underwent intervention on 05/20/2022 including:  Procedure(s) Performed:             1.  Contrast injection right heart             2.  Mechanical thrombectomy using the penumbra CAT 12 catheter to the left lower lobe pulmonary artery             3.  Mechanical thrombectomy using the penumbra CAT 12 catheter to the right middle and lower lobe pulmonary arteries             4.  Selective catheter placement right lower lobe, middle lobe, and upper lobe pulmonary arteries             5.  Selective catheter placement left lower lobe and upper lobe pulmonary arteries   The patient is doing well post pulmonary thrombectomy.  She is currently compliant with Eliquis.  Overall she is doing well.    Review of Systems     Objective:   Physical Exam Vitals reviewed.  Cardiovascular:     Rate and Rhythm: Normal rate and regular rhythm.  Pulmonary:     Effort: Pulmonary effort is normal.  Skin:    General: Skin is warm and dry.  Neurological:     Mental Status: She is alert and oriented to person, place, and time.  Psychiatric:        Mood and Affect: Mood normal.        Behavior: Behavior normal.        Thought Content: Thought content normal.        Judgment: Judgment normal.     BP 117/75 (BP Location: Right Arm)   Pulse 92   Resp 18   Ht '4\' 10"'$  (1.473 m)   Wt 183 lb (83 kg)   BMI 38.25 kg/m   Past Medical History:  Diagnosis Date   Anemia    takes ferrous sulfate daily   Anxiety    Arthritis    Bladder spasm    Chronic back pain    spinal stenosis   Colon cancer (HCC)    Complication of anesthesia    B/P bottoms out, hard  to wake up    Contact dermatitis    COPD (chronic obstructive pulmonary disease) (HCC)    Albuterol inhaler and DuoNeb as needed   COPD (chronic obstructive pulmonary disease) (HCC)    Coronary artery disease    mild by 08/25/09 cath   Foot drop, left    Headache    Heart murmur    never had any problems   History of blood transfusion    no abnormal reaction noted   History of bronchitis    History of MRSA infection 6+ yrs ago   Hypertension    takes Metoprolol daily   Hypertension    Insomnia    takes Trazodone nightly   Joint pain    Joint swelling    Lower extremity edema    Nocturia    Panic attacks    takes Effexor daily   Peripheral edema    takes Lasix  daily and takes Bumex every 3 days.Takes Aldactone daily    Pulmonary emboli ([place]) 2018   Restless leg syndrome    takes Requip daily   RLS (restless legs syndrome)    Sleep apnea    doesnt use CPAP    Social History   Socioeconomic History   Marital status: Married    Spouse name: Not on file   Number of children: Not on file   Years of education: Not on file   Highest education level: Not on file  Occupational History   Not on file  Tobacco Use   Smoking status: Former    Types: Cigarettes   Smokeless tobacco: Never  Vaping Use   Vaping Use: Never used  Substance and Sexual Activity   Alcohol use: No   Drug use: No   Sexual activity: Not on file  Other Topics Concern   Not on file  Social History Narrative   ** Merged History Encounter **       Social Determinants of Health   Financial Resource Strain: Not on file  Food Insecurity: No Food Insecurity (05/19/2022)   Hunger Vital Sign    Worried About Running Out of Food in the Last Year: Never true    Ran Out of Food in the Last Year: Never true  Transportation Needs: No Transportation Needs (05/19/2022)   PRAPARE - Hydrologist (Medical): No    Lack of Transportation (Non-Medical): No  Physical Activity: Not on  file  Stress: Not on file  Social Connections: Not on file  Intimate Partner Violence: Not At Risk (05/19/2022)   Humiliation, Afraid, Rape, and Kick questionnaire    Fear of Current or Ex-Partner: No    Emotionally Abused: No    Physically Abused: No    Sexually Abused: No    Past Surgical History:  Procedure Laterality Date   ABDOMINAL HYSTERECTOMY     BACK SURGERY     BREAST EXCISIONAL BIOPSY Left 1980's   neg   CATARACT EXTRACTION W/PHACO Right 10/09/2021   Procedure: CATARACT EXTRACTION PHACO AND INTRAOCULAR LENS PLACEMENT (IOC) RIGHT 23.94 01:52.0;  Surgeon: Birder Robson, MD;  Location: [facility];  Service: Ophthalmology;  Laterality: Right;   CATARACT EXTRACTION W/PHACO Left 10/23/2021   Procedure: CATARACT EXTRACTION PHACO AND INTRAOCULAR LENS PLACEMENT (IOC) LEFT 7.34 00:47.4;  Surgeon: Birder Robson, MD;  Location: [facility];  Service: Ophthalmology;  Laterality: Left;   CHOLECYSTECTOMY     COLON SURGERY     partial colectomy d/t cancer cells   COLONOSCOPY     HIP CLOSED REDUCTION Left 12/11/2020   Procedure: CLOSED REDUCTION HIP;  Surgeon: Hessie Knows, MD;  Location: [facility];  Service: Orthopedics;  Laterality: Left;   I&D of left hip      x 4   INTERSTIM IMPLANT PLACEMENT N/A 03/08/2019   Procedure: Barrie Lyme IMPLANT FIRST AND SECOND STAGE WITH IMPEDENCE CHECK;  Surgeon: Bjorn Loser, MD;  Location: [facility];  Service: Urology;  Laterality: N/A;   JOINT REPLACEMENT     PULMONARY THROMBECTOMY Right 05/20/2022   Procedure: PULMONARY THROMBECTOMY;  Surgeon: Algernon Huxley, MD;  Location: [facility];  Service: Cardiovascular;  Laterality: Right;  Bilateral subsegmental   Resection total hip     Revision of total hip arthroplasty     x3-4   right hip replacement     TOTAL HIP ARTHROPLASTY Left     Family History  Problem Relation Age of Onset  Cancer Mother    Stroke Father    Diabetes Sister    Mental illness Sister      Allergies  Allergen Reactions   Hydrocodone-Chlorpheniramine Shortness Of Breath   Sulfa Antibiotics Shortness Of Breath   Sulfa Antibiotics Shortness Of Breath   Chlorhexidine Rash   Methadone Other (See Comments)    Altered Mental Status   Benadryl [Diphenhydramine] Anxiety   Latex Rash   Tape Rash    "the white kind they use after surgery"       Latest Ref Rng & Units 05/23/2022    6:45 AM 05/22/2022    6:34 AM 05/21/2022    6:14 AM  CBC  WBC 4.0 - 10.5 K/uL 5.7  4.4  4.2   Hemoglobin 12.0 - 15.0 g/dL 12.7  13.3  13.0   Hematocrit 36.0 - 46.0 % 38.5  40.1  40.1   Platelets 150 - 400 K/uL 169  150  142       CMP     Component Value Date/Time   NA 139 05/23/2022 0645   NA 138 01/10/2016 1537   NA 139 07/09/2014 1218   K 3.8 05/23/2022 0645   K 3.4 (L) 07/09/2014 1218   CL 106 05/23/2022 0645   CL 104 07/09/2014 1218   CO2 26 05/23/2022 0645   CO2 27 07/09/2014 1218   GLUCOSE 112 (H) 05/23/2022 0645   GLUCOSE 97 07/09/2014 1218   BUN 19 05/23/2022 0645   BUN 16 01/10/2016 1537   BUN 23 (H) 07/09/2014 1218   CREATININE 0.65 05/23/2022 0645   CREATININE 0.52 10/14/2014 1114   CALCIUM 10.1 05/23/2022 0645   CALCIUM 10.5 (H) 10/14/2014 1114   PROT 7.5 05/19/2022 1348   PROT 7.3 10/14/2014 1114   ALBUMIN 3.5 05/19/2022 1348   ALBUMIN 3.9 10/14/2014 1114   AST 41 05/19/2022 1348   AST 23 10/14/2014 1114   ALT 25 05/19/2022 1348   ALT 18 10/14/2014 1114   ALKPHOS 99 05/19/2022 1348   ALKPHOS 101 10/14/2014 1114   BILITOT 1.1 05/19/2022 1348   BILITOT 0.5 10/14/2014 1114   GFRNONAA >60 05/23/2022 0645   GFRNONAA >60 10/14/2014 1114   GFRAA >60 03/03/2019 1250   GFRAA >60 10/14/2014 1114     No results found.     Assessment & Plan:   1. Other acute pulmonary embolism without acute cor pulmonale (HCC) Currently patient is doing well post pulmonary embolism.  She underwent thrombectomy on 05/20/2022.  She is tolerating her Eliquis without issue.   Patient should plan on continuing with Eliquis for 1 year.  Will have the patient return at that time to discuss options.  2. Essential hypertension Continue antihypertensive medications as already ordered, these medications have been reviewed and there are no changes at this time.   Current Outpatient Medications on File Prior to Visit  Medication Sig Dispense Refill   apixaban (ELIQUIS) 5 MG TABS tablet Take 1 tablet (5 mg total) by mouth 2 (two) times daily. Only start this script after completing eliquis '10mg'$  BID x 4 days more 60 tablet 0   ARIPiprazole (ABILIFY) 5 MG tablet Take 5 mg by mouth at bedtime.     aspirin 81 MG chewable tablet Chew by mouth daily.     fluticasone-salmeterol (ADVAIR) 250-50 MCG/ACT AEPB Inhale into the lungs.     montelukast (SINGULAIR) 10 MG tablet Take 10 mg by mouth daily.     omeprazole (PRILOSEC) 40 MG capsule Take 40 mg by  mouth daily.     oxybutynin (DITROPAN) 5 MG tablet Take 5 mg by mouth 2 (two) times daily.     PARoxetine (PAXIL) 40 MG tablet Take 40 mg by mouth daily at 2 PM.      rOPINIRole (REQUIP) 3 MG tablet Take 1 tablet (3 mg total) by mouth 4 (four) times daily. 360 tablet 4   spironolactone (ALDACTONE) 25 MG tablet Take 25 mg by mouth daily at 2 PM.      spironolactone (ALDACTONE) 25 MG tablet Take 25 mg by mouth daily.     TRELEGY ELLIPTA 200-62.5-25 MCG/INH AEPB Inhale 1 puff into the lungs daily.     acetaminophen (TYLENOL) 500 MG tablet Take 1,000 mg by mouth every 6 (six) hours as needed for moderate pain or headache. (Patient not taking: Reported on 06/20/2022)     albuterol (PROVENTIL HFA;VENTOLIN HFA) 108 (90 Base) MCG/ACT inhaler Inhale 2 puffs into the lungs every 4 (four) hours as needed for wheezing or shortness of breath. (Patient not taking: Reported on 06/20/2022) 1 Inhaler 0   apixaban (ELIQUIS) 5 MG TABS tablet Take 2 tablets (10 mg total) by mouth 2 (two) times daily for 4 days. 16 tablet 0   buPROPion (WELLBUTRIN) 75 MG  tablet Take 75 mg by mouth 2 (two) times daily. Unsure of dose (Patient not taking: Reported on 06/20/2022)     cyclobenzaprine (FLEXERIL) 10 MG tablet Take 10 mg by mouth 3 (three) times daily.     etodolac (LODINE) 400 MG tablet Take 400 mg by mouth 2 (two) times daily. (Patient not taking: Reported on 06/20/2022)     fluticasone (FLONASE) 50 MCG/ACT nasal spray Place 1 spray into both nostrils daily as needed (for sinus issues).  (Patient not taking: Reported on 06/20/2022)     furosemide (LASIX) 20 MG tablet Take 1 tablet (20 mg total) by mouth daily. (Patient not taking: Reported on 06/20/2022) 15 tablet 0   gabapentin (NEURONTIN) 100 MG capsule Take 200 mg by mouth at bedtime. (Patient not taking: Reported on 12/11/2020)  11   ipratropium-albuterol (DUONEB) 0.5-2.5 (3) MG/3ML SOLN Take 3 mLs by nebulization every 6 (six) hours as needed (for wheezing/shortness of breath). (Patient not taking: Reported on 06/20/2022)     metoprolol succinate (TOPROL-XL) 50 MG 24 hr tablet Take 1 tablet (50 mg total) by mouth every evening. Take with or immediately following a meal. (Patient not taking: Reported on 06/20/2022) 30 tablet 1   metoprolol succinate (TOPROL-XL) 50 MG 24 hr tablet Take 50 mg by mouth daily.     montelukast (SINGULAIR) 10 MG tablet Take 10 mg by mouth daily.     Multiple Vitamin (MULTIVITAMIN WITH MINERALS) TABS tablet Take 2 tablets by mouth daily. (Patient not taking: Reported on 06/20/2022)     nystatin (MYCOSTATIN) 100000 UNIT/ML suspension Take 5 mLs by mouth daily at 12 noon. (Patient not taking: Reported on 06/20/2022)     PARoxetine (PAXIL) 40 MG tablet Take 40 mg by mouth daily. (Patient not taking: Reported on 06/20/2022)     temazepam (RESTORIL) 15 MG capsule Take 15 mg by mouth at bedtime as needed for sleep. (Patient not taking: Reported on 10/01/2021)     temazepam (RESTORIL) 30 MG capsule Take 30 mg by mouth at bedtime as needed. (Patient not taking: Reported on 06/20/2022)      WIXELA INHUB 250-50 MCG/ACT AEPB Inhale 1 puff into the lungs in the morning and at bedtime. (Patient not taking: Reported on 06/20/2022)  No current facility-administered medications on file prior to visit.    There are no Patient Instructions on file for this visit. No follow-ups on file.   Kris Hartmann, NP

## 2022-08-12 ENCOUNTER — Other Ambulatory Visit: Payer: Self-pay

## 2022-08-12 ENCOUNTER — Inpatient Hospital Stay
Admission: EM | Admit: 2022-08-12 | Discharge: 2022-08-15 | DRG: 871 | Disposition: A | Discharge status: Home / Self Care | Payer: Medicare PPO | Attending: Family Medicine | Admitting: Family Medicine

## 2022-08-12 ENCOUNTER — Emergency Department: Payer: Medicare PPO

## 2022-08-12 DIAGNOSIS — Z96649 Presence of unspecified artificial hip joint: Secondary | ICD-10-CM

## 2022-08-12 DIAGNOSIS — I2609 Other pulmonary embolism with acute cor pulmonale: Secondary | ICD-10-CM | POA: Diagnosis present

## 2022-08-12 DIAGNOSIS — Z96643 Presence of artificial hip joint, bilateral: Secondary | ICD-10-CM | POA: Diagnosis present

## 2022-08-12 DIAGNOSIS — J9601 Acute respiratory failure with hypoxia: Secondary | ICD-10-CM | POA: Diagnosis present

## 2022-08-12 DIAGNOSIS — I700 Atherosclerosis of aorta: Secondary | ICD-10-CM | POA: Diagnosis present

## 2022-08-12 DIAGNOSIS — E210 Primary hyperparathyroidism: Secondary | ICD-10-CM | POA: Diagnosis present

## 2022-08-12 DIAGNOSIS — F17200 Nicotine dependence, unspecified, uncomplicated: Secondary | ICD-10-CM | POA: Diagnosis present

## 2022-08-12 DIAGNOSIS — D72829 Elevated white blood cell count, unspecified: Secondary | ICD-10-CM | POA: Diagnosis present

## 2022-08-12 DIAGNOSIS — N10 Acute tubulo-interstitial nephritis: Secondary | ICD-10-CM | POA: Diagnosis present

## 2022-08-12 DIAGNOSIS — E538 Deficiency of other specified B group vitamins: Secondary | ICD-10-CM | POA: Diagnosis present

## 2022-08-12 DIAGNOSIS — Z1152 Encounter for screening for COVID-19: Secondary | ICD-10-CM

## 2022-08-12 DIAGNOSIS — Z79899 Other long term (current) drug therapy: Secondary | ICD-10-CM

## 2022-08-12 DIAGNOSIS — Z87891 Personal history of nicotine dependence: Secondary | ICD-10-CM | POA: Diagnosis not present

## 2022-08-12 DIAGNOSIS — G9341 Metabolic encephalopathy: Secondary | ICD-10-CM | POA: Diagnosis present

## 2022-08-12 DIAGNOSIS — I2510 Atherosclerotic heart disease of native coronary artery without angina pectoris: Secondary | ICD-10-CM | POA: Diagnosis present

## 2022-08-12 DIAGNOSIS — Z7901 Long term (current) use of anticoagulants: Secondary | ICD-10-CM | POA: Diagnosis not present

## 2022-08-12 DIAGNOSIS — Z902 Acquired absence of lung [part of]: Secondary | ICD-10-CM | POA: Diagnosis not present

## 2022-08-12 DIAGNOSIS — D649 Anemia, unspecified: Secondary | ICD-10-CM | POA: Diagnosis present

## 2022-08-12 DIAGNOSIS — Z8614 Personal history of Methicillin resistant Staphylococcus aureus infection: Secondary | ICD-10-CM

## 2022-08-12 DIAGNOSIS — Z7951 Long term (current) use of inhaled steroids: Secondary | ICD-10-CM

## 2022-08-12 DIAGNOSIS — M48061 Spinal stenosis, lumbar region without neurogenic claudication: Secondary | ICD-10-CM | POA: Diagnosis present

## 2022-08-12 DIAGNOSIS — J449 Chronic obstructive pulmonary disease, unspecified: Secondary | ICD-10-CM | POA: Diagnosis present

## 2022-08-12 DIAGNOSIS — A419 Sepsis, unspecified organism: Principal | ICD-10-CM | POA: Diagnosis present

## 2022-08-12 DIAGNOSIS — Z91040 Latex allergy status: Secondary | ICD-10-CM

## 2022-08-12 DIAGNOSIS — Z6841 Body Mass Index (BMI) 40.0 and over, adult: Secondary | ICD-10-CM

## 2022-08-12 DIAGNOSIS — G2581 Restless legs syndrome: Secondary | ICD-10-CM | POA: Diagnosis present

## 2022-08-12 DIAGNOSIS — Z86711 Personal history of pulmonary embolism: Secondary | ICD-10-CM | POA: Diagnosis not present

## 2022-08-12 DIAGNOSIS — Z823 Family history of stroke: Secondary | ICD-10-CM

## 2022-08-12 DIAGNOSIS — I10 Essential (primary) hypertension: Secondary | ICD-10-CM | POA: Diagnosis present

## 2022-08-12 DIAGNOSIS — R4182 Altered mental status, unspecified: Secondary | ICD-10-CM | POA: Diagnosis present

## 2022-08-12 DIAGNOSIS — K219 Gastro-esophageal reflux disease without esophagitis: Secondary | ICD-10-CM | POA: Diagnosis present

## 2022-08-12 DIAGNOSIS — A4151 Sepsis due to Escherichia coli [E. coli]: Principal | ICD-10-CM | POA: Diagnosis present

## 2022-08-12 DIAGNOSIS — H9209 Otalgia, unspecified ear: Secondary | ICD-10-CM | POA: Diagnosis not present

## 2022-08-12 DIAGNOSIS — I959 Hypotension, unspecified: Secondary | ICD-10-CM | POA: Diagnosis present

## 2022-08-12 DIAGNOSIS — G4700 Insomnia, unspecified: Secondary | ICD-10-CM | POA: Diagnosis present

## 2022-08-12 DIAGNOSIS — Z833 Family history of diabetes mellitus: Secondary | ICD-10-CM

## 2022-08-12 DIAGNOSIS — Z9049 Acquired absence of other specified parts of digestive tract: Secondary | ICD-10-CM

## 2022-08-12 DIAGNOSIS — Z818 Family history of other mental and behavioral disorders: Secondary | ICD-10-CM

## 2022-08-12 DIAGNOSIS — Z885 Allergy status to narcotic agent status: Secondary | ICD-10-CM

## 2022-08-12 DIAGNOSIS — R6520 Severe sepsis without septic shock: Secondary | ICD-10-CM | POA: Diagnosis present

## 2022-08-12 DIAGNOSIS — F319 Bipolar disorder, unspecified: Secondary | ICD-10-CM | POA: Diagnosis present

## 2022-08-12 DIAGNOSIS — N390 Urinary tract infection, site not specified: Secondary | ICD-10-CM | POA: Diagnosis not present

## 2022-08-12 DIAGNOSIS — Z888 Allergy status to other drugs, medicaments and biological substances status: Secondary | ICD-10-CM

## 2022-08-12 DIAGNOSIS — Z85038 Personal history of other malignant neoplasm of large intestine: Secondary | ICD-10-CM

## 2022-08-12 DIAGNOSIS — Z90710 Acquired absence of both cervix and uterus: Secondary | ICD-10-CM

## 2022-08-12 DIAGNOSIS — Z7982 Long term (current) use of aspirin: Secondary | ICD-10-CM

## 2022-08-12 DIAGNOSIS — Z882 Allergy status to sulfonamides status: Secondary | ICD-10-CM

## 2022-08-12 LAB — COMPREHENSIVE METABOLIC PANEL
ALT: 19 U/L (ref 0–44)
AST: 21 U/L (ref 15–41)
Albumin: 3.1 g/dL — ABNORMAL LOW (ref 3.5–5.0)
Alkaline Phosphatase: 108 U/L (ref 38–126)
Anion gap: 10 (ref 5–15)
BUN: 21 mg/dL (ref 8–23)
CO2: 24 mmol/L (ref 22–32)
Calcium: 10.4 mg/dL — ABNORMAL HIGH (ref 8.9–10.3)
Chloride: 101 mmol/L (ref 98–111)
Creatinine, Ser: 1.06 mg/dL — ABNORMAL HIGH (ref 0.44–1.00)
GFR, Estimated: 54 mL/min — ABNORMAL LOW (ref >60)
Glucose, Bld: 111 mg/dL — ABNORMAL HIGH (ref 70–99)
Potassium: 3.6 mmol/L (ref 3.5–5.1)
Sodium: 135 mmol/L (ref 135–145)
Total Bilirubin: 0.7 mg/dL (ref 0.3–1.2)
Total Protein: 7.3 g/dL (ref 6.5–8.1)

## 2022-08-12 LAB — PROCALCITONIN: Procalcitonin: 6.53 ng/mL

## 2022-08-12 LAB — RESP PANEL BY RT-PCR (RSV, FLU A&B, COVID)  RVPGX2
Influenza A by PCR: NEGATIVE
Influenza B by PCR: NEGATIVE
Resp Syncytial Virus by PCR: NEGATIVE
SARS Coronavirus 2 by RT PCR: NEGATIVE

## 2022-08-12 LAB — LACTIC ACID, PLASMA
Lactic Acid, Venous: 1.2 mmol/L (ref 0.5–1.9)
Lactic Acid, Venous: 1.8 mmol/L (ref 0.5–1.9)

## 2022-08-12 LAB — CBC WITH DIFFERENTIAL/PLATELET
Abs Immature Granulocytes: 0.11 10*3/uL — ABNORMAL HIGH (ref 0.00–0.07)
Basophils Absolute: 0.1 10*3/uL (ref 0.0–0.1)
Basophils Relative: 0 %
Eosinophils Absolute: 0 10*3/uL (ref 0.0–0.5)
Eosinophils Relative: 0 %
HCT: 40.9 % (ref 36.0–46.0)
Hemoglobin: 13.4 g/dL (ref 12.0–15.0)
Immature Granulocytes: 1 %
Lymphocytes Relative: 4 %
Lymphs Abs: 0.5 10*3/uL — ABNORMAL LOW (ref 0.7–4.0)
MCH: 30.6 pg (ref 26.0–34.0)
MCHC: 32.8 g/dL (ref 30.0–36.0)
MCV: 93.4 fL (ref 80.0–100.0)
Monocytes Absolute: 0.7 10*3/uL (ref 0.1–1.0)
Monocytes Relative: 5 %
Neutro Abs: 12.4 10*3/uL — ABNORMAL HIGH (ref 1.7–7.7)
Neutrophils Relative %: 90 %
Platelets: 276 10*3/uL (ref 150–400)
RBC: 4.38 MIL/uL (ref 3.87–5.11)
RDW: 14.4 % (ref 11.5–15.5)
WBC: 13.8 10*3/uL — ABNORMAL HIGH (ref 4.0–10.5)
nRBC: 0 % (ref 0.0–0.2)

## 2022-08-12 LAB — URINALYSIS, W/ REFLEX TO CULTURE (INFECTION SUSPECTED)
Bilirubin Urine: NEGATIVE
Glucose, UA: NEGATIVE mg/dL
Ketones, ur: NEGATIVE mg/dL
Nitrite: POSITIVE — AB
Protein, ur: 100 mg/dL — AB
Specific Gravity, Urine: 1.013 (ref 1.005–1.030)
WBC, UA: >50 WBC/hpf (ref 0–5)
pH: 6 (ref 5.0–8.0)

## 2022-08-12 LAB — PROTIME-INR
INR: 1.5 — ABNORMAL HIGH (ref 0.8–1.2)
Prothrombin Time: 17.9 seconds — ABNORMAL HIGH (ref 11.4–15.2)

## 2022-08-12 LAB — APTT: aPTT: 33 seconds (ref 24–36)

## 2022-08-12 LAB — BRAIN NATRIURETIC PEPTIDE: B Natriuretic Peptide: 93.5 pg/mL (ref 0.0–100.0)

## 2022-08-12 MED ORDER — MONTELUKAST SODIUM 10 MG PO TABS
10.0000 mg | ORAL_TABLET | Freq: Every day | ORAL | Status: DC
  Administered 2022-08-13 – 2022-08-14 (×2): 10 mg via ORAL
  Filled 2022-08-12 (×2): qty 1

## 2022-08-12 MED ORDER — BUPROPION HCL 75 MG PO TABS
75.0000 mg | ORAL_TABLET | Freq: Two times a day (BID) | ORAL | Status: DC
  Administered 2022-08-13 – 2022-08-15 (×5): 75 mg via ORAL
  Filled 2022-08-12 (×6): qty 1

## 2022-08-12 MED ORDER — LACTATED RINGERS IV BOLUS (SEPSIS)
1000.0000 mL | Freq: Once | INTRAVENOUS | Status: AC
  Administered 2022-08-12: 1000 mL via INTRAVENOUS

## 2022-08-12 MED ORDER — ACETAMINOPHEN 650 MG RE SUPP: 650.0000 mg | Freq: Four times a day (QID) | RECTAL | Status: DC | PRN

## 2022-08-12 MED ORDER — ARIPIPRAZOLE 5 MG PO TABS
5.0000 mg | ORAL_TABLET | Freq: Every day | ORAL | Status: DC
  Administered 2022-08-13 – 2022-08-14 (×3): 5 mg via ORAL
  Filled 2022-08-12 (×4): qty 1

## 2022-08-12 MED ORDER — ONDANSETRON HCL 4 MG/2ML IJ SOLN: 4.0000 mg | Freq: Four times a day (QID) | INTRAMUSCULAR | Status: DC | PRN

## 2022-08-12 MED ORDER — MOMETASONE FURO-FORMOTEROL FUM 200-5 MCG/ACT IN AERO
2.0000 | INHALATION_SPRAY | Freq: Two times a day (BID) | RESPIRATORY_TRACT | Status: DC
  Administered 2022-08-13 – 2022-08-15 (×3): 2 via RESPIRATORY_TRACT
  Filled 2022-08-12: qty 8.8

## 2022-08-12 MED ORDER — ROPINIROLE HCL 1 MG PO TABS: 2.0000 mg | ORAL_TABLET | Freq: Four times a day (QID) | ORAL | Status: DC | PRN

## 2022-08-12 MED ORDER — ROPINIROLE HCL 1 MG PO TABS: 2.0000 mg | ORAL_TABLET | Freq: Three times a day (TID) | ORAL | Status: DC | PRN

## 2022-08-12 MED ORDER — VANCOMYCIN HCL IN DEXTROSE 1-5 GM/200ML-% IV SOLN
1000.0000 mg | Freq: Once | INTRAVENOUS | Status: AC
  Administered 2022-08-12: 1000 mg via INTRAVENOUS
  Filled 2022-08-12: qty 200

## 2022-08-12 MED ORDER — LACTATED RINGERS IV BOLUS
1000.0000 mL | Freq: Once | INTRAVENOUS | Status: AC
  Administered 2022-08-12: 1000 mL via INTRAVENOUS

## 2022-08-12 MED ORDER — IPRATROPIUM-ALBUTEROL 0.5-2.5 (3) MG/3ML IN SOLN: 3.0000 mL | Freq: Four times a day (QID) | RESPIRATORY_TRACT | Status: DC | PRN

## 2022-08-12 MED ORDER — SODIUM CHLORIDE 0.9 % IV SOLN
2.0000 g | Freq: Two times a day (BID) | INTRAVENOUS | Status: DC
  Administered 2022-08-13 – 2022-08-15 (×5): 2 g via INTRAVENOUS
  Filled 2022-08-12: qty 12.5
  Filled 2022-08-12: qty 2
  Filled 2022-08-12 (×3): qty 12.5

## 2022-08-12 MED ORDER — ACETAMINOPHEN 325 MG PO TABS: 650.0000 mg | ORAL_TABLET | Freq: Four times a day (QID) | ORAL | Status: DC | PRN

## 2022-08-12 MED ORDER — LACTATED RINGERS IV BOLUS (SEPSIS): 1000.0000 mL | Freq: Once | INTRAVENOUS | Status: DC

## 2022-08-12 MED ORDER — SENNOSIDES-DOCUSATE SODIUM 8.6-50 MG PO TABS: 1.0000 | ORAL_TABLET | Freq: Every evening | ORAL | Status: DC | PRN

## 2022-08-12 MED ORDER — HYDRALAZINE HCL 20 MG/ML IJ SOLN: 5.0000 mg | Freq: Three times a day (TID) | INTRAMUSCULAR | Status: DC | PRN

## 2022-08-12 MED ORDER — SODIUM CHLORIDE 0.9 % IV SOLN
2.0000 g | Freq: Once | INTRAVENOUS | Status: AC
  Administered 2022-08-12: 2 g via INTRAVENOUS
  Filled 2022-08-12: qty 12.5

## 2022-08-12 MED ORDER — IPRATROPIUM-ALBUTEROL 0.5-2.5 (3) MG/3ML IN SOLN
RESPIRATORY_TRACT | Status: AC
  Filled 2022-08-12: qty 3

## 2022-08-12 MED ORDER — ALBUTEROL SULFATE HFA 108 (90 BASE) MCG/ACT IN AERS: 2.0000 | INHALATION_SPRAY | RESPIRATORY_TRACT | Status: DC | PRN

## 2022-08-12 MED ORDER — METRONIDAZOLE 500 MG/100ML IV SOLN
500.0000 mg | Freq: Once | INTRAVENOUS | Status: AC
  Administered 2022-08-12: 500 mg via INTRAVENOUS
  Filled 2022-08-12: qty 100

## 2022-08-12 MED ORDER — SODIUM CHLORIDE 0.9 % IV SOLN
500.0000 mg | INTRAVENOUS | Status: DC
  Administered 2022-08-12: 500 mg via INTRAVENOUS
  Filled 2022-08-12: qty 5

## 2022-08-12 MED ORDER — TEMAZEPAM 15 MG PO CAPS
30.0000 mg | ORAL_CAPSULE | Freq: Every evening | ORAL | Status: DC | PRN
  Administered 2022-08-14: 30 mg via ORAL
  Filled 2022-08-12: qty 2

## 2022-08-12 MED ORDER — ONDANSETRON HCL 4 MG PO TABS: 4.0000 mg | ORAL_TABLET | Freq: Four times a day (QID) | ORAL | Status: DC | PRN

## 2022-08-12 MED ORDER — ALBUTEROL SULFATE (2.5 MG/3ML) 0.083% IN NEBU: 2.5000 mg | INHALATION_SOLUTION | RESPIRATORY_TRACT | Status: DC | PRN

## 2022-08-12 MED ORDER — VANCOMYCIN HCL 1250 MG/250ML IV SOLN: 1250.0000 mg | INTRAVENOUS | Status: DC

## 2022-08-12 MED ORDER — PANTOPRAZOLE SODIUM 40 MG PO TBEC
80.0000 mg | DELAYED_RELEASE_TABLET | Freq: Every day | ORAL | Status: DC
  Administered 2022-08-13 – 2022-08-15 (×3): 80 mg via ORAL
  Filled 2022-08-12 (×3): qty 2

## 2022-08-12 MED ORDER — VANCOMYCIN HCL 500 MG/100ML IV SOLN
500.0000 mg | Freq: Once | INTRAVENOUS | Status: AC
  Administered 2022-08-12: 500 mg via INTRAVENOUS
  Filled 2022-08-12: qty 100

## 2022-08-12 MED ORDER — IOHEXOL 350 MG/ML SOLN
75.0000 mL | Freq: Once | INTRAVENOUS | Status: AC | PRN
  Administered 2022-08-12: 75 mL via INTRAVENOUS

## 2022-08-12 MED ORDER — PAROXETINE HCL 20 MG PO TABS
40.0000 mg | ORAL_TABLET | Freq: Every day | ORAL | Status: DC
  Administered 2022-08-13 – 2022-08-15 (×3): 40 mg via ORAL
  Filled 2022-08-12 (×3): qty 2

## 2022-08-12 MED ORDER — ACETAMINOPHEN 325 MG PO TABS
650.0000 mg | ORAL_TABLET | Freq: Once | ORAL | Status: AC
  Administered 2022-08-12: 650 mg via ORAL
  Filled 2022-08-12: qty 2

## 2022-08-12 MED ORDER — APIXABAN 5 MG PO TABS
5.0000 mg | ORAL_TABLET | Freq: Two times a day (BID) | ORAL | Status: DC
  Administered 2022-08-13 – 2022-08-15 (×6): 5 mg via ORAL
  Filled 2022-08-12 (×6): qty 1

## 2022-08-12 MED ORDER — IPRATROPIUM-ALBUTEROL 0.5-2.5 (3) MG/3ML IN SOLN
3.0000 mL | Freq: Once | RESPIRATORY_TRACT | Status: AC
  Administered 2022-08-12: 3 mL via RESPIRATORY_TRACT
  Filled 2022-08-12: qty 3

## 2022-08-12 NOTE — H&P (Incomplete)
History and Physical   Ann Silva ZHG:[id number] DOB: June 04, 1946 DOA: 08/12/2022  PCP: Rusty Aus, MD  Patient coming from: home   I have personally briefly reviewed patient's old medical records in [place].  Chief Concern: altered mental status, fever  HPI: Ann Silva is a 77 year old female with history of hypertension, GERD, history of acute PE and status post lobectomy in 05/20/2022, on Eliquis, COPD, spinal stenosis of the lumbar region, B12 deficiency, aortic atherosclerosis, morbid obesity, bipolar affective disorder, who presents emergency department for chief concerns of altered mental status and fever.  Initial vitals in the ED showed temperature of 100.9, respiration rate of 20, heart rate of 85, blood pressure 108/61, SpO2 of 90% on 2 L nasal cannula.  Of note in the ED, ED team tested 2 L oxygen removal and patient desatted to 87% on room air.  Patient's nasal cannula was replaced back to the nares immediately.  Serum sodium is 135, potassium 3.6, chloride 101, bicarb 24, BUN of 21, serum creatinine of 1.06, EGFR 54, nonfasting blood glucose 111, WBC 13.8, hemoglobin 13.4, platelets of 276.  COVID/influenza A/influenza B/RSV PCR were negative.  ED treatment: Acetaminophen 650 mg p.o. one-time dose, DuoNebs x 1, cefepime, LR 1 L bolus, vancomycin, metronidazole. ----------------- At bedside, she is able to me her name, age, current location, and currently calendar year.  She reports difficulty breathing, that started about several days ago. She is uncertain of exact date of onset she endorses increased cough that is nonproductive. She states has been difficult to get stuff out.  She denies known sick contact. She endorses fever, tmax of 102. She took extra strength tylenol. She endorses dysuria, started about 4 days ago.   She endorses current sexual activity, last time was two weeks ago. She denies nausea, vomiting, chest pain.   Social history: She is  retired and formerly worked in Medical sales representative. She formerly smoked, quitting two months ago. At her peak, she smoked 1/2 ppd. She denies etoh, recreational drug use.   ROS: Constitutional: no weight change, + fever ENT/Mouth: no sore throat, no rhinorrhea Eyes: no eye pain, no vision changes Cardiovascular: no chest pain, + dyspnea,  no edema, no palpitations Respiratory: + cough, no sputum, no wheezing Gastrointestinal: no nausea, no vomiting, no diarrhea, no constipation Genitourinary: no urinary incontinence, no dysuria, no hematuria Musculoskeletal: no arthralgias, no myalgias Skin: no skin lesions, no pruritus, Neuro: + weakness, no loss of consciousness, no syncope Psych: no anxiety, no depression, + decrease appetite Heme/Lymph: no bruising, no bleeding  ED Course: Discussed with emergency medicine provider, patient requiring hospitalization for chief concerns of acute hypoxic respiratory failure patient meeting SIRS criteria.  Assessment/Plan  Principal Problem:   Acute hypoxemic respiratory failure (HCC) Active Problems:   Hypertension   Hyperparathyroidism, primary (HCC)   Tobacco use disorder   S/P total hip arthroplasty   Restless legs syndrome   COPD (chronic obstructive pulmonary disease) (HCC)   Acute pulmonary embolism with acute cor pulmonale (HCC)   Obesity, Class III, BMI 40-49.9 (morbid obesity) ([place])   Leukocytosis   SIRS due to infectious process with organ dysfunction (HCC)   Assessment and Plan:  * Acute hypoxemic respiratory failure ([place]) - Per EDP signout, patient had a trial of 2 L nasal cannula removal and patient desatted to 87% on room air, 2 L nasal cannula was immediately replaced - CTA of the chest per EDP was read negative for PE or dissection - Etiology workup in  progress - Check procalcitonin  SIRS due to infectious process with organ dysfunction [facility]) - Patient had fever of 100.9, leukocytosis, source of UTI, and organ involvement is  respiratory failure requiring 2 L nasal cannula - Blood cultures, x2 are in process - Urine cutlure in process - Cefepime, azithromycin, vancomycin  Leukocytosis - Presumed secondary to UTI  Obesity, Class III, BMI 40-49.9 (morbid obesity) ([place]) - This complicates overall care and prognosis.   Acute pulmonary embolism with acute cor pulmonale (HCC) - History of acute moderate PE on right middle lobe, with CT evidence of right heart strain on 05/19/2022 - Patient endorses compliance with anticoagulation - Eliquis 5 mg p.o. twice daily resumed  Hypertension - Hydralazine 5 mg IV every 8 hours as needed for SBP greater than 175, 4 days ordered  Chart reviewed.   DVT prophylaxis:  Code Status: full code Diet: Heart healthy Family Communication: No Disposition Plan: Pending clinical course Consults called: Clinic  Admission status: Telemetry cardiac, inpatient  Past Medical History:  Diagnosis Date  . Anemia    takes ferrous sulfate daily  . Anxiety   . Arthritis   . Bladder spasm   . Chronic back pain    spinal stenosis  . Colon cancer ([place])   . Complication of anesthesia    B/P bottoms out, hard to wake up   . Contact dermatitis   . COPD (chronic obstructive pulmonary disease) (HCC)    Albuterol inhaler and DuoNeb as needed  . COPD (chronic obstructive pulmonary disease) ([place])   . Coronary artery disease    mild by 08/25/09 cath  . Foot drop, left   . Headache   . Heart murmur    never had any problems  . History of blood transfusion    no abnormal reaction noted  . History of bronchitis   . History of MRSA infection 6+ yrs ago  . Hypertension    takes Metoprolol daily  . Hypertension   . Insomnia    takes Trazodone nightly  . Joint pain   . Joint swelling   . Lower extremity edema   . Nocturia   . Panic attacks    takes Effexor daily  . Peripheral edema    takes Lasix daily and takes Bumex every 3 days.Takes Aldactone daily   . Pulmonary emboli ([place])  2018  . Restless leg syndrome    takes Requip daily  . RLS (restless legs syndrome)   . Sleep apnea    doesnt use CPAP   Past Surgical History:  Procedure Laterality Date  . ABDOMINAL HYSTERECTOMY    . BACK SURGERY    . BREAST EXCISIONAL BIOPSY Left 1980's   neg  . CATARACT EXTRACTION W/PHACO Right 10/09/2021   Procedure: CATARACT EXTRACTION PHACO AND INTRAOCULAR LENS PLACEMENT (IOC) RIGHT 23.94 01:52.0;  Surgeon: Birder Robson, MD;  Location: [place];  Service: Ophthalmology;  Laterality: Right;  . CATARACT EXTRACTION W/PHACO Left 10/23/2021   Procedure: CATARACT EXTRACTION PHACO AND INTRAOCULAR LENS PLACEMENT (IOC) LEFT 7.34 00:47.4;  Surgeon: Birder Robson, MD;  Location: [place];  Service: Ophthalmology;  Laterality: Left;  . CHOLECYSTECTOMY    . COLON SURGERY     partial colectomy d/t cancer cells  . COLONOSCOPY    . HIP CLOSED REDUCTION Left 12/11/2020   Procedure: CLOSED REDUCTION HIP;  Surgeon: Hessie Knows, MD;  Location: [facility];  Service: Orthopedics;  Laterality: Left;  . I&D of left hip      x 4  .  INTERSTIM IMPLANT PLACEMENT N/A 03/08/2019   Procedure: Barrie Lyme IMPLANT FIRST AND SECOND STAGE WITH IMPEDENCE CHECK;  Surgeon: Bjorn Loser, MD;  Location: [facility];  Service: Urology;  Laterality: N/A;  . JOINT REPLACEMENT    . PULMONARY THROMBECTOMY Right 05/20/2022   Procedure: PULMONARY THROMBECTOMY;  Surgeon: Algernon Huxley, MD;  Location: [place] [facility];  Service: Cardiovascular;  Laterality: Right;  Bilateral subsegmental  . Resection total hip    . Revision of total hip arthroplasty     x3-4  . right hip replacement    . TOTAL HIP ARTHROPLASTY Left    Social History:  reports that she has quit smoking. Her smoking use included cigarettes. She has never used smokeless tobacco. She reports that she does not drink alcohol and does not use drugs.  Allergies  Allergen Reactions  . Hydrocodone-Chlorpheniramine Shortness Of Breath   . Sulfa Antibiotics Shortness Of Breath  . Sulfa Antibiotics Shortness Of Breath  . Chlorhexidine Rash  . Methadone Other (See Comments)    Altered Mental Status  . Benadryl [Diphenhydramine] Anxiety  . Latex Rash  . Tape Rash    "the white kind they use after surgery"   Family History  Problem Relation Age of Onset  . Cancer Mother   . Stroke Father   . Diabetes Sister   . Mental illness Sister    Family history: Family history reviewed and not pertinent.  Prior to Admission medications   Medication Sig Start Date End Date Taking? Authorizing Provider  acetaminophen (TYLENOL) 500 MG tablet Take 1,000 mg by mouth every 6 (six) hours as needed for moderate pain or headache. Patient not taking: Reported on 06/20/2022    [provider]  albuterol (PROVENTIL HFA;VENTOLIN HFA) 108 (90 Base) MCG/ACT inhaler Inhale 2 puffs into the lungs every 4 (four) hours as needed for wheezing or shortness of breath. Patient not taking: Reported on 06/20/2022 12/05/15   Loletha Grayer, MD  apixaban (ELIQUIS) 5 MG TABS tablet Take 2 tablets (10 mg total) by mouth 2 (two) times daily for 4 days. 05/23/22 05/27/22  Wyvonnia Dusky, MD  apixaban (ELIQUIS) 5 MG TABS tablet Take 1 tablet (5 mg total) by mouth 2 (two) times daily. Only start this script after completing eliquis '10mg'$  BID x 4 days more 05/28/22 06/27/22  Wyvonnia Dusky, MD  ARIPiprazole (ABILIFY) 5 MG tablet Take 5 mg by mouth at bedtime.    [provider]  aspirin 81 MG chewable tablet Chew by mouth daily.    [provider]  buPROPion (WELLBUTRIN) 75 MG tablet Take 75 mg by mouth 2 (two) times daily. Unsure of dose Patient not taking: Reported on 06/20/2022    [provider]  cyclobenzaprine (FLEXERIL) 10 MG tablet Take 10 mg by mouth 3 (three) times daily. 02/15/22   [provider]  etodolac (LODINE) 400 MG tablet Take 400 mg by mouth 2 (two) times daily. Patient not taking: Reported  on 06/20/2022    [provider]  fluticasone (FLONASE) 50 MCG/ACT nasal spray Place 1 spray into both nostrils daily as needed (for sinus issues).  Patient not taking: Reported on 06/20/2022    [provider]  fluticasone-salmeterol (ADVAIR) 250-50 MCG/ACT AEPB Inhale into the lungs. 01/11/22 01/11/23  [provider]  furosemide (LASIX) 20 MG tablet Take 1 tablet (20 mg total) by mouth daily. Patient not taking: Reported on 06/20/2022 04/29/16 02/28/26  Lavonia Drafts, MD  gabapentin (NEURONTIN) 100 MG capsule Take 200 mg  by mouth at bedtime. Patient not taking: Reported on 12/11/2020 02/15/18   [provider]  ipratropium-albuterol (DUONEB) 0.5-2.5 (3) MG/3ML SOLN Take 3 mLs by nebulization every 6 (six) hours as needed (for wheezing/shortness of breath). Patient not taking: Reported on 06/20/2022    [provider]  metoprolol succinate (TOPROL-XL) 50 MG 24 hr tablet Take 1 tablet (50 mg total) by mouth every evening. Take with or immediately following a meal. Patient not taking: Reported on 06/20/2022 12/26/15   [name], MD  metoprolol succinate (TOPROL-XL) 50 MG 24 hr tablet Take 50 mg by mouth daily. 05/08/22   [provider]  montelukast (SINGULAIR) 10 MG tablet Take 10 mg by mouth daily. 11/16/20   [provider]  montelukast (SINGULAIR) 10 MG tablet Take 10 mg by mouth daily. 04/30/22   [provider]  Multiple Vitamin (MULTIVITAMIN WITH MINERALS) TABS tablet Take 2 tablets by mouth daily. Patient not taking: Reported on 06/20/2022    [provider]  nystatin (MYCOSTATIN) 100000 UNIT/ML suspension Take 5 mLs by mouth daily at 12 noon. Patient not taking: Reported on 06/20/2022 11/20/20   [provider]  omeprazole (PRILOSEC) 40 MG capsule Take 40 mg by mouth daily. 04/02/22   [provider]  oxybutynin (DITROPAN) 5 MG tablet Take 5 mg by mouth 2 (two) times daily. 03/29/22   [provider]  PARoxetine (PAXIL) 40 MG tablet Take 40 mg by mouth daily at 2 PM.     [provider]  PARoxetine (PAXIL) 40 MG tablet Take 40 mg by mouth daily. Patient not taking: Reported on 06/20/2022 04/30/22   [provider]  rOPINIRole (REQUIP) 3 MG tablet Take 1 tablet (3 mg total) by mouth 4 (four) times daily. 04/20/15   [name], MD  spironolactone (ALDACTONE) 25 MG tablet Take 25 mg by mouth daily at 2 PM.     [provider]  spironolactone (ALDACTONE) 25 MG tablet Take 25 mg by mouth daily. 04/18/22   [provider]  temazepam (RESTORIL) 15 MG capsule Take 15 mg by mouth at bedtime as needed for sleep. Patient not taking: Reported on 10/01/2021 10/04/20   [provider]  temazepam (RESTORIL) 30 MG capsule Take 30 mg by mouth at bedtime as needed. Patient not taking: Reported on 06/20/2022 05/17/22   [provider]  TRELEGY ELLIPTA 200-62.5-25 MCG/INH AEPB Inhale 1 puff into the lungs daily. 11/17/20   [provider]  Grant Ruts INHUB 250-50 MCG/ACT AEPB Inhale 1 puff into the lungs in the morning and at bedtime. Patient not taking: Reported on 06/20/2022 12/05/21   [provider]   Physical Exam: Vitals:   08/12/22 1830 08/12/22 1900 08/12/22 1930 08/12/22 2238  BP: (!) 98/51 96/78 121/82 98/60  Pulse: 84 81 75 63  Resp: 20 19 (!) 22 18  Temp:    98.1 F (36.7 C)  TempSrc:      SpO2: 91% 91% 94% 95%  Weight:      Height:       Constitutional: appears ***, NAD, calm, comfortable Eyes: PERRL, lids and conjunctivae normal ENMT: Mucous membranes are moist. Posterior pharynx clear of any exudate or lesions. Age-appropriate dentition. Hearing appropriate/loss*** Neck: normal, supple, no masses, no thyromegaly Respiratory: clear to auscultation bilaterally, no wheezing, no crackles. Normal respiratory effort. No accessory muscle use.  Cardiovascular: Regular rate and rhythm, no murmurs / rubs /  gallops. No extremity edema. 2+ pedal pulses. No carotid bruits.  Abdomen: no tenderness, no masses palpated, no hepatosplenomegaly. Bowel sounds positive.  Musculoskeletal: no clubbing / cyanosis. No joint deformity upper and lower extremities. Good ROM, no contractures, no atrophy. Normal muscle tone.  Skin: no rashes, lesions, ulcers. No induration Neurologic: Sensation intact. Strength 5/5 in all 4.  Psychiatric: Normal judgment and insight. Alert and oriented x 3. Normal mood.   EKG: independently reviewed, showing sinus rhythm with rate of 88, QTc 452  Chest x-ray on Admission: I personally reviewed and I agree with radiologist reading as below.  CT Angio Chest PE W and/or Wo Contrast  Result Date: 08/12/2022 CLINICAL DATA:  Fever and altered mental status EXAM: CT ANGIOGRAPHY CHEST WITH CONTRAST TECHNIQUE: Multidetector CT imaging of the chest was performed using the standard protocol during bolus administration of intravenous contrast. Multiplanar CT image reconstructions and MIPs were obtained to evaluate the vascular anatomy. RADIATION DOSE REDUCTION: This exam was performed according to the departmental dose-optimization program which includes automated exposure control, adjustment of the mA and/or kV according to patient size and/or use of iterative reconstruction technique. CONTRAST:  75 mL OMNIPAQUE IOHEXOL 350 MG/ML SOLN COMPARISON:  None Available. 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 Descending thoracic aorta is ectatic, 3 cm diameter. No filling defects in pulmonary arteries to indicate PE. No aortic dissection. No pericardial effusion. Mild cardiomegaly. Central pulmonary arteries are prominent consistent with pulmonary arterial hypertension. Mediastinum/Nodes: No enlarged mediastinal, hilar, or axillary lymph nodes. Thyroid gland, trachea appear unremarkable. There may be a an esophageal Zenker's evaluated further non emergently  with endoscopy or esophagram. Lungs/Pleura: Fibrotic changes identified as well as emphysematous scarring. There is dependent bibasilar subsegmental atelectasis superimposed on chronic scarring. No pleural effusion or pneumothorax identified. Upper Abdomen: Left kidney appears prominent only partially imaged with suggestion of perinephric fat stranding which may be due to pyelonephritis. This examination did not include the right kidney. Musculoskeletal: No chest wall abnormality. No acute or significant osseous findings. There are thoracic degenerative changes. Review of the MIP images confirms the above findings. IMPRESSION: 1. No evidence of PE or dissection. 2. 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 3. Mild cardiomegaly. 4. 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 5. Pulmonary fibrosis and emphysematous scarring. 6. Findings in the partially imaged left kidney that could indicate pyelonephritis. Clinical correlation recommended. Electronically Signed   By: Sammie Bench M.D.   On: 08/12/2022 18:05   DG Chest Port 1 View  Result Date: 08/12/2022 CLINICAL DATA:  Fever and altered mental status. Questionable sepsis. EXAM: PORTABLE CHEST 1 VIEW COMPARISON:  Chest x-ray dated May 19, 2022. FINDINGS: Are remains at the upper limits of normal in size. Chronically coarsened interstitial markings are similar to prior studies. No focal consolidation, pleural effusion, or pneumothorax. No acute osseous abnormality. IMPRESSION: No active disease. Electronically Signed   By: Titus Dubin M.D.   On: 08/12/2022 16:55    Labs on Admission: I have personally reviewed following labs  CBC: Recent Labs  Lab 08/12/22 1613  WBC 13.8*  NEUTROABS 12.4*  HGB 13.4  HCT 40.9  MCV 93.4  PLT 376   Basic Metabolic Panel: Recent Labs  Lab 08/12/22 1613  NA 135  K 3.6  CL 101  CO2 24  GLUCOSE 111*  BUN 21  CREATININE 1.06*  CALCIUM 10.4*   GFR: Estimated Creatinine Clearance: 42  mL/min (A) (by C-G formula based on SCr of 1.06 mg/dL (H)).  Liver Function Tests: Recent Labs  Lab 08/12/22 [phone]  AST 21  ALT 19  ALKPHOS 108  BILITOT 0.7  PROT 7.3  ALBUMIN 3.1*   Coagulation Profile: Recent Labs  Lab 08/12/22 1613  INR 1.5*   Urine analysis:    Component Value Date/Time   COLORURINE YELLOW (A) 08/12/2022 1613   APPEARANCEUR CLOUDY (A) 08/12/2022 1613   APPEARANCEUR Cloudy (A) 06/28/2019 1558   LABSPEC 1.013 08/12/2022 1613   LABSPEC 1.009 03/15/2012 2143   PHURINE 6.0 08/12/2022 1613   GLUCOSEU NEGATIVE 08/12/2022 1613   GLUCOSEU Negative 03/15/2012 2143   HGBUR MODERATE (A) 08/12/2022 1613   BILIRUBINUR NEGATIVE 08/12/2022 1613   BILIRUBINUR Negative 06/28/2019 1558   BILIRUBINUR Negative 03/15/2012 2143   KETONESUR NEGATIVE 08/12/2022 1613   PROTEINUR 100 (A) 08/12/2022 1613   NITRITE POSITIVE (A) 08/12/2022 1613   LEUKOCYTESUR LARGE (A) 08/12/2022 1613   LEUKOCYTESUR 1+ 03/15/2012 2143   This document was prepared using Dragon Voice Recognition software and may include unintentional dictation errors.  Dr. Tobie Poet [facility]  If 7PM-7AM, please contact overnight-coverage provider If 7AM-7PM, please contact day coverage provider [email or web address]  08/13/2022, 12:01 AM

## 2022-08-12 NOTE — Consult Note (Signed)
PHARMACY -  BRIEF ANTIBIOTIC NOTE   Pharmacy has received consult(s) for Vancomycin and Cefepime from an ED provider.  The patient's profile has been reviewed for ht/wt/allergies/indication/available labs.    One time order(s) placed for Vancomycin 1g IV and Cefepime 2g IV x 1 dose each.  Further antibiotics/pharmacy consults should be ordered by admitting physician if indicated.                       Thank you, Pearla Dubonnet 08/12/2022  4:06 PM

## 2022-08-12 NOTE — ED Triage Notes (Addendum)
Today has fever and AMS per husband. Patient had appointment with PCP but didn't go. Patient reports burning with urination.

## 2022-08-12 NOTE — Assessment & Plan Note (Signed)
-   Presumed secondary to UTI

## 2022-08-12 NOTE — Consult Note (Signed)
Pharmacy Antibiotic Note  Ann Silva is a 77 y.o. female with PMH including HTN, GERD, history of PE on apixaban, COPD, lumbar spinal stenosis, morbid obesity, bipolar affective disorder admitted on 08/12/2022 with  fever / AMS / burning with urination . Pharmacy has been consulted for vancomycin and cefepime dosing. Patient is also ordered azithromycin.  Plan:  Cefepime 2 g IV q12h  Vancomycin 1.5 g IV LD (1000 mg + 500 mg) followed by maintenance regimen of vancomycin 1.25 g IV q48h --Calculated AUC: 502, Cmin 10.3 --Daily Scr per protocol --Levels at steady state or as clinically indicated  Height: '4\' 10"'$  (147.3 cm) Weight: 88.2 kg (194 lb 7.1 oz) IBW/kg (Calculated) : 40.9  Temp (24hrs), Avg:99.6 F (37.6 C), Min:98.2 F (36.8 C), Max:100.9 F (38.3 C)  Recent Labs  Lab 08/12/22 1613 08/12/22 2015  WBC 13.8*  --   CREATININE 1.06*  --   LATICACIDVEN 1.2 1.8    Estimated Creatinine Clearance: 42 mL/min (A) (by C-G formula based on SCr of 1.06 mg/dL (H)).    Allergies  Allergen Reactions   Hydrocodone-Chlorpheniramine Shortness Of Breath   Sulfa Antibiotics Shortness Of Breath   Sulfa Antibiotics Shortness Of Breath   Chlorhexidine Rash   Methadone Other (See Comments)    Altered Mental Status   Benadryl [Diphenhydramine] Anxiety   Latex Rash   Tape Rash    "the white kind they use after surgery"    Antimicrobials this admission: Metronidazole 2/5 x 1 Cefepime 2/5 >>  Vancomycin 2/5 >>  Azithromycin 2/5 >>   Dose adjustments this admission: N/A  Microbiology results: 2/5 BCx: pending 2/5 UCx: pending  2/5 MRSA PCR: pending  Thank you for allowing pharmacy to be a part of this patient's care.  Benita Gutter 08/12/2022 9:50 PM

## 2022-08-12 NOTE — Assessment & Plan Note (Addendum)
Most likely secondary to sepsis. - CTA of the chest per EDP was read negative for PE or dissection. - Etiology workup in progress

## 2022-08-12 NOTE — Assessment & Plan Note (Signed)
Blood pressure within goal. -Restarting home metoprolol -Keep holding Lasix, spironolactone - Hydralazine 5 mg IV every 8 hours as needed for SBP greater than 175, 4 days ordered

## 2022-08-12 NOTE — ED Notes (Signed)
Pt ate 25% of her supper.

## 2022-08-12 NOTE — H&P (Incomplete)
History and Physical   KEI LANGHORST BDZ:[id number] DOB: Dec 06, 1945 DOA: 08/12/2022  PCP: Rusty Aus, MD  Patient coming from: home   I have personally briefly reviewed patient's old medical records in [place].  Chief Concern: altered mental status, fever  HPI: Ms. Ann Silva is a 77 year old female with history of hypertension, GERD, history of acute PE and status post lobectomy in 05/20/2022, on Eliquis, COPD, spinal stenosis of the lumbar region, B12 deficiency, aortic atherosclerosis, morbid obesity, bipolar affective disorder, who presents emergency department for chief concerns of altered mental status and fever.  Initial vitals in the ED showed temperature of 100.9, respiration rate of 20, heart rate of 85, blood pressure 108/61, SpO2 of 90% on 2 L nasal cannula.  Of note in the ED, ED team tested 2 L oxygen removal and patient desatted to 87% on room air.  Patient's nasal cannula was replaced back to the nares immediately.  Serum sodium is 135, potassium 3.6, chloride 101, bicarb 24, BUN of 21, serum creatinine of 1.06, EGFR 54, nonfasting blood glucose 111, WBC 13.8, hemoglobin 13.4, platelets of 276.  COVID/influenza A/influenza B/RSV PCR were negative.  ED treatment: Acetaminophen 650 mg p.o. one-time dose, DuoNebs x 1, cefepime, LR 1 L bolus, vancomycin, metronidazole. ----------------- At bedside, she is able to me her name, age, current location, and currently calendar year.  She reports difficulty breathing, that started about several days ago. She is uncertain of exact date. She denies known sick contact. She endorses fever, tmax of 102. She took extra strength tylenol. She endorses dysuria, started about 4 days ago.   She endorses current sexual activity, last time was two weeks ago. She denies nausea, vomiting, chest pain.   Social history: She is retired and formerly worked in Medical sales representative. She formerly smoked, quitting two months ago. At her peak, she  smoked 1/2 ppd. She denies etoh, recreational drug use.   ROS:*** Constitutional: no weight change, no fever ENT/Mouth: no sore throat, no rhinorrhea Eyes: no eye pain, no vision changes Cardiovascular: no chest pain, no dyspnea,  no edema, no palpitations Respiratory: no cough, no sputum, no wheezing Gastrointestinal: no nausea, no vomiting, no diarrhea, no constipation Genitourinary: no urinary incontinence, no dysuria, no hematuria Musculoskeletal: no arthralgias, no myalgias Skin: no skin lesions, no pruritus, Neuro: + weakness, no loss of consciousness, no syncope Psych: no anxiety, no depression, + decrease appetite Heme/Lymph: no bruising, no bleeding  ED Course: Discussed with emergency medicine provider, patient requiring hospitalization for chief concerns of acute hypoxic respiratory failure patient meeting SIRS criteria.  Assessment/Plan  Principal Problem:   Acute hypoxemic respiratory failure (HCC) Active Problems:   Hypertension   Hyperparathyroidism, primary (HCC)   Tobacco use disorder   S/P total hip arthroplasty   Restless legs syndrome   COPD (chronic obstructive pulmonary disease) (HCC)   Acute pulmonary embolism with acute cor pulmonale (HCC)   Obesity, Class III, BMI 40-49.9 (morbid obesity) ([place])   Leukocytosis   SIRS due to infectious process with organ dysfunction (HCC)   Assessment and Plan:  * Acute hypoxemic respiratory failure ([place]) - Per EDP signout, patient had a trial of 2 L nasal cannula removal and patient desatted to 87% on room air, 2 L nasal cannula was immediately replaced - CTA of the chest per EDP was read negative for PE or dissection - Etiology workup in progress - Check procalcitonin  SIRS due to infectious process with organ dysfunction [facility]) - Patient had fever of  100.9, leukocytosis, source of UTI, and organ involvement is respiratory failure requiring 2 L nasal cannula - Blood cultures, x2 are in process - Urine cutlure in  process  Leukocytosis - Presumed secondary to UTI  Obesity, Class III, BMI 40-49.9 (morbid obesity) ([place]) - This complicates overall care and prognosis.   Acute pulmonary embolism with acute cor pulmonale (HCC) - History of acute moderate PE on right middle lobe, with CT evidence of right heart strain on 05/19/2022  Hypertension - Hydralazine 5 mg IV every 8 hours as needed for SBP greater than 175, 4 days ordered      *** Chart reviewed.   DVT prophylaxis: ***  Code Status: full code Diet: *** Family Communication: ***  Disposition Plan: ***  Consults called: ***  Admission status: Telemetry cardiac, inpatient  Past Medical History:  Diagnosis Date   Anemia    takes ferrous sulfate daily   Anxiety    Arthritis    Bladder spasm    Chronic back pain    spinal stenosis   Colon cancer (HCC)    Complication of anesthesia    B/P bottoms out, hard to wake up    Contact dermatitis    COPD (chronic obstructive pulmonary disease) (HCC)    Albuterol inhaler and DuoNeb as needed   COPD (chronic obstructive pulmonary disease) (HCC)    Coronary artery disease    mild by 08/25/09 cath   Foot drop, left    Headache    Heart murmur    never had any problems   History of blood transfusion    no abnormal reaction noted   History of bronchitis    History of MRSA infection 6+ yrs ago   Hypertension    takes Metoprolol daily   Hypertension    Insomnia    takes Trazodone nightly   Joint pain    Joint swelling    Lower extremity edema    Nocturia    Panic attacks    takes Effexor daily   Peripheral edema    takes Lasix daily and takes Bumex every 3 days.Takes Aldactone daily    Pulmonary emboli ([place]) 2018   Restless leg syndrome    takes Requip daily   RLS (restless legs syndrome)    Sleep apnea    doesnt use CPAP   Past Surgical History:  Procedure Laterality Date   ABDOMINAL HYSTERECTOMY     BACK SURGERY     BREAST EXCISIONAL BIOPSY Left 1980's   neg    CATARACT EXTRACTION W/PHACO Right 10/09/2021   Procedure: CATARACT EXTRACTION PHACO AND INTRAOCULAR LENS PLACEMENT (IOC) RIGHT 23.94 01:52.0;  Surgeon: Birder Robson, MD;  Location: [facility];  Service: Ophthalmology;  Laterality: Right;   CATARACT EXTRACTION W/PHACO Left 10/23/2021   Procedure: CATARACT EXTRACTION PHACO AND INTRAOCULAR LENS PLACEMENT (IOC) LEFT 7.34 00:47.4;  Surgeon: Birder Robson, MD;  Location: [place];  Service: Ophthalmology;  Laterality: Left;   CHOLECYSTECTOMY     COLON SURGERY     partial colectomy d/t cancer cells   COLONOSCOPY     HIP CLOSED REDUCTION Left 12/11/2020   Procedure: CLOSED REDUCTION HIP;  Surgeon: Hessie Knows, MD;  Location: [facility];  Service: Orthopedics;  Laterality: Left;   I&D of left hip      x 4   INTERSTIM IMPLANT PLACEMENT N/A 03/08/2019   Procedure: Barrie Lyme IMPLANT FIRST AND SECOND STAGE WITH IMPEDENCE CHECK;  Surgeon: Bjorn Loser, MD;  Location: [facility];  Service: Urology;  Laterality:  N/A;   JOINT REPLACEMENT     PULMONARY THROMBECTOMY Right 05/20/2022   Procedure: PULMONARY THROMBECTOMY;  Surgeon: Algernon Huxley, MD;  Location: [facility];  Service: Cardiovascular;  Laterality: Right;  Bilateral subsegmental   Resection total hip     Revision of total hip arthroplasty     x3-4   right hip replacement     TOTAL HIP ARTHROPLASTY Left    Social History:  reports that she has quit smoking. Her smoking use included cigarettes. She has never used smokeless tobacco. She reports that she does not drink alcohol and does not use drugs.  Allergies  Allergen Reactions   Hydrocodone-Chlorpheniramine Shortness Of Breath   Sulfa Antibiotics Shortness Of Breath   Sulfa Antibiotics Shortness Of Breath   Chlorhexidine Rash   Methadone Other (See Comments)    Altered Mental Status   Benadryl [Diphenhydramine] Anxiety   Latex Rash   Tape Rash    "the white kind they use after surgery"   Family History   Problem Relation Age of Onset   Cancer Mother    Stroke Father    Diabetes Sister    Mental illness Sister    Family history: Family history reviewed and not pertinent.  Prior to Admission medications   Medication Sig Start Date End Date Taking? Authorizing Provider  acetaminophen (TYLENOL) 500 MG tablet Take 1,000 mg by mouth every 6 (six) hours as needed for moderate pain or headache. Patient not taking: Reported on 06/20/2022    [provider]  albuterol (PROVENTIL HFA;VENTOLIN HFA) 108 (90 Base) MCG/ACT inhaler Inhale 2 puffs into the lungs every 4 (four) hours as needed for wheezing or shortness of breath. Patient not taking: Reported on 06/20/2022 12/05/15   Loletha Grayer, MD  apixaban (ELIQUIS) 5 MG TABS tablet Take 2 tablets (10 mg total) by mouth 2 (two) times daily for 4 days. 05/23/22 05/27/22  Wyvonnia Dusky, MD  apixaban (ELIQUIS) 5 MG TABS tablet Take 1 tablet (5 mg total) by mouth 2 (two) times daily. Only start this script after completing eliquis '10mg'$  BID x 4 days more 05/28/22 06/27/22  Wyvonnia Dusky, MD  ARIPiprazole (ABILIFY) 5 MG tablet Take 5 mg by mouth at bedtime.    [provider]  aspirin 81 MG chewable tablet Chew by mouth daily.    [provider]  buPROPion (WELLBUTRIN) 75 MG tablet Take 75 mg by mouth 2 (two) times daily. Unsure of dose Patient not taking: Reported on 06/20/2022    [provider]  cyclobenzaprine (FLEXERIL) 10 MG tablet Take 10 mg by mouth 3 (three) times daily. 02/15/22   [provider]  etodolac (LODINE) 400 MG tablet Take 400 mg by mouth 2 (two) times daily. Patient not taking: Reported on 06/20/2022    [provider]  fluticasone (FLONASE) 50 MCG/ACT nasal spray Place 1 spray into both nostrils daily as needed (for sinus issues).  Patient not taking: Reported on 06/20/2022    [provider]  fluticasone-salmeterol (ADVAIR) 250-50 MCG/ACT AEPB Inhale into the  lungs. 01/11/22 01/11/23  [provider]  furosemide (LASIX) 20 MG tablet Take 1 tablet (20 mg total) by mouth daily. Patient not taking: Reported on 06/20/2022 04/29/16 02/28/26  Lavonia Drafts, MD  gabapentin (NEURONTIN) 100 MG capsule Take 200 mg by mouth at bedtime. Patient not taking: Reported on 12/11/2020 02/15/18   [provider]  ipratropium-albuterol (DUONEB) 0.5-2.5 (3) MG/3ML SOLN Take 3 mLs by nebulization every 6 (six)  hours as needed (for wheezing/shortness of breath). Patient not taking: Reported on 06/20/2022    [provider]  metoprolol succinate (TOPROL-XL) 50 MG 24 hr tablet Take 1 tablet (50 mg total) by mouth every evening. Take with or immediately following a meal. Patient not taking: Reported on 06/20/2022 12/26/15   [name], MD  metoprolol succinate (TOPROL-XL) 50 MG 24 hr tablet Take 50 mg by mouth daily. 05/08/22   [provider]  montelukast (SINGULAIR) 10 MG tablet Take 10 mg by mouth daily. 11/16/20   [provider]  montelukast (SINGULAIR) 10 MG tablet Take 10 mg by mouth daily. 04/30/22   [provider]  Multiple Vitamin (MULTIVITAMIN WITH MINERALS) TABS tablet Take 2 tablets by mouth daily. Patient not taking: Reported on 06/20/2022    [provider]  nystatin (MYCOSTATIN) 100000 UNIT/ML suspension Take 5 mLs by mouth daily at 12 noon. Patient not taking: Reported on 06/20/2022 11/20/20   [provider]  omeprazole (PRILOSEC) 40 MG capsule Take 40 mg by mouth daily. 04/02/22   [provider]  oxybutynin (DITROPAN) 5 MG tablet Take 5 mg by mouth 2 (two) times daily. 03/29/22   [provider]  PARoxetine (PAXIL) 40 MG tablet Take 40 mg by mouth daily at 2 PM.     [provider]  PARoxetine (PAXIL) 40 MG tablet Take 40 mg by mouth daily. Patient not taking: Reported on 06/20/2022 04/30/22   [provider]  rOPINIRole (REQUIP) 3 MG tablet Take 1 tablet  (3 mg total) by mouth 4 (four) times daily. 04/20/15   [name], MD  spironolactone (ALDACTONE) 25 MG tablet Take 25 mg by mouth daily at 2 PM.     [provider]  spironolactone (ALDACTONE) 25 MG tablet Take 25 mg by mouth daily. 04/18/22   [provider]  temazepam (RESTORIL) 15 MG capsule Take 15 mg by mouth at bedtime as needed for sleep. Patient not taking: Reported on 10/01/2021 10/04/20   [provider]  temazepam (RESTORIL) 30 MG capsule Take 30 mg by mouth at bedtime as needed. Patient not taking: Reported on 06/20/2022 05/17/22   [provider]  TRELEGY ELLIPTA 200-62.5-25 MCG/INH AEPB Inhale 1 puff into the lungs daily. 11/17/20   [provider]  Grant Ruts INHUB 250-50 MCG/ACT AEPB Inhale 1 puff into the lungs in the morning and at bedtime. Patient not taking: Reported on 06/20/2022 12/05/21   [provider]   Physical Exam: Vitals:   08/12/22 1830 08/12/22 1900 08/12/22 1930 08/12/22 2238  BP: (!) 98/51 96/78 121/82 98/60  Pulse: 84 81 75 63  Resp: 20 19 (!) 22 18  Temp:    98.1 F (36.7 C)  TempSrc:      SpO2: 91% 91% 94% 95%  Weight:      Height:       Constitutional: appears ***, NAD, calm, comfortable Eyes: PERRL, lids and conjunctivae normal ENMT: Mucous membranes are moist. Posterior pharynx clear of any exudate or lesions. Age-appropriate dentition. Hearing appropriate/loss*** Neck: normal, supple, no masses, no thyromegaly Respiratory: clear to auscultation bilaterally, no wheezing, no crackles. Normal respiratory effort. No accessory muscle use.  Cardiovascular: Regular rate and rhythm, no murmurs / rubs / gallops. No extremity edema. 2+ pedal pulses. No carotid bruits.  Abdomen: no tenderness, no masses palpated, no hepatosplenomegaly. Bowel sounds positive.  Musculoskeletal: no clubbing / cyanosis. No joint deformity upper and lower extremities. Good ROM, no contractures, no atrophy. Normal muscle  tone.   Skin: no rashes, lesions, ulcers. No induration Neurologic: Sensation intact. Strength 5/5 in all 4.  Psychiatric: Normal judgment and insight. Alert and oriented x 3. Normal mood.   EKG: independently reviewed, showing sinus rhythm with rate of 88, QTc 452  Chest x-ray on Admission: I personally reviewed and I agree with radiologist reading as below.  CT Angio Chest PE W and/or Wo Contrast  Result Date: 08/12/2022 CLINICAL DATA:  Fever and altered mental status EXAM: CT ANGIOGRAPHY CHEST WITH CONTRAST TECHNIQUE: Multidetector CT imaging of the chest was performed using the standard protocol during bolus administration of intravenous contrast. Multiplanar CT image reconstructions and MIPs were obtained to evaluate the vascular anatomy. RADIATION DOSE REDUCTION: This exam was performed according to the departmental dose-optimization program which includes automated exposure control, adjustment of the mA and/or kV according to patient size and/or use of iterative reconstruction technique. CONTRAST:  75 mL OMNIPAQUE IOHEXOL 350 MG/ML SOLN COMPARISON:  None Available. 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 Descending thoracic aorta is ectatic, 3 cm diameter. No filling defects in pulmonary arteries to indicate PE. No aortic dissection. No pericardial effusion. Mild cardiomegaly. Central pulmonary arteries are prominent consistent with pulmonary arterial hypertension. Mediastinum/Nodes: No enlarged mediastinal, hilar, or axillary lymph nodes. Thyroid gland, trachea appear unremarkable. There may be a an esophageal Zenker's evaluated further non emergently with endoscopy or esophagram. Lungs/Pleura: Fibrotic changes identified as well as emphysematous scarring. There is dependent bibasilar subsegmental atelectasis superimposed on chronic scarring. No pleural effusion or pneumothorax identified. Upper Abdomen: Left kidney appears prominent only partially imaged  with suggestion of perinephric fat stranding which may be due to pyelonephritis. This examination did not include the right kidney. Musculoskeletal: No chest wall abnormality. No acute or significant osseous findings. There are thoracic degenerative changes. Review of the MIP images confirms the above findings. IMPRESSION: 1. No evidence of PE or dissection. 2. 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 3. Mild cardiomegaly. 4. 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 5. Pulmonary fibrosis and emphysematous scarring. 6. Findings in the partially imaged left kidney that could indicate pyelonephritis. Clinical correlation recommended. Electronically Signed   By: Sammie Bench M.D.   On: 08/12/2022 18:05   DG Chest Port 1 View  Result Date: 08/12/2022 CLINICAL DATA:  Fever and altered mental status. Questionable sepsis. EXAM: PORTABLE CHEST 1 VIEW COMPARISON:  Chest x-ray dated May 19, 2022. FINDINGS: Are remains at the upper limits of normal in size. Chronically coarsened interstitial markings are similar to prior studies. No focal consolidation, pleural effusion, or pneumothorax. No acute osseous abnormality. IMPRESSION: No active disease. Electronically Signed   By: Titus Dubin M.D.   On: 08/12/2022 16:55    Labs on Admission: I have personally reviewed following labs  CBC: Recent Labs  Lab 08/12/22 1613  WBC 13.8*  NEUTROABS 12.4*  HGB 13.4  HCT 40.9  MCV 93.4  PLT 242   Basic Metabolic Panel: Recent Labs  Lab 08/12/22 1613  NA 135  K 3.6  CL 101  CO2 24  GLUCOSE 111*  BUN 21  CREATININE 1.06*  CALCIUM 10.4*   GFR: Estimated Creatinine Clearance: 42 mL/min (A) (by C-G formula based on SCr of 1.06 mg/dL (H)).  Liver Function Tests: Recent Labs  Lab 08/12/22 1613  AST 21  ALT 19  ALKPHOS 108  BILITOT 0.7  PROT 7.3  ALBUMIN 3.1*   Coagulation Profile: Recent Labs  Lab 08/12/22 1613  INR 1.5*  Urine analysis:    Component Value Date/Time    COLORURINE YELLOW (A) 08/12/2022 1613   APPEARANCEUR CLOUDY (A) 08/12/2022 1613   APPEARANCEUR Cloudy (A) 06/28/2019 1558   LABSPEC 1.013 08/12/2022 1613   LABSPEC 1.009 03/15/2012 2143   PHURINE 6.0 08/12/2022 1613   GLUCOSEU NEGATIVE 08/12/2022 1613   GLUCOSEU Negative 03/15/2012 2143   HGBUR MODERATE (A) 08/12/2022 1613   BILIRUBINUR NEGATIVE 08/12/2022 1613   BILIRUBINUR Negative 06/28/2019 1558   BILIRUBINUR Negative 03/15/2012 2143   KETONESUR NEGATIVE 08/12/2022 1613   PROTEINUR 100 (A) 08/12/2022 1613   NITRITE POSITIVE (A) 08/12/2022 1613   LEUKOCYTESUR LARGE (A) 08/12/2022 1613   LEUKOCYTESUR 1+ 03/15/2012 2143   This document was prepared using Dragon Voice Recognition software and may include unintentional dictation errors.  Dr. Tobie Poet [facility]  If 7PM-7AM, please contact overnight-coverage provider If 7AM-7PM, please contact day coverage provider [email or web address]  08/12/2022, 11:14 PM

## 2022-08-12 NOTE — Assessment & Plan Note (Signed)
Estimated body mass index is 39.42 kg/m as calculated from the following:   Height as of this encounter: 4\' 10"  (1.473 m).   Weight as of this encounter: 85.5 kg.   - This complicates overall care and prognosis.

## 2022-08-12 NOTE — Consult Note (Signed)
CODE SEPSIS - PHARMACY COMMUNICATION  **Broad Spectrum Antibiotics should be administered within 1 hour of Sepsis diagnosis**  Time Code Sepsis Called/Page Received: 1602  Antibiotics Ordered: cefepime, vancomycin, flagyl  Time of 1st antibiotic administration: 1621  Additional action taken by pharmacy: none  If necessary, Name of Provider/Nurse Contacted: n/a    Ann Silva ,PharmD Clinical Pharmacist  08/12/2022  4:07 PM

## 2022-08-12 NOTE — ED Notes (Signed)
Pt care taken, A&O x4, has lr and vancomycin hanging. No complaints at this time.

## 2022-08-12 NOTE — Assessment & Plan Note (Addendum)
History of PE on right middle lobe, with CT evidence of right heart strain on 05/19/2022.  Repeat CTA negative for PE - Patient endorses compliance with anticoagulation - Eliquis 5 mg p.o. twice daily resumed

## 2022-08-12 NOTE — Progress Notes (Signed)
Elink following for sepsis protocol. 

## 2022-08-12 NOTE — Hospital Course (Addendum)
Ms. Ann Silva is a 77 year old female with history of hypertension, GERD, history of acute PE and status post lobectomy in 05/20/2022, on Eliquis, COPD, spinal stenosis of the lumbar region, B12 deficiency, aortic atherosclerosis, morbid obesity, bipolar affective disorder, who presents emergency department for chief concerns of altered mental status and fever.  Initial vitals in the ED showed temperature of 100.9, respiration rate of 20, heart rate of 85, blood pressure 108/61, SpO2 of 90% on 2 L nasal cannula.  Of note in the ED, ED team tested 2 L oxygen removal and patient desatted to 87% on room air.  Patient's nasal cannula was replaced back to the nares immediately.  Serum sodium is 135, potassium 3.6, chloride 101, bicarb 24, BUN of 21, serum creatinine of 1.06, EGFR 54, nonfasting blood glucose 111, WBC 13.8, hemoglobin 13.4, platelets of 276. UA positive for protein urea, nitrates and large leukocytes. COVID/influenza A/influenza B/RSV PCR were negative. 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  Stable pulmonary fibrosis and emphysematous scarring.  Partially imaged left kidney with concern of pyelonephritis. ED treatment: Acetaminophen 650 mg p.o. one-time dose, DuoNebs x 1, cefepime, LR 1 L bolus, vancomycin, metronidazole.  Admitted with concern of sepsis secondary to UTI.  2/7: Vital stable, on 3 L of oxygen, no baseline oxygen use.  Procalcitonin started improving with peak at 15.60.  Slight worsening of creatinine to 1.14.  Giving some gentle IV fluid.  Blood cultures negative so far and urine cultures with E. coli-pending susceptibility.  PT was recommending home health services but patient declines.  Will try weaning from oxygen

## 2022-08-12 NOTE — Assessment & Plan Note (Addendum)
-  See above

## 2022-08-12 NOTE — ED Provider Notes (Signed)
[facility] Provider Note    Event Date/Time   First MD Initiated Contact with Patient 08/12/22 1552     (approximate)   History   Fever   HPI  Ann Silva is a 77 y.o. female  here with generalized weakness, fever, shortness of breath. Pt reports that for the past week or so, she has had general decline. She has had worsening shortness of breath, present at rest but worse w/ exertion, as well as general fatigue and loss of appetite. She has also had increasing urinary frequency and burning with subjective fevers over past 24 hours. She has felt weak. She has had difficulty getting around her house 2/2 her SOB.        Physical Exam   Triage Vital Signs: ED Triage Vitals  Enc Vitals Group     BP --      Pulse Rate 08/12/22 1553 85     Resp 08/12/22 1553 20     Temp --      Temp src --      SpO2 08/12/22 1553 90 %     Weight 08/12/22 1550 193 lb 12.6 oz (87.9 kg)     Height 08/12/22 1553 '4\' 10"'$  (1.473 m)     Head Circumference --      Peak Flow --      Pain Score 08/12/22 1553 0     Pain Loc --      Pain Edu? --      Excl. in [place]? --     Most recent vital signs: Vitals:   08/13/22 0000 08/13/22 0100  BP: 101/60 115/88  Pulse: 63 68  Resp: 20 14  Temp:  98.2 F (36.8 C)  SpO2: 99% 96%     General: Awake, no distress.  CV:  Good peripheral perfusion. RRR. Resp:  Normal effort. Lungs with bilateral rales and scant wheezes. Slight tachypnea. Abd:  No distention. No tenderness. Other:  No LE edema.   ED Results / Procedures / Treatments   Labs (all labs ordered are listed, but only abnormal results are displayed) Labs Reviewed  COMPREHENSIVE METABOLIC PANEL - Abnormal; Notable for the following components:      Result Value   Glucose, Bld 111 (*)    Creatinine, Ser 1.06 (*)    Calcium 10.4 (*)    Albumin 3.1 (*)    GFR, Estimated 54 (*)    All other components within normal limits  CBC WITH DIFFERENTIAL/PLATELET -  Abnormal; Notable for the following components:   WBC 13.8 (*)    Neutro Abs 12.4 (*)    Lymphs Abs 0.5 (*)    Abs Immature Granulocytes 0.11 (*)    All other components within normal limits  PROTIME-INR - Abnormal; Notable for the following components:   Prothrombin Time 17.9 (*)    INR 1.5 (*)    All other components within normal limits  URINALYSIS, W/ REFLEX TO CULTURE (INFECTION SUSPECTED) - Abnormal; Notable for the following components:   Color, Urine YELLOW (*)    APPearance CLOUDY (*)    Hgb urine dipstick MODERATE (*)    Protein, ur 100 (*)    Nitrite POSITIVE (*)    Leukocytes,Ua LARGE (*)    Bacteria, UA RARE (*)    All other components within normal limits  RESP PANEL BY RT-PCR (RSV, FLU A&B, COVID)  RVPGX2  MRSA NEXT GEN BY PCR, NASAL  CULTURE, BLOOD (ROUTINE X 2)  CULTURE, BLOOD (ROUTINE  X 2)  URINE CULTURE  LACTIC ACID, PLASMA  APTT  BRAIN NATRIURETIC PEPTIDE  LACTIC ACID, PLASMA  PROCALCITONIN  BASIC METABOLIC PANEL  CBC  PROCALCITONIN     EKG Normal sinus rhythm, VR 88. PR 152, QRS 114, QTc 452. No acute ST elevations or depressions. No ischemia or infarct.   RADIOLOGY CXR: Clear CT Angio chest: No PE, findings in left kidney that could represent Pyelo   I also independently reviewed and agree with radiologist interpretations.   PROCEDURES:  Critical Care performed: Yes, see critical care procedure note(s)  .Critical Care  Performed by: Duffy Bruce, MD Authorized by: Duffy Bruce, MD   Critical care provider statement:    Critical care time (minutes):  30   Critical care time was exclusive of:  Separately billable procedures and treating other patients   Critical care was necessary to treat or prevent imminent or life-threatening deterioration of the following conditions:  Circulatory failure, cardiac failure and sepsis   Critical care was time spent personally by me on the following activities:  Development of treatment plan with  patient or surrogate, discussions with consultants, evaluation of patient's response to treatment, examination of patient, ordering and review of laboratory studies, ordering and review of radiographic studies, ordering and performing treatments and interventions, pulse oximetry, re-evaluation of patient's condition and review of old charts   I assumed direction of critical care for this patient from another provider in my specialty: no     Care discussed with: admitting provider       MEDICATIONS ORDERED IN ED: Medications  acetaminophen (TYLENOL) tablet 650 mg (has no administration in time range)    Or  acetaminophen (TYLENOL) suppository 650 mg (has no administration in time range)  ondansetron (ZOFRAN) tablet 4 mg (has no administration in time range)    Or  ondansetron (ZOFRAN) injection 4 mg (has no administration in time range)  senna-docusate (Senokot-S) tablet 1 tablet (has no administration in time range)  ceFEPIme (MAXIPIME) 2 g in sodium chloride 0.9 % 100 mL IVPB (has no administration in time range)  vancomycin (VANCOREADY) IVPB 1250 mg/250 mL (has no administration in time range)  hydrALAZINE (APRESOLINE) injection 5 mg (has no administration in time range)  azithromycin (ZITHROMAX) 500 mg in sodium chloride 0.9 % 250 mL IVPB (0 mg Intravenous Stopped 08/12/22 2340)  ARIPiprazole (ABILIFY) tablet 5 mg (5 mg Oral Given 08/13/22 0029)  buPROPion (WELLBUTRIN) tablet 75 mg (has no administration in time range)  PARoxetine (PAXIL) tablet 40 mg (has no administration in time range)  temazepam (RESTORIL) capsule 30 mg (has no administration in time range)  pantoprazole (PROTONIX) EC tablet 80 mg (has no administration in time range)  apixaban (ELIQUIS) tablet 5 mg (5 mg Oral Given 08/13/22 0029)  mometasone-formoterol (DULERA) 200-5 MCG/ACT inhaler 2 puff (has no administration in time range)  ipratropium-albuterol (DUONEB) 0.5-2.5 (3) MG/3ML nebulizer solution 3 mL (has no  administration in time range)  montelukast (SINGULAIR) tablet 10 mg (has no administration in time range)  albuterol (PROVENTIL) (2.5 MG/3ML) 0.083% nebulizer solution 2.5 mg (has no administration in time range)  lactated ringers bolus 1,000 mL (0 mLs Intravenous Stopped 08/12/22 1704)  ceFEPIme (MAXIPIME) 2 g in sodium chloride 0.9 % 100 mL IVPB (0 g Intravenous Stopped 08/12/22 1652)  metroNIDAZOLE (FLAGYL) IVPB 500 mg (0 mg Intravenous Stopped 08/12/22 1812)  vancomycin (VANCOCIN) IVPB 1000 mg/200 mL premix (0 mg Intravenous Stopped 08/12/22 1919)  ipratropium-albuterol (DUONEB) 0.5-2.5 (3) MG/3ML nebulizer solution 3 mL (  3 mLs Nebulization Given 08/12/22 1712)  iohexol (OMNIPAQUE) 350 MG/ML injection 75 mL (75 mLs Intravenous Contrast Given 08/12/22 1743)  acetaminophen (TYLENOL) tablet 650 mg (650 mg Oral Given 08/12/22 1731)  lactated ringers bolus 1,000 mL (0 mLs Intravenous Stopped 08/12/22 2245)  vancomycin (VANCOREADY) IVPB 500 mg/100 mL (0 mg Intravenous Stopped 08/12/22 2235)     IMPRESSION / MDM / ASSESSMENT AND PLAN / ED COURSE  I reviewed the triage vital signs and the nursing notes.                              Differential diagnosis includes, but is not limited to, sepsis 2/2 PNA, UTI, COVID-19, viral infection, COPD, CHF, anemia  Patient's presentation is most consistent with acute presentation with potential threat to life or bodily function.  The patient is on the cardiac monitor to evaluate for evidence of arrhythmia and/or significant heart rate changes.  77 yo F here with PMHx HTN, GERD, h/o PE on Eliquis, COPD, here with AMS, weakness. Suspect sepsis 2/2 UTI vs PNA. Empiric ABX started with fluid resuscitation. Labs c/w sepsis with leukocytosis of 13.8. Lytes at baseline. CT Angio obtained 2/2 her hypoxia and shows no PE, no PNA, but does show possible pyelo which I suspect is etiology for her sepsis based on UA. Will admit for sepsis 2/2 pyelo/UTI. Pt monitored and feels better  after IVF, IV ABX.    FINAL CLINICAL IMPRESSION(S) / ED DIAGNOSES   Final diagnoses:  Sepsis secondary to UTI [facility])     Rx / DC Orders   ED Discharge Orders     None        Note:  This document was prepared using Dragon voice recognition software and may include unintentional dictation errors.   Duffy Bruce, MD 08/13/22 0157

## 2022-08-12 NOTE — ED Notes (Signed)
Pt given a warm blanket at this time per pt request.

## 2022-08-13 DIAGNOSIS — N390 Urinary tract infection, site not specified: Secondary | ICD-10-CM

## 2022-08-13 DIAGNOSIS — A419 Sepsis, unspecified organism: Secondary | ICD-10-CM | POA: Diagnosis not present

## 2022-08-13 DIAGNOSIS — D72829 Elevated white blood cell count, unspecified: Secondary | ICD-10-CM

## 2022-08-13 DIAGNOSIS — J9601 Acute respiratory failure with hypoxia: Secondary | ICD-10-CM | POA: Diagnosis not present

## 2022-08-13 LAB — BASIC METABOLIC PANEL
Anion gap: 10 (ref 5–15)
BUN: 17 mg/dL (ref 8–23)
CO2: 23 mmol/L (ref 22–32)
Calcium: 8.5 mg/dL — ABNORMAL LOW (ref 8.9–10.3)
Chloride: 108 mmol/L (ref 98–111)
Creatinine, Ser: 0.75 mg/dL (ref 0.44–1.00)
GFR, Estimated: >60 mL/min (ref >60)
Glucose, Bld: 97 mg/dL (ref 70–99)
Potassium: 3.9 mmol/L (ref 3.5–5.1)
Sodium: 141 mmol/L (ref 135–145)

## 2022-08-13 LAB — CBC
HCT: 38 % (ref 36.0–46.0)
Hemoglobin: 12.2 g/dL (ref 12.0–15.0)
MCH: 31 pg (ref 26.0–34.0)
MCHC: 32.1 g/dL (ref 30.0–36.0)
MCV: 96.4 fL (ref 80.0–100.0)
Platelets: 247 10*3/uL (ref 150–400)
RBC: 3.94 MIL/uL (ref 3.87–5.11)
RDW: 14.5 % (ref 11.5–15.5)
WBC: 11 10*3/uL — ABNORMAL HIGH (ref 4.0–10.5)
nRBC: 0 % (ref 0.0–0.2)

## 2022-08-13 LAB — PROCALCITONIN: Procalcitonin: 15.6 ng/mL

## 2022-08-13 LAB — MRSA NEXT GEN BY PCR, NASAL: MRSA by PCR Next Gen: NOT DETECTED

## 2022-08-13 MED ORDER — GUAIFENESIN-DM 100-10 MG/5ML PO SYRP
5.0000 mL | ORAL_SOLUTION | ORAL | Status: DC | PRN
  Filled 2022-08-13: qty 10

## 2022-08-13 MED ORDER — IBUPROFEN 400 MG PO TABS
600.0000 mg | ORAL_TABLET | Freq: Once | ORAL | Status: AC
  Administered 2022-08-13: 600 mg via ORAL
  Filled 2022-08-13: qty 2

## 2022-08-13 MED ORDER — PHENOL 1.4 % MT LIQD
1.0000 | OROMUCOSAL | Status: DC | PRN
  Administered 2022-08-13 – 2022-08-14 (×2): 1 via OROMUCOSAL
  Filled 2022-08-13: qty 177

## 2022-08-13 NOTE — ED Notes (Signed)
Report given to [facility]

## 2022-08-13 NOTE — Progress Notes (Signed)
       CROSS COVER NOTE  NAME: Ann Silva MRN: [id number] DOB : 02-May-1946 ATTENDING PHYSICIAN: Aline August, MD    Date of Service   08/13/2022   HPI/Events of Note   Medication request for patient report of 8/10 ear pain, patient reports tylenol is ineffective for her.  Interventions   Assessment/Plan:  Ibuprofen x1      To reach the provider On-Call:   7AM- 7PM see care teams to locate the attending and reach out to them via [email or web address]: [email or web address] 7PM-7AM contact night-coverage If you still have difficulty reaching the appropriate provider, please page the [facility] (Director on Call) for [facility] on amion for assistance  This document was prepared using Systems analyst and may include unintentional dictation errors.  Neomia Glass DNP, MBA, FNP-BC, PMHNP-BC Nurse Practitioner [facility] Pager [phone]

## 2022-08-13 NOTE — ED Notes (Signed)
Pt given sprite. Pt denies any current needs.

## 2022-08-13 NOTE — Progress Notes (Signed)
PROGRESS NOTE    Ann Silva  XAJ:[id number] DOB: 06-Jun-1946 DOA: 08/12/2022 PCP: Rusty Aus, MD   Brief Narrative:  77 year old female with history of hypertension, GERD, history of acute PE and status post lobectomy in 05/20/2022, on Eliquis, COPD, spinal stenosis of the lumbar region, B12 deficiency, aortic atherosclerosis, morbid obesity, bipolar affective disorder presented with altered mental status and fever.  On presentation, temperature was 100.9, respiration of 20, blood pressure of 108/51 and saturating 90% on 2 L nasal cannula.  WBC of 13.8; COVID-19/influenza/RSV PCR negative.  UA was suggestive of UTI.  CTA chest was negative for PE.  She was started on IV fluids and antibiotics.  Assessment & Plan:   Sepsis: Present on admission, from UTI Acute pyelonephritis UTI: Present on admission -Presented with fever, hypotension, altered mental status and hypoxia with leukocytosis and UA suggestive of UTI and CT suggestive of possible left pyelonephritis.  Currently on broad-spectrum antibiotics.  Follow cultures.  Acute metabolic encephalopathy -Possibly from above.  Presented with altered mental status.  Mental status improving.  Monitor.  Leukocytosis -Improving.  Monitor  Acute respiratory failure with hypoxia -Possibly from above.  Currently on 4 L oxygen via nasal cannula.  Wean off as able.  CTA chest was negative for PE or infiltrates.  History of PE -Continue Eliquis  Bipolar disorder -Continue bupropion, aripiprazole, paroxetine.  Outpatient follow-up with cardiology  COPD -Continue current inhaled regimen  Class III obesity/morbid obesity -outpatient follow-up  Physical deconditioning -PT eval  DVT prophylaxis: Eliquis Code Status: Full Family Communication: Family member at bedside  disposition Plan: Status is: Inpatient Remains inpatient appropriate because: Of severity of illness    Consultants: None  Procedures: None  Antimicrobials:   Anti-infectives (From admission, onward)    Start     Dose/Rate Route Frequency Ordered Stop   08/14/22 1800  vancomycin (VANCOREADY) IVPB 1250 mg/250 mL        1,250 mg 166.7 mL/hr over 90 Minutes Intravenous Every 48 hours 08/12/22 2015     08/13/22 0600  ceFEPIme (MAXIPIME) 2 g in sodium chloride 0.9 % 100 mL IVPB        2 g 200 mL/hr over 30 Minutes Intravenous Every 12 hours 08/12/22 2011     08/12/22 2200  azithromycin (ZITHROMAX) 500 mg in sodium chloride 0.9 % 250 mL IVPB        500 mg 250 mL/hr over 60 Minutes Intravenous Every 24 hours 08/12/22 2134 08/17/22 2159   08/12/22 2015  vancomycin (VANCOREADY) IVPB 500 mg/100 mL        500 mg 100 mL/hr over 60 Minutes Intravenous  Once 08/12/22 2014 08/12/22 2235   08/12/22 1615  ceFEPIme (MAXIPIME) 2 g in sodium chloride 0.9 % 100 mL IVPB        2 g 200 mL/hr over 30 Minutes Intravenous  Once 08/12/22 1602 08/12/22 1652   08/12/22 1615  metroNIDAZOLE (FLAGYL) IVPB 500 mg        500 mg 100 mL/hr over 60 Minutes Intravenous  Once 08/12/22 1602 08/12/22 1812   08/12/22 1615  vancomycin (VANCOCIN) IVPB 1000 mg/200 mL premix        1,000 mg 200 mL/hr over 60 Minutes Intravenous  Once 08/12/22 1602 08/12/22 1919        Subjective: Patient seen and examined at bedside.  Feels slightly better but complains of pain on urination.  Denies current nausea, vomiting or flank pain.  Short of breath with slight exertion.  Objective: Vitals:  08/13/22 0651 08/13/22 0700 08/13/22 0915 08/13/22 1000  BP: 114/67 115/69  (!) 100/59  Pulse: 68 (!) 51  77  Resp: 14 (!) 23  (!) 23  Temp:   98.3 F (36.8 C)   TempSrc:   Oral   SpO2: 95% 96%  97%  Weight:      Height:        Intake/Output Summary (Last 24 hours) at 08/13/2022 1058 Last data filed at 08/12/2022 1812 Gross per 24 hour  Intake 1199.41 ml  Output --  Net 1199.41 ml   Filed Weights   08/12/22 1550 08/12/22 1553  Weight: 87.9 kg 88.2 kg    Examination:  General exam:  Appears calm and comfortable.  On 4 L oxygen via nasal cannula.  Looks chronically ill and deconditioned. Respiratory system: Bilateral decreased breath sounds at bases with scattered crackles and intermittent tachypnea Cardiovascular system: S1 & S2 heard, Rate controlled currently Gastrointestinal system: Abdomen is morbidly obese, nondistended, soft and nontender. Normal bowel sounds heard. Extremities: No cyanosis, clubbing; trace lower extremity edema  Central nervous system: Awake, slow to respond, answers some questions.  Poor historian.  No focal neurological deficits. Moving extremities Skin: No rashes, lesions or ulcers Psychiatry: Flat affect.  Not agitated.   Data Reviewed: I have personally reviewed following labs and imaging studies  CBC: Recent Labs  Lab 08/12/22 1613 08/13/22 0456  WBC 13.8* 11.0*  NEUTROABS 12.4*  --   HGB 13.4 12.2  HCT 40.9 38.0  MCV 93.4 96.4  PLT 276 195   Basic Metabolic Panel: Recent Labs  Lab 08/12/22 1613 08/13/22 0456  NA 135 141  K 3.6 3.9  CL 101 108  CO2 24 23  GLUCOSE 111* 97  BUN 21 17  CREATININE 1.06* 0.75  CALCIUM 10.4* 8.5*   GFR: Estimated Creatinine Clearance: 55.6 mL/min (by C-G formula based on SCr of 0.75 mg/dL). Liver Function Tests: Recent Labs  Lab 08/12/22 1613  AST 21  ALT 19  ALKPHOS 108  BILITOT 0.7  PROT 7.3  ALBUMIN 3.1*   No results for input(s): "LIPASE", "AMYLASE" in the last 168 hours. No results for input(s): "AMMONIA" in the last 168 hours. Coagulation Profile: Recent Labs  Lab 08/12/22 1613  INR 1.5*   Cardiac Enzymes: No results for input(s): "CKTOTAL", "CKMB", "CKMBINDEX", "TROPONINI" in the last 168 hours. BNP (last 3 results) No results for input(s): "PROBNP" in the last 8760 hours. HbA1C: No results for input(s): "HGBA1C" in the last 72 hours. CBG: No results for input(s): "GLUCAP" in the last 168 hours. Lipid Profile: No results for input(s): "CHOL", "HDL", "LDLCALC",  "TRIG", "CHOLHDL", "LDLDIRECT" in the last 72 hours. Thyroid Function Tests: No results for input(s): "TSH", "T4TOTAL", "FREET4", "T3FREE", "THYROIDAB" in the last 72 hours. Anemia Panel: No results for input(s): "VITAMINB12", "FOLATE", "FERRITIN", "TIBC", "IRON", "RETICCTPCT" in the last 72 hours. Sepsis Labs: Recent Labs  Lab 08/12/22 1613 08/12/22 2015 08/12/22 2023 08/13/22 0456  PROCALCITON  --   --  6.53 15.60  LATICACIDVEN 1.2 1.8  --   --     Recent Results (from the past 240 hour(s))  Blood Culture (routine x 2)     Status: None (Preliminary result)   Collection Time: 08/12/22  4:12 PM   Specimen: BLOOD  Result Value Ref Range Status   Specimen Description BLOOD BLOOD RIGHT HAND  Final   Special Requests   Final    BOTTLES DRAWN AEROBIC AND ANAEROBIC Blood Culture adequate volume   Culture  Final    NO GROWTH < 12 HOURS Performed at [facility], [address] [place], [place] [address]    Report Status PENDING  Incomplete  Resp panel by RT-PCR (RSV, Flu A&B, Covid) Anterior Nasal Swab     Status: None   Collection Time: 08/12/22  4:13 PM   Specimen: Anterior Nasal Swab  Result Value Ref Range Status   SARS Coronavirus 2 by RT PCR NEGATIVE NEGATIVE Final    Comment: (NOTE) SARS-CoV-2 target nucleic acids are NOT DETECTED.  The SARS-CoV-2 RNA is generally detectable in upper respiratory specimens during the acute phase of infection. The lowest concentration of SARS-CoV-2 viral copies this assay can detect is 138 copies/mL. A negative result does not preclude SARS-Cov-2 infection and should not be used as the sole basis for treatment or other patient management decisions. A negative result may occur with  improper specimen collection/handling, submission of specimen other than nasopharyngeal swab, presence of viral mutation(s) within the areas targeted by this assay, and inadequate number of viral copies(<138 copies/mL). A negative result must be  combined with clinical observations, patient history, and epidemiological information. The expected result is Negative.  Fact Sheet for Patients:  EntrepreneurPulse.com.au  Fact Sheet for Healthcare Providers:  IncredibleEmployment.be  This test is no t yet approved or cleared by the [place] FDA and  has been authorized for detection and/or diagnosis of SARS-CoV-2 by FDA under an Emergency Use Authorization (EUA). This EUA will remain  in effect (meaning this test can be used) for the duration of the COVID-19 declaration under Section 564(b)(1) of the Act, 21 U.S.C.section 360bbb-3(b)(1), unless the authorization is terminated  or revoked sooner.       Influenza A by PCR NEGATIVE NEGATIVE Final   Influenza B by PCR NEGATIVE NEGATIVE Final    Comment: (NOTE) The Xpert Xpress SARS-CoV-2/FLU/RSV plus assay is intended as an aid in the diagnosis of influenza from Nasopharyngeal swab specimens and should not be used as a sole basis for treatment. Nasal washings and aspirates are unacceptable for Xpert Xpress SARS-CoV-2/FLU/RSV testing.  Fact Sheet for Patients: EntrepreneurPulse.com.au  Fact Sheet for Healthcare Providers: IncredibleEmployment.be  This test is not yet approved or cleared by the [place] FDA and has been authorized for detection and/or diagnosis of SARS-CoV-2 by FDA under an Emergency Use Authorization (EUA). This EUA will remain in effect (meaning this test can be used) for the duration of the COVID-19 declaration under Section 564(b)(1) of the Act, 21 U.S.C. section 360bbb-3(b)(1), unless the authorization is terminated or revoked.     Resp Syncytial Virus by PCR NEGATIVE NEGATIVE Final    Comment: (NOTE) Fact Sheet for Patients: EntrepreneurPulse.com.au  Fact Sheet for Healthcare Providers: IncredibleEmployment.be  This test is not yet  approved or cleared by the [place] FDA and has been authorized for detection and/or diagnosis of SARS-CoV-2 by FDA under an Emergency Use Authorization (EUA). This EUA will remain in effect (meaning this test can be used) for the duration of the COVID-19 declaration under Section 564(b)(1) of the Act, 21 U.S.C. section 360bbb-3(b)(1), unless the authorization is terminated or revoked.  Performed at [facility], [address] [place], [place] [address]   Urine Culture     Status: None (Preliminary result)   Collection Time: 08/12/22  4:13 PM   Specimen: Urine, Catheterized  Result Value Ref Range Status   Specimen Description   Final    URINE, CATHETERIZED Performed at [facility], [place]  [address], [place], [place] [address]    Special Requests   Final    NONE Reflexed from [id number] Performed at [facility], [address] [place], [place] [address]    Culture PENDING  Incomplete   Report Status PENDING  Incomplete  Blood Culture (routine x 2)     Status: None (Preliminary result)   Collection Time: 08/12/22  4:14 PM   Specimen: BLOOD  Result Value Ref Range Status   Specimen Description BLOOD BLOOD RIGHT FOREARM  Final   Special Requests   Final    BOTTLES DRAWN AEROBIC AND ANAEROBIC Blood Culture adequate volume   Culture   Final    NO GROWTH < 12 HOURS Performed at [facility], [address], [place], [place] [address]    Report Status PENDING  Incomplete  MRSA Next Gen by PCR, Nasal     Status: None   Collection Time: 08/12/22 10:39 PM   Specimen: Nasal Mucosa; Nasal Swab  Result Value Ref Range Status   MRSA by PCR Next Gen NOT DETECTED NOT DETECTED Final    Comment: (NOTE) The GeneXpert MRSA Assay (FDA approved for NASAL specimens only), is one component of a comprehensive MRSA colonization surveillance program. It is not intended to diagnose MRSA infection nor to guide or monitor treatment for MRSA infections. Test  performance is not FDA approved in patients less than 43 years old. Performed at [facility], [address] [place], [place] [address]          Radiology Studies: CT Angio Chest PE W and/or Wo Contrast  Result Date: 08/12/2022 CLINICAL DATA:  Fever and altered mental status EXAM: CT ANGIOGRAPHY CHEST WITH CONTRAST TECHNIQUE: Multidetector CT imaging of the chest was performed using the standard protocol during bolus administration of intravenous contrast. Multiplanar CT image reconstructions and MIPs were obtained to evaluate the vascular anatomy. RADIATION DOSE REDUCTION: This exam was performed according to the departmental dose-optimization program which includes automated exposure control, adjustment of the mA and/or kV according to patient size and/or use of iterative reconstruction technique. CONTRAST:  75 mL OMNIPAQUE IOHEXOL 350 MG/ML SOLN COMPARISON:  None Available. 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 Descending thoracic aorta is ectatic, 3 cm diameter. No filling defects in pulmonary arteries to indicate PE. No aortic dissection. No pericardial effusion. Mild cardiomegaly. Central pulmonary arteries are prominent consistent with pulmonary arterial hypertension. Mediastinum/Nodes: No enlarged mediastinal, hilar, or axillary lymph nodes. Thyroid gland, trachea appear unremarkable. There may be a an esophageal Zenker's evaluated further non emergently with endoscopy or esophagram. Lungs/Pleura: Fibrotic changes identified as well as emphysematous scarring. There is dependent bibasilar subsegmental atelectasis superimposed on chronic scarring. No pleural effusion or pneumothorax identified. Upper Abdomen: Left kidney appears prominent only partially imaged with suggestion of perinephric fat stranding which may be due to pyelonephritis. This examination did not include the right kidney. Musculoskeletal: No chest wall abnormality.  No acute or significant osseous findings. There are thoracic degenerative changes. Review of the MIP images confirms the above findings. IMPRESSION: 1. No evidence of PE or dissection. 2. 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 3. Mild cardiomegaly. 4. 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 5. Pulmonary fibrosis and emphysematous scarring. 6. Findings in the partially imaged left kidney that could indicate pyelonephritis. Clinical correlation recommended. Electronically Signed   By: Sammie Bench M.D.   On: 08/12/2022 18:05   DG Chest Port 1 View  Result Date: 08/12/2022 CLINICAL DATA:  Fever and  altered mental status. Questionable sepsis. EXAM: PORTABLE CHEST 1 VIEW COMPARISON:  Chest x-ray dated May 19, 2022. FINDINGS: Are remains at the upper limits of normal in size. Chronically coarsened interstitial markings are similar to prior studies. No focal consolidation, pleural effusion, or pneumothorax. No acute osseous abnormality. IMPRESSION: No active disease. Electronically Signed   By: Titus Dubin M.D.   On: 08/12/2022 16:55        Scheduled Meds:  apixaban  5 mg Oral BID   ARIPiprazole  5 mg Oral QHS   buPROPion  75 mg Oral BID   mometasone-formoterol  2 puff Inhalation BID   montelukast  10 mg Oral QHS   pantoprazole  80 mg Oral Daily   PARoxetine  40 mg Oral Daily   Continuous Infusions:  azithromycin Stopped (08/12/22 2340)   ceFEPime (MAXIPIME) IV Stopped (08/13/22 0609)   [START ON 08/14/2022] vancomycin            Aline August, MD [facility] 08/13/2022, 10:58 AM

## 2022-08-13 NOTE — ED Notes (Addendum)
Assisted pt to restroom. Pt returned to bed and placed back on card monitor. External purewick and oxygen placed. No other needs voiced at this time.

## 2022-08-13 NOTE — ED Notes (Signed)
Pt resting comfortably and updated on plan of care.

## 2022-08-13 NOTE — Assessment & Plan Note (Addendum)
UTI-present on admission - Cefepime, vancomycin, urine culture in process

## 2022-08-13 NOTE — Progress Notes (Signed)
PT Cancellation Note  Patient Details Name: Ann Silva MRN: [id number] DOB: 08-11-1945   Cancelled Treatment:    Reason Eval/Treat Not Completed: Other (comment). Orders received and chart reviewed. Per pt and family, RN updated pt she is to be transported momentarily. PT to hold to not disrupt transport to unit. Will re-attempt as pt available.    Salem Caster. Fairly IV, PT, DPT Physical Therapist- [facility]  08/13/2022, 3:04 PM

## 2022-08-13 NOTE — ED Notes (Signed)
Pt's bed readjusted at this time. Family at bedside

## 2022-08-14 DIAGNOSIS — J449 Chronic obstructive pulmonary disease, unspecified: Secondary | ICD-10-CM

## 2022-08-14 DIAGNOSIS — J9601 Acute respiratory failure with hypoxia: Secondary | ICD-10-CM | POA: Diagnosis not present

## 2022-08-14 DIAGNOSIS — I10 Essential (primary) hypertension: Secondary | ICD-10-CM

## 2022-08-14 DIAGNOSIS — A419 Sepsis, unspecified organism: Secondary | ICD-10-CM | POA: Diagnosis not present

## 2022-08-14 DIAGNOSIS — D72829 Elevated white blood cell count, unspecified: Secondary | ICD-10-CM | POA: Diagnosis not present

## 2022-08-14 LAB — COMPREHENSIVE METABOLIC PANEL
ALT: 18 U/L (ref 0–44)
AST: 19 U/L (ref 15–41)
Albumin: 2.7 g/dL — ABNORMAL LOW (ref 3.5–5.0)
Alkaline Phosphatase: 104 U/L (ref 38–126)
Anion gap: 6 (ref 5–15)
BUN: 18 mg/dL (ref 8–23)
CO2: 24 mmol/L (ref 22–32)
Calcium: 10.2 mg/dL (ref 8.9–10.3)
Chloride: 107 mmol/L (ref 98–111)
Creatinine, Ser: 1.14 mg/dL — ABNORMAL HIGH (ref 0.44–1.00)
GFR, Estimated: 50 mL/min — ABNORMAL LOW (ref >60)
Glucose, Bld: 114 mg/dL — ABNORMAL HIGH (ref 70–99)
Potassium: 4.1 mmol/L (ref 3.5–5.1)
Sodium: 137 mmol/L (ref 135–145)
Total Bilirubin: 0.6 mg/dL (ref 0.3–1.2)
Total Protein: 6.8 g/dL (ref 6.5–8.1)

## 2022-08-14 LAB — CBC WITH DIFFERENTIAL/PLATELET
Abs Immature Granulocytes: 0.09 10*3/uL — ABNORMAL HIGH (ref 0.00–0.07)
Basophils Absolute: 0 10*3/uL (ref 0.0–0.1)
Basophils Relative: 0 %
Eosinophils Absolute: 0.2 10*3/uL (ref 0.0–0.5)
Eosinophils Relative: 2 %
HCT: 38.5 % (ref 36.0–46.0)
Hemoglobin: 12.3 g/dL (ref 12.0–15.0)
Immature Granulocytes: 1 %
Lymphocytes Relative: 13 %
Lymphs Abs: 1.3 10*3/uL (ref 0.7–4.0)
MCH: 30.3 pg (ref 26.0–34.0)
MCHC: 31.9 g/dL (ref 30.0–36.0)
MCV: 94.8 fL (ref 80.0–100.0)
Monocytes Absolute: 0.8 10*3/uL (ref 0.1–1.0)
Monocytes Relative: 9 %
Neutro Abs: 7.4 10*3/uL (ref 1.7–7.7)
Neutrophils Relative %: 75 %
Platelets: 270 10*3/uL (ref 150–400)
RBC: 4.06 MIL/uL (ref 3.87–5.11)
RDW: 14.6 % (ref 11.5–15.5)
WBC: 9.8 10*3/uL (ref 4.0–10.5)
nRBC: 0 % (ref 0.0–0.2)

## 2022-08-14 LAB — MAGNESIUM: Magnesium: 2.2 mg/dL (ref 1.7–2.4)

## 2022-08-14 LAB — PROCALCITONIN: Procalcitonin: 13.1 ng/mL

## 2022-08-14 MED ORDER — METOPROLOL SUCCINATE ER 50 MG PO TB24
50.0000 mg | ORAL_TABLET | Freq: Every day | ORAL | Status: DC
  Administered 2022-08-14 – 2022-08-15 (×2): 50 mg via ORAL
  Filled 2022-08-14 (×2): qty 1

## 2022-08-14 MED ORDER — LACTATED RINGERS IV SOLN: INTRAVENOUS | Status: DC

## 2022-08-14 NOTE — Progress Notes (Signed)
Progress Note   Patient: Ann Silva TML:[id number] DOB: 09-05-1945 DOA: 08/12/2022     2 DOS: the patient was seen and examined on 08/14/2022   Brief hospital course: Ms. Ann Silva is a 77 year old female with history of hypertension, GERD, history of acute PE and status post lobectomy in 05/20/2022, on Eliquis, COPD, spinal stenosis of the lumbar region, B12 deficiency, aortic atherosclerosis, morbid obesity, bipolar affective disorder, who presents emergency department for chief concerns of altered mental status and fever.  Initial vitals in the ED showed temperature of 100.9, respiration rate of 20, heart rate of 85, blood pressure 108/61, SpO2 of 90% on 2 L nasal cannula.  Of note in the ED, ED team tested 2 L oxygen removal and patient desatted to 87% on room air.  Patient's nasal cannula was replaced back to the nares immediately.  Serum sodium is 135, potassium 3.6, chloride 101, bicarb 24, BUN of 21, serum creatinine of 1.06, EGFR 54, nonfasting blood glucose 111, WBC 13.8, hemoglobin 13.4, platelets of 276. UA positive for protein urea, nitrates and large leukocytes. COVID/influenza A/influenza B/RSV PCR were negative. 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  Stable pulmonary fibrosis and emphysematous scarring.  Partially imaged left kidney with concern of pyelonephritis. ED treatment: Acetaminophen 650 mg p.o. one-time dose, DuoNebs x 1, cefepime, LR 1 L bolus, vancomycin, metronidazole.  Admitted with concern of sepsis secondary to UTI.  2/7: Vital stable, on 3 L of oxygen, no baseline oxygen use.  Procalcitonin started improving with peak at 15.60.  Slight worsening of creatinine to 1.14.  Giving some gentle IV fluid.  Blood cultures negative so far and urine cultures with E. coli-pending susceptibility.  PT was recommending home health services but patient declines.  Will try weaning from  oxygen  Assessment and Plan: * Acute hypoxemic respiratory failure ([place]) Most likely secondary to sepsis. - CTA of the chest per EDP was read negative for PE or dissection. - Etiology workup in progress   Sepsis secondary to UTI [facility]) Patient met sepsis criteria with fever, leukocytosis, most likely secondary to UTI.  Procalcitonin peaked at 15.60 and now started trending down.  Blood cultures remain negative and urine cultures growing E. coli.  CT abdomen with concern of pyelonephritis. -Continue with cefepime-we will de-escalate once susceptibilities available  SIRS due to infectious process with organ dysfunction ([place]) -See above  Leukocytosis - Presumed secondary to UTI  Acute pulmonary embolism with acute cor pulmonale (HCC)  History of PE on right middle lobe, with CT evidence of right heart strain on 05/19/2022.  Repeat CTA negative for PE - Patient endorses compliance with anticoagulation - Eliquis 5 mg p.o. twice daily resumed  Hypertension Blood pressure within goal. -Restarting home metoprolol -Keep holding Lasix, spironolactone - Hydralazine 5 mg IV every 8 hours as needed for SBP greater than 175, 4 days ordered  Tobacco use disorder Per patient she recently quit.  COPD (chronic obstructive pulmonary disease) (HCC) No concern of acute exacerbation. -Continue bronchodilators  Obesity, Class III, BMI 40-49.9 (morbid obesity) ([place]) Estimated body mass index is 39.42 kg/m as calculated from the following:   Height as of this encounter: '4\' 10"'$  (1.473 m).   Weight as of this encounter: 85.5 kg.   - This complicates overall care and prognosis.     Subjective: Patient was seen and examined today.  Denies any flank pain.  Continues to have some dysuria but stating that  it is improving.  No baseline oxygen use.  Physical Exam: Vitals:   08/14/22 0534 08/14/22 0741 08/14/22 0832 08/14/22 1147  BP: 122/62 (!) 110/59  117/67  Pulse: 69 (!) 58  73  Resp: '18 16  18   '$ Temp: 97.7 F (36.5 C) 97.9 F (36.6 C)  98.4 F (36.9 C)  TempSrc: Oral     SpO2: 97% 96%  98%  Weight:   85.5 kg   Height:       General.  Obese elderly lady, in no acute distress. Pulmonary.  Lungs clear bilaterally, normal respiratory effort. CV.  Regular rate and rhythm, no JVD, rub or murmur. Abdomen.  Soft, nontender, nondistended, BS positive. CNS.  Alert and oriented .  No focal neurologic deficit. Extremities.  No edema, no cyanosis, pulses intact and symmetrical. Psychiatry.  Judgment and insight appears normal.   Data Reviewed: Prior data reviewed  Family Communication: Discussed with DIL at bedside  Disposition: Status is: Inpatient Remains inpatient appropriate because: Severity of illness  Planned Discharge Destination: Home  DVT prophylaxis.  Eliquis Time spent: 50 minutes  This record has been created using Systems analyst. Errors have been sought and corrected,but may not always be located. Such creation errors do not reflect on the standard of care.   Author: Lorella Nimrod, MD 08/14/2022 3:09 PM  For on call review [email or web address].

## 2022-08-14 NOTE — TOC CM/SW Note (Signed)
TOC following for disposition needs. PT eval pending. Patient also appears to be on acute oxygen.  Dayton Scrape, [place]

## 2022-08-14 NOTE — Assessment & Plan Note (Signed)
No concern of acute exacerbation. -Continue bronchodilators

## 2022-08-14 NOTE — Evaluation (Signed)
Physical Therapy Evaluation Patient Details Name: Ann Silva MRN: [id number] DOB: Aug 24, 1945 Today's Date: 08/14/2022  History of Present Illness  Ms. Ann Silva is a 77 year old female with history of hypertension, GERD, history of acute PE and status post lobectomy in 05/20/2022, on Eliquis, COPD, spinal stenosis of the lumbar region, B12 deficiency, aortic atherosclerosis, morbid obesity, bipolar affective disorder, who presents emergency department for chief concerns of altered mental status and fever.   Clinical Impression  Pt admitted with above diagnosis. Pt received upright in bed with DIL present and agreeable to PT services. Pt resting on 3 L/min with SPO2 > 90% at rest and with mobility. Pt reports at baseline she is independent to mod-I with gait sometimes requiring her rollator or SPC but typically does not need them and is also independent with her ADL's.   Pt endorsing need to use bathroom requiring female assistance for comfort thus NT called to assist. Pt reliant on MinA for bed mobility with bed features and minguard to STS to IV pole. Pt takes small, step to steps with SUE support on IV pole and frequent reaching for counters in room for added stability sometimes using BUE support on IV pole. NT assisted pt in bathroom then finished ambulation ~45' in room with some minor DOE towards end of gait bout. Educated pt on using rollator for improved stability and energy conservation with pt agreeing, sitting EOB returning to supine at supervision level. Pt with all needs in reach, encouraging OOB mobility with nursing to improve functional strength and capacity for ADL's in home setting. Pt currently with functional limitations due to the deficits listed below (see PT Problem List). Pt will benefit from skilled PT to increase their independence and safety with mobility to allow discharge to the venue listed below.      Recommendations for follow up therapy are one component of a  multi-disciplinary discharge planning process, led by the attending physician.  Recommendations may be updated based on patient status, additional functional criteria and insurance authorization.  Follow Up Recommendations Home health PT      Assistance Recommended at Discharge Frequent or constant Supervision/Assistance  Patient can return home with the following  A little help with walking and/or transfers;Assistance with cooking/housework;Assist for transportation;A little help with bathing/dressing/bathroom;Help with stairs or ramp for entrance    Equipment Recommendations None recommended by PT  Recommendations for Other Services       Functional Status Assessment Patient has had a recent decline in their functional status and demonstrates the ability to make significant improvements in function in a reasonable and predictable amount of time.     Precautions / Restrictions Precautions Precautions: Fall Restrictions Weight Bearing Restrictions: No      Mobility  Bed Mobility Overal bed mobility: Needs Assistance Bed Mobility: Supine to Sit     Supine to sit: Min assist, HOB elevated       Patient Response: Cooperative  Transfers Overall transfer level: Needs assistance Equipment used: Rolling walker (2 wheels) Transfers: Sit to/from Stand Sit to Stand: Supervision                Ambulation/Gait   Gait Distance (Feet): 45 Feet Assistive device: IV Pole Gait Pattern/deviations: Step-to pattern       General Gait Details: pt requiring BUE support on IV pole for stability due to reports of LE weakness  Stairs            Wheelchair Mobility    Modified Rankin (Stroke Patients  Only)       Balance Overall balance assessment: Needs assistance Sitting-balance support: Bilateral upper extremity supported, Feet supported Sitting balance-Leahy Scale: Good         Standing balance comment: light SUE support on IV pole                              Pertinent Vitals/Pain Pain Assessment Pain Assessment: No/denies pain    Home Living Family/patient expects to be discharged to:: Private residence Living Arrangements: Spouse/significant other Available Help at Discharge: Family;Available 24 hours/day Type of Home: Mobile home Home Access: Stairs to enter Entrance Stairs-Rails: Right;Left Entrance Stairs-Number of Steps: 2   Home Layout: One level Home Equipment: Rollator (4 wheels);Cane - single point      Prior Function Prior Level of Function : Independent/Modified Independent             Mobility Comments: Reports has SPC and rollator but never uses is unless she is having a "bad day"       Hand Dominance        Extremity/Trunk Assessment   Upper Extremity Assessment Upper Extremity Assessment: Overall WFL for tasks assessed    Lower Extremity Assessment Lower Extremity Assessment: Generalized weakness    Cervical / Trunk Assessment Cervical / Trunk Assessment: Normal  Communication   Communication: No difficulties  Cognition Arousal/Alertness: Awake/alert Behavior During Therapy: WFL for tasks assessed/performed Overall Cognitive Status: Within Functional Limits for tasks assessed                                          General Comments General comments (skin integrity, edema, etc.): SPo2 > 90% on 3L/min    Exercises Other Exercises Other Exercises: Role of PT in acute setting, d/c recs, energy conservation techniques and use of rollator   Assessment/Plan    PT Assessment Patient needs continued PT services  PT Problem List Decreased strength;Decreased knowledge of use of DME;Decreased safety awareness;Decreased activity tolerance;Decreased balance;Decreased mobility       PT Treatment Interventions DME instruction;Balance training;Gait training;Neuromuscular re-education;Stair training;Functional mobility training;Patient/family education;Therapeutic  activities;Therapeutic exercise    PT Goals (Current goals can be found in the Care Plan section)  Acute Rehab PT Goals Patient Stated Goal: to go home PT Goal Formulation: With patient/family Time For Goal Achievement: 08/28/22 Potential to Achieve Goals: Good    Frequency Min 2X/week     Co-evaluation               AM-PAC PT "6 Clicks" Mobility  Outcome Measure Help needed turning from your back to your side while in a flat bed without using bedrails?: A Little Help needed moving from lying on your back to sitting on the side of a flat bed without using bedrails?: A Little Help needed moving to and from a bed to a chair (including a wheelchair)?: A Little Help needed standing up from a chair using your arms (e.g., wheelchair or bedside chair)?: A Little Help needed to walk in hospital room?: A Little Help needed climbing 3-5 steps with a railing? : A Lot 6 Click Score: 17    End of Session Equipment Utilized During Treatment: Gait belt;Oxygen Activity Tolerance: Patient tolerated treatment well Patient left: in bed;with call bell/phone within reach;with bed alarm set;with family/visitor present Nurse Communication: Mobility status PT Visit Diagnosis: Other abnormalities of  gait and mobility (R26.89);Muscle weakness (generalized) (M62.81)    Time: 5929-2446 PT Time Calculation (min) (ACUTE ONLY): 25 min   Charges:   PT Evaluation $PT Eval Low Complexity: 1 Low PT Treatments $Therapeutic Activity: 8-22 mins      Joshaua Epple M. Fairly IV, PT, DPT Physical Therapist- [facility]  08/14/2022, 11:16 AM

## 2022-08-14 NOTE — Assessment & Plan Note (Signed)
Per patient she recently quit.

## 2022-08-14 NOTE — TOC Initial Note (Signed)
Transition of Care [facility]) - Initial/Assessment Note    Patient Details  Name: Ann Silva MRN: [id number] Date of Birth: 04/12/1946  Transition of Care [facility]) CM/SW Contact:    Candie Chroman, LCSW Phone Number: 08/14/2022, 11:43 AM  Clinical Narrative:  CSW met with patient. Family member at bedside. CSW introduced role and explained that PT recommendations would be discussed. Patient is not interested in home health or outpatient PT. She reports recently having home health within the last 1-2 months. Encouraged her to notify TOC if she changes her mind prior to discharge or her PCP if she changes her mind after discharge. No DME recommendations. Patient is currently on acute oxygen so will follow for this potential need. No further concerns. CSW will continue to follow patient for support and facilitate return home once stable. Husband will transport at discharge.                Expected Discharge Plan: Home/Self Care Barriers to Discharge: Continued Medical Work up   Patient Goals and CMS Choice            Expected Discharge Plan and Services     Post Acute Care Choice: NA Living arrangements for the past 2 months: Single Family Home                                      Prior Living Arrangements/Services Living arrangements for the past 2 months: Single Family Home Lives with:: Spouse Patient language and need for interpreter reviewed:: Yes Do you feel safe going back to the place where you live?: Yes      Need for Family Participation in Patient Care: Yes (Comment) Care giver support system in place?: Yes (comment)   Criminal Activity/Legal Involvement Pertinent to Current Situation/Hospitalization: No - Comment as needed  Activities of Daily Living Home Assistive Devices/Equipment: Eyeglasses ADL Screening (condition at time of admission) Patient's cognitive ability adequate to safely complete daily activities?: Yes Is the patient deaf or have difficulty  hearing?: No Does the patient have difficulty seeing, even when wearing glasses/contacts?: No Does the patient have difficulty concentrating, remembering, or making decisions?: Yes Patient able to express need for assistance with ADLs?: Yes Does the patient have difficulty dressing or bathing?: No Independently performs ADLs?: No Communication: Independent Dressing (OT): Independent Grooming: Independent Feeding: Independent Bathing: Needs assistance Is this a change from baseline?: Change from baseline, expected to last <3 days Toileting: Needs assistance Is this a change from baseline?: Change from baseline, expected to last <3 days In/Out Bed: Needs assistance Is this a change from baseline?: Change from baseline, expected to last <3 days Walks in Home: Needs assistance Is this a change from baseline?: Change from baseline, expected to last <3 days Does the patient have difficulty walking or climbing stairs?: No Weakness of Legs: None Weakness of Arms/Hands: None  Permission Sought/Granted                  Emotional Assessment Appearance:: Appears stated age Attitude/Demeanor/Rapport: Engaged Affect (typically observed): Appropriate, Calm, Pleasant Orientation: : Oriented to Self, Oriented to Place, Oriented to  Time, Oriented to Situation Alcohol / Substance Use: Not Applicable Psych Involvement: No (comment)  Admission diagnosis:  Sepsis secondary to UTI ([place]) [A41.9, N39.0] Acute hypoxemic respiratory failure ([place]) [J96.01] Patient Active Problem List   Diagnosis Date Noted   Sepsis secondary to UTI ([place]) 08/13/2022  Acute hypoxemic respiratory failure (HCC) 08/12/2022   Obesity, Class III, BMI 40-49.9 (morbid obesity) ([place]) 08/12/2022   Leukocytosis 08/12/2022   SIRS due to infectious process with organ dysfunction ([place]) 08/12/2022   Acute pulmonary embolism with acute cor pulmonale (HCC) 06/06/2022   Obesity (BMI 30-39.9) 05/20/2022   Hyperparathyroidism  ([place]) 05/20/2022   Chronic pain 05/20/2022   Essential hypertension 05/20/2022   Tobacco abuse 05/20/2022   Acute pulmonary embolism ([place]) 05/19/2022   Influenza due to identified novel influenza A virus with other respiratory manifestations 05/19/2022   COPD (chronic obstructive pulmonary disease) ([place]) 05/19/2022   AKI (acute kidney injury) ([place]) 05/19/2022   Acute respiratory failure with hypoxia ([place]) 05/19/2022   Hypercalcemia 05/19/2022   Dislocated hip ([place]) 12/11/2020   S/P closed reduction of dislocated total hip prosthesis 12/11/2020   Chronic bilateral low back pain with left-sided sciatica (Primary Area of Pain) 03/12/2018   Chronic pain of left lower extremity (Secondary Area of Pain) 03/12/2018   Chronic pain syndrome 03/12/2018   Long term current use of opiate analgesic 03/12/2018   Pharmacologic therapy 03/12/2018   Disorder of skeletal system 03/12/2018   Problems influencing health status 03/12/2018   Hyperparathyroidism, primary ([place]) 01/20/2018   Medicare annual wellness visit, initial 09/02/2017   Acute saddle pulmonary embolism with acute cor pulmonale ([place]) 02/20/2017   Dyspnea 12/29/2016   Acute congestive heart failure ([place])    HCAP (healthcare-associated pneumonia)    Pulmonary embolus ([place])    Lumbar adjacent segment disease with spondylolisthesis 12/25/2016   Aortic atherosclerosis ([place]) 08/14/2016   Lymphedema of left leg 08/05/2016   B12 deficiency 05/15/2016   Chronic contact dermatitis 05/01/2016   History of colon cancer 05/01/2016   Mild chronic obstructive pulmonary disease ([place]) 05/01/2016   COPD with acute exacerbation ([place]) 11/27/2015   COPD exacerbation ([place]) 08/21/2015   Spinal stenosis of lumbar region 04/02/2015   Restless legs 02/27/2015   Hypertension    Tobacco use disorder 09/12/2014   Anemia 06/17/2014   Sedated due to medication 06/17/2014   Obstructive sleep apnea 06/16/2014   Benign essential hypertension 06/16/2014    Restless legs syndrome 06/16/2014   Colon cancer ([place])    Depression    History of total hip replacement, left 10/25/2013   S/P total hip arthroplasty 10/25/2013   PCP:  Rusty Aus, MD Pharmacy:   [id number] [address], [address] [place] [address] Phone: [phone] Fax: [phone]     Social Determinants of Health (SDOH) Social History: SDOH Screenings   Food Insecurity: No Food Insecurity (08/13/2022)  Housing: Low Risk  (08/13/2022)  Transportation Needs: No Transportation Needs (08/13/2022)  Utilities: Not At Risk (08/13/2022)  Tobacco Use: Medium Risk (08/12/2022)   SDOH Interventions:     Readmission Risk Interventions     No data to display

## 2022-08-15 DIAGNOSIS — J9601 Acute respiratory failure with hypoxia: Secondary | ICD-10-CM | POA: Diagnosis not present

## 2022-08-15 LAB — URINE CULTURE: Culture: >=100000 — AB

## 2022-08-15 LAB — CREATININE, SERUM
Creatinine, Ser: 0.66 mg/dL (ref 0.44–1.00)
GFR, Estimated: >60 mL/min (ref >60)

## 2022-08-15 MED ORDER — CEPHALEXIN 500 MG PO CAPS: 500.0000 mg | ORAL_CAPSULE | Freq: Two times a day (BID) | ORAL | 0 refills | Status: AC

## 2022-08-15 NOTE — TOC Transition Note (Signed)
Transition of Care [facility]) - CM/SW Discharge Note   Patient Details  Name: Ann Silva MRN: [id number] Date of Birth: [birth year]  [facility] CM/SW Contact:  Candie Chroman, LCSW Phone Number: 08/15/2022, 1:33 PM   Clinical Narrative:  Patient has orders to discharge home today. No further concerns. CSW signing off.   Final next level of care: Home/Self Care Barriers to Discharge: Barriers Resolved   Patient Goals and CMS Choice      Discharge Placement                  Patient to be transferred to facility by: Husband   Patient and family notified of of transfer: 08/15/22  Discharge Plan and Services Additional resources added to the After Visit Summary for       Post Acute Care Choice: NA                               Social Determinants of Health (SDOH) Interventions SDOH Screenings   Food Insecurity: No Food Insecurity (08/13/2022)  Housing: Low Risk  (08/13/2022)  Transportation Needs: No Transportation Needs (08/13/2022)  Utilities: Not At Risk (08/13/2022)  Tobacco Use: Medium Risk (08/12/2022)     Readmission Risk Interventions     No data to display

## 2022-08-15 NOTE — Discharge Instructions (Signed)
Advised to take Keflex 500 mg twice daily for 5 more days to complete 7-day treatment for E. coli UTI.

## 2022-08-15 NOTE — Discharge Summary (Signed)
Physician Discharge Summary  Ann Silva WCH:[id number] DOB: 12/31/1945 DOA: 08/12/2022  PCP: Rusty Aus, MD  Admit date: 08/12/2022  Discharge date: 08/15/2022  Admitted From: Home  Disposition:  Home  Recommendations for Outpatient Follow-up:  Follow up with PCP in 1-2 weeks. Please obtain BMP/CBC in one week. Advised to take Keflex 500 mg twice daily for 5 more days to complete 7-day treatment for E. coli UTI.  Home Health:None Equipment/Devices:None  Discharge Condition: Stable CODE STATUS:Full code Diet recommendation: Heart Healthy   Brief [facility] Course: Ann Silva is a 77 year old female with history of hypertension, GERD, history of acute PE and status post lobectomy in 05/20/2022, on Eliquis, COPD, spinal stenosis of the lumbar region, B12 deficiency, aortic atherosclerosis, morbid obesity, bipolar affective disorder, who presents emergency department for chief concerns of altered mental status and fever. Labs include :UA positive for protein urea, nitrates and large leukocytes. COVID/influenza A/influenza B/RSV PCR were negative. 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  Stable pulmonary fibrosis and emphysematous scarring.  Partially imaged left kidney with concern of pyelonephritis. Admitted with concern of sepsis secondary to UTI. She was initiated on IV antibiotics. Procalcitonin started improving with peak at 15.60.  Slight worsening of creatinine to 1.14. She was given IV fluid resuscitation.  Blood cultures negative so far and urine cultures with E. Coli-pansensitive.  PT was recommending home health services but Ann Silva declines.  She is successfully weaned down to room air.  Ann Silva feels better and want to be discharged.  Antibiotics changed to Keflex for 5 more days to complete 7-day treatment for E. coli UTI.  Ann Silva is being discharged home.  Discharge Diagnoses:   Principal Problem:   Acute hypoxemic respiratory failure (HCC) Active Problems:   Sepsis secondary to UTI ([place])   SIRS due to infectious process with organ dysfunction (HCC)   Leukocytosis   Acute pulmonary embolism with acute cor pulmonale (HCC)   Hypertension   Tobacco use disorder   COPD (chronic obstructive pulmonary disease) (HCC)   Obesity, Class III, BMI 40-49.9 (morbid obesity) ([place])  Acute hypoxemic respiratory failure ([place]) Most likely secondary to sepsis. CTA of the chest per EDP was read negative for PE or dissection. Etiology workup in progress Resolved.  Weaned down to room air   Sepsis secondary to UTI [facility]) Ann Silva met sepsis criteria with fever, leukocytosis, most likely secondary to UTI.   Procalcitonin peaked at 15.60 and now started trending down.   Blood cultures remain negative and urine cultures growing E. coli.   CT abdomen with concern of pyelonephritis. Continue with cefepime-we will de-escalate once susceptibilities available. Antibiotic changed to Keflex.  Ann Silva is being discharged.   SIRS due to infectious process with organ dysfunction ([place]) -See above   Leukocytosis - Presumed secondary to UTI   Acute pulmonary embolism with acute cor pulmonale (HCC)  History of PE on right middle lobe, with CT evidence of right heart strain on 05/19/2022.   Repeat CTA negative for PE - Ann Silva endorses compliance with anticoagulation - Eliquis 5 mg p.o. twice daily resumed   Hypertension Blood pressure within goal. -Restarting home metoprolol -Keep holding Lasix, spironolactone - Hydralazine 5 mg IV every 8 hours as needed for SBP greater than 175, 4 days ordered   Tobacco use disorder Per Ann Silva she recently quit.   COPD (chronic obstructive pulmonary disease) (HCC) No concern of acute exacerbation. -Continue bronchodilators   Obesity, Class  III, BMI 40-49.9 (morbid obesity) ([place]) Estimated body mass index is 39.42 kg/m as calculated from the  following:   Height as of this encounter: '4\' 10"'$  (1.473 m).   Weight as of this encounter: 85.5 kg.    - This complicates overall care and prognosis.     Discharge Instructions  Discharge Instructions     Call MD for:  difficulty breathing, headache or visual disturbances   Complete by: As directed    Call MD for:  persistant dizziness or light-headedness   Complete by: As directed    Call MD for:  persistant nausea and vomiting   Complete by: As directed    Diet - low sodium heart healthy   Complete by: As directed    Diet Carb Modified   Complete by: As directed    Discharge instructions   Complete by: As directed    Follow-up with primary care physician in 1 week. Advised to take Keflex 500 mg twice daily for 5 more days to complete 7-day treatment for E. coli UTI.   Increase activity slowly   Complete by: As directed       Allergies as of 08/15/2022       Reactions   Hydrocodone-chlorpheniramine Shortness Of Breath   Sulfa Antibiotics Shortness Of Breath   Sulfa Antibiotics Shortness Of Breath   Chlorhexidine Rash   Methadone Other (See Comments)   Altered Mental Status   Benadryl [diphenhydramine] Anxiety   Latex Rash   Tape Rash   "the white kind they use after surgery"        Medication List     STOP taking these medications    aspirin 81 MG chewable tablet   cyclobenzaprine 10 MG tablet Commonly known as: FLEXERIL   etodolac 400 MG tablet Commonly known as: LODINE   fluticasone 50 MCG/ACT nasal spray Commonly known as: FLONASE   gabapentin 100 MG capsule Commonly known as: NEURONTIN   multivitamin with minerals Tabs tablet   nystatin 100000 UNIT/ML suspension Commonly known as: MYCOSTATIN       TAKE these medications    acetaminophen 500 MG tablet Commonly known as: TYLENOL Take 1,000 mg by mouth every 6 (six) hours as needed for moderate pain or headache.   albuterol 108 (90 Base) MCG/ACT inhaler Commonly known as: VENTOLIN  HFA Inhale 2 puffs into the lungs every 4 (four) hours as needed for wheezing or shortness of breath.   apixaban 5 MG Tabs tablet Commonly known as: ELIQUIS Take 1 tablet (5 mg total) by mouth 2 (two) times daily. Only start this script after completing eliquis '10mg'$  BID x 4 days more   ARIPiprazole 5 MG tablet Commonly known as: ABILIFY Take 5 mg by mouth at bedtime.   buPROPion 75 MG tablet Commonly known as: WELLBUTRIN Take 75 mg by mouth 2 (two) times daily. Unsure of dose   cephALEXin 500 MG capsule Commonly known as: KEFLEX Take 1 capsule (500 mg total) by mouth 2 (two) times daily for 5 days.   fluticasone-salmeterol 250-50 MCG/ACT Aepb Commonly known as: ADVAIR Inhale into the lungs. What changed: Another medication with the same name was removed. Continue taking this medication, and follow the directions you see here.   furosemide 20 MG tablet Commonly known as: Lasix Take 1 tablet (20 mg total) by mouth daily.   ipratropium-albuterol 0.5-2.5 (3) MG/3ML Soln Commonly known as: DUONEB Take 3 mLs by nebulization every 6 (six) hours as needed (for wheezing/shortness of breath).   metoprolol  succinate 50 MG 24 hr tablet Commonly known as: TOPROL-XL Take 50 mg by mouth daily. What changed: Another medication with the same name was removed. Continue taking this medication, and follow the directions you see here.   montelukast 10 MG tablet Commonly known as: SINGULAIR Take 10 mg by mouth daily.   omeprazole 40 MG capsule Commonly known as: PRILOSEC Take 40 mg by mouth daily.   oxybutynin 5 MG tablet Commonly known as: DITROPAN Take 5 mg by mouth 2 (two) times daily.   PARoxetine 40 MG tablet Commonly known as: PAXIL Take 40 mg by mouth daily at 2 PM.   rOPINIRole 3 MG tablet Commonly known as: REQUIP Take 1 tablet (3 mg total) by mouth 4 (four) times daily.   spironolactone 25 MG tablet Commonly known as: ALDACTONE Take 25 mg by mouth daily.   temazepam  30 MG capsule Commonly known as: RESTORIL Take 30 mg by mouth at bedtime as needed. What changed: Another medication with the same name was removed. Continue taking this medication, and follow the directions you see here.   Trelegy Ellipta 200-62.5-25 MCG/ACT Aepb Generic drug: Fluticasone-Umeclidin-Vilant Inhale 1 puff into the lungs daily.        Follow-up Information     Rusty Aus, MD. Go in 1 week(s).   Specialty: Internal Medicine Why: Appointment on Wednesday, 08/21/2022 at 9:45am. Contact information: [place] [address] [phone]                Allergies  Allergen Reactions   Hydrocodone-Chlorpheniramine Shortness Of Breath   Sulfa Antibiotics Shortness Of Breath   Sulfa Antibiotics Shortness Of Breath   Chlorhexidine Rash   Methadone Other (See Comments)    Altered Mental Status   Benadryl [Diphenhydramine] Anxiety   Latex Rash   Tape Rash    "the white kind they use after surgery"    Consultations: None   Procedures/Studies: CT Angio Chest PE W and/or Wo Contrast  Result Date: 08/12/2022 CLINICAL DATA:  Fever and altered mental status EXAM: CT ANGIOGRAPHY CHEST WITH CONTRAST TECHNIQUE: Multidetector CT imaging of the chest was performed using the standard protocol during bolus administration of intravenous contrast. Multiplanar CT image reconstructions and MIPs were obtained to evaluate the vascular anatomy. RADIATION DOSE REDUCTION: This exam was performed according to the departmental dose-optimization program which includes automated exposure control, adjustment of the mA and/or kV according to Ann Silva size and/or use of iterative reconstruction technique. CONTRAST:  75 mL OMNIPAQUE IOHEXOL 350 MG/ML SOLN COMPARISON:  None Available. 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 Descending thoracic aorta is ectatic, 3 cm diameter. No filling  defects in pulmonary arteries to indicate PE. No aortic dissection. No pericardial effusion. Mild cardiomegaly. Central pulmonary arteries are prominent consistent with pulmonary arterial hypertension. Mediastinum/Nodes: No enlarged mediastinal, hilar, or axillary lymph nodes. Thyroid gland, trachea appear unremarkable. There may be a an esophageal Zenker's evaluated further non emergently with endoscopy or esophagram. Lungs/Pleura: Fibrotic changes identified as well as emphysematous scarring. There is dependent bibasilar subsegmental atelectasis superimposed on chronic scarring. No pleural effusion or pneumothorax identified. Upper Abdomen: Left kidney appears prominent only partially imaged with suggestion of perinephric fat stranding which may be due to pyelonephritis. This examination did not include the right kidney. Musculoskeletal: No chest wall abnormality. No acute or significant osseous findings. There are thoracic degenerative changes. Review of the MIP images confirms the above findings. IMPRESSION: 1. No evidence  of PE or dissection. 2. 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 3. Mild cardiomegaly. 4. 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 5. Pulmonary fibrosis and emphysematous scarring. 6. Findings in the partially imaged left kidney that could indicate pyelonephritis. Clinical correlation recommended. Electronically Signed   By: Sammie Bench M.D.   On: 08/12/2022 18:05   DG Chest Port 1 View  Result Date: 08/12/2022 CLINICAL DATA:  Fever and altered mental status. Questionable sepsis. EXAM: PORTABLE CHEST 1 VIEW COMPARISON:  Chest x-ray dated May 19, 2022. FINDINGS: Are remains at the upper limits of normal in size. Chronically coarsened interstitial markings are similar to prior studies. No focal consolidation, pleural effusion, or pneumothorax. No acute osseous abnormality. IMPRESSION: No active disease. Electronically Signed   By: Titus Dubin M.D.   On: 08/12/2022  16:55     Subjective: Ann Silva was seen and examined at bedside.  Overnight events noted.   Ann Silva report doing much better and want to be discharged.  Ann Silva is being discharged home,  she denies any further urinary tract symptoms.  Discharge Exam: Vitals:   08/15/22 0723 08/15/22 1132  BP: 132/80 127/67  Pulse: 71 71  Resp: 18 18  Temp: 98.6 F (37 C) 98.9 F (37.2 C)  SpO2: 93% 94%   Vitals:   08/15/22 0409 08/15/22 0723 08/15/22 0838 08/15/22 1132  BP: 139/76 132/80  127/67  Pulse: 75 71  71  Resp: '20 18  18  '$ Temp: 98.9 F (37.2 C) 98.6 F (37 C)  98.9 F (37.2 C)  TempSrc:  Oral  Oral  SpO2: 94% 93%  94%  Weight:   85.5 kg   Height:        General: Pt is alert, awake, not in acute distress Cardiovascular: RRR, S1/S2 +, no rubs, no gallops Respiratory: CTA bilaterally, no wheezing, no rhonchi Abdominal: Soft, NT, ND, bowel sounds + Extremities: no edema, no cyanosis    The results of significant diagnostics from this hospitalization (including imaging, microbiology, ancillary and laboratory) are listed below for reference.     Microbiology: Recent Results (from the past 240 hour(s))  Blood Culture (routine x 2)     Status: None (Preliminary result)   Collection Time: 08/12/22  4:12 PM   Specimen: BLOOD  Result Value Ref Range Status   Specimen Description BLOOD BLOOD RIGHT HAND  Final   Special Requests   Final    BOTTLES DRAWN AEROBIC AND ANAEROBIC Blood Culture adequate volume   Culture   Final    NO GROWTH 3 DAYS Performed at [facility], [address] [place], [place] [address]    Report Status PENDING  Incomplete  Resp panel by RT-PCR (RSV, Flu A&B, Covid) Anterior Nasal Swab     Status: None   Collection Time: 08/12/22  4:13 PM   Specimen: Anterior Nasal Swab  Result Value Ref Range Status   SARS Coronavirus 2 by RT PCR NEGATIVE NEGATIVE Final    Comment: (NOTE) SARS-CoV-2 target nucleic acids are NOT DETECTED.  The SARS-CoV-2  RNA is generally detectable in upper respiratory specimens during the acute phase of infection. The lowest concentration of SARS-CoV-2 viral copies this assay can detect is 138 copies/mL. A negative result does not preclude SARS-Cov-2 infection and should not be used as the sole basis for treatment or other Ann Silva management decisions. A negative result may occur with  improper specimen collection/handling, submission of specimen other than nasopharyngeal swab, presence of viral mutation(s) within the areas targeted by this  assay, and inadequate number of viral copies(<138 copies/mL). A negative result must be combined with clinical observations, Ann Silva history, and epidemiological information. The expected result is Negative.  Fact Sheet for Patients:  EntrepreneurPulse.com.au  Fact Sheet for Healthcare Providers:  IncredibleEmployment.be  This test is no t yet approved or cleared by the [place] FDA and  has been authorized for detection and/or diagnosis of SARS-CoV-2 by FDA under an Emergency Use Authorization (EUA). This EUA will remain  in effect (meaning this test can be used) for the duration of the COVID-19 declaration under Section 564(b)(1) of the Act, 21 U.S.C.section 360bbb-3(b)(1), unless the authorization is terminated  or revoked sooner.       Influenza A by PCR NEGATIVE NEGATIVE Final   Influenza B by PCR NEGATIVE NEGATIVE Final    Comment: (NOTE) The Xpert Xpress SARS-CoV-2/FLU/RSV plus assay is intended as an aid in the diagnosis of influenza from Nasopharyngeal swab specimens and should not be used as a sole basis for treatment. Nasal washings and aspirates are unacceptable for Xpert Xpress SARS-CoV-2/FLU/RSV testing.  Fact Sheet for Patients: EntrepreneurPulse.com.au  Fact Sheet for Healthcare Providers: IncredibleEmployment.be  This test is not yet approved or cleared by the  [place] FDA and has been authorized for detection and/or diagnosis of SARS-CoV-2 by FDA under an Emergency Use Authorization (EUA). This EUA will remain in effect (meaning this test can be used) for the duration of the COVID-19 declaration under Section 564(b)(1) of the Act, 21 U.S.C. section 360bbb-3(b)(1), unless the authorization is terminated or revoked.     Resp Syncytial Virus by PCR NEGATIVE NEGATIVE Final    Comment: (NOTE) Fact Sheet for Patients: EntrepreneurPulse.com.au  Fact Sheet for Healthcare Providers: IncredibleEmployment.be  This test is not yet approved or cleared by the [place] FDA and has been authorized for detection and/or diagnosis of SARS-CoV-2 by FDA under an Emergency Use Authorization (EUA). This EUA will remain in effect (meaning this test can be used) for the duration of the COVID-19 declaration under Section 564(b)(1) of the Act, 21 U.S.C. section 360bbb-3(b)(1), unless the authorization is terminated or revoked.  Performed at [facility], [address], [place], [place] [address]   Urine Culture     Status: Abnormal   Collection Time: 08/12/22  4:13 PM   Specimen: Urine, Catheterized  Result Value Ref Range Status   Specimen Description   Final    URINE, CATHETERIZED Performed at [facility], [place] [address], [place], [place] [address]    Special Requests   Final    NONE Reflexed from [id number] Performed at [facility], [place], [place] [address]    Culture >=100,000 COLONIES/mL ESCHERICHIA COLI (A)  Final   Report Status 08/15/2022 FINAL  Final   Organism ID, Bacteria ESCHERICHIA COLI (A)  Final      Susceptibility   Escherichia coli - MIC*    AMPICILLIN >=32 RESISTANT Resistant     CEFAZOLIN <=4 SENSITIVE Sensitive     CEFEPIME <=0.12 SENSITIVE Sensitive     CEFTRIAXONE <=0.25 SENSITIVE Sensitive     CIPROFLOXACIN <=0.25 SENSITIVE Sensitive      GENTAMICIN <=1 SENSITIVE Sensitive     IMIPENEM <=0.25 SENSITIVE Sensitive     NITROFURANTOIN <=16 SENSITIVE Sensitive     TRIMETH/SULFA <=20 SENSITIVE Sensitive     AMPICILLIN/SULBACTAM 16 INTERMEDIATE Intermediate     PIP/TAZO <=4 SENSITIVE Sensitive     * >=100,000 COLONIES/mL ESCHERICHIA COLI  Blood Culture (routine x 2)  Status: None (Preliminary result)   Collection Time: 08/12/22  4:14 PM   Specimen: BLOOD  Result Value Ref Range Status   Specimen Description BLOOD BLOOD RIGHT FOREARM  Final   Special Requests   Final    BOTTLES DRAWN AEROBIC AND ANAEROBIC Blood Culture adequate volume   Culture   Final    NO GROWTH 3 DAYS Performed at [facility], [address] [place], [place] [address]    Report Status PENDING  Incomplete  MRSA Next Gen by PCR, Nasal     Status: None   Collection Time: 08/12/22 10:39 PM   Specimen: Nasal Mucosa; Nasal Swab  Result Value Ref Range Status   MRSA by PCR Next Gen NOT DETECTED NOT DETECTED Final    Comment: (NOTE) The GeneXpert MRSA Assay (FDA approved for NASAL specimens only), is one component of a comprehensive MRSA colonization surveillance program. It is not intended to diagnose MRSA infection nor to guide or monitor treatment for MRSA infections. Test performance is not FDA approved in patients less than 10 years old. Performed at [facility], [address] [place], [place] [address]      Labs: BNP (last 3 results) Recent Labs    05/19/22 1348 08/12/22 1613  BNP 377.8* 94.4   Basic Metabolic Panel: Recent Labs  Lab 08/12/22 1613 08/13/22 0456 08/14/22 0449 08/15/22 0936  NA 135 141 137  --   K 3.6 3.9 4.1  --   CL 101 108 107  --   CO2 '24 23 24  '$ --   GLUCOSE 111* 97 114*  --   BUN '21 17 18  '$ --   CREATININE 1.06* 0.75 1.14* 0.66  CALCIUM 10.4* 8.5* 10.2  --   MG  --   --  2.2  --    Liver Function Tests: Recent Labs  Lab 08/12/22 1613 08/14/22 0449  AST 21 19  ALT 19 18  ALKPHOS  108 104  BILITOT 0.7 0.6  PROT 7.3 6.8  ALBUMIN 3.1* 2.7*   No results for input(s): "LIPASE", "AMYLASE" in the last 168 hours. No results for input(s): "AMMONIA" in the last 168 hours. CBC: Recent Labs  Lab 08/12/22 1613 08/13/22 0456 08/14/22 0449  WBC 13.8* 11.0* 9.8  NEUTROABS 12.4*  --  7.4  HGB 13.4 12.2 12.3  HCT 40.9 38.0 38.5  MCV 93.4 96.4 94.8  PLT 276 247 270   Cardiac Enzymes: No results for input(s): "CKTOTAL", "CKMB", "CKMBINDEX", "TROPONINI" in the last 168 hours. BNP: Invalid input(s): "POCBNP" CBG: No results for input(s): "GLUCAP" in the last 168 hours. D-Dimer No results for input(s): "DDIMER" in the last 72 hours. Hgb A1c No results for input(s): "HGBA1C" in the last 72 hours. Lipid Profile No results for input(s): "CHOL", "HDL", "LDLCALC", "TRIG", "CHOLHDL", "LDLDIRECT" in the last 72 hours. Thyroid function studies No results for input(s): "TSH", "T4TOTAL", "T3FREE", "THYROIDAB" in the last 72 hours.  Invalid input(s): "FREET3" Anemia work up No results for input(s): "VITAMINB12", "FOLATE", "FERRITIN", "TIBC", "IRON", "RETICCTPCT" in the last 72 hours. Urinalysis    Component Value Date/Time   COLORURINE YELLOW (A) 08/12/2022 1613   APPEARANCEUR CLOUDY (A) 08/12/2022 1613   APPEARANCEUR Cloudy (A) 06/28/2019 1558   LABSPEC 1.013 08/12/2022 1613   LABSPEC 1.009 03/15/2012 2143   PHURINE 6.0 08/12/2022 1613   GLUCOSEU NEGATIVE 08/12/2022 1613   GLUCOSEU Negative 03/15/2012 2143   HGBUR MODERATE (A) 08/12/2022 1613   BILIRUBINUR NEGATIVE 08/12/2022 1613   BILIRUBINUR Negative 06/28/2019  [place] Negative 03/15/2012 2143   KETONESUR NEGATIVE 08/12/2022 1613   PROTEINUR 100 (A) 08/12/2022 1613   NITRITE POSITIVE (A) 08/12/2022 1613   LEUKOCYTESUR LARGE (A) 08/12/2022 1613   LEUKOCYTESUR 1+ 03/15/2012 2143   Sepsis Labs Recent Labs  Lab 08/12/22 1613 08/13/22 0456 08/14/22 0449  WBC 13.8* 11.0* 9.8   Microbiology Recent  Results (from the past 240 hour(s))  Blood Culture (routine x 2)     Status: None (Preliminary result)   Collection Time: 08/12/22  4:12 PM   Specimen: BLOOD  Result Value Ref Range Status   Specimen Description BLOOD BLOOD RIGHT HAND  Final   Special Requests   Final    BOTTLES DRAWN AEROBIC AND ANAEROBIC Blood Culture adequate volume   Culture   Final    NO GROWTH 3 DAYS Performed at [facility], [address], [place], [place] [address]    Report Status PENDING  Incomplete  Resp panel by RT-PCR (RSV, Flu A&B, Covid) Anterior Nasal Swab     Status: None   Collection Time: 08/12/22  4:13 PM   Specimen: Anterior Nasal Swab  Result Value Ref Range Status   SARS Coronavirus 2 by RT PCR NEGATIVE NEGATIVE Final    Comment: (NOTE) SARS-CoV-2 target nucleic acids are NOT DETECTED.  The SARS-CoV-2 RNA is generally detectable in upper respiratory specimens during the acute phase of infection. The lowest concentration of SARS-CoV-2 viral copies this assay can detect is 138 copies/mL. A negative result does not preclude SARS-Cov-2 infection and should not be used as the sole basis for treatment or other Ann Silva management decisions. A negative result may occur with  improper specimen collection/handling, submission of specimen other than nasopharyngeal swab, presence of viral mutation(s) within the areas targeted by this assay, and inadequate number of viral copies(<138 copies/mL). A negative result must be combined with clinical observations, Ann Silva history, and epidemiological information. The expected result is Negative.  Fact Sheet for Patients:  EntrepreneurPulse.com.au  Fact Sheet for Healthcare Providers:  IncredibleEmployment.be  This test is no t yet approved or cleared by the [place] FDA and  has been authorized for detection and/or diagnosis of SARS-CoV-2 by FDA under an Emergency Use Authorization (EUA). This EUA will  remain  in effect (meaning this test can be used) for the duration of the COVID-19 declaration under Section 564(b)(1) of the Act, 21 U.S.C.section 360bbb-3(b)(1), unless the authorization is terminated  or revoked sooner.       Influenza A by PCR NEGATIVE NEGATIVE Final   Influenza B by PCR NEGATIVE NEGATIVE Final    Comment: (NOTE) The Xpert Xpress SARS-CoV-2/FLU/RSV plus assay is intended as an aid in the diagnosis of influenza from Nasopharyngeal swab specimens and should not be used as a sole basis for treatment. Nasal washings and aspirates are unacceptable for Xpert Xpress SARS-CoV-2/FLU/RSV testing.  Fact Sheet for Patients: EntrepreneurPulse.com.au  Fact Sheet for Healthcare Providers: IncredibleEmployment.be  This test is not yet approved or cleared by the [place] FDA and has been authorized for detection and/or diagnosis of SARS-CoV-2 by FDA under an Emergency Use Authorization (EUA). This EUA will remain in effect (meaning this test can be used) for the duration of the COVID-19 declaration under Section 564(b)(1) of the Act, 21 U.S.C. section 360bbb-3(b)(1), unless the authorization is terminated or revoked.     Resp Syncytial Virus by PCR NEGATIVE NEGATIVE Final    Comment: (NOTE) Fact Sheet for Patients: EntrepreneurPulse.com.au  Fact Sheet for Healthcare Providers:  IncredibleEmployment.be  This test is not yet approved or cleared by the [place] and has been authorized for detection and/or diagnosis of SARS-CoV-2 by FDA under an Emergency Use Authorization (EUA). This EUA will remain in effect (meaning this test can be used) for the duration of the COVID-19 declaration under Section 564(b)(1) of the Act, 21 U.S.C. section 360bbb-3(b)(1), unless the authorization is terminated or revoked.  Performed at [facility], [address], [place], [place] [address]    Urine Culture     Status: Abnormal   Collection Time: 08/12/22  4:13 PM   Specimen: Urine, Catheterized  Result Value Ref Range Status   Specimen Description   Final    URINE, CATHETERIZED Performed at [facility], [place] [address], [place], [place] [address]    Special Requests   Final    NONE Reflexed from [id number] Performed at [facility], [address] [place], [place] [address]    Culture >=100,000 COLONIES/mL ESCHERICHIA COLI (A)  Final   Report Status 08/15/2022 FINAL  Final   Organism ID, Bacteria ESCHERICHIA COLI (A)  Final      Susceptibility   Escherichia coli - MIC*    AMPICILLIN >=32 RESISTANT Resistant     CEFAZOLIN <=4 SENSITIVE Sensitive     CEFEPIME <=0.12 SENSITIVE Sensitive     CEFTRIAXONE <=0.25 SENSITIVE Sensitive     CIPROFLOXACIN <=0.25 SENSITIVE Sensitive     GENTAMICIN <=1 SENSITIVE Sensitive     IMIPENEM <=0.25 SENSITIVE Sensitive     NITROFURANTOIN <=16 SENSITIVE Sensitive     TRIMETH/SULFA <=20 SENSITIVE Sensitive     AMPICILLIN/SULBACTAM 16 INTERMEDIATE Intermediate     PIP/TAZO <=4 SENSITIVE Sensitive     * >=100,000 COLONIES/mL ESCHERICHIA COLI  Blood Culture (routine x 2)     Status: None (Preliminary result)   Collection Time: 08/12/22  4:14 PM   Specimen: BLOOD  Result Value Ref Range Status   Specimen Description BLOOD BLOOD RIGHT FOREARM  Final   Special Requests   Final    BOTTLES DRAWN AEROBIC AND ANAEROBIC Blood Culture adequate volume   Culture   Final    NO GROWTH 3 DAYS Performed at [facility], [address], [place], [place] [address]    Report Status PENDING  Incomplete  MRSA Next Gen by PCR, Nasal     Status: None   Collection Time: 08/12/22 10:39 PM   Specimen: Nasal Mucosa; Nasal Swab  Result Value Ref Range Status   MRSA by PCR Next Gen NOT DETECTED NOT DETECTED Final    Comment: (NOTE) The GeneXpert MRSA Assay (FDA approved for NASAL specimens only), is one component of a comprehensive MRSA  colonization surveillance program. It is not intended to diagnose MRSA infection nor to guide or monitor treatment for MRSA infections. Test performance is not FDA approved in patients less than 58 years old. Performed at [facility], [address], [place], [place] [address]      Time coordinating discharge: Over 30 minutes  SIGNED:   Shawna Clamp, MD  [facility] 08/15/2022, 4:27 PM Pager   If 7PM-7AM, please contact night-coverage

## 2022-08-17 LAB — CULTURE, BLOOD (ROUTINE X 2)
Culture: NO GROWTH
Culture: NO GROWTH
Special Requests: ADEQUATE
Special Requests: ADEQUATE

## 2023-01-22 DIAGNOSIS — F330 Major depressive disorder, recurrent, mild: Secondary | ICD-10-CM | POA: Insufficient documentation

## 2023-02-22 ENCOUNTER — Emergency Department: Payer: Medicare PPO

## 2023-02-22 ENCOUNTER — Other Ambulatory Visit: Payer: Self-pay

## 2023-02-22 ENCOUNTER — Inpatient Hospital Stay
Admission: EM | Admit: 2023-02-22 | Discharge: 2023-02-26 | DRG: 291 | Disposition: A | Discharge status: Home Health | Payer: Medicare PPO | Attending: Internal Medicine | Admitting: Internal Medicine

## 2023-02-22 DIAGNOSIS — J449 Chronic obstructive pulmonary disease, unspecified: Secondary | ICD-10-CM | POA: Diagnosis present

## 2023-02-22 DIAGNOSIS — Z6841 Body Mass Index (BMI) 40.0 and over, adult: Secondary | ICD-10-CM | POA: Diagnosis not present

## 2023-02-22 DIAGNOSIS — Z87891 Personal history of nicotine dependence: Secondary | ICD-10-CM | POA: Diagnosis not present

## 2023-02-22 DIAGNOSIS — Z833 Family history of diabetes mellitus: Secondary | ICD-10-CM

## 2023-02-22 DIAGNOSIS — I110 Hypertensive heart disease with heart failure: Principal | ICD-10-CM | POA: Diagnosis present

## 2023-02-22 DIAGNOSIS — R0602 Shortness of breath: Secondary | ICD-10-CM | POA: Diagnosis present

## 2023-02-22 DIAGNOSIS — Z86711 Personal history of pulmonary embolism: Secondary | ICD-10-CM | POA: Diagnosis not present

## 2023-02-22 DIAGNOSIS — F410 Panic disorder [episodic paroxysmal anxiety] without agoraphobia: Secondary | ICD-10-CM | POA: Diagnosis present

## 2023-02-22 DIAGNOSIS — I5043 Acute on chronic combined systolic (congestive) and diastolic (congestive) heart failure: Secondary | ICD-10-CM | POA: Diagnosis present

## 2023-02-22 DIAGNOSIS — I2510 Atherosclerotic heart disease of native coronary artery without angina pectoris: Secondary | ICD-10-CM | POA: Diagnosis present

## 2023-02-22 DIAGNOSIS — Z7901 Long term (current) use of anticoagulants: Secondary | ICD-10-CM

## 2023-02-22 DIAGNOSIS — I272 Pulmonary hypertension, unspecified: Secondary | ICD-10-CM | POA: Diagnosis present

## 2023-02-22 DIAGNOSIS — Z823 Family history of stroke: Secondary | ICD-10-CM | POA: Diagnosis not present

## 2023-02-22 DIAGNOSIS — J9601 Acute respiratory failure with hypoxia: Secondary | ICD-10-CM | POA: Diagnosis present

## 2023-02-22 DIAGNOSIS — I509 Heart failure, unspecified: Secondary | ICD-10-CM

## 2023-02-22 DIAGNOSIS — E876 Hypokalemia: Secondary | ICD-10-CM | POA: Diagnosis present

## 2023-02-22 DIAGNOSIS — G2581 Restless legs syndrome: Secondary | ICD-10-CM | POA: Diagnosis present

## 2023-02-22 DIAGNOSIS — Z79899 Other long term (current) drug therapy: Secondary | ICD-10-CM | POA: Diagnosis not present

## 2023-02-22 DIAGNOSIS — Z90710 Acquired absence of both cervix and uterus: Secondary | ICD-10-CM

## 2023-02-22 DIAGNOSIS — I5033 Acute on chronic diastolic (congestive) heart failure: Secondary | ICD-10-CM | POA: Diagnosis not present

## 2023-02-22 DIAGNOSIS — I5023 Acute on chronic systolic (congestive) heart failure: Secondary | ICD-10-CM | POA: Diagnosis not present

## 2023-02-22 DIAGNOSIS — F319 Bipolar disorder, unspecified: Secondary | ICD-10-CM | POA: Diagnosis present

## 2023-02-22 DIAGNOSIS — Z85038 Personal history of other malignant neoplasm of large intestine: Secondary | ICD-10-CM

## 2023-02-22 DIAGNOSIS — K219 Gastro-esophageal reflux disease without esophagitis: Secondary | ICD-10-CM | POA: Diagnosis present

## 2023-02-22 DIAGNOSIS — Z818 Family history of other mental and behavioral disorders: Secondary | ICD-10-CM

## 2023-02-22 DIAGNOSIS — Z96643 Presence of artificial hip joint, bilateral: Secondary | ICD-10-CM | POA: Diagnosis present

## 2023-02-22 DIAGNOSIS — E785 Hyperlipidemia, unspecified: Secondary | ICD-10-CM | POA: Diagnosis present

## 2023-02-22 DIAGNOSIS — R5381 Other malaise: Secondary | ICD-10-CM | POA: Diagnosis present

## 2023-02-22 LAB — BASIC METABOLIC PANEL
Anion gap: 11 (ref 5–15)
BUN: 20 mg/dL (ref 8–23)
CO2: 31 mmol/L (ref 22–32)
Calcium: 10.2 mg/dL (ref 8.9–10.3)
Chloride: 94 mmol/L — ABNORMAL LOW (ref 98–111)
Creatinine, Ser: 1.24 mg/dL — ABNORMAL HIGH (ref 0.44–1.00)
GFR, Estimated: 45 mL/min — ABNORMAL LOW (ref >60)
Glucose, Bld: 219 mg/dL — ABNORMAL HIGH (ref 70–99)
Potassium: 2.5 mmol/L — CL (ref 3.5–5.1)
Sodium: 136 mmol/L (ref 135–145)

## 2023-02-22 LAB — PHOSPHORUS: Phosphorus: 2 mg/dL — ABNORMAL LOW (ref 2.5–4.6)

## 2023-02-22 LAB — CBC
HCT: 40.3 % (ref 36.0–46.0)
Hemoglobin: 13 g/dL (ref 12.0–15.0)
MCH: 30.4 pg (ref 26.0–34.0)
MCHC: 32.3 g/dL (ref 30.0–36.0)
MCV: 94.4 fL (ref 80.0–100.0)
Platelets: 333 10*3/uL (ref 150–400)
RBC: 4.27 MIL/uL (ref 3.87–5.11)
RDW: 17 % — ABNORMAL HIGH (ref 11.5–15.5)
WBC: 9.2 10*3/uL (ref 4.0–10.5)
nRBC: 0.2 % (ref 0.0–0.2)

## 2023-02-22 LAB — GLUCOSE, CAPILLARY: Glucose-Capillary: 138 mg/dL — ABNORMAL HIGH (ref 70–99)

## 2023-02-22 LAB — HEMOGLOBIN A1C
Hgb A1c MFr Bld: 6.3 % — ABNORMAL HIGH (ref 4.8–5.6)
Mean Plasma Glucose: 134.11 mg/dL

## 2023-02-22 LAB — TROPONIN I (HIGH SENSITIVITY): Troponin I (High Sensitivity): 43 ng/L — ABNORMAL HIGH (ref <18)

## 2023-02-22 LAB — MAGNESIUM: Magnesium: 2 mg/dL (ref 1.7–2.4)

## 2023-02-22 LAB — BRAIN NATRIURETIC PEPTIDE: B Natriuretic Peptide: 181.3 pg/mL — ABNORMAL HIGH (ref 0.0–100.0)

## 2023-02-22 MED ORDER — BUPROPION HCL 75 MG PO TABS
75.0000 mg | ORAL_TABLET | Freq: Two times a day (BID) | ORAL | Status: DC
  Administered 2023-02-23 – 2023-02-26 (×7): 75 mg via ORAL
  Filled 2023-02-22 (×8): qty 1

## 2023-02-22 MED ORDER — ALBUTEROL SULFATE HFA 108 (90 BASE) MCG/ACT IN AERS: 2.0000 | INHALATION_SPRAY | RESPIRATORY_TRACT | Status: DC | PRN

## 2023-02-22 MED ORDER — ROPINIROLE HCL 1 MG PO TABS
3.0000 mg | ORAL_TABLET | Freq: Once | ORAL | Status: AC
  Administered 2023-02-22: 3 mg via ORAL
  Filled 2023-02-22: qty 3

## 2023-02-22 MED ORDER — ALBUTEROL SULFATE (2.5 MG/3ML) 0.083% IN NEBU: 2.5000 mg | INHALATION_SOLUTION | RESPIRATORY_TRACT | Status: DC | PRN

## 2023-02-22 MED ORDER — TEMAZEPAM 15 MG PO CAPS
30.0000 mg | ORAL_CAPSULE | Freq: Every evening | ORAL | Status: DC | PRN
  Administered 2023-02-22 – 2023-02-25 (×4): 30 mg via ORAL
  Filled 2023-02-22 (×4): qty 2

## 2023-02-22 MED ORDER — PAROXETINE HCL 20 MG PO TABS
40.0000 mg | ORAL_TABLET | Freq: Every day | ORAL | Status: DC
  Administered 2023-02-23 – 2023-02-26 (×4): 40 mg via ORAL
  Filled 2023-02-22 (×5): qty 2

## 2023-02-22 MED ORDER — ROPINIROLE HCL 1 MG PO TABS
3.0000 mg | ORAL_TABLET | Freq: Four times a day (QID) | ORAL | Status: DC
  Administered 2023-02-22 – 2023-02-26 (×15): 3 mg via ORAL
  Filled 2023-02-22 (×15): qty 3

## 2023-02-22 MED ORDER — SODIUM CHLORIDE 0.9% FLUSH: 3.0000 mL | INTRAVENOUS | Status: DC | PRN

## 2023-02-22 MED ORDER — FUROSEMIDE 10 MG/ML IJ SOLN
60.0000 mg | Freq: Two times a day (BID) | INTRAMUSCULAR | Status: DC
  Administered 2023-02-22 – 2023-02-25 (×6): 60 mg via INTRAVENOUS
  Filled 2023-02-22 (×5): qty 6

## 2023-02-22 MED ORDER — SPIRONOLACTONE 25 MG PO TABS
25.0000 mg | ORAL_TABLET | Freq: Every day | ORAL | Status: DC
  Administered 2023-02-23 – 2023-02-26 (×4): 25 mg via ORAL
  Filled 2023-02-22 (×4): qty 1

## 2023-02-22 MED ORDER — IOHEXOL 350 MG/ML SOLN
75.0000 mL | Freq: Once | INTRAVENOUS | Status: AC | PRN
  Administered 2023-02-22: 75 mL via INTRAVENOUS

## 2023-02-22 MED ORDER — ONDANSETRON HCL 4 MG/2ML IJ SOLN: 4.0000 mg | Freq: Four times a day (QID) | INTRAMUSCULAR | Status: DC | PRN

## 2023-02-22 MED ORDER — APIXABAN 5 MG PO TABS
5.0000 mg | ORAL_TABLET | Freq: Two times a day (BID) | ORAL | Status: DC
  Administered 2023-02-22 – 2023-02-26 (×8): 5 mg via ORAL
  Filled 2023-02-22 (×8): qty 1

## 2023-02-22 MED ORDER — INSULIN ASPART 100 UNIT/ML IJ SOLN
0.0000 [IU] | Freq: Three times a day (TID) | INTRAMUSCULAR | Status: DC
  Administered 2023-02-23 – 2023-02-25 (×7): 3 [IU] via SUBCUTANEOUS
  Administered 2023-02-25: 4 [IU] via SUBCUTANEOUS
  Administered 2023-02-26: 3 [IU] via SUBCUTANEOUS
  Filled 2023-02-22 (×10): qty 1

## 2023-02-22 MED ORDER — OXYBUTYNIN CHLORIDE 5 MG PO TABS
5.0000 mg | ORAL_TABLET | Freq: Two times a day (BID) | ORAL | Status: DC
  Administered 2023-02-22: 5 mg via ORAL
  Filled 2023-02-22 (×2): qty 1

## 2023-02-22 MED ORDER — SODIUM CHLORIDE 0.9% FLUSH
3.0000 mL | Freq: Two times a day (BID) | INTRAVENOUS | Status: DC
  Administered 2023-02-22 – 2023-02-26 (×9): 3 mL via INTRAVENOUS

## 2023-02-22 MED ORDER — UMECLIDINIUM BROMIDE 62.5 MCG/ACT IN AEPB
1.0000 | INHALATION_SPRAY | Freq: Every day | RESPIRATORY_TRACT | Status: DC
  Administered 2023-02-23 – 2023-02-26 (×4): 1 via RESPIRATORY_TRACT
  Filled 2023-02-22 (×2): qty 7

## 2023-02-22 MED ORDER — IPRATROPIUM-ALBUTEROL 0.5-2.5 (3) MG/3ML IN SOLN: 3.0000 mL | Freq: Four times a day (QID) | RESPIRATORY_TRACT | Status: DC | PRN

## 2023-02-22 MED ORDER — ACETAMINOPHEN 325 MG PO TABS
650.0000 mg | ORAL_TABLET | ORAL | Status: DC | PRN
  Administered 2023-02-25: 650 mg via ORAL
  Filled 2023-02-22: qty 2

## 2023-02-22 MED ORDER — MONTELUKAST SODIUM 10 MG PO TABS
10.0000 mg | ORAL_TABLET | Freq: Every day | ORAL | Status: DC
  Administered 2023-02-22 – 2023-02-25 (×4): 10 mg via ORAL
  Filled 2023-02-22 (×4): qty 1

## 2023-02-22 MED ORDER — METOPROLOL SUCCINATE ER 50 MG PO TB24
50.0000 mg | ORAL_TABLET | Freq: Every day | ORAL | Status: DC
  Administered 2023-02-23 – 2023-02-26 (×4): 50 mg via ORAL
  Filled 2023-02-22 (×4): qty 1

## 2023-02-22 MED ORDER — ARIPIPRAZOLE 5 MG PO TABS
5.0000 mg | ORAL_TABLET | Freq: Every day | ORAL | Status: DC
  Administered 2023-02-22 – 2023-02-25 (×4): 5 mg via ORAL
  Filled 2023-02-22 (×4): qty 1

## 2023-02-22 MED ORDER — ACETAMINOPHEN 500 MG PO TABS: 1000.0000 mg | ORAL_TABLET | Freq: Four times a day (QID) | ORAL | Status: DC | PRN

## 2023-02-22 MED ORDER — POTASSIUM CHLORIDE CRYS ER 20 MEQ PO TBCR
40.0000 meq | EXTENDED_RELEASE_TABLET | ORAL | Status: AC
  Administered 2023-02-22 (×2): 40 meq via ORAL
  Filled 2023-02-22 (×2): qty 2

## 2023-02-22 MED ORDER — SODIUM CHLORIDE 0.9 % IV SOLN: 250.0000 mL | INTRAVENOUS | Status: DC | PRN

## 2023-02-22 MED ORDER — POTASSIUM CHLORIDE CRYS ER 20 MEQ PO TBCR
40.0000 meq | EXTENDED_RELEASE_TABLET | Freq: Once | ORAL | Status: AC
  Administered 2023-02-22: 40 meq via ORAL
  Filled 2023-02-22: qty 2

## 2023-02-22 MED ORDER — FLUTICASONE FUROATE-VILANTEROL 100-25 MCG/ACT IN AEPB
1.0000 | INHALATION_SPRAY | Freq: Every day | RESPIRATORY_TRACT | Status: DC
  Administered 2023-02-23 – 2023-02-26 (×4): 1 via RESPIRATORY_TRACT
  Filled 2023-02-22 (×2): qty 28

## 2023-02-22 MED ORDER — PANTOPRAZOLE SODIUM 40 MG PO TBEC
40.0000 mg | DELAYED_RELEASE_TABLET | Freq: Every day | ORAL | Status: DC
  Administered 2023-02-22 – 2023-02-26 (×5): 40 mg via ORAL
  Filled 2023-02-22 (×5): qty 1

## 2023-02-22 NOTE — ED Notes (Signed)
Pt to CT

## 2023-02-22 NOTE — H&P (Signed)
History and Physical    Ann Silva WGN:[id number] DOB: [birth year] DOA: 02/22/2023  PCP: Danella Penton, MD (Confirm with patient/family/NH records and if not entered, this has to be entered at [place] point of entry) Patient coming from: Home  I have personally briefly reviewed patient's old medical records in [facility]  Chief Complaint: Leg swelling, COB, chest pain  HPI: Ann Silva is a 77 y.o. female with medical history significant of chronic HFpEF, PE status post thrombectomy November 2023 on chronic Eliquis, COPD Gold stage II, HTN, GERD, spinal stenosis, morbid obesity, bipolar disorder, presented with worsening of bilateral leg swelling with continuing, new onset of shortness of breath and chest pain.  Patient has history of chronic HFpEF on chronic Lasix.  Despite patient has observed increasing bilateral lower extremity swelling and weeping but no ulcers.  Her PCP has increased her Lasix for few weeks without significant improvement and 5 days ago patient was started on torsemide initially was 20 mg daily increased to 40 mg daily for the last couple days but again no significant progress and patient continued to experience worsening of leg swelling and weeping.  She estimated she gained about 40 pounds in last 77-month.  This week she also experienced worsening of exertional dyspnea, even walking inside the house trigger significant shortness of breath.  This morning she woke up with sharp-like chest pain centrally located worsening with deep breath and decided to come to hospital.  ED Course: Blood pressure borderline low, borderline tachycardia O2 saturation 95% on 2 L.  CT angiogram negative for PE but pulmonary hypertension and trace bilateral pleural effusion.  Blood work showed K2.5, creatinine 1.2, glucose 219  Patient was given p.o. KCl x 1  Review of Systems: As per HPI otherwise 14 point review of systems negative.    Past Medical History:  Diagnosis Date    Anemia    takes ferrous sulfate daily   Anxiety    Arthritis    Bladder spasm    Chronic back pain    spinal stenosis   Colon cancer (HCC)    Complication of anesthesia    B/P bottoms out, hard to wake up    Contact dermatitis    COPD (chronic obstructive pulmonary disease) (HCC)    Albuterol inhaler and DuoNeb as needed   COPD (chronic obstructive pulmonary disease) (HCC)    Coronary artery disease    mild by 08/25/09 cath   Foot drop, left    Headache    Heart murmur    never had any problems   History of blood transfusion    no abnormal reaction noted   History of bronchitis    History of MRSA infection 6+ yrs ago   Hypertension    takes Metoprolol daily   Hypertension    Insomnia    takes Trazodone nightly   Joint pain    Joint swelling    Lower extremity edema    Nocturia    Panic attacks    takes Effexor daily   Peripheral edema    takes Lasix daily and takes Bumex every 3 days.Takes Aldactone daily    Pulmonary emboli (HCC) 2018   Restless leg syndrome    takes Requip daily   RLS (restless legs syndrome)    Sleep apnea    doesnt use CPAP    Past Surgical History:  Procedure Laterality Date   ABDOMINAL HYSTERECTOMY     BACK SURGERY     BREAST EXCISIONAL BIOPSY  Left 1980's   neg   CATARACT EXTRACTION W/PHACO Right 10/09/2021   Procedure: CATARACT EXTRACTION PHACO AND INTRAOCULAR LENS PLACEMENT (IOC) RIGHT 23.94 01:52.0;  Surgeon: Galen Manila, MD;  Location: [facility];  Service: Ophthalmology;  Laterality: Right;   CATARACT EXTRACTION W/PHACO Left 10/23/2021   Procedure: CATARACT EXTRACTION PHACO AND INTRAOCULAR LENS PLACEMENT (IOC) LEFT 7.34 00:47.4;  Surgeon: Galen Manila, MD;  Location: [facility];  Service: Ophthalmology;  Laterality: Left;   CHOLECYSTECTOMY     COLON SURGERY     partial colectomy d/t cancer cells   COLONOSCOPY     HIP CLOSED REDUCTION Left 12/11/2020   Procedure: CLOSED REDUCTION HIP;  Surgeon: Kennedy Bucker, MD;  Location: [facility] OR[facility];  Service: Orthopedics;  Laterality: Left;   I&D of left hip      x 4   INTERSTIM IMPLANT PLACEMENT N/A 03/08/2019   Procedure: Leane Platt IMPLANT FIRST AND SECOND STAGE WITH IMPEDENCE CHECK;  Surgeon: Alfredo Martinez, MD;  Location: [facility];  Service: Urology;  Laterality: N/A;   JOINT REPLACEMENT     PULMONARY THROMBECTOMY Right 05/20/2022   Procedure: PULMONARY THROMBECTOMY;  Surgeon: Annice Needy, MD;  Location: [facility];  Service: Cardiovascular;  Laterality: Right;  Bilateral subsegmental   Resection total hip     Revision of total hip arthroplasty     x3-4   right hip replacement     TOTAL HIP ARTHROPLASTY Left      reports that she has quit smoking. Her smoking use included cigarettes. She has never used smokeless tobacco. She reports that she does not drink alcohol and does not use drugs.  Allergies  Allergen Reactions   Hydrocodone-Chlorpheniramine Shortness Of Breath   Sulfa Antibiotics Shortness Of Breath   Sulfa Antibiotics Shortness Of Breath   Chlorhexidine Rash   Methadone Other (See Comments)    Altered Mental Status   Benadryl [Diphenhydramine] Anxiety   Latex Rash   Tape Rash    "the white kind they use after surgery"    Family History  Problem Relation Age of Onset   Cancer Mother    Stroke Father    Diabetes Sister    Mental illness Sister     Prior to Admission medications   Medication Sig Start Date End Date Taking? Authorizing Provider  acetaminophen (TYLENOL) 500 MG tablet Take 1,000 mg by mouth every 6 (six) hours as needed for moderate pain or headache.    [provider]  albuterol (PROVENTIL HFA;VENTOLIN HFA) 108 (90 Base) MCG/ACT inhaler Inhale 2 puffs into the lungs every 4 (four) hours as needed for wheezing or shortness of breath. 12/05/15   Alford Highland, MD  apixaban (ELIQUIS) 5 MG TABS tablet Take 1 tablet (5 mg total) by mouth 2 (two) times daily. Only start this script after  completing eliquis 10mg  BID x 4 days more 05/28/22 08/12/22  Charise Killian, MD  ARIPiprazole (ABILIFY) 5 MG tablet Take 5 mg by mouth at bedtime.    [provider]  buPROPion (WELLBUTRIN) 75 MG tablet Take 75 mg by mouth 2 (two) times daily. Unsure of dose    [provider]  fluticasone-salmeterol (ADVAIR) 250-50 MCG/ACT AEPB Inhale into the lungs. 01/11/22 01/11/23  [provider]  furosemide (LASIX) 20 MG tablet Take 1 tablet (20 mg total) by mouth daily. 04/29/16 02/28/26  Jene Every, MD  ipratropium-albuterol (DUONEB) 0.5-2.5 (3) MG/3ML SOLN Take 3 mLs by nebulization every 6 (six) hours as needed (for wheezing/shortness of  breath).    [provider]  metoprolol succinate (TOPROL-XL) 50 MG 24 hr tablet Take 50 mg by mouth daily. 05/08/22   [provider]  montelukast (SINGULAIR) 10 MG tablet Take 10 mg by mouth daily. 11/16/20   [provider]  omeprazole (PRILOSEC) 40 MG capsule Take 40 mg by mouth daily. 04/02/22   [provider]  oxybutynin (DITROPAN) 5 MG tablet Take 5 mg by mouth 2 (two) times daily. 03/29/22   [provider]  PARoxetine (PAXIL) 40 MG tablet Take 40 mg by mouth daily at 2 PM.     [provider]  rOPINIRole (REQUIP) 3 MG tablet Take 1 tablet (3 mg total) by mouth 4 (four) times daily. 04/20/15   Steele Sizer, MD  spironolactone (ALDACTONE) 25 MG tablet Take 25 mg by mouth daily. 04/18/22   [provider]  temazepam (RESTORIL) 30 MG capsule Take 30 mg by mouth at bedtime as needed. 05/17/22   [provider]  Dwyane Luo 200-62.5-25 MCG/INH AEPB Inhale 1 puff into the lungs daily. 11/17/20   [provider]    Physical Exam: Vitals:   02/22/23 1215 02/22/23 1300 02/22/23 1408 02/22/23 1430  BP: 102/62 100/66 (!) 103/57 (!) 109/50  Pulse: 93 96 92 91  Resp: (!) 25 14 18 15   Temp:      TempSrc:      SpO2: 98% 96% 90% 97%  Weight:      Height:         Constitutional: NAD, calm, comfortable Vitals:   02/22/23 1215 02/22/23 1300 02/22/23 1408 02/22/23 1430  BP: 102/62 100/66 (!) 103/57 (!) 109/50  Pulse: 93 96 92 91  Resp: (!) 25 14 18 15   Temp:      TempSrc:      SpO2: 98% 96% 90% 97%  Weight:      Height:       Eyes: PERRL, lids and conjunctivae normal ENMT: Mucous membranes are moist. Posterior pharynx clear of any exudate or lesions.Normal dentition.  Neck: normal, supple, no masses, no thyromegaly Respiratory: clear to auscultation bilaterally, no wheezing, fine crackles on bilateral bases. Normal respiratory effort. No accessory muscle use.  Cardiovascular: Regular rate and rhythm, no murmurs / rubs / gallops.  Anasarca. 2+ pedal pulses. No carotid bruits.  Abdomen: no tenderness, no masses palpated. No hepatosplenomegaly. Bowel sounds positive.  Musculoskeletal: no clubbing / cyanosis. No joint deformity upper and lower extremities. Good ROM, no contractures. Normal muscle tone.  Skin: no rashes, lesions, ulcers. No induration Neurologic: CN 2-12 grossly intact. Sensation intact, DTR normal. Strength 5/5 in all 4.  Psychiatric: Normal judgment and insight. Alert and oriented x 3. Normal mood.     Labs on Admission: I have personally reviewed following labs and imaging studies  CBC: Recent Labs  Lab 02/22/23 1111  WBC 9.2  HGB 13.0  HCT 40.3  MCV 94.4  PLT 333   Basic Metabolic Panel: Recent Labs  Lab 02/22/23 1111  NA 136  K 2.5*  CL 94*  CO2 31  GLUCOSE 219*  BUN 20  CREATININE 1.24*  CALCIUM 10.2   GFR: Estimated Creatinine Clearance: 38.1 mL/min (A) (by C-G formula based on SCr of 1.24 mg/dL (H)). Liver Function Tests: No results for input(s): "AST", "ALT", "ALKPHOS", "BILITOT", "PROT", "ALBUMIN" in the last 168 hours. No results for input(s): "LIPASE", "AMYLASE" in the last 168 hours. No results for input(s): "AMMONIA" in the last 168 hours. Coagulation Profile: No results for  input(s):  "INR", "PROTIME" in the last 168 hours. Cardiac Enzymes: No results for input(s): "CKTOTAL", "CKMB", "CKMBINDEX", "TROPONINI" in the last 168 hours. BNP (last 3 results) No results for input(s): "PROBNP" in the last 8760 hours. HbA1C: No results for input(s): "HGBA1C" in the last 72 hours. CBG: No results for input(s): "GLUCAP" in the last 168 hours. Lipid Profile: No results for input(s): "CHOL", "HDL", "LDLCALC", "TRIG", "CHOLHDL", "LDLDIRECT" in the last 72 hours. Thyroid Function Tests: No results for input(s): "TSH", "T4TOTAL", "FREET4", "T3FREE", "THYROIDAB" in the last 72 hours. Anemia Panel: No results for input(s): "VITAMINB12", "FOLATE", "FERRITIN", "TIBC", "IRON", "RETICCTPCT" in the last 72 hours. Urine analysis:    Component Value Date/Time   COLORURINE YELLOW (A) 08/12/2022 1613   APPEARANCEUR CLOUDY (A) 08/12/2022 1613   APPEARANCEUR Cloudy (A) 06/28/2019 1558   LABSPEC 1.013 08/12/2022 1613   LABSPEC 1.009 03/15/2012 2143   PHURINE 6.0 08/12/2022 1613   GLUCOSEU NEGATIVE 08/12/2022 1613   GLUCOSEU Negative 03/15/2012 2143   HGBUR MODERATE (A) 08/12/2022 1613   BILIRUBINUR NEGATIVE 08/12/2022 1613   BILIRUBINUR Negative 06/28/2019 1558   BILIRUBINUR Negative 03/15/2012 2143   KETONESUR NEGATIVE 08/12/2022 1613   PROTEINUR 100 (A) 08/12/2022 1613   NITRITE POSITIVE (A) 08/12/2022 1613   LEUKOCYTESUR LARGE (A) 08/12/2022 1613   LEUKOCYTESUR 1+ 03/15/2012 2143    Radiological Exams on Admission: CT Angio Chest PE W and/or Wo Contrast  Result Date: 02/22/2023 CLINICAL DATA:  Shortness of breath and chest pain, concern for pulmonary embolism. EXAM: CT ANGIOGRAPHY CHEST WITH CONTRAST TECHNIQUE: Multidetector CT imaging of the chest was performed using the standard protocol during bolus administration of intravenous contrast. Multiplanar CT image reconstructions and MIPs were obtained to evaluate the vascular anatomy. RADIATION DOSE REDUCTION: This exam was performed  according to the departmental dose-optimization program which includes automated exposure control, adjustment of the mA and/or kV according to patient size and/or use of iterative reconstruction technique. CONTRAST:  75mL OMNIPAQUE IOHEXOL 350 MG/ML SOLN COMPARISON:  Chest radiograph performed the same day and CT chest dated 08/12/2022. FINDINGS: Cardiovascular: Satisfactory opacification of the pulmonary arteries to the segmental level. No evidence of pulmonary embolism. The right and left pulmonary arteries are mildly enlarged, measuring 2.5 cm each, suggestive of pulmonary hypertension. Vascular calcifications are seen in the aortic arch. The ascending thoracic aorta is normal in size. Normal heart size. There is trace pericardial fluid. Mediastinum/Nodes: No enlarged mediastinal, hilar, or axillary lymph nodes. Thyroid gland, trachea, and esophagus demonstrate no significant findings. Lungs/Pleura: Bilateral emphysematous changes. There is mild-to-moderate bilateral dependent atelectasis. No pleural effusion or pneumothorax. A 3 mm solid pulmonary nodule in the left upper lobe is not significantly changed since at least 12/31/2019 and is therefore benign. No imaging follow-up is recommended for this finding. Upper Abdomen: A partially imaged left renal cyst measures 3.0 cm in diameter. No imaging follow-up is recommended for this finding. Musculoskeletal: Degenerative changes are seen in the spine. Review of the MIP images confirms the above findings. IMPRESSION: 1. No evidence of pulmonary embolism. 2. Mild-to-moderate bilateral dependent atelectasis. 3. Mildly enlarged right and left pulmonary arteries, suggestive of pulmonary hypertension. Aortic Atherosclerosis (ICD10-I70.0) and Emphysema (ICD10-J43.9). Electronically Signed   By: Romona Curls M.D.   On: 02/22/2023 14:01   DG Chest 2 View  Result Date: 02/22/2023 CLINICAL DATA:  chest pain EXAM: CHEST - 2 VIEW COMPARISON:  08/12/2022 FINDINGS: The heart  size and mediastinal contours are within normal limits. Both lungs are clear. The visualized skeletal  structures are unremarkable. IMPRESSION: No active cardiopulmonary disease. Electronically Signed   By: Signa Kell M.D.   On: 02/22/2023 12:23    EKG: Independently reviewed.  Chronic RBBB, chronic ST changes on multiple leads  Assessment/Plan Principal Problem:   CHF (congestive heart failure) (HCC) Active Problems:   Acute hypoxemic respiratory failure (HCC)   COPD (chronic obstructive pulmonary disease) (HCC)   Acute congestive heart failure (HCC)  (please populate well all problems here in Problem List. (For example, if patient is on BP meds at home and you resume or decide to hold them, it is a problem that needs to be her. Same for CAD, COPD, HLD and so on)  Acute on chronic HFpEF decompensation -Failed outpatient management -Increase her diuresis starting IV Lasix 60 mg twice daily -Strict INO's and daily weight -Echocardiogram -Wound care for weeping wound  Severe hypokalemia -P.o. replacement, check Mg and phosphorus level -Recheck K level tomorrow  Hyperglycemia -No history of diabetes, check A1c -SSI for now  Hx of PE -CTA negative for PE today -Continue Eliquis  COPD -Stable, continue ICS, LABA and as needed SABA  Morbid obesity -11 kg gain compared to 6 months ago, secondary to CHF decompensated and fluid overload.  Management as above -Outpatient PCP follow-up  Deconditioning -PT evaluation    DVT prophylaxis: Eliquis Code Status: Full code Family Communication: Husband at bedside Disposition Plan: Patient is sick with CHF decompensation failed outpatient management, given her significant fluid overload status, expect difficult diuresis, expect more than 2 midnight hospital stay Consults called: None Admission status: Tele admit   Emeline General MD [facility] Pager [phone]  02/22/2023, 3:03 PM

## 2023-02-22 NOTE — ED Provider Notes (Signed)
[facility] Provider Note    Event Date/Time   First MD Initiated Contact with Patient 02/22/23 1118     (approximate)   History   Shortness of Breath and Chest Pain   HPI  Ann Silva is a 77 y.o. female with a history of COPD, CAD who presents with shortness of breath.  Patient has been seeing her PCP because of lower extremity swelling over the last month.  She was started on torsemide and has been compliant with this.  Over the last 1 to 2 days she has developed shortness of breath worse with lying flat and with exertion.  With exertion she feels some tightness in her chest as well.  No chest pain at this time.  No fevers chills or cough.  Review of medical record demonstrates the patient saw her PCP on August 13, the patient is on Eliquis but I am unable to determine why she is on Eliquis.     Physical Exam   Triage Vital Signs: ED Triage Vitals  Encounter Vitals Group     BP 02/22/23 1113 (!) 105/57     Systolic BP Percentile --      Diastolic BP Percentile --      Pulse Rate 02/22/23 1108 (!) 110     Resp 02/22/23 1108 (!) 25     Temp 02/22/23 1108 98.3 F (36.8 C)     Temp Source 02/22/23 1108 Oral     SpO2 02/22/23 1108 95 %     Weight 02/22/23 1110 96.6 kg (213 lb)     Height 02/22/23 1110 1.499 m (4\' 11" )     Head Circumference --      Peak Flow --      Pain Score --      Pain Loc --      Pain Education --      Exclude from Growth Chart --     Most recent vital signs: Vitals:   02/22/23 1215 02/22/23 1300  BP: 102/62 100/66  Pulse: 93 96  Resp: (!) 25 14  Temp:    SpO2: 98% 96%     General: Awake, no distress.  CV:  Good peripheral perfusion.  Irregular rhythm Resp:  Mild tachypnea, bibasilar Rales Abd:  No distention.  Other:  2+ lower extremity edema bilaterally   ED Results / Procedures / Treatments   Labs (all labs ordered are listed, but only abnormal results are displayed) Labs Reviewed  BASIC METABOLIC  PANEL - Abnormal; Notable for the following components:      Result Value   Potassium 2.5 (*)    Chloride 94 (*)    Glucose, Bld 219 (*)    Creatinine, Ser 1.24 (*)    GFR, Estimated 45 (*)    All other components within normal limits  CBC - Abnormal; Notable for the following components:   RDW 17.0 (*)    All other components within normal limits  BRAIN NATRIURETIC PEPTIDE - Abnormal; Notable for the following components:   B Natriuretic Peptide 181.3 (*)    All other components within normal limits  TROPONIN I (HIGH SENSITIVITY) - Abnormal; Notable for the following components:   Troponin I (High Sensitivity) 43 (*)    All other components within normal limits     EKG  ED ECG REPORT I, Jene Every, the attending physician, personally viewed and interpreted this ECG.  Date: 02/22/2023  Rhythm: Sinus tachycardia QRS Axis: normal Intervals: normal ST/T Wave abnormalities:  normal Narrative Interpretation: no evidence of acute ischemia    RADIOLOGY Chest x-ray viewed interpreted by me, no acute abnormality    PROCEDURES:  Critical Care performed: yes  CRITICAL CARE Performed by: Jene Every   Total critical care time: 30 minutes  Critical care time was exclusive of separately billable procedures and treating other patients.  Critical care was necessary to treat or prevent imminent or life-threatening deterioration.  Critical care was time spent personally by me on the following activities: development of treatment plan with patient and/or surrogate as well as nursing, discussions with consultants, evaluation of patient's response to treatment, examination of patient, obtaining history from patient or surrogate, ordering and performing treatments and interventions, ordering and review of laboratory studies, ordering and review of radiographic studies, pulse oximetry and re-evaluation of patient's condition.   Procedures   MEDICATIONS ORDERED IN  ED: Medications  potassium chloride SA (KLOR-CON M) CR tablet 40 mEq (has no administration in time range)  rOPINIRole (REQUIP) tablet 3 mg (has no administration in time range)  potassium chloride SA (KLOR-CON M) CR tablet 40 mEq (40 mEq Oral Given 02/22/23 1226)  iohexol (OMNIPAQUE) 350 MG/ML injection 75 mL (75 mLs Intravenous Contrast Given 02/22/23 1308)     IMPRESSION / MDM / ASSESSMENT AND PLAN / ED COURSE  I reviewed the triage vital signs and the nursing notes. Patient's presentation is most consistent with acute presentation with potential threat to life or bodily function.  Patient presents with shortness of breath as detailed above.  Differential includes new onset CHF exacerbation, pulmonary edema, pneumonia, COPD exacerbation  Lab work reviewed, patient with significant hypokalemia of 2.5, likely related from diuretics, elevated troponin of 43, elevated BNP of 181, no clear signs of ischemia on x-ray, I suspect this is related to strain from her shortness of breath  CT angiography is negative for pulmonary embolus, signs of possible pulmonary hypertension however.  Have discussed with the hospitalist for admission        FINAL CLINICAL IMPRESSION(S) / ED DIAGNOSES   Final diagnoses:  Hypokalemia  Pulmonary hypertension (HCC)     Rx / DC Orders   ED Discharge Orders     None        Note:  This document was prepared using Dragon voice recognition software and may include unintentional dictation errors.   Jene Every, MD 02/22/23 1435

## 2023-02-22 NOTE — ED Triage Notes (Signed)
Pt to ED [facility] from home for SOB and CP since with exertion since 1 week ago  Was 87% RA per EMS, placed on 4L, not normally on oxygen CP sharp, non radiating. Currently 0/10 pain Pitting edema to lower extremities, both wrapped at PCP on Tuesday Hx PE, echo scheduled for Wed this week Lungs clear per EMS  116 HR, ST, 12 lead unremarkable CBG 258 EMS BP 88/59  Given 324mg  aspirin, 20g L AC Pt is alert and oriented, skin dry Pt states recently finished abx for UTI

## 2023-02-23 DIAGNOSIS — E876 Hypokalemia: Principal | ICD-10-CM

## 2023-02-23 DIAGNOSIS — I272 Pulmonary hypertension, unspecified: Secondary | ICD-10-CM

## 2023-02-23 DIAGNOSIS — I5023 Acute on chronic systolic (congestive) heart failure: Secondary | ICD-10-CM | POA: Diagnosis not present

## 2023-02-23 LAB — BASIC METABOLIC PANEL
Anion gap: 11 (ref 5–15)
Anion gap: 14 (ref 5–15)
BUN: 18 mg/dL (ref 8–23)
BUN: 18 mg/dL (ref 8–23)
CO2: 36 mmol/L — ABNORMAL HIGH (ref 22–32)
CO2: 32 mmol/L (ref 22–32)
Calcium: 10.2 mg/dL (ref 8.9–10.3)
Calcium: 10.7 mg/dL — ABNORMAL HIGH (ref 8.9–10.3)
Chloride: 93 mmol/L — ABNORMAL LOW (ref 98–111)
Chloride: 91 mmol/L — ABNORMAL LOW (ref 98–111)
Creatinine, Ser: 1.1 mg/dL — ABNORMAL HIGH (ref 0.44–1.00)
Creatinine, Ser: 1.01 mg/dL — ABNORMAL HIGH (ref 0.44–1.00)
GFR, Estimated: 51 mL/min — ABNORMAL LOW (ref >60)
GFR, Estimated: 57 mL/min — ABNORMAL LOW (ref >60)
Glucose, Bld: 146 mg/dL — ABNORMAL HIGH (ref 70–99)
Glucose, Bld: 152 mg/dL — ABNORMAL HIGH (ref 70–99)
Potassium: 2.4 mmol/L — CL (ref 3.5–5.1)
Potassium: 3.4 mmol/L — ABNORMAL LOW (ref 3.5–5.1)
Sodium: 140 mmol/L (ref 135–145)
Sodium: 137 mmol/L (ref 135–145)

## 2023-02-23 LAB — GLUCOSE, CAPILLARY
Glucose-Capillary: 127 mg/dL — ABNORMAL HIGH (ref 70–99)
Glucose-Capillary: 141 mg/dL — ABNORMAL HIGH (ref 70–99)
Glucose-Capillary: 150 mg/dL — ABNORMAL HIGH (ref 70–99)
Glucose-Capillary: 162 mg/dL — ABNORMAL HIGH (ref 70–99)

## 2023-02-23 MED ORDER — POTASSIUM CHLORIDE CRYS ER 20 MEQ PO TBCR
40.0000 meq | EXTENDED_RELEASE_TABLET | Freq: Once | ORAL | Status: AC
  Administered 2023-02-23: 40 meq via ORAL
  Filled 2023-02-23: qty 2

## 2023-02-23 MED ORDER — POTASSIUM CHLORIDE CRYS ER 20 MEQ PO TBCR
40.0000 meq | EXTENDED_RELEASE_TABLET | Freq: Two times a day (BID) | ORAL | Status: AC
  Administered 2023-02-23 (×2): 40 meq via ORAL
  Filled 2023-02-23 (×2): qty 2

## 2023-02-23 MED ORDER — POTASSIUM CHLORIDE 10 MEQ/100ML IV SOLN
10.0000 meq | INTRAVENOUS | Status: AC
  Administered 2023-02-23 (×4): 10 meq via INTRAVENOUS
  Filled 2023-02-23 (×4): qty 100

## 2023-02-23 NOTE — Evaluation (Signed)
Occupational Therapy Evaluation Patient Details Name: Ann Silva MRN: [id number] DOB: 09-Jun-1946 Today's Date: 02/23/2023   History of Present Illness Ann Silva is a 77 y.o. female with medical history significant of chronic HFpEF, PE status post thrombectomy November 2023 on chronic Eliquis, COPD Gold stage II, HTN, GERD, spinal stenosis, morbid obesity, bipolar disorder, presented with worsening of bilateral leg swelling with continuing, new onset of shortness of breath and chest pain..   Clinical Impression   Patient received for OT evaluation. See flowsheet below for details of function. Generally, patient requiring supervision-CGA for bed mobility, CGA with RW sidestepping for functional mobility, and CGA-MIN A for ADLs. Pt lethargic throughout session, but oriented and following commands; husband in room. Pt demonstrating weakness in transfers and ADLs.  Patient will benefit from continued OT while in acute care.        If plan is discharge home, recommend the following: A little help with walking and/or transfers;A little help with bathing/dressing/bathroom;Assistance with cooking/housework;Assist for transportation;Help with stairs or ramp for entrance    Functional Status Assessment  Patient has had a recent decline in their functional status and demonstrates the ability to make significant improvements in function in a reasonable and predictable amount of time.  Equipment Recommendations  None recommended by OT    Recommendations for Other Services       Precautions / Restrictions Precautions Precautions: Fall;Other (comment) (watch O2 sats) Restrictions Weight Bearing Restrictions: No      Mobility Bed Mobility Overal bed mobility: Needs Assistance Bed Mobility: Supine to Sit     Supine to sit: Contact guard, HOB elevated, Used rails (lots of extra time)     General bed mobility comments: Lots of increased time and effort to t/f to EOB     Transfers Overall transfer level: Needs assistance Equipment used: 1 person hand held assist Transfers: Sit to/from Stand Sit to Stand: Min assist           General transfer comment: Pt with difficulty gaining balance at EOB; balance on heels; RW helped pt improve balance.      Balance Overall balance assessment: Needs assistance Sitting-balance support: Feet supported, Bilateral upper extremity supported Sitting balance-Leahy Scale: Fair Sitting balance - Comments: intermittent need for cues to lean forward, as pt having slight posterior lean, CGA at times for safety. Postural control: Posterior lean Standing balance support: Reliant on assistive device for balance, Bilateral upper extremity supported Standing balance-Leahy Scale: Poor                             ADL either performed or assessed with clinical judgement   ADL Overall ADL's : Needs assistance/impaired                                       General ADL Comments: Pt able to move UE WFL; anticipate will be able to perform seated ADLs without difficulty; will need assistance when standing, as pt having difficulty maintaining standing balance even with one hand handheld assist. Eventually switching to use of RW and better able to gain balance; OT maintained CGA while pt sidestepping to recliner for safety.  Will require only seated ADLs at this time.     Vision Patient Visual Report: No change from baseline       Perception  Praxis         Pertinent Vitals/Pain Pain Assessment Pain Assessment: No/denies pain     Extremity/Trunk Assessment Upper Extremity Assessment Upper Extremity Assessment: Generalized weakness   Lower Extremity Assessment Lower Extremity Assessment: Defer to PT evaluation;Generalized weakness       Communication Communication Communication: No apparent difficulties   Cognition Arousal: Lethargic Behavior During Therapy: Flat affect, WFL  for tasks assessed/performed Overall Cognitive Status: Within Functional Limits for tasks assessed                                 General Comments: Pt is A+Ox4, although unable to give exact date (does know day of the week, month, and year).     General Comments  Pt on 2L O2 throughout session (does not wear O2 at home); 93-95% saturation at rest, HR 78. While seated EOB sats 92-93% HR 83. Pt denies shortness of breath.    Exercises     Shoulder Instructions      Home Living Family/patient expects to be discharged to:: Private residence Living Arrangements: Spouse/significant other Available Help at Discharge: Family;Available 24 hours/day Type of Home: Mobile home Home Access: Stairs to enter Entrance Stairs-Number of Steps: 1 Entrance Stairs-Rails: Right;Left Home Layout: One level     Bathroom Shower/Tub: Producer, television/film/video: Standard     Home Equipment: Rollator (4 wheels);Cane - single point;BSC/3in1;Shower seat;Grab bars - toilet;Grab bars - tub/shower;Other (comment) (3ww)   Additional Comments: Pt not using AD for mobility prior to admission      Prior Functioning/Environment Prior Level of Function : Independent/Modified Independent (does not drive)             Mobility Comments: Pt states she was not using AD prior to admission. Denies falls. (Husband at bedside corroborating.) Pt states she cannot walk very far; does not go to grocery store with husband. 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 ADLs Comments: (I) BADLs. 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 (I) medication management. Husband does household tasks (cooking, Pharmacologist). Pt does not drive.        OT Problem List: Decreased strength;Impaired balance (sitting and/or standing);Decreased activity tolerance      OT Treatment/Interventions: Self-care/ADL training;Therapeutic exercise;Patient/family  education;Therapeutic activities;DME and/or AE instruction    OT Goals(Current goals can be found in the care plan section) Acute Rehab OT Goals Patient Stated Goal: get better OT Goal Formulation: With patient/family Time For Goal Achievement: 03/09/23 Potential to Achieve Goals: Good ADL Goals Pt Will Perform Grooming: with modified independence;standing Pt Will Perform Lower Body Dressing: with modified independence;sit to/from stand Pt Will Transfer to Toilet: with modified independence;regular height toilet Pt Will Perform Toileting - Clothing Manipulation and hygiene: with modified independence;sit to/from stand  OT Frequency: Min 1X/week    Co-evaluation              AM-PAC OT "6 Clicks" Daily Activity     Outcome Measure Help from another person eating meals?: None Help from another person taking care of personal grooming?: A Little Help from another person toileting, which includes using toliet, bedpan, or urinal?: A Little Help from another person bathing (including washing, rinsing, drying)?: A Little Help from another person to put on and taking off regular upper body clothing?: None Help from another person to put on and taking off  regular lower body clothing?: A Little 6 Click Score: 20   End of Session Equipment Utilized During Treatment: Rolling walker (2 wheels) Nurse Communication: Mobility status;Other (comment) (no chair alarm in chair)  Activity Tolerance: Patient limited by lethargy Patient left: in chair;with call bell/phone within reach;with family/visitor present (RN aware no chair alarm in chair)  OT Visit Diagnosis: Unsteadiness on feet (R26.81);Muscle weakness (generalized) (M62.81)                Time: 3086-5784 OT Time Calculation (min): 24 min Charges:  OT General Charges $OT Visit: 1 Visit OT Evaluation $OT Eval Moderate Complexity: 1 Mod  Eriyah Fernando Junie Panning, MS, OTR/L  Alvester Morin 02/23/2023, 12:10 PM

## 2023-02-23 NOTE — Evaluation (Signed)
Physical Therapy Evaluation Patient Details Name: Ann Silva MRN: [id number] DOB: 21-May-1946 Today's Date: 02/23/2023  History of Present Illness  Patient is a 77 year old female with complaints of leg swelling, chest pain, shortness of breath.  Clinical Impression  Patient is agreeable to PT. She was lethargic throughout session and needed cues to remain alert and participate. She reports she is independent at baseline and lives with her spouse.  The patient was able to stand and take several side steps along edge of bed with steadying assistance provided. Patient declined walking due to fatigue and lethargy. Recommend to continue PT while in the hospital to maximize independence and facilitate return to prior level of function.     If plan is discharge home, recommend the following: A little help with walking and/or transfers;A little help with bathing/dressing/bathroom;Help with stairs or ramp for entrance;Assist for transportation   Can travel by private vehicle        Equipment Recommendations None recommended by PT  Recommendations for Other Services       Functional Status Assessment Patient has had a recent decline in their functional status and demonstrates the ability to make significant improvements in function in a reasonable and predictable amount of time.     Precautions / Restrictions Precautions Precautions: Fall Restrictions Weight Bearing Restrictions: No      Mobility  Bed Mobility Overal bed mobility: Needs Assistance Bed Mobility: Supine to Sit, Sit to Supine     Supine to sit: Contact guard Sit to supine: Contact guard assist   General bed mobility comments: increased time required    Transfers Overall transfer level: Needs assistance Equipment used: 1 person hand held assist Transfers: Sit to/from Stand Sit to Stand: Min assist           General transfer comment: cues for technique    Ambulation/Gait             Pre-gait  activities: patient able to take several side steps to the right with steadying assistance. she declined walking due to lethargy and wanting to eat breakfast    Stairs            Wheelchair Mobility     Tilt Bed    Modified Rankin (Stroke Patients Only)       Balance Overall balance assessment: Needs assistance Sitting-balance support: Feet supported, Bilateral upper extremity supported Sitting balance-Leahy Scale: Fair     Standing balance support: Reliant on assistive device for balance, Bilateral upper extremity supported Standing balance-Leahy Scale: Poor                               Pertinent Vitals/Pain Pain Assessment Pain Assessment: No/denies pain    Home Living Family/patient expects to be discharged to:: Private residence Living Arrangements: Spouse/significant other Available Help at Discharge: Family;Available 24 hours/day Type of Home: Mobile home Home Access: Stairs to enter Entrance Stairs-Rails: Right;Left Entrance Stairs-Number of Steps: 1   Home Layout: One level Home Equipment: Rollator (4 wheels);Cane - single point;BSC/3in1;Shower seat;Grab bars - toilet;Grab bars - tub/shower;Other (comment) Additional Comments: Pt not using AD for mobility prior to admission    Prior Function Prior Level of Function : Independent/Modified Independent             Mobility Comments: patient reports no assistive device ADLs Comments: (I) BADLs. 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 (I) medication management. Husband does household tasks (cooking, Pharmacologist). Pt does not  drive.     Extremity/Trunk Assessment   Upper Extremity Assessment Upper Extremity Assessment: Generalized weakness    Lower Extremity Assessment Lower Extremity Assessment: Generalized weakness       Communication   Communication Communication: No apparent difficulties  Cognition Arousal: Lethargic Behavior During Therapy: Flat affect, WFL for tasks  assessed/performed Overall Cognitive Status: Within Functional Limits for tasks assessed                                 General Comments: very groggy during session, needs stimulation to remain alert        General Comments General comments (skin integrity, edema, etc.): Pt on 2L O2 throughout session (does not wear O2 at home); 93-95% saturation at rest, HR 78. While seated EOB sats 92-93% HR 83. Pt denies shortness of breath.    Exercises     Assessment/Plan    PT Assessment Patient needs continued PT services  PT Problem List Decreased strength;Decreased range of motion;Decreased activity tolerance;Decreased balance;Decreased mobility;Decreased safety awareness       PT Treatment Interventions DME instruction;Gait training;Stair training;Functional mobility training;Therapeutic activities;Therapeutic exercise;Balance training;Neuromuscular re-education;Cognitive remediation;Patient/family education    PT Goals (Current goals can be found in the Care Plan section)  Acute Rehab PT Goals Patient Stated Goal: to go home PT Goal Formulation: With patient Time For Goal Achievement: 03/09/23 Potential to Achieve Goals: Good    Frequency Min 1X/week     Co-evaluation               AM-PAC PT "6 Clicks" Mobility  Outcome Measure Help needed turning from your back to your side while in a flat bed without using bedrails?: A Little Help needed moving from lying on your back to sitting on the side of a flat bed without using bedrails?: A Little Help needed moving to and from a bed to a chair (including a wheelchair)?: A Little Help needed standing up from a chair using your arms (e.g., wheelchair or bedside chair)?: A Little Help needed to walk in hospital room?: A Little Help needed climbing 3-5 steps with a railing? : A Lot 6 Click Score: 17    End of Session   Activity Tolerance: Patient tolerated treatment well Patient left: in bed;with call bell/phone  within reach;with bed alarm set Nurse Communication: Mobility status PT Visit Diagnosis: Unsteadiness on feet (R26.81);Muscle weakness (generalized) (M62.81)    Time: 1610-9604 PT Time Calculation (min) (ACUTE ONLY): 13 min   Charges:   PT Evaluation $PT Eval Low Complexity: 1 Low   PT General Charges $$ ACUTE PT VISIT: 1 Visit         Donna Bernard, PT, MPT   Ina Homes 02/23/2023, 2:42 PM

## 2023-02-23 NOTE — Consult Note (Signed)
WOC Nurse Consult Note: Reason for Consult:bilateral LEs with weeping dermatitis, no wounds Wound type:venous insufficiency Pressure Injury POA: N/A Measurement:N/A Wound bed:N/A Drainage (amount, consistency, odor) serous Periwound:erythema, edema Dressing procedure/placement/frequency:I have provided nursing with conservative topical care for the LEs using a daily soap and water cleanse, rinse and dry followed by the application of antimicrobial nonadherent gauze (xeroform) applied to the areas of weeping. This is to be topped with ABD pads ad secured with Kerlix roll gauze applied from just below toes to just below knees. The Kerlix is to be topped with a 6-inch ACE bandage applied in a similar manner (toe to knee). Feet are to be placed into Prevalon boots and a sacral foam placed to the sacrum for PI prevention.  WOC nursing team will not follow, but will remain available to this patient, the nursing and medical teams.  Please re-consult if needed.  Thank you for inviting [place] to participate in this patient's Plan of Care.  Ladona Mow, MSN, RN, CNS, GNP, Leda Min, [employer], [employer] phone:  [phone]

## 2023-02-23 NOTE — Progress Notes (Signed)
PROGRESS NOTE  Ann Silva    DOB: 03/22/1946, 77 y.o.  ZOX:[id number]    Code Status: Full Code   DOA: 02/22/2023   LOS: 1   Brief hospital course  Ann Silva is a 77 y.o. female with a PMH significant for  chronic HFpEF, PE status post thrombectomy November 2023 on chronic Eliquis, COPD Gold stage II, HTN, GERD, spinal stenosis, morbid obesity, bipolar disorder .  They presented from home to the ED on 02/22/2023 with LEE, SOB, CP x a couple days. Outpatient management with increased lasix was attempted and did not improve symptoms. Woke up with sharp chest pain that prompted her to come to the hospital.   In the ED, it was found that they had Blood pressure borderline low, borderline tachycardia O2 saturation 95% on 2 L.  Significant findings included: CT angiogram negative for PE but pulmonary hypertension and trace bilateral pleural effusion present. Blood work showed K+ 2.5, creatinine 1.2, glucose 219 .  They were initially treated with K+.   Patient was admitted to medicine service for further workup and management of acute on chronic HFpEF exacerbation as outlined in detail below.  02/23/23 -stable, improved  Assessment & Plan  Principal Problem:   CHF (congestive heart failure) (HCC) Active Problems:   Acute hypoxemic respiratory failure (HCC)   COPD (chronic obstructive pulmonary disease) (HCC)   Acute congestive heart failure (HCC)  Acute on chronic HFpEF exacerbation- BNP 181.3  - continue IV diuresis - strict I/O - daily weights - compression stockings - ambulate as tolerated - f/u echo - wean to room air. On 2L [place] and no oxygen requirement at baseline   Severe hypokalemia- improving with supplement but resistant given the diuresis. K+ 2.4>3.4. Mg++ 2.0 -monitor and replete PRN  Hx of PE -CTA negative for PE on admission -Continue Eliquis   COPD- no wheezing -Stable, continue ICS, LABA and as needed SABA   Morbid obesity -11 kg gain compared to 6  months ago, secondary to CHF decompensated and fluid overload.  Management as above -Outpatient PCP follow-up   Deconditioning -PT evaluation  Body mass index is 43.02 kg/m.  VTE ppx:  apixaban (ELIQUIS) tablet 5 mg   Diet:     Diet   Diet Heart Room service appropriate? Yes; Fluid consistency: Thin; Fluid restriction: 2000 mL Fluid   Consultants: None   Subjective 02/23/23    Pt reports feeling well today. Denies SOB at rest. No chest pain.    Objective   Vitals:   02/22/23 1608 02/22/23 2005 02/23/23 0324 02/23/23 0733  BP: 125/64 (!) 148/71 122/76 123/61  Pulse: 91 96 89 90  Resp:  20 19 16   Temp:  98.2 F (36.8 C) 98.1 F (36.7 C) 98.2 F (36.8 C)  TempSrc:      SpO2: (!) 89% 93% 96% 96%  Weight:      Height:        Intake/Output Summary (Last 24 hours) at 02/23/2023 0746 Last data filed at 02/23/2023 0733 Gross per 24 hour  Intake 240 ml  Output 2050 ml  Net -1810 ml   Filed Weights   02/22/23 1110  Weight: 96.6 kg     Physical Exam:  General: awake, but quickly falls asleep when not directly engaged.  HEENT: atraumatic, clear conjunctiva, anicteric sclera, MMM, hearing grossly normal Respiratory: normal respiratory effort. No wheezing. Rhonchi bases Cardiovascular: quick capillary refill, normal S1/S2, RRR, no JVD, murmurs Nervous: A&O x3. no gross focal neurologic deficits,  normal speech Extremities: moves all equally, 3+ pitting edema to knees Skin: dry, intact, normal temperature, normal color. No rashes, lesions or ulcers on exposed skin Psychiatry: normal mood, congruent affect  Labs   I have personally reviewed the following labs and imaging studies CBC    Component Value Date/Time   WBC 9.2 02/22/2023 1111   RBC 4.27 02/22/2023 1111   HGB 13.0 02/22/2023 1111   HGB 13.9 01/10/2016 1537   HCT 40.3 02/22/2023 1111   HCT 40.8 01/10/2016 1537   PLT 333 02/22/2023 1111   PLT 195 01/10/2016 1537   MCV 94.4 02/22/2023 1111   MCV 90  01/10/2016 1537   MCV 83 10/14/2014 1114   MCH 30.4 02/22/2023 1111   MCHC 32.3 02/22/2023 1111   RDW 17.0 (H) 02/22/2023 1111   RDW 14.4 01/10/2016 1537   RDW 21.1 (H) 10/14/2014 1114   LYMPHSABS 1.3 08/14/2022 0449   LYMPHSABS 0.5 (L) 01/10/2016 1537   LYMPHSABS 1.7 10/14/2014 1114   MONOABS 0.8 08/14/2022 0449   MONOABS 0.4 10/14/2014 1114   EOSABS 0.2 08/14/2022 0449   EOSABS 0.7 10/14/2014 1114   BASOSABS 0.0 08/14/2022 0449   BASOSABS 0.0 10/14/2014 1114      Latest Ref Rng & Units 02/23/2023    5:10 AM 02/22/2023   11:11 AM 08/15/2022    9:36 AM  BMP  Glucose 70 - 99 mg/dL 332  951    BUN 8 - 23 mg/dL 18  20    Creatinine 8.84 - 1.00 mg/dL 1.66  0.63  0.16   Sodium 135 - 145 mmol/L 140  136    Potassium 3.5 - 5.1 mmol/L 2.4  2.5    Chloride 98 - 111 mmol/L 93  94    CO2 22 - 32 mmol/L 36  31    Calcium 8.9 - 10.3 mg/dL 01.0  93.2      CT Angio Chest PE W and/or Wo Contrast  Result Date: 02/22/2023 CLINICAL DATA:  Shortness of breath and chest pain, concern for pulmonary embolism. EXAM: CT ANGIOGRAPHY CHEST WITH CONTRAST TECHNIQUE: Multidetector CT imaging of the chest was performed using the standard protocol during bolus administration of intravenous contrast. Multiplanar CT image reconstructions and MIPs were obtained to evaluate the vascular anatomy. RADIATION DOSE REDUCTION: This exam was performed according to the departmental dose-optimization program which includes automated exposure control, adjustment of the mA and/or kV according to patient size and/or use of iterative reconstruction technique. CONTRAST:  75mL OMNIPAQUE IOHEXOL 350 MG/ML SOLN COMPARISON:  Chest radiograph performed the same day and CT chest dated 08/12/2022. FINDINGS: Cardiovascular: Satisfactory opacification of the pulmonary arteries to the segmental level. No evidence of pulmonary embolism. The right and left pulmonary arteries are mildly enlarged, measuring 2.5 cm each, suggestive of pulmonary  hypertension. Vascular calcifications are seen in the aortic arch. The ascending thoracic aorta is normal in size. Normal heart size. There is trace pericardial fluid. Mediastinum/Nodes: No enlarged mediastinal, hilar, or axillary lymph nodes. Thyroid gland, trachea, and esophagus demonstrate no significant findings. Lungs/Pleura: Bilateral emphysematous changes. There is mild-to-moderate bilateral dependent atelectasis. No pleural effusion or pneumothorax. A 3 mm solid pulmonary nodule in the left upper lobe is not significantly changed since at least 12/31/2019 and is therefore benign. No imaging follow-up is recommended for this finding. Upper Abdomen: A partially imaged left renal cyst measures 3.0 cm in diameter. No imaging follow-up is recommended for this finding. Musculoskeletal: Degenerative changes are seen in the spine. Review of the  MIP images confirms the above findings. IMPRESSION: 1. No evidence of pulmonary embolism. 2. Mild-to-moderate bilateral dependent atelectasis. 3. Mildly enlarged right and left pulmonary arteries, suggestive of pulmonary hypertension. Aortic Atherosclerosis (ICD10-I70.0) and Emphysema (ICD10-J43.9). Electronically Signed   By: Romona Curls M.D.   On: 02/22/2023 14:01   DG Chest 2 View  Result Date: 02/22/2023 CLINICAL DATA:  chest pain EXAM: CHEST - 2 VIEW COMPARISON:  08/12/2022 FINDINGS: The heart size and mediastinal contours are within normal limits. Both lungs are clear. The visualized skeletal structures are unremarkable. IMPRESSION: No active cardiopulmonary disease. Electronically Signed   By: Signa Kell M.D.   On: 02/22/2023 12:23    Disposition Plan & Communication  Patient status: Inpatient  Admitted From: Home Planned disposition location: Home Anticipated discharge date: 8/20 pending clinical improvement  Family Communication: none at bedside    Author: Leeroy Bock, DO [facility] 02/23/2023, 7:46 AM   Available by Epic secure  chat 7AM-7PM. If 7PM-7AM, please contact night-coverage.  [facility] contact information found on [email or web address].

## 2023-02-24 ENCOUNTER — Inpatient Hospital Stay (HOSPITAL_COMMUNITY)
Admit: 2023-02-24 | Discharge: 2023-02-24 | Disposition: A | Discharge status: Home / Self Care | Payer: Medicare PPO | Attending: Internal Medicine | Admitting: Internal Medicine

## 2023-02-24 ENCOUNTER — Other Ambulatory Visit (HOSPITAL_COMMUNITY): Payer: Self-pay

## 2023-02-24 DIAGNOSIS — I5033 Acute on chronic diastolic (congestive) heart failure: Secondary | ICD-10-CM

## 2023-02-24 DIAGNOSIS — I5023 Acute on chronic systolic (congestive) heart failure: Secondary | ICD-10-CM

## 2023-02-24 DIAGNOSIS — I272 Pulmonary hypertension, unspecified: Secondary | ICD-10-CM | POA: Diagnosis not present

## 2023-02-24 DIAGNOSIS — E876 Hypokalemia: Secondary | ICD-10-CM | POA: Diagnosis not present

## 2023-02-24 LAB — BASIC METABOLIC PANEL
Anion gap: 11 (ref 5–15)
BUN: 22 mg/dL (ref 8–23)
CO2: 36 mmol/L — ABNORMAL HIGH (ref 22–32)
Calcium: 10 mg/dL (ref 8.9–10.3)
Chloride: 91 mmol/L — ABNORMAL LOW (ref 98–111)
Creatinine, Ser: 0.99 mg/dL (ref 0.44–1.00)
GFR, Estimated: 58 mL/min — ABNORMAL LOW (ref >60)
Glucose, Bld: 106 mg/dL — ABNORMAL HIGH (ref 70–99)
Potassium: 3 mmol/L — ABNORMAL LOW (ref 3.5–5.1)
Sodium: 138 mmol/L (ref 135–145)

## 2023-02-24 LAB — ECHOCARDIOGRAM COMPLETE
AR max vel: 2.21 cm2
AV Area VTI: 2.03 cm2
AV Area mean vel: 2 cm2
AV Mean grad: 6 mmHg
AV Peak grad: 13.4 mmHg
Ao pk vel: 1.83 m/s
Area-P 1/2: 2.33 cm2
Height: 59 in
MV VTI: 1.95 cm2
S' Lateral: 2.6 cm
Weight: 3233.6 [oz_av]

## 2023-02-24 LAB — GLUCOSE, CAPILLARY
Glucose-Capillary: 117 mg/dL — ABNORMAL HIGH (ref 70–99)
Glucose-Capillary: 150 mg/dL — ABNORMAL HIGH (ref 70–99)
Glucose-Capillary: 134 mg/dL — ABNORMAL HIGH (ref 70–99)
Glucose-Capillary: 141 mg/dL — ABNORMAL HIGH (ref 70–99)

## 2023-02-24 LAB — MAGNESIUM: Magnesium: 2 mg/dL (ref 1.7–2.4)

## 2023-02-24 MED ORDER — POTASSIUM CHLORIDE CRYS ER 20 MEQ PO TBCR
40.0000 meq | EXTENDED_RELEASE_TABLET | Freq: Four times a day (QID) | ORAL | Status: AC
  Administered 2023-02-24 (×3): 40 meq via ORAL
  Filled 2023-02-24 (×3): qty 2

## 2023-02-24 MED ORDER — POTASSIUM CHLORIDE CRYS ER 20 MEQ PO TBCR
40.0000 meq | EXTENDED_RELEASE_TABLET | Freq: Once | ORAL | Status: AC
  Administered 2023-02-24: 40 meq via ORAL
  Filled 2023-02-24: qty 2

## 2023-02-24 NOTE — Progress Notes (Signed)
*  PRELIMINARY RESULTS* Echocardiogram 2D Echocardiogram has been performed.  Ann Silva 02/24/2023, 10:03 AM

## 2023-02-24 NOTE — Progress Notes (Signed)
PROGRESS NOTE  Ann Silva    DOB: 08-24-1945, 77 y.o.  UJW:[id number]    Code Status: Full Code   DOA: 02/22/2023   LOS: 2   Brief hospital course  Ann Silva is a 77 y.o. female with a PMH significant for  chronic HFpEF, PE status post thrombectomy November 2023 on chronic Eliquis, COPD Gold stage II, HTN, GERD, spinal stenosis, morbid obesity, bipolar disorder .  They presented from home to the ED on 02/22/2023 with LEE, SOB, CP x a couple days. Outpatient management with increased lasix was attempted and did not improve symptoms. Woke up with sharp chest pain that prompted her to come to the hospital.   In the ED, it was found that they had Blood pressure borderline low, borderline tachycardia O2 saturation 95% on 2 L.  Significant findings included: CT angiogram negative for PE but pulmonary hypertension and trace bilateral pleural effusion present. Blood work showed K+ 2.5, creatinine 1.2, glucose 219 .  They were initially treated with K+.   Patient was admitted to medicine service for further workup and management of acute on chronic HFpEF exacerbation as outlined in detail below.  02/24/23 -stable, improved  Assessment & Plan  Principal Problem:   CHF (congestive heart failure) (HCC) Active Problems:   Acute hypoxemic respiratory failure (HCC)   COPD (chronic obstructive pulmonary disease) (HCC)   Acute congestive heart failure (HCC)   Hypokalemia   Pulmonary hypertension (HCC)   Acute on chronic HFrEF (heart failure with reduced ejection fraction) (HCC)  Acute on chronic HFpEF exacerbation- BNP 181.3 on admission. >4L UOP yesterday. Down about 6kg. - continue IV diuresis - strict I/O - daily weights - compression stockings - ambulate as tolerated - f/u echo - wean to room air. On 1L [place] and no oxygen requirement at baseline   Severe hypokalemia- improving with supplement but resistant given the diuresis. K+ 2.4>3.4>3.0. Mg++ 2.0 -monitor and replete PRN  Hx  of PE -CTA negative for PE on admission -Continue Eliquis   COPD- no wheezing -Stable, continue ICS, LABA and as needed SABA   Morbid obesity -11 kg gain compared to 6 months ago, secondary to CHF decompensated and fluid overload.  Management as above -Outpatient PCP follow-up   Deconditioning -PT evaluation  Body mass index is 40.82 kg/m.  VTE ppx:  apixaban (ELIQUIS) tablet 5 mg   Diet:     Diet   Diet Heart Room service appropriate? Yes; Fluid consistency: Thin; Fluid restriction: 2000 mL Fluid   Consultants: None   Subjective 02/24/23    Pt reports feeling well today. Denies SOB at rest. No chest pain.    Objective   Vitals:   02/23/23 1154 02/23/23 1626 02/24/23 0028 02/24/23 0450  BP: 113/70 129/62 136/75 103/66  Pulse: 76 78 72 73  Resp: 16 16 18 18   Temp: 98.2 F (36.8 C) 98.1 F (36.7 C) 98.6 F (37 C) 98.1 F (36.7 C)  TempSrc:      SpO2: 94% (!) 89% 93% 91%  Weight:      Height:        Intake/Output Summary (Last 24 hours) at 02/24/2023 0750 Last data filed at 02/24/2023 0528 Gross per 24 hour  Intake 760 ml  Output 1500 ml  Net -740 ml   Filed Weights   02/22/23 1110 02/23/23 0704  Weight: 96.6 kg 91.7 kg     Physical Exam:  General: awake,  alert. NAD HEENT: atraumatic, clear conjunctiva, anicteric sclera, MMM, hearing  grossly normal Respiratory: normal respiratory effort. No wheezing. Rhonchi bases Cardiovascular: quick capillary refill, normal S1/S2, RRR, no JVD, murmurs Nervous: A&O x3. no gross focal neurologic deficits, normal speech Extremities: moves all equally, 2+ pitting edema to knees Skin: dry, intact, normal temperature, normal color. No rashes, lesions or ulcers on exposed skin Psychiatry: normal mood, congruent affect  Labs   I have personally reviewed the following labs and imaging studies CBC    Component Value Date/Time   WBC 9.2 02/22/2023 1111   RBC 4.27 02/22/2023 1111   HGB 13.0 02/22/2023 1111   HGB 13.9  01/10/2016 1537   HCT 40.3 02/22/2023 1111   HCT 40.8 01/10/2016 1537   PLT 333 02/22/2023 1111   PLT 195 01/10/2016 1537   MCV 94.4 02/22/2023 1111   MCV 90 01/10/2016 1537   MCV 83 10/14/2014 1114   MCH 30.4 02/22/2023 1111   MCHC 32.3 02/22/2023 1111   RDW 17.0 (H) 02/22/2023 1111   RDW 14.4 01/10/2016 1537   RDW 21.1 (H) 10/14/2014 1114   LYMPHSABS 1.3 08/14/2022 0449   LYMPHSABS 0.5 (L) 01/10/2016 1537   LYMPHSABS 1.7 10/14/2014 1114   MONOABS 0.8 08/14/2022 0449   MONOABS 0.4 10/14/2014 1114   EOSABS 0.2 08/14/2022 0449   EOSABS 0.7 10/14/2014 1114   BASOSABS 0.0 08/14/2022 0449   BASOSABS 0.0 10/14/2014 1114      Latest Ref Rng & Units 02/24/2023    5:02 AM 02/23/2023   12:58 PM 02/23/2023    5:10 AM  BMP  Glucose 70 - 99 mg/dL 725  366  440   BUN 8 - 23 mg/dL 22  18  18    Creatinine 0.44 - 1.00 mg/dL 3.47  4.25  9.56   Sodium 135 - 145 mmol/L 138  137  140   Potassium 3.5 - 5.1 mmol/L 3.0  3.4  2.4   Chloride 98 - 111 mmol/L 91  91  93   CO2 22 - 32 mmol/L 36  32  36   Calcium 8.9 - 10.3 mg/dL 38.7  56.4  33.2     CT Angio Chest PE W and/or Wo Contrast  Result Date: 02/22/2023 CLINICAL DATA:  Shortness of breath and chest pain, concern for pulmonary embolism. EXAM: CT ANGIOGRAPHY CHEST WITH CONTRAST TECHNIQUE: Multidetector CT imaging of the chest was performed using the standard protocol during bolus administration of intravenous contrast. Multiplanar CT image reconstructions and MIPs were obtained to evaluate the vascular anatomy. RADIATION DOSE REDUCTION: This exam was performed according to the departmental dose-optimization program which includes automated exposure control, adjustment of the mA and/or kV according to patient size and/or use of iterative reconstruction technique. CONTRAST:  75mL OMNIPAQUE IOHEXOL 350 MG/ML SOLN COMPARISON:  Chest radiograph performed the same day and CT chest dated 08/12/2022. FINDINGS: Cardiovascular: Satisfactory opacification of  the pulmonary arteries to the segmental level. No evidence of pulmonary embolism. The right and left pulmonary arteries are mildly enlarged, measuring 2.5 cm each, suggestive of pulmonary hypertension. Vascular calcifications are seen in the aortic arch. The ascending thoracic aorta is normal in size. Normal heart size. There is trace pericardial fluid. Mediastinum/Nodes: No enlarged mediastinal, hilar, or axillary lymph nodes. Thyroid gland, trachea, and esophagus demonstrate no significant findings. Lungs/Pleura: Bilateral emphysematous changes. There is mild-to-moderate bilateral dependent atelectasis. No pleural effusion or pneumothorax. A 3 mm solid pulmonary nodule in the left upper lobe is not significantly changed since at least 12/31/2019 and is therefore benign. No imaging follow-up is recommended  for this finding. Upper Abdomen: A partially imaged left renal cyst measures 3.0 cm in diameter. No imaging follow-up is recommended for this finding. Musculoskeletal: Degenerative changes are seen in the spine. Review of the MIP images confirms the above findings. IMPRESSION: 1. No evidence of pulmonary embolism. 2. Mild-to-moderate bilateral dependent atelectasis. 3. Mildly enlarged right and left pulmonary arteries, suggestive of pulmonary hypertension. Aortic Atherosclerosis (ICD10-I70.0) and Emphysema (ICD10-J43.9). Electronically Signed   By: Romona Curls M.D.   On: 02/22/2023 14:01   DG Chest 2 View  Result Date: 02/22/2023 CLINICAL DATA:  chest pain EXAM: CHEST - 2 VIEW COMPARISON:  08/12/2022 FINDINGS: The heart size and mediastinal contours are within normal limits. Both lungs are clear. The visualized skeletal structures are unremarkable. IMPRESSION: No active cardiopulmonary disease. Electronically Signed   By: Signa Kell M.D.   On: 02/22/2023 12:23    Disposition Plan & Communication  Patient status: Inpatient  Admitted From: Home Planned disposition location: Home Anticipated  discharge date: 8/20 pending clinical improvement  Family Communication: none at bedside    Author: Leeroy Bock, DO [facility] 02/24/2023, 7:50 AM   Available by Epic secure chat 7AM-7PM. If 7PM-7AM, please contact night-coverage.  [facility] contact information found on [email or web address].

## 2023-02-24 NOTE — TOC Benefit Eligibility Note (Signed)
Patient Product/process development scientist completed.    The patient is insured through [employer]. Patient has Medicare and is not eligible for a copay card, but may be able to apply for patient assistance, if available.    Ran test claim for Farxiga 10 mg and the current 30 day co-pay is $136.90.  Ran test claim for Jardiance 10 mg and the current 30 day co-pay is $143.69   This test claim was processed through [employer]- copay amounts may vary at other pharmacies due to [employer], or as the patient moves through the different stages of their insurance plan.     Roland Earl, CPHT Pharmacy Technician III Certified Patient Advocate [facility] Patient Advocate Team Direct Number: [phone]  Fax: [phone]

## 2023-02-24 NOTE — Discharge Instructions (Signed)

## 2023-02-24 NOTE — Progress Notes (Signed)
Occupational Therapy Treatment Patient Details Name: Ann Silva MRN: [id number] DOB: 1946-03-03 Today's Date: 02/24/2023   History of present illness Patient is a 77 year old female with complaints of leg swelling, chest pain, shortness of breath.   OT comments  Pt received semi-reclined in bed. Appearing alert; states she needs to use the toilet; willing to work with OT on toileting, grooming from standing, and functional mobility. T/f with RW CGA; pt on 2L throughout session. See flowsheet below for further details of session. Left seated EOB (in care of PTA) with all needs in reach.  Patient will benefit from continued OT while in acute care. Pt stating her husband saw in her chart that she has new dx of CHF; pt asking questions about what that dx means. OT providing education about functional aspects of managing CHF in everyday life from therapy perspective; encouraged pt to ask MD about more specifics of diagnosis and prognosis.       If plan is discharge home, recommend the following:  A little help with walking and/or transfers;A little help with bathing/dressing/bathroom;Assistance with cooking/housework;Assist for transportation;Help with stairs or ramp for entrance   Equipment Recommendations  None recommended by OT    Recommendations for Other Services      Precautions / Restrictions Precautions Precautions: Fall Precaution Comments: Watch 02 Restrictions Weight Bearing Restrictions: No       Mobility Bed Mobility Overal bed mobility: Needs Assistance Bed Mobility: Supine to Sit     Supine to sit: Contact guard     General bed mobility comments: Increased time. At end of session pt left with care of PTA seated at EOB.    Transfers Overall transfer level: Needs assistance Equipment used: Rolling walker (2 wheels) Transfers: Sit to/from Stand Sit to Stand: Contact guard assist           General transfer comment: Cues for hand placement     Balance  Overall balance assessment: Needs assistance Sitting-balance support: Feet supported, Bilateral upper extremity supported Sitting balance-Leahy Scale: Good     Standing balance support: Reliant on assistive device for balance, Bilateral upper extremity supported Standing balance-Leahy Scale: Fair                             ADL either performed or assessed with clinical judgement   ADL Overall ADL's : Needs assistance/impaired     Grooming: Wash/dry hands;Oral care;Brushing hair;Standing;Contact guard assist Grooming Details (indicate cue type and reason): standing at sink x5 minutes for grooming routine; needed rest break afterwards             Lower Body Dressing: Minimal assistance;Sit to/from stand Lower Body Dressing Details (indicate cue type and reason): With RW in front, OT assisted with threading new underwear; pt able to pull up and then stand at RW with CGA and pull up over hips Toilet Transfer: Contact guard assist;BSC/3in1;Rolling walker (2 wheels) (next to bed)   Toileting- Clothing Manipulation and Hygiene: Minimal assistance;Sit to/from stand Toileting - Clothing Manipulation Details (indicate cue type and reason): OT assisted to doff soiled brief. Pt able to do toilet hygiene for bowel and bladder while standing with CGA/set up     Functional mobility during ADLs: Contact guard assist;Rolling walker (2 wheels) General ADL Comments: Pt on 2L O2 during session. 91-92% at rest. 95% while seated after toileting; dropped to 90% after walking in hallway. Education provided to pt on O2 saturation numbers.  Extremity/Trunk Assessment Upper Extremity Assessment Upper Extremity Assessment: Generalized weakness   Lower Extremity Assessment Lower Extremity Assessment: Generalized weakness        Vision       Perception     Praxis      Cognition Arousal: Alert Behavior During Therapy: WFL for tasks assessed/performed Overall Cognitive Status:  Within Functional Limits for tasks assessed                                 General Comments: more alert today during session.        Exercises      Shoulder Instructions       General Comments On 2L throughout session. Pt walked approx 3 minutes in hallway with RW; chair on standby if needed, but pt did not end up needing it.    Pertinent Vitals/ Pain       Pain Assessment Pain Assessment: 0-10 Pain Score:  (unrated) Pain Location: generalized; mostly in arms Pain Descriptors / Indicators: Sore Pain Intervention(s): Limited activity within patient's tolerance, Monitored during session  Home Living                                          Prior Functioning/Environment              Frequency  Min 1X/week        Progress Toward Goals  OT Goals(current goals can now be found in the care plan section)  Progress towards OT goals: Progressing toward goals  Acute Rehab OT Goals Patient Stated Goal: Go home; get better OT Goal Formulation: With patient/family Time For Goal Achievement: 03/09/23 Potential to Achieve Goals: Good ADL Goals Pt Will Perform Grooming: with modified independence;standing Pt Will Perform Lower Body Dressing: with modified independence;sit to/from stand Pt Will Transfer to Toilet: with modified independence;regular height toilet Pt Will Perform Toileting - Clothing Manipulation and hygiene: with modified independence;sit to/from stand  Plan      Co-evaluation    PT/OT/SLP Co-Evaluation/Treatment: Yes Reason for Co-Treatment: Other (comment) (lines management and pt decreased activity tolerance)   OT goals addressed during session: ADL's and self-care;Proper use of Adaptive equipment and DME      AM-PAC OT "6 Clicks" Daily Activity     Outcome Measure   Help from another person eating meals?: None Help from another person taking care of personal grooming?: A Little Help from another person  toileting, which includes using toliet, bedpan, or urinal?: A Little Help from another person bathing (including washing, rinsing, drying)?: A Little Help from another person to put on and taking off regular upper body clothing?: None Help from another person to put on and taking off regular lower body clothing?: A Little 6 Click Score: 20    End of Session Equipment Utilized During Treatment: Gait belt;Rolling walker (2 wheels);Other (comment) (oxygen at 2L)  OT Visit Diagnosis: Unsteadiness on feet (R26.81);Muscle weakness (generalized) (M62.81)   Activity Tolerance Patient tolerated treatment well   Patient Left Other (comment) (seated EOB in care of PTA)   Nurse Communication Mobility status        Time: 1610-9604 OT Time Calculation (min): 46 min  Charges: OT General Charges $OT Visit: 1 Visit OT Treatments $Self Care/Home Management : 23-37 mins  Linward Foster, MS, OTR/L   Alvester Morin 02/24/2023,  1:15 PM

## 2023-02-24 NOTE — TOC Initial Note (Addendum)
Transition of Care [facility]) - Initial/Assessment Note    Patient Details  Name: Ann Silva MRN: [id number] Date of Birth: 1946/03/02  [facility] CM/SW Contact:    Darolyn Rua, LCSW Phone Number: 02/24/2023, 10:58 AM  Clinical Narrative:                  CSW spoke with patient regarding home health recommendations, she is in agreement with HH PT and OT, no preference of agency. Reports no dme needs. Confirms she continue to see Dr. Hyacinth Meeker as her PCP and she uses [facility] pharmacy on [address]. Reports no dme needs as she has a walker at home.   CSW has made referral to [facility] and [facility]. They were able to accept.    Expected Discharge Plan: Home w Home Health Services Barriers to Discharge: Continued Medical Work up   Patient Goals and CMS Choice Patient states their goals for this hospitalization and ongoing recovery are:: to go home CMS Medicare.gov Compare Post Acute Care list provided to:: Patient Choice offered to / list presented to : Patient      Expected Discharge Plan and Services       Living arrangements for the past 2 months: Single Family Home                                      Prior Living Arrangements/Services Living arrangements for the past 2 months: Single Family Home Lives with:: Spouse                   Activities of Daily Living Home Assistive Devices/Equipment: None ADL Screening (condition at time of admission) Patient's cognitive ability adequate to safely complete daily activities?: Yes Is the patient deaf or have difficulty hearing?: No Does the patient have difficulty seeing, even when wearing glasses/contacts?: No Does the patient have difficulty concentrating, remembering, or making decisions?: No Patient able to express need for assistance with ADLs?: Yes Does the patient have difficulty dressing or bathing?: No Independently performs ADLs?: Yes (appropriate for developmental  age) Does the patient have difficulty walking or climbing stairs?: No Weakness of Legs: Both Weakness of Arms/Hands: None  Permission Sought/Granted                  Emotional Assessment              Admission diagnosis:  Hypokalemia [E87.6] CHF (congestive heart failure) (HCC) [I50.9] Pulmonary hypertension (HCC) [I27.20] Patient Active Problem List   Diagnosis Date Noted   Hypokalemia 02/23/2023   Pulmonary hypertension (HCC) 02/23/2023   Acute on chronic HFrEF (heart failure with reduced ejection fraction) (HCC) 02/23/2023   CHF (congestive heart failure) (HCC) 02/22/2023   Sepsis secondary to UTI (HCC) 08/13/2022   Acute hypoxemic respiratory failure (HCC) 08/12/2022   Obesity, Class III, BMI 40-49.9 (morbid obesity) (HCC) 08/12/2022   Leukocytosis 08/12/2022   SIRS due to infectious process with organ dysfunction (HCC) 08/12/2022   Acute pulmonary embolism with acute cor pulmonale (HCC) 06/06/2022   Obesity (BMI 30-39.9) 05/20/2022   Hyperparathyroidism (HCC) 05/20/2022   Chronic pain 05/20/2022   Essential hypertension 05/20/2022   Tobacco abuse 05/20/2022   Acute pulmonary embolism (HCC) 05/19/2022   Influenza due to identified novel influenza A virus with other respiratory manifestations 05/19/2022   COPD (chronic obstructive pulmonary disease) (HCC) 05/19/2022   AKI (acute  kidney injury) (HCC) 05/19/2022   Acute respiratory failure with hypoxia (HCC) 05/19/2022   Hypercalcemia 05/19/2022   Dislocated hip (HCC) 12/11/2020   S/P closed reduction of dislocated total hip prosthesis 12/11/2020   Chronic bilateral low back pain with left-sided sciatica (Primary Area of Pain) 03/12/2018   Chronic pain of left lower extremity (Secondary Area of Pain) 03/12/2018   Chronic pain syndrome 03/12/2018   Long term current use of opiate analgesic 03/12/2018   Pharmacologic therapy 03/12/2018   Disorder of skeletal system 03/12/2018   Problems influencing health status  03/12/2018   Hyperparathyroidism, primary (HCC) 01/20/2018   Medicare annual wellness visit, initial 09/02/2017   Acute saddle pulmonary embolism with acute cor pulmonale (HCC) 02/20/2017   Dyspnea 12/29/2016   Acute congestive heart failure (HCC)    HCAP (healthcare-associated pneumonia)    Pulmonary embolus (HCC)    Lumbar adjacent segment disease with spondylolisthesis 12/25/2016   Aortic atherosclerosis (HCC) 08/14/2016   Lymphedema of left leg 08/05/2016   B12 deficiency 05/15/2016   Chronic contact dermatitis 05/01/2016   History of colon cancer 05/01/2016   Mild chronic obstructive pulmonary disease (HCC) 05/01/2016   COPD with acute exacerbation (HCC) 11/27/2015   COPD exacerbation (HCC) 08/21/2015   Spinal stenosis of lumbar region 04/02/2015   Restless legs 02/27/2015   Hypertension    Tobacco use disorder 09/12/2014   Anemia 06/17/2014   Sedated due to medication 06/17/2014   Obstructive sleep apnea 06/16/2014   Benign essential hypertension 06/16/2014   Restless legs syndrome 06/16/2014   Colon cancer (HCC)    Depression    History of total hip replacement, left 10/25/2013   S/P total hip arthroplasty 10/25/2013   PCP:  Danella Penton, MD Pharmacy:   [id number] [place], [place] - [address] AT [facility] [address] [place] [address] Phone: [phone] Fax: [phone]     Social Determinants of Health (SDOH) Social History: SDOH Screenings   Food Insecurity: No Food Insecurity (02/22/2023)  Housing: Patient Declined (02/22/2023)  Transportation Needs: No Transportation Needs (02/22/2023)  Utilities: Patient Declined (02/22/2023)  Financial Resource Strain: Low Risk  (01/22/2023)   Received from [facility]  Tobacco Use: Medium Risk (02/22/2023)   SDOH Interventions:     Readmission Risk Interventions     No data to display

## 2023-02-24 NOTE — Progress Notes (Incomplete)
[facility] HF Stewardship  PCP: Danella Penton, MD  PCP-Cardiologist: None  HPI: Ann Silva is a 77 y.o. female with chronic HFpEF, PE status post thrombectomy November 2023 on chronic Eliquis, COPD Gold stage II, HTN, GERD, spinal stenosis, morbid obesity, bipolar disorder  who presented with bilateral leg swelling, shortness of breath, and chest pain. LVEF in 05/2022 was 50-55% with grade I diastolic dysfunction. CTA 8/17 negative for PE. BNP and troponin mildly elevated on admission at 181 and 43 respectively. Echocardiogram performed 02/24/23.  Pertinent Lab Values: Creatinine  Date Value Ref Range Status  10/14/2014 0.52 mg/dL Final    Comment:    8.11-9.14 NOTE: New Reference Range  09/13/14    Creatinine, Ser  Date Value Ref Range Status  02/24/2023 0.99 0.44 - 1.00 mg/dL Final   BUN  Date Value Ref Range Status  02/24/2023 22 8 - 23 mg/dL Final  78/29/5621 16 8 - 27 mg/dL Final  30/86/5784 23 (H) 7 - 18 mg/dL Final   Potassium  Date Value Ref Range Status  02/24/2023 3.0 (L) 3.5 - 5.1 mmol/L Final  07/09/2014 3.4 (L) 3.5 - 5.1 mmol/L Final   Sodium  Date Value Ref Range Status  02/24/2023 138 135 - 145 mmol/L Final  01/10/2016 138 134 - 144 mmol/L Final  07/09/2014 139 136 - 145 mmol/L Final   B Natriuretic Peptide  Date Value Ref Range Status  02/22/2023 181.3 (H) 0.0 - 100.0 pg/mL Final    Comment:    Performed at [facility], [address] [place], [place] [address]   Magnesium  Date Value Ref Range Status  02/22/2023 2.0 1.7 - 2.4 mg/dL Final    Comment:    Performed at [facility], [address] [place], [place] [address]   Hgb A1c MFr Bld  Date Value Ref Range Status  02/22/2023 6.3 (H) 4.8 - 5.6 % Final    Comment:    (NOTE) Pre diabetes:          5.7%-6.4%  Diabetes:              >6.4%  Glycemic control for   <7.0% adults with diabetes    TSH  Date Value Ref Range Status  05/22/2022 0.354 0.350 - 4.500 uIU/mL  Final    Comment:    Performed by a 3rd Generation assay with a functional sensitivity of <=0.01 uIU/mL. Performed at [facility], [address] [place], [place] [address]   01/02/2010 2.011 ***Test methodology is 3rd generation TSH*** 0.350 - 4.500 uIU/mL Final    Vital Signs: Temp:  [98.1 F (36.7 C)-98.6 F (37 C)] 98.3 F (36.8 C) (08/19 0751) Pulse Rate:  [72-78] 76 (08/19 0752) Cardiac Rhythm: Normal sinus rhythm (08/19 0752) Resp:  [16-20] 20 (08/19 0752) BP: (103-136)/(59-75) 129/59 (08/19 0751) SpO2:  [89 %-94 %] 94 % (08/19 0752)   Intake/Output Summary (Last 24 hours) at 02/24/2023 0842 Last data filed at 02/24/2023 0528 Gross per 24 hour  Intake 520 ml  Output 1500 ml  Net -980 ml    Current Inpatient Medications:                 Prior to admission HF Medications:                 Assessment: 1. {CHL AMB Acute or Chronic:210917265}  -  Plan: 1) Medication changes recommended at this time:  2) Patient assistance:                                                                                        3) Education: -To be completed prior to discharge.  ***  Medication Assistance / Insurance Benefits Check:  Does the patient have prescription insurance?    Type of insurance plan:   Does the patient qualify for medication assistance through manufacturers or grants? {CHL AMB Yes/No/Pending:210917269}   Eligible grants and/or patient assistance programs: ***   Medication assistance applications in progress: ***   Medication assistance applications approved: ***  Approved medication assistance renewals will be completed by: ***  Outpatient Pharmacy:  Prior to admission outpatient pharmacy: ***        ***

## 2023-02-25 DIAGNOSIS — E876 Hypokalemia: Secondary | ICD-10-CM | POA: Diagnosis not present

## 2023-02-25 DIAGNOSIS — I272 Pulmonary hypertension, unspecified: Secondary | ICD-10-CM | POA: Diagnosis not present

## 2023-02-25 DIAGNOSIS — I5033 Acute on chronic diastolic (congestive) heart failure: Secondary | ICD-10-CM | POA: Diagnosis not present

## 2023-02-25 LAB — BASIC METABOLIC PANEL
Anion gap: 10 (ref 5–15)
Anion gap: 14 (ref 5–15)
BUN: 23 mg/dL (ref 8–23)
BUN: 29 mg/dL — ABNORMAL HIGH (ref 8–23)
CO2: 34 mmol/L — ABNORMAL HIGH (ref 22–32)
CO2: 33 mmol/L — ABNORMAL HIGH (ref 22–32)
Calcium: 10.2 mg/dL (ref 8.9–10.3)
Calcium: 10.8 mg/dL — ABNORMAL HIGH (ref 8.9–10.3)
Chloride: 93 mmol/L — ABNORMAL LOW (ref 98–111)
Chloride: 90 mmol/L — ABNORMAL LOW (ref 98–111)
Creatinine, Ser: 1 mg/dL (ref 0.44–1.00)
Creatinine, Ser: 1.15 mg/dL — ABNORMAL HIGH (ref 0.44–1.00)
GFR, Estimated: 58 mL/min — ABNORMAL LOW (ref >60)
GFR, Estimated: 49 mL/min — ABNORMAL LOW (ref >60)
Glucose, Bld: 119 mg/dL — ABNORMAL HIGH (ref 70–99)
Glucose, Bld: 151 mg/dL — ABNORMAL HIGH (ref 70–99)
Potassium: 3 mmol/L — ABNORMAL LOW (ref 3.5–5.1)
Potassium: 3.9 mmol/L (ref 3.5–5.1)
Sodium: 137 mmol/L (ref 135–145)
Sodium: 137 mmol/L (ref 135–145)

## 2023-02-25 LAB — GLUCOSE, CAPILLARY
Glucose-Capillary: 147 mg/dL — ABNORMAL HIGH (ref 70–99)
Glucose-Capillary: 174 mg/dL — ABNORMAL HIGH (ref 70–99)
Glucose-Capillary: 132 mg/dL — ABNORMAL HIGH (ref 70–99)
Glucose-Capillary: 185 mg/dL — ABNORMAL HIGH (ref 70–99)

## 2023-02-25 MED ORDER — ORAL CARE MOUTH RINSE: 15.0000 mL | OROMUCOSAL | Status: DC | PRN

## 2023-02-25 MED ORDER — FUROSEMIDE 40 MG PO TABS
40.0000 mg | ORAL_TABLET | Freq: Every day | ORAL | Status: DC
  Administered 2023-02-25 – 2023-02-26 (×2): 40 mg via ORAL
  Filled 2023-02-25 (×2): qty 1

## 2023-02-25 MED ORDER — POTASSIUM CHLORIDE 20 MEQ PO PACK
40.0000 meq | PACK | Freq: Two times a day (BID) | ORAL | Status: DC
  Administered 2023-02-25 – 2023-02-26 (×3): 40 meq via ORAL
  Filled 2023-02-25 (×3): qty 2

## 2023-02-25 NOTE — Progress Notes (Signed)
Occupational Therapy Treatment Patient Details Name: Ann Silva MRN: [id number] DOB: 1945-09-21 Today's Date: 02/25/2023   History of present illness Patient is a 77 year old female with complaints of leg swelling, chest pain, shortness of breath.   OT comments  Pt received semi-reclined in bed. Appearing alert; has just finished breakfast; willing to work with OT on grooming and toileting. T/f SBA with cues; pt c/o pain in L foot from wrapping; OT notified RN and MD. Pt did mobilize with RW to the sink. On 2L throughout session; 95% sat. See flowsheet below for further details of session. Left semi-reclined in bed with all needs in reach.  Patient will benefit from continued OT while in acute care.       If plan is discharge home, recommend the following:  A little help with walking and/or transfers;A little help with bathing/dressing/bathroom;Assistance with cooking/housework;Assist for transportation;Help with stairs or ramp for entrance   Equipment Recommendations  None recommended by OT    Recommendations for Other Services      Precautions / Restrictions Precautions Precautions: Fall Precaution Comments: Watch 02 Restrictions Weight Bearing Restrictions: No       Mobility Bed Mobility Overal bed mobility: Modified Independent Bed Mobility: Supine to Sit, Sit to Supine     Supine to sit: HOB elevated, Used rails, Modified independent (Device/Increase time) Sit to supine: Modified independent (Device/Increase time), Used rails, HOB elevated   General bed mobility comments: Increased time; no OT assist    Transfers Overall transfer level: Needs assistance Equipment used: Rolling walker (2 wheels) Transfers: Sit to/from Stand Sit to Stand: Supervision           General transfer comment: Cues for hand placement     Balance Overall balance assessment: Mild deficits observed, not formally tested                                          ADL either performed or assessed with clinical judgement   ADL Overall ADL's : Needs assistance/impaired     Grooming: Supervision/safety;Oral care;Wash/dry hands;Standing Grooming Details (indicate cue type and reason): standing at sink x4 minutes; using RW safely                 Toilet Transfer: Supervision/safety;Rolling walker (2 wheels);BSC/3in1 Toilet Transfer Details (indicate cue type and reason): Pt was going to mobilize to the bathroom, but noticed her L foot was hurting (new wrapping; RN notified) and requested to just t/f to [facility] since this was her first time up. No OT assistance needed besides supervision and cues for RW management. Toileting- Clothing Manipulation and Hygiene: Supervision/safety;Sit to/from stand Toileting - Clothing Manipulation Details (indicate cue type and reason): Pt able to manage pulling underwear down and back up (down to thighs durin toileting)     Functional mobility during ADLs: Contact guard assist;Supervision/safety;Rolling walker (2 wheels) General ADL Comments: Pt on 2L O2. 95% while standing.    Extremity/Trunk Assessment Upper Extremity Assessment Upper Extremity Assessment: Generalized weakness   Lower Extremity Assessment Lower Extremity Assessment: Defer to PT evaluation        Vision       Perception     Praxis      Cognition Arousal: Alert Behavior During Therapy: [facility] for tasks assessed/performed Overall Cognitive Status: Within Functional Limits for tasks assessed  General Comments: alert, pleasant, participatory        Exercises      Shoulder Instructions       General Comments On 2L, 95%. Pt educated that she does need O2 when up and moving.    Pertinent Vitals/ Pain       Pain Assessment Pain Assessment: 0-10 Pain Score: 6  Pain Location: L foot- feels like wrapping is too tight; RN notified Pain Descriptors / Indicators: Aching Pain  Intervention(s): Limited activity within patient's tolerance, Monitored during session  Home Living                                          Prior Functioning/Environment              Frequency  Min 1X/week        Progress Toward Goals  OT Goals(current goals can now be found in the care plan section)  Progress towards OT goals: Progressing toward goals  Acute Rehab OT Goals Patient Stated Goal: Go home OT Goal Formulation: With patient/family Time For Goal Achievement: 03/09/23 Potential to Achieve Goals: Good ADL Goals Pt Will Perform Grooming: with modified independence;standing Pt Will Perform Lower Body Dressing: with modified independence;sit to/from stand Pt Will Transfer to Toilet: with modified independence;regular height toilet Pt Will Perform Toileting - Clothing Manipulation and hygiene: with modified independence;sit to/from stand  Plan      Co-evaluation                 AM-PAC OT "6 Clicks" Daily Activity     Outcome Measure   Help from another person eating meals?: None Help from another person taking care of personal grooming?: A Little Help from another person toileting, which includes using toliet, bedpan, or urinal?: A Little Help from another person bathing (including washing, rinsing, drying)?: A Little Help from another person to put on and taking off regular upper body clothing?: None Help from another person to put on and taking off regular lower body clothing?: A Little 6 Click Score: 20    End of Session Equipment Utilized During Treatment: Rolling walker (2 wheels);Oxygen  OT Visit Diagnosis: Unsteadiness on feet (R26.81);Muscle weakness (generalized) (M62.81)   Activity Tolerance Patient tolerated treatment well;Patient limited by pain (RN notified)   Patient Left in bed;with call bell/phone within reach;with bed alarm set   Nurse Communication Mobility status;Other (comment) (pain in L foot from  wrapping)        Time: 0347-4259 OT Time Calculation (min): 21 min  Charges: OT General Charges $OT Visit: 1 Visit OT Treatments $Self Care/Home Management : 8-22 mins  Linward Foster, MS, OTR/L  Alvester Morin 02/25/2023, 9:50 AM

## 2023-02-25 NOTE — Care Management Important Message (Signed)
Important Message  Patient Details  Name: Ann Silva MRN: [id number] Date of Birth: Sep 24, 1945   Medicare Important Message Given:  N/A - LOS <3 / Initial given by admissions     Johnell Comings 02/25/2023, 10:57 AM

## 2023-02-25 NOTE — Progress Notes (Signed)
[facility] HF Stewardship  PCP: Danella Penton, MD  PCP-Cardiologist: None  HPI: Ann Silva is a 77 y.o. female with chronic HFpEF, PE status post thrombectomy November 2023 on chronic Eliquis, COPD Gold stage II, HTN, GERD, spinal stenosis, morbid obesity, bipolar disorder  who presented with bilateral leg swelling, shortness of breath, and chest pain. LVEF in 05/2022 was 50-55% with grade I diastolic dysfunction. CTA 8/17 negative for PE. BNP and troponin mildly elevated on admission at 181 and 43 respectively. Echocardiogram performed 02/24/23 showing LVEF 55-60% with grade I diastolic dysfunction.  Pertinent Lab Values: Creatinine  Date Value Ref Range Status  10/14/2014 0.52 mg/dL Final    Comment:    0.27-2.53 NOTE: New Reference Range  09/13/14    Creatinine, Ser  Date Value Ref Range Status  02/25/2023 1.00 0.44 - 1.00 mg/dL Final   BUN  Date Value Ref Range Status  02/25/2023 23 8 - 23 mg/dL Final  66/44/0347 16 8 - 27 mg/dL Final  42/59/5638 23 (H) 7 - 18 mg/dL Final   Potassium  Date Value Ref Range Status  02/25/2023 3.0 (L) 3.5 - 5.1 mmol/L Final  07/09/2014 3.4 (L) 3.5 - 5.1 mmol/L Final   Sodium  Date Value Ref Range Status  02/25/2023 137 135 - 145 mmol/L Final  01/10/2016 138 134 - 144 mmol/L Final  07/09/2014 139 136 - 145 mmol/L Final   B Natriuretic Peptide  Date Value Ref Range Status  02/22/2023 181.3 (H) 0.0 - 100.0 pg/mL Final    Comment:    Performed at [facility], [address] [place], [place] [address]   Magnesium  Date Value Ref Range Status  02/24/2023 2.0 1.7 - 2.4 mg/dL Final    Comment:    Performed at [facility], [address] [place], [place] [address]   Hgb A1c MFr Bld  Date Value Ref Range Status  02/22/2023 6.3 (H) 4.8 - 5.6 % Final    Comment:    (NOTE) Pre diabetes:          5.7%-6.4%  Diabetes:              >6.4%  Glycemic control for   <7.0% adults with diabetes    TSH  Date Value Ref  Range Status  05/22/2022 0.354 0.350 - 4.500 uIU/mL Final    Comment:    Performed by a 3rd Generation assay with a functional sensitivity of <=0.01 uIU/mL. Performed at [facility], [address] [place], [place] [address]   01/02/2010 2.011 Test methodology is 3rd generation TSH 0.350 - 4.500 uIU/mL Final    Vital Signs: Temp:  [97.6 F (36.4 C)-98.3 F (36.8 C)] 97.6 F (36.4 C) (08/20 0746) Pulse Rate:  [75-83] 83 (08/20 0746) Cardiac Rhythm: Normal sinus rhythm (08/20 0848) Resp:  [16-20] 16 (08/20 0746) BP: (108-117)/(55-59) 110/56 (08/20 0746) SpO2:  [88 %-97 %] 91 % (08/20 0746) Weight:  [91.2 kg (201 lb 1 oz)] 91.2 kg (201 lb 1 oz) (08/20 0500)   Intake/Output Summary (Last 24 hours) at 02/25/2023 1124 Last data filed at 02/25/2023 1056 Gross per 24 hour  Intake 600 ml  Output --  Net 600 ml    Current Inpatient Medications:  Spironolactone 25 mg daily Furosemide 40 mg PO daily Other: metoprolol succinate 50 mg daily  Prior to admission HF Medications:  Spironolactone 25 mg daily Furosemide 20 mg PO daily Other: metoprolol succinate 50 mg daily  Assessment: 1. Diastolic heart failure (LVEF  50-55%), due to NICM. NYHA class III symptoms.  -Patient ambulating the hall this AM on 2L O2. Reports feeling much improved. Creatinine and BP stable. Agree with transition from IV ot PO diuretics. -Given HFpEF, first line treatment is an SGLT2i, however this is not an ideal option considering admission this year for pyelonephritis.   Plan: 1) Medication changes recommended at this time: -Consider supplementing potassium with 40 meq KCl x 1 in addition to her scheduled BID dosing.  2) Patient assistance: -None  3) Education: -- Patient has been educated on current HF medications and potential additions to HF medication regimen - Patient verbalizes understanding that over the next few months, these medication doses may change and more medications may be  added to optimize HF regimen - Patient has been educated on basic disease state pathophysiology and goals of therapy  Medication Assistance / Insurance Benefits Check:  Does the patient have prescription insurance? Prescription Insurance: Medicare  Type of insurance plan:   Does the patient qualify for medication assistance through manufacturers or grants? No  Outpatient Pharmacy:  Prior to admission outpatient pharmacy: [facility]     Thank you for involving pharmacy in this patient's care.  Enos Fling, PharmD, BCPS Phone - [phone] Clinical Pharmacist 02/25/2023 11:24 AM

## 2023-02-25 NOTE — Progress Notes (Signed)
Physical Therapy Treatment Patient Details Name: Ann Silva MRN: [id number] DOB: 1945-09-04 Today's Date: 02/25/2023   History of Present Illness Patient is a 77 year old female with complaints of leg swelling, chest pain, shortness of breath.    PT Comments  Pt was pleasant and motivated to participate during the session and put forth good effort throughout. Pt found seated in recliner upon entry. Pt currently reporting pain at 6/10 NPS at L ankle due to LLE wrapping being too tight, nursing notified. Pt required Min A for initial boost to perform STS with RW with cues for hand placement. SpO2 monitored throughout, attempted trial of RA at rest and pt unable to maintain SpO2 above 90%, placed back on 2L for safety, MD and nursing notified. Pt able to amb with RW at [facility] for 60 feet. Pt finished session back in recliner, noting no change in L ankle pain. Pt will benefit from continued PT services upon discharge to safely address deficits listed in patient problem list for decreased caregiver assistance and eventual return to PLOF.    If plan is discharge home, recommend the following: A little help with walking and/or transfers;A little help with bathing/dressing/bathroom;Help with stairs or ramp for entrance;Assist for transportation   Can travel by private vehicle        Equipment Recommendations  None recommended by PT    Recommendations for Other Services       Precautions / Restrictions Precautions Precautions: Fall Precaution Comments: Watch 02 Restrictions Weight Bearing Restrictions: No     Mobility  Bed Mobility               General bed mobility comments: Pt found in recliner    Transfers Overall transfer level: Needs assistance Equipment used: Rolling walker (2 wheels) Transfers: Sit to/from Stand Sit to Stand: Supervision           General transfer comment: Cues for hand placement    Ambulation/Gait Ambulation/Gait assistance: Contact guard  assist Gait Distance (Feet): 55 Feet Assistive device: Rolling walker (2 wheels) Gait Pattern/deviations: Step-through pattern, Decreased step length - right, Decreased step length - left, Decreased stride length Gait velocity: decreased     General Gait Details: Slowed speed overall, pt reports she would be able to walk more if L ankle was not in pain d/t wrapping. Cues for safe DME use, upright posture, and proper sequencing.   Stairs             Wheelchair Mobility     Tilt Bed    Modified Rankin (Stroke Patients Only)       Balance Overall balance assessment: Needs assistance Sitting-balance support: Feet supported, Bilateral upper extremity supported Sitting balance-Leahy Scale: Good     Standing balance support: Reliant on assistive device for balance, Bilateral upper extremity supported, During functional activity Standing balance-Leahy Scale: Fair Standing balance comment: static standing                            Cognition Arousal: Alert Behavior During Therapy: WFL for tasks assessed/performed Overall Cognitive Status: Within Functional Limits for tasks assessed                                          Exercises Total Joint Exercises Ankle Circles/Pumps: AROM, Both, 10 reps, Strengthening Gluteal Sets: 5 reps, Both, Strengthening, AROM  General Comments General comments (skin integrity, edema, etc.): On 2L, 95%. Pt educated that she does need O2 when up and moving.      Pertinent Vitals/Pain Pain Assessment Pain Assessment: 0-10 Pain Score: 6  Pain Location: L foot- feels like wrapping is too tight; RN notified Pain Descriptors / Indicators: Aching Pain Intervention(s): Monitored during session, Limited activity within patient's tolerance    Home Living                          Prior Function            PT Goals (current goals can now be found in the care plan section) Progress towards PT goals:  Progressing toward goals    Frequency    Min 1X/week      PT Plan      Co-evaluation              AM-PAC PT "6 Clicks" Mobility   Outcome Measure  Help needed turning from your back to your side while in a flat bed without using bedrails?: A Little Help needed moving from lying on your back to sitting on the side of a flat bed without using bedrails?: A Little Help needed moving to and from a bed to a chair (including a wheelchair)?: A Little Help needed standing up from a chair using your arms (e.g., wheelchair or bedside chair)?: A Little Help needed to walk in hospital room?: A Little Help needed climbing 3-5 steps with a railing? : A Lot 6 Click Score: 17    End of Session Equipment Utilized During Treatment: Gait belt Activity Tolerance: Patient tolerated treatment well;Patient limited by pain Patient left: with call bell/phone within reach;in chair;with chair alarm set;with family/visitor present Nurse Communication: Mobility status;Other (comment) (pt's c/c of LLE wrapping being too tight) PT Visit Diagnosis: Unsteadiness on feet (R26.81);Muscle weakness (generalized) (M62.81)     Time: 4782-9562 PT Time Calculation (min) (ACUTE ONLY): 25 min  Charges:                            Cecile Sheerer, SPT 02/25/23, 1:21 PM

## 2023-02-25 NOTE — Progress Notes (Signed)
PROGRESS NOTE  Ann Silva    DOB: Jun 07, 1946, 77 y.o.  WNU:[id number]    Code Status: Full Code   DOA: 02/22/2023   LOS: 3   Brief hospital course  Ann Silva is a 77 y.o. female with a PMH significant for  chronic HFpEF, PE status post thrombectomy November 2023 on chronic Eliquis, COPD Gold stage II, HTN, GERD, spinal stenosis, morbid obesity, bipolar disorder.  They presented from home to the ED on 02/22/2023 with LEE, SOB, CP x a couple days. Outpatient management with increased lasix was attempted and did not improve symptoms. Woke up with sharp chest pain that prompted her to come to the hospital.   In the ED, it was found that they had Blood pressure borderline low, borderline tachycardia O2 saturation 95% on 2 L.  Significant findings included: CT angiogram negative for PE but pulmonary hypertension and trace bilateral pleural effusion present. Blood work showed K+ 2.5, creatinine 1.2, glucose 219 .  They were initially treated with K+.   Patient was admitted to medicine service for further workup and management of acute on chronic HFpEF exacerbation as outlined in detail below.  02/25/23 -stable, improved. Trial of ambulation on pulse ox and still requiring oxygen today.   Assessment & Plan  Principal Problem:   CHF (congestive heart failure) (HCC) Active Problems:   Acute hypoxemic respiratory failure (HCC)   COPD (chronic obstructive pulmonary disease) (HCC)   Acute congestive heart failure (HCC)   Hypokalemia   Pulmonary hypertension (HCC)   Acute on chronic HFrEF (heart failure with reduced ejection fraction) (HCC)  Acute on chronic HFpEF exacerbation- BNP 181.3 on admission. >4L UOP yesterday. Down about 6kg. Echo showing mild DD - continue IV diuresis--> changed to PO diuresis today given improvement for dispo planning and to reduce the effect on her K+ - strict I/O - daily weights - compression stockings - ambulate as tolerated - wean to room air. On 1L  [place] and no oxygen requirement at baseline. Can likely dc 8/21   Severe hypokalemia- improving with supplement but resistant given the diuresis. K+ 2.4>3.4>3.0>3.9. Mg++ 2.0 resolved -monitor and replete PRN  Hx of PE -CTA negative for PE on admission -Continue Eliquis   COPD- no wheezing -Stable, continue ICS, LABA and as needed SABA   Morbid obesity -11 kg gain compared to 6 months ago, secondary to CHF decompensated and fluid overload.  Management as above -Outpatient PCP follow-up   Deconditioning -PT evaluation  Body mass index is 40.61 kg/m.  VTE ppx:  apixaban (ELIQUIS) tablet 5 mg   Diet:     Diet   Diet 2 gram sodium Fluid consistency: Thin; Fluid restriction: 2000 mL Fluid   Consultants: None   Subjective 02/25/23    Pt reports feeling well today. Denies SOB at rest. No chest pain. Excited about going home. We discussed if her potassium improved and she could ambulate without oxygen use and no desats that she could go home today but is still requiring oxygen so will continue with diuresis and try again tomorrow.   Objective   Vitals:   02/24/23 1631 02/24/23 1631 02/25/23 0500 02/25/23 0746  BP: (!) 117/59   (!) 110/56  Pulse: 76 75  83  Resp: 20   16  Temp: 98.3 F (36.8 C)   97.6 F (36.4 C)  TempSrc:      SpO2: (!) 88% 97%  91%  Weight:   91.2 kg   Height:  Intake/Output Summary (Last 24 hours) at 02/25/2023 0816 Last data filed at 02/25/2023 0600 Gross per 24 hour  Intake 600 ml  Output 200 ml  Net 400 ml   Filed Weights   02/23/23 0704 02/24/23 0921 02/25/23 0500  Weight: 91.7 kg 90.4 kg 91.2 kg     Physical Exam:  General: awake,  alert. NAD HEENT: atraumatic, clear conjunctiva, anicteric sclera, MMM, hearing grossly normal Respiratory: normal respiratory effort. No wheezing. Rhonchi bases Cardiovascular: quick capillary refill, normal S1/S2, RRR, no JVD, murmurs Nervous: A&O x3. no gross focal neurologic deficits, normal  speech Extremities: moves all equally, non pitting edema to knees Skin: dry, intact, normal temperature, normal color. No rashes, lesions or ulcers on exposed skin Psychiatry: normal mood, congruent affect  Labs   I have personally reviewed the following labs and imaging studies CBC    Component Value Date/Time   WBC 9.2 02/22/2023 1111   RBC 4.27 02/22/2023 1111   HGB 13.0 02/22/2023 1111   HGB 13.9 01/10/2016 1537   HCT 40.3 02/22/2023 1111   HCT 40.8 01/10/2016 1537   PLT 333 02/22/2023 1111   PLT 195 01/10/2016 1537   MCV 94.4 02/22/2023 1111   MCV 90 01/10/2016 1537   MCV 83 10/14/2014 1114   MCH 30.4 02/22/2023 1111   MCHC 32.3 02/22/2023 1111   RDW 17.0 (H) 02/22/2023 1111   RDW 14.4 01/10/2016 1537   RDW 21.1 (H) 10/14/2014 1114   LYMPHSABS 1.3 08/14/2022 0449   LYMPHSABS 0.5 (L) 01/10/2016 1537   LYMPHSABS 1.7 10/14/2014 1114   MONOABS 0.8 08/14/2022 0449   MONOABS 0.4 10/14/2014 1114   EOSABS 0.2 08/14/2022 0449   EOSABS 0.7 10/14/2014 1114   BASOSABS 0.0 08/14/2022 0449   BASOSABS 0.0 10/14/2014 1114      Latest Ref Rng & Units 02/25/2023    4:43 AM 02/24/2023    5:02 AM 02/23/2023   12:58 PM  BMP  Glucose 70 - 99 mg/dL 981  191  478   BUN 8 - 23 mg/dL 23  22  18    Creatinine 0.44 - 1.00 mg/dL 2.95  6.21  3.08   Sodium 135 - 145 mmol/L 137  138  137   Potassium 3.5 - 5.1 mmol/L 3.0  3.0  3.4   Chloride 98 - 111 mmol/L 93  91  91   CO2 22 - 32 mmol/L 34  36  32   Calcium 8.9 - 10.3 mg/dL 65.7  84.6  96.2     ECHOCARDIOGRAM COMPLETE  Result Date: 02/24/2023    ECHOCARDIOGRAM REPORT   Patient Name:   Ann Silva Date of Exam: 02/24/2023 Medical Rec #:  [id number]       Height:       59.0 in Accession #:    [id number]      Weight:       202.1 lb Date of Birth:  25-Sep-1945       BSA:          1.853 m Patient Age:    77 years        BP:           129/59 mmHg Patient Gender: F               HR:           69 bpm. Exam Location:  [facility] Procedure: 2D Echo, Cardiac  Doppler and Color Doppler Indications:     CHF  History:  Patient has prior history of Echocardiogram examinations, most                  recent 05/20/2022. CHF, Pulmonary HTN and COPD,                  Signs/Symptoms:Dyspnea; Risk Factors:Hypertension, Sleep Apnea                  and Former Smoker.  Sonographer:     Mikki Harbor Referring Phys:  [id number] Emeline General Diagnosing Phys: Lorine Bears MD  Sonographer Comments: Technically difficult study due to poor echo windows. Image acquisition challenging due to COPD. IMPRESSIONS  1. Left ventricular ejection fraction, by estimation, is 55 to 60%. The left ventricle has normal function. The left ventricle has no regional wall motion abnormalities. Left ventricular diastolic parameters are consistent with Grade I diastolic dysfunction (impaired relaxation).  2. Right ventricular systolic function is normal. The right ventricular size is normal. There is normal pulmonary artery systolic pressure.  3. The mitral valve is normal in structure. No evidence of mitral valve regurgitation. No evidence of mitral stenosis.  4. The aortic valve is normal in structure. Aortic valve regurgitation is not visualized. No aortic stenosis is present.  5. The inferior vena cava is normal in size with greater than 50% respiratory variability, suggesting right atrial pressure of 3 mmHg. FINDINGS  Left Ventricle: Left ventricular ejection fraction, by estimation, is 55 to 60%. The left ventricle has normal function. The left ventricle has no regional wall motion abnormalities. The left ventricular internal cavity size was normal in size. There is  no left ventricular hypertrophy. Left ventricular diastolic parameters are consistent with Grade I diastolic dysfunction (impaired relaxation). Right Ventricle: The right ventricular size is normal. No increase in right ventricular wall thickness. Right ventricular systolic function is normal. There is normal pulmonary artery  systolic pressure. The tricuspid regurgitant velocity is 2.09 m/s, and  with an assumed right atrial pressure of 3 mmHg, the estimated right ventricular systolic pressure is 20.5 mmHg. Left Atrium: Left atrial size was normal in size. Right Atrium: Right atrial size was normal in size. Pericardium: There is no evidence of pericardial effusion. Mitral Valve: The mitral valve is normal in structure. No evidence of mitral valve regurgitation. No evidence of mitral valve stenosis. MV peak gradient, 6.9 mmHg. The mean mitral valve gradient is 2.0 mmHg. Tricuspid Valve: The tricuspid valve is normal in structure. Tricuspid valve regurgitation is trivial. No evidence of tricuspid stenosis. Aortic Valve: The aortic valve is normal in structure. Aortic valve regurgitation is not visualized. No aortic stenosis is present. Aortic valve mean gradient measures 6.0 mmHg. Aortic valve peak gradient measures 13.4 mmHg. Aortic valve area, by VTI measures 2.03 cm. Pulmonic Valve: The pulmonic valve was normal in structure. Pulmonic valve regurgitation is not visualized. No evidence of pulmonic stenosis. Aorta: The aortic root is normal in size and structure. Venous: The inferior vena cava is normal in size with greater than 50% respiratory variability, suggesting right atrial pressure of 3 mmHg. IAS/Shunts: No atrial level shunt detected by color flow Doppler.  LEFT VENTRICLE PLAX 2D LVIDd:         4.30 cm   Diastology LVIDs:         2.60 cm   LV e' medial:    5.87 cm/s LV PW:         0.80 cm   LV E/e' medial:  12.1 LV IVS:  1.00 cm   LV e' lateral:   7.83 cm/s LVOT diam:     2.00 cm   LV E/e' lateral: 9.1 LV SV:         69 LV SV Index:   37 LVOT Area:     3.14 cm  RIGHT VENTRICLE RV Basal diam:  3.30 cm RV Mid diam:    2.80 cm RV S prime:     15.10 cm/s TAPSE (M-mode): 2.8 cm LEFT ATRIUM             Index        RIGHT ATRIUM           Index LA diam:        3.50 cm 1.89 cm/m   RA Area:     14.40 cm LA Vol (A2C):   51.2 ml  27.64 ml/m  RA Volume:   32.50 ml  17.54 ml/m LA Vol (A4C):   45.0 ml 24.29 ml/m LA Biplane Vol: 48.3 ml 26.07 ml/m  AORTIC VALVE                     PULMONIC VALVE AV Area (Vmax):    2.21 cm      PV Vmax:       1.19 m/s AV Area (Vmean):   2.00 cm      PV Peak grad:  5.7 mmHg AV Area (VTI):     2.03 cm AV Vmax:           183.00 cm/s AV Vmean:          117.000 cm/s AV VTI:            0.341 m AV Peak Grad:      13.4 mmHg AV Mean Grad:      6.0 mmHg LVOT Vmax:         129.00 cm/s LVOT Vmean:        74.600 cm/s LVOT VTI:          0.220 m LVOT/AV VTI ratio: 0.65  AORTA Ao Root diam: 3.50 cm MITRAL VALVE                TRICUSPID VALVE MV Area (PHT): 2.33 cm     TR Peak grad:   17.5 mmHg MV Area VTI:   1.95 cm     TR Vmax:        209.00 cm/s MV Peak grad:  6.9 mmHg MV Mean grad:  2.0 mmHg     SHUNTS MV Vmax:       1.31 m/s     Systemic VTI:  0.22 m MV Vmean:      61.4 cm/s    Systemic Diam: 2.00 cm MV Decel Time: 326 msec MV E velocity: 71.30 cm/s MV A velocity: 111.00 cm/s MV E/A ratio:  0.64 Lorine Bears MD Electronically signed by Lorine Bears MD Signature Date/Time: 02/24/2023/3:43:12 PM    Final     Disposition Plan & Communication  Patient status: Inpatient  Admitted From: Home Planned disposition location: Home Anticipated discharge date: 8/21 pending clinical improvement  Family Communication: none at bedside    Author: Leeroy Bock, DO [facility] 02/25/2023, 8:16 AM   Available by Epic secure chat 7AM-7PM. If 7PM-7AM, please contact night-coverage.  [facility] contact information found on [email or web address].

## 2023-02-25 NOTE — Plan of Care (Signed)

## 2023-02-26 DIAGNOSIS — I5033 Acute on chronic diastolic (congestive) heart failure: Secondary | ICD-10-CM | POA: Diagnosis not present

## 2023-02-26 LAB — BASIC METABOLIC PANEL
Anion gap: 13 (ref 5–15)
BUN: 26 mg/dL — ABNORMAL HIGH (ref 8–23)
CO2: 30 mmol/L (ref 22–32)
Calcium: 10.3 mg/dL (ref 8.9–10.3)
Chloride: 93 mmol/L — ABNORMAL LOW (ref 98–111)
Creatinine, Ser: 1.03 mg/dL — ABNORMAL HIGH (ref 0.44–1.00)
GFR, Estimated: 56 mL/min — ABNORMAL LOW (ref >60)
Glucose, Bld: 159 mg/dL — ABNORMAL HIGH (ref 70–99)
Potassium: 3.2 mmol/L — ABNORMAL LOW (ref 3.5–5.1)
Sodium: 136 mmol/L (ref 135–145)

## 2023-02-26 LAB — GLUCOSE, CAPILLARY
Glucose-Capillary: 121 mg/dL — ABNORMAL HIGH (ref 70–99)
Glucose-Capillary: 119 mg/dL — ABNORMAL HIGH (ref 70–99)

## 2023-02-26 MED ORDER — POTASSIUM CHLORIDE ER 20 MEQ PO TBCR: 20.0000 meq | EXTENDED_RELEASE_TABLET | Freq: Every day | ORAL | 1 refills | Status: DC

## 2023-02-26 MED ORDER — APIXABAN 5 MG PO TABS: 5.0000 mg | ORAL_TABLET | Freq: Two times a day (BID) | ORAL | 0 refills | Status: DC

## 2023-02-26 MED ORDER — FUROSEMIDE 40 MG PO TABS: 40.0000 mg | ORAL_TABLET | Freq: Every day | ORAL | 1 refills | Status: DC

## 2023-02-26 NOTE — Plan of Care (Signed)

## 2023-02-26 NOTE — Discharge Summary (Signed)
Physician Discharge Summary  Ann Silva ZOX:[id number] DOB: September 05, 1945 DOA: 02/22/2023  PCP: Danella Penton, MD  Admit date: 02/22/2023 Discharge date: 02/26/2023  Admitted From: Home Disposition:  Home w home health  Recommendations for Outpatient Follow-up:  Follow up with PCP in 1-2 weeks Follow up in HF clinic  Home Health:Yes Equipment/Devices:None   Discharge Condition:Stable  CODE STATUS:FULL  Diet recommendation: Heart  Brief/Interim Summary: Ann Silva is a 77 y.o. female with a PMH significant for  chronic HFpEF, PE status post thrombectomy November 2023 on chronic Eliquis, COPD Gold stage II, HTN, GERD, spinal stenosis, morbid obesity, bipolar disorder.   They presented from home to the ED on 02/22/2023 with LEE, SOB, CP x a couple days. Outpatient management with increased lasix was attempted and did not improve symptoms. Woke up with sharp chest pain that prompted her to come to the hospital.    In the ED, it was found that they had Blood pressure borderline low, borderline tachycardia O2 saturation 95% on 2 L.  Significant findings included: CT angiogram negative for PE but pulmonary hypertension and trace bilateral pleural effusion present. Blood work showed K+ 2.5, creatinine 1.2, glucose 219 .   They were initially treated with K+.    Patient was admitted to medicine service for further workup and management of acute on chronic HFpEF exacerbation as outlined in detail below.  8/21: Stabilized.  Medications optimized.  Volume status euvolemic.  Weaned off supplemental oxygen.  Ambulated on the day of discharge.  Did not desaturate.  Does not require home oxygen.  Stable for discharge.  Follow-up outpatient PCP at heart failure clinic.        Discharge Diagnoses:  Principal Problem:   CHF (congestive heart failure) (HCC) Active Problems:   Acute hypoxemic respiratory failure (HCC)   COPD (chronic obstructive pulmonary disease) (HCC)   Acute  congestive heart failure (HCC)   Hypokalemia   Pulmonary hypertension (HCC)   Acute on chronic HFrEF (heart failure with reduced ejection fraction) (HCC)  Acute on chronic HFpEF exacerbation- BNP 181.3 on admission. >4L UOP yesterday. Down about 6kg. Echo showing mild DD.  Patient was aggressively diuresed.  Volume status optimized at time of DC.  Will recommend increasing Lasix dose to 40 mg daily.  Increase K-Dur to 20 mEq daily.  Continue remainder of home medications.  Follow-up outpatient PCP and heart failure.    Discharge Instructions  Discharge Instructions     Diet - low sodium heart healthy   Complete by: As directed    Increase activity slowly   Complete by: As directed    No wound care   Complete by: As directed       Allergies as of 02/26/2023       Reactions   Hydrocodone-chlorpheniramine Shortness Of Breath   Sulfa Antibiotics Shortness Of Breath   Sulfa Antibiotics Shortness Of Breath   Chlorhexidine Rash   Methadone Other (See Comments)   Altered Mental Status   Benadryl [diphenhydramine] Anxiety   Latex Rash   Tape Rash   "the white kind they use after surgery"        Medication List     STOP taking these medications    fluticasone-salmeterol 250-50 MCG/ACT Aepb Commonly known as: ADVAIR   ipratropium-albuterol 0.5-2.5 (3) MG/3ML Soln Commonly known as: DUONEB   Trelegy Ellipta 200-62.5-25 MCG/INH Aepb Generic drug: Fluticasone-Umeclidin-Vilant       TAKE these medications    acetaminophen 500 MG tablet Commonly known as:  TYLENOL Take 1,000 mg by mouth every 6 (six) hours as needed for moderate pain or headache.   albuterol 108 (90 Base) MCG/ACT inhaler Commonly known as: VENTOLIN HFA Inhale 2 puffs into the lungs every 4 (four) hours as needed for wheezing or shortness of breath.   apixaban 5 MG Tabs tablet Commonly known as: ELIQUIS Take 1 tablet (5 mg total) by mouth 2 (two) times daily. Only start this script after completing  eliquis 10mg  BID x 4 days more   ARIPiprazole 5 MG tablet Commonly known as: ABILIFY Take 5 mg by mouth at bedtime.   buPROPion 75 MG tablet Commonly known as: WELLBUTRIN Take 75 mg by mouth 2 (two) times daily. Unsure of dose   furosemide 40 MG tablet Commonly known as: Lasix Take 1 tablet (40 mg total) by mouth daily. What changed:  medication strength how much to take   metoprolol succinate 50 MG 24 hr tablet Commonly known as: TOPROL-XL Take 50 mg by mouth daily.   montelukast 10 MG tablet Commonly known as: SINGULAIR Take 10 mg by mouth daily.   omeprazole 40 MG capsule Commonly known as: PRILOSEC Take 40 mg by mouth daily.   oxybutynin 5 MG tablet Commonly known as: DITROPAN Take 5 mg by mouth 2 (two) times daily.   PARoxetine 40 MG tablet Commonly known as: PAXIL Take 40 mg by mouth daily at 2 PM.   Potassium Chloride ER 20 MEQ Tbcr Take 1 tablet (20 mEq total) by mouth daily. What changed:  medication strength how much to take   rOPINIRole 3 MG tablet Commonly known as: REQUIP Take 1 tablet (3 mg total) by mouth 4 (four) times daily.   spironolactone 25 MG tablet Commonly known as: ALDACTONE Take 25 mg by mouth daily.   temazepam 30 MG capsule Commonly known as: RESTORIL Take 30 mg by mouth at bedtime as needed.        Allergies  Allergen Reactions   Hydrocodone-Chlorpheniramine Shortness Of Breath   Sulfa Antibiotics Shortness Of Breath   Sulfa Antibiotics Shortness Of Breath   Chlorhexidine Rash   Methadone Other (See Comments)    Altered Mental Status   Benadryl [Diphenhydramine] Anxiety   Latex Rash   Tape Rash    "the white kind they use after surgery"    Consultations: Cardiology   Procedures/Studies: ECHOCARDIOGRAM COMPLETE  Result Date: 02/24/2023    ECHOCARDIOGRAM REPORT   Patient Name:   Ann Silva Date of Exam: 02/24/2023 Medical Rec #:  [id number]       Height:       59.0 in Accession #:    [id number]      Weight:        202.1 lb Date of Birth:  29-May-1946       BSA:          1.853 m Patient Age:    77 years        BP:           129/59 mmHg Patient Gender: F               HR:           69 bpm. Exam Location:  [facility] Procedure: 2D Echo, Cardiac Doppler and Color Doppler Indications:     CHF  History:         Patient has prior history of Echocardiogram examinations, most  recent 05/20/2022. CHF, Pulmonary HTN and COPD,                  Signs/Symptoms:Dyspnea; Risk Factors:Hypertension, Sleep Apnea                  and Former Smoker.  Sonographer:     Mikki Harbor Referring Phys:  [id number] Emeline General Diagnosing Phys: Lorine Bears MD  Sonographer Comments: Technically difficult study due to poor echo windows. Image acquisition challenging due to COPD. IMPRESSIONS  1. Left ventricular ejection fraction, by estimation, is 55 to 60%. The left ventricle has normal function. The left ventricle has no regional wall motion abnormalities. Left ventricular diastolic parameters are consistent with Grade I diastolic dysfunction (impaired relaxation).  2. Right ventricular systolic function is normal. The right ventricular size is normal. There is normal pulmonary artery systolic pressure.  3. The mitral valve is normal in structure. No evidence of mitral valve regurgitation. No evidence of mitral stenosis.  4. The aortic valve is normal in structure. Aortic valve regurgitation is not visualized. No aortic stenosis is present.  5. The inferior vena cava is normal in size with greater than 50% respiratory variability, suggesting right atrial pressure of 3 mmHg. FINDINGS  Left Ventricle: Left ventricular ejection fraction, by estimation, is 55 to 60%. The left ventricle has normal function. The left ventricle has no regional wall motion abnormalities. The left ventricular internal cavity size was normal in size. There is  no left ventricular hypertrophy. Left ventricular diastolic parameters are consistent with Grade I  diastolic dysfunction (impaired relaxation). Right Ventricle: The right ventricular size is normal. No increase in right ventricular wall thickness. Right ventricular systolic function is normal. There is normal pulmonary artery systolic pressure. The tricuspid regurgitant velocity is 2.09 m/s, and  with an assumed right atrial pressure of 3 mmHg, the estimated right ventricular systolic pressure is 20.5 mmHg. Left Atrium: Left atrial size was normal in size. Right Atrium: Right atrial size was normal in size. Pericardium: There is no evidence of pericardial effusion. Mitral Valve: The mitral valve is normal in structure. No evidence of mitral valve regurgitation. No evidence of mitral valve stenosis. MV peak gradient, 6.9 mmHg. The mean mitral valve gradient is 2.0 mmHg. Tricuspid Valve: The tricuspid valve is normal in structure. Tricuspid valve regurgitation is trivial. No evidence of tricuspid stenosis. Aortic Valve: The aortic valve is normal in structure. Aortic valve regurgitation is not visualized. No aortic stenosis is present. Aortic valve mean gradient measures 6.0 mmHg. Aortic valve peak gradient measures 13.4 mmHg. Aortic valve area, by VTI measures 2.03 cm. Pulmonic Valve: The pulmonic valve was normal in structure. Pulmonic valve regurgitation is not visualized. No evidence of pulmonic stenosis. Aorta: The aortic root is normal in size and structure. Venous: The inferior vena cava is normal in size with greater than 50% respiratory variability, suggesting right atrial pressure of 3 mmHg. IAS/Shunts: No atrial level shunt detected by color flow Doppler.  LEFT VENTRICLE PLAX 2D LVIDd:         4.30 cm   Diastology LVIDs:         2.60 cm   LV e' medial:    5.87 cm/s LV PW:         0.80 cm   LV E/e' medial:  12.1 LV IVS:        1.00 cm   LV e' lateral:   7.83 cm/s LVOT diam:     2.00 cm   LV E/e'  lateral: 9.1 LV SV:         69 LV SV Index:   37 LVOT Area:     3.14 cm  RIGHT VENTRICLE RV Basal diam:  3.30  cm RV Mid diam:    2.80 cm RV S prime:     15.10 cm/s TAPSE (M-mode): 2.8 cm LEFT ATRIUM             Index        RIGHT ATRIUM           Index LA diam:        3.50 cm 1.89 cm/m   RA Area:     14.40 cm LA Vol (A2C):   51.2 ml 27.64 ml/m  RA Volume:   32.50 ml  17.54 ml/m LA Vol (A4C):   45.0 ml 24.29 ml/m LA Biplane Vol: 48.3 ml 26.07 ml/m  AORTIC VALVE                     PULMONIC VALVE AV Area (Vmax):    2.21 cm      PV Vmax:       1.19 m/s AV Area (Vmean):   2.00 cm      PV Peak grad:  5.7 mmHg AV Area (VTI):     2.03 cm AV Vmax:           183.00 cm/s AV Vmean:          117.000 cm/s AV VTI:            0.341 m AV Peak Grad:      13.4 mmHg AV Mean Grad:      6.0 mmHg LVOT Vmax:         129.00 cm/s LVOT Vmean:        74.600 cm/s LVOT VTI:          0.220 m LVOT/AV VTI ratio: 0.65  AORTA Ao Root diam: 3.50 cm MITRAL VALVE                TRICUSPID VALVE MV Area (PHT): 2.33 cm     TR Peak grad:   17.5 mmHg MV Area VTI:   1.95 cm     TR Vmax:        209.00 cm/s MV Peak grad:  6.9 mmHg MV Mean grad:  2.0 mmHg     SHUNTS MV Vmax:       1.31 m/s     Systemic VTI:  0.22 m MV Vmean:      61.4 cm/s    Systemic Diam: 2.00 cm MV Decel Time: 326 msec MV E velocity: 71.30 cm/s MV A velocity: 111.00 cm/s MV E/A ratio:  0.64 Lorine Bears MD Electronically signed by Lorine Bears MD Signature Date/Time: 02/24/2023/3:43:12 PM    Final    CT Angio Chest PE W and/or Wo Contrast  Result Date: 02/22/2023 CLINICAL DATA:  Shortness of breath and chest pain, concern for pulmonary embolism. EXAM: CT ANGIOGRAPHY CHEST WITH CONTRAST TECHNIQUE: Multidetector CT imaging of the chest was performed using the standard protocol during bolus administration of intravenous contrast. Multiplanar CT image reconstructions and MIPs were obtained to evaluate the vascular anatomy. RADIATION DOSE REDUCTION: This exam was performed according to the departmental dose-optimization program which includes automated exposure control, adjustment of  the mA and/or kV according to patient size and/or use of iterative reconstruction technique. CONTRAST:  75mL OMNIPAQUE IOHEXOL 350 MG/ML SOLN COMPARISON:  Chest radiograph performed the same day and CT  chest dated 08/12/2022. FINDINGS: Cardiovascular: Satisfactory opacification of the pulmonary arteries to the segmental level. No evidence of pulmonary embolism. The right and left pulmonary arteries are mildly enlarged, measuring 2.5 cm each, suggestive of pulmonary hypertension. Vascular calcifications are seen in the aortic arch. The ascending thoracic aorta is normal in size. Normal heart size. There is trace pericardial fluid. Mediastinum/Nodes: No enlarged mediastinal, hilar, or axillary lymph nodes. Thyroid gland, trachea, and esophagus demonstrate no significant findings. Lungs/Pleura: Bilateral emphysematous changes. There is mild-to-moderate bilateral dependent atelectasis. No pleural effusion or pneumothorax. A 3 mm solid pulmonary nodule in the left upper lobe is not significantly changed since at least 12/31/2019 and is therefore benign. No imaging follow-up is recommended for this finding. Upper Abdomen: A partially imaged left renal cyst measures 3.0 cm in diameter. No imaging follow-up is recommended for this finding. Musculoskeletal: Degenerative changes are seen in the spine. Review of the MIP images confirms the above findings. IMPRESSION: 1. No evidence of pulmonary embolism. 2. Mild-to-moderate bilateral dependent atelectasis. 3. Mildly enlarged right and left pulmonary arteries, suggestive of pulmonary hypertension. Aortic Atherosclerosis (ICD10-I70.0) and Emphysema (ICD10-J43.9). Electronically Signed   By: Romona Curls M.D.   On: 02/22/2023 14:01   DG Chest 2 View  Result Date: 02/22/2023 CLINICAL DATA:  chest pain EXAM: CHEST - 2 VIEW COMPARISON:  08/12/2022 FINDINGS: The heart size and mediastinal contours are within normal limits. Both lungs are clear. The visualized skeletal structures  are unremarkable. IMPRESSION: No active cardiopulmonary disease. Electronically Signed   By: Signa Kell M.D.   On: 02/22/2023 12:23      Subjective: Seen and examined on the day of discharge.  Stable no distress.  Appropriate discharge home.  Discharge Exam: Vitals:   02/26/23 0813 02/26/23 1229  BP: 129/65 (!) 131/92  Pulse: (!) 58 77  Resp: 20 20  Temp: 98 F (36.7 C) 97.9 F (36.6 C)  SpO2: 91% 91%   Vitals:   02/26/23 0438 02/26/23 0500 02/26/23 0813 02/26/23 1229  BP: 138/84  129/65 (!) 131/92  Pulse: 66  (!) 58 77  Resp: 19  20 20   Temp: 97.9 F (36.6 C)  98 F (36.7 C) 97.9 F (36.6 C)  TempSrc: Oral     SpO2: 97%  91% 91%  Weight:  90 kg    Height:        General: Pt is alert, awake, not in acute distress Cardiovascular: RRR, S1/S2 +, no rubs, no gallops Respiratory: CTA bilaterally, no wheezing, no rhonchi Abdominal: Soft, NT, ND, bowel sounds + Extremities: no edema, no cyanosis    The results of significant diagnostics from this hospitalization (including imaging, microbiology, ancillary and laboratory) are listed below for reference.     Microbiology: No results found for this or any previous visit (from the past 240 hour(s)).   Labs: BNP (last 3 results) Recent Labs    05/19/22 1348 08/12/22 1613 02/22/23 1111  BNP 377.8* 93.5 181.3*   Basic Metabolic Panel: Recent Labs  Lab 02/22/23 1111 02/23/23 0510 02/23/23 1258 02/24/23 0502 02/25/23 0443 02/25/23 1321 02/26/23 0651  NA 136   < > 137 138 137 137 136  K 2.5*   < > 3.4* 3.0* 3.0* 3.9 3.2*  CL 94*   < > 91* 91* 93* 90* 93*  CO2 31   < > 32 36* 34* 33* 30  GLUCOSE 219*   < > 152* 106* 119* 151* 159*  BUN 20   < > 18 22 23  29* 26*  CREATININE 1.24*   < > 1.01* 0.99 1.00 1.15* 1.03*  CALCIUM 10.2   < > 10.7* 10.0 10.2 10.8* 10.3  MG 2.0  --   --  2.0  --   --   --   PHOS 2.0*  --   --   --   --   --   --    < > = values in this interval not displayed.   Liver Function  Tests: No results for input(s): "AST", "ALT", "ALKPHOS", "BILITOT", "PROT", "ALBUMIN" in the last 168 hours. No results for input(s): "LIPASE", "AMYLASE" in the last 168 hours. No results for input(s): "AMMONIA" in the last 168 hours. CBC: Recent Labs  Lab 02/22/23 1111  WBC 9.2  HGB 13.0  HCT 40.3  MCV 94.4  PLT 333   Cardiac Enzymes: No results for input(s): "CKTOTAL", "CKMB", "CKMBINDEX", "TROPONINI" in the last 168 hours. BNP: Invalid input(s): "POCBNP" CBG: Recent Labs  Lab 02/25/23 1150 02/25/23 1651 02/25/23 2148 02/26/23 0809 02/26/23 1226  GLUCAP 174* 132* 185* 121* 119*   D-Dimer No results for input(s): "DDIMER" in the last 72 hours. Hgb A1c No results for input(s): "HGBA1C" in the last 72 hours. Lipid Profile No results for input(s): "CHOL", "HDL", "LDLCALC", "TRIG", "CHOLHDL", "LDLDIRECT" in the last 72 hours. Thyroid function studies No results for input(s): "TSH", "T4TOTAL", "T3FREE", "THYROIDAB" in the last 72 hours.  Invalid input(s): "FREET3" Anemia work up No results for input(s): "VITAMINB12", "FOLATE", "FERRITIN", "TIBC", "IRON", "RETICCTPCT" in the last 72 hours. Urinalysis    Component Value Date/Time   COLORURINE YELLOW (A) 08/12/2022 1613   APPEARANCEUR CLOUDY (A) 08/12/2022 1613   APPEARANCEUR Cloudy (A) 06/28/2019 1558   LABSPEC 1.013 08/12/2022 1613   LABSPEC 1.009 03/15/2012 2143   PHURINE 6.0 08/12/2022 1613   GLUCOSEU NEGATIVE 08/12/2022 1613   GLUCOSEU Negative 03/15/2012 2143   HGBUR MODERATE (A) 08/12/2022 1613   BILIRUBINUR NEGATIVE 08/12/2022 1613   BILIRUBINUR Negative 06/28/2019 1558   BILIRUBINUR Negative 03/15/2012 2143   KETONESUR NEGATIVE 08/12/2022 1613   PROTEINUR 100 (A) 08/12/2022 1613   NITRITE POSITIVE (A) 08/12/2022 1613   LEUKOCYTESUR LARGE (A) 08/12/2022 1613   LEUKOCYTESUR 1+ 03/15/2012 2143   Sepsis Labs Recent Labs  Lab 02/22/23 1111  WBC 9.2   Microbiology No results found for this or any  previous visit (from the past 240 hour(s)).   Time coordinating discharge: Over 30 minutes  SIGNED:   Tresa Moore, MD  [facility] 02/26/2023, 2:30 PM Pager   If 7PM-7AM, please contact night-coverage

## 2023-02-26 NOTE — TOC Transition Note (Signed)
Transition of Care [facility]) - CM/SW Discharge Note   Patient Details  Name: Ann Silva MRN: [id number] Date of Birth: 01-17-1946  [facility] CM/SW Contact:  Darolyn Rua, LCSW Phone Number: 02/26/2023, 9:36 AM   Clinical Narrative:     8/21: Patient to discharge home today with [facility] home health PT and OT. Kelsey with [facility] informed of discharge today, no additional needs.       8/19: CSW spoke with patient regarding home health recommendations, she is in agreement with HH PT and OT, no preference of agency. Reports no dme needs. Confirms she continue to see Dr. Hyacinth Meeker as her PCP and she uses [facility] pharmacy on [address]. Reports no dme needs as she has a walker at home.    CSW has made referral to [facility] and OT. They were able to accept.   Final next level of care: Home w Home Health Services Barriers to Discharge: No Barriers Identified   Patient Goals and CMS Choice CMS Medicare.gov Compare Post Acute Care list provided to:: Patient Choice offered to / list presented to : Patient  Discharge Placement                         Discharge Plan and Services Additional resources added to the After Visit Summary for                                       Social Determinants of Health (SDOH) Interventions SDOH Screenings   Food Insecurity: No Food Insecurity (02/22/2023)  Housing: Patient Declined (02/22/2023)  Transportation Needs: No Transportation Needs (02/22/2023)  Utilities: Patient Declined (02/22/2023)  Financial Resource Strain: Low Risk  (01/22/2023)   Received from [facility]  Tobacco Use: Medium Risk (02/22/2023)     Readmission Risk Interventions     No data to display

## 2023-02-27 NOTE — Progress Notes (Deleted)
Advanced Heart Failure Clinic Note   Referring Physician: hospitalist PCP: Ann Penton, MD (last seen 08/24) PCP-Cardiologist: None   HPI:  [name] is a 77 y/o female with a history of  Admitted 02/22/23 due to edema, SOB, CP x a couple days. Outpatient management with increased lasix was attempted and did not improve symptoms. Woke up with sharp chest pain that prompted her to come to the hospital. Blood pressure borderline low, borderline tachycardia O2 saturation 95% on 2 L. CT angiogram negative for PE but pulmonary hypertension and trace bilateral pleural effusion present. Echo showed mild DD. Blood work showed K+ 2.5. Diuresed >4L.    Echo 12/30/16: EF 55-60% with mild LAE Echo 05/20/22: EF 50-55% along with Grade I DD Echo 02/24/23: EF 55-60% along with Grade I DD  She presents today for her initial visit with a chief complaint of   Review of Systems: [y] = yes, [ ]  = no   General: Weight gain [ ] ; Weight loss [ ] ; Anorexia [ ] ; Fatigue [ ] ; Fever [ ] ; Chills [ ] ; Weakness [ ]   Cardiac: Chest pain/pressure [ ] ; Resting SOB [ ] ; Exertional SOB [ ] ; Orthopnea [ ] ; Pedal Edema [ ] ; Palpitations [ ] ; Syncope [ ] ; Presyncope [ ] ; Paroxysmal nocturnal dyspnea[ ]   Pulmonary: Cough [ ] ; Wheezing[ ] ; Hemoptysis[ ] ; Sputum [ ] ; Snoring [ ]   GI: Vomiting[ ] ; Dysphagia[ ] ; Melena[ ] ; Hematochezia [ ] ; Heartburn[ ] ; Abdominal pain [ ] ; Constipation [ ] ; Diarrhea [ ] ; BRBPR [ ]   GU: Hematuria[ ] ; Dysuria [ ] ; Nocturia[ ]   Vascular: Pain in legs with walking [ ] ; Pain in feet with lying flat [ ] ; Non-healing sores [ ] ; Stroke [ ] ; TIA [ ] ; Slurred speech [ ] ;  Neuro: Headaches[ ] ; Vertigo[ ] ; Seizures[ ] ; Paresthesias[ ] ;Blurred vision [ ] ; Diplopia [ ] ; Vision changes [ ]   Ortho/Skin: Arthritis [ ] ; Joint pain [ ] ; Muscle pain [ ] ; Joint swelling [ ] ; Back Pain [ ] ; Rash [ ]   Psych: Depression[ ] ; Anxiety[ ]   Heme: Bleeding problems [ ] ; Clotting disorders [ ] ; Anemia [ ]   Endocrine:  Diabetes [ ] ; Thyroid dysfunction[ ]    Past Medical History:  Diagnosis Date   Anemia    takes ferrous sulfate daily   Anxiety    Arthritis    Bladder spasm    Chronic back pain    spinal stenosis   Colon cancer (HCC)    Complication of anesthesia    B/P bottoms out, hard to wake up    Contact dermatitis    COPD (chronic obstructive pulmonary disease) (HCC)    Albuterol inhaler and DuoNeb as needed   COPD (chronic obstructive pulmonary disease) (HCC)    Coronary artery disease    mild by 08/25/09 cath   Foot drop, left    Headache    Heart murmur    never had any problems   History of blood transfusion    no abnormal reaction noted   History of bronchitis    History of MRSA infection 6+ yrs ago   Hypertension    takes Metoprolol daily   Hypertension    Insomnia    takes Trazodone nightly   Joint pain    Joint swelling    Lower extremity edema    Nocturia    Panic attacks    takes Effexor daily   Peripheral edema    takes Lasix daily and takes Bumex every 3 days.Takes Aldactone  daily    Pulmonary emboli (HCC) 2018   Restless leg syndrome    takes Requip daily   RLS (restless legs syndrome)    Sleep apnea    doesnt use CPAP    Current Outpatient Medications  Medication Sig Dispense Refill   acetaminophen (TYLENOL) 500 MG tablet Take 1,000 mg by mouth every 6 (six) hours as needed for moderate pain or headache.     albuterol (PROVENTIL HFA;VENTOLIN HFA) 108 (90 Base) MCG/ACT inhaler Inhale 2 puffs into the lungs every 4 (four) hours as needed for wheezing or shortness of breath. 1 Inhaler 0   apixaban (ELIQUIS) 5 MG TABS tablet Take 1 tablet (5 mg total) by mouth 2 (two) times daily. Only start this script after completing eliquis 10mg  BID x 4 days more 60 tablet 0   ARIPiprazole (ABILIFY) 5 MG tablet Take 5 mg by mouth at bedtime.     buPROPion (WELLBUTRIN) 75 MG tablet Take 75 mg by mouth 2 (two) times daily. Unsure of dose     furosemide (LASIX) 40 MG tablet  Take 1 tablet (40 mg total) by mouth daily. 30 tablet 1   metoprolol succinate (TOPROL-XL) 50 MG 24 hr tablet Take 50 mg by mouth daily.     montelukast (SINGULAIR) 10 MG tablet Take 10 mg by mouth daily.     omeprazole (PRILOSEC) 40 MG capsule Take 40 mg by mouth daily.     oxybutynin (DITROPAN) 5 MG tablet Take 5 mg by mouth 2 (two) times daily.     PARoxetine (PAXIL) 40 MG tablet Take 40 mg by mouth daily at 2 PM.      potassium chloride 20 MEQ TBCR Take 1 tablet (20 mEq total) by mouth daily. 30 tablet 1   rOPINIRole (REQUIP) 3 MG tablet Take 1 tablet (3 mg total) by mouth 4 (four) times daily. 360 tablet 4   spironolactone (ALDACTONE) 25 MG tablet Take 25 mg by mouth daily.     temazepam (RESTORIL) 30 MG capsule Take 30 mg by mouth at bedtime as needed.     No current facility-administered medications for this visit.    Allergies  Allergen Reactions   Hydrocodone-Chlorpheniramine Shortness Of Breath   Sulfa Antibiotics Shortness Of Breath   Sulfa Antibiotics Shortness Of Breath   Chlorhexidine Rash   Methadone Other (See Comments)    Altered Mental Status   Benadryl [Diphenhydramine] Anxiety   Latex Rash   Tape Rash    "the white kind they use after surgery"      Social History   Socioeconomic History   Marital status: Married    Spouse name: Not on file   Number of children: Not on file   Years of education: Not on file   Highest education level: Not on file  Occupational History   Not on file  Tobacco Use   Smoking status: Former    Types: Cigarettes   Smokeless tobacco: Never  Vaping Use   Vaping status: Never Used  Substance and Sexual Activity   Alcohol use: No   Drug use: No   Sexual activity: Not on file  Other Topics Concern   Not on file  Social History Narrative   ** Merged History Encounter **       Social Determinants of Health   Financial Resource Strain: Low Risk  (01/22/2023)   Received from [facility]   Overall  Financial Resource Strain (CARDIA)    Difficulty of Paying Living Expenses: Not  hard at all  Food Insecurity: No Food Insecurity (02/22/2023)   Hunger Vital Sign    Worried About Running Out of Food in the Last Year: Never true    Ran Out of Food in the Last Year: Never true  Transportation Needs: No Transportation Needs (02/22/2023)   PRAPARE - Administrator, Civil Service (Medical): No    Lack of Transportation (Non-Medical): No  Physical Activity: Not on file  Stress: Not on file  Social Connections: Not on file  Intimate Partner Violence: Not At Risk (02/22/2023)   Humiliation, Afraid, Rape, and Kick questionnaire    Fear of Current or Ex-Partner: No    Emotionally Abused: No    Physically Abused: No    Sexually Abused: No      Family History  Problem Relation Age of Onset   Cancer Mother    Stroke Father    Diabetes Sister    Mental illness Sister        PHYSICAL EXAM: General:  Well appearing. No respiratory difficulty HEENT: normal Neck: supple. no JVD. Carotids 2+ bilat; no bruits. No lymphadenopathy or thyromegaly appreciated. Cor: PMI nondisplaced. Regular rate & rhythm. No rubs, gallops or murmurs. Lungs: clear Abdomen: soft, nontender, nondistended. No hepatosplenomegaly. No bruits or masses. Good bowel sounds. Extremities: no cyanosis, clubbing, rash, edema Neuro: alert & oriented x 3, cranial nerves grossly intact. moves all 4 extremities w/o difficulty. Affect pleasant.  ECG:   ASSESSMENT & PLAN:  1: Chronic heart failure with preserved ejection fraction- - suspect due to - NYHA class - euvolemic today - weighing daily - Echo 12/30/16: EF 55-60% with mild LAE - Echo 05/20/22: EF 50-55% along with Grade I DD - Echo 02/24/23: EF 55-60% along with Grade I DD - continue  - BNP 02/22/23 was 181.3  2: HTN- - BP - saw PCP Ann Silva) 08/24 - BMP 02/26/23 showed sodium 136, potassium 3.2, creatinine 1.03 & GFR 56  3: COPD- -  4: PE- -  status post thrombectomy November 2023  - continue eliquis   Ann Silva, [place] 02/27/23

## 2023-02-28 ENCOUNTER — Encounter: Payer: Medicare PPO | Admitting: Family

## 2023-03-03 DIAGNOSIS — I2781 Cor pulmonale (chronic): Secondary | ICD-10-CM | POA: Diagnosis present

## 2023-03-07 ENCOUNTER — Encounter: Payer: Medicare PPO | Admitting: Family

## 2023-03-31 ENCOUNTER — Inpatient Hospital Stay
Admission: EM | Admit: 2023-03-31 | Discharge: 2023-04-06 | DRG: 683 | Disposition: A | Discharge status: Home Health | Payer: Medicare PPO | Attending: Internal Medicine | Admitting: Internal Medicine

## 2023-03-31 ENCOUNTER — Emergency Department: Payer: Medicare PPO

## 2023-03-31 DIAGNOSIS — M549 Dorsalgia, unspecified: Secondary | ICD-10-CM | POA: Diagnosis present

## 2023-03-31 DIAGNOSIS — Z87891 Personal history of nicotine dependence: Secondary | ICD-10-CM

## 2023-03-31 DIAGNOSIS — Z85038 Personal history of other malignant neoplasm of large intestine: Secondary | ICD-10-CM

## 2023-03-31 DIAGNOSIS — Z1152 Encounter for screening for COVID-19: Secondary | ICD-10-CM

## 2023-03-31 DIAGNOSIS — I2510 Atherosclerotic heart disease of native coronary artery without angina pectoris: Secondary | ICD-10-CM | POA: Diagnosis present

## 2023-03-31 DIAGNOSIS — G4700 Insomnia, unspecified: Secondary | ICD-10-CM | POA: Diagnosis present

## 2023-03-31 DIAGNOSIS — N179 Acute kidney failure, unspecified: Principal | ICD-10-CM | POA: Diagnosis present

## 2023-03-31 DIAGNOSIS — I509 Heart failure, unspecified: Secondary | ICD-10-CM

## 2023-03-31 DIAGNOSIS — Z91199 Patient's noncompliance with other medical treatment and regimen due to unspecified reason: Secondary | ICD-10-CM

## 2023-03-31 DIAGNOSIS — Z961 Presence of intraocular lens: Secondary | ICD-10-CM | POA: Diagnosis present

## 2023-03-31 DIAGNOSIS — E876 Hypokalemia: Secondary | ICD-10-CM | POA: Diagnosis not present

## 2023-03-31 DIAGNOSIS — Z7901 Long term (current) use of anticoagulants: Secondary | ICD-10-CM

## 2023-03-31 DIAGNOSIS — M109 Gout, unspecified: Secondary | ICD-10-CM | POA: Diagnosis present

## 2023-03-31 DIAGNOSIS — Z96643 Presence of artificial hip joint, bilateral: Secondary | ICD-10-CM | POA: Diagnosis present

## 2023-03-31 DIAGNOSIS — I890 Lymphedema, not elsewhere classified: Secondary | ICD-10-CM | POA: Diagnosis present

## 2023-03-31 DIAGNOSIS — I110 Hypertensive heart disease with heart failure: Secondary | ICD-10-CM | POA: Diagnosis present

## 2023-03-31 DIAGNOSIS — Z9981 Dependence on supplemental oxygen: Secondary | ICD-10-CM

## 2023-03-31 DIAGNOSIS — I4510 Unspecified right bundle-branch block: Secondary | ICD-10-CM | POA: Diagnosis present

## 2023-03-31 DIAGNOSIS — E872 Acidosis, unspecified: Secondary | ICD-10-CM | POA: Diagnosis present

## 2023-03-31 DIAGNOSIS — G2581 Restless legs syndrome: Secondary | ICD-10-CM | POA: Diagnosis present

## 2023-03-31 DIAGNOSIS — Z91040 Latex allergy status: Secondary | ICD-10-CM

## 2023-03-31 DIAGNOSIS — J9611 Chronic respiratory failure with hypoxia: Secondary | ICD-10-CM | POA: Diagnosis present

## 2023-03-31 DIAGNOSIS — G8929 Other chronic pain: Secondary | ICD-10-CM | POA: Diagnosis present

## 2023-03-31 DIAGNOSIS — J449 Chronic obstructive pulmonary disease, unspecified: Secondary | ICD-10-CM | POA: Diagnosis present

## 2023-03-31 DIAGNOSIS — M21372 Foot drop, left foot: Secondary | ICD-10-CM | POA: Diagnosis present

## 2023-03-31 DIAGNOSIS — Z8614 Personal history of Methicillin resistant Staphylococcus aureus infection: Secondary | ICD-10-CM

## 2023-03-31 DIAGNOSIS — Z91048 Other nonmedicinal substance allergy status: Secondary | ICD-10-CM

## 2023-03-31 DIAGNOSIS — F410 Panic disorder [episodic paroxysmal anxiety] without agoraphobia: Secondary | ICD-10-CM | POA: Diagnosis present

## 2023-03-31 DIAGNOSIS — R531 Weakness: Secondary | ICD-10-CM | POA: Diagnosis not present

## 2023-03-31 DIAGNOSIS — N390 Urinary tract infection, site not specified: Secondary | ICD-10-CM | POA: Diagnosis present

## 2023-03-31 DIAGNOSIS — Z888 Allergy status to other drugs, medicaments and biological substances status: Secondary | ICD-10-CM

## 2023-03-31 DIAGNOSIS — Z90710 Acquired absence of both cervix and uterus: Secondary | ICD-10-CM

## 2023-03-31 DIAGNOSIS — I5032 Chronic diastolic (congestive) heart failure: Secondary | ICD-10-CM | POA: Diagnosis not present

## 2023-03-31 DIAGNOSIS — Z882 Allergy status to sulfonamides status: Secondary | ICD-10-CM

## 2023-03-31 DIAGNOSIS — G4733 Obstructive sleep apnea (adult) (pediatric): Secondary | ICD-10-CM | POA: Diagnosis present

## 2023-03-31 DIAGNOSIS — R600 Localized edema: Secondary | ICD-10-CM

## 2023-03-31 DIAGNOSIS — Z7951 Long term (current) use of inhaled steroids: Secondary | ICD-10-CM

## 2023-03-31 DIAGNOSIS — Z6841 Body Mass Index (BMI) 40.0 and over, adult: Secondary | ICD-10-CM

## 2023-03-31 DIAGNOSIS — Z79899 Other long term (current) drug therapy: Secondary | ICD-10-CM

## 2023-03-31 DIAGNOSIS — Z885 Allergy status to narcotic agent status: Secondary | ICD-10-CM

## 2023-03-31 DIAGNOSIS — Z86711 Personal history of pulmonary embolism: Secondary | ICD-10-CM

## 2023-03-31 DIAGNOSIS — Z9049 Acquired absence of other specified parts of digestive tract: Secondary | ICD-10-CM

## 2023-03-31 DIAGNOSIS — Z9682 Presence of neurostimulator: Secondary | ICD-10-CM

## 2023-03-31 DIAGNOSIS — Z87440 Personal history of urinary (tract) infections: Secondary | ICD-10-CM

## 2023-03-31 DIAGNOSIS — I2781 Cor pulmonale (chronic): Secondary | ICD-10-CM | POA: Diagnosis present

## 2023-03-31 DIAGNOSIS — E210 Primary hyperparathyroidism: Secondary | ICD-10-CM | POA: Diagnosis present

## 2023-03-31 LAB — CBC WITH DIFFERENTIAL/PLATELET
Abs Immature Granulocytes: 0.09 10*3/uL — ABNORMAL HIGH (ref 0.00–0.07)
Basophils Absolute: 0 10*3/uL (ref 0.0–0.1)
Basophils Relative: 0 %
Eosinophils Absolute: 0.1 10*3/uL (ref 0.0–0.5)
Eosinophils Relative: 1 %
HCT: 39.8 % (ref 36.0–46.0)
Hemoglobin: 12.9 g/dL (ref 12.0–15.0)
Immature Granulocytes: 1 %
Lymphocytes Relative: 9 %
Lymphs Abs: 0.8 10*3/uL (ref 0.7–4.0)
MCH: 29.9 pg (ref 26.0–34.0)
MCHC: 32.4 g/dL (ref 30.0–36.0)
MCV: 92.1 fL (ref 80.0–100.0)
Monocytes Absolute: 0.6 10*3/uL (ref 0.1–1.0)
Monocytes Relative: 6 %
Neutro Abs: 7.4 10*3/uL (ref 1.7–7.7)
Neutrophils Relative %: 83 %
Platelets: 480 10*3/uL — ABNORMAL HIGH (ref 150–400)
RBC: 4.32 MIL/uL (ref 3.87–5.11)
RDW: 15.3 % (ref 11.5–15.5)
WBC: 9 10*3/uL (ref 4.0–10.5)
nRBC: 0 % (ref 0.0–0.2)

## 2023-03-31 LAB — COMPREHENSIVE METABOLIC PANEL
ALT: 20 U/L (ref 0–44)
AST: 34 U/L (ref 15–41)
Albumin: 3.4 g/dL — ABNORMAL LOW (ref 3.5–5.0)
Alkaline Phosphatase: 107 U/L (ref 38–126)
Anion gap: 16 — ABNORMAL HIGH (ref 5–15)
BUN: 30 mg/dL — ABNORMAL HIGH (ref 8–23)
CO2: 29 mmol/L (ref 22–32)
Calcium: 11.1 mg/dL — ABNORMAL HIGH (ref 8.9–10.3)
Chloride: 88 mmol/L — ABNORMAL LOW (ref 98–111)
Creatinine, Ser: 1.58 mg/dL — ABNORMAL HIGH (ref 0.44–1.00)
GFR, Estimated: 33 mL/min — ABNORMAL LOW (ref >60)
Glucose, Bld: 168 mg/dL — ABNORMAL HIGH (ref 70–99)
Potassium: 2.6 mmol/L — CL (ref 3.5–5.1)
Sodium: 133 mmol/L — ABNORMAL LOW (ref 135–145)
Total Bilirubin: 0.9 mg/dL (ref 0.3–1.2)
Total Protein: 8.6 g/dL — ABNORMAL HIGH (ref 6.5–8.1)

## 2023-03-31 LAB — URINALYSIS, ROUTINE W REFLEX MICROSCOPIC
Bilirubin Urine: NEGATIVE
Glucose, UA: NEGATIVE mg/dL
Hgb urine dipstick: NEGATIVE
Ketones, ur: NEGATIVE mg/dL
Nitrite: NEGATIVE
Protein, ur: NEGATIVE mg/dL
Specific Gravity, Urine: 1.012 (ref 1.005–1.030)
pH: 5 (ref 5.0–8.0)

## 2023-03-31 LAB — SARS CORONAVIRUS 2 BY RT PCR: SARS Coronavirus 2 by RT PCR: NEGATIVE

## 2023-03-31 LAB — BLOOD GAS, VENOUS
Acid-Base Excess: 11.9 mmol/L — ABNORMAL HIGH (ref 0.0–2.0)
Bicarbonate: 38.2 mmol/L — ABNORMAL HIGH (ref 20.0–28.0)
O2 Saturation: 79.5 %
Patient temperature: 37
pCO2, Ven: 55 mmHg (ref 44–60)
pH, Ven: 7.45 — ABNORMAL HIGH (ref 7.25–7.43)
pO2, Ven: 50 mmHg — ABNORMAL HIGH (ref 32–45)

## 2023-03-31 LAB — TROPONIN I (HIGH SENSITIVITY)
Troponin I (High Sensitivity): <2 ng/L (ref <18)
Troponin I (High Sensitivity): <2 ng/L (ref <18)

## 2023-03-31 LAB — MAGNESIUM: Magnesium: 2.3 mg/dL (ref 1.7–2.4)

## 2023-03-31 LAB — TSH: TSH: 0.751 u[IU]/mL (ref 0.350–4.500)

## 2023-03-31 LAB — BRAIN NATRIURETIC PEPTIDE: B Natriuretic Peptide: 42.2 pg/mL (ref 0.0–100.0)

## 2023-03-31 LAB — CK: Total CK: 117 U/L (ref 38–234)

## 2023-03-31 LAB — LACTIC ACID, PLASMA
Lactic Acid, Venous: 4.2 mmol/L (ref 0.5–1.9)
Lactic Acid, Venous: 1.8 mmol/L (ref 0.5–1.9)

## 2023-03-31 MED ORDER — ACETAMINOPHEN 325 MG PO TABS
650.0000 mg | ORAL_TABLET | Freq: Four times a day (QID) | ORAL | Status: DC | PRN
  Administered 2023-03-31 – 2023-04-03 (×4): 650 mg via ORAL
  Filled 2023-03-31 (×4): qty 2

## 2023-03-31 MED ORDER — BUPROPION HCL 75 MG PO TABS
75.0000 mg | ORAL_TABLET | Freq: Two times a day (BID) | ORAL | Status: DC
  Administered 2023-03-31 – 2023-04-06 (×12): 75 mg via ORAL
  Filled 2023-03-31 (×13): qty 1

## 2023-03-31 MED ORDER — PRAMIPEXOLE DIHYDROCHLORIDE 1 MG PO TABS
1.0000 mg | ORAL_TABLET | Freq: Three times a day (TID) | ORAL | Status: DC
  Administered 2023-03-31 – 2023-04-06 (×17): 1 mg via ORAL
  Filled 2023-03-31 (×19): qty 1

## 2023-03-31 MED ORDER — ONDANSETRON HCL 4 MG PO TABS: 4.0000 mg | ORAL_TABLET | Freq: Four times a day (QID) | ORAL | Status: DC | PRN

## 2023-03-31 MED ORDER — ARIPIPRAZOLE 5 MG PO TABS
5.0000 mg | ORAL_TABLET | Freq: Every day | ORAL | Status: DC
  Administered 2023-03-31 – 2023-04-05 (×6): 5 mg via ORAL
  Filled 2023-03-31 (×6): qty 1

## 2023-03-31 MED ORDER — ALBUTEROL SULFATE (2.5 MG/3ML) 0.083% IN NEBU: 2.5000 mg | INHALATION_SOLUTION | RESPIRATORY_TRACT | Status: DC | PRN

## 2023-03-31 MED ORDER — FUROSEMIDE 10 MG/ML IJ SOLN: 40.0000 mg | Freq: Once | INTRAMUSCULAR | Status: DC

## 2023-03-31 MED ORDER — PAROXETINE HCL 20 MG PO TABS
40.0000 mg | ORAL_TABLET | Freq: Every day | ORAL | Status: DC
  Administered 2023-04-01 – 2023-04-05 (×5): 40 mg via ORAL
  Filled 2023-03-31 (×6): qty 2

## 2023-03-31 MED ORDER — LACTATED RINGERS IV SOLN: INTRAVENOUS | Status: AC

## 2023-03-31 MED ORDER — TEMAZEPAM 15 MG PO CAPS
30.0000 mg | ORAL_CAPSULE | Freq: Every evening | ORAL | Status: DC | PRN
  Administered 2023-04-02: 30 mg via ORAL
  Filled 2023-03-31 (×2): qty 1

## 2023-03-31 MED ORDER — POTASSIUM CHLORIDE CRYS ER 20 MEQ PO TBCR
40.0000 meq | EXTENDED_RELEASE_TABLET | Freq: Once | ORAL | Status: AC
  Administered 2023-03-31: 40 meq via ORAL
  Filled 2023-03-31: qty 2

## 2023-03-31 MED ORDER — MOMETASONE FURO-FORMOTEROL FUM 200-5 MCG/ACT IN AERO
2.0000 | INHALATION_SPRAY | Freq: Two times a day (BID) | RESPIRATORY_TRACT | Status: DC
  Administered 2023-03-31 – 2023-04-06 (×12): 2 via RESPIRATORY_TRACT
  Filled 2023-03-31: qty 8.8

## 2023-03-31 MED ORDER — ACETAMINOPHEN 650 MG RE SUPP: 650.0000 mg | Freq: Four times a day (QID) | RECTAL | Status: DC | PRN

## 2023-03-31 MED ORDER — SODIUM CHLORIDE 0.9 % IV BOLUS
500.0000 mL | Freq: Once | INTRAVENOUS | Status: AC
  Administered 2023-03-31: 500 mL via INTRAVENOUS

## 2023-03-31 MED ORDER — OXYBUTYNIN CHLORIDE 5 MG PO TABS
5.0000 mg | ORAL_TABLET | Freq: Two times a day (BID) | ORAL | Status: DC
  Administered 2023-03-31 – 2023-04-06 (×12): 5 mg via ORAL
  Filled 2023-03-31 (×13): qty 1

## 2023-03-31 MED ORDER — POLYETHYLENE GLYCOL 3350 17 G PO PACK: 17.0000 g | PACK | Freq: Every day | ORAL | Status: DC | PRN

## 2023-03-31 MED ORDER — APIXABAN 5 MG PO TABS
5.0000 mg | ORAL_TABLET | Freq: Two times a day (BID) | ORAL | Status: DC
  Administered 2023-03-31 – 2023-04-06 (×12): 5 mg via ORAL
  Filled 2023-03-31 (×12): qty 1

## 2023-03-31 MED ORDER — VANCOMYCIN HCL IN DEXTROSE 1-5 GM/200ML-% IV SOLN
1000.0000 mg | Freq: Once | INTRAVENOUS | Status: AC
  Administered 2023-03-31: 1000 mg via INTRAVENOUS
  Filled 2023-03-31: qty 200

## 2023-03-31 MED ORDER — TIOTROPIUM BROMIDE MONOHYDRATE 18 MCG IN CAPS
18.0000 ug | ORAL_CAPSULE | Freq: Every day | RESPIRATORY_TRACT | Status: DC
  Administered 2023-04-01 – 2023-04-06 (×6): 18 ug via RESPIRATORY_TRACT
  Filled 2023-03-31: qty 5

## 2023-03-31 MED ORDER — SODIUM CHLORIDE 0.9 % IV SOLN
2.0000 g | Freq: Once | INTRAVENOUS | Status: AC
  Administered 2023-03-31: 2 g via INTRAVENOUS
  Filled 2023-03-31: qty 12.5

## 2023-03-31 MED ORDER — PANTOPRAZOLE SODIUM 40 MG PO TBEC
40.0000 mg | DELAYED_RELEASE_TABLET | Freq: Every day | ORAL | Status: DC
  Administered 2023-03-31 – 2023-04-06 (×7): 40 mg via ORAL
  Filled 2023-03-31 (×7): qty 1

## 2023-03-31 MED ORDER — ONDANSETRON HCL 4 MG/2ML IJ SOLN: 4.0000 mg | Freq: Four times a day (QID) | INTRAMUSCULAR | Status: DC | PRN

## 2023-03-31 MED ORDER — ENOXAPARIN SODIUM 30 MG/0.3ML IJ SOSY: 30.0000 mg | PREFILLED_SYRINGE | INTRAMUSCULAR | Status: DC

## 2023-03-31 MED ORDER — POTASSIUM CHLORIDE 10 MEQ/100ML IV SOLN
10.0000 meq | INTRAVENOUS | Status: AC
  Administered 2023-03-31 (×2): 10 meq via INTRAVENOUS
  Filled 2023-03-31 (×2): qty 100

## 2023-03-31 MED ORDER — SODIUM CHLORIDE 0.9% FLUSH
3.0000 mL | Freq: Two times a day (BID) | INTRAVENOUS | Status: DC
  Administered 2023-04-01 – 2023-04-06 (×11): 3 mL via INTRAVENOUS

## 2023-03-31 MED ORDER — METOPROLOL SUCCINATE ER 50 MG PO TB24
50.0000 mg | ORAL_TABLET | Freq: Every day | ORAL | Status: DC
  Administered 2023-04-01 – 2023-04-05 (×4): 50 mg via ORAL
  Filled 2023-03-31 (×5): qty 1

## 2023-03-31 MED ORDER — MONTELUKAST SODIUM 10 MG PO TABS
10.0000 mg | ORAL_TABLET | Freq: Every day | ORAL | Status: DC
  Administered 2023-04-01 – 2023-04-05 (×5): 10 mg via ORAL
  Filled 2023-03-31 (×5): qty 1

## 2023-03-31 NOTE — H&P (Signed)
History and Physical    Patient: Ann Silva:[id number] DOB: Nov 15, 1945 DOA: 03/31/2023 DOS: the patient was seen and examined on 03/31/2023 PCP: Danella Penton, MD  Patient coming from: Home  Chief Complaint:  Chief Complaint  Patient presents with   Weakness   HPI: Ann Silva is a 77 y.o. female with medical history significant of chronic hypoxic respiratory failure on 4 L at baseline, chronic cor pulmonale, recurrent UTI, COPD, colon cancer s/p colectomy, hypertension, lower extremity edema on Lasix, who presents to the ED due to generalized weakness for 4 to 5 days.  Mrs. Ann Silva states that 4 to 5 days ago, she began to experience right arm cramps and stiffness that began in the hand and moved upwards towards her back and then into the left arm.  With the stiffness, she has been experiencing weakness but seems equal bilaterally.  She feels that her lower extremities are both weak but thought that this was due to increased swelling.  Due to gradually worsening weakness throughout, she has been unable to perform ADLs.  She also endorses abdominal distention but states this has been occurring for the last 6 weeks at least and does not seem to have acutely worsened but just has not improved yet.  She denies any chest pain, palpitations, shortness of breath, cough, nausea, vomiting, diarrhea, abdominal pain.  She denies any urinary symptoms at this time.  Husband at bedside states that since symptoms started, she seems more fatigued than usual as well.  Appetite is unchanged though.  ED course: On arrival to the ED, patient was normotensive at 129/64 with heart rate of 91.  She was saturating at 94% on home 4 L.  She was afebrile at 98.9.  Initial workup demonstrated platelets of 480, sodium 133, potassium 2.6, bicarb 29, BUN 30, creatinine 1.58, calcium 11.1, anion gap 16, with GFR of 33.  Troponin and BNP within normal limits.  Lactic acid elevated at 4.1 with improvement to 1.8.   Urinalysis with trace leukocytes and rare bacteria.  Chest x-ray was obtained with no acute cardiopulmonary disease.  Patient initially given fluids and then IV Lasix.  TRH contacted for admission.  Review of Systems: As mentioned in the history of present illness. All other systems reviewed and are negative.  Past Medical History:  Diagnosis Date   Anemia    takes ferrous sulfate daily   Anxiety    Arthritis    Bladder spasm    Chronic back pain    spinal stenosis   Colon cancer (HCC)    Complication of anesthesia    B/P bottoms out, hard to wake up    Contact dermatitis    COPD (chronic obstructive pulmonary disease) (HCC)    Albuterol inhaler and DuoNeb as needed   COPD (chronic obstructive pulmonary disease) (HCC)    Coronary artery disease    mild by 08/25/09 cath   Foot drop, left    Headache    Heart murmur    never had any problems   History of blood transfusion    no abnormal reaction noted   History of bronchitis    History of MRSA infection 6+ yrs ago   Hypertension    takes Metoprolol daily   Hypertension    Insomnia    takes Trazodone nightly   Joint pain    Joint swelling    Lower extremity edema    Nocturia    Panic attacks    takes Effexor daily  Peripheral edema    takes Lasix daily and takes Bumex every 3 days.Takes Aldactone daily    Pulmonary emboli (HCC) 2018   Restless leg syndrome    takes Requip daily   RLS (restless legs syndrome)    Sleep apnea    doesnt use CPAP   Past Surgical History:  Procedure Laterality Date   ABDOMINAL HYSTERECTOMY     BACK SURGERY     BREAST EXCISIONAL BIOPSY Left 1980's   neg   CATARACT EXTRACTION W/PHACO Right 10/09/2021   Procedure: CATARACT EXTRACTION PHACO AND INTRAOCULAR LENS PLACEMENT (IOC) RIGHT 23.94 01:52.0;  Surgeon: Galen Manila, MD;  Location: [facility];  Service: Ophthalmology;  Laterality: Right;   CATARACT EXTRACTION W/PHACO Left 10/23/2021   Procedure: CATARACT EXTRACTION PHACO  AND INTRAOCULAR LENS PLACEMENT (IOC) LEFT 7.34 00:47.4;  Surgeon: Galen Manila, MD;  Location: [facility];  Service: Ophthalmology;  Laterality: Left;   CHOLECYSTECTOMY     COLON SURGERY     partial colectomy d/t cancer cells   COLONOSCOPY     HIP CLOSED REDUCTION Left 12/11/2020   Procedure: CLOSED REDUCTION HIP;  Surgeon: Kennedy Bucker, MD;  Location: [facility];  Service: Orthopedics;  Laterality: Left;   I&D of left hip      x 4   INTERSTIM IMPLANT PLACEMENT N/A 03/08/2019   Procedure: Leane Platt IMPLANT FIRST AND SECOND STAGE WITH IMPEDENCE CHECK;  Surgeon: Alfredo Martinez, MD;  Location: [facility];  Service: Urology;  Laterality: N/A;   JOINT REPLACEMENT     PULMONARY THROMBECTOMY Right 05/20/2022   Procedure: PULMONARY THROMBECTOMY;  Surgeon: Annice Needy, MD;  Location: [facility];  Service: Cardiovascular;  Laterality: Right;  Bilateral subsegmental   Resection total hip     Revision of total hip arthroplasty     x3-4   right hip replacement     TOTAL HIP ARTHROPLASTY Left    Social History:  reports that she has quit smoking. Her smoking use included cigarettes. She has never used smokeless tobacco. She reports that she does not drink alcohol and does not use drugs.  Allergies  Allergen Reactions   Hydrocodone-Chlorpheniramine Shortness Of Breath   Sulfa Antibiotics Shortness Of Breath   Sulfa Antibiotics Shortness Of Breath   Chlorhexidine Rash   Methadone Other (See Comments)    Altered Mental Status   Benadryl [Diphenhydramine] Anxiety   Latex Rash   Tape Rash    "the white kind they use after surgery"    Family History  Problem Relation Age of Onset   Cancer Mother    Stroke Father    Diabetes Sister    Mental illness Sister     Prior to Admission medications   Medication Sig Start Date End Date Taking? Authorizing Provider  apixaban (ELIQUIS) 5 MG TABS tablet Take 5 mg by mouth 2 (two) times daily.   Yes [provider]   ciprofloxacin (CIPRO) 250 MG tablet Take 250 mg by mouth 2 (two) times daily. 03/28/23 04/04/23 Yes [provider]  fluticasone-salmeterol (ADVAIR) 250-50 MCG/ACT AEPB Inhale 1 puff into the lungs in the morning and at bedtime. 03/05/23 03/04/24 Yes [provider]  metolazone (ZAROXOLYN) 2.5 MG tablet Take 2.5 mg by mouth daily. 03/28/23 03/27/24 Yes [provider]  tiotropium (SPIRIVA) 18 MCG inhalation capsule Place 18 mcg into inhaler and inhale daily. 03/05/23 03/04/24 Yes [provider]  acetaminophen (TYLENOL) 500 MG tablet Take 1,000 mg by mouth every 6 (six) hours as needed for  moderate pain or headache.    [provider]  albuterol (PROVENTIL HFA;VENTOLIN HFA) 108 (90 Base) MCG/ACT inhaler Inhale 2 puffs into the lungs every 4 (four) hours as needed for wheezing or shortness of breath. 12/05/15   Alford Highland, MD  ARIPiprazole (ABILIFY) 5 MG tablet Take 5 mg by mouth at bedtime.    [provider]  buPROPion (WELLBUTRIN) 75 MG tablet Take 75 mg by mouth 2 (two) times daily. Unsure of dose    [provider]  furosemide (LASIX) 40 MG tablet Take 1 tablet (40 mg total) by mouth daily. 02/26/23 04/27/23  Tresa Moore, MD  metoprolol succinate (TOPROL-XL) 50 MG 24 hr tablet Take 50 mg by mouth daily. 05/08/22   [provider]  montelukast (SINGULAIR) 10 MG tablet Take 10 mg by mouth daily. 11/16/20   [provider]  omeprazole (PRILOSEC) 40 MG capsule Take 40 mg by mouth daily. 04/02/22   [provider]  oxybutynin (DITROPAN) 5 MG tablet Take 5 mg by mouth 2 (two) times daily. 03/29/22   [provider]  PARoxetine (PAXIL) 40 MG tablet Take 40 mg by mouth daily at 2 PM.     [provider]  potassium chloride 20 MEQ TBCR Take 1 tablet (20 mEq total) by mouth daily. 02/26/23 04/27/23  Tresa Moore, MD  rOPINIRole (REQUIP) 3 MG tablet Take 1 tablet (3 mg total) by mouth 4 (four)  times daily. 04/20/15   Steele Sizer, MD  spironolactone (ALDACTONE) 25 MG tablet Take 25 mg by mouth daily. 04/18/22   [provider]  temazepam (RESTORIL) 30 MG capsule Take 30 mg by mouth at bedtime as needed. 05/17/22   [provider]    Physical Exam: Vitals:   03/31/23 1230 03/31/23 1300 03/31/23 1700 03/31/23 1702  BP: (!) 147/68 117/64 111/71   Pulse: 88 83 78   Resp: 18 20 (!) 22   Temp:      TempSrc:      SpO2: 98% 98% 100%   Weight:    93 kg  Height:    4\' 10"  (1.473 m)   Physical Exam Vitals and nursing note reviewed.  Constitutional:      General: She is not in acute distress.    Appearance: She is obese.  HENT:     Head: Normocephalic and atraumatic.     Mouth/Throat:     Mouth: Mucous membranes are moist.     Pharynx: Oropharynx is clear.  Eyes:     Conjunctiva/sclera: Conjunctivae normal.     Pupils: Pupils are equal, round, and reactive to light.  Neck:     Vascular: No JVD.  Cardiovascular:     Rate and Rhythm: Normal rate and regular rhythm.     Heart sounds: Murmur (2/6 holosystolic murmur) heard.  Pulmonary:     Effort: Pulmonary effort is normal. No respiratory distress.     Breath sounds: Normal breath sounds. No wheezing, rhonchi or rales.  Abdominal:     General: Bowel sounds are normal. There is distension (distention but likely due to body habitus).     Palpations: Abdomen is soft.     Tenderness: There is no abdominal tenderness. There is no guarding.  Musculoskeletal:     Comments: Bilateral lower extremity swelling that is non-pitting with evidence of chronic venous changes as well  Skin:    General: Skin is warm and dry.  Neurological:     Mental Status: She is alert and  oriented to person, place, and time.     Comments: 3/5 strength throughout  Psychiatric:        Mood and Affect: Mood normal.        Behavior: Behavior normal.    Data Reviewed: CBC with WBC of 9.0, hemoglobin of 12.9, MCV of 92, platelets  of 480 CMP with sodium of 133, potassium 2.6, chloride 88, bicarb 29, glucose 168, BUN 30, creatinine 0.58, calcium 11.1, anion gap 16, AST 34, ALT 20 and GFR 33 BNP 42 Troponin negative x 2 Lactic acid 4.2 with decreased to 1.8 Urinalysis with trace leukocytes and rare bacteria but otherwise no abnormalities  EKG personally reviewed.  Sinus rhythm with rate of 90.  Right bundle branch block noted.  Compared to EKG obtained 1 month prior, no changes noted.  DG Chest Portable 1 View  Result Date: 03/31/2023 CLINICAL DATA:  weakness EXAM: PORTABLE CHEST - 1 VIEW COMPARISON:  02/22/2023 FINDINGS: Cardiomediastinal silhouette and pulmonary vasculature are within normal limits. Lungs are clear. Severe degenerative changes of the shoulders. IMPRESSION: No acute cardiopulmonary process. Electronically Signed   By: Acquanetta Belling M.D.   On: 03/31/2023 09:37    Results are pending, will review when available.  Assessment and Plan:  * Generalized weakness Patient presenting with 5 days of generalized weakness that began in the upper extremities as feeling stiff/crampy and in the lower extremities 2/2 to subjective edema.  In addition, husband's concern she is more fatigued/lethargic.  During this time, she has been receiving Lasix, plus new Ciprofloxacin and Metolazone. Weakness may be 2/2 AKI and associated electrolyte derangements, as she does not appear hypervolemic on exam. Will also rule out other reversible causes.  Given bilateral nature,  very low suspicion for CVA  - Gentle IV fluids - Hold further diuretics at this time - Follow-up VBG, TSH, CK, vitamin B12 - PT/OT  AKI (acute kidney injury) (HCC) Likely secondary to recently initiated ciprofloxacin and metolazone in addition to Lasix.  - Hold home nephrotoxic agents - Repeat BMP in the a.m. - Gentle IV fluids  Hypokalemia In the setting of Lasix with metolazone  - Pharmacy consulted for electrolyte management  CHF (congestive  heart failure) (HCC) Per chart review, patient has a history of HFrEF, however every echocardiogram dating back to 2011 has been normal.  Overall, consistent with HFpEF.  There was concern for acute exacerbation, however BNP is within normal limits and patient does not appear hypervolemic with the exception of her lower extremity edema, which is more consistent with lymphedema and venous congestion.  - Hold further diuretics for now - Daily weights - Strict in and out  Chronic hypoxic respiratory failure (HCC) - Continue home 4 L of supplemental oxygen - VBG pending given high risk for hypercarbia in the setting of over oxygenation  COPD (chronic obstructive pulmonary disease) (HCC) Patient denies any pulmonary symptoms at this time.  - Continue home bronchodilators  Chronic cor pulmonale (HCC) Per chart review, patient has documented chronic cor pulmonale for several years now, however there is no documented right heart cath in the chart and there has been no evidence of PASP elevation on any echocardiogram.  Unclear from where this diagnosis arose.  Hypercalcemia In the setting of primary hyperparathyroidism.  Increased compared to baseline but relatively stable mild.  Obstructive sleep apnea Per chart review, patient is noncompliant with CPAP.  Advance Care Planning:   Code Status: Full Code verified by patient  Consults: None  Family Communication: Patient's husband  updated at bedside.   Severity of Illness: The appropriate patient status for this patient is OBSERVATION. Observation status is judged to be reasonable and necessary in order to provide the required intensity of service to ensure the patient's safety. The patient's presenting symptoms, physical exam findings, and initial radiographic and laboratory data in the context of their medical condition is felt to place them at decreased risk for further clinical deterioration. Furthermore, it is anticipated that the patient  will be medically stable for discharge from the hospital within 2 midnights of admission.   Author: Verdene Lennert, MD 03/31/2023 6:17 PM  For on call review [email or web address].

## 2023-03-31 NOTE — Assessment & Plan Note (Signed)
Per chart review, patient is noncompliant with CPAP.

## 2023-03-31 NOTE — Assessment & Plan Note (Signed)
In the setting of Lasix with metolazone.  Magnesium normal  - Pharmacy consulted for electrolyte management

## 2023-03-31 NOTE — Assessment & Plan Note (Addendum)
In the setting of primary hyperparathyroidism.  Increased compared to baseline but relatively stable mild. Improved with IV fluid

## 2023-03-31 NOTE — Assessment & Plan Note (Signed)
Patient denies any pulmonary symptoms at this time.  - Continue home bronchodilators

## 2023-03-31 NOTE — ED Notes (Signed)
Patient had bed change, pericare performed.

## 2023-03-31 NOTE — Progress Notes (Signed)
PHARMACY CONSULT NOTE - ELECTROLYTES  Pharmacy Consult for Electrolyte Monitoring and Replacement   Recent Labs:   CrCl cannot be calculated (Unknown ideal weight.). Potassium (mmol/L)  Date Value  03/31/2023 2.6 (LL)  07/09/2014 3.4 (L)   Magnesium (mg/dL)  Date Value  32/95/1884 2.3   Calcium (mg/dL)  Date Value  16/60/6301 11.1 (H)   Calcium, Total (mg/dL)  Date Value  60/04/9322 10.5 (H)   Albumin (g/dL)  Date Value  55/73/2202 3.4 (L)  10/14/2014 3.9   Phosphorus (mg/dL)  Date Value  54/27/0623 2.0 (L)   Sodium (mmol/L)  Date Value  03/31/2023 133 (L)  01/10/2016 138  07/09/2014 139   Corrected Ca: 11.6 mg/dL  Assessment  Ann Silva is a 77 y.o. female presenting with generalized weakness. PMH significant for chronic hypoxic respiratory failure on 4 L at baseline, chronic cor pulmonale, recurrent UTI, COPD, colon cancer s/p colectomy, hypertension, lower extremity edema on Lasix . Pharmacy has been consulted to monitor and replace electrolytes.  Potassium level low at 2.6 Magnesium level WNL No recent Phos level  Goal of Therapy: Electrolytes WNL  Plan:  Patient already given KCl 40 mEq PO x 1  Will give an additional KCl 10 mEq IV x 4 Will check repeat potassium level 2-4 hours after last dose Check BMP and Phos with AM labs  Thank you for allowing pharmacy to be a part of this patient's care.  Merryl Hacker, PharmD Clinical Pharmacist 03/31/2023 4:32 PM

## 2023-03-31 NOTE — Assessment & Plan Note (Signed)
-   Continue home 4 L of supplemental oxygen - VBG pending given high risk for hypercarbia in the setting of over oxygenation

## 2023-03-31 NOTE — Assessment & Plan Note (Signed)
Per chart review, patient has documented chronic cor pulmonale for several years now, however there is no documented right heart cath in the chart and there has been no evidence of PASP elevation on any echocardiogram.  Unclear from where this diagnosis arose.

## 2023-03-31 NOTE — Assessment & Plan Note (Signed)
Per chart review, patient has a history of HFrEF, however every echocardiogram dating back to 2011 has been normal.  Overall, consistent with HFpEF.  There was concern for acute exacerbation, however BNP is within normal limits and patient does not appear hypervolemic with the exception of her lower extremity edema, which is more consistent with lymphedema and venous congestion.  - Hold further diuretics for now - Daily weights - Strict in and out

## 2023-03-31 NOTE — Assessment & Plan Note (Addendum)
Patient presenting with 5 days of generalized weakness that began in the upper extremities as feeling stiff/crampy and in the lower extremities 2/2 to subjective edema.  In addition, husband's concern she is more fatigued/lethargic.  During this time, she has been receiving Lasix, plus new Ciprofloxacin and Metolazone. Weakness may be 2/2 AKI and associated electrolyte derangements, as she does not appear hypervolemic on exam. Will also rule out other reversible causes.  Given bilateral nature,  very low suspicion for CVA  - Gentle IV fluids - Hold further diuretics at this time - PT/OT-recommending SNF

## 2023-03-31 NOTE — ED Triage Notes (Signed)
Pt to ED via EMS from home with generalized weakness for 4-5 days. Pt reports she was able to walk and perform ADLs independently prior to these changes and is now unable to walk. Pt on 4 L [place] at baseline and A&O x4.   Pt had a recent UTI

## 2023-03-31 NOTE — Assessment & Plan Note (Signed)
Likely secondary to recently initiated ciprofloxacin and metolazone in addition to Lasix. Resolved. - Hold home nephrotoxic agents - Repeat BMP in the a.m. - Gentle IV fluids

## 2023-03-31 NOTE — ED Provider Notes (Signed)
[facility] Provider Note    Event Date/Time   First MD Initiated Contact with Patient 03/31/23 0745     (approximate)   History   Weakness   HPI  Ann Silva is a 77 y.o. female who presents to the emergency department today because of concerns for weakness.  Patient states over the past 5 weeks she has been dealing with recurrent UTIs.  She is currently on a course of Cipro.  Over the past few days she is gotten increasingly weak to the point where it is hard for her to perform her ADLs.  She has also noticed some swelling in her lower legs.  She states that she had an admission to the hospital roughly 5 weeks ago for swelling and required IV diuretics.  She has been taking her oral diuretics but has noticed a decrease in urination.  She denies any pain in her legs.  She denies any worsening shortness of breath over her baseline.  She is on 4 L chronically.     Physical Exam   Triage Vital Signs: ED Triage Vitals [03/31/23 0748]  Encounter Vitals Group     BP      Systolic BP Percentile      Diastolic BP Percentile      Pulse      Resp      Temp      Temp src      SpO2      Weight      Height      Head Circumference      Peak Flow      Pain Score 6     Pain Loc      Pain Education      Exclude from Growth Chart     Most recent vital signs: Vitals:   03/31/23 0754  BP: 129/64  Pulse: 91  Resp: (!) 22  Temp: 98.9 F (37.2 C)  SpO2: 94%   General: Awake, alert, oriented. CV:  Good peripheral perfusion. Regular rate and rhythm. Resp:  Normal effort. Slight expiratory wheezing anteriorly Abd:  No distention.  Other:  Bilateral lower extremity edema.    ED Results / Procedures / Treatments   Labs (all labs ordered are listed, but only abnormal results are displayed) Labs Reviewed  CULTURE, BLOOD (ROUTINE X 2)  CULTURE, BLOOD (ROUTINE X 2)  CBC WITH DIFFERENTIAL/PLATELET  COMPREHENSIVE METABOLIC PANEL  LACTIC ACID, PLASMA   LACTIC ACID, PLASMA  TROPONIN I (HIGH SENSITIVITY)     EKG  I, Phineas Semen, attending physician, personally viewed and interpreted this EKG  EKG Time: 0751 Rate: 90 Rhythm: sinus rhythm Axis: left axis deviation Intervals: qtc 530 QRS: RBBB ST changes: no st elevation Impression: abnormal ekg   RADIOLOGY *** {USE THE WORD "INTERPRETED"!! You MUST document your own interpretation of imaging, as well as the fact that you reviewed the radiologist's report!:1}   PROCEDURES:  Critical Care performed: Yes  CRITICAL CARE Performed by: Phineas Semen   Total critical care time: *** minutes  Critical care time was exclusive of separately billable procedures and treating other patients.  Critical care was necessary to treat or prevent imminent or life-threatening deterioration.  Critical care was time spent personally by me on the following activities: development of treatment plan with patient and/or surrogate as well as nursing, discussions with consultants, evaluation of patient's response to treatment, examination of patient, obtaining history from patient or surrogate, ordering and performing treatments and interventions,  ordering and review of laboratory studies, ordering and review of radiographic studies, pulse oximetry and re-evaluation of patient's condition.   Procedures    MEDICATIONS ORDERED IN ED: Medications - No data to display   IMPRESSION / MDM / ASSESSMENT AND PLAN / ED COURSE  I reviewed the triage vital signs and the nursing notes.                              Differential diagnosis includes, but is not limited to, infection, anemia, CHF, dehydration  Patient's presentation is most consistent with acute presentation with potential threat to life or bodily function.   The patient is on the cardiac monitor to evaluate for evidence of arrhythmia and/or significant heart rate changes.  Patient presents to the emergency department today because  of concerns for weakness as well as some leg swelling.  On exam patient does have some slight expiratory wheezing and bilateral lower extremity edema.  Patient is afebrile.  Broad workup was initiated.  ***      FINAL CLINICAL IMPRESSION(S) / ED DIAGNOSES   Final diagnoses:  None     Rx / DC Orders   ED Discharge Orders     None        Note:  This document was prepared using Dragon voice recognition software and may include unintentional dictation errors.

## 2023-04-01 ENCOUNTER — Other Ambulatory Visit: Payer: Self-pay

## 2023-04-01 DIAGNOSIS — E210 Primary hyperparathyroidism: Secondary | ICD-10-CM | POA: Diagnosis present

## 2023-04-01 DIAGNOSIS — M10071 Idiopathic gout, right ankle and foot: Secondary | ICD-10-CM | POA: Diagnosis not present

## 2023-04-01 DIAGNOSIS — G4700 Insomnia, unspecified: Secondary | ICD-10-CM | POA: Diagnosis present

## 2023-04-01 DIAGNOSIS — I2510 Atherosclerotic heart disease of native coronary artery without angina pectoris: Secondary | ICD-10-CM | POA: Diagnosis present

## 2023-04-01 DIAGNOSIS — J9611 Chronic respiratory failure with hypoxia: Secondary | ICD-10-CM | POA: Diagnosis present

## 2023-04-01 DIAGNOSIS — Z1152 Encounter for screening for COVID-19: Secondary | ICD-10-CM | POA: Diagnosis not present

## 2023-04-01 DIAGNOSIS — G4733 Obstructive sleep apnea (adult) (pediatric): Secondary | ICD-10-CM | POA: Diagnosis present

## 2023-04-01 DIAGNOSIS — M549 Dorsalgia, unspecified: Secondary | ICD-10-CM | POA: Diagnosis present

## 2023-04-01 DIAGNOSIS — E876 Hypokalemia: Secondary | ICD-10-CM | POA: Diagnosis present

## 2023-04-01 DIAGNOSIS — I4510 Unspecified right bundle-branch block: Secondary | ICD-10-CM | POA: Diagnosis present

## 2023-04-01 DIAGNOSIS — F410 Panic disorder [episodic paroxysmal anxiety] without agoraphobia: Secondary | ICD-10-CM | POA: Diagnosis present

## 2023-04-01 DIAGNOSIS — R600 Localized edema: Secondary | ICD-10-CM | POA: Diagnosis not present

## 2023-04-01 DIAGNOSIS — Z6841 Body Mass Index (BMI) 40.0 and over, adult: Secondary | ICD-10-CM | POA: Diagnosis not present

## 2023-04-01 DIAGNOSIS — M21372 Foot drop, left foot: Secondary | ICD-10-CM | POA: Diagnosis present

## 2023-04-01 DIAGNOSIS — N390 Urinary tract infection, site not specified: Secondary | ICD-10-CM | POA: Diagnosis present

## 2023-04-01 DIAGNOSIS — I890 Lymphedema, not elsewhere classified: Secondary | ICD-10-CM | POA: Diagnosis present

## 2023-04-01 DIAGNOSIS — R531 Weakness: Secondary | ICD-10-CM | POA: Diagnosis present

## 2023-04-01 DIAGNOSIS — G2581 Restless legs syndrome: Secondary | ICD-10-CM | POA: Diagnosis present

## 2023-04-01 DIAGNOSIS — E872 Acidosis, unspecified: Secondary | ICD-10-CM | POA: Diagnosis present

## 2023-04-01 DIAGNOSIS — J449 Chronic obstructive pulmonary disease, unspecified: Secondary | ICD-10-CM | POA: Diagnosis present

## 2023-04-01 DIAGNOSIS — M109 Gout, unspecified: Secondary | ICD-10-CM | POA: Diagnosis present

## 2023-04-01 DIAGNOSIS — G8929 Other chronic pain: Secondary | ICD-10-CM | POA: Diagnosis present

## 2023-04-01 DIAGNOSIS — I5032 Chronic diastolic (congestive) heart failure: Secondary | ICD-10-CM | POA: Diagnosis present

## 2023-04-01 DIAGNOSIS — I2781 Cor pulmonale (chronic): Secondary | ICD-10-CM | POA: Diagnosis present

## 2023-04-01 DIAGNOSIS — N179 Acute kidney failure, unspecified: Secondary | ICD-10-CM | POA: Diagnosis present

## 2023-04-01 DIAGNOSIS — I110 Hypertensive heart disease with heart failure: Secondary | ICD-10-CM | POA: Diagnosis present

## 2023-04-01 LAB — BASIC METABOLIC PANEL
Anion gap: 14 (ref 5–15)
BUN: 21 mg/dL (ref 8–23)
CO2: 30 mmol/L (ref 22–32)
Calcium: 10.3 mg/dL (ref 8.9–10.3)
Chloride: 91 mmol/L — ABNORMAL LOW (ref 98–111)
Creatinine, Ser: 0.95 mg/dL (ref 0.44–1.00)
GFR, Estimated: >60 mL/min (ref >60)
Glucose, Bld: 110 mg/dL — ABNORMAL HIGH (ref 70–99)
Potassium: 2.6 mmol/L — CL (ref 3.5–5.1)
Sodium: 135 mmol/L (ref 135–145)

## 2023-04-01 LAB — CBC
HCT: 39.5 % (ref 36.0–46.0)
Hemoglobin: 12.5 g/dL (ref 12.0–15.0)
MCH: 29.8 pg (ref 26.0–34.0)
MCHC: 31.6 g/dL (ref 30.0–36.0)
MCV: 94.3 fL (ref 80.0–100.0)
Platelets: 454 10*3/uL — ABNORMAL HIGH (ref 150–400)
RBC: 4.19 MIL/uL (ref 3.87–5.11)
RDW: 15.5 % (ref 11.5–15.5)
WBC: 7.2 10*3/uL (ref 4.0–10.5)
nRBC: 0 % (ref 0.0–0.2)

## 2023-04-01 LAB — MAGNESIUM: Magnesium: 2.5 mg/dL — ABNORMAL HIGH (ref 1.7–2.4)

## 2023-04-01 LAB — POTASSIUM: Potassium: 3.6 mmol/L (ref 3.5–5.1)

## 2023-04-01 LAB — VITAMIN B12: Vitamin B-12: 920 pg/mL — ABNORMAL HIGH (ref 180–914)

## 2023-04-01 LAB — PHOSPHORUS: Phosphorus: 2.7 mg/dL (ref 2.5–4.6)

## 2023-04-01 MED ORDER — POTASSIUM CHLORIDE 10 MEQ/100ML IV SOLN
10.0000 meq | INTRAVENOUS | Status: AC
  Administered 2023-04-01 (×4): 10 meq via INTRAVENOUS
  Filled 2023-04-01 (×4): qty 100

## 2023-04-01 MED ORDER — PHENOL 1.4 % MT LIQD
1.0000 | OROMUCOSAL | Status: DC | PRN
  Administered 2023-04-01: 1 via OROMUCOSAL
  Filled 2023-04-01: qty 177

## 2023-04-01 MED ORDER — DOCUSATE SODIUM 100 MG PO CAPS
100.0000 mg | ORAL_CAPSULE | Freq: Every day | ORAL | Status: DC
  Administered 2023-04-01 – 2023-04-05 (×5): 100 mg via ORAL
  Filled 2023-04-01 (×5): qty 1

## 2023-04-01 MED ORDER — POTASSIUM CHLORIDE 20 MEQ PO PACK
40.0000 meq | PACK | Freq: Once | ORAL | Status: AC
  Administered 2023-04-01: 40 meq via ORAL
  Filled 2023-04-01: qty 2

## 2023-04-01 NOTE — Hospital Course (Addendum)
Taken from H&P.  Ann Silva is a 77 y.o. female with medical history significant of chronic hypoxic respiratory failure on 4 L at baseline, chronic cor pulmonale, recurrent UTI, COPD, colon cancer s/p colectomy, hypertension, lower extremity edema on Lasix, who presents to the ED due to generalized weakness for 4 to 5 days.   Due to gradually worsening weakness throughout, she has been unable to perform ADLs. She also endorses abdominal distention but states this has been occurring for the last 6 weeks at least and does not seem to have acutely worsened but just has not improved yet. She denies any chest pain, palpitations, shortness of breath, cough, nausea, vomiting, diarrhea, abdominal pain. She denies any urinary symptoms at this time.   On presenting to ED she had stable vitals, saturating well on baseline oxygen requirement of 4 L.  Labs pertinent for sodium 133, potassium 2.6, bicarb 29, BUN 30, creatinine 1.58, calcium 11.1.  Troponin and BNP was normal.  Elevated lactic acid at 4.1 which improved to 1.8 with IV fluid.  UA with trace leukocytes and rare bacteria.  Chest x-ray with no acute cardiopulmonary disease.  Patient did receive IV Lasix in ED which was not continued as she appears euvolemic and needing some IV fluid.  9/24: Patient with persistent hypokalemia with normal magnesium, potassium is being repleted. Preliminary blood cultures negative, TSH and CK normal, B12 920.  AKI resolved.  Calcium normalized. PT and OT are recommending SNF. 9/25.  Patient complains of swelling of the lower extremities and difficulty walking secondary to swelling.  Patient interested in going back on her water pills.  Patient interested in going home and not to rehab.  No pain behind her right knee today. 9/26.  Continue IV Lasix.  Started colchicine and indomethacin for gout 9/27.  Continue IV Lasix.  Continue colchicine twice daily and increased dose of indomethacin for gout 9/28.  Creatinine up  to 1.27 this morning.  Hold Lasix, spironolactone, potassium and discontinue indomethacin.  Ankle pain is better. 9/29.  Creatinine improved down to 1.02.  Will start torsemide instead of Lasix, restart spironolactone.

## 2023-04-01 NOTE — Progress Notes (Signed)
0532-On call notified of critical potassium 2.6 called by lab 0528.

## 2023-04-01 NOTE — Progress Notes (Addendum)
Occupational Therapy Evaluation Patient Details Name: Ann Silva MRN: [id number] DOB: 1946-01-05 Today's Date: 04/01/2023   History of Present Illness Patient is a 77 year old female with complaints of leg swelling, chest pain, shortness of breath.   Clinical Impression   Pt seen for OT eval this date. MD cleared for tx via secure chat (K+ waiting updated lab results). Pt received in bed with daughter in law and friend present. PTA, pt using rollator for mobility and was MOD I for ADLs. She lives in a 1 story home with 2 STE, and her husband + sister live in same house (avail PRN for assist).  Uses 4L O2 at home. Denies recent falls but has been feeling weaker in the last few days (mentions hx of failed hip surgeries but no recent injuries to LE). She has concerns regarding recent functional decline.   Currently, pt requires MAX A to perform bed mobility with several rest breaks transitioning to EOB. On 4L via , SpO2% 95. At rest, pain 0/10. Reporting 9/10 behind R knee pain with any mobility (tingling, stabbing), R elbow pain (tingling, stabbing) with palpitation. Pt denies changes in vision or sensation. No focal deficits observed - pt WFL for tasks assessed (grossly 4-/5 BUE). Standing or transfers deferred at this time due to increase pain (however, pt reports using BSC earlier today with nursing and pain not present). Pt tolerated sitting EOB ~5 mins before returning to supine with MAX A, and TOTAL A +2 to boost up towards HOB. Anticipate pt will require MIN A for UB ADLs, MAX A for LB ADLs, and potentially +2 for safe transfer to recliner/BSC with current pain levels. RN present during OT eval to administer medications. Updated care team via secure chat. Pt would benefit from skilled OT services to address noted impairments and functional limitations (see below for any additional details) in order to maximize safety and independence while minimizing falls risk and caregiver burden. Anticipate  the need for follow up OT services upon acute hospital DC.       If plan is discharge home, recommend the following: Two people to help with walking and/or transfers;A lot of help with bathing/dressing/bathroom;Assistance with cooking/housework;Direct supervision/assist for medications management;Direct supervision/assist for financial management;Assist for transportation;Help with stairs or ramp for entrance    Functional Status Assessment  Patient has had a recent decline in their functional status and demonstrates the ability to make significant improvements in function in a reasonable and predictable amount of time.  Equipment Recommendations  Other (comment) (defer to next LOC)    Recommendations for Other Services Other (comment)     Precautions / Restrictions Precautions Precautions: Fall Restrictions Weight Bearing Restrictions: No      Mobility Bed Mobility Overal bed mobility: Needs Assistance Bed Mobility: Supine to Sit, Sit to Supine     Supine to sit: Max assist, HOB elevated, Used rails Sit to supine: Used rails, HOB elevated, Max assist   General bed mobility comments: Increased time/effort, use of chuck pad to come fully to EOB, requires TOTAL A +2 to boost up in bed    Transfers Overall transfer level: Needs assistance                 General transfer comment: NT. 9/10 pain in RLE sitting EOB.      Balance Overall balance assessment: Needs assistance Sitting-balance support: Bilateral upper extremity supported Sitting balance-Leahy Scale: Fair Sitting balance - Comments: Posterior lean, occ MIN A to correct sitting EOB Postural  control: Posterior lean                                 ADL either performed or assessed with clinical judgement   ADL Overall ADL's : Needs assistance/impaired                                       General ADL Comments: Assessment limited due to pt's complaints of pain in R knee.  Anticipate pt will require MIN A for UB ADLs, MAX A for LB ADLs, and potentially +2 for safe transfer to recliner/BSC.      Pertinent Vitals/Pain Pain Assessment Pain Assessment: 0-10 Pain Score: 9  Pain Location: Behind R knee Pain Descriptors / Indicators: Jabbing, Tingling, Stabbing, Grimacing, Guarding Pain Intervention(s): Limited activity within patient's tolerance, RN gave pain meds during session, Monitored during session     Extremity/Trunk Assessment Upper Extremity Assessment Upper Extremity Assessment: RUE deficits/detail;Generalized weakness (Pain upon palpitation of R elbow (tingling, jabbing))   Lower Extremity Assessment Lower Extremity Assessment: Generalized weakness;RLE deficits/detail RLE Deficits / Details: Pain directly behind R knee with movement (jabbing, tingling)       Communication Communication Communication: No apparent difficulties   Cognition Arousal: Alert Behavior During Therapy: WFL for tasks assessed/performed Overall Cognitive Status: Within Functional Limits for tasks assessed                                       General Comments  BLE red and swollen            Home Living Family/patient expects to be discharged to:: Private residence Living Arrangements: Spouse/significant other Available Help at Discharge: Family;Available 24 hours/day Type of Home: House Home Access: Stairs to enter [employer] of Steps: 2 Entrance Stairs-Rails: Right;Left Home Layout: One level     Bathroom Shower/Tub: Producer, television/film/video: Standard     Home Equipment: Rollator (4 wheels);Cane - single point;BSC/3in1;Shower seat;Grab bars - toilet;Grab bars - tub/shower;Other (comment) (lift bed)          Prior Functioning/Environment Prior Level of Function : Independent/Modified Independent             Mobility Comments: using rollator ADLs Comments: Does not drive, IND in BADLs. Husband does cooking,  laundry, can provide some level of physical assist        OT Problem List: Decreased strength;Decreased range of motion;Decreased activity tolerance;Impaired balance (sitting and/or standing);Increased edema;Pain      OT Treatment/Interventions: Self-care/ADL training;Therapeutic exercise;Energy conservation;DME and/or AE instruction;Therapeutic activities;Patient/family education;Balance training    OT Goals(Current goals can be found in the care plan section) Acute Rehab OT Goals Patient Stated Goal: to go home OT Goal Formulation: With patient Time For Goal Achievement: 04/15/23 Potential to Achieve Goals: Fair  OT Frequency: Min 1X/week       AM-PAC OT "6 Clicks" Daily Activity     Outcome Measure Help from another person eating meals?: None Help from another person taking care of personal grooming?: A Little Help from another person toileting, which includes using toliet, bedpan, or urinal?: A Lot Help from another person bathing (including washing, rinsing, drying)?: A Lot Help from another person to put on and taking off regular upper body clothing?: A Lot Help  from another person to put on and taking off regular lower body clothing?: A Lot 6 Click Score: 15   End of Session Equipment Utilized During Treatment: Oxygen (on 4L via [place], 95%) Nurse Communication: Patient requests pain meds;Mobility status  Activity Tolerance: Patient limited by pain Patient left: in bed;with call bell/phone within reach;with bed alarm set;with nursing/sitter in room  OT Visit Diagnosis: Muscle weakness (generalized) (M62.81);Unsteadiness on feet (R26.81);Other abnormalities of gait and mobility (R26.89)                Time: 5188-4166 OT Time Calculation (min): 42 min Charges:  OT General Charges $OT Visit: 1 Visit OT Evaluation $OT Eval Low Complexity: 1 Low OT Treatments $Self Care/Home Management : 23-37 mins  Kolbee Bogusz L. Fredonia Casalino, OTR/L  04/01/23, 4:38 PM

## 2023-04-01 NOTE — Progress Notes (Addendum)
PHARMACY CONSULT NOTE - ELECTROLYTES  Pharmacy Consult for Electrolyte Monitoring and Replacement   Recent Labs: Height: 4\' 10"  (147.3 cm) Weight: 93 kg (205 lb) IBW/kg (Calculated) : 40.9 Estimated Creatinine Clearance: 47.5 mL/min (by C-G formula based on SCr of 0.95 mg/dL). Potassium (mmol/L)  Date Value  04/01/2023 3.6  07/09/2014 3.4 (L)   Magnesium (mg/dL)  Date Value  82/95/6213 2.5 (H)   Calcium (mg/dL)  Date Value  08/65/7846 10.3   Calcium, Total (mg/dL)  Date Value  96/29/5284 10.5 (H)   Albumin (g/dL)  Date Value  13/24/4010 3.4 (L)  10/14/2014 3.9   Phosphorus (mg/dL)  Date Value  27/25/3664 2.7   Sodium (mmol/L)  Date Value  04/01/2023 135  01/10/2016 138  07/09/2014 139   Corrected Ca: 11.6 mg/dL  Assessment  Ann Silva is a 77 y.o. female presenting with generalized weakness. PMH significant for chronic hypoxic respiratory failure on 4 L at baseline, chronic cor pulmonale, recurrent UTI, COPD, colon cancer s/p colectomy, hypertension, lower extremity edema on Lasix . Pharmacy has been consulted to monitor and replace electrolytes.  Goal of Therapy: Electrolytes WNL  Plan:  Potassium WNL No additional supplementation needed Check BMP and Mag with AM labs  Thank you for involving pharmacy in this patient's care.   Paulita Fujita, PharmD Clinical Pharmacist 04/01/2023 3:37 PM

## 2023-04-01 NOTE — Progress Notes (Signed)
Progress Note   Patient: Ann Silva UEA:[id number] DOB: Dec 18, 1945 DOA: 03/31/2023     0 DOS: the patient was seen and examined on 04/01/2023   Brief hospital course: Taken from H&P.  Ann Silva is a 77 y.o. female with medical history significant of chronic hypoxic respiratory failure on 4 L at baseline, chronic cor pulmonale, recurrent UTI, COPD, colon cancer s/p colectomy, hypertension, lower extremity edema on Lasix, who presents to the ED due to generalized weakness for 4 to 5 days.   Due to gradually worsening weakness throughout, she has been unable to perform ADLs. She also endorses abdominal distention but states this has been occurring for the last 6 weeks at least and does not seem to have acutely worsened but just has not improved yet. She denies any chest pain, palpitations, shortness of breath, cough, nausea, vomiting, diarrhea, abdominal pain. She denies any urinary symptoms at this time.   On presenting to ED she had stable vitals, saturating well on baseline oxygen requirement of 4 L.  Labs pertinent for sodium 133, potassium 2.6, bicarb 29, BUN 30, creatinine 1.58, calcium 11.1.  Troponin and BNP was normal.  Elevated lactic acid at 4.1 which improved to 1.8 with IV fluid.  UA with trace leukocytes and rare bacteria.  Chest x-ray with no acute cardiopulmonary disease.  Patient did receive IV Lasix in ED which was not continued as she appears euvolemic and needing some IV fluid.  9/24: Patient with persistent hypokalemia with normal magnesium, potassium is being repleted. Preliminary blood cultures negative, TSH and CK normal, B12 920.  AKI resolved.  Calcium normalized. PT and OT are recommending SNF.    Assessment and Plan: * Generalized weakness Patient presenting with 5 days of generalized weakness that began in the upper extremities as feeling stiff/crampy and in the lower extremities 2/2 to subjective edema.  In addition, husband's concern she is more  fatigued/lethargic.  During this time, she has been receiving Lasix, plus new Ciprofloxacin and Metolazone. Weakness may be 2/2 AKI and associated electrolyte derangements, as she does not appear hypervolemic on exam. Will also rule out other reversible causes.  Given bilateral nature,  very low suspicion for CVA  - Gentle IV fluids - Hold further diuretics at this time - PT/OT-recommending SNF  AKI (acute kidney injury) (HCC) Likely secondary to recently initiated ciprofloxacin and metolazone in addition to Lasix. Resolved. - Hold home nephrotoxic agents - Repeat BMP in the a.m. - Gentle IV fluids  Hypokalemia In the setting of Lasix with metolazone.  Magnesium normal  - Pharmacy consulted for electrolyte management  CHF (congestive heart failure) (HCC) Per chart review, patient has a history of HFrEF, however every echocardiogram dating back to 2011 has been normal.  Overall, consistent with HFpEF.  There was concern for acute exacerbation, however BNP is within normal limits and patient does not appear hypervolemic with the exception of her lower extremity edema, which is more consistent with lymphedema and venous congestion.  - Hold further diuretics for now - Daily weights - Strict in and out  Chronic hypoxic respiratory failure (HCC) - Continue home 4 L of supplemental oxygen - VBG pending given high risk for hypercarbia in the setting of over oxygenation  COPD (chronic obstructive pulmonary disease) (HCC) Patient denies any pulmonary symptoms at this time.  - Continue home bronchodilators  Chronic cor pulmonale (HCC) Per chart review, patient has documented chronic cor pulmonale for several years now, however there is no documented right heart cath  in the chart and there has been no evidence of PASP elevation on any echocardiogram.  Unclear from where this diagnosis arose.  Hypercalcemia In the setting of primary hyperparathyroidism.  Increased compared to baseline but  relatively stable mild. Improved with IV fluid  Obstructive sleep apnea Per chart review, patient is noncompliant with CPAP.   Subjective: Patient continued to feel weak when seen during morning rounds.  DIL at bedside  Physical Exam: Vitals:   03/31/23 2000 04/01/23 0413 04/01/23 0800 04/01/23 1156  BP: 126/69 (!) 130/59 124/67 (!) 126/57  Pulse: (!) 107 88 81 78  Resp: 20 18 19 17   Temp: 98.3 F (36.8 C) 98 F (36.7 C) (!) 97.5 F (36.4 C) 98 F (36.7 C)  TempSrc: Oral     SpO2: 96% 97% 98% 100%  Weight:      Height:       General.  Morbidly obese elderly lady, in no acute distress. Pulmonary.  Lungs clear bilaterally, normal respiratory effort. CV.  Regular rate and rhythm, no JVD, rub or murmur. Abdomen.  Soft, nontender, nondistended, BS positive. CNS.  Alert and oriented .  No focal neurologic deficit. Extremities.  No edema, no cyanosis, pulses intact and symmetrical. Psychiatry.  Judgment and insight appears normal.   Data Reviewed: Prior data reviewed  Family Communication: Discussed with DIL at bedside  Disposition: Status is: Inpatient Remains inpatient appropriate because: Need rehab placement  Planned Discharge Destination: Skilled nursing facility  DVT prophylaxis.  Eliquis Time spent: 45 minutes  This record has been created using Conservation officer, historic buildings. Errors have been sought and corrected,but may not always be located. Such creation errors do not reflect on the standard of care.   Author: Arnetha Courser, MD 04/01/2023 2:54 PM  For on call review [email or web address].

## 2023-04-01 NOTE — Progress Notes (Signed)
PHARMACY CONSULT NOTE - ELECTROLYTES  Pharmacy Consult for Electrolyte Monitoring and Replacement   Recent Labs: Height: 4\' 10"  (147.3 cm) Weight: 93 kg (205 lb) IBW/kg (Calculated) : 40.9 Estimated Creatinine Clearance: 47.5 mL/min (by C-G formula based on SCr of 0.95 mg/dL). Potassium (mmol/L)  Date Value  04/01/2023 2.6 (LL)  07/09/2014 3.4 (L)   Magnesium (mg/dL)  Date Value  19/14/7829 2.3   Calcium (mg/dL)  Date Value  56/21/3086 10.3   Calcium, Total (mg/dL)  Date Value  57/84/6962 10.5 (H)   Albumin (g/dL)  Date Value  95/28/4132 3.4 (L)  10/14/2014 3.9   Phosphorus (mg/dL)  Date Value  44/07/270 2.7   Sodium (mmol/L)  Date Value  04/01/2023 135  01/10/2016 138  07/09/2014 139   Corrected Ca: 11.6 mg/dL  Assessment  Ann Silva is a 77 y.o. female presenting with generalized weakness. PMH significant for chronic hypoxic respiratory failure on 4 L at baseline, chronic cor pulmonale, recurrent UTI, COPD, colon cancer s/p colectomy, hypertension, lower extremity edema on Lasix . Pharmacy has been consulted to monitor and replace electrolytes.  Goal of Therapy: Electrolytes WNL  Plan:  Potassium level remains subtherapeutic this morning. Mg and Phos are both WNL. Patient already given KCl 40 mEq PO x 1 this AM Will give an additional KCl 10 mEq IV x 4 Will check repeat potassium level 2-4 hours after last dose Check BMP and Mg with AM labs  Thank you for involving pharmacy in this patient's care.   Rockwell Alexandria, PharmD Clinical Pharmacist 04/01/2023 7:23 AM

## 2023-04-02 DIAGNOSIS — I5032 Chronic diastolic (congestive) heart failure: Secondary | ICD-10-CM | POA: Insufficient documentation

## 2023-04-02 DIAGNOSIS — E876 Hypokalemia: Secondary | ICD-10-CM | POA: Diagnosis not present

## 2023-04-02 DIAGNOSIS — R600 Localized edema: Secondary | ICD-10-CM | POA: Diagnosis not present

## 2023-04-02 DIAGNOSIS — R531 Weakness: Secondary | ICD-10-CM | POA: Diagnosis not present

## 2023-04-02 DIAGNOSIS — N179 Acute kidney failure, unspecified: Secondary | ICD-10-CM | POA: Diagnosis not present

## 2023-04-02 LAB — BASIC METABOLIC PANEL
Anion gap: 11 (ref 5–15)
BUN: 22 mg/dL (ref 8–23)
CO2: 31 mmol/L (ref 22–32)
Calcium: 10.6 mg/dL — ABNORMAL HIGH (ref 8.9–10.3)
Chloride: 94 mmol/L — ABNORMAL LOW (ref 98–111)
Creatinine, Ser: 0.92 mg/dL (ref 0.44–1.00)
GFR, Estimated: >60 mL/min (ref >60)
Glucose, Bld: 117 mg/dL — ABNORMAL HIGH (ref 70–99)
Potassium: 2.9 mmol/L — ABNORMAL LOW (ref 3.5–5.1)
Sodium: 136 mmol/L (ref 135–145)

## 2023-04-02 LAB — VITAMIN D 25 HYDROXY (VIT D DEFICIENCY, FRACTURES): Vit D, 25-Hydroxy: 29.89 ng/mL — ABNORMAL LOW (ref 30–100)

## 2023-04-02 LAB — MAGNESIUM: Magnesium: 2 mg/dL (ref 1.7–2.4)

## 2023-04-02 MED ORDER — POTASSIUM CHLORIDE CRYS ER 20 MEQ PO TBCR: 40.0000 meq | EXTENDED_RELEASE_TABLET | Freq: Three times a day (TID) | ORAL | Status: DC

## 2023-04-02 MED ORDER — VITAMIN D 25 MCG (1000 UNIT) PO TABS: 2000.0000 [IU] | ORAL_TABLET | Freq: Every day | ORAL | Status: DC

## 2023-04-02 MED ORDER — VITAMIN D 25 MCG (1000 UNIT) PO TABS
2000.0000 [IU] | ORAL_TABLET | Freq: Every day | ORAL | Status: DC
  Administered 2023-04-02 – 2023-04-06 (×5): 2000 [IU] via ORAL
  Filled 2023-04-02 (×5): qty 2

## 2023-04-02 MED ORDER — POTASSIUM CHLORIDE CRYS ER 20 MEQ PO TBCR
40.0000 meq | EXTENDED_RELEASE_TABLET | Freq: Once | ORAL | Status: AC
  Administered 2023-04-02: 40 meq via ORAL
  Filled 2023-04-02: qty 2

## 2023-04-02 MED ORDER — POTASSIUM CHLORIDE 10 MEQ/100ML IV SOLN
10.0000 meq | INTRAVENOUS | Status: AC
  Administered 2023-04-02 (×2): 10 meq via INTRAVENOUS
  Filled 2023-04-02 (×2): qty 100

## 2023-04-02 MED ORDER — FUROSEMIDE 10 MG/ML IJ SOLN
40.0000 mg | Freq: Two times a day (BID) | INTRAMUSCULAR | Status: DC
  Administered 2023-04-02 – 2023-04-04 (×6): 40 mg via INTRAVENOUS
  Filled 2023-04-02 (×6): qty 4

## 2023-04-02 NOTE — Assessment & Plan Note (Addendum)
Resolved.  Creatinine holding even with IV diuresis.

## 2023-04-02 NOTE — TOC Progression Note (Signed)
Transition of Care [facility]) - Progression Note    Patient Details  Name: Ann Silva MRN: [id number] Date of Birth: [birth year]  Transition of Care [facility]) CM/SW Contact  Allena Katz, LCSW Phone Number: 04/02/2023, 4:20 PM  Clinical Narrative:   CSW attempted to see pt regarding HRA and SNF but pt sleeping and unable to be awoken. Per notes pt does not want SNF but TOC to follow up tomorrow.         Expected Discharge Plan and Services                                               Social Determinants of Health (SDOH) Interventions SDOH Screenings   Food Insecurity: No Food Insecurity (03/31/2023)  Housing: Patient Declined (03/31/2023)  Transportation Needs: No Transportation Needs (03/31/2023)  Utilities: Not At Risk (03/31/2023)  Financial Resource Strain: Low Risk  (03/05/2023)   Received from [facility]  Tobacco Use: Medium Risk (03/31/2023)    Readmission Risk Interventions     No data to display

## 2023-04-02 NOTE — Assessment & Plan Note (Signed)
Continue home inhalers

## 2023-04-02 NOTE — Progress Notes (Signed)
Progress Note   Patient: Ann Silva ONG:[id number] DOB: Aug 23, 1945 DOA: 03/31/2023     1 DOS: the patient was seen and examined on 04/02/2023   Brief hospital course: Taken from H&P.  Ann Silva is a 77 y.o. female with medical history significant of chronic hypoxic respiratory failure on 4 L at baseline, chronic cor pulmonale, recurrent UTI, COPD, colon cancer s/p colectomy, hypertension, lower extremity edema on Lasix, who presents to the ED due to generalized weakness for 4 to 5 days.   Due to gradually worsening weakness throughout, she has been unable to perform ADLs. She also endorses abdominal distention but states this has been occurring for the last 6 weeks at least and does not seem to have acutely worsened but just has not improved yet. She denies any chest pain, palpitations, shortness of breath, cough, nausea, vomiting, diarrhea, abdominal pain. She denies any urinary symptoms at this time.   On presenting to ED she had stable vitals, saturating well on baseline oxygen requirement of 4 L.  Labs pertinent for sodium 133, potassium 2.6, bicarb 29, BUN 30, creatinine 1.58, calcium 11.1.  Troponin and BNP was normal.  Elevated lactic acid at 4.1 which improved to 1.8 with IV fluid.  UA with trace leukocytes and rare bacteria.  Chest x-ray with no acute cardiopulmonary disease.  Patient did receive IV Lasix in ED which was not continued as she appears euvolemic and needing some IV fluid.  9/24: Patient with persistent hypokalemia with normal magnesium, potassium is being repleted. Preliminary blood cultures negative, TSH and CK normal, B12 920.  AKI resolved.  Calcium normalized. PT and OT are recommending SNF. 9/25.  Patient complains of swelling of the lower extremities and difficulty walking secondary to swelling.  Patient interested in going back on her water pills.  Patient interested in going home and not to rehab.  No pain behind her right knee today.    Assessment and  Plan: * Bilateral lower extremity edema Restart IV Lasix, TED hose.  Patient advised to eat and drink.  Hypokalemia Replace potassium aggressively especially with restarting Lasix.  Generalized weakness PT recommending rehab but patient would like to go home, CK normal at 117.  AKI (acute kidney injury) (HCC) Resolved but watch closely with restarting Lasix.  Chronic hypoxic respiratory failure (HCC) Chronically on 4 L of oxygen.  pCO2 55.  COPD (chronic obstructive pulmonary disease) (HCC) Continue home inhalers  Obstructive sleep apnea CPAP at night  Chronic diastolic CHF (congestive heart failure) (HCC) Restart Lasix, will consider [place].  EF 55%.  Hypercalcemia Check PTH.  Vitamin D level low will replace vitamin D.  Calcium may increase with diuresis.        Subjective: Patient awakened from sleep.  States she feels weak and tired.  States her legs are swollen and has difficulty moving around.  No pain behind her right knee today.  Patient is interested in going home and not to rehab.  Physical Exam: Vitals:   04/02/23 0001 04/02/23 0401 04/02/23 0745 04/02/23 1318  BP: 107/63 110/60 100/63 125/65  Pulse: 65 65 60 63  Resp:   14 16  Temp: 98.2 F (36.8 C) 97.7 F (36.5 C) 98.2 F (36.8 C) 98.2 F (36.8 C)  TempSrc:  Oral    SpO2: 100% 99% 97% 94%  Weight:      Height:       Physical Exam HENT:     Head: Normocephalic.     Mouth/Throat:  Pharynx: No oropharyngeal exudate.  Eyes:     General: Lids are normal.     Conjunctiva/sclera: Conjunctivae normal.  Cardiovascular:     Rate and Rhythm: Normal rate and regular rhythm.     Heart sounds: Normal heart sounds, S1 normal and S2 normal.  Pulmonary:     Breath sounds: No decreased breath sounds, wheezing, rhonchi or rales.  Abdominal:     Palpations: Abdomen is soft.     Tenderness: There is no abdominal tenderness.  Musculoskeletal:     Right knee: No tenderness.     Right lower leg: Swelling  present.     Left lower leg: Swelling present.  Skin:    General: Skin is warm.     Findings: No rash.  Neurological:     Mental Status: She is alert.     Comments: Answers questions appropriately.  Able to straight leg raise and bend at the ankles.  Passive range of motion right knee is good range of motion.     Data Reviewed: Potassium 2.9, calcium 10.6, vitamin D29.89, platelets 454, creatinine 0.92  Family Communication: Spoke with family at the bedside  Disposition: Status is: Inpatient Remains inpatient appropriate because: Starting IV Lasix to remove fluid, TED hose ordered.  Planned Discharge Destination: Physical therapy recommending rehab but patient would like to go home.    Time spent: 28 minutes  Author: Alford Highland, MD 04/02/2023 2:22 PM  For on call review [email or web address].

## 2023-04-02 NOTE — Progress Notes (Signed)
Physical Therapy Evaluation Patient Details Name: Ann Silva MRN: [id number] DOB: 01/11/1946 Today's Date: 04/02/2023  History of Present Illness  Ann Silva is a 77 y.o. female with medical history significant of chronic hypoxic respiratory failure on 4 L at baseline, chronic cor pulmonale, recurrent UTI, COPD, colon cancer s/p colectomy, hypertension, lower extremity edema on Lasix, who presents to the ED due to generalized weakness for 4 to 5 days.    Clinical Impression  Chart Reviewed. Patient agreeable to PT evaluation this date. PTA, patient reports was using rollator for mobility and was Mod I for ADLs. Lives in a single level home with 2 STE, and husband/sister available to assist PRN. Patient reports increased weakness over the last few days. NO pain reported at time of evaluation. Today, on evaluation, patient required Mod A to obtain upright position, but Max A to scoot to EOB. Denies lightheadedness. Patient was able to stand from EOB with Min A +2, and able to transfer from bed > recliner with Min A +2. Verbal cues required for completion. Nursing notified of patient position. Patient level in recliner with chair alarm, and all needs in reach. Patient will benefit from skilled acute PT services to address impairments (see below for additional details) and maximize independence functional mobility. Anticipate the need for PT services upon hospital discharge.       If plan is discharge home, recommend the following: A lot of help with walking and/or transfers;A lot of help with bathing/dressing/bathroom;Assist for transportation;Help with stairs or ramp for entrance   Can travel by private vehicle   No    Equipment Recommendations Other (comment) (TBD at next level of care)  Recommendations for Other Services       Functional Status Assessment Patient has had a recent decline in their functional status and demonstrates the ability to make significant improvements in  function in a reasonable and predictable amount of time.     Precautions / Restrictions Precautions Precautions: Fall Restrictions Weight Bearing Restrictions: No      Mobility  Bed Mobility Overal bed mobility: Needs Assistance Bed Mobility: Supine to Sit     Supine to sit: Mod assist, HOB elevated, Used rails     General bed mobility comments: Patient require increased time/effort, able to get to upright position with Mod A, but require use of chuck pad to fully scoot to EOB, mild posterior lean initially requiring Min A but able to correct with cues and positioning.    Transfers Overall transfer level: Needs assistance Equipment used: Rolling walker (2 wheels) Transfers: Sit to/from Stand, Bed to chair/wheelchair/BSC Sit to Stand: Min assist, +2 physical assistance   Step pivot transfers: Min assist, +2 physical assistance       General transfer comment: Pt able to stand from EOB with Min A +2 with use of Rw, VC's for hand placement. Patient able to lateral step and some anterior/posterior steps to transfer over to recliner. Very minimal foot clearance noted.    Ambulation/Gait Ambulation/Gait assistance: Min assist, +2 physical assistance Gait Distance (Feet): 3 Feet Assistive device: Rolling walker (2 wheels)   Gait velocity: Decreased     General Gait Details: some lateral and anterior/posterior steps during transfer from bed to recliner, Min A +2 and use of Rw. minimal foot clearance noted.  Stairs            Wheelchair Mobility     Tilt Bed    Modified Rankin (Stroke Patients Only)  Balance Overall balance assessment: Needs assistance Sitting-balance support: Bilateral upper extremity supported, Feet supported Sitting balance-Leahy Scale: Fair Sitting balance - Comments: Occasional posterior lean, with intermittent MIN A progressiong to CGA to correct sitting EOB Postural control: Posterior lean Standing balance support: Bilateral upper  extremity supported, During functional activity, Reliant on assistive device for balance Standing balance-Leahy Scale: Fair Standing balance comment: reliant on AD                             Pertinent Vitals/Pain Pain Assessment Pain Assessment: No/denies pain    Home Living Family/patient expects to be discharged to:: Private residence Living Arrangements: Spouse/significant other Available Help at Discharge: Family;Available 24 hours/day Type of Home: House Home Access: Stairs to enter Entrance Stairs-Rails: Doctor, general practice of Steps: 2   Home Layout: One level Home Equipment: Rollator (4 wheels);Cane - single point;BSC/3in1;Shower seat;Grab bars - toilet;Grab bars - tub/shower;Other (comment) (Lift Bed)      Prior Function Prior Level of Function : Independent/Modified Independent             Mobility Comments: Reports was Mod I with use of Rollator ADLs Comments: Does not drive, IND in BADLs. Husband does cooking, Pharmacologist, can provide some level of physical assist     Extremity/Trunk Assessment   Upper Extremity Assessment Upper Extremity Assessment: Generalized weakness    Lower Extremity Assessment Lower Extremity Assessment: Generalized weakness       Communication   Communication Communication: No apparent difficulties  Cognition Arousal: Alert Behavior During Therapy: WFL for tasks assessed/performed Overall Cognitive Status: Within Functional Limits for tasks assessed                                          General Comments General comments (skin integrity, edema, etc.): improvements in swelling in BLE noted    Exercises     Assessment/Plan    PT Assessment Patient needs continued PT services  PT Problem List Decreased strength;Decreased activity tolerance;Decreased balance;Decreased mobility;Pain       PT Treatment Interventions DME instruction;Gait training;Functional mobility  training;Therapeutic activities;Therapeutic exercise;Balance training    PT Goals (Current goals can be found in the Care Plan section)  Acute Rehab PT Goals Patient Stated Goal: Get my strength back PT Goal Formulation: With patient Time For Goal Achievement: 04/16/23 Potential to Achieve Goals: Good    Frequency Min 1X/week     Co-evaluation PT/OT/SLP Co-Evaluation/Treatment: Yes Reason for Co-Treatment: For patient/therapist safety;To address functional/ADL transfers PT goals addressed during session: Mobility/safety with mobility OT goals addressed during session: ADL's and self-care       AM-PAC PT "6 Clicks" Mobility  Outcome Measure Help needed turning from your back to your side while in a flat bed without using bedrails?: A Lot Help needed moving from lying on your back to sitting on the side of a flat bed without using bedrails?: A Lot Help needed moving to and from a bed to a chair (including a wheelchair)?: A Lot Help needed standing up from a chair using your arms (e.g., wheelchair or bedside chair)?: A Lot Help needed to walk in hospital room?: A Lot Help needed climbing 3-5 steps with a railing? : Total 6 Click Score: 11    End of Session Equipment Utilized During Treatment: Gait belt Activity Tolerance: Patient tolerated treatment well Patient left: in chair;with  chair alarm set;with call bell/phone within reach Nurse Communication: Mobility status PT Visit Diagnosis: Unsteadiness on feet (R26.81);Other abnormalities of gait and mobility (R26.89);Muscle weakness (generalized) (M62.81);Difficulty in walking, not elsewhere classified (R26.2)    Time: 2440-1027 PT Time Calculation (min) (ACUTE ONLY): 21 min   Charges:   PT Evaluation $PT Eval Moderate Complexity: 1 Mod   PT General Charges $$ ACUTE PT VISIT: 1 Visit         Creed Copper Fairly, PT, DPT 04/02/23 11:01 AM

## 2023-04-02 NOTE — Progress Notes (Signed)
PHARMACY CONSULT NOTE - ELECTROLYTES  Pharmacy Consult for Electrolyte Monitoring and Replacement   Recent Labs: Height: 4\' 10"  (147.3 cm) Weight: 93 kg (205 lb) IBW/kg (Calculated) : 40.9 Estimated Creatinine Clearance: 49.1 mL/min (by C-G formula based on SCr of 0.92 mg/dL). Potassium (mmol/L)  Date Value  04/02/2023 2.9 (L)  07/09/2014 3.4 (L)   Magnesium (mg/dL)  Date Value  30/86/5784 2.0   Calcium (mg/dL)  Date Value  69/62/9528 10.6 (H)   Calcium, Total (mg/dL)  Date Value  41/32/4401 10.5 (H)   Albumin (g/dL)  Date Value  02/72/5366 3.4 (L)  10/14/2014 3.9   Phosphorus (mg/dL)  Date Value  44/09/4740 2.7   Sodium (mmol/L)  Date Value  04/02/2023 136  01/10/2016 138  07/09/2014 139   Corrected Ca: 10.7 mg/dL  Assessment  Ann Silva is a 77 y.o. female presenting with generalized weakness. PMH significant for chronic hypoxic respiratory failure on 4 L at baseline, chronic cor pulmonale, recurrent UTI, COPD, colon cancer s/p colectomy, hypertension, lower extremity edema on Lasix . Pharmacy has been consulted to monitor and replace electrolytes.  Goal of Therapy: Electrolytes WNL  Plan:  Give KCL 40 mEq PO x1 and KCL 10 mEq IV x2 Mg, Phos, and Ca WNL Check BMP and Mag with AM labs  Thank you for involving pharmacy in this patient's care.   Rockwell Alexandria, PharmD Clinical Pharmacist 04/02/2023 8:11 AM

## 2023-04-02 NOTE — Assessment & Plan Note (Signed)
-  CPAP at night

## 2023-04-02 NOTE — Assessment & Plan Note (Addendum)
Lasix held yesterday with overdiuresis.  Restarted torsemide today.  Will give torsemide 20 mg daily.  Weigh patient on a daily basis and if he gains 3 pounds in a day or 5 pounds in a week can take an extra torsemide.

## 2023-04-02 NOTE — Assessment & Plan Note (Signed)
Chronically on 4 L of oxygen.  pCO2 55.

## 2023-04-02 NOTE — Progress Notes (Signed)
Occupational Therapy Treatment Patient Details Name: Ann Silva MRN: [id number] DOB: [birth year] Today's Date: 04/02/2023   History of present illness Patient is a 77 year old female with complaints of leg swelling, chest pain, shortness of breath.   OT comments  Pt seen for OT/PT co-tx this date for pt/therapist safety and for assessment of functional transfers. Pt in bed with RN present in room, endorsing 0/10 pain. Pt agreeable to treatment session. Requires MOD A for bed mobility, posterior lean seated EOB, transfers with minA +2 using RW from EOB > recliner. Pt performing seated grooming tasks in recliner with setup. 0/10 pain end of session after mobility. Pt continues to present with generalized weakness, a significant decline in function, and impaired balance which impacts safe, efficient ADL performance. Pt making good progress toward goals, will continue to follow POC. Discharge recommendation remains appropriate. RN updated on mobility status. Pt left in recliner, needs within reach and alarm activated.       If plan is discharge home, recommend the following:  Two people to help with walking and/or transfers;A lot of help with bathing/dressing/bathroom;Assistance with cooking/housework;Direct supervision/assist for medications management;Direct supervision/assist for financial management;Assist for transportation;Help with stairs or ramp for entrance   Equipment Recommendations  Other (comment)    Recommendations for Other Services Other (comment)    Precautions / Restrictions Precautions Precautions: Fall Restrictions Weight Bearing Restrictions: No       Mobility Bed Mobility Overal bed mobility: Needs Assistance Bed Mobility: Supine to Sit     Supine to sit: Mod assist, HOB elevated     General bed mobility comments: Improved from yesterday's performance    Transfers Overall transfer level: Needs assistance Equipment used: Rolling walker (2  wheels) Transfers: Bed to chair/wheelchair/BSC       Step pivot transfers: +2 physical assistance, Min assist     General transfer comment: Cues for technique     Balance Overall balance assessment: Needs assistance Sitting-balance support: Bilateral upper extremity supported Sitting balance-Leahy Scale: Fair   Postural control: Posterior lean Standing balance support: Reliant on assistive device for balance, During functional activity, Bilateral upper extremity supported Standing balance-Leahy Scale: Fair Standing balance comment: steady in static standing with RW                           ADL either performed or assessed with clinical judgement   ADL Overall ADL's : Needs assistance/impaired     Grooming: Wash/dry face;Wash/dry hands;Set up;Sitting                   Toilet Transfer: Minimal assistance;+2 for physical assistance;Rolling walker (2 wheels);Cueing for sequencing;Cueing for safety;Stand-pivot Toilet Transfer Details (indicate cue type and reason): simulated from bed > recliner, minA+2 with RW         Functional mobility during ADLs: Rollator (4 wheels);+2 for physical assistance;Cueing for sequencing;Minimal assistance General ADL Comments: Pt transfers to recliner with overall slow movements and minA+2 assist using step pivot technique. Pt appearing fatigued but no c/o pain this date which has improved from yesterday. BLE swelling also appearing to decrease. Sp02 94% on 4L.      Cognition Arousal: Alert Behavior During Therapy: WFL for tasks assessed/performed Overall Cognitive Status: Within Functional Limits for tasks assessed  Pertinent Vitals/ Pain       Pain Assessment Pain Assessment: No/denies pain   Frequency  Min 1X/week        Progress Toward Goals  OT Goals(current goals can now be found in the care plan section)  Progress towards OT goals:  Progressing toward goals  Acute Rehab OT Goals OT Goal Formulation: With patient Time For Goal Achievement: 04/15/23 Potential to Achieve Goals: Fair  Plan      Co-evaluation    PT/OT/SLP Co-Evaluation/Treatment: Yes Reason for Co-Treatment: For patient/therapist safety;To address functional/ADL transfers PT goals addressed during session: Mobility/safety with mobility;Balance;Proper use of DME OT goals addressed during session: ADL's and self-care;Proper use of Adaptive equipment and DME      AM-PAC OT "6 Clicks" Daily Activity     Outcome Measure   Help from another person eating meals?: None Help from another person taking care of personal grooming?: A Little Help from another person toileting, which includes using toliet, bedpan, or urinal?: A Lot Help from another person bathing (including washing, rinsing, drying)?: A Lot Help from another person to put on and taking off regular upper body clothing?: A Lot Help from another person to put on and taking off regular lower body clothing?: A Lot 6 Click Score: 15    End of Session Equipment Utilized During Treatment: Oxygen;Gait belt;Rolling walker (2 wheels)  OT Visit Diagnosis: Muscle weakness (generalized) (M62.81);Unsteadiness on feet (R26.81);Other abnormalities of gait and mobility (R26.89)   Activity Tolerance Patient tolerated treatment well   Patient Left in chair;with call bell/phone within reach;with chair alarm set   Nurse Communication Mobility status        Time: 1610-9604 OT Time Calculation (min): 23 min  Charges: OT General Charges $OT Visit: 1 Visit OT Treatments $Self Care/Home Management : 8-22 mins  Antoinette Borgwardt L. Breven Guidroz, OTR/L  04/02/23, 10:46 AM

## 2023-04-02 NOTE — Assessment & Plan Note (Deleted)
Check PTH.  Vitamin D level low will replace vitamin D.  Calcium may increase with diuresis.

## 2023-04-02 NOTE — Assessment & Plan Note (Addendum)
PT recommending rehab but patient would like to go home.

## 2023-04-02 NOTE — Assessment & Plan Note (Addendum)
EF 55%.  Ann Silva is too expensive.  Today's weight is 195.77.  As outpatient dating back to 4/11 she was 190 at the lowest point.  Holding Lasix and spironolactone today with creatinine creeping up.  Check BMP tomorrow.

## 2023-04-02 NOTE — Assessment & Plan Note (Addendum)
Replaced during the hospital course.  Zaroxolyn likely playing a role.  Will send home on torsemide and spironolactone to compensate.

## 2023-04-03 DIAGNOSIS — R600 Localized edema: Secondary | ICD-10-CM | POA: Diagnosis not present

## 2023-04-03 DIAGNOSIS — J439 Emphysema, unspecified: Secondary | ICD-10-CM

## 2023-04-03 DIAGNOSIS — M10071 Idiopathic gout, right ankle and foot: Secondary | ICD-10-CM

## 2023-04-03 DIAGNOSIS — M109 Gout, unspecified: Secondary | ICD-10-CM

## 2023-04-03 DIAGNOSIS — R531 Weakness: Secondary | ICD-10-CM | POA: Diagnosis not present

## 2023-04-03 DIAGNOSIS — E876 Hypokalemia: Secondary | ICD-10-CM | POA: Diagnosis not present

## 2023-04-03 LAB — BASIC METABOLIC PANEL
Anion gap: 14 (ref 5–15)
BUN: 27 mg/dL — ABNORMAL HIGH (ref 8–23)
CO2: 33 mmol/L — ABNORMAL HIGH (ref 22–32)
Calcium: 11 mg/dL — ABNORMAL HIGH (ref 8.9–10.3)
Chloride: 90 mmol/L — ABNORMAL LOW (ref 98–111)
Creatinine, Ser: 0.91 mg/dL (ref 0.44–1.00)
GFR, Estimated: >60 mL/min (ref >60)
Glucose, Bld: 127 mg/dL — ABNORMAL HIGH (ref 70–99)
Potassium: 2.6 mmol/L — CL (ref 3.5–5.1)
Sodium: 137 mmol/L (ref 135–145)

## 2023-04-03 LAB — CULTURE, BLOOD (ROUTINE X 2): Special Requests: ADEQUATE

## 2023-04-03 LAB — URIC ACID: Uric Acid, Serum: 12.9 mg/dL — ABNORMAL HIGH (ref 2.5–7.1)

## 2023-04-03 LAB — POTASSIUM: Potassium: 3.6 mmol/L (ref 3.5–5.1)

## 2023-04-03 LAB — PARATHYROID HORMONE, INTACT (NO CA): PTH: 99 pg/mL — ABNORMAL HIGH (ref 15–65)

## 2023-04-03 LAB — MAGNESIUM: Magnesium: 1.7 mg/dL (ref 1.7–2.4)

## 2023-04-03 MED ORDER — SPIRONOLACTONE 25 MG PO TABS
25.0000 mg | ORAL_TABLET | Freq: Every day | ORAL | Status: DC
  Administered 2023-04-03 – 2023-04-04 (×2): 25 mg via ORAL
  Filled 2023-04-03 (×2): qty 1

## 2023-04-03 MED ORDER — TEMAZEPAM 15 MG PO CAPS
30.0000 mg | ORAL_CAPSULE | Freq: Every day | ORAL | Status: DC
  Administered 2023-04-03 – 2023-04-05 (×3): 30 mg via ORAL
  Filled 2023-04-03 (×3): qty 2

## 2023-04-03 MED ORDER — POTASSIUM CHLORIDE 10 MEQ/100ML IV SOLN
10.0000 meq | INTRAVENOUS | Status: AC
  Administered 2023-04-03 (×3): 10 meq via INTRAVENOUS
  Filled 2023-04-03 (×2): qty 100

## 2023-04-03 MED ORDER — POTASSIUM CHLORIDE 10 MEQ/100ML IV SOLN
10.0000 meq | Freq: Once | INTRAVENOUS | Status: AC
  Administered 2023-04-03: 10 meq via INTRAVENOUS
  Filled 2023-04-03: qty 100

## 2023-04-03 MED ORDER — TEMAZEPAM 30 MG PO CAPS: 30.0000 mg | ORAL_CAPSULE | Freq: Every day | ORAL | Status: DC

## 2023-04-03 MED ORDER — POTASSIUM CHLORIDE CRYS ER 20 MEQ PO TBCR
40.0000 meq | EXTENDED_RELEASE_TABLET | Freq: Once | ORAL | Status: AC
  Administered 2023-04-03: 40 meq via ORAL
  Filled 2023-04-03: qty 2

## 2023-04-03 MED ORDER — MAGNESIUM SULFATE 2 GM/50ML IV SOLN
2.0000 g | Freq: Once | INTRAVENOUS | Status: AC
  Administered 2023-04-03: 2 g via INTRAVENOUS
  Filled 2023-04-03: qty 50

## 2023-04-03 MED ORDER — INDOMETHACIN 25 MG PO CAPS
25.0000 mg | ORAL_CAPSULE | Freq: Two times a day (BID) | ORAL | Status: DC
  Administered 2023-04-03 – 2023-04-04 (×2): 25 mg via ORAL
  Filled 2023-04-03 (×2): qty 1

## 2023-04-03 MED ORDER — COLCHICINE 0.6 MG PO TABS
0.6000 mg | ORAL_TABLET | Freq: Once | ORAL | Status: AC
  Administered 2023-04-03: 0.6 mg via ORAL
  Filled 2023-04-03: qty 1

## 2023-04-03 MED ORDER — COLCHICINE 0.6 MG PO TABS
0.6000 mg | ORAL_TABLET | Freq: Every day | ORAL | Status: DC
  Administered 2023-04-03: 0.6 mg via ORAL
  Filled 2023-04-03 (×2): qty 1

## 2023-04-03 MED ORDER — DAPAGLIFLOZIN PROPANEDIOL 10 MG PO TABS: 10.0000 mg | ORAL_TABLET | Freq: Every day | ORAL | Status: DC

## 2023-04-03 MED ORDER — POTASSIUM CHLORIDE CRYS ER 20 MEQ PO TBCR
40.0000 meq | EXTENDED_RELEASE_TABLET | Freq: Two times a day (BID) | ORAL | Status: DC
  Administered 2023-04-03 – 2023-04-04 (×4): 40 meq via ORAL
  Filled 2023-04-03 (×4): qty 2

## 2023-04-03 NOTE — Assessment & Plan Note (Addendum)
Right ankle and foot.  Patient declined steroid.  Continue colchicine twice daily and 3 times daily indomethacin.

## 2023-04-03 NOTE — Progress Notes (Signed)
Physical Therapy Treatment Patient Details Name: Ann Silva MRN: [id number] DOB: 05-22-1946 Today's Date: 04/03/2023   History of Present Illness Ann Silva is a 77 y.o. female with medical history significant of chronic hypoxic respiratory failure on 4 L at baseline, chronic cor pulmonale, recurrent UTI, COPD, colon cancer s/p colectomy, hypertension, lower extremity edema on Lasix, who presents to the ED due to generalized weakness for 4 to 5 days.    PT Comments  Patient received in bed, she requires encouragement to participate and get up out of bed. Patient requires min A for bed mobility, transfers and ambulation of a few feet this session. She requires cues for improved technique during bed mobility and transfers. Patient is reliant on RW for mobility and safety. She is slowly progressing with mobility and will continue to benefit from skilled PT to improve strength and independence.          If plan is discharge home, recommend the following: A lot of help with walking and/or transfers;A lot of help with bathing/dressing/bathroom;Assist for transportation;Help with stairs or ramp for entrance   Can travel by private vehicle     No  Equipment Recommendations  None recommended by PT    Recommendations for Other Services       Precautions / Restrictions Precautions Precautions: Fall Restrictions Weight Bearing Restrictions: No     Mobility  Bed Mobility Overal bed mobility: Needs Assistance Bed Mobility: Supine to Sit     Supine to sit: Min assist, HOB elevated, Used rails     General bed mobility comments: Min A for getting trunk upright and heavy use of bed rails. Cues for scooting forward requiring min A    Transfers Overall transfer level: Needs assistance Equipment used: Rolling walker (2 wheels) Transfers: Sit to/from Stand Sit to Stand: Min assist   Step pivot transfers: Contact guard assist       General transfer comment: Pt able to stand from  EOB with Min A with use of Rw, VC's for hand placement. Step pivot to BSC cga.  Very minimal foot clearance noted.    Ambulation/Gait Ambulation/Gait assistance: Min assist Gait Distance (Feet): 4 Feet Assistive device: Rolling walker (2 wheels) Gait Pattern/deviations: Step-to pattern, Decreased step length - right, Decreased step length - left, Decreased stride length Gait velocity: Decreased     General Gait Details: ambulated from [facility] to recliner. Decreased activity tolerance, weak LEs   Stairs             Wheelchair Mobility     Tilt Bed    Modified Rankin (Stroke Patients Only)       Balance Overall balance assessment: Needs assistance Sitting-balance support: Feet supported Sitting balance-Leahy Scale: Good Sitting balance - Comments: able to sit unsupported at edge of bed   Standing balance support: Bilateral upper extremity supported, During functional activity, Reliant on assistive device for balance Standing balance-Leahy Scale: Fair                              Cognition Arousal: Alert Behavior During Therapy: WFL for tasks assessed/performed Overall Cognitive Status: Within Functional Limits for tasks assessed                                          Exercises      General Comments  Pertinent Vitals/Pain Pain Assessment Pain Assessment: Faces Faces Pain Scale: Hurts a little bit Pain Location: R foot and leg Pain Descriptors / Indicators: Discomfort, Tightness, Sore Pain Intervention(s): Monitored during session, Repositioned    Home Living                          Prior Function            PT Goals (current goals can now be found in the care plan section) Acute Rehab PT Goals Patient Stated Goal: Get my strength back PT Goal Formulation: With patient Time For Goal Achievement: 04/16/23 Potential to Achieve Goals: Good Progress towards PT goals: Progressing toward goals     Frequency    Min 1X/week      PT Plan      Co-evaluation PT/OT/SLP Co-Evaluation/Treatment: Yes Reason for Co-Treatment: To address functional/ADL transfers;For patient/therapist safety PT goals addressed during session: Mobility/safety with mobility;Balance;Proper use of DME        AM-PAC PT "6 Clicks" Mobility   Outcome Measure  Help needed turning from your back to your side while in a flat bed without using bedrails?: A Lot Help needed moving from lying on your back to sitting on the side of a flat bed without using bedrails?: A Lot Help needed moving to and from a bed to a chair (including a wheelchair)?: A Little Help needed standing up from a chair using your arms (e.g., wheelchair or bedside chair)?: A Little Help needed to walk in hospital room?: A Lot Help needed climbing 3-5 steps with a railing? : Total 6 Click Score: 13    End of Session Equipment Utilized During Treatment: Oxygen Activity Tolerance: Patient limited by fatigue;Patient limited by pain Patient left: in chair;with call bell/phone within reach;with chair alarm set;with family/visitor present Nurse Communication: Mobility status PT Visit Diagnosis: Unsteadiness on feet (R26.81);Other abnormalities of gait and mobility (R26.89);Muscle weakness (generalized) (M62.81);Difficulty in walking, not elsewhere classified (R26.2);Pain Pain - Right/Left: Right Pain - part of body: Ankle and joints of foot;Leg     Time: 1010-1033 PT Time Calculation (min) (ACUTE ONLY): 23 min  Charges:    $Gait Training: 8-22 mins PT General Charges $$ ACUTE PT VISIT: 1 Visit                     Devontay Celaya, PT, GCS 04/03/23,10:44 AM

## 2023-04-03 NOTE — Progress Notes (Signed)
Occupational Therapy Treatment Patient Details Name: Ann Silva MRN: [id number] DOB: 03-Oct-1945 Today's Date: 04/03/2023   History of present illness Ann Silva is a 77 y.o. female with medical history significant of chronic hypoxic respiratory failure on 4 L at baseline, chronic cor pulmonale, recurrent UTI, COPD, colon cancer s/p colectomy, hypertension, lower extremity edema on Lasix, who presents to the ED due to generalized weakness for 4 to 5 days.   OT comments  Pt seen for OT tx. Pt agreeable despite endorsing significant RLE pain distal of knee. RN notified. Pt completed a step pivot to the [facility] with RW and CGA-MIN A +2 for safety. Pt instructed in AE for LB ADL and pt notes she uses a reacher for dressing already. Pt required set up and CGA in standing from [facility] and for pericare while maintaining RUE on RW for stability. Pt required MAX A for donning brief 2/2 poor balance. Pt progressing towards goals and continues to benefit from skilled OT services.       If plan is discharge home, recommend the following:  A lot of help with bathing/dressing/bathroom;Assistance with cooking/housework;Direct supervision/assist for medications management;Direct supervision/assist for financial management;Assist for transportation;Help with stairs or ramp for entrance;A little help with walking and/or transfers   Equipment Recommendations       Recommendations for Other Services      Precautions / Restrictions Precautions Precautions: Fall Restrictions Weight Bearing Restrictions: No       Mobility Bed Mobility Overal bed mobility: Needs Assistance Bed Mobility: Supine to Sit     Supine to sit: Min assist, HOB elevated, Used rails     General bed mobility comments: Min A for getting trunk upright and heavy use of bed rails. Cues for scooting forward requiring min A    Transfers Overall transfer level: Needs assistance Equipment used: Rolling walker (2 wheels) Transfers:  Sit to/from Stand Sit to Stand: Min assist     Step pivot transfers: Contact guard assist     General transfer comment: Pt able to stand from EOB with Min A with use of Rw, VC's for hand placement. Step pivot to BSC cga.  Very minimal foot clearance noted.     Balance Overall balance assessment: Needs assistance Sitting-balance support: Feet supported Sitting balance-Leahy Scale: Good Sitting balance - Comments: able to sit unsupported at edge of bed   Standing balance support: During functional activity, Reliant on assistive device for balance, Single extremity supported Standing balance-Leahy Scale: Fair Standing balance comment: able to perform pericare in standing with UE support on RW and no overt LOB                           ADL either performed or assessed with clinical judgement   ADL Overall ADL's : Needs assistance/impaired                         Toilet Transfer: Contact guard assist;+2 for safety/equipment;BSC/3in1;Rolling walker (2 wheels) Toilet Transfer Details (indicate cue type and reason): step pivot Toileting- Clothing Manipulation and Hygiene: Sit to/from stand;Maximal assistance;Contact guard assist Toileting - Clothing Manipulation Details (indicate cue type and reason): cga for pericare, max A for clothing mgt     Functional mobility during ADLs: +2 for safety/equipment;Contact guard assist;Minimal assistance      Extremity/Trunk Assessment              Vision  Perception     Praxis      Cognition Arousal: Alert Behavior During Therapy: WFL for tasks assessed/performed Overall Cognitive Status: Within Functional Limits for tasks assessed                                          Exercises      Shoulder Instructions       General Comments      Pertinent Vitals/ Pain       Pain Assessment Pain Assessment: 0-10 Pain Score: 9  Pain Location: R foot and leg Pain Descriptors / Indicators:  Discomfort, Tightness, Sore Pain Intervention(s): Monitored during session, Repositioned, Patient requesting pain meds-RN notified  Home Living                                          Prior Functioning/Environment              Frequency  Min 1X/week        Progress Toward Goals  OT Goals(current goals can now be found in the care plan section)  Progress towards OT goals: Progressing toward goals  Acute Rehab OT Goals Patient Stated Goal: go home OT Goal Formulation: With patient Time For Goal Achievement: 04/15/23 Potential to Achieve Goals: Fair  Plan      Co-evaluation    PT/OT/SLP Co-Evaluation/Treatment: Yes Reason for Co-Treatment: To address functional/ADL transfers;For patient/therapist safety PT goals addressed during session: Mobility/safety with mobility;Balance;Proper use of DME OT goals addressed during session: ADL's and self-care      AM-PAC OT "6 Clicks" Daily Activity     Outcome Measure   Help from another person eating meals?: None Help from another person taking care of personal grooming?: A Little Help from another person toileting, which includes using toliet, bedpan, or urinal?: A Lot Help from another person bathing (including washing, rinsing, drying)?: A Lot Help from another person to put on and taking off regular upper body clothing?: A Little Help from another person to put on and taking off regular lower body clothing?: A Lot 6 Click Score: 16    End of Session    OT Visit Diagnosis: Muscle weakness (generalized) (M62.81);Unsteadiness on feet (R26.81);Other abnormalities of gait and mobility (R26.89)   Activity Tolerance Patient tolerated treatment well   Patient Left in chair;with call bell/phone within reach;with chair alarm set;with family/visitor present   Nurse Communication Patient requests pain meds        Time: 1010-1033 OT Time Calculation (min): 23 min  Charges: OT General Charges $OT  Visit: 1 Visit OT Treatments $Self Care/Home Management : 8-22 mins  Arman Filter., MPH, MS, OTR/L ascom [phone] 04/03/23, 12:51 PM

## 2023-04-03 NOTE — Progress Notes (Signed)
CROSS COVER NOTE  NAME: Ann Silva MRN: [id number] DOB : 01/06/1946    Concern as stated by nurse / staff   K 2.6     Pertinent findings on chart review:   Assessment and  Interventions   Assessment: Mag 1.7 Plan: Potassium 40 meq oral 40 IV 2 gm mag      Donnie Mesa NP [facility] Cross Cover 7pm-7am - check amion for availability Pager [phone]

## 2023-04-03 NOTE — Progress Notes (Signed)
Critical lab results of 2.6 potassium received. Provider notified.

## 2023-04-03 NOTE — Progress Notes (Signed)
PHARMACY CONSULT NOTE - ELECTROLYTES  Pharmacy Consult for Electrolyte Monitoring and Replacement   Recent Labs: Height: 4\' 10"  (147.3 cm) Weight: 88.5 kg (195 lb 1.7 oz) IBW/kg (Calculated) : 40.9 Estimated Creatinine Clearance: 48.2 mL/min (by C-G formula based on SCr of 0.91 mg/dL). Potassium (mmol/L)  Date Value  04/03/2023 2.6 (LL)  07/09/2014 3.4 (L)   Magnesium (mg/dL)  Date Value  62/13/0865 1.7   Calcium (mg/dL)  Date Value  78/46/9629 11.0 (H)   Calcium, Total (mg/dL)  Date Value  52/84/1324 10.5 (H)   Albumin (g/dL)  Date Value  40/04/2724 3.4 (L)  10/14/2014 3.9   Phosphorus (mg/dL)  Date Value  36/64/4034 2.7   Sodium (mmol/L)  Date Value  04/03/2023 137  01/10/2016 138  07/09/2014 139   Corrected Ca: 10.6 mg/dL  Assessment  Ann Silva is a 77 y.o. female presenting with generalized weakness. PMH significant for chronic hypoxic respiratory failure on 4 L at baseline, chronic cor pulmonale, recurrent UTI, COPD, colon cancer s/p colectomy, hypertension, lower extremity edema on Lasix . Pharmacy has been consulted to monitor and replace electrolytes.  Goal of Therapy: Electrolytes WNL  Plan:  Give KCL 40 mEq PO x1 and KCL 10 mEq IV x4 as ordered by provider Give magnesium sulfate 2 g IV x1 as ordered by provider Check potassium level 2-4 hours after last dose Check BMP and Mag with AM labs  Thank you for involving pharmacy in this patient's care.   Rockwell Alexandria, PharmD Clinical Pharmacist 04/03/2023 7:48 AM

## 2023-04-03 NOTE — Progress Notes (Signed)
Progress Note   Patient: Ann Silva WUJ:[id number] DOB: 1946/06/04 DOA: 03/31/2023     2 DOS: the patient was seen and examined on 04/03/2023   Brief hospital course: Taken from H&P.  Ann Silva is a 77 y.o. female with medical history significant of chronic hypoxic respiratory failure on 4 L at baseline, chronic cor pulmonale, recurrent UTI, COPD, colon cancer s/p colectomy, hypertension, lower extremity edema on Lasix, who presents to the ED due to generalized weakness for 4 to 5 days.   Due to gradually worsening weakness throughout, she has been unable to perform ADLs. She also endorses abdominal distention but states this has been occurring for the last 6 weeks at least and does not seem to have acutely worsened but just has not improved yet. She denies any chest pain, palpitations, shortness of breath, cough, nausea, vomiting, diarrhea, abdominal pain. She denies any urinary symptoms at this time.   On presenting to ED she had stable vitals, saturating well on baseline oxygen requirement of 4 L.  Labs pertinent for sodium 133, potassium 2.6, bicarb 29, BUN 30, creatinine 1.58, calcium 11.1.  Troponin and BNP was normal.  Elevated lactic acid at 4.1 which improved to 1.8 with IV fluid.  UA with trace leukocytes and rare bacteria.  Chest x-ray with no acute cardiopulmonary disease.  Patient did receive IV Lasix in ED which was not continued as she appears euvolemic and needing some IV fluid.  9/24: Patient with persistent hypokalemia with normal magnesium, potassium is being repleted. Preliminary blood cultures negative, TSH and CK normal, B12 920.  AKI resolved.  Calcium normalized. PT and OT are recommending SNF. 9/25.  Patient complains of swelling of the lower extremities and difficulty walking secondary to swelling.  Patient interested in going back on her water pills.  Patient interested in going home and not to rehab.  No pain behind her right knee today.    Assessment and  Plan: * Bilateral lower extremity edema Continue IV Lasix twice daily, TED hose.   Hypokalemia Replace potassium aggressively especially with restarting Lasix.  Potassium today again low at 2.6.  Added spironolactone to try to compensate for low potassium.  Acute gout Right ankle and foot.  Given a dose of colchicine earlier will repeat again and continue on a daily basis.  Will also give indomethacin for a few doses.  If no response may need to do a steroid.  AKI (acute kidney injury) (HCC) Resolved but watch closely with iv Lasix.  Weakness PT recommending rehab but patient would like to go home, CK normal at 117.  Chronic hypoxic respiratory failure (HCC) Chronically on 4 L of oxygen.  pCO2 55.  COPD (chronic obstructive pulmonary disease) (HCC) Continue home inhalers  Obstructive sleep apnea CPAP at night  Chronic diastolic CHF (congestive heart failure) (HCC) Restart Lasix,  EF 55%.  [name] is too expensive.  Spironolactone added.  Hypercalcemia Check PTH.  Vitamin D level low will replace vitamin D.  Calcium may increase with diuresis.        Subjective: Patient states her weight is the best at 170.  Last weight here 88.5 kg.  As outpatient she was 201 pounds on 9/17 and 194 pounds on 8/26.  Patient complains of pain in her right ankle and foot.  Uric acid high going along with gout.  Physical Exam: Vitals:   04/03/23 0421 04/03/23 0751 04/03/23 1037 04/03/23 1143  BP: 121/66 121/67  112/61  Pulse: 67 67  (!) 58  Resp: 16 16  16   Temp: 97.9 F (36.6 C) 98.5 F (36.9 C)  98.1 F (36.7 C)  TempSrc:      SpO2: 90% 96% 97% 98%  Weight:      Height:       Physical Exam HENT:     Head: Normocephalic.     Mouth/Throat:     Pharynx: No oropharyngeal exudate.  Eyes:     General: Lids are normal.     Conjunctiva/sclera: Conjunctivae normal.  Cardiovascular:     Rate and Rhythm: Normal rate and regular rhythm.     Heart sounds: Normal heart sounds, S1 normal  and S2 normal.  Pulmonary:     Breath sounds: Examination of the right-lower field reveals decreased breath sounds and rales. Examination of the left-lower field reveals decreased breath sounds and rales. Decreased breath sounds and rales present. No wheezing or rhonchi.  Abdominal:     Palpations: Abdomen is soft.     Tenderness: There is no abdominal tenderness.  Musculoskeletal:     Right knee: No tenderness.     Right lower leg: Swelling present.     Left lower leg: Swelling present.     Right ankle: Tenderness present.  Skin:    General: Skin is warm.     Findings: No rash.  Neurological:     Mental Status: She is alert and oriented to person, place, and time.     Data Reviewed: Uric acid 12.9, potassium 2.6, creatinine 0.91, white blood cell count 7.2, hemoglobin 12.5, platelet count 454  Family Communication: Spoke with family at the bedside  Disposition: Status is: Inpatient Remains inpatient appropriate because: Still with very low potassium.  Patient states that she is swollen and wants to get her weight down.  Complains of pain in the right ankle and foot and uric acid high going along with gout attack.  Planned Discharge Destination: Home with Home Health (patient does not want to go to rehab.)    Time spent: 28 minutes  Author: Alford Highland, MD 04/03/2023 2:14 PM  For on call review [email or web address].

## 2023-04-04 DIAGNOSIS — R600 Localized edema: Secondary | ICD-10-CM | POA: Diagnosis not present

## 2023-04-04 DIAGNOSIS — E876 Hypokalemia: Secondary | ICD-10-CM | POA: Diagnosis not present

## 2023-04-04 DIAGNOSIS — M10071 Idiopathic gout, right ankle and foot: Secondary | ICD-10-CM | POA: Diagnosis not present

## 2023-04-04 DIAGNOSIS — N179 Acute kidney failure, unspecified: Secondary | ICD-10-CM | POA: Diagnosis not present

## 2023-04-04 LAB — BASIC METABOLIC PANEL
Anion gap: 10 (ref 5–15)
BUN: 39 mg/dL — ABNORMAL HIGH (ref 8–23)
CO2: 32 mmol/L (ref 22–32)
Calcium: 10.5 mg/dL — ABNORMAL HIGH (ref 8.9–10.3)
Chloride: 97 mmol/L — ABNORMAL LOW (ref 98–111)
Creatinine, Ser: 0.95 mg/dL (ref 0.44–1.00)
GFR, Estimated: >60 mL/min (ref >60)
Glucose, Bld: 115 mg/dL — ABNORMAL HIGH (ref 70–99)
Potassium: 4 mmol/L (ref 3.5–5.1)
Sodium: 139 mmol/L (ref 135–145)

## 2023-04-04 LAB — MAGNESIUM: Magnesium: 2.1 mg/dL (ref 1.7–2.4)

## 2023-04-04 MED ORDER — INDOMETHACIN 25 MG PO CAPS
25.0000 mg | ORAL_CAPSULE | Freq: Three times a day (TID) | ORAL | Status: DC
  Administered 2023-04-04 (×2): 25 mg via ORAL
  Filled 2023-04-04 (×4): qty 1

## 2023-04-04 MED ORDER — COLCHICINE 0.6 MG PO TABS
0.6000 mg | ORAL_TABLET | Freq: Two times a day (BID) | ORAL | Status: DC
  Administered 2023-04-04 – 2023-04-05 (×3): 0.6 mg via ORAL
  Filled 2023-04-04 (×3): qty 1

## 2023-04-04 NOTE — Plan of Care (Signed)

## 2023-04-04 NOTE — Progress Notes (Signed)
Physical Therapy Treatment Patient Details Name: Ann Silva MRN: [id number] DOB: Feb 21, 1946 Today's Date: 04/04/2023   History of Present Illness KAHLAYA Silva is a 77 y.o. female with medical history significant of chronic hypoxic respiratory failure on 4 L at baseline, chronic cor pulmonale, recurrent UTI, COPD, colon cancer s/p colectomy, hypertension, lower extremity edema on Lasix, who presents to the ED due to generalized weakness for 4 to 5 days.    PT Comments  Pt is received in [facility] with NT at bedside, she is agreeable to PT session. Pt performs transfers CGA-min A and amb CGA for safety. At beginning of session Pt reports slight discomfort to RLE secondary to gout. Pt able to amb approx 15 ft using RW CGA for safety. At this time, Pt demonstrates progress towards PT goals as seen by increased amb distance and no increase RLE discomfort/pain. Pt would benefit from cont skilled PT to address above deficits and promote optimal return to PLOF.   If plan is discharge home, recommend the following: A lot of help with walking and/or transfers;A lot of help with bathing/dressing/bathroom;Assist for transportation;Help with stairs or ramp for entrance   Can travel by private vehicle     No  Equipment Recommendations  None recommended by PT    Recommendations for Other Services       Precautions / Restrictions Precautions Precautions: Fall Restrictions Weight Bearing Restrictions: No     Mobility  Bed Mobility               General bed mobility comments: Deferred due to Pt on BSC at beginning of session    Transfers Overall transfer level: Needs assistance Equipment used: Rolling walker (2 wheels) Transfers: Sit to/from Stand Sit to Stand: Min assist           General transfer comment: min A for STS from [facility] and for initial steadiness but able to progress to CGA    Ambulation/Gait Ambulation/Gait assistance: Contact guard assist Gait Distance (Feet): 15  Feet Assistive device: Rolling walker (2 wheels) Gait Pattern/deviations: Step-to pattern, Decreased step length - right, Decreased step length - left, Decreased stride length Gait velocity: decreased     General Gait Details: amb around bed to recliner; min cuing required for AD management   Stairs             Wheelchair Mobility     Tilt Bed    Modified Rankin (Stroke Patients Only)       Balance Overall balance assessment: Needs assistance Sitting-balance support: Feet supported Sitting balance-Leahy Scale: Good Sitting balance - Comments: able to sit unsupported at edge of seat   Standing balance support: During functional activity, Reliant on assistive device for balance, Bilateral upper extremity supported Standing balance-Leahy Scale: Fair Standing balance comment: able to perform pericare in standing with UE support on RW and no overt LOB                            Cognition Arousal: Alert Behavior During Therapy: WFL for tasks assessed/performed                                   General Comments: Pleasant and cooperative with therapy        Exercises Other Exercises Other Exercises: toileting; mod I for peri-care with 1 UE support on RW with no LOB noted  General Comments        Pertinent Vitals/Pain Pain Assessment Pain Assessment: 0-10 Pain Score: 2  Pain Location: R foot and leg Pain Descriptors / Indicators: Discomfort, Tightness, Sore Pain Intervention(s): Monitored during session, Repositioned    Home Living                          Prior Function            PT Goals (current goals can now be found in the care plan section) Acute Rehab PT Goals Patient Stated Goal: Get my strength back PT Goal Formulation: With patient Time For Goal Achievement: 04/16/23 Potential to Achieve Goals: Good Progress towards PT goals: Progressing toward goals    Frequency    Min 1X/week      PT Plan       Co-evaluation              AM-PAC PT "6 Clicks" Mobility   Outcome Measure  Help needed turning from your back to your side while in a flat bed without using bedrails?: A Lot Help needed moving from lying on your back to sitting on the side of a flat bed without using bedrails?: A Lot Help needed moving to and from a bed to a chair (including a wheelchair)?: A Little Help needed standing up from a chair using your arms (e.g., wheelchair or bedside chair)?: A Little Help needed to walk in hospital room?: A Little Help needed climbing 3-5 steps with a railing? : Total 6 Click Score: 14    End of Session Equipment Utilized During Treatment: Oxygen Activity Tolerance: Patient limited by fatigue Patient left: in chair;with call bell/phone within reach;with chair alarm set;with family/visitor present Nurse Communication: Mobility status PT Visit Diagnosis: Unsteadiness on feet (R26.81);Other abnormalities of gait and mobility (R26.89);Muscle weakness (generalized) (M62.81);Difficulty in walking, not elsewhere classified (R26.2);Pain Pain - Right/Left: Right Pain - part of body: Ankle and joints of foot;Leg     Time: 0913-0925 PT Time Calculation (min) (ACUTE ONLY): 12 min  Charges:                            Elmon Else, SPT    Shakenya Stoneberg 04/04/2023, 10:56 AM

## 2023-04-04 NOTE — Assessment & Plan Note (Signed)
Patient has primary hyperparathyroidism.  PTH 99.  Calcium 10.5.  Will consider Cinacalcet.

## 2023-04-04 NOTE — Care Management Important Message (Signed)
Important Message  Patient Details  Name: Ann Silva MRN: [id number] Date of Birth: 1946/05/09   Important Message Given:  Yes - Medicare IM     Olegario Messier A [name] 04/04/2023, 1:47 PM

## 2023-04-04 NOTE — TOC Progression Note (Addendum)
Transition of Care [facility]) - Progression Note    Patient Details  Name: Ann Silva MRN: [id number] Date of Birth: April 22, 1946  Transition of Care [facility]) CM/SW Contact  Erin Sons, [place] Phone Number: 04/04/2023, 12:07 PM  Clinical Narrative:     CSW met with pt to discuss SNF rec. Pt wishes to go home instead of SNF. She states she lives at home with her husband and her sister and has other family that live near by. She is currently active with [facility]. She has a walker at home and is requesting a 3-in-1. Pt states her husband would transport her home. Pt consents to CSW updating husband.   CSW called and left message with pt's husband.   1245: Spoke with husband who confirmed plan for pt to return home with HH.  3 in 1 ordered from [employer]  Expected Discharge Plan: Home w Home Health Services Barriers to Discharge: Continued Medical Work up  Expected Discharge Plan and Services       Living arrangements for the past 2 months: Single Family Home                                       Social Determinants of Health (SDOH) Interventions SDOH Screenings   Food Insecurity: No Food Insecurity (03/31/2023)  Housing: Patient Declined (03/31/2023)  Transportation Needs: No Transportation Needs (03/31/2023)  Utilities: Not At Risk (03/31/2023)  Financial Resource Strain: Low Risk  (03/05/2023)   Received from [facility]  Tobacco Use: Medium Risk (03/31/2023)    Readmission Risk Interventions     No data to display

## 2023-04-04 NOTE — Progress Notes (Signed)
Progress Note   Patient: Ann Silva JXB:[id number] DOB: 14-Jul-1945 DOA: 03/31/2023     3 DOS: the patient was seen and examined on 04/04/2023   Brief hospital course: Taken from H&P.  Ann Silva is a 77 y.o. female with medical history significant of chronic hypoxic respiratory failure on 4 L at baseline, chronic cor pulmonale, recurrent UTI, COPD, colon cancer s/p colectomy, hypertension, lower extremity edema on Lasix, who presents to the ED due to generalized weakness for 4 to 5 days.   Due to gradually worsening weakness throughout, she has been unable to perform ADLs. She also endorses abdominal distention but states this has been occurring for the last 6 weeks at least and does not seem to have acutely worsened but just has not improved yet. She denies any chest pain, palpitations, shortness of breath, cough, nausea, vomiting, diarrhea, abdominal pain. She denies any urinary symptoms at this time.   On presenting to ED she had stable vitals, saturating well on baseline oxygen requirement of 4 L.  Labs pertinent for sodium 133, potassium 2.6, bicarb 29, BUN 30, creatinine 1.58, calcium 11.1.  Troponin and BNP was normal.  Elevated lactic acid at 4.1 which improved to 1.8 with IV fluid.  UA with trace leukocytes and rare bacteria.  Chest x-ray with no acute cardiopulmonary disease.  Patient did receive IV Lasix in ED which was not continued as she appears euvolemic and needing some IV fluid.  9/24: Patient with persistent hypokalemia with normal magnesium, potassium is being repleted. Preliminary blood cultures negative, TSH and CK normal, B12 920.  AKI resolved.  Calcium normalized. PT and OT are recommending SNF. 9/25.  Patient complains of swelling of the lower extremities and difficulty walking secondary to swelling.  Patient interested in going back on her water pills.  Patient interested in going home and not to rehab.  No pain behind her right knee today. 9/26.  Continue IV  Lasix.  Started colchicine and indomethacin for gout 9/27.  Continue IV Lasix.  Continue colchicine twice daily and increased dose of indomethacin for gout    Assessment and Plan: * Bilateral lower extremity edema Continue IV Lasix twice daily.  Patient having too much discomfort for TED hose.  Hypokalemia Potassium in normal range today.  Added spironolactone to try to compensate for low potassium.  Acute gout Right ankle and foot.  Patient declined steroid.  Continue colchicine twice daily and 3 times daily indomethacin.  AKI (acute kidney injury) (HCC) Resolved.  Creatinine holding even with IV diuresis.  Weakness PT recommending rehab but patient would like to go home.  Chronic hypoxic respiratory failure (HCC) Chronically on 4 L of oxygen.  pCO2 55.  COPD (chronic obstructive pulmonary disease) (HCC) Continue home inhalers  Obstructive sleep apnea CPAP at night  Chronic diastolic CHF (congestive heart failure) (HCC) Continue IV Lasix twice daily EF 55%.  [name] is too expensive.  Spironolactone added to compensate for low potassium.  Discussed her outpatient weights along with her weights here in the hospital.  Today's weight is 193.7.  As outpatient dating back to 4/11 she was 190 at the lowest point.  Hypercalcemia Patient has primary hyperparathyroidism.  PTH 99.  Calcium 10.5.  Will consider Cinacalcet.        Subjective: Patient still has pain in her right foot and ankle.  Uric acid elevated consistent with gout.  Patient's weight is 193.7 today.  Patient still has swelling of the lower extremities.  Patient's potassium better today.  Physical Exam: Vitals:   04/04/23 0522 04/04/23 0719 04/04/23 0829 04/04/23 1628  BP: (!) 111/55  118/67   Pulse: 61     Resp: 20  15   Temp: 97.9 F (36.6 C)  98.1 F (36.7 C)   TempSrc: Oral  Oral   SpO2: 97%  97% 98%  Weight:  87.9 kg    Height:       Physical Exam HENT:     Head: Normocephalic.     Mouth/Throat:      Pharynx: No oropharyngeal exudate.  Eyes:     General: Lids are normal.     Conjunctiva/sclera: Conjunctivae normal.  Cardiovascular:     Rate and Rhythm: Normal rate and regular rhythm.     Heart sounds: Normal heart sounds, S1 normal and S2 normal.  Pulmonary:     Breath sounds: Examination of the right-lower field reveals decreased breath sounds. Examination of the left-lower field reveals decreased breath sounds. Decreased breath sounds present. No wheezing, rhonchi or rales.  Abdominal:     Palpations: Abdomen is soft.     Tenderness: There is no abdominal tenderness.  Musculoskeletal:     Right knee: No tenderness.     Right lower leg: Swelling present.     Left lower leg: Swelling present.     Right ankle: Tenderness present.  Skin:    General: Skin is warm.     Findings: No rash.  Neurological:     Mental Status: She is alert and oriented to person, place, and time.     Data Reviewed: PTH intact at 99, creatinine 0.95, calcium 10.5  Family Communication: Spoke with family at bedside  Disposition: Status is: Inpatient Remains inpatient appropriate because: Continue IV Lasix today, continue colchicine twice daily and 3 times daily indomethacin.  Planned Discharge Destination: Home with Home Health    Time spent: 28 minutes  Author: Alford Highland, MD 04/04/2023 4:57 PM  For on call review [email or web address].

## 2023-04-04 NOTE — Progress Notes (Signed)
Occupational Therapy Treatment Patient Details Name: Ann Silva MRN: [id number] DOB: [birth year] Today's Date: 04/04/2023   History of present illness Ann Silva is a 77 y.o. female with medical history significant of chronic hypoxic respiratory failure on 4 L at baseline, chronic cor pulmonale, recurrent UTI, COPD, colon cancer s/p colectomy, hypertension, lower extremity edema on Lasix, who presents to the ED due to generalized weakness for 4 to 5 days.   OT comments  Pt seen for OT tx on 2nd attempt. Pt agreeable, denies complaints. Pt required MIN A for sup>sit EOB and VC for bed rail to improve her independence. Pt/spouse educated in use of bed rail at home to improve bed mobility with their adjustable bed. Pt required CGA for STS and SPT to [facility] with RW and VC for hand placement. Pt demo's tendency to hold her breath during exertional tasks and required moderate VC throughout session to breath. Pt completed pericare in standing with setup and CGA. Noted slight posterior LOB but able to self correct when she attempted to use BUE to pull up briefs over hips. Pt educated in alternating UE on RW to pull up briefs with improvement in balance as a result. Pt tolerated washing hands at the sink with supv. Pt/spouse educated in RW set up while at sink for bathroom and for kitchen layouts to optimize safety and access to environment. Pt/spouse verbalized understanding. Pt progressing towards goals, continues to benefit from skilled OT services to maximize return to PLOF.       If plan is discharge home, recommend the following:  A lot of help with bathing/dressing/bathroom;Assistance with cooking/housework;Direct supervision/assist for medications management;Direct supervision/assist for financial management;Assist for transportation;Help with stairs or ramp for entrance;A little help with walking and/or transfers   Equipment Recommendations  None recommended by OT    Recommendations for Other  Services      Precautions / Restrictions Precautions Precautions: Fall Restrictions Weight Bearing Restrictions: No       Mobility Bed Mobility Overal bed mobility: Needs Assistance Bed Mobility: Supine to Sit, Sit to Supine     Supine to sit: Min assist, HOB elevated, Used rails Sit to supine: Supervision, Contact guard assist        Transfers Overall transfer level: Needs assistance Equipment used: Rolling walker (2 wheels) Transfers: Sit to/from Stand Sit to Stand: Contact guard assist     Step pivot transfers: Contact guard assist     General transfer comment: VC for hand placement and to not hold breath     Balance Overall balance assessment: Needs assistance Sitting-balance support: Feet supported Sitting balance-Leahy Scale: Good   Postural control: Posterior lean Standing balance support: During functional activity, Reliant on assistive device for balance, Single extremity supported, No upper extremity supported Standing balance-Leahy Scale: Poor Standing balance comment: poor when attempting to don briefs with BUE, fair with UE support on RW while using other UE to don briefs over hips and alternating                           ADL either performed or assessed with clinical judgement   ADL Overall ADL's : Needs assistance/impaired     Grooming: Standing;Wash/dry hands;Supervision/safety Grooming Details (indicate cue type and reason): educated in RW set up while at sink for bathroom and for kitchen layouts to optimize safety and access to environment                 Toilet  Transfer: BSC/3in1;Rolling walker (2 wheels);Contact guard assist Toilet Transfer Details (indicate cue type and reason): step pivot Toileting- Clothing Manipulation and Hygiene: Sit to/from stand;Set up Toileting - Clothing Manipulation Details (indicate cue type and reason): pt educated in alternating UE support on RW to complete donning of brief over hips instead  of attempting to use BUE with noted slight LOB with this attempt. Improved balance noted once new strategy implemented            Extremity/Trunk Assessment              Vision       Perception     Praxis      Cognition Arousal: Alert Behavior During Therapy: WFL for tasks assessed/performed Overall Cognitive Status: Within Functional Limits for tasks assessed                                          Exercises      Shoulder Instructions       General Comments VSS    Pertinent Vitals/ Pain       Pain Assessment Pain Assessment: Faces Faces Pain Scale: Hurts a little bit Pain Location: R foot and leg Pain Descriptors / Indicators: Discomfort, Tightness, Sore Pain Intervention(s): Monitored during session, Repositioned  Home Living                                          Prior Functioning/Environment              Frequency  Min 1X/week        Progress Toward Goals  OT Goals(current goals can now be found in the care plan section)  Progress towards OT goals: Progressing toward goals  Acute Rehab OT Goals Patient Stated Goal: go home OT Goal Formulation: With patient Time For Goal Achievement: 04/15/23 Potential to Achieve Goals: Good  Plan      Co-evaluation                 AM-PAC OT "6 Clicks" Daily Activity     Outcome Measure   Help from another person eating meals?: None Help from another person taking care of personal grooming?: None Help from another person toileting, which includes using toliet, bedpan, or urinal?: A Little Help from another person bathing (including washing, rinsing, drying)?: A Lot Help from another person to put on and taking off regular upper body clothing?: A Little Help from another person to put on and taking off regular lower body clothing?: A Lot 6 Click Score: 18    End of Session Equipment Utilized During Treatment: Rolling walker (2 wheels);Oxygen  OT  Visit Diagnosis: Muscle weakness (generalized) (M62.81);Unsteadiness on feet (R26.81);Other abnormalities of gait and mobility (R26.89)   Activity Tolerance Patient tolerated treatment well   Patient Left in bed;with call bell/phone within reach;with bed alarm set;with family/visitor present   Nurse Communication          Time: 1027-2536 OT Time Calculation (min): 18 min  Charges: OT General Charges $OT Visit: 1 Visit OT Treatments $Self Care/Home Management : 8-22 mins  Arman Filter., MPH, MS, OTR/L ascom [phone] 04/04/23, 4:38 PM

## 2023-04-04 NOTE — Progress Notes (Signed)
OT Cancellation Note  Patient Details Name: Ann Silva MRN: [id number] DOB: [birth year]   Cancelled Treatment:    Reason Eval/Treat Not Completed: Patient declined, no reason specified. Upon attempt, pt reporting she just received lunch and declines to get OOB at this time. Will re-attempt as schedule permits.   Arman Filter., MPH, MS, OTR/L ascom [phone] 04/04/23, 2:08 PM

## 2023-04-04 NOTE — Progress Notes (Signed)
Mobility Specialist - Progress Note   04/04/23 1055  Mobility  Activity Transferred from chair to bed (seated therex)  Level of Assistance Contact guard assist, steadying assist  Assistive Device Front wheel walker  Distance Ambulated (ft) 2 ft  Range of Motion/Exercises Active;Right leg;Left leg  Activity Response Tolerated well  Mobility Referral Yes  $Mobility charge 1 Mobility  Mobility Specialist Start Time (ACUTE ONLY) 1028  Mobility Specialist Stop Time (ACUTE ONLY) 1039  Mobility Specialist Time Calculation (min) (ACUTE ONLY) 11 min   Pt sitting in the recliner upon entry, utilizing [place]. Pt completed seated TherEx; marches 30 seconds, Bilat leg kicks x6, adductor pillow squeeze x 10, ankle pumps and circles 20 seconds. Pt STS to RW MinA and transferred to bed via SPT CGA-MinG to bed. Pt left supine with alarm set and needs within reach.  Zetta Bills Mobility Specialist 04/04/23 11:04 AM

## 2023-04-04 NOTE — Progress Notes (Signed)
PHARMACY CONSULT NOTE - ELECTROLYTES  Pharmacy Consult for Electrolyte Monitoring and Replacement   Recent Labs: Height: 4\' 10"  (147.3 cm) Weight: 87.9 kg (193 lb 12.6 oz) IBW/kg (Calculated) : 40.9 Estimated Creatinine Clearance: 46 mL/min (by C-G formula based on SCr of 0.95 mg/dL). Potassium (mmol/L)  Date Value  04/04/2023 4.0  07/09/2014 3.4 (L)   Magnesium (mg/dL)  Date Value  16/04/9603 2.1   Calcium (mg/dL)  Date Value  54/03/8118 10.5 (H)   Calcium, Total (mg/dL)  Date Value  14/78/2956 10.5 (H)   Albumin (g/dL)  Date Value  21/30/8657 3.4 (L)  10/14/2014 3.9   Phosphorus (mg/dL)  Date Value  84/69/6295 2.7   Sodium (mmol/L)  Date Value  04/04/2023 139  01/10/2016 138  07/09/2014 139   Assessment  Ann Silva is a 77 y.o. female presenting with generalized weakness. PMH significant for chronic hypoxic respiratory failure on 4 L at baseline, chronic cor pulmonale, recurrent UTI, COPD, colon cancer s/p colectomy, hypertension, lower extremity edema on Lasix . Pharmacy has been consulted to monitor and replace electrolytes.  Goal of Therapy: Electrolytes WNL  Plan:  No electrolyte replacement warranted for today Check BMP with AM labs  Thank you for involving pharmacy in this patient's care.   Rockwell Alexandria, PharmD Clinical Pharmacist 04/04/2023 8:06 AM

## 2023-04-05 DIAGNOSIS — M10071 Idiopathic gout, right ankle and foot: Secondary | ICD-10-CM | POA: Diagnosis not present

## 2023-04-05 DIAGNOSIS — R600 Localized edema: Secondary | ICD-10-CM | POA: Diagnosis not present

## 2023-04-05 DIAGNOSIS — N179 Acute kidney failure, unspecified: Secondary | ICD-10-CM | POA: Diagnosis not present

## 2023-04-05 DIAGNOSIS — E876 Hypokalemia: Secondary | ICD-10-CM | POA: Diagnosis not present

## 2023-04-05 LAB — BASIC METABOLIC PANEL
Anion gap: 10 (ref 5–15)
BUN: 46 mg/dL — ABNORMAL HIGH (ref 8–23)
CO2: 30 mmol/L (ref 22–32)
Calcium: 10.6 mg/dL — ABNORMAL HIGH (ref 8.9–10.3)
Chloride: 98 mmol/L (ref 98–111)
Creatinine, Ser: 1.27 mg/dL — ABNORMAL HIGH (ref 0.44–1.00)
GFR, Estimated: 43 mL/min — ABNORMAL LOW (ref >60)
Glucose, Bld: 126 mg/dL — ABNORMAL HIGH (ref 70–99)
Potassium: 4.2 mmol/L (ref 3.5–5.1)
Sodium: 138 mmol/L (ref 135–145)

## 2023-04-05 LAB — CULTURE, BLOOD (ROUTINE X 2)
Culture: NO GROWTH
Culture: NO GROWTH

## 2023-04-05 MED ORDER — COLCHICINE 0.6 MG PO TABS
0.6000 mg | ORAL_TABLET | Freq: Every day | ORAL | Status: DC
  Administered 2023-04-06: 0.6 mg via ORAL
  Filled 2023-04-05: qty 1

## 2023-04-05 NOTE — Progress Notes (Signed)
Progress Note   Patient: Ann Silva ZOX:[id number] DOB: 11/18/1945 DOA: 03/31/2023     4 DOS: the patient was seen and examined on 04/05/2023   Brief hospital course: Taken from H&P.  Ann Silva is a 77 y.o. female with medical history significant of chronic hypoxic respiratory failure on 4 L at baseline, chronic cor pulmonale, recurrent UTI, COPD, colon cancer s/p colectomy, hypertension, lower extremity edema on Lasix, who presents to the ED due to generalized weakness for 4 to 5 days.   Due to gradually worsening weakness throughout, she has been unable to perform ADLs. She also endorses abdominal distention but states this has been occurring for the last 6 weeks at least and does not seem to have acutely worsened but just has not improved yet. She denies any chest pain, palpitations, shortness of breath, cough, nausea, vomiting, diarrhea, abdominal pain. She denies any urinary symptoms at this time.   On presenting to ED she had stable vitals, saturating well on baseline oxygen requirement of 4 L.  Labs pertinent for sodium 133, potassium 2.6, bicarb 29, BUN 30, creatinine 1.58, calcium 11.1.  Troponin and BNP was normal.  Elevated lactic acid at 4.1 which improved to 1.8 with IV fluid.  UA with trace leukocytes and rare bacteria.  Chest x-ray with no acute cardiopulmonary disease.  Patient did receive IV Lasix in ED which was not continued as she appears euvolemic and needing some IV fluid.  9/24: Patient with persistent hypokalemia with normal magnesium, potassium is being repleted. Preliminary blood cultures negative, TSH and CK normal, B12 920.  AKI resolved.  Calcium normalized. PT and OT are recommending SNF. 9/25.  Patient complains of swelling of the lower extremities and difficulty walking secondary to swelling.  Patient interested in going back on her water pills.  Patient interested in going home and not to rehab.  No pain behind her right knee today. 9/26.  Continue IV  Lasix.  Started colchicine and indomethacin for gout 9/27.  Continue IV Lasix.  Continue colchicine twice daily and increased dose of indomethacin for gout 9/28.  Creatinine up to 1.27 this morning.  Hold Lasix, spironolactone, potassium and discontinue indomethacin.  Ankle pain is better.    Assessment and Plan: * Bilateral lower extremity edema Hold Lasix today with overdiuresis.  Patient having too much discomfort for TED hose.  Hypokalemia Potassium in normal range today. Holding spironolactone today with acute kidney injury and holding potassium.  Acute gout Improved. Right ankle and foot.  Patient declined steroid.  Continue colchicine daily.  AKI (acute kidney injury) (HCC) Creatinine creeped up to 1.27 today.  Hold Lasix and spironolactone today.  Weakness PT recommending rehab but patient would like to go home.  Chronic hypoxic respiratory failure (HCC) Chronically on 4 L of oxygen.  pCO2 55.  COPD (chronic obstructive pulmonary disease) (HCC) Continue home inhalers  Obstructive sleep apnea CPAP at night  Chronic diastolic CHF (congestive heart failure) (HCC) EF 55%.  [name] is too expensive.  Today's weight is 195.77.  As outpatient dating back to 4/11 she was 190 at the lowest point.  Holding Lasix and spironolactone today with creatinine creeping up.  Check BMP tomorrow.  Hypercalcemia Patient has primary hyperparathyroidism.  PTH 99.  Calcium 10.5.  Will consider Cinacalcet.        Subjective: Patient feeling better with regards to pain in her ankle.  Feels okay.  Breathing okay.  Still has swelling of the lower extremities.  Physical Exam: Vitals:  04/04/23 2007 04/05/23 0500 04/05/23 0524 04/05/23 0916  BP: 95/64  121/69 (!) 113/50  Pulse: (!) 55  (!) 57 62  Resp: 16  20   Temp: 97.6 F (36.4 C)  97.8 F (36.6 C) 97.9 F (36.6 C)  TempSrc:   Oral   SpO2: 100%  100% 100%  Weight:  88.8 kg    Height:       Physical Exam HENT:     Head:  Normocephalic.     Mouth/Throat:     Pharynx: No oropharyngeal exudate.  Eyes:     General: Lids are normal.     Conjunctiva/sclera: Conjunctivae normal.  Cardiovascular:     Rate and Rhythm: Normal rate and regular rhythm.     Heart sounds: Normal heart sounds, S1 normal and S2 normal.  Pulmonary:     Breath sounds: Examination of the right-lower field reveals decreased breath sounds. Examination of the left-lower field reveals decreased breath sounds. Decreased breath sounds present. No wheezing, rhonchi or rales.  Abdominal:     Palpations: Abdomen is soft.     Tenderness: There is no abdominal tenderness.  Musculoskeletal:     Right lower leg: Swelling present.     Left lower leg: Swelling present.     Right ankle: No tenderness.     Comments: Good range of motion right ankle today.  No pain.  Skin:    General: Skin is warm.     Findings: No rash.  Neurological:     Mental Status: She is alert and oriented to person, place, and time.     Data Reviewed: Creatinine creeped up to 1.27 and a BUN of 46  Family Communication: Spoke with daughter on phone  Disposition: Status is: Inpatient Remains inpatient appropriate because: Hold Lasix, spironolactone and discontinue indomethacin.  Planned Discharge Destination: Home with Home Health    Time spent: 27 minutes  Author: Alford Highland, MD 04/05/2023 1:17 PM  For on call review [email or web address].

## 2023-04-05 NOTE — Plan of Care (Signed)

## 2023-04-05 NOTE — Progress Notes (Signed)
PHARMACY CONSULT NOTE - ELECTROLYTES  Pharmacy Consult for Electrolyte Monitoring and Replacement   Recent Labs: Height: 4\' 10"  (147.3 cm) Weight: 87.9 kg (193 lb 12.6 oz) IBW/kg (Calculated) : 40.9 Estimated Creatinine Clearance: 34.4 mL/min (A) (by C-G formula based on SCr of 1.27 mg/dL (H)). Potassium (mmol/L)  Date Value  04/05/2023 4.2  07/09/2014 3.4 (L)   Magnesium (mg/dL)  Date Value  16/04/9603 2.1   Calcium (mg/dL)  Date Value  54/03/8118 10.6 (H)   Calcium, Total (mg/dL)  Date Value  14/78/2956 10.5 (H)   Albumin (g/dL)  Date Value  21/30/8657 3.4 (L)  10/14/2014 3.9   Phosphorus (mg/dL)  Date Value  84/69/6295 2.7   Sodium (mmol/L)  Date Value  04/05/2023 138  01/10/2016 138  07/09/2014 139   Assessment  Ann Silva is a 77 y.o. female presenting with generalized weakness. PMH significant for chronic hypoxic respiratory failure on 4 L at baseline, chronic cor pulmonale, recurrent UTI, COPD, colon cancer s/p colectomy, hypertension, lower extremity edema on Lasix . Pharmacy has been consulted to monitor and replace electrolytes.  Goal of Therapy: Electrolytes WNL  Plan:  Mild hypercalcemia. May be secondary to dehydration. Noted Scr bump / BUN trending up. Continue to monitor Check BMP with AM labs  Thank you for involving pharmacy in this patient's care.   Tressie Ellis 04/05/2023 7:12 AM

## 2023-04-06 DIAGNOSIS — R531 Weakness: Secondary | ICD-10-CM | POA: Diagnosis not present

## 2023-04-06 DIAGNOSIS — R600 Localized edema: Secondary | ICD-10-CM | POA: Diagnosis not present

## 2023-04-06 DIAGNOSIS — M10071 Idiopathic gout, right ankle and foot: Secondary | ICD-10-CM | POA: Diagnosis not present

## 2023-04-06 DIAGNOSIS — E876 Hypokalemia: Secondary | ICD-10-CM | POA: Diagnosis not present

## 2023-04-06 LAB — BASIC METABOLIC PANEL
Anion gap: 8 (ref 5–15)
BUN: 34 mg/dL — ABNORMAL HIGH (ref 8–23)
CO2: 30 mmol/L (ref 22–32)
Calcium: 10.4 mg/dL — ABNORMAL HIGH (ref 8.9–10.3)
Chloride: 101 mmol/L (ref 98–111)
Creatinine, Ser: 1.02 mg/dL — ABNORMAL HIGH (ref 0.44–1.00)
GFR, Estimated: 56 mL/min — ABNORMAL LOW (ref >60)
Glucose, Bld: 105 mg/dL — ABNORMAL HIGH (ref 70–99)
Potassium: 4.1 mmol/L (ref 3.5–5.1)
Sodium: 139 mmol/L (ref 135–145)

## 2023-04-06 MED ORDER — COLCHICINE 0.6 MG PO TABS: 0.6000 mg | ORAL_TABLET | Freq: Every day | ORAL | 0 refills | Status: AC

## 2023-04-06 MED ORDER — VITAMIN D3 25 MCG PO TABS: 2000.0000 [IU] | ORAL_TABLET | Freq: Every day | ORAL | 0 refills | Status: AC

## 2023-04-06 MED ORDER — POTASSIUM CHLORIDE CRYS ER 20 MEQ PO TBCR
20.0000 meq | EXTENDED_RELEASE_TABLET | Freq: Every day | ORAL | Status: DC
  Administered 2023-04-06: 20 meq via ORAL
  Filled 2023-04-06: qty 1

## 2023-04-06 MED ORDER — TORSEMIDE 20 MG PO TABS
40.0000 mg | ORAL_TABLET | Freq: Every day | ORAL | Status: DC
  Administered 2023-04-06: 40 mg via ORAL
  Filled 2023-04-06: qty 2

## 2023-04-06 MED ORDER — TORSEMIDE 20 MG PO TABS: 40.0000 mg | ORAL_TABLET | Freq: Every day | ORAL | 0 refills | Status: DC

## 2023-04-06 MED ORDER — TORSEMIDE 20 MG PO TABS: 20.0000 mg | ORAL_TABLET | Freq: Every day | ORAL | Status: AC

## 2023-04-06 MED ORDER — POLYETHYLENE GLYCOL 3350 17 G PO PACK: 17.0000 g | PACK | Freq: Every day | ORAL | 0 refills | Status: AC | PRN

## 2023-04-06 MED ORDER — METOPROLOL SUCCINATE ER 25 MG PO TB24: 25.0000 mg | ORAL_TABLET | Freq: Every day | ORAL | 0 refills | Status: DC

## 2023-04-06 MED ORDER — METOPROLOL SUCCINATE ER 25 MG PO TB24
25.0000 mg | ORAL_TABLET | Freq: Every day | ORAL | Status: DC
  Administered 2023-04-06: 25 mg via ORAL
  Filled 2023-04-06: qty 1

## 2023-04-06 MED ORDER — SPIRONOLACTONE 25 MG PO TABS
25.0000 mg | ORAL_TABLET | Freq: Every day | ORAL | Status: DC
  Administered 2023-04-06: 25 mg via ORAL
  Filled 2023-04-06: qty 1

## 2023-04-06 MED ORDER — ALLOPURINOL 100 MG PO TABS: 100.0000 mg | ORAL_TABLET | Freq: Every day | ORAL | 0 refills | Status: AC

## 2023-04-06 NOTE — Progress Notes (Addendum)
PHARMACY CONSULT NOTE - ELECTROLYTES  Pharmacy Consult for Electrolyte Monitoring and Replacement   Recent Labs: Height: 4\' 10"  (147.3 cm) Weight: 87.7 kg (193 lb 5.5 oz) IBW/kg (Calculated) : 40.9 Estimated Creatinine Clearance: 42.8 mL/min (A) (by C-G formula based on SCr of 1.02 mg/dL (H)). Potassium (mmol/L)  Date Value  04/06/2023 4.1  07/09/2014 3.4 (L)   Magnesium (mg/dL)  Date Value  16/04/9603 2.1   Calcium (mg/dL)  Date Value  54/03/8118 10.4 (H)   Calcium, Total (mg/dL)  Date Value  14/78/2956 10.5 (H)   Albumin (g/dL)  Date Value  21/30/8657 3.4 (L)  10/14/2014 3.9   Phosphorus (mg/dL)  Date Value  84/69/6295 2.7   Sodium (mmol/L)  Date Value  04/06/2023 139  01/10/2016 138  07/09/2014 139   Assessment  Ann Silva is a 77 y.o. female presenting with generalized weakness. PMH significant for chronic hypoxic respiratory failure on 4 L at baseline, chronic cor pulmonale, recurrent UTI, COPD, colon cancer s/p colectomy, hypertension, lower extremity edema on Lasix . Pharmacy has been consulted to monitor and replace electrolytes.  Diuresis: Torsemide & Aldactone Misc: Kcl 20 mEq PO daily  Goal of Therapy: Electrolytes WNL  Plan:  Continued mild hypercalcemia secondary to hyperparathyroidism. Keep eye on phosphorous BMP, magnesium, phosphorous tomorrow AM  Thank you for involving pharmacy in this patient's care.   Tressie Ellis 04/06/2023 8:02 AM

## 2023-04-06 NOTE — Discharge Instructions (Addendum)
Weight yourself daily.  If you gain 3 lbs in a day or 5 lbs in a week can take and extra 20mg  of torsemide.  I Change your torsemide dose to 20 mg po daily.  (Script in pharmacy is for two tabs but just take one daily) (Can take an extra torsemide 20mg  if you gain 3lbs in a day or 5 lbs in a week)

## 2023-04-06 NOTE — Discharge Summary (Signed)
Physician Discharge Summary   Patient: Ann Silva MRN: [id number] DOB: 1946-06-13  Admit date:     03/31/2023  Discharge date: 04/06/23  Discharge Physician: Alford Highland   PCP: Danella Penton, MD   Recommendations at discharge:   Follow-up PCP 5 days Follow-up with CHF clinic  Discharge Diagnoses: Principal Problem:   Bilateral lower extremity edema Active Problems:   Hypokalemia   Acute gout   AKI (acute kidney injury) (HCC)   Weakness   Chronic hypoxic respiratory failure (HCC)   COPD (chronic obstructive pulmonary disease) (HCC)   Obstructive sleep apnea   Hypercalcemia   Chronic diastolic CHF (congestive heart failure) [facility])   Hospital Course: Taken from H&P.  Ann Silva is a 77 y.o. female with medical history significant of chronic hypoxic respiratory failure on 4 L at baseline, chronic cor pulmonale, recurrent UTI, COPD, colon cancer s/p colectomy, hypertension, lower extremity edema on Lasix, who presents to the ED due to generalized weakness for 4 to 5 days.   Due to gradually worsening weakness throughout, she has been unable to perform ADLs. She also endorses abdominal distention but states this has been occurring for the last 6 weeks at least and does not seem to have acutely worsened but just has not improved yet. She denies any chest pain, palpitations, shortness of breath, cough, nausea, vomiting, diarrhea, abdominal pain. She denies any urinary symptoms at this time.   On presenting to ED she had stable vitals, saturating well on baseline oxygen requirement of 4 L.  Labs pertinent for sodium 133, potassium 2.6, bicarb 29, BUN 30, creatinine 1.58, calcium 11.1.  Troponin and BNP was normal.  Elevated lactic acid at 4.1 which improved to 1.8 with IV fluid.  UA with trace leukocytes and rare bacteria.  Chest x-ray with no acute cardiopulmonary disease.  Patient did receive IV Lasix in ED which was not continued as she appears euvolemic and needing some  IV fluid.  9/24: Patient with persistent hypokalemia with normal magnesium, potassium is being repleted. Preliminary blood cultures negative, TSH and CK normal, B12 920.  AKI resolved.  Calcium normalized. PT and OT are recommending SNF. 9/25.  Patient complains of swelling of the lower extremities and difficulty walking secondary to swelling.  Patient interested in going back on her water pills.  Patient interested in going home and not to rehab.  No pain behind her right knee today. 9/26.  Continue IV Lasix.  Started colchicine and indomethacin for gout 9/27.  Continue IV Lasix.  Continue colchicine twice daily and increased dose of indomethacin for gout 9/28.  Creatinine up to 1.27 this morning.  Hold Lasix, spironolactone, potassium and discontinue indomethacin.  Ankle pain is better. 9/29.  Creatinine improved down to 1.02.  Will start torsemide instead of Lasix, restart spironolactone.    Assessment and Plan: * Bilateral lower extremity edema Lasix held yesterday with overdiuresis.  Restarted torsemide today.  Will give torsemide 20 mg daily.  Weigh patient on a daily basis and if he gains 3 pounds in a day or 5 pounds in a week can take an extra torsemide.  Hypokalemia Replaced during the hospital course.  Zaroxolyn likely playing a role.  Will send home on torsemide and spironolactone to compensate.  Acute gout Improved. Right ankle and foot.  Patient declined steroid.  Continue colchicine daily.  Will add allopurinol low-dose in a few days.  AKI (acute kidney injury) (HCC) Creatinine peaked at 1.29 with IV diuresis.  Creatinine down to  1.02 today.  Okay to restart oral torsemide.  Weakness PT recommending rehab but patient would like to go home.  Chronic hypoxic respiratory failure (HCC) Chronically on 4 L of oxygen.  pCO2 55.  COPD (chronic obstructive pulmonary disease) (HCC) Continue home inhalers  Obstructive sleep apnea CPAP at night  Chronic diastolic CHF  (congestive heart failure) (HCC) EF 55%.  Ann Silva is too expensive.  Today's weight is 193.34.  As outpatient dating back to 4/11 she was 190 at the lowest point.  Continue low-dose Toprol, spironolactone and 20 mg of torsemide upon discharge.  Will refer to CHF clinic.  If she gains 3 pounds in a day or 5 pounds in a week can take an extra dose of torsemide.  Recommend checking a BMP in follow-up appointment.  May need to be seen on a weekly basis or every other week as outpatient  Hypercalcemia Patient has primary hyperparathyroidism.  PTH 99.  Calcium 10.4.  Can consider Cinacalcet as outpatient.  Probably not the best surgical candidate.         Consultants: None Procedures performed: None Disposition: Home health Diet recommendation:  Cardiac diet DISCHARGE MEDICATION: Allergies as of 04/06/2023       Reactions   Hydrocodone-chlorpheniramine Shortness Of Breath   Sulfa Antibiotics Shortness Of Breath   Sulfa Antibiotics Shortness Of Breath   Chlorhexidine Rash   Methadone Other (See Comments)   Altered Mental Status   Benadryl [diphenhydramine] Anxiety   Latex Rash   Tape Rash   "the white kind they use after surgery"        Medication List     STOP taking these medications    ciprofloxacin 250 MG tablet Commonly known as: CIPRO   furosemide 40 MG tablet Commonly known as: Lasix   metolazone 2.5 MG tablet Commonly known as: ZAROXOLYN   Potassium Chloride ER 20 MEQ Tbcr   rOPINIRole 3 MG tablet Commonly known as: REQUIP       TAKE these medications    acetaminophen 500 MG tablet Commonly known as: TYLENOL Take 1,000 mg by mouth every 6 (six) hours as needed for moderate pain or headache.   albuterol 108 (90 Base) MCG/ACT inhaler Commonly known as: VENTOLIN HFA Inhale 2 puffs into the lungs every 4 (four) hours as needed for wheezing or shortness of breath.   allopurinol 100 MG tablet Commonly known as: Zyloprim Take 1 tablet (100 mg total) by  mouth daily. Start taking on: April 09, 2023   apixaban 5 MG Tabs tablet Commonly known as: ELIQUIS Take 5 mg by mouth 2 (two) times daily.   ARIPiprazole 5 MG tablet Commonly known as: ABILIFY Take 5 mg by mouth at bedtime.   buPROPion 75 MG tablet Commonly known as: WELLBUTRIN Take 75 mg by mouth 2 (two) times daily. Unsure of dose   colchicine 0.6 MG tablet Take 1 tablet (0.6 mg total) by mouth daily.   fluticasone-salmeterol 250-50 MCG/ACT Aepb Commonly known as: ADVAIR Inhale 1 puff into the lungs in the morning and at bedtime.   metoprolol succinate 25 MG 24 hr tablet Commonly known as: TOPROL-XL Take 1 tablet (25 mg total) by mouth daily. What changed:  medication strength how much to take   montelukast 10 MG tablet Commonly known as: SINGULAIR Take 10 mg by mouth daily.   omeprazole 40 MG capsule Commonly known as: PRILOSEC Take 40 mg by mouth daily.   oxybutynin 5 MG tablet Commonly known as: DITROPAN Take 5 mg by mouth  2 (two) times daily.   PARoxetine 40 MG tablet Commonly known as: PAXIL Take 40 mg by mouth daily at 2 PM.   polyethylene glycol 17 g packet Commonly known as: MIRALAX / GLYCOLAX Take 17 g by mouth daily as needed for mild constipation.   pramipexole 1 MG tablet Commonly known as: MIRAPEX Take 1 mg by mouth 3 (three) times daily.   spironolactone 25 MG tablet Commonly known as: ALDACTONE Take 25 mg by mouth daily.   temazepam 30 MG capsule Commonly known as: RESTORIL Take 30 mg by mouth at bedtime as needed.   tiotropium 18 MCG inhalation capsule Commonly known as: SPIRIVA Place 18 mcg into inhaler and inhale daily.   torsemide 20 MG tablet Commonly known as: DEMADEX Take 1 tablet (20 mg total) by mouth daily.   vitamin D3 25 MCG tablet Commonly known as: CHOLECALCIFEROL Take 2 tablets (2,000 Units total) by mouth daily.               Durable Medical Equipment  (From admission, onward)           Start      Ordered   04/04/23 1213  For home use only DME 3 n 1  Once        04/04/23 1212            Follow-up Information     Danella Penton, MD Follow up in 5 day(s).   Specialty: Internal Medicine Contact information: [address] [facility] [place] [address] [phone]                Discharge Exam: Filed Weights   04/04/23 0719 04/05/23 0500 04/06/23 0223  Weight: 87.9 kg 88.8 kg 87.7 kg   Physical Exam HENT:     Head: Normocephalic.     Mouth/Throat:     Pharynx: No oropharyngeal exudate.  Eyes:     General: Lids are normal.     Conjunctiva/sclera: Conjunctivae normal.  Cardiovascular:     Rate and Rhythm: Normal rate and regular rhythm.     Heart sounds: Normal heart sounds, S1 normal and S2 normal.  Pulmonary:     Breath sounds: Examination of the right-lower field reveals decreased breath sounds. Examination of the left-lower field reveals decreased breath sounds. Decreased breath sounds present. No wheezing, rhonchi or rales.  Abdominal:     Palpations: Abdomen is soft.     Tenderness: There is no abdominal tenderness.  Musculoskeletal:     Right lower leg: Swelling present.     Left lower leg: Swelling present.     Right ankle: No tenderness.     Comments: Good range of motion right ankle today.  No pain.  Skin:    General: Skin is warm.     Findings: No rash.  Neurological:     Mental Status: She is alert and oriented to person, place, and time.      Condition at discharge: stable  The results of significant diagnostics from this hospitalization (including imaging, microbiology, ancillary and laboratory) are listed below for reference.   Imaging Studies: DG Chest Portable 1 View  Result Date: 03/31/2023 CLINICAL DATA:  weakness EXAM: PORTABLE CHEST - 1 VIEW COMPARISON:  02/22/2023 FINDINGS: Cardiomediastinal silhouette and pulmonary vasculature are within normal limits. Lungs are clear. Severe degenerative  changes of the shoulders. IMPRESSION: No acute cardiopulmonary process. Electronically Signed   By: Acquanetta Belling M.D.   On: 03/31/2023 09:37    Microbiology:  Results for orders placed or performed during the hospital encounter of 03/31/23  Blood culture (routine x 2)     Status: None   Collection Time: 03/31/23  7:59 AM   Specimen: BLOOD  Result Value Ref Range Status   Specimen Description BLOOD BLOOD LEFT ARM  Final   Special Requests   Final    BOTTLES DRAWN AEROBIC AND ANAEROBIC Blood Culture results may not be optimal due to an excessive volume of blood received in culture bottles   Culture   Final    NO GROWTH 5 DAYS Performed at [facility], [address], [place], [place] [address]    Report Status 04/05/2023 FINAL  Final  SARS Coronavirus 2 by RT PCR (hospital order, performed in [facility] hospital lab) *cepheid single result test* Anterior Nasal Swab     Status: None   Collection Time: 03/31/23  8:15 PM   Specimen: Anterior Nasal Swab  Result Value Ref Range Status   SARS Coronavirus 2 by RT PCR NEGATIVE NEGATIVE Final    Comment: (NOTE) SARS-CoV-2 target nucleic acids are NOT DETECTED.  The SARS-CoV-2 RNA is generally detectable in upper and lower respiratory specimens during the acute phase of infection. The lowest concentration of SARS-CoV-2 viral copies this assay can detect is 250 copies / mL. A negative result does not preclude SARS-CoV-2 infection and should not be used as the sole basis for treatment or other patient management decisions.  A negative result may occur with improper specimen collection / handling, submission of specimen other than nasopharyngeal swab, presence of viral mutation(s) within the areas targeted by this assay, and inadequate number of viral copies (<250 copies / mL). A negative result must be combined with clinical observations, patient history, and epidemiological information.  Fact Sheet for Patients:    RoadLapTop.co.za  Fact Sheet for Healthcare Providers: http://kim-miller.com/  This test is not yet approved or  cleared by the [place] FDA and has been authorized for detection and/or diagnosis of SARS-CoV-2 by FDA under an Emergency Use Authorization (EUA).  This EUA will remain in effect (meaning this test can be used) for the duration of the COVID-19 declaration under Section 564(b)(1) of the Act, 21 U.S.C. section 360bbb-3(b)(1), unless the authorization is terminated or revoked sooner.  Performed at [facility], [address] [place], [place] [address]   Blood culture (routine x 2)     Status: None   Collection Time: 03/31/23  9:14 PM   Specimen: BLOOD  Result Value Ref Range Status   Specimen Description BLOOD  L WRIST  Final   Special Requests   Final    BOTTLES DRAWN AEROBIC AND ANAEROBIC Blood Culture adequate volume   Culture   Final    NO GROWTH 5 DAYS Performed at [facility], [address] [place], [place] [address]    Report Status 04/05/2023 FINAL  Final    Labs: CBC: Recent Labs  Lab 03/31/23 0759 04/01/23 0424  WBC 9.0 7.2  NEUTROABS 7.4  --   HGB 12.9 12.5  HCT 39.8 39.5  MCV 92.1 94.3  PLT 480* 454*   Basic Metabolic Panel: Recent Labs  Lab 03/31/23 0957 04/01/23 0424 04/01/23 1446 04/02/23 0637 04/03/23 0541 04/03/23 1646 04/04/23 0725 04/05/23 0307 04/06/23 0500  NA  --  135  --  136 137  --  139 138 139  K  --  2.6*   < > 2.9* 2.6* 3.6 4.0 4.2 4.1  CL  --  91*  --  94* 90*  --  97* 98 101  CO2  --  30  --  31 33*  --  32 30 30  GLUCOSE  --  110*  --  117* 127*  --  115* 126* 105*  BUN  --  21  --  22 27*  --  39* 46* 34*  CREATININE  --  0.95  --  0.92 0.91  --  0.95 1.27* 1.02*  CALCIUM  --  10.3  --  10.6* 11.0*  --  10.5* 10.6* 10.4*  MG 2.3 2.5*  --  2.0 1.7  --  2.1  --   --   PHOS  --  2.7  --   --   --   --   --   --   --    < > = values in this  interval not displayed.   Liver Function Tests: Recent Labs  Lab 03/31/23 0759  AST 34  ALT 20  ALKPHOS 107  BILITOT 0.9  PROT 8.6*  ALBUMIN 3.4*   CBG: No results for input(s): "GLUCAP" in the last 168 hours.  Discharge time spent: greater than 30 minutes.  Signed: Alford Highland, MD [facility] 04/06/2023

## 2023-04-06 NOTE — TOC Transition Note (Signed)
Transition of Care [facility]) - CM/SW Discharge Note   Patient Details  Name: Ann Silva MRN: [id number] Date of Birth: Mar 15, 1946  Transition of [facility]) CM/SW Contact:  Bing Quarry, RN Phone Number: 04/06/2023, 11:40 AM   Clinical Narrative: 9/29: Patient to discharge to home with resumption of active [facility] with [facility] after declining STR/SNF. Messaged Kerrie Buffalo., on call weekend rep for [facility], to notify of discharge and resumption orders in chart.     Per CM note on 04/04/23: "She states she lives at home with her husband and her sister and has other family that live near by. She is currently active with [facility]. She has a walker at home and is requesting a 3-in-1. Pt states her husband would transport her home".   Ann Cirri MSN RN CM  Transitions of Care Department [facility] [phone] Weekends Only       Final next level of care: Home w Home Health Services Barriers to Discharge: Continued Medical Work up   Patient Goals and CMS Choice      Discharge Placement                         Discharge Plan and Services Additional resources added to the After Visit Summary for                  DME Arranged: N/A DME Agency: NA       HH Arranged: PT, OT (Resumption via [facility]) HH Agency: [facility] Date [facility] Agency Contacted:  (Per prior CM note patient active with [facility]. HH orders in for discharge. Secure messaged Rhonda Sessions Weekend on call rep.) Time HH Agency Contacted: [phone] Representative spoke with at [facility] Agency: Bjorn Loser (messaged discharge notification)  Social Determinants of Health (SDOH) Interventions SDOH Screenings   Food Insecurity: No Food Insecurity (03/31/2023)  Housing: Patient Declined (03/31/2023)  Transportation Needs: No Transportation Needs (03/31/2023)  Utilities: Not At Risk (03/31/2023)  Financial Resource Strain: Low Risk  (03/05/2023)   Received from [facility]  Tobacco Use:  Medium Risk (03/31/2023)     Readmission Risk Interventions     No data to display

## 2023-04-06 NOTE — Progress Notes (Signed)
Pt refuses oxygen for transport home

## 2023-04-28 ENCOUNTER — Encounter: Payer: Medicare PPO | Admitting: Cardiology

## 2023-04-30 ENCOUNTER — Ambulatory Visit: Payer: Medicare PPO | Attending: Cardiology | Admitting: Cardiology

## 2023-04-30 VITALS — BP 128/71 | HR 78 | Wt 189.0 lb

## 2023-04-30 DIAGNOSIS — N390 Urinary tract infection, site not specified: Secondary | ICD-10-CM

## 2023-04-30 DIAGNOSIS — J9611 Chronic respiratory failure with hypoxia: Secondary | ICD-10-CM | POA: Diagnosis not present

## 2023-04-30 DIAGNOSIS — I5032 Chronic diastolic (congestive) heart failure: Secondary | ICD-10-CM | POA: Diagnosis not present

## 2023-04-30 DIAGNOSIS — J449 Chronic obstructive pulmonary disease, unspecified: Secondary | ICD-10-CM

## 2023-04-30 DIAGNOSIS — I2782 Chronic pulmonary embolism: Secondary | ICD-10-CM

## 2023-04-30 NOTE — Patient Instructions (Signed)
STOP Toprol   Labs in 1 week  Follow up with general cardiology

## 2023-04-30 NOTE — Progress Notes (Signed)
ADVANCED HEART FAILURE CLINIC NOTE  Referring Physician: Danella Penton, MD  Primary Care: Danella Penton, MD Primary Cardiologist:  HPI: Ann Silva is a 77 y.o. female with HFpEF, chronic hypoxic respiratory failure on 4 L nasal cannula baseline, pulmonary embolism (11/23) status post thrombectomy, recurrent UTIs, COPD, colon cancer status post colectomy, hypertension presenting today to establish care.  She was discharged from [facility] on April 06, 2023 after presenting with weakness and inability to perform ADLs.  Initial workup was significant for lactic acid of 4 and potassium of 2.6; lactic acid improved with IV fluid administration.  She was eventually discharged home on diuretics.  Prior to that she had an echocardiogram in August 2024 with EF of 55 to 60% and normal RV function.  Over the past year she has had 4 admissions to [facility].  In November 2023 she was admitted with acute PE status post mechanical thrombectomy by vascular surgery.  In February 2024 she was admitted for fevers and altered mental status believed to be secondary to sepsis from UTI due to E. coli.  In August 2024 she was admitted with HFpEF exacerbation.  Today she reports, doing fairly well from a volume standpoint. She has minimal dyspnea outside of her baseline. Lower extremity edema is fairly well controlled. No reported PND or orthopnea. She has been taking metolazone PRN but was asked to stop doing this due to a rise in her sCr yesterday. From a functional standpoint, she reports being able to perform all ADLs independently. She spends much of her time on her recliner. Has home PT. Otherwise not very active.    Past Medical History:  Diagnosis Date   Anemia    takes ferrous sulfate daily   Anxiety    Arthritis    Bladder spasm    Chronic back pain    spinal stenosis   Colon cancer (HCC)    Complication of anesthesia    B/P bottoms out, hard to wake up    Contact dermatitis    COPD (chronic  obstructive pulmonary disease) (HCC)    Albuterol inhaler and DuoNeb as needed   COPD (chronic obstructive pulmonary disease) (HCC)    Coronary artery disease    mild by 08/25/09 cath   Foot drop, left    Headache    Heart murmur    never had any problems   History of blood transfusion    no abnormal reaction noted   History of bronchitis    History of MRSA infection 6+ yrs ago   Hypertension    takes Metoprolol daily   Hypertension    Insomnia    takes Trazodone nightly   Joint pain    Joint swelling    Lower extremity edema    Nocturia    Panic attacks    takes Effexor daily   Peripheral edema    takes Lasix daily and takes Bumex every 3 days.Takes Aldactone daily    Pulmonary emboli (HCC) 2018   Restless leg syndrome    takes Requip daily   RLS (restless legs syndrome)    Sleep apnea    doesnt use CPAP    Current Outpatient Medications  Medication Sig Dispense Refill   acetaminophen (TYLENOL) 500 MG tablet Take 1,000 mg by mouth every 6 (six) hours as needed for moderate pain or headache.     albuterol (PROVENTIL HFA;VENTOLIN HFA) 108 (90 Base) MCG/ACT inhaler Inhale 2 puffs into the lungs every 4 (four) hours as needed for  wheezing or shortness of breath. 1 Inhaler 0   allopurinol (ZYLOPRIM) 100 MG tablet Take 1 tablet (100 mg total) by mouth daily. 30 tablet 0   apixaban (ELIQUIS) 5 MG TABS tablet Take 5 mg by mouth 2 (two) times daily.     ARIPiprazole (ABILIFY) 5 MG tablet Take 5 mg by mouth at bedtime.     buPROPion (WELLBUTRIN) 75 MG tablet Take 75 mg by mouth 2 (two) times daily. Unsure of dose     cholecalciferol (CHOLECALCIFEROL) 25 MCG tablet Take 2 tablets (2,000 Units total) by mouth daily. 30 tablet 0   colchicine 0.6 MG tablet Take 1 tablet (0.6 mg total) by mouth daily. 30 tablet 0   fluticasone-salmeterol (ADVAIR) 250-50 MCG/ACT AEPB Inhale 1 puff into the lungs in the morning and at bedtime.     metoprolol succinate (TOPROL-XL) 25 MG 24 hr tablet Take 1  tablet (25 mg total) by mouth daily. 30 tablet 0   montelukast (SINGULAIR) 10 MG tablet Take 10 mg by mouth daily.     omeprazole (PRILOSEC) 40 MG capsule Take 40 mg by mouth daily.     oxybutynin (DITROPAN) 5 MG tablet Take 5 mg by mouth 2 (two) times daily.     PARoxetine (PAXIL) 40 MG tablet Take 40 mg by mouth daily at 2 PM.      polyethylene glycol (MIRALAX / GLYCOLAX) 17 g packet Take 17 g by mouth daily as needed for mild constipation. 14 each 0   pramipexole (MIRAPEX) 1 MG tablet Take 1 mg by mouth 3 (three) times daily.     spironolactone (ALDACTONE) 25 MG tablet Take 25 mg by mouth daily.     temazepam (RESTORIL) 30 MG capsule Take 30 mg by mouth at bedtime as needed.     tiotropium (SPIRIVA) 18 MCG inhalation capsule Place 18 mcg into inhaler and inhale daily.     torsemide (DEMADEX) 20 MG tablet Take 1 tablet (20 mg total) by mouth daily.     No current facility-administered medications for this visit.    Allergies  Allergen Reactions   Hydrocodone-Chlorpheniramine Shortness Of Breath   Sulfa Antibiotics Shortness Of Breath   Sulfa Antibiotics Shortness Of Breath   Chlorhexidine Rash   Methadone Other (See Comments)    Altered Mental Status   Benadryl [Diphenhydramine] Anxiety   Latex Rash   Tape Rash    "the white kind they use after surgery"      Social History   Socioeconomic History   Marital status: Married    Spouse name: Not on file   Number of children: Not on file   Years of education: Not on file   Highest education level: Not on file  Occupational History   Not on file  Tobacco Use   Smoking status: Former    Types: Cigarettes   Smokeless tobacco: Never  Vaping Use   Vaping status: Never Used  Substance and Sexual Activity   Alcohol use: No   Drug use: No   Sexual activity: Not on file  Other Topics Concern   Not on file  Social History Narrative   ** Merged History Encounter **       Social Determinants of Health   Financial Resource  Strain: Low Risk  (04/28/2023)   Received from [facility]   Overall Financial Resource Strain (CARDIA)    Difficulty of Paying Living Expenses: Not hard at all  Food Insecurity: No Food Insecurity (04/28/2023)   Received from  [facility]   Hunger Vital Sign    Worried About Running Out of Food in the Last Year: Never true    Ran Out of Food in the Last Year: Never true  Transportation Needs: No Transportation Needs (04/28/2023)   Received from [facility]    In the past 12 months, has lack of transportation kept you from medical appointments or from getting medications?: No    Lack of Transportation (Non-Medical): No  Physical Activity: Not on file  Stress: Not on file  Social Connections: Not on file  Intimate Partner Violence: Not At Risk (03/31/2023)   Humiliation, Afraid, Rape, and Kick questionnaire    Fear of Current or Ex-Partner: No    Emotionally Abused: No    Physically Abused: No    Sexually Abused: No      Family History  Problem Relation Age of Onset   Cancer Mother    Stroke Father    Diabetes Sister    Mental illness Sister     PHYSICAL EXAM: Vitals:   04/30/23 1501 04/30/23 1506  Pulse: 80 78  SpO2: (!) 89% 91%   GENERAL: chronically ill appearing F in NAD on O2 HEENT: Negative for arcus senilis or xanthelasma. There is no scleral icterus.  The mucous membranes are pink and moist.   NECK: Supple, No masses. Normal carotid upstrokes without bruits. No masses or thyromegaly.    CHEST: There are no chest wall deformities. There is no chest wall tenderness. Respirations are unlabored.  Lungs- coarse lungs CARDIAC:  JVP: difficult to visualize,  cm H2O         Normal S1, S2  Normal rate with regular rhythm. No murmurs, rubs or gallops.  Pulses are 2+ and symmetrical in upper and lower extremities. No edema.  ABDOMEN: Soft, non-tender, non-distended. There are no masses or  hepatomegaly. There are normal bowel sounds.  EXTREMITIES: Warm and well perfused with no cyanosis, clubbing.  LYMPHATIC: No axillary or supraclavicular lymphadenopathy.  NEUROLOGIC: Patient is oriented x3 with no focal or lateralizing neurologic deficits.  PSYCH: Patients affect is appropriate, there is no evidence of anxiety or depression.  SKIN: Warm and dry; no lesions or wounds.   DATA REVIEW  ECG: 03/31/23: NSR w/ RBBB  as per my personal interpretation  ECHO: 02/24/23: LVEF 60%, normal RV function, grade I DD as per my personal interpretation 05/20/22: LVEF 50-55%, normal RV function  ASSESSMENT & PLAN:  Heart failure with preserved ejection fraction - Torsemide 20mg  daily - repeat BMP/BNP in 1 week; rise in sCr to 1.7 on labs from last month.  - Will plan to start spironolactone if sCr allows with repeat labs in 1 week.  - D/C metoprolol.  Pulmonary embolism - No signs of RVF by TTE - continue apixaban 5mg  BID - Not a candidate for PH therapy; TTE with very low suspicion for underlying PH; no RV strain noted. Continue anticoagulation COPD/chronic hypoxic respiratory failure on home O2 - Proventil inhaler ha sneeded; singulari daily.  - stable today Recurrent urinary tract infections - See HPI above - No complaints today - Not a candidate for SGLT2i Chronic kidney disease - Repeat BMP/BNP pending  I spent 45 minutes caring for this patient today including face to face time, ordering and reviewing labs, reviewing records from her hospitalizations, discussing medication/diuretic titration, seeing the patient, documenting in the record, and arranging follow ups.   Anthonee Gelin Advanced Heart Failure Mechanical Circulatory  Support

## 2023-05-08 ENCOUNTER — Other Ambulatory Visit
Admission: RE | Admit: 2023-05-08 | Discharge: 2023-05-08 | Disposition: A | Discharge status: Home / Self Care | Payer: Medicare PPO | Attending: Cardiology | Admitting: Cardiology

## 2023-05-08 DIAGNOSIS — I5032 Chronic diastolic (congestive) heart failure: Secondary | ICD-10-CM | POA: Diagnosis present

## 2023-05-08 LAB — BASIC METABOLIC PANEL
Anion gap: 16 — ABNORMAL HIGH (ref 5–15)
BUN: 25 mg/dL — ABNORMAL HIGH (ref 8–23)
CO2: 27 mmol/L (ref 22–32)
Calcium: 10.8 mg/dL — ABNORMAL HIGH (ref 8.9–10.3)
Chloride: 92 mmol/L — ABNORMAL LOW (ref 98–111)
Creatinine, Ser: 1.48 mg/dL — ABNORMAL HIGH (ref 0.44–1.00)
GFR, Estimated: 36 mL/min — ABNORMAL LOW (ref >60)
Glucose, Bld: 135 mg/dL — ABNORMAL HIGH (ref 70–99)
Potassium: 2.9 mmol/L — ABNORMAL LOW (ref 3.5–5.1)
Sodium: 135 mmol/L (ref 135–145)

## 2023-05-08 LAB — BRAIN NATRIURETIC PEPTIDE: B Natriuretic Peptide: 35.6 pg/mL (ref 0.0–100.0)

## 2023-06-24 ENCOUNTER — Ambulatory Visit (INDEPENDENT_AMBULATORY_CARE_PROVIDER_SITE_OTHER): Payer: Medicare Other | Admitting: Vascular Surgery

## 2023-08-04 DIAGNOSIS — D6869 Other thrombophilia: Secondary | ICD-10-CM | POA: Diagnosis not present

## 2023-08-04 DIAGNOSIS — E538 Deficiency of other specified B group vitamins: Secondary | ICD-10-CM | POA: Diagnosis not present

## 2023-08-04 DIAGNOSIS — E210 Primary hyperparathyroidism: Secondary | ICD-10-CM | POA: Diagnosis not present

## 2023-08-06 DIAGNOSIS — I5030 Unspecified diastolic (congestive) heart failure: Secondary | ICD-10-CM | POA: Diagnosis not present

## 2023-08-06 DIAGNOSIS — E210 Primary hyperparathyroidism: Secondary | ICD-10-CM | POA: Diagnosis not present

## 2023-08-06 DIAGNOSIS — N183 Chronic kidney disease, stage 3 unspecified: Secondary | ICD-10-CM | POA: Diagnosis not present

## 2023-08-06 DIAGNOSIS — J449 Chronic obstructive pulmonary disease, unspecified: Secondary | ICD-10-CM | POA: Diagnosis not present

## 2023-08-06 DIAGNOSIS — J9611 Chronic respiratory failure with hypoxia: Secondary | ICD-10-CM | POA: Diagnosis not present

## 2023-08-06 DIAGNOSIS — F319 Bipolar disorder, unspecified: Secondary | ICD-10-CM | POA: Diagnosis not present

## 2023-08-21 DIAGNOSIS — I272 Pulmonary hypertension, unspecified: Secondary | ICD-10-CM | POA: Diagnosis not present

## 2023-08-21 DIAGNOSIS — Z9981 Dependence on supplemental oxygen: Secondary | ICD-10-CM | POA: Diagnosis not present

## 2023-08-21 DIAGNOSIS — J449 Chronic obstructive pulmonary disease, unspecified: Secondary | ICD-10-CM | POA: Diagnosis not present

## 2023-08-26 DIAGNOSIS — R300 Dysuria: Secondary | ICD-10-CM | POA: Diagnosis not present

## 2023-08-26 DIAGNOSIS — Z03818 Encounter for observation for suspected exposure to other biological agents ruled out: Secondary | ICD-10-CM | POA: Diagnosis not present

## 2023-08-26 DIAGNOSIS — R6889 Other general symptoms and signs: Secondary | ICD-10-CM | POA: Diagnosis not present

## 2023-09-01 ENCOUNTER — Other Ambulatory Visit: Payer: Self-pay | Admitting: Internal Medicine

## 2023-09-01 DIAGNOSIS — Z1231 Encounter for screening mammogram for malignant neoplasm of breast: Secondary | ICD-10-CM

## 2023-09-16 ENCOUNTER — Ambulatory Visit
Admission: RE | Admit: 2023-09-16 | Discharge: 2023-09-16 | Disposition: A | Discharge status: Home / Self Care | Payer: Self-pay | Source: Ambulatory Visit | Attending: Internal Medicine | Admitting: Internal Medicine

## 2023-09-16 DIAGNOSIS — Z1231 Encounter for screening mammogram for malignant neoplasm of breast: Secondary | ICD-10-CM | POA: Insufficient documentation

## 2023-09-18 DIAGNOSIS — I5032 Chronic diastolic (congestive) heart failure: Secondary | ICD-10-CM | POA: Diagnosis not present

## 2023-09-18 DIAGNOSIS — I700 Atherosclerosis of aorta: Secondary | ICD-10-CM | POA: Diagnosis not present

## 2023-09-18 DIAGNOSIS — Z79899 Other long term (current) drug therapy: Secondary | ICD-10-CM | POA: Diagnosis not present

## 2023-09-18 DIAGNOSIS — D649 Anemia, unspecified: Secondary | ICD-10-CM | POA: Diagnosis not present

## 2023-09-18 DIAGNOSIS — I130 Hypertensive heart and chronic kidney disease with heart failure and stage 1 through stage 4 chronic kidney disease, or unspecified chronic kidney disease: Secondary | ICD-10-CM | POA: Diagnosis not present

## 2023-09-18 DIAGNOSIS — J90 Pleural effusion, not elsewhere classified: Secondary | ICD-10-CM | POA: Diagnosis not present

## 2023-09-18 DIAGNOSIS — N183 Chronic kidney disease, stage 3 unspecified: Secondary | ICD-10-CM | POA: Diagnosis not present

## 2023-09-18 DIAGNOSIS — J449 Chronic obstructive pulmonary disease, unspecified: Secondary | ICD-10-CM | POA: Diagnosis not present

## 2023-09-18 DIAGNOSIS — J811 Chronic pulmonary edema: Secondary | ICD-10-CM | POA: Diagnosis not present

## 2023-09-18 DIAGNOSIS — R2243 Localized swelling, mass and lump, lower limb, bilateral: Secondary | ICD-10-CM | POA: Diagnosis not present

## 2023-09-18 DIAGNOSIS — E213 Hyperparathyroidism, unspecified: Secondary | ICD-10-CM | POA: Diagnosis not present

## 2023-09-18 DIAGNOSIS — R0989 Other specified symptoms and signs involving the circulatory and respiratory systems: Secondary | ICD-10-CM | POA: Diagnosis not present

## 2023-09-30 DIAGNOSIS — I2781 Cor pulmonale (chronic): Secondary | ICD-10-CM | POA: Diagnosis not present

## 2023-09-30 DIAGNOSIS — I5030 Unspecified diastolic (congestive) heart failure: Secondary | ICD-10-CM | POA: Diagnosis not present

## 2023-09-30 DIAGNOSIS — N183 Chronic kidney disease, stage 3 unspecified: Secondary | ICD-10-CM | POA: Diagnosis not present

## 2023-10-01 DIAGNOSIS — R32 Unspecified urinary incontinence: Secondary | ICD-10-CM | POA: Diagnosis not present

## 2023-10-01 DIAGNOSIS — N9089 Other specified noninflammatory disorders of vulva and perineum: Secondary | ICD-10-CM | POA: Diagnosis not present

## 2023-10-01 DIAGNOSIS — N958 Other specified menopausal and perimenopausal disorders: Secondary | ICD-10-CM | POA: Diagnosis not present

## 2023-10-01 DIAGNOSIS — Z599 Problem related to housing and economic circumstances, unspecified: Secondary | ICD-10-CM | POA: Diagnosis not present

## 2023-10-07 DIAGNOSIS — G8929 Other chronic pain: Secondary | ICD-10-CM | POA: Diagnosis not present

## 2023-10-07 DIAGNOSIS — M25511 Pain in right shoulder: Secondary | ICD-10-CM | POA: Diagnosis not present

## 2023-10-07 DIAGNOSIS — M12811 Other specific arthropathies, not elsewhere classified, right shoulder: Secondary | ICD-10-CM | POA: Diagnosis not present

## 2023-10-09 ENCOUNTER — Other Ambulatory Visit: Payer: Self-pay | Admitting: Sports Medicine

## 2023-10-09 DIAGNOSIS — M12811 Other specific arthropathies, not elsewhere classified, right shoulder: Secondary | ICD-10-CM

## 2023-10-09 DIAGNOSIS — G8929 Other chronic pain: Secondary | ICD-10-CM

## 2023-10-09 DIAGNOSIS — M19011 Primary osteoarthritis, right shoulder: Secondary | ICD-10-CM

## 2023-10-13 ENCOUNTER — Ambulatory Visit
Admission: RE | Admit: 2023-10-13 | Discharge: 2023-10-13 | Disposition: A | Discharge status: Home / Self Care | Source: Ambulatory Visit | Attending: Sports Medicine | Admitting: Sports Medicine

## 2023-10-13 DIAGNOSIS — M25511 Pain in right shoulder: Secondary | ICD-10-CM | POA: Insufficient documentation

## 2023-10-13 DIAGNOSIS — G8929 Other chronic pain: Secondary | ICD-10-CM | POA: Diagnosis not present

## 2023-10-13 DIAGNOSIS — M2540 Effusion, unspecified joint: Secondary | ICD-10-CM | POA: Diagnosis not present

## 2023-10-13 DIAGNOSIS — Z01818 Encounter for other preprocedural examination: Secondary | ICD-10-CM | POA: Diagnosis not present

## 2023-10-13 DIAGNOSIS — M12811 Other specific arthropathies, not elsewhere classified, right shoulder: Secondary | ICD-10-CM | POA: Insufficient documentation

## 2023-10-13 DIAGNOSIS — Z96611 Presence of right artificial shoulder joint: Secondary | ICD-10-CM | POA: Diagnosis not present

## 2023-10-13 DIAGNOSIS — M19011 Primary osteoarthritis, right shoulder: Secondary | ICD-10-CM | POA: Diagnosis not present

## 2023-10-14 ENCOUNTER — Emergency Department
Admission: EM | Admit: 2023-10-14 | Discharge: 2023-10-14 | Disposition: A | Discharge status: Home / Self Care | Attending: Emergency Medicine | Admitting: Emergency Medicine

## 2023-10-14 ENCOUNTER — Emergency Department

## 2023-10-14 ENCOUNTER — Encounter: Payer: Self-pay | Admitting: Emergency Medicine

## 2023-10-14 ENCOUNTER — Other Ambulatory Visit: Payer: Self-pay

## 2023-10-14 DIAGNOSIS — I2510 Atherosclerotic heart disease of native coronary artery without angina pectoris: Secondary | ICD-10-CM | POA: Insufficient documentation

## 2023-10-14 DIAGNOSIS — S0990XA Unspecified injury of head, initial encounter: Secondary | ICD-10-CM | POA: Diagnosis not present

## 2023-10-14 DIAGNOSIS — Z7901 Long term (current) use of anticoagulants: Secondary | ICD-10-CM | POA: Diagnosis not present

## 2023-10-14 DIAGNOSIS — S199XXA Unspecified injury of neck, initial encounter: Secondary | ICD-10-CM | POA: Diagnosis not present

## 2023-10-14 DIAGNOSIS — W108XXA Fall (on) (from) other stairs and steps, initial encounter: Secondary | ICD-10-CM | POA: Diagnosis not present

## 2023-10-14 DIAGNOSIS — I10 Essential (primary) hypertension: Secondary | ICD-10-CM | POA: Diagnosis not present

## 2023-10-14 DIAGNOSIS — M25552 Pain in left hip: Secondary | ICD-10-CM | POA: Diagnosis not present

## 2023-10-14 DIAGNOSIS — J449 Chronic obstructive pulmonary disease, unspecified: Secondary | ICD-10-CM | POA: Diagnosis not present

## 2023-10-14 DIAGNOSIS — W19XXXA Unspecified fall, initial encounter: Secondary | ICD-10-CM

## 2023-10-14 DIAGNOSIS — Z96643 Presence of artificial hip joint, bilateral: Secondary | ICD-10-CM | POA: Diagnosis not present

## 2023-10-14 DIAGNOSIS — Y92009 Unspecified place in unspecified non-institutional (private) residence as the place of occurrence of the external cause: Secondary | ICD-10-CM | POA: Diagnosis not present

## 2023-10-14 DIAGNOSIS — M8580 Other specified disorders of bone density and structure, unspecified site: Secondary | ICD-10-CM | POA: Diagnosis not present

## 2023-10-14 DIAGNOSIS — S0003XA Contusion of scalp, initial encounter: Secondary | ICD-10-CM | POA: Diagnosis not present

## 2023-10-14 DIAGNOSIS — G319 Degenerative disease of nervous system, unspecified: Secondary | ICD-10-CM | POA: Diagnosis not present

## 2023-10-14 DIAGNOSIS — I6782 Cerebral ischemia: Secondary | ICD-10-CM | POA: Diagnosis not present

## 2023-10-14 MED ORDER — TRAMADOL HCL 50 MG PO TABS
100.0000 mg | ORAL_TABLET | Freq: Once | ORAL | Status: AC
  Administered 2023-10-14: 100 mg via ORAL
  Filled 2023-10-14: qty 2

## 2023-10-14 MED ORDER — OXYCODONE-ACETAMINOPHEN 5-325 MG PO TABS
1.0000 | ORAL_TABLET | Freq: Once | ORAL | Status: AC
  Administered 2023-10-14: 1 via ORAL
  Filled 2023-10-14: qty 1

## 2023-10-14 NOTE — ED Triage Notes (Signed)
 Patient to ED via [facility] after a fall. Patient reports tripping at home. States she kit her head on the floor. Denies LOC but does take blood thinners. Also having left hip pain.

## 2023-10-14 NOTE — ED Notes (Signed)
 Pt seen ambulating to bed with assistance of family. Pt tolerated ambulating well. Pt's vitals updated and pt assisted to wheelchair to ED lobby.

## 2023-10-14 NOTE — ED Notes (Signed)
 First nurse note: Pt here via [facility] from home. Pt had fell due to tripping on a doorway, hematoma on front of forehead, no LOC.   158/80 HR:78 96%

## 2023-10-14 NOTE — ED Provider Notes (Signed)
 [facility] Emergency Department Provider Note     Event Date/Time   First MD Initiated Contact with Patient 10/14/23 1137     (approximate)   History   Fall   HPI  Ann Silva is a 78 y.o. female with a history of COPD, arthritis, HTN, and CAD on Eliquis presents to the ED for evaluation following a mechanical fall today at home.  Patient reports she tripped over a step down in her home and fell to the floor.  She does report hitting her head but no LOC.  She reports left hip pain.  Patient is ambulatory.     Physical Exam   Triage Vital Signs: ED Triage Vitals  Encounter Vitals Group     BP 10/14/23 1110 122/61     Systolic BP Percentile --      Diastolic BP Percentile --      Pulse Rate 10/14/23 1110 63     Resp 10/14/23 1110 17     Temp 10/14/23 1110 98.7 F (37.1 C)     Temp Source 10/14/23 1110 Oral     SpO2 10/14/23 1110 95 %     Weight 10/14/23 1112 183 lb (83 kg)     Height 10/14/23 1112 4\' 9"  (1.448 m)     Head Circumference --      Peak Flow --      Pain Score 10/14/23 1112 4     Pain Loc --      Pain Education --      Exclude from Growth Chart --     Most recent vital signs: Vitals:   10/14/23 1110 10/14/23 1510  BP: 122/61 127/73  Pulse: 63 (!) 59  Resp: 17 16  Temp: 98.7 F (37.1 C)   SpO2: 95% 94%    General: Alert and oriented. INAD.     Head:  NCAT.  Nontender to palpation.  Large Hematoma noticed on forehead Eyes:  PERRLA. EOMI.  Ears:  No postauricular ecchymosis Neck:   No cervical spine tenderness to palpation. Full ROM without difficulty.  CV:  Good peripheral perfusion. RRR. No peripheral edema.  RESP:  Normal effort. LCTAB.  ABD:  No distention. Soft, Non tender.  BACK:  Spinous process is midline without deformity or tenderness. MSK:   Tenderness to left hip with palpation.   NEURO: Cranial nerves intact. No focal deficits. Sensation and motor function intact.   ED Results / Procedures /  Treatments   Labs (all labs ordered are listed, but only abnormal results are displayed) Labs Reviewed - No data to display  RADIOLOGY  I personally viewed and evaluated these images as part of my medical decision making, as well as reviewing the written report by the radiologist.  ED Provider Interpretation: CT head and CT cervical spine are reassuring.  No acute findings on hip x-ray  CT Head Wo Contrast Result Date: 10/14/2023 CLINICAL DATA:  Fall and trauma to the head and neck. EXAM: CT HEAD WITHOUT CONTRAST CT CERVICAL SPINE WITHOUT CONTRAST TECHNIQUE: Multidetector CT imaging of the head and cervical spine was performed following the standard protocol without intravenous contrast. Multiplanar CT image reconstructions of the cervical spine were also generated. RADIATION DOSE REDUCTION: This exam was performed according to the departmental dose-optimization program which includes automated exposure control, adjustment of the mA and/or kV according to patient size and/or use of iterative reconstruction technique. COMPARISON:  Head CT dated 04/09/2018. FINDINGS: CT HEAD FINDINGS Brain: Moderate age-related atrophy  and chronic microvascular ischemic changes. There is no acute intracranial hemorrhage. No mass effect or midline shift. No extra-axial fluid collection. Vascular: No hyperdense vessel or unexpected calcification. Skull: Normal. Negative for fracture or focal lesion. Sinuses/Orbits: No acute finding. Other: Small contusion over the forehead. CT CERVICAL SPINE FINDINGS Alignment: No acute subluxation. There is reversal of normal cervical lordosis which may be positional or due to muscle spasm. Skull base and vertebrae: No acute fracture.  Osteopenia. Soft tissues and spinal canal: No prevertebral fluid or swelling. No visible canal hematoma. Disc levels:  No acute findings.  Degenerative changes. Upper chest: Negative. Other: None IMPRESSION: 1. No acute intracranial pathology. Moderate  age-related atrophy and chronic microvascular ischemic changes. 2. No acute/traumatic cervical spine pathology. Electronically Signed   By: Elgie Collard M.D.   On: 10/14/2023 14:34   CT Cervical Spine Wo Contrast Result Date: 10/14/2023 CLINICAL DATA:  Fall and trauma to the head and neck. EXAM: CT HEAD WITHOUT CONTRAST CT CERVICAL SPINE WITHOUT CONTRAST TECHNIQUE: Multidetector CT imaging of the head and cervical spine was performed following the standard protocol without intravenous contrast. Multiplanar CT image reconstructions of the cervical spine were also generated. RADIATION DOSE REDUCTION: This exam was performed according to the departmental dose-optimization program which includes automated exposure control, adjustment of the mA and/or kV according to patient size and/or use of iterative reconstruction technique. COMPARISON:  Head CT dated 04/09/2018. FINDINGS: CT HEAD FINDINGS Brain: Moderate age-related atrophy and chronic microvascular ischemic changes. There is no acute intracranial hemorrhage. No mass effect or midline shift. No extra-axial fluid collection. Vascular: No hyperdense vessel or unexpected calcification. Skull: Normal. Negative for fracture or focal lesion. Sinuses/Orbits: No acute finding. Other: Small contusion over the forehead. CT CERVICAL SPINE FINDINGS Alignment: No acute subluxation. There is reversal of normal cervical lordosis which may be positional or due to muscle spasm. Skull base and vertebrae: No acute fracture.  Osteopenia. Soft tissues and spinal canal: No prevertebral fluid or swelling. No visible canal hematoma. Disc levels:  No acute findings.  Degenerative changes. Upper chest: Negative. Other: None IMPRESSION: 1. No acute intracranial pathology. Moderate age-related atrophy and chronic microvascular ischemic changes. 2. No acute/traumatic cervical spine pathology. Electronically Signed   By: Elgie Collard M.D.   On: 10/14/2023 14:34   DG Hip Unilat W or  Wo Pelvis 2-3 Views Left Result Date: 10/14/2023 CLINICAL DATA:  Status post fall with left hip pain EXAM: DG HIP (WITH OR WITHOUT PELVIS) 3V LEFT COMPARISON:  Left hip radiograph dated 12/11/2020 FINDINGS: Postsurgical changes of left hip arthroplasty. Hardware appears intact. No acute displaced fracture or dislocation. Diffuse osteopenia. There is apparent discontinuity of the greater trochanter, which appears chronic. Multiple fractured cerclage wires project over the lateral thigh. Partially imaged sacral nerve stimulator device projects over the right lower quadrant with lead projecting over the right paramedian sacral spine. Partially imaged lumbar spinal fixation hardware is appear intact. Partially imaged right hip arthroplasty hardware appears intact. IMPRESSION: 1. Postsurgical changes of left hip arthroplasty without acute displaced fracture or dislocation. 2. Diffuse osteopenia with apparent discontinuity of the greater trochanter, which appears chronic. Electronically Signed   By: Agustin Cree M.D.   On: 10/14/2023 13:26    PROCEDURES:  Critical Care performed: No  Procedures   MEDICATIONS ORDERED IN ED: Medications  traMADol (ULTRAM) tablet 100 mg (100 mg Oral Given 10/14/23 1304)  oxyCODONE-acetaminophen (PERCOCET/ROXICET) 5-325 MG per tablet 1 tablet (1 tablet Oral Given 10/14/23 1433)  IMPRESSION / MDM / ASSESSMENT AND PLAN / ED COURSE  I reviewed the triage vital signs and the nursing notes.                             Clinical Course as of 10/14/23 1711  Tue Oct 14, 2023  1708 Patient takes 150 mg of tramadol at home.  She is requesting this medication.  On reassessment no improvement with pain.  Shared discussion for Percocet administration in which patient agreed. [MH]    Clinical Course User Index [MH] Kern Reap A, PA-C   78 y.o. female presents to the emergency department for evaluation and treatment of fall. See HPI for further details.   Differential diagnosis  includes, but is not limited to ICH, hematoma, fracture, dislocation  Patient's presentation is most consistent with acute complicated illness / injury requiring diagnostic workup.  Patient is alert and oriented.  She is hemodynamically stable.  CT head, CT cervical spine and left hip x-ray obtained in triage.  These results are reassuring.  Physical exam findings are stated above. No red Flag signs.  Neuroexam is reassuring.  Patient does not meet admission criteria and I do believe she is in stable condition for outpatient management.  I encouraged her to follow-up with her PCP. The patient is in stable and satisfactory condition for discharge home. ED precautions discussed. All questions and concerns were addressed during this ED visit.     FINAL CLINICAL IMPRESSION(S) / ED DIAGNOSES   Final diagnoses:  Fall, initial encounter  Minor head injury, initial encounter  Contusion of scalp, initial encounter   Rx / DC Orders   ED Discharge Orders     None       Note:  This document was prepared using Dragon voice recognition software and may include unintentional dictation errors.    Romeo Apple, Luka Reisch A, PA-C 10/14/23 1711    Chesley Noon, MD 10/15/23 1504

## 2023-10-14 NOTE — Discharge Instructions (Addendum)
 You were evaluated in the ED for a fall.  Your head CT, cervical spine CT and hip x-rays are all reassuring.  Please follow-up with your PCP as needed.  Alternate taking ibuprofen and Tylenol for pain as needed.

## 2023-10-22 DIAGNOSIS — I5030 Unspecified diastolic (congestive) heart failure: Secondary | ICD-10-CM | POA: Diagnosis not present

## 2023-10-22 DIAGNOSIS — M7062 Trochanteric bursitis, left hip: Secondary | ICD-10-CM | POA: Diagnosis not present

## 2023-10-22 DIAGNOSIS — I2781 Cor pulmonale (chronic): Secondary | ICD-10-CM | POA: Diagnosis not present

## 2023-10-22 DIAGNOSIS — Z9181 History of falling: Secondary | ICD-10-CM | POA: Diagnosis not present

## 2023-11-11 DIAGNOSIS — M25552 Pain in left hip: Secondary | ICD-10-CM | POA: Diagnosis not present

## 2023-11-11 DIAGNOSIS — J449 Chronic obstructive pulmonary disease, unspecified: Secondary | ICD-10-CM | POA: Diagnosis not present

## 2023-11-15 ENCOUNTER — Other Ambulatory Visit: Payer: Self-pay

## 2023-11-15 ENCOUNTER — Emergency Department
Admission: EM | Admit: 2023-11-15 | Discharge: 2023-11-16 | Disposition: A | Discharge status: Home / Self Care | Attending: Emergency Medicine | Admitting: Emergency Medicine

## 2023-11-15 ENCOUNTER — Encounter: Payer: Self-pay | Admitting: Emergency Medicine

## 2023-11-15 ENCOUNTER — Emergency Department

## 2023-11-15 DIAGNOSIS — M79605 Pain in left leg: Secondary | ICD-10-CM | POA: Diagnosis not present

## 2023-11-15 DIAGNOSIS — I700 Atherosclerosis of aorta: Secondary | ICD-10-CM | POA: Diagnosis not present

## 2023-11-15 DIAGNOSIS — I10 Essential (primary) hypertension: Secondary | ICD-10-CM | POA: Insufficient documentation

## 2023-11-15 DIAGNOSIS — Z96641 Presence of right artificial hip joint: Secondary | ICD-10-CM | POA: Diagnosis not present

## 2023-11-15 DIAGNOSIS — Z96642 Presence of left artificial hip joint: Secondary | ICD-10-CM | POA: Diagnosis not present

## 2023-11-15 DIAGNOSIS — R42 Dizziness and giddiness: Secondary | ICD-10-CM | POA: Diagnosis not present

## 2023-11-15 DIAGNOSIS — R918 Other nonspecific abnormal finding of lung field: Secondary | ICD-10-CM | POA: Diagnosis not present

## 2023-11-15 DIAGNOSIS — M25552 Pain in left hip: Secondary | ICD-10-CM | POA: Diagnosis not present

## 2023-11-15 DIAGNOSIS — M898X5 Other specified disorders of bone, thigh: Secondary | ICD-10-CM

## 2023-11-15 DIAGNOSIS — R0602 Shortness of breath: Secondary | ICD-10-CM | POA: Diagnosis not present

## 2023-11-15 DIAGNOSIS — M129 Arthropathy, unspecified: Secondary | ICD-10-CM | POA: Diagnosis not present

## 2023-11-15 LAB — COMPREHENSIVE METABOLIC PANEL WITH GFR
ALT: 14 U/L (ref 0–44)
AST: 19 U/L (ref 15–41)
Albumin: 4 g/dL (ref 3.5–5.0)
Alkaline Phosphatase: 133 U/L — ABNORMAL HIGH (ref 38–126)
Anion gap: 8 (ref 5–15)
BUN: 26 mg/dL — ABNORMAL HIGH (ref 8–23)
CO2: 25 mmol/L (ref 22–32)
Calcium: 10.6 mg/dL — ABNORMAL HIGH (ref 8.9–10.3)
Chloride: 104 mmol/L (ref 98–111)
Creatinine, Ser: 0.83 mg/dL (ref 0.44–1.00)
GFR, Estimated: >60 mL/min (ref >60)
Glucose, Bld: 99 mg/dL (ref 70–99)
Potassium: 4.5 mmol/L (ref 3.5–5.1)
Sodium: 137 mmol/L (ref 135–145)
Total Bilirubin: 0.5 mg/dL (ref 0.0–1.2)
Total Protein: 7.9 g/dL (ref 6.5–8.1)

## 2023-11-15 LAB — CBC WITH DIFFERENTIAL/PLATELET
Abs Immature Granulocytes: 0.03 10*3/uL (ref 0.00–0.07)
Basophils Absolute: 0 10*3/uL (ref 0.0–0.1)
Basophils Relative: 0 %
Eosinophils Absolute: 0.3 10*3/uL (ref 0.0–0.5)
Eosinophils Relative: 4 %
HCT: 40.5 % (ref 36.0–46.0)
Hemoglobin: 13 g/dL (ref 12.0–15.0)
Immature Granulocytes: 1 %
Lymphocytes Relative: 32 %
Lymphs Abs: 2 10*3/uL (ref 0.7–4.0)
MCH: 29.5 pg (ref 26.0–34.0)
MCHC: 32.1 g/dL (ref 30.0–36.0)
MCV: 92 fL (ref 80.0–100.0)
Monocytes Absolute: 0.5 10*3/uL (ref 0.1–1.0)
Monocytes Relative: 8 %
Neutro Abs: 3.4 10*3/uL (ref 1.7–7.7)
Neutrophils Relative %: 55 %
Platelets: 243 10*3/uL (ref 150–400)
RBC: 4.4 MIL/uL (ref 3.87–5.11)
RDW: 15.8 % — ABNORMAL HIGH (ref 11.5–15.5)
WBC: 6.1 10*3/uL (ref 4.0–10.5)
nRBC: 0 % (ref 0.0–0.2)

## 2023-11-15 LAB — LACTIC ACID, PLASMA: Lactic Acid, Venous: 1.5 mmol/L (ref 0.5–1.9)

## 2023-11-15 NOTE — Discharge Instructions (Addendum)
 As we discussed, your evaluation tonight was reassuring, and there is no evidence that you have an infection in your hip or upper thigh.  We talked about the fact that your x-rays suggest that is possible that there is some loosening of the hardware in your hip.  Dr. Hooten will be able to address this when you see him in clinic and may need additional imaging or have a different plan of care.  At this time we encourage you to continue taking your regular medications including the tramadol  for pain, and follow-up with Dr. Aubry Silva as scheduled and Dr. Annabell Silva as needed.    Return to the emergency department if you develop new or worsening symptoms that concern you.

## 2023-11-15 NOTE — ED Provider Notes (Signed)
 [facility] Provider Note    Event Date/Time   First MD Initiated Contact with Patient 11/15/23 2312     (approximate)   History   Leg Pain   HPI Ann Silva is a 78 y.o. female who presents for evaluation of pain in her left hip that has been present for a couple of weeks and has been steady and constant.  About 10 years ago she had a hip replacement and had numerous complications as a result of the prosthetic device that was used and she had some issue with postoperative infection.  The surgery took place in [place] but her regular orthopedic surgeon is Dr. Aubry Blase.  She reports that about 2 weeks ago she had a fall where she hit her head but after that time her hip has been more sore than usual.  She was seen in the emergency department and cleared including x-rays of the hip, but she has had persistent pain and difficulty ambulating on the left leg more than usual.  It is not exactly the hip or the pelvis but the middle of the femur.  She is still able to ambulate but hurts to do so.  She has scheduled a follow-up appointment with Dr. Hooten for 6 days from today.  Starting yesterday she has had intermittent episodes where she feels flushed and lightheaded and anxious.  She said that this happens frequently to her and she knows it is probably an anxiety attack or panic attack but she started to be worried that she has another infection in her leg, so she decided to come get checked out.  Other than the chronic pain in her left femur/hip, she feels fine right now with no other symptoms.  She denies having any chest pain or shortness of breath associated with the episode.  She is very diligent about taking her Eliquis  because of a prior history of blood clots.  She has had no fever, nausea, vomiting, nor abdominal pain.     Physical Exam   Triage Vital Signs: ED Triage Vitals  Encounter Vitals Group     BP 11/15/23 2213 137/87     Systolic BP Percentile  --      Diastolic BP Percentile --      Pulse Rate 11/15/23 2213 87     Resp 11/15/23 2213 16     Temp 11/15/23 2213 98.5 F (36.9 C)     Temp Source 11/15/23 2213 Oral     SpO2 11/15/23 2154 95 %     Weight 11/15/23 2213 84.4 kg (186 lb)     Height 11/15/23 2213 1.473 m (4\' 10" )     Head Circumference --      Peak Flow --      Pain Score 11/15/23 2221 8     Pain Loc --      Pain Education --      Exclude from Growth Chart --     Most recent vital signs: Vitals:   11/15/23 2154 11/15/23 2213  BP:  137/87  Pulse:  87  Resp:  16  Temp:  98.5 F (36.9 C)  SpO2: 95% 94%    General: Awake, no distress.  Generally well-appearing, alert and oriented, conversant, in good spirits. CV:  Good peripheral perfusion.  Regular rate and rhythm, normal heart sounds. Resp:  Normal effort. Speaking easily and comfortably, no accessory muscle usage nor intercostal retractions.  Lungs clear to auscultation bilaterally. Abd:  No distention.  Other:  Well-appearing long-term surgical site of the left lateral proximal femur.  No erythema, no tenderness to palpation, no warmth.  No tenderness to manipulation or palpation, and there is no induration or palpable deformity.   ED Results / Procedures / Treatments   Labs (all labs ordered are listed, but only abnormal results are displayed) Labs Reviewed  COMPREHENSIVE METABOLIC PANEL WITH GFR - Abnormal; Notable for the following components:      Result Value   BUN 26 (*)    Calcium  10.6 (*)    Alkaline Phosphatase 133 (*)    All other components within normal limits  CBC WITH DIFFERENTIAL/PLATELET - Abnormal; Notable for the following components:   RDW 15.8 (*)    All other components within normal limits  LACTIC ACID, PLASMA  LACTIC ACID, PLASMA  URINALYSIS, W/ REFLEX TO CULTURE (INFECTION SUSPECTED)     EKG  ED ECG REPORT I, Lynnda Sas, the attending physician, personally viewed and interpreted this ECG.  Date: 11/15/2023 EKG  Time: 23: 55 Rate: 81 Rhythm: normal sinus rhythm QRS Axis: normal Intervals: Incomplete right bundle branch block and left anterior fascicular block ST/T Wave abnormalities: Non-specific ST segment / T-wave changes, but no clear evidence of acute ischemia. Narrative Interpretation: no definitive evidence of acute ischemia; does not meet STEMI criteria.    RADIOLOGY I independently viewed and interpreted the patient's two-view chest x-ray and there is a little bit of bronchial thickening but no lobar pneumonia.  Radiology confirmed some bronchial thickening.  I also independently viewed and interpreted the patient's hip/pelvis x-rays (left).  Hardware looks intact to me with no obvious acute abnormalities.  However the radiologist pointed out some lucency that would suggest possible loosening of the hardware, but this was also present on the last imaging she had.   PROCEDURES:  Critical Care performed: No  Procedures    IMPRESSION / MDM / ASSESSMENT AND PLAN / ED COURSE  I reviewed the triage vital signs and the nursing notes.                              Differential diagnosis includes, but is not limited to, hematoma, hip replacement hardware failure, infection, anxiety, PE, ACS.  Patient's presentation is most consistent with acute presentation with potential threat to life or bodily function.  Labs/studies ordered: Two-view chest x-ray, hip/pelvis x-rays of the left side, CBC with differential, lactic acid, CMP  Interventions/Medications given:  Medications - No data to display  (Note:  hospital course my include additional interventions and/or labs/studies not listed above.)   Vital signs stable other than some mild hypertension.  Patient is stable and in no distress.  She has been having the femur pain for a couple of weeks and has a follow-up appointment scheduled with orthopedics in 6 days.  She also regularly has the "episodes" where she feels lightheaded and  anxious and like she is going to pass out.  EKG is unchanged from prior.  Labs are all within normal limits including no leukocytosis and normal lactic acid.  Her physical exam is quite reassuring with no evidence of infection of the old surgical wound.  Her main issue is the question of infection and that is reassuring tonight.  No indication for further evaluation at this time.  I informed her about the question of the loosening hardware and how that could explain her pain, but the Dr. Jeremiah Monica will be able to further address this in  her follow-up appointment.  She is perfectly comfortable with this plan.  The patient's medical screening exam is reassuring with no indication of an emergent medical condition requiring hospitalization or additional evaluation at this point.  The patient is safe and appropriate for discharge and outpatient follow up.         FINAL CLINICAL IMPRESSION(S) / ED DIAGNOSES   Final diagnoses:  None     Rx / DC Orders   ED Discharge Orders     None        Note:  This document was prepared using Dragon voice recognition software and may include unintentional dictation errors.   Lynnda Sas, MD 11/16/23 [phone]

## 2023-11-15 NOTE — ED Notes (Signed)
 First nurse note: pt to ed via [facility] c/o left leg pain x 2 weeks, denies any known injury. Pt takes eliquis .

## 2023-11-15 NOTE — ED Triage Notes (Signed)
 Pt in from home with c/o severe L outer thigh pain worsened x 2wks. States she fell 77mo ago onto the L side, and all imaging was negative for injuries. +Pain, redness and warmth present to former surgical site from MRSA infection, pt states swelling has increased over the past week and she went to see her PCP on Monday, was then referred to surgeon Dr. Aubry Blase (pt to see him this coming Friday). Pain worsening in the meantime, hx DVT's as well, takes Eliquis  daily. +L pedal pulse present with doppler

## 2023-11-17 DIAGNOSIS — M19011 Primary osteoarthritis, right shoulder: Secondary | ICD-10-CM | POA: Diagnosis not present

## 2023-11-17 DIAGNOSIS — G8929 Other chronic pain: Secondary | ICD-10-CM | POA: Diagnosis not present

## 2023-11-17 DIAGNOSIS — M25511 Pain in right shoulder: Secondary | ICD-10-CM | POA: Diagnosis not present

## 2023-11-17 DIAGNOSIS — R29898 Other symptoms and signs involving the musculoskeletal system: Secondary | ICD-10-CM | POA: Diagnosis not present

## 2023-11-21 DIAGNOSIS — M5416 Radiculopathy, lumbar region: Secondary | ICD-10-CM | POA: Diagnosis not present

## 2023-11-21 DIAGNOSIS — M25552 Pain in left hip: Secondary | ICD-10-CM | POA: Diagnosis not present

## 2023-11-22 ENCOUNTER — Other Ambulatory Visit: Payer: Self-pay

## 2023-11-22 ENCOUNTER — Emergency Department
Admission: EM | Admit: 2023-11-22 | Discharge: 2023-11-22 | Disposition: A | Discharge status: Home / Self Care | Attending: Emergency Medicine | Admitting: Emergency Medicine

## 2023-11-22 DIAGNOSIS — Z85038 Personal history of other malignant neoplasm of large intestine: Secondary | ICD-10-CM | POA: Diagnosis not present

## 2023-11-22 DIAGNOSIS — R1110 Vomiting, unspecified: Secondary | ICD-10-CM | POA: Diagnosis present

## 2023-11-22 DIAGNOSIS — J449 Chronic obstructive pulmonary disease, unspecified: Secondary | ICD-10-CM | POA: Insufficient documentation

## 2023-11-22 DIAGNOSIS — A084 Viral intestinal infection, unspecified: Secondary | ICD-10-CM | POA: Insufficient documentation

## 2023-11-22 DIAGNOSIS — I10 Essential (primary) hypertension: Secondary | ICD-10-CM | POA: Insufficient documentation

## 2023-11-22 DIAGNOSIS — I2510 Atherosclerotic heart disease of native coronary artery without angina pectoris: Secondary | ICD-10-CM | POA: Insufficient documentation

## 2023-11-22 DIAGNOSIS — R1111 Vomiting without nausea: Secondary | ICD-10-CM | POA: Diagnosis not present

## 2023-11-22 DIAGNOSIS — E860 Dehydration: Secondary | ICD-10-CM | POA: Diagnosis not present

## 2023-11-22 DIAGNOSIS — R194 Change in bowel habit: Secondary | ICD-10-CM | POA: Diagnosis not present

## 2023-11-22 DIAGNOSIS — R001 Bradycardia, unspecified: Secondary | ICD-10-CM | POA: Diagnosis not present

## 2023-11-22 DIAGNOSIS — R0902 Hypoxemia: Secondary | ICD-10-CM | POA: Diagnosis not present

## 2023-11-22 LAB — CBC WITH DIFFERENTIAL/PLATELET
Abs Immature Granulocytes: 0.02 10*3/uL (ref 0.00–0.07)
Basophils Absolute: 0 10*3/uL (ref 0.0–0.1)
Basophils Relative: 0 %
Eosinophils Absolute: 0.1 10*3/uL (ref 0.0–0.5)
Eosinophils Relative: 1 %
HCT: 42 % (ref 36.0–46.0)
Hemoglobin: 13.9 g/dL (ref 12.0–15.0)
Immature Granulocytes: 0 %
Lymphocytes Relative: 6 %
Lymphs Abs: 0.4 10*3/uL — ABNORMAL LOW (ref 0.7–4.0)
MCH: 29.7 pg (ref 26.0–34.0)
MCHC: 33.1 g/dL (ref 30.0–36.0)
MCV: 89.7 fL (ref 80.0–100.0)
Monocytes Absolute: 0.3 10*3/uL (ref 0.1–1.0)
Monocytes Relative: 4 %
Neutro Abs: 6.9 10*3/uL (ref 1.7–7.7)
Neutrophils Relative %: 89 %
Platelets: 201 10*3/uL (ref 150–400)
RBC: 4.68 MIL/uL (ref 3.87–5.11)
RDW: 16.2 % — ABNORMAL HIGH (ref 11.5–15.5)
WBC: 7.7 10*3/uL (ref 4.0–10.5)
nRBC: 0 % (ref 0.0–0.2)

## 2023-11-22 LAB — COMPREHENSIVE METABOLIC PANEL WITH GFR
ALT: 16 U/L (ref 0–44)
AST: 24 U/L (ref 15–41)
Albumin: 3.6 g/dL (ref 3.5–5.0)
Alkaline Phosphatase: 120 U/L (ref 38–126)
Anion gap: 10 (ref 5–15)
BUN: 25 mg/dL — ABNORMAL HIGH (ref 8–23)
CO2: 22 mmol/L (ref 22–32)
Calcium: 10.2 mg/dL (ref 8.9–10.3)
Chloride: 102 mmol/L (ref 98–111)
Creatinine, Ser: 0.87 mg/dL (ref 0.44–1.00)
GFR, Estimated: >60 mL/min (ref >60)
Glucose, Bld: 102 mg/dL — ABNORMAL HIGH (ref 70–99)
Potassium: 4.2 mmol/L (ref 3.5–5.1)
Sodium: 134 mmol/L — ABNORMAL LOW (ref 135–145)
Total Bilirubin: 0.9 mg/dL (ref 0.0–1.2)
Total Protein: 7.2 g/dL (ref 6.5–8.1)

## 2023-11-22 MED ORDER — PANTOPRAZOLE SODIUM 40 MG IV SOLR
40.0000 mg | Freq: Once | INTRAVENOUS | Status: AC
  Administered 2023-11-22: 40 mg via INTRAVENOUS
  Filled 2023-11-22: qty 10

## 2023-11-22 MED ORDER — KETOROLAC TROMETHAMINE 15 MG/ML IJ SOLN
15.0000 mg | Freq: Once | INTRAMUSCULAR | Status: AC
Start: 2023-11-22 — End: 2023-11-22
  Administered 2023-11-22: 15 mg via INTRAVENOUS
  Filled 2023-11-22: qty 1

## 2023-11-22 MED ORDER — ONDANSETRON 4 MG PO TBDP: 4.0000 mg | ORAL_TABLET | Freq: Three times a day (TID) | ORAL | 0 refills | Status: AC | PRN

## 2023-11-22 MED ORDER — IPRATROPIUM-ALBUTEROL 0.5-2.5 (3) MG/3ML IN SOLN: 3.0000 mL | Freq: Once | RESPIRATORY_TRACT | Status: DC

## 2023-11-22 MED ORDER — LORAZEPAM 0.5 MG PO TABS: 0.2500 mg | ORAL_TABLET | ORAL | 0 refills | Status: AC | PRN

## 2023-11-22 MED ORDER — ONDANSETRON HCL 4 MG/2ML IJ SOLN
4.0000 mg | Freq: Once | INTRAMUSCULAR | Status: AC
  Administered 2023-11-22: 4 mg via INTRAVENOUS
  Filled 2023-11-22: qty 2

## 2023-11-22 MED ORDER — SODIUM CHLORIDE 0.9 % IV BOLUS
1000.0000 mL | Freq: Once | INTRAVENOUS | Status: AC
  Administered 2023-11-22: 1000 mL via INTRAVENOUS

## 2023-11-22 NOTE — ED Notes (Signed)
 Pt attempting PO trial with graham crackers and ginger ale at this time.

## 2023-11-22 NOTE — ED Provider Notes (Signed)
 [facility] Provider Note    Event Date/Time   First MD Initiated Contact with Patient 11/22/23 1737     (approximate)   History   Chief Complaint: Vomiting   HPI  Ann Silva is a 78 y.o. female with a history of anxiety, COPD, hypertension who comes the ED due to feeling dehydrated, diarrhea and vomiting.  Started this morning.  She notes that her husband was sick with diarrhea and vomiting yesterday after eating at [employer].  Denies chest pain shortness of breath or fever.  No black or bloody stool or hematemesis.  Feels better after vomiting, denies abdominal pain. Patient also notes anxiety episodes recently.  She has an appointment with her primary care doctor in 3 days, and requests additional medication to take as needed in the meantime.       Past Medical History:  Diagnosis Date   Anemia    takes ferrous sulfate  daily   Anxiety    Arthritis    Bladder spasm    Chronic back pain    spinal stenosis   Colon cancer (HCC)    Complication of anesthesia    B/P bottoms out, hard to wake up    Contact dermatitis    COPD (chronic obstructive pulmonary disease) (HCC)    Albuterol  inhaler and DuoNeb as needed   COPD (chronic obstructive pulmonary disease) (HCC)    Coronary artery disease    mild by 08/25/09 cath   Foot drop, left    Headache    Heart murmur    never had any problems   History of blood transfusion    no abnormal reaction noted   History of bronchitis    History of MRSA infection 6+ yrs ago   Hypertension    takes Metoprolol  daily   Hypertension    Insomnia    takes Trazodone  nightly   Joint pain    Joint swelling    Lower extremity edema    Nocturia    Panic attacks    takes Effexor daily   Peripheral edema    takes Lasix  daily and takes Bumex  every 3 days.Takes Aldactone  daily    Pulmonary emboli (HCC) 2018   Restless leg syndrome    takes Requip  daily   RLS (restless legs syndrome)    Sleep apnea     doesnt use CPAP    Current Outpatient Rx   Order #: [id number] Class: Normal   Order #: [id number] Class: Normal   Order #: [id number] Class: Historical Med   Order #: [id number] Class: Print   Order #: [id number] Class: Normal   Order #: [id number] Class: Historical Med   Order #: [id number] Class: Historical Med   Order #: [id number] Class: Historical Med   Order #: [id number] Class: Normal   Order #: [id number] Class: Normal   Order #: [id number] Class: Historical Med   Order #: [id number] Class: Historical Med   Order #: [id number] Class: Historical Med   Order #: [id number] Class: Historical Med   Order #: [id number] Class: Historical Med   Order #: [id number] Class: Normal   Order #: [id number] Class: Historical Med   Order #: [id number] Class: Historical Med   Order #: [id number] Class: Historical Med   Order #: [id number] Class: Historical Med   Order #: [id number] Class: No Print    Past Surgical History:  Procedure Laterality Date   ABDOMINAL HYSTERECTOMY     BACK SURGERY     BREAST EXCISIONAL BIOPSY Left 1980's   neg   CATARACT EXTRACTION W/PHACO Right 10/09/2021   Procedure: CATARACT EXTRACTION PHACO AND INTRAOCULAR  LENS PLACEMENT (IOC) RIGHT 23.94 01:52.0;  Surgeon: Clair Crews, MD;  Location: [facility];  Service: Ophthalmology;  Laterality: Right;   CATARACT EXTRACTION W/PHACO Left 10/23/2021   Procedure: CATARACT EXTRACTION PHACO AND INTRAOCULAR LENS PLACEMENT (IOC) LEFT 7.34 00:47.4;  Surgeon: Clair Crews, MD;  Location: [facility];  Service: Ophthalmology;  Laterality: Left;   CHOLECYSTECTOMY     COLON SURGERY     partial colectomy d/t cancer cells   COLONOSCOPY     HIP CLOSED REDUCTION Left 12/11/2020   Procedure: CLOSED REDUCTION HIP;  Surgeon: Molli Angelucci, MD;  Location: [facility];  Service: Orthopedics;  Laterality: Left;   I&D of left hip      x 4   INTERSTIM IMPLANT PLACEMENT N/A 03/08/2019   Procedure: Simona [place] IMPLANT FIRST AND SECOND STAGE WITH IMPEDENCE  CHECK;  Surgeon: Erman Hayward, MD;  Location: [facility];  Service: Urology;  Laterality: N/A;   JOINT REPLACEMENT     PULMONARY THROMBECTOMY Right 05/20/2022   Procedure: PULMONARY THROMBECTOMY;  Surgeon: Celso College, MD;  Location: [facility];  Service: Cardiovascular;  Laterality: Right;  Bilateral subsegmental   Resection total hip     Revision of total hip arthroplasty     x3-4   right hip replacement     TOTAL HIP ARTHROPLASTY Left     Physical Exam   Triage Vital Signs: ED Triage Vitals [11/22/23 1736]  Encounter Vitals Group     BP      Systolic BP Percentile      Diastolic BP Percentile      Pulse      Resp      Temp      Temp src      SpO2      Weight 186 lb (84.4 kg)     Height      Head Circumference      Peak Flow      Pain Score 0     Pain Loc      Pain Education      Exclude from Growth Chart     Most recent vital signs: Vitals:   11/22/23 1737  BP: 120/66  Pulse: 80  Resp: 16  Temp: 98.4 F (36.9 C)  SpO2: 92%    General: Awake, no distress.  CV:  Good peripheral perfusion.  Regular rate rhythm Resp:  Normal effort.  Clear auscultation bilaterally Abd:  No distention.  Soft nontender Other:  Moist oral mucosa   ED Results / Procedures / Treatments   Labs (all labs ordered are listed, but only abnormal results are displayed) Labs Reviewed  COMPREHENSIVE METABOLIC PANEL WITH GFR - Abnormal; Notable for the following components:      Result Value   Sodium 134 (*)    Glucose, Bld 102 (*)    BUN 25 (*)    All other components within normal limits  CBC WITH DIFFERENTIAL/PLATELET - Abnormal; Notable for the following components:   RDW 16.2 (*)    Lymphs Abs 0.4 (*)    All other components within normal limits     EKG    RADIOLOGY    PROCEDURES:  Procedures   MEDICATIONS ORDERED IN ED: Medications  sodium chloride  0.9 % bolus 1,000 mL (1,000 mLs Intravenous New Bag/Given 11/22/23 1749)  ondansetron  (ZOFRAN )  injection 4 mg (4 mg Intravenous Given 11/22/23 1750)  pantoprazole  (PROTONIX ) injection 40 mg (40 mg Intravenous Given 11/22/23 1750)  ketorolac  (TORADOL ) 15 MG/ML injection 15 mg (  15 mg Intravenous Given 11/22/23 1913)     IMPRESSION / MDM / ASSESSMENT AND PLAN / ED COURSE  I reviewed the triage vital signs and the nursing notes.  DDx: Viral gastroenteritis, dehydration, AKI, electrolyte derangement, anxiety  Patient's presentation is most consistent with acute presentation with potential threat to life or bodily function.     Clinical Course as of 11/22/23 1943  Sat Nov 22, 2023  1737 Patient complains of vomiting and diarrhea this morning.  Husband was sick with similar symptoms yesterday.  Ate normally yesterday at [employer], has had minimal oral intake today.  Feels dehydrated.  Denies pain.  Earlier had left upper quadrant pain which resolved after vomiting.  Denies symptoms of GI bleed.  Give IV fluids, Zofran , Protonix , p.o. trial. [PS]    Clinical Course User Index [PS] Jacquie Maudlin, MD     ----------------------------------------- 7:43 PM on 11/22/2023 ----------------------------------------- Feels better, tolerating oral intake, labs reassuring, vitals normal.  Doubt serious intra-abdominal pathology or sepsis or GI bleed.  Stable for discharge.  With her extensive medication list, safest thing to add for her anxiety is a low-dose benzodiazepine, will try a very limited prescription of Ativan  0.25 mg for her to take over the next few days until she follows up with her doctor.  FINAL CLINICAL IMPRESSION(S) / ED DIAGNOSES   Final diagnoses:  Viral gastroenteritis     Rx / DC Orders   ED Discharge Orders          Ordered    ondansetron  (ZOFRAN -ODT) 4 MG disintegrating tablet  Every 8 hours PRN        11/22/23 1940    LORazepam  (ATIVAN ) 0.5 MG tablet  Every 4 hours PRN        11/22/23 1940             Note:  This document was prepared using Dragon  voice recognition software and may include unintentional dictation errors.   Jacquie Maudlin, MD 11/22/23 [phone]

## 2023-11-22 NOTE — ED Triage Notes (Signed)
 Arrives from home c/o vomiting this morning x 1 and 2 loose stools.   VS wnl.

## 2023-11-25 ENCOUNTER — Encounter (INDEPENDENT_AMBULATORY_CARE_PROVIDER_SITE_OTHER): Payer: Self-pay

## 2023-11-25 DIAGNOSIS — A084 Viral intestinal infection, unspecified: Secondary | ICD-10-CM | POA: Diagnosis not present

## 2023-11-27 DIAGNOSIS — M069 Rheumatoid arthritis, unspecified: Secondary | ICD-10-CM | POA: Diagnosis not present

## 2023-11-27 DIAGNOSIS — J449 Chronic obstructive pulmonary disease, unspecified: Secondary | ICD-10-CM | POA: Diagnosis not present

## 2023-11-27 DIAGNOSIS — M19011 Primary osteoarthritis, right shoulder: Secondary | ICD-10-CM | POA: Diagnosis not present

## 2023-11-27 DIAGNOSIS — D649 Anemia, unspecified: Secondary | ICD-10-CM | POA: Diagnosis not present

## 2023-11-27 DIAGNOSIS — I2510 Atherosclerotic heart disease of native coronary artery without angina pectoris: Secondary | ICD-10-CM | POA: Diagnosis not present

## 2023-11-27 DIAGNOSIS — F419 Anxiety disorder, unspecified: Secondary | ICD-10-CM | POA: Diagnosis not present

## 2023-11-27 DIAGNOSIS — M4800 Spinal stenosis, site unspecified: Secondary | ICD-10-CM | POA: Diagnosis not present

## 2023-11-27 DIAGNOSIS — I10 Essential (primary) hypertension: Secondary | ICD-10-CM | POA: Diagnosis not present

## 2023-11-27 DIAGNOSIS — G4700 Insomnia, unspecified: Secondary | ICD-10-CM | POA: Diagnosis not present

## 2023-12-05 DIAGNOSIS — I10 Essential (primary) hypertension: Secondary | ICD-10-CM | POA: Diagnosis not present

## 2023-12-05 DIAGNOSIS — J449 Chronic obstructive pulmonary disease, unspecified: Secondary | ICD-10-CM | POA: Diagnosis not present

## 2023-12-05 DIAGNOSIS — M069 Rheumatoid arthritis, unspecified: Secondary | ICD-10-CM | POA: Diagnosis not present

## 2023-12-05 DIAGNOSIS — M19011 Primary osteoarthritis, right shoulder: Secondary | ICD-10-CM | POA: Diagnosis not present

## 2023-12-05 DIAGNOSIS — G4700 Insomnia, unspecified: Secondary | ICD-10-CM | POA: Diagnosis not present

## 2023-12-05 DIAGNOSIS — I2510 Atherosclerotic heart disease of native coronary artery without angina pectoris: Secondary | ICD-10-CM | POA: Diagnosis not present

## 2023-12-05 DIAGNOSIS — F419 Anxiety disorder, unspecified: Secondary | ICD-10-CM | POA: Diagnosis not present

## 2023-12-05 DIAGNOSIS — D649 Anemia, unspecified: Secondary | ICD-10-CM | POA: Diagnosis not present

## 2023-12-05 DIAGNOSIS — M4800 Spinal stenosis, site unspecified: Secondary | ICD-10-CM | POA: Diagnosis not present

## 2023-12-10 DIAGNOSIS — M4800 Spinal stenosis, site unspecified: Secondary | ICD-10-CM | POA: Diagnosis not present

## 2023-12-10 DIAGNOSIS — G4700 Insomnia, unspecified: Secondary | ICD-10-CM | POA: Diagnosis not present

## 2023-12-10 DIAGNOSIS — F419 Anxiety disorder, unspecified: Secondary | ICD-10-CM | POA: Diagnosis not present

## 2023-12-10 DIAGNOSIS — I2510 Atherosclerotic heart disease of native coronary artery without angina pectoris: Secondary | ICD-10-CM | POA: Diagnosis not present

## 2023-12-10 DIAGNOSIS — M19011 Primary osteoarthritis, right shoulder: Secondary | ICD-10-CM | POA: Diagnosis not present

## 2023-12-10 DIAGNOSIS — I10 Essential (primary) hypertension: Secondary | ICD-10-CM | POA: Diagnosis not present

## 2023-12-10 DIAGNOSIS — D649 Anemia, unspecified: Secondary | ICD-10-CM | POA: Diagnosis not present

## 2023-12-10 DIAGNOSIS — M069 Rheumatoid arthritis, unspecified: Secondary | ICD-10-CM | POA: Diagnosis not present

## 2023-12-10 DIAGNOSIS — J449 Chronic obstructive pulmonary disease, unspecified: Secondary | ICD-10-CM | POA: Diagnosis not present

## 2023-12-12 DIAGNOSIS — M19011 Primary osteoarthritis, right shoulder: Secondary | ICD-10-CM | POA: Diagnosis not present

## 2023-12-12 DIAGNOSIS — F419 Anxiety disorder, unspecified: Secondary | ICD-10-CM | POA: Diagnosis not present

## 2023-12-12 DIAGNOSIS — D649 Anemia, unspecified: Secondary | ICD-10-CM | POA: Diagnosis not present

## 2023-12-12 DIAGNOSIS — I10 Essential (primary) hypertension: Secondary | ICD-10-CM | POA: Diagnosis not present

## 2023-12-12 DIAGNOSIS — M069 Rheumatoid arthritis, unspecified: Secondary | ICD-10-CM | POA: Diagnosis not present

## 2023-12-12 DIAGNOSIS — I2510 Atherosclerotic heart disease of native coronary artery without angina pectoris: Secondary | ICD-10-CM | POA: Diagnosis not present

## 2023-12-12 DIAGNOSIS — M4800 Spinal stenosis, site unspecified: Secondary | ICD-10-CM | POA: Diagnosis not present

## 2023-12-12 DIAGNOSIS — J449 Chronic obstructive pulmonary disease, unspecified: Secondary | ICD-10-CM | POA: Diagnosis not present

## 2023-12-12 DIAGNOSIS — G4700 Insomnia, unspecified: Secondary | ICD-10-CM | POA: Diagnosis not present

## 2023-12-15 DIAGNOSIS — G4700 Insomnia, unspecified: Secondary | ICD-10-CM | POA: Diagnosis not present

## 2023-12-15 DIAGNOSIS — J449 Chronic obstructive pulmonary disease, unspecified: Secondary | ICD-10-CM | POA: Diagnosis not present

## 2023-12-15 DIAGNOSIS — M4800 Spinal stenosis, site unspecified: Secondary | ICD-10-CM | POA: Diagnosis not present

## 2023-12-15 DIAGNOSIS — I2510 Atherosclerotic heart disease of native coronary artery without angina pectoris: Secondary | ICD-10-CM | POA: Diagnosis not present

## 2023-12-15 DIAGNOSIS — M19011 Primary osteoarthritis, right shoulder: Secondary | ICD-10-CM | POA: Diagnosis not present

## 2023-12-15 DIAGNOSIS — F419 Anxiety disorder, unspecified: Secondary | ICD-10-CM | POA: Diagnosis not present

## 2023-12-15 DIAGNOSIS — D649 Anemia, unspecified: Secondary | ICD-10-CM | POA: Diagnosis not present

## 2023-12-15 DIAGNOSIS — M069 Rheumatoid arthritis, unspecified: Secondary | ICD-10-CM | POA: Diagnosis not present

## 2023-12-15 DIAGNOSIS — I10 Essential (primary) hypertension: Secondary | ICD-10-CM | POA: Diagnosis not present

## 2023-12-16 DIAGNOSIS — G4700 Insomnia, unspecified: Secondary | ICD-10-CM | POA: Diagnosis not present

## 2023-12-16 DIAGNOSIS — J449 Chronic obstructive pulmonary disease, unspecified: Secondary | ICD-10-CM | POA: Diagnosis not present

## 2023-12-16 DIAGNOSIS — F419 Anxiety disorder, unspecified: Secondary | ICD-10-CM | POA: Diagnosis not present

## 2023-12-16 DIAGNOSIS — M4800 Spinal stenosis, site unspecified: Secondary | ICD-10-CM | POA: Diagnosis not present

## 2023-12-16 DIAGNOSIS — D649 Anemia, unspecified: Secondary | ICD-10-CM | POA: Diagnosis not present

## 2023-12-16 DIAGNOSIS — I2510 Atherosclerotic heart disease of native coronary artery without angina pectoris: Secondary | ICD-10-CM | POA: Diagnosis not present

## 2023-12-16 DIAGNOSIS — M069 Rheumatoid arthritis, unspecified: Secondary | ICD-10-CM | POA: Diagnosis not present

## 2023-12-16 DIAGNOSIS — I10 Essential (primary) hypertension: Secondary | ICD-10-CM | POA: Diagnosis not present

## 2023-12-16 DIAGNOSIS — M19011 Primary osteoarthritis, right shoulder: Secondary | ICD-10-CM | POA: Diagnosis not present

## 2023-12-23 DIAGNOSIS — D649 Anemia, unspecified: Secondary | ICD-10-CM | POA: Diagnosis not present

## 2023-12-23 DIAGNOSIS — J449 Chronic obstructive pulmonary disease, unspecified: Secondary | ICD-10-CM | POA: Diagnosis not present

## 2023-12-23 DIAGNOSIS — G4700 Insomnia, unspecified: Secondary | ICD-10-CM | POA: Diagnosis not present

## 2023-12-23 DIAGNOSIS — M4800 Spinal stenosis, site unspecified: Secondary | ICD-10-CM | POA: Diagnosis not present

## 2023-12-23 DIAGNOSIS — I10 Essential (primary) hypertension: Secondary | ICD-10-CM | POA: Diagnosis not present

## 2023-12-23 DIAGNOSIS — I2510 Atherosclerotic heart disease of native coronary artery without angina pectoris: Secondary | ICD-10-CM | POA: Diagnosis not present

## 2023-12-23 DIAGNOSIS — M069 Rheumatoid arthritis, unspecified: Secondary | ICD-10-CM | POA: Diagnosis not present

## 2023-12-23 DIAGNOSIS — M19011 Primary osteoarthritis, right shoulder: Secondary | ICD-10-CM | POA: Diagnosis not present

## 2023-12-23 DIAGNOSIS — F419 Anxiety disorder, unspecified: Secondary | ICD-10-CM | POA: Diagnosis not present

## 2023-12-27 DIAGNOSIS — M069 Rheumatoid arthritis, unspecified: Secondary | ICD-10-CM | POA: Diagnosis not present

## 2023-12-27 DIAGNOSIS — F419 Anxiety disorder, unspecified: Secondary | ICD-10-CM | POA: Diagnosis not present

## 2023-12-27 DIAGNOSIS — M19011 Primary osteoarthritis, right shoulder: Secondary | ICD-10-CM | POA: Diagnosis not present

## 2023-12-27 DIAGNOSIS — G4700 Insomnia, unspecified: Secondary | ICD-10-CM | POA: Diagnosis not present

## 2023-12-27 DIAGNOSIS — J449 Chronic obstructive pulmonary disease, unspecified: Secondary | ICD-10-CM | POA: Diagnosis not present

## 2023-12-27 DIAGNOSIS — I10 Essential (primary) hypertension: Secondary | ICD-10-CM | POA: Diagnosis not present

## 2023-12-27 DIAGNOSIS — M4800 Spinal stenosis, site unspecified: Secondary | ICD-10-CM | POA: Diagnosis not present

## 2023-12-27 DIAGNOSIS — D649 Anemia, unspecified: Secondary | ICD-10-CM | POA: Diagnosis not present

## 2023-12-27 DIAGNOSIS — I2510 Atherosclerotic heart disease of native coronary artery without angina pectoris: Secondary | ICD-10-CM | POA: Diagnosis not present

## 2023-12-29 DIAGNOSIS — Z1331 Encounter for screening for depression: Secondary | ICD-10-CM | POA: Diagnosis not present

## 2023-12-29 DIAGNOSIS — J449 Chronic obstructive pulmonary disease, unspecified: Secondary | ICD-10-CM | POA: Diagnosis not present

## 2023-12-30 DIAGNOSIS — M19011 Primary osteoarthritis, right shoulder: Secondary | ICD-10-CM | POA: Diagnosis not present

## 2023-12-30 DIAGNOSIS — F419 Anxiety disorder, unspecified: Secondary | ICD-10-CM | POA: Diagnosis not present

## 2023-12-30 DIAGNOSIS — M069 Rheumatoid arthritis, unspecified: Secondary | ICD-10-CM | POA: Diagnosis not present

## 2023-12-30 DIAGNOSIS — I10 Essential (primary) hypertension: Secondary | ICD-10-CM | POA: Diagnosis not present

## 2023-12-30 DIAGNOSIS — I2510 Atherosclerotic heart disease of native coronary artery without angina pectoris: Secondary | ICD-10-CM | POA: Diagnosis not present

## 2023-12-30 DIAGNOSIS — J449 Chronic obstructive pulmonary disease, unspecified: Secondary | ICD-10-CM | POA: Diagnosis not present

## 2023-12-30 DIAGNOSIS — M4800 Spinal stenosis, site unspecified: Secondary | ICD-10-CM | POA: Diagnosis not present

## 2024-01-01 DIAGNOSIS — J449 Chronic obstructive pulmonary disease, unspecified: Secondary | ICD-10-CM | POA: Diagnosis not present

## 2024-01-01 DIAGNOSIS — M19011 Primary osteoarthritis, right shoulder: Secondary | ICD-10-CM | POA: Diagnosis not present

## 2024-01-01 DIAGNOSIS — D649 Anemia, unspecified: Secondary | ICD-10-CM | POA: Diagnosis not present

## 2024-01-01 DIAGNOSIS — I10 Essential (primary) hypertension: Secondary | ICD-10-CM | POA: Diagnosis not present

## 2024-01-01 DIAGNOSIS — I2510 Atherosclerotic heart disease of native coronary artery without angina pectoris: Secondary | ICD-10-CM | POA: Diagnosis not present

## 2024-01-01 DIAGNOSIS — M069 Rheumatoid arthritis, unspecified: Secondary | ICD-10-CM | POA: Diagnosis not present

## 2024-01-01 DIAGNOSIS — F419 Anxiety disorder, unspecified: Secondary | ICD-10-CM | POA: Diagnosis not present

## 2024-01-01 DIAGNOSIS — G4700 Insomnia, unspecified: Secondary | ICD-10-CM | POA: Diagnosis not present

## 2024-01-01 DIAGNOSIS — M4800 Spinal stenosis, site unspecified: Secondary | ICD-10-CM | POA: Diagnosis not present

## 2024-01-02 DIAGNOSIS — M12811 Other specific arthropathies, not elsewhere classified, right shoulder: Secondary | ICD-10-CM | POA: Diagnosis not present

## 2024-01-02 DIAGNOSIS — M75121 Complete rotator cuff tear or rupture of right shoulder, not specified as traumatic: Secondary | ICD-10-CM | POA: Diagnosis not present

## 2024-01-05 DIAGNOSIS — F419 Anxiety disorder, unspecified: Secondary | ICD-10-CM | POA: Diagnosis not present

## 2024-01-05 DIAGNOSIS — J449 Chronic obstructive pulmonary disease, unspecified: Secondary | ICD-10-CM | POA: Diagnosis not present

## 2024-01-05 DIAGNOSIS — I10 Essential (primary) hypertension: Secondary | ICD-10-CM | POA: Diagnosis not present

## 2024-01-05 DIAGNOSIS — M069 Rheumatoid arthritis, unspecified: Secondary | ICD-10-CM | POA: Diagnosis not present

## 2024-01-05 DIAGNOSIS — I2510 Atherosclerotic heart disease of native coronary artery without angina pectoris: Secondary | ICD-10-CM | POA: Diagnosis not present

## 2024-01-05 DIAGNOSIS — M4800 Spinal stenosis, site unspecified: Secondary | ICD-10-CM | POA: Diagnosis not present

## 2024-01-05 DIAGNOSIS — M19011 Primary osteoarthritis, right shoulder: Secondary | ICD-10-CM | POA: Diagnosis not present

## 2024-01-06 DIAGNOSIS — I2510 Atherosclerotic heart disease of native coronary artery without angina pectoris: Secondary | ICD-10-CM | POA: Diagnosis not present

## 2024-01-06 DIAGNOSIS — J449 Chronic obstructive pulmonary disease, unspecified: Secondary | ICD-10-CM | POA: Diagnosis not present

## 2024-01-06 DIAGNOSIS — M4800 Spinal stenosis, site unspecified: Secondary | ICD-10-CM | POA: Diagnosis not present

## 2024-01-06 DIAGNOSIS — M19011 Primary osteoarthritis, right shoulder: Secondary | ICD-10-CM | POA: Diagnosis not present

## 2024-01-06 DIAGNOSIS — D649 Anemia, unspecified: Secondary | ICD-10-CM | POA: Diagnosis not present

## 2024-01-06 DIAGNOSIS — M069 Rheumatoid arthritis, unspecified: Secondary | ICD-10-CM | POA: Diagnosis not present

## 2024-01-06 DIAGNOSIS — I10 Essential (primary) hypertension: Secondary | ICD-10-CM | POA: Diagnosis not present

## 2024-01-06 DIAGNOSIS — F419 Anxiety disorder, unspecified: Secondary | ICD-10-CM | POA: Diagnosis not present

## 2024-01-06 DIAGNOSIS — G4700 Insomnia, unspecified: Secondary | ICD-10-CM | POA: Diagnosis not present

## 2024-01-13 DIAGNOSIS — I2510 Atherosclerotic heart disease of native coronary artery without angina pectoris: Secondary | ICD-10-CM | POA: Diagnosis not present

## 2024-01-13 DIAGNOSIS — J449 Chronic obstructive pulmonary disease, unspecified: Secondary | ICD-10-CM | POA: Diagnosis not present

## 2024-01-13 DIAGNOSIS — G4700 Insomnia, unspecified: Secondary | ICD-10-CM | POA: Diagnosis not present

## 2024-01-13 DIAGNOSIS — I10 Essential (primary) hypertension: Secondary | ICD-10-CM | POA: Diagnosis not present

## 2024-01-13 DIAGNOSIS — M4800 Spinal stenosis, site unspecified: Secondary | ICD-10-CM | POA: Diagnosis not present

## 2024-01-13 DIAGNOSIS — M069 Rheumatoid arthritis, unspecified: Secondary | ICD-10-CM | POA: Diagnosis not present

## 2024-01-13 DIAGNOSIS — D649 Anemia, unspecified: Secondary | ICD-10-CM | POA: Diagnosis not present

## 2024-01-13 DIAGNOSIS — F419 Anxiety disorder, unspecified: Secondary | ICD-10-CM | POA: Diagnosis not present

## 2024-01-13 DIAGNOSIS — M19011 Primary osteoarthritis, right shoulder: Secondary | ICD-10-CM | POA: Diagnosis not present

## 2024-01-20 DIAGNOSIS — M7061 Trochanteric bursitis, right hip: Secondary | ICD-10-CM | POA: Diagnosis not present

## 2024-01-20 DIAGNOSIS — M25551 Pain in right hip: Secondary | ICD-10-CM | POA: Diagnosis not present

## 2024-01-21 DIAGNOSIS — M4800 Spinal stenosis, site unspecified: Secondary | ICD-10-CM | POA: Diagnosis not present

## 2024-01-21 DIAGNOSIS — M19011 Primary osteoarthritis, right shoulder: Secondary | ICD-10-CM | POA: Diagnosis not present

## 2024-01-21 DIAGNOSIS — J449 Chronic obstructive pulmonary disease, unspecified: Secondary | ICD-10-CM | POA: Diagnosis not present

## 2024-01-21 DIAGNOSIS — F419 Anxiety disorder, unspecified: Secondary | ICD-10-CM | POA: Diagnosis not present

## 2024-01-21 DIAGNOSIS — I2510 Atherosclerotic heart disease of native coronary artery without angina pectoris: Secondary | ICD-10-CM | POA: Diagnosis not present

## 2024-01-21 DIAGNOSIS — M069 Rheumatoid arthritis, unspecified: Secondary | ICD-10-CM | POA: Diagnosis not present

## 2024-01-21 DIAGNOSIS — I10 Essential (primary) hypertension: Secondary | ICD-10-CM | POA: Diagnosis not present

## 2024-01-21 DIAGNOSIS — D649 Anemia, unspecified: Secondary | ICD-10-CM | POA: Diagnosis not present

## 2024-01-21 DIAGNOSIS — G4700 Insomnia, unspecified: Secondary | ICD-10-CM | POA: Diagnosis not present

## 2024-01-23 DIAGNOSIS — J449 Chronic obstructive pulmonary disease, unspecified: Secondary | ICD-10-CM | POA: Diagnosis not present

## 2024-01-23 DIAGNOSIS — M4800 Spinal stenosis, site unspecified: Secondary | ICD-10-CM | POA: Diagnosis not present

## 2024-01-23 DIAGNOSIS — I10 Essential (primary) hypertension: Secondary | ICD-10-CM | POA: Diagnosis not present

## 2024-01-23 DIAGNOSIS — F419 Anxiety disorder, unspecified: Secondary | ICD-10-CM | POA: Diagnosis not present

## 2024-01-23 DIAGNOSIS — M069 Rheumatoid arthritis, unspecified: Secondary | ICD-10-CM | POA: Diagnosis not present

## 2024-01-23 DIAGNOSIS — M19011 Primary osteoarthritis, right shoulder: Secondary | ICD-10-CM | POA: Diagnosis not present

## 2024-01-23 DIAGNOSIS — G4700 Insomnia, unspecified: Secondary | ICD-10-CM | POA: Diagnosis not present

## 2024-01-23 DIAGNOSIS — I2510 Atherosclerotic heart disease of native coronary artery without angina pectoris: Secondary | ICD-10-CM | POA: Diagnosis not present

## 2024-01-23 DIAGNOSIS — D649 Anemia, unspecified: Secondary | ICD-10-CM | POA: Diagnosis not present

## 2024-01-28 DIAGNOSIS — R350 Frequency of micturition: Secondary | ICD-10-CM | POA: Diagnosis not present

## 2024-01-28 DIAGNOSIS — A09 Infectious gastroenteritis and colitis, unspecified: Secondary | ICD-10-CM | POA: Diagnosis not present

## 2024-01-30 ENCOUNTER — Other Ambulatory Visit: Payer: Self-pay | Admitting: Surgery

## 2024-02-06 ENCOUNTER — Inpatient Hospital Stay: Admission: RE | Admit: 2024-02-06 | Source: Ambulatory Visit

## 2024-02-12 ENCOUNTER — Encounter: Admission: RE | Payer: Self-pay | Source: Home / Self Care

## 2024-02-12 ENCOUNTER — Ambulatory Visit: Admission: RE | Admit: 2024-02-12 | Source: Home / Self Care | Admitting: Surgery

## 2024-02-12 SURGERY — ARTHROPLASTY, SHOULDER, TOTAL, REVERSE
Anesthesia: Choice | Site: Shoulder | Laterality: Right

## 2024-02-16 DIAGNOSIS — R197 Diarrhea, unspecified: Secondary | ICD-10-CM | POA: Diagnosis not present

## 2024-02-24 DIAGNOSIS — Z1331 Encounter for screening for depression: Secondary | ICD-10-CM | POA: Diagnosis not present

## 2024-02-24 DIAGNOSIS — R197 Diarrhea, unspecified: Secondary | ICD-10-CM | POA: Diagnosis not present

## 2024-02-24 DIAGNOSIS — K529 Noninfective gastroenteritis and colitis, unspecified: Secondary | ICD-10-CM | POA: Diagnosis not present

## 2024-02-24 DIAGNOSIS — F32A Depression, unspecified: Secondary | ICD-10-CM | POA: Diagnosis not present

## 2024-02-24 DIAGNOSIS — Z79899 Other long term (current) drug therapy: Secondary | ICD-10-CM | POA: Diagnosis not present

## 2024-02-24 DIAGNOSIS — R739 Hyperglycemia, unspecified: Secondary | ICD-10-CM | POA: Diagnosis not present

## 2024-02-24 DIAGNOSIS — Z0000 Encounter for general adult medical examination without abnormal findings: Secondary | ICD-10-CM | POA: Diagnosis not present

## 2024-02-26 DIAGNOSIS — R739 Hyperglycemia, unspecified: Secondary | ICD-10-CM | POA: Diagnosis not present

## 2024-02-26 DIAGNOSIS — K529 Noninfective gastroenteritis and colitis, unspecified: Secondary | ICD-10-CM | POA: Diagnosis not present

## 2024-03-03 DIAGNOSIS — Z03818 Encounter for observation for suspected exposure to other biological agents ruled out: Secondary | ICD-10-CM | POA: Diagnosis not present

## 2024-03-03 DIAGNOSIS — B349 Viral infection, unspecified: Secondary | ICD-10-CM | POA: Diagnosis not present

## 2024-03-03 DIAGNOSIS — K529 Noninfective gastroenteritis and colitis, unspecified: Secondary | ICD-10-CM | POA: Diagnosis not present

## 2024-03-09 ENCOUNTER — Ambulatory Visit: Payer: Self-pay

## 2024-03-09 NOTE — Telephone Encounter (Signed)
 This encounter was created in error - please disregard.

## 2024-03-09 NOTE — Telephone Encounter (Signed)
 FYI Only or Action Required?: FYI only for provider.  Patient was last seen in primary care on n/a  Called Nurse Triage reporting Diarrhea.  Symptoms began x 3 months.  Interventions attempted: Nothing.  Symptoms are: gradually worsening.  Triage Disposition: See PCP Within 2 Weeks  Patient/caregiver understands and will follow disposition?: Yes     Copied from CRM #[id number]. Topic: Clinical - Pink Word Triage >> Mar 09, 2024  4:48 PM Tiffany S wrote: Reason for Triage: Patient has had diarrhea for the past 3 month >> Mar 09, 2024  4:49 PM Tiffany S wrote: Patient has had diarrhea for the past 3 months new patient Reason for Disposition  Diarrhea is a chronic symptom (recurrent or ongoing AND present > 4 weeks)  Answer Assessment - Initial Assessment Questions 1. DIARRHEA SEVERITY: How bad is the diarrhea? How many more stools have you had in the past 24 hours than normal?      X2 or more times per day x 30 days 2. ONSET: When did the diarrhea begin?      X 3 months 3. STOOL DESCRIPTION:  How loose or watery is the diarrhea? What is the stool color? Is there any blood or mucous in the stool?     Watery loose stools 4. VOMITING: Are you also vomiting? If Yes, ask: How many times in the past 24 hours?      no 5. ABDOMEN PAIN: Are you having any abdomen pain? If Yes, ask: What does it feel like? (e.g., crampy, dull, intermittent, constant)      4/10 at times 6. ABDOMEN PAIN SEVERITY: If present, ask: How bad is the pain?  (e.g., Scale 1-10; mild, moderate, or severe)     4/10 at times 7. ORAL INTAKE: If vomiting, Have you been able to drink liquids? How much liquids have you had in the past 24 hours?     hydrated 8. HYDRATION: Any signs of dehydration? (e.g., dry mouth [not just dry lips], too weak to stand, dizziness, new weight loss) When did you last urinate?     Drinking plenty fluids 9. EXPOSURE: Have you traveled to a foreign country  recently? Have you been exposed to anyone with diarrhea? Could you have eaten any food that was spoiled?     no 10. ANTIBIOTIC USE: Are you taking antibiotics now or have you taken antibiotics in the past 2 months?       yes 11. OTHER SYMPTOMS: Do you have any other symptoms? (e.g., fever, blood in stool)       Abd Bloating at times 12. PREGNANCY: Is there any chance you are pregnant? When was your last menstrual period?       Na  NP appt scheduled  Protocols used: [facility]

## 2024-03-09 NOTE — Telephone Encounter (Signed)
 Patient has had diarrhea for the past 3 months new patient

## 2024-03-15 ENCOUNTER — Encounter: Admitting: Family Medicine

## 2024-03-16 NOTE — Progress Notes (Signed)
 Patient not seen- thought she was coming to GI specialist clinic.

## 2024-03-31 DIAGNOSIS — I10 Essential (primary) hypertension: Secondary | ICD-10-CM | POA: Diagnosis not present

## 2024-03-31 DIAGNOSIS — K591 Functional diarrhea: Secondary | ICD-10-CM | POA: Diagnosis not present

## 2024-03-31 DIAGNOSIS — Z599 Problem related to housing and economic circumstances, unspecified: Secondary | ICD-10-CM | POA: Diagnosis not present

## 2024-03-31 DIAGNOSIS — R152 Fecal urgency: Secondary | ICD-10-CM | POA: Diagnosis not present

## 2024-03-31 DIAGNOSIS — R159 Full incontinence of feces: Secondary | ICD-10-CM | POA: Diagnosis not present

## 2024-03-31 DIAGNOSIS — Z85038 Personal history of other malignant neoplasm of large intestine: Secondary | ICD-10-CM | POA: Diagnosis not present

## 2024-03-31 DIAGNOSIS — I2781 Cor pulmonale (chronic): Secondary | ICD-10-CM | POA: Diagnosis not present

## 2024-03-31 DIAGNOSIS — I5032 Chronic diastolic (congestive) heart failure: Secondary | ICD-10-CM | POA: Diagnosis not present

## 2024-03-31 DIAGNOSIS — Z7901 Long term (current) use of anticoagulants: Secondary | ICD-10-CM | POA: Diagnosis not present

## 2024-04-14 ENCOUNTER — Encounter: Payer: Self-pay | Admitting: Internal Medicine

## 2024-04-14 ENCOUNTER — Encounter
Admission: RE | Disposition: A | Discharge status: Home / Self Care | Payer: Self-pay | Source: Home / Self Care | Attending: Internal Medicine

## 2024-04-14 ENCOUNTER — Ambulatory Visit
Admission: RE | Admit: 2024-04-14 | Discharge: 2024-04-14 | Disposition: A | Discharge status: Home / Self Care | Attending: Internal Medicine | Admitting: Internal Medicine

## 2024-04-14 ENCOUNTER — Ambulatory Visit: Admitting: Anesthesiology

## 2024-04-14 DIAGNOSIS — Z85038 Personal history of other malignant neoplasm of large intestine: Secondary | ICD-10-CM | POA: Diagnosis not present

## 2024-04-14 DIAGNOSIS — K635 Polyp of colon: Secondary | ICD-10-CM | POA: Diagnosis not present

## 2024-04-14 DIAGNOSIS — K591 Functional diarrhea: Secondary | ICD-10-CM | POA: Diagnosis not present

## 2024-04-14 DIAGNOSIS — F419 Anxiety disorder, unspecified: Secondary | ICD-10-CM | POA: Insufficient documentation

## 2024-04-14 DIAGNOSIS — R0602 Shortness of breath: Secondary | ICD-10-CM | POA: Diagnosis not present

## 2024-04-14 DIAGNOSIS — I10 Essential (primary) hypertension: Secondary | ICD-10-CM | POA: Insufficient documentation

## 2024-04-14 DIAGNOSIS — Z6838 Body mass index (BMI) 38.0-38.9, adult: Secondary | ICD-10-CM | POA: Diagnosis not present

## 2024-04-14 DIAGNOSIS — G4730 Sleep apnea, unspecified: Secondary | ICD-10-CM | POA: Insufficient documentation

## 2024-04-14 DIAGNOSIS — J449 Chronic obstructive pulmonary disease, unspecified: Secondary | ICD-10-CM | POA: Diagnosis not present

## 2024-04-14 DIAGNOSIS — Z980 Intestinal bypass and anastomosis status: Secondary | ICD-10-CM | POA: Insufficient documentation

## 2024-04-14 DIAGNOSIS — K529 Noninfective gastroenteritis and colitis, unspecified: Secondary | ICD-10-CM | POA: Diagnosis present

## 2024-04-14 DIAGNOSIS — I5023 Acute on chronic systolic (congestive) heart failure: Secondary | ICD-10-CM | POA: Diagnosis not present

## 2024-04-14 DIAGNOSIS — Z09 Encounter for follow-up examination after completed treatment for conditions other than malignant neoplasm: Secondary | ICD-10-CM | POA: Diagnosis not present

## 2024-04-14 DIAGNOSIS — E66813 Obesity, class 3: Secondary | ICD-10-CM | POA: Diagnosis not present

## 2024-04-14 DIAGNOSIS — Z87891 Personal history of nicotine dependence: Secondary | ICD-10-CM | POA: Diagnosis not present

## 2024-04-14 DIAGNOSIS — I2510 Atherosclerotic heart disease of native coronary artery without angina pectoris: Secondary | ICD-10-CM | POA: Insufficient documentation

## 2024-04-14 DIAGNOSIS — K5730 Diverticulosis of large intestine without perforation or abscess without bleeding: Secondary | ICD-10-CM | POA: Diagnosis not present

## 2024-04-14 DIAGNOSIS — D123 Benign neoplasm of transverse colon: Secondary | ICD-10-CM | POA: Diagnosis not present

## 2024-04-14 DIAGNOSIS — I110 Hypertensive heart disease with heart failure: Secondary | ICD-10-CM | POA: Diagnosis not present

## 2024-04-14 HISTORY — PX: HEMOSTASIS CLIP PLACEMENT: SHX6857

## 2024-04-14 HISTORY — PX: COLONOSCOPY: SHX5424

## 2024-04-14 HISTORY — PX: POLYPECTOMY: SHX149

## 2024-04-14 SURGERY — COLONOSCOPY
Anesthesia: General

## 2024-04-14 MED ORDER — PROPOFOL 10 MG/ML IV BOLUS
INTRAVENOUS | Status: DC | PRN
Start: 2024-04-14 — End: 2024-04-14
  Administered 2024-04-14: 20 mg via INTRAVENOUS
  Administered 2024-04-14: 30 mg via INTRAVENOUS
  Administered 2024-04-14: 20 mg via INTRAVENOUS

## 2024-04-14 MED ORDER — PROPOFOL 500 MG/50ML IV EMUL
INTRAVENOUS | Status: DC | PRN
  Administered 2024-04-14: 75 ug/kg/min via INTRAVENOUS

## 2024-04-14 MED ORDER — LIDOCAINE HCL (CARDIAC) PF 100 MG/5ML IV SOSY
PREFILLED_SYRINGE | INTRAVENOUS | Status: DC | PRN
  Administered 2024-04-14: 60 mg via INTRAVENOUS

## 2024-04-14 MED ORDER — SODIUM CHLORIDE 0.9 % IV SOLN: INTRAVENOUS | Status: DC

## 2024-04-14 NOTE — OR Nursing (Signed)
 Pt BP elevated prior to going home, home BP meds not taken today (Metoprolol , torsemide ), 2nd BP after case was 148/93, third BP 166/101, taken in lower right arm, 177/111 taken in left leg.  Anesthesia made aware, Dr. Vanstavren ok with Dc'ing pt home, will instruct pt to take medications when she gets home. Pt alert and oriented

## 2024-04-14 NOTE — Op Note (Signed)
 [facility] Gastroenterology Patient Name: Ann Silva Procedure Date: 04/14/2024 12:43 PM MRN: [id number] Account #: [id number] Date of Birth: Jul 23, 1945 Admit Type: Outpatient Age: 78 Room: [facility] ENDO ROOM 2 Gender: Female Note Status: Finalized Instrument Name: Colon Scope [id number] Procedure:             Colonoscopy Indications:           High risk colon cancer surveillance: Personal history                         of multiple (3 or more) adenomas, fecal urgency with                         diarrhea and incontinence Providers:             Shanae Luo K. Aundria MD, MD Referring MD:          [name], MD (Referring MD) Medicines:             Propofol  per Anesthesia Complications:         No immediate complications. Estimated blood loss:                         Minimal. Procedure:             Pre-Anesthesia Assessment:                        - The risks and benefits of the procedure and the                         sedation options and risks were discussed with the                         patient. All questions were answered and informed                         consent was obtained.                        - Patient identification and proposed procedure were                         verified prior to the procedure by the nurse. The                         procedure was verified in the procedure room.                        - ASA Grade Assessment: III - A patient with severe                         systemic disease.                        - After reviewing the risks and benefits, the patient                         was deemed in satisfactory condition to undergo the  procedure.                        After obtaining informed consent, the colonoscope was                         passed under direct vision. Throughout the procedure,                         the patient's blood pressure, pulse, and oxygen                          saturations were  monitored continuously. The                         Colonoscope was introduced through the anus and                         advanced to the the ileocolonic anastomosis. The                         colonoscopy was performed without difficulty. The                         patient tolerated the procedure well. The quality of                         the bowel preparation was good. Ileocolonic                         anastomosis and neoterminal ileum were photographed. Findings:      The perianal and digital rectal examinations were normal. Pertinent       negatives include normal sphincter tone and no palpable rectal lesions.      Many small-mouthed diverticula were found in the sigmoid colon.      Two sessile polyps were found in the mid transverse colon. The polyps       were 10 to 14 mm in size. These polyps were removed with a hot snare.       Resection and retrieval were complete. To prevent bleeding after the       polypectomy, two hemostatic clips were successfully placed (MR       conditional). Clip manufacturer: [employer]. There was no       bleeding during, or at the end, of the procedure.      A 5 mm polyp was found in the transverse colon. The polyp was sessile.       The polyp was removed with a cold snare. Resection and retrieval were       complete. Estimated blood loss was minimal.      An 11 mm polyp was found in the splenic flexure. The polyp was sessile.       The polyp was removed with a hot snare. Resection and retrieval were       complete. Estimated blood loss: none.      There was evidence of a prior end-to-side ileo-colonic anastomosis in       the transverse colon. This was patent and was characterized by healthy       appearing mucosa. The anastomosis was traversed.      The neo-terminal ileum appeared normal.  The exam was otherwise without abnormality on direct and retroflexion       views. Impression:            - Diverticulosis in the sigmoid  colon.                        - Two 10 to 14 mm polyps in the mid transverse colon,                         removed with a hot snare. Resected and retrieved.                         Clips (MR conditional) were placed. Clip manufacturer:                         [employer].                        - One 5 mm polyp in the transverse colon, removed with                         a cold snare. Resected and retrieved.                        - One 11 mm polyp at the splenic flexure, removed with                         a hot snare. Resected and retrieved.                        - Patent end-to-side ileo-colonic anastomosis,                         characterized by healthy appearing mucosa.                        - The examined portion of the ileum was normal.                        - The examination was otherwise normal on direct and                         retroflexion views. Recommendation:        - Patient has a contact number available for                         emergencies. The signs and symptoms of potential                         delayed complications were discussed with the patient.                         Return to normal activities tomorrow. Written                         discharge instructions were provided to the patient.                        - Resume previous  diet.                        - Continue present medications.                        - Repeat colonoscopy is recommended for surveillance.                         The colonoscopy date will be determined after                         pathology results from today's exam become available                         for review.                        - Return to GI office in 6 months.                        - The findings and recommendations were discussed with                         the patient. Procedure Code(s):     --- Professional ---                        [id number], Colonoscopy, flexible; with removal of                          tumor(s), polyp(s), or other lesion(s) by snare                         technique Diagnosis Code(s):     --- Professional ---                        K57.30, Diverticulosis of large intestine without                         perforation or abscess without bleeding                        Z98.0, Intestinal bypass and anastomosis status                        Z86.010, Personal history of colonic polyps                        D12.3, Benign neoplasm of transverse colon (hepatic                         flexure or splenic flexure) CPT copyright 2022 American [employer]. All rights reserved. The codes documented in this report are preliminary and upon coder review may  be revised to meet current compliance requirements. Ladell MARLA Boss MD, MD 04/14/2024 1:14:54 PM This report has been signed electronically. Number of Addenda: 0 Note Initiated On: 04/14/2024 12:43 PM Scope Withdrawal Time: 0 hours 14 minutes 9 seconds  Total Procedure Duration: 0 hours 19 minutes 51 seconds  Estimated Blood Loss:  Estimated blood loss was minimal. Estimated blood loss  was minimal.      [facility]

## 2024-04-14 NOTE — H&P (Signed)
 Outpatient short stay form Pre-procedure 04/14/2024 12:33 PM Ann Silva K. Aundria, M.D.  Primary Physician: Oneil Pinal, M.D.  Reason for visit:  History of colon cancer  Incontinence of feces with fecal urgency  Functional diarrhea   History of present illness:    Lumi is a 78 y.o. female who presents for New Patient and Diarrhea HPI History of Present Illness Ann Silva is a 78 year old female who presents with chronic diarrhea. She was referred by Dr. Pinal for evaluation of chronic diarrhea.  Chronic diarrhea - Diarrhea for the past three months, characterized by loose and watery stools occurring three to five times daily - Persistent diarrhea with occasional intermittent solid stools, but always returns to diarrhea by the end of the day - No blood in stools or gastrointestinal bleeding - Occasional small bowel movements at night, particularly when waking to urinate due to diuretic use  Gastrointestinal symptoms - Bloating and gassiness, improved with pantoprazole  - No significant abdominal pain, nausea, vomiting, reflux, heartburn, or regurgitation - Appetite is stable, eating two to three times a day - Weight loss of approximately three pounds over the past few months  Antibiotic exposure - History of sinus infections treated with antibiotics three times in the past six months - No recent antibiotic use for urinary tract infections  Urinary incontinence - Urinary incontinence with no control over urine - No kidney disease or hypertension-related kidney issues  Pulmonary and cardiovascular history - History of pulmonary embolism, currently taking Eliquis  5 mg daily - No chest pain, palpitations, shortness of breath, or visual changes  Chronic obstructive pulmonary disease (copd) - History of COPD, well-controlled - Does not use CPAP for sleep apnea     Current Facility-Administered Medications:    0.9 %  sodium chloride  infusion, , Intravenous, Continuous,  Manoah Deckard K, MD, Last Rate: 20 mL/hr at 04/14/24 1229, Continued from Pre-op at 04/14/24 1229  Medications Prior to Admission  Medication Sig Dispense Refill Last Dose/Taking   apixaban  (ELIQUIS ) 5 MG TABS tablet Take 5 mg by mouth 2 (two) times daily.   04/11/2024   acetaminophen  (TYLENOL ) 500 MG tablet Take 1,000 mg by mouth every 6 (six) hours as needed for moderate pain or headache.      albuterol  (PROVENTIL  HFA;VENTOLIN  HFA) 108 (90 Base) MCG/ACT inhaler Inhale 2 puffs into the lungs every 4 (four) hours as needed for wheezing or shortness of breath. 1 Inhaler 0    allopurinol  (ZYLOPRIM ) 100 MG tablet Take 1 tablet (100 mg total) by mouth daily. 30 tablet 0    ARIPiprazole  (ABILIFY ) 5 MG tablet Take 5 mg by mouth at bedtime.      buPROPion  (WELLBUTRIN ) 75 MG tablet Take 75 mg by mouth 2 (two) times daily. Unsure of dose      cholecalciferol  (CHOLECALCIFEROL ) 25 MCG tablet Take 2 tablets (2,000 Units total) by mouth daily. 30 tablet 0    colchicine  0.6 MG tablet Take 1 tablet (0.6 mg total) by mouth daily. 30 tablet 0    montelukast  (SINGULAIR ) 10 MG tablet Take 10 mg by mouth daily.      omeprazole (PRILOSEC) 40 MG capsule Take 40 mg by mouth daily.      ondansetron  (ZOFRAN -ODT) 4 MG disintegrating tablet Take 1 tablet (4 mg total) by mouth every 8 (eight) hours as needed for nausea or vomiting. 20 tablet 0    oxybutynin  (DITROPAN ) 5 MG tablet Take 5 mg by mouth 2 (two) times daily.      PARoxetine  (  PAXIL ) 40 MG tablet Take 40 mg by mouth daily at 2 PM.       polyethylene glycol (MIRALAX  / GLYCOLAX ) 17 g packet Take 17 g by mouth daily as needed for mild constipation. 14 each 0    pramipexole  (MIRAPEX ) 1 MG tablet Take 1 mg by mouth 3 (three) times daily.      spironolactone  (ALDACTONE ) 25 MG tablet Take 25 mg by mouth daily.      temazepam  (RESTORIL ) 30 MG capsule Take 30 mg by mouth at bedtime as needed.      tiotropium (SPIRIVA ) 18 MCG inhalation capsule Place 18 mcg into inhaler  and inhale daily.      torsemide  (DEMADEX ) 20 MG tablet Take 1 tablet (20 mg total) by mouth daily.        Allergies  Allergen Reactions   Hydrocodone-Chlorpheniramine Shortness Of Breath   Sulfa Antibiotics Shortness Of Breath   Sulfa Antibiotics Shortness Of Breath   Chlorhexidine  Rash   Amoxicillin-Pot Clavulanate Diarrhea   Methadone Other (See Comments)    Altered Mental Status   Benadryl  [Diphenhydramine ] Anxiety   Latex Rash   Tape Rash    the white kind they use after surgery     Past Medical History:  Diagnosis Date   Anemia    takes ferrous sulfate  daily   Anxiety    Arthritis    Bladder spasm    Chronic back pain    spinal stenosis   Colon cancer (HCC)    Complication of anesthesia    B/P bottoms out, hard to wake up    Contact dermatitis    COPD (chronic obstructive pulmonary disease) (HCC)    Albuterol  inhaler and DuoNeb as needed   COPD (chronic obstructive pulmonary disease) (HCC)    Coronary artery disease    mild by 08/25/09 cath   Foot drop, left    Headache    Heart murmur    never had any problems   History of blood transfusion    no abnormal reaction noted   History of bronchitis    History of MRSA infection 6+ yrs ago   Hypertension    takes Metoprolol  daily   Hypertension    Insomnia    takes Trazodone  nightly   Joint pain    Joint swelling    Lower extremity edema    Nocturia    Panic attacks    takes Effexor daily   Peripheral edema    takes Lasix  daily and takes Bumex  every 3 days.Takes Aldactone  daily    Pulmonary emboli (HCC) 2018   Restless leg syndrome    takes Requip  daily   RLS (restless legs syndrome)    Sleep apnea    doesnt use CPAP    Review of systems:  Otherwise negative.    Physical Exam  Gen: Alert, oriented. Appears stated age.  HEENT: [place]/AT. PERRLA. Lungs: CTA, no wheezes. CV: RR nl S1, S2. Abd: soft, benign, no masses. BS+ Ext: No edema. Pulses 2+    Planned procedures: Proceed with  colonoscopy. The patient understands the nature of the planned procedure, indications, risks, alternatives and potential complications including but not limited to bleeding, infection, perforation, damage to internal organs and possible oversedation/side effects from anesthesia. The patient agrees and gives consent to proceed.  Please refer to procedure notes for findings, recommendations and patient disposition/instructions.     Severina Sykora K. Aundria, M.D. Gastroenterology 04/14/2024  12:33 PM

## 2024-04-14 NOTE — Transfer of Care (Signed)
 Immediate Anesthesia Transfer of Care Note  Patient: Ann Silva  Procedure(s) Performed: COLONOSCOPY POLYPECTOMY, INTESTINE CONTROL OF HEMORRHAGE, GI TRACT, ENDOSCOPIC, BY CLIPPING OR OVERSEWING  Patient Location: PACU  Anesthesia Type:General  Level of Consciousness: sedated  Airway & Oxygen  Therapy: Patient Spontanous Breathing  Post-op Assessment: Report given to RN and Post -op Vital signs reviewed and stable  Post vital signs: Reviewed and stable  Last Vitals:  Vitals Value Taken Time  BP 129/67 04/14/24 13:14  Temp    Pulse 84 04/14/24 13:13  Resp 20 04/14/24 13:14  SpO2 95 % 04/14/24 13:13  Vitals shown include unfiled device data.  Last Pain:  Vitals:   04/14/24 1153  TempSrc: Temporal  PainSc: 3          Complications: No notable events documented.

## 2024-04-14 NOTE — Anesthesia Preprocedure Evaluation (Signed)
 Anesthesia Evaluation  Patient identified by MRN, date of birth, ID band Patient awake    Reviewed: Allergy & Precautions, NPO status , Patient's Chart, lab work & pertinent test results  Airway Mallampati: II  TM Distance: >3 FB Neck ROM: full    Dental  (+) Teeth Intact   Pulmonary neg pulmonary ROS, shortness of breath and with exertion, sleep apnea , COPD, former smoker   Pulmonary exam normal  + decreased breath sounds      Cardiovascular Exercise Tolerance: Good hypertension, Pt. on medications + CAD  negative cardio ROS Normal cardiovascular exam Rhythm:Regular Rate:Normal     Neuro/Psych   Anxiety     negative neurological ROS  negative psych ROS   GI/Hepatic negative GI ROS, Neg liver ROS,,,  Endo/Other  negative endocrine ROS  Class 3 obesity  Renal/GU negative Renal ROS  negative genitourinary   Musculoskeletal   Abdominal  (+) + obese  Peds  Hematology negative hematology ROS (+)   Anesthesia Other Findings Past Medical History: No date: Anemia     Comment:  takes ferrous sulfate  daily No date: Anxiety No date: Arthritis No date: Bladder spasm No date: Chronic back pain     Comment:  spinal stenosis No date: Colon cancer (HCC) No date: Complication of anesthesia     Comment:  B/P bottoms out, hard to wake up  No date: Contact dermatitis No date: COPD (chronic obstructive pulmonary disease) (HCC)     Comment:  Albuterol  inhaler and DuoNeb as needed No date: COPD (chronic obstructive pulmonary disease) (HCC) No date: Coronary artery disease     Comment:  mild by 08/25/09 cath No date: Foot drop, left No date: Headache No date: Heart murmur     Comment:  never had any problems No date: History of blood transfusion     Comment:  no abnormal reaction noted No date: History of bronchitis 6+ yrs ago: History of MRSA infection No date: Hypertension     Comment:  takes Metoprolol  daily No date:  Hypertension No date: Insomnia     Comment:  takes Trazodone  nightly No date: Joint pain No date: Joint swelling No date: Lower extremity edema No date: Nocturia No date: Panic attacks     Comment:  takes Effexor daily No date: Peripheral edema     Comment:  takes Lasix  daily and takes Bumex  every 3 days.Takes               Aldactone  daily  2018: Pulmonary emboli (HCC) No date: Restless leg syndrome     Comment:  takes Requip  daily No date: RLS (restless legs syndrome) No date: Sleep apnea     Comment:  doesnt use CPAP  Past Surgical History: No date: ABDOMINAL HYSTERECTOMY No date: BACK SURGERY 1980's: BREAST EXCISIONAL BIOPSY; Left     Comment:  neg 10/09/2021: CATARACT EXTRACTION W/PHACO; Right     Comment:  Procedure: CATARACT EXTRACTION PHACO AND INTRAOCULAR               LENS PLACEMENT (IOC) RIGHT 23.94 01:52.0;  Surgeon:               Jaye Fallow, MD;  Location: [facility];                Service: Ophthalmology;  Laterality: Right; 10/23/2021: CATARACT EXTRACTION W/PHACO; Left     Comment:  Procedure: CATARACT EXTRACTION PHACO AND INTRAOCULAR               LENS  PLACEMENT (IOC) LEFT 7.34 00:47.4;  Surgeon:               Jaye Fallow, MD;  Location: [facility];                Service: Ophthalmology;  Laterality: Left; No date: CHOLECYSTECTOMY No date: COLON SURGERY     Comment:  partial colectomy d/t cancer cells No date: COLONOSCOPY 12/11/2020: HIP CLOSED REDUCTION; Left     Comment:  Procedure: CLOSED REDUCTION HIP;  Surgeon: Kathlynn Sharper, MD;  Location: [facility];  Service: Orthopedics;               Laterality: Left; No date: I&D of left hip      Comment:  x 4 03/08/2019: INTERSTIM IMPLANT PLACEMENT; N/A     Comment:  Procedure: INTERSTIM IMPLANT FIRST AND SECOND STAGE WITH              IMPEDENCE CHECK;  Surgeon: Gaston Hamilton, MD;                Location: [facility];  Service: Urology;  Laterality: N/A; No date: JOINT  REPLACEMENT 05/20/2022: PULMONARY THROMBECTOMY; Right     Comment:  Procedure: PULMONARY THROMBECTOMY;  Surgeon: Marea Selinda RAMAN, MD;  Location: [facility];  Service:               Cardiovascular;  Laterality: Right;  Bilateral               subsegmental No date: Resection total hip No date: Revision of total hip arthroplasty     Comment:  x3-4 No date: right hip replacement No date: TOTAL HIP ARTHROPLASTY; Left  BMI    Body Mass Index: 38.09 kg/m      Reproductive/Obstetrics negative OB ROS                              Anesthesia Physical Anesthesia Plan  ASA: 3  Anesthesia Plan: General   Post-op Pain Management:    Induction: Intravenous  PONV Risk Score and Plan: Propofol  infusion and TIVA  Airway Management Planned: Natural Airway and Nasal Cannula  Additional Equipment:   Intra-op Plan:   Post-operative Plan:   Informed Consent: I have reviewed the patients History and Physical, chart, labs and discussed the procedure including the risks, benefits and alternatives for the proposed anesthesia with the patient or authorized representative who has indicated his/her understanding and acceptance.     Dental Advisory Given  Plan Discussed with: CRNA  Anesthesia Plan Comments:         Anesthesia Quick Evaluation

## 2024-04-15 LAB — SURGICAL PATHOLOGY

## 2024-04-20 NOTE — Anesthesia Postprocedure Evaluation (Signed)
 Anesthesia Post Note  Patient: Ann Silva  Procedure(s) Performed: COLONOSCOPY POLYPECTOMY, INTESTINE CONTROL OF HEMORRHAGE, GI TRACT, ENDOSCOPIC, BY CLIPPING OR OVERSEWING  Patient location during evaluation: PACU Anesthesia Type: General Level of consciousness: awake Pain management: pain level controlled Vital Signs Assessment: post-procedure vital signs reviewed and stable Cardiovascular status: stable Anesthetic complications: no   No notable events documented.   Last Vitals:  Vitals:   04/14/24 1327 04/14/24 1334  BP: (!) 148/93 (!) 166/101  Pulse: 90   Resp: 19   Temp:    SpO2: 96%     Last Pain:  Vitals:   04/15/24 0903  TempSrc:   PainSc: 3                  VAN STAVEREN,[name]

## 2024-04-21 DIAGNOSIS — Z79899 Other long term (current) drug therapy: Secondary | ICD-10-CM | POA: Diagnosis not present

## 2024-04-21 DIAGNOSIS — E538 Deficiency of other specified B group vitamins: Secondary | ICD-10-CM | POA: Diagnosis not present

## 2024-04-21 DIAGNOSIS — R739 Hyperglycemia, unspecified: Secondary | ICD-10-CM | POA: Diagnosis not present

## 2024-04-22 DIAGNOSIS — E538 Deficiency of other specified B group vitamins: Secondary | ICD-10-CM | POA: Diagnosis not present

## 2024-04-22 DIAGNOSIS — R739 Hyperglycemia, unspecified: Secondary | ICD-10-CM | POA: Diagnosis not present

## 2024-04-22 DIAGNOSIS — Z79899 Other long term (current) drug therapy: Secondary | ICD-10-CM | POA: Diagnosis not present

## 2024-04-27 DIAGNOSIS — Z23 Encounter for immunization: Secondary | ICD-10-CM | POA: Diagnosis not present

## 2024-04-27 DIAGNOSIS — I509 Heart failure, unspecified: Secondary | ICD-10-CM | POA: Diagnosis not present

## 2024-04-27 DIAGNOSIS — I2781 Cor pulmonale (chronic): Secondary | ICD-10-CM | POA: Diagnosis not present

## 2024-04-27 DIAGNOSIS — E213 Hyperparathyroidism, unspecified: Secondary | ICD-10-CM | POA: Diagnosis not present

## 2024-04-27 DIAGNOSIS — Z79899 Other long term (current) drug therapy: Secondary | ICD-10-CM | POA: Diagnosis not present

## 2024-04-27 DIAGNOSIS — Z0000 Encounter for general adult medical examination without abnormal findings: Secondary | ICD-10-CM | POA: Diagnosis not present

## 2024-05-25 DIAGNOSIS — I5032 Chronic diastolic (congestive) heart failure: Secondary | ICD-10-CM | POA: Diagnosis not present

## 2024-05-25 DIAGNOSIS — J439 Emphysema, unspecified: Secondary | ICD-10-CM | POA: Diagnosis not present

## 2024-05-25 DIAGNOSIS — R0609 Other forms of dyspnea: Secondary | ICD-10-CM | POA: Diagnosis not present

## 2024-05-25 DIAGNOSIS — G4734 Idiopathic sleep related nonobstructive alveolar hypoventilation: Secondary | ICD-10-CM | POA: Diagnosis not present

## 2024-05-25 DIAGNOSIS — Z87891 Personal history of nicotine dependence: Secondary | ICD-10-CM | POA: Diagnosis not present
